# Patient Record
Sex: Male | Born: 1940 | Race: White | Hispanic: No | Marital: Married | Smoking: Never smoker
Health system: Southern US, Community
[De-identification: ages and names within clinical notes are randomized; demographics above are authoritative.]

## PROBLEM LIST (undated history)

## (undated) DIAGNOSIS — N400 Enlarged prostate without lower urinary tract symptoms: Secondary | ICD-10-CM

## (undated) DIAGNOSIS — C73 Malignant neoplasm of thyroid gland: Secondary | ICD-10-CM

## (undated) DIAGNOSIS — Z9289 Personal history of other medical treatment: Secondary | ICD-10-CM

## (undated) DIAGNOSIS — Z77098 Contact with and (suspected) exposure to other hazardous, chiefly nonmedicinal, chemicals: Secondary | ICD-10-CM

## (undated) DIAGNOSIS — C8590 Non-Hodgkin lymphoma, unspecified, unspecified site: Secondary | ICD-10-CM

## (undated) DIAGNOSIS — G4733 Obstructive sleep apnea (adult) (pediatric): Secondary | ICD-10-CM

## (undated) DIAGNOSIS — T884XXA Failed or difficult intubation, initial encounter: Secondary | ICD-10-CM

## (undated) DIAGNOSIS — I499 Cardiac arrhythmia, unspecified: Secondary | ICD-10-CM

## (undated) DIAGNOSIS — I059 Rheumatic mitral valve disease, unspecified: Secondary | ICD-10-CM

## (undated) DIAGNOSIS — Z7739 Contact with and (suspected) exposure to other war theater: Secondary | ICD-10-CM

## (undated) DIAGNOSIS — I4892 Unspecified atrial flutter: Secondary | ICD-10-CM

## (undated) DIAGNOSIS — E7800 Pure hypercholesterolemia, unspecified: Secondary | ICD-10-CM

## (undated) DIAGNOSIS — I712 Thoracic aortic aneurysm, without rupture: Secondary | ICD-10-CM

## (undated) DIAGNOSIS — R17 Unspecified jaundice: Secondary | ICD-10-CM

## (undated) DIAGNOSIS — I728 Aneurysm of other specified arteries: Secondary | ICD-10-CM

## (undated) DIAGNOSIS — G5793 Unspecified mononeuropathy of bilateral lower limbs: Secondary | ICD-10-CM

## (undated) DIAGNOSIS — I722 Aneurysm of renal artery: Secondary | ICD-10-CM

## (undated) DIAGNOSIS — R519 Headache, unspecified: Secondary | ICD-10-CM

## (undated) DIAGNOSIS — J189 Pneumonia, unspecified organism: Secondary | ICD-10-CM

## (undated) DIAGNOSIS — I7121 Aneurysm of the ascending aorta, without rupture: Secondary | ICD-10-CM

## (undated) DIAGNOSIS — R51 Headache: Secondary | ICD-10-CM

## (undated) DIAGNOSIS — I713 Abdominal aortic aneurysm, ruptured: Secondary | ICD-10-CM

## (undated) DIAGNOSIS — C801 Malignant (primary) neoplasm, unspecified: Secondary | ICD-10-CM

## (undated) DIAGNOSIS — G629 Polyneuropathy, unspecified: Secondary | ICD-10-CM

## (undated) HISTORY — DX: Headache, unspecified: R51.9

## (undated) HISTORY — DX: Benign prostatic hyperplasia without lower urinary tract symptoms: N40.0

## (undated) HISTORY — PX: COLONOSCOPY: SHX174

## (undated) HISTORY — DX: Polyneuropathy, unspecified: G62.9

## (undated) HISTORY — DX: Headache: R51

## (undated) HISTORY — PX: OTHER SURGICAL HISTORY: SHX169

## (undated) HISTORY — PX: ESOPHAGOGASTRODUODENOSCOPY: SHX1529

## (undated) HISTORY — DX: Obstructive sleep apnea (adult) (pediatric): G47.33

## (undated) HISTORY — DX: Rheumatic mitral valve disease, unspecified: I05.9

## (undated) HISTORY — PX: CHOLECYSTECTOMY: SHX55

## (undated) HISTORY — DX: Pure hypercholesterolemia, unspecified: E78.00

---
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
## 1998-08-09 ENCOUNTER — Ambulatory Visit (HOSPITAL_COMMUNITY): Admission: RE | Admit: 1998-08-09 | Discharge: 1998-08-09 | Payer: Self-pay | Admitting: Gastroenterology

## 2000-03-25 HISTORY — PX: MITRAL VALVE REPAIR: SHX2039

## 2000-07-22 ENCOUNTER — Ambulatory Visit (HOSPITAL_COMMUNITY): Admission: RE | Admit: 2000-07-22 | Discharge: 2000-07-22 | Payer: Self-pay | Admitting: Interventional Cardiology

## 2000-09-02 ENCOUNTER — Ambulatory Visit (HOSPITAL_COMMUNITY): Admission: RE | Admit: 2000-09-02 | Discharge: 2000-09-02 | Payer: Self-pay | Admitting: *Deleted

## 2001-01-20 ENCOUNTER — Encounter (HOSPITAL_COMMUNITY): Admission: RE | Admit: 2001-01-20 | Discharge: 2001-04-20 | Payer: Self-pay | Admitting: Interventional Cardiology

## 2002-04-06 ENCOUNTER — Encounter (HOSPITAL_COMMUNITY): Admission: RE | Admit: 2002-04-06 | Discharge: 2002-07-05 | Payer: Self-pay | Admitting: Interventional Cardiology

## 2002-07-24 ENCOUNTER — Encounter (HOSPITAL_COMMUNITY): Admission: RE | Admit: 2002-07-24 | Discharge: 2002-10-22 | Payer: Self-pay | Admitting: Interventional Cardiology

## 2002-10-24 ENCOUNTER — Encounter (HOSPITAL_COMMUNITY): Admission: RE | Admit: 2002-10-24 | Discharge: 2003-01-22 | Payer: Self-pay | Admitting: Interventional Cardiology

## 2003-01-24 ENCOUNTER — Encounter (HOSPITAL_COMMUNITY): Admission: RE | Admit: 2003-01-24 | Discharge: 2003-04-24 | Payer: Self-pay | Admitting: Interventional Cardiology

## 2003-04-26 ENCOUNTER — Encounter (HOSPITAL_COMMUNITY): Admission: RE | Admit: 2003-04-26 | Discharge: 2003-07-25 | Payer: Self-pay | Admitting: Interventional Cardiology

## 2003-07-26 ENCOUNTER — Encounter (HOSPITAL_COMMUNITY): Admission: RE | Admit: 2003-07-26 | Discharge: 2003-10-24 | Payer: Self-pay | Admitting: Interventional Cardiology

## 2003-10-25 ENCOUNTER — Encounter (HOSPITAL_COMMUNITY): Admission: RE | Admit: 2003-10-25 | Discharge: 2004-01-23 | Payer: Self-pay | Admitting: Interventional Cardiology

## 2004-01-24 ENCOUNTER — Encounter (HOSPITAL_COMMUNITY): Admission: RE | Admit: 2004-01-24 | Discharge: 2004-04-23 | Payer: Self-pay | Admitting: Interventional Cardiology

## 2004-02-14 ENCOUNTER — Emergency Department (HOSPITAL_COMMUNITY): Admission: EM | Admit: 2004-02-14 | Discharge: 2004-02-14 | Payer: Self-pay | Admitting: Emergency Medicine

## 2004-04-25 ENCOUNTER — Encounter (HOSPITAL_COMMUNITY): Admission: RE | Admit: 2004-04-25 | Discharge: 2004-07-24 | Payer: Self-pay | Admitting: Interventional Cardiology

## 2004-06-15 ENCOUNTER — Encounter: Admission: RE | Admit: 2004-06-15 | Discharge: 2004-06-15 | Payer: Self-pay | Admitting: Cardiology

## 2004-06-19 ENCOUNTER — Ambulatory Visit (HOSPITAL_COMMUNITY): Admission: RE | Admit: 2004-06-19 | Discharge: 2004-06-19 | Payer: Self-pay | Admitting: Cardiology

## 2004-06-19 HISTORY — PX: RIGHT HEART CATH: SHX6075

## 2004-07-25 ENCOUNTER — Encounter (HOSPITAL_COMMUNITY): Admission: RE | Admit: 2004-07-25 | Discharge: 2004-10-23 | Payer: Self-pay | Admitting: Interventional Cardiology

## 2004-10-24 ENCOUNTER — Encounter (HOSPITAL_COMMUNITY): Admission: RE | Admit: 2004-10-24 | Discharge: 2005-01-22 | Payer: Self-pay | Admitting: Interventional Cardiology

## 2005-01-23 ENCOUNTER — Encounter (HOSPITAL_COMMUNITY): Admission: RE | Admit: 2005-01-23 | Discharge: 2005-04-23 | Payer: Self-pay | Admitting: Internal Medicine

## 2005-04-25 ENCOUNTER — Encounter (HOSPITAL_COMMUNITY): Admission: RE | Admit: 2005-04-25 | Discharge: 2005-07-24 | Payer: Self-pay | Admitting: Interventional Cardiology

## 2005-07-25 ENCOUNTER — Encounter (HOSPITAL_COMMUNITY): Admission: RE | Admit: 2005-07-25 | Discharge: 2005-10-23 | Payer: Self-pay | Admitting: Cardiology

## 2005-10-24 ENCOUNTER — Encounter (HOSPITAL_COMMUNITY): Admission: RE | Admit: 2005-10-24 | Discharge: 2006-01-22 | Payer: Self-pay | Admitting: Cardiology

## 2006-01-23 ENCOUNTER — Encounter (HOSPITAL_COMMUNITY): Admission: RE | Admit: 2006-01-23 | Discharge: 2006-04-23 | Payer: Self-pay | Admitting: Cardiology

## 2006-04-25 ENCOUNTER — Encounter (HOSPITAL_COMMUNITY): Admission: RE | Admit: 2006-04-25 | Discharge: 2006-07-24 | Payer: Self-pay | Admitting: Cardiology

## 2006-07-22 HISTORY — PX: NM MYOVIEW LTD: HXRAD82

## 2006-07-25 ENCOUNTER — Encounter (HOSPITAL_COMMUNITY): Admission: RE | Admit: 2006-07-25 | Discharge: 2006-10-23 | Payer: Self-pay | Admitting: Cardiology

## 2006-10-24 ENCOUNTER — Encounter (HOSPITAL_COMMUNITY): Admission: RE | Admit: 2006-10-24 | Discharge: 2007-01-22 | Payer: Self-pay | Admitting: Cardiology

## 2007-01-24 ENCOUNTER — Encounter (HOSPITAL_COMMUNITY): Admission: RE | Admit: 2007-01-24 | Discharge: 2007-03-24 | Payer: Self-pay | Admitting: Cardiology

## 2007-03-26 HISTORY — PX: MENISCUS REPAIR: SHX5179

## 2008-09-01 ENCOUNTER — Encounter: Admission: RE | Admit: 2008-09-01 | Discharge: 2008-09-01 | Payer: Self-pay | Admitting: Internal Medicine

## 2008-11-12 ENCOUNTER — Encounter: Admission: RE | Admit: 2008-11-12 | Discharge: 2008-11-12 | Payer: Self-pay | Admitting: Internal Medicine

## 2009-11-08 ENCOUNTER — Ambulatory Visit (HOSPITAL_BASED_OUTPATIENT_CLINIC_OR_DEPARTMENT_OTHER): Admission: RE | Admit: 2009-11-08 | Discharge: 2009-11-08 | Payer: Self-pay | Admitting: Orthopedic Surgery

## 2010-06-08 LAB — POCT I-STAT, CHEM 8
BUN: 20 mg/dL (ref 6–23)
Calcium, Ion: 1.19 mmol/L (ref 1.12–1.32)
Chloride: 106 mEq/L (ref 96–112)
Creatinine, Ser: 1.1 mg/dL (ref 0.4–1.5)
Glucose, Bld: 88 mg/dL (ref 70–99)
HCT: 43 % (ref 39.0–52.0)
Hemoglobin: 14.6 g/dL (ref 13.0–17.0)
Potassium: 4 mEq/L (ref 3.5–5.1)
Sodium: 140 mEq/L (ref 135–145)
TCO2: 25 mmol/L (ref 0–100)

## 2010-08-10 NOTE — Cardiovascular Report (Signed)
Sean Stanley, Sean Stanley                 ACCOUNT NO.:  [id number]   MEDICAL RECORD NO.:  [id number]          PATIENT TYPE:  OIB   LOCATION:  2899                         FACILITY:  [facility]   PHYSICIAN:  Madaline Savage, M.D.DATE OF BIRTH:  [birth year]   DATE OF PROCEDURE:  06/19/2004  DATE OF DISCHARGE:                              CARDIAC CATHETERIZATION   PROCEDURES PERFORMED:  1.  Right heart catheterization.  2.  Retrograde left heart catheterization.  3.  Left ventricular angiography.  4.  Selective coronary angiography by Judkins technique.  5.  Thermodilution and Fick cardiac output determinations.   INTERVENTIONS:  None.   COMPLICATIONS:  None.   MEDICATIONS GIVEN:  Versed and Fentanyl for sedation   PATIENT PROFILE:  The patient is a very pleasant 70 year old retired  gentleman with a military background who has mitral valve disease and who  also underwent a minimally invasive mitral valve repair by Dr. Judd Gaudier  at [facility] in 2002.  His left ventricular ejection  fraction before surgery was 65-70%.  Following surgery, it measures 51% both  by echocardiography.  The patient is maintained on aspirin and Toprol, and  also doxazosin 4 mg a day.   The patient recently had a Cardiolite stress test.  The date of that  procedure was May 02, 2004. He had an ejection fraction calculation of  51%, and there was said to be mild inferior wall ischemia at the base.  There was also evidence of anterolateral wall ischemia from the mid  ventricle toward the apex. Because of the ejection fraction difference of 65  versus 51 and the abnormal Cardiolite results, I recommended cardiac  catheterization which was performed today electively on an outpatient basis  without complication.   RESULTS:  Pressures:  Left ventricular pressure was 130/5, end-diastolic  pressure 12, central aortic pressure 130/65, mean of 95.  Right atrial mean  pressure was 5.  A-wave  equal V-wave.  Right ventricular pressure 18/4, end-  diastolic pressure 7.  Pulmonary artery pressure 14/5, end-diastolic  pressure mean pressure 8. Pulmonary capillary wedge mean pressure was 6, A-  wave equal V-wave.  No aortic valve gradient by pullback technique.  The  mitral valve area will be calculated at a later time.   ANGIOGRAPHIC RESULTS:  The patient's left ventricular angiogram showed a low  normal contractility of all wall segments with no segmental wall motion  abnormalities seen.  The mitral valve was unremarkable in terms of absence  of calcifications. There was a smooth contour of the mitral valve outlined  in the 30 degrees RAO projection, and there was zero mitral regurgitation.   The left main coronary artery was large, medium in length and showed no  lesions.   The left anterior descending coronary artery coursed the cardiac apex with  some tortuosity in its mid portion. There was a bifurcating large diagonal  branch arising well before the first septal perforator branch and the LAD  and diagonal branch were both normal.   The circumflex coronary artery was nondominant but gave rise to a  posterolateral  branch and a large atrial circumflex branch. No lesions were  seen. There was a large intermediate ramus branch which bifurcated distally  and was normal.   The right coronary artery was dominant giving rise to a posterior descending  branch. It gave rise to one very large acute marginal branch. No lesions  were seen.   FINAL RESULTS:  1.  Angiographically patent coronary arteries. No coronary disease noted.  2.  False positive Cardiolite stress test.  3.  LVEF 50% estimated.  4.  Normal right heart hemodynamics.  5.  Normal cardiac output determinations.   PLAN:  Reassurance. The patient will recover for 4 hours and then be  discharged and will follow up in our office for further discussion of  results.      WHG/MEDQ  D:  06/19/2004  T:  06/19/2004   Job:  [id number]   cc:   Olene Craven, M.D.  [address]  [address]  [place]  [place] [address]  Fax: [phone]   [facility] Judd Gaudier M.D. Dept of CVTS   Madaline Savage, M.D.  [address], [address]  [place]  [place] [address]  Fax: [phone]

## 2010-08-10 NOTE — Cardiovascular Report (Signed)
[place]. [facility]  Patient:    Sean Stanley, Sean Stanley                        MRN: [id number] Proc. Date: 07/22/00 Adm. Date:  [id number] Attending:  Lyn Records. Iii                        Cardiac Catheterization  INDICATION:  Mitral regurgitation and left ventricular dysfunction.  PROCEDURES PERFORMED: 1. Left heart catheterization. 2. Right heart catheterization. 3. Coronary angiography. 4. Left ventriculography.  DESCRIPTION:  After informed consent, a 6-French sheath was placed in the right femoral artery and an 8-French sheath in the right femoral vein.  We experienced some difficulty cannulating the right femoral vein.  It appeared to be more medially than usually identified.  After local Xylocaine anesthesia, the catheterization was performed using a 7-French Swan-Ganz catheter, a 6-French angled pigtail catheter, a 6-French #4 left Judkins catheter, and a 6-French A2 multipurpose catheter.  The patient tolerated the procedure without significant problems.  Initially, right heart hemodynamics were determined followed by thermodilution cardiac outputs.  Simultaneous wedge LV pressure was then recorded.  A pullback pressure across the aortic valve was recorded after left ventriculography had been performed.  Left ventriculography was performed using the angled pigtail catheter in the 30-degree ROA projection at 12 cc of contrast per second for a total of 45 cc of contrast.  After pullback pressure across the aortic valve, the Swan-Ganz catheter was then pulled back to the right heart from wedge to inferior vena cava while recording.  The patient tolerated the procedure without complications after completion of coronary angiography.  RESULTS: I.   Hemodynamic data:      a. Aortic pressure 142/76 mmHg.      b. Left ventricular pressure 143/17 mmHg.      c. Right atrial mean pressure 10.      d. Right ventricular pressure 29/13.      e. Pulmonary  artery pressure mean 22 mmHg, phasic 30/15.      f. Pulmonary capillary wedge mean pressure 13 mmHg. II.  Left ventriculography:  The left ventricle is mildly dilated.  There      was mild global hypokinesis.  EF was estimated to be 55%.  A 2+ mitral      regurgitation is noted. III. Coronary angiography.      a. Left main coronary: Normal.      b. Left anterior descending coronary: The LAD is normal.  It is large         and gives origin to one diagonal that wraps around the left         ventricular apex.      c. Ramus branch: Large ramus branch arises from the left main and is         normal.      d. Circumflex artery: The circumflex artery is normal.      e. Right coronary:  The right coronary is normal.  CONCLUSIONS: 1. Moderate mitral regurgitation. 2. Low-normal left ventricular ejection fraction.  Wall motion is globally    reduced. 3. Normal coronary arteries.  RECOMMENDATIONS:  Continued medical therapy and clinical followup.  With normal right heart hemodynamics, it does not appear that surgery for mitral regurgitation is clearly indicated at this time. DD:  07/22/00 TD:  07/22/00 Job: [id number]

## 2010-08-10 NOTE — Procedures (Signed)
[place]. [facility]  Patient:    Sean Stanley, Sean Stanley                        MRN: [id number] Adm. Date:  [id number] Attending:  Meade Maw A CC:         Darci Needle, M.D.   Procedure Report  REFERRING PHYSICIAN:  Darci Needle, M.D.  INDICATION FOR PROCEDURE:  Evaluation of mitral regurgitation.  DESCRIPTION OF PROCEDURE:  After obtaining written informed consent, the patient was brought to the endoscopy lab in the postabsorptive state.  Topical anesthesia was achieved using viscous lidocaine and Cetacaine spray.  IV sedation was achieved using Versed 8 mg IV and fentanyl 100 mcg.  Following appropriate sedation, the omniplane probe was introduced using digital guidance without difficulty.  Multiple views were obtained of the midesophageal, basal, and deep gastric view.  The ascending and descending aortic arch were visualized.  Color flow Doppler was performed across the mitral, tricuspid, and aortic valve.  Bubble study was performed.  FINDINGS: 1. There was mild prolapse of the anterior and posterior leaflet.  There was    2+ mitral regurgitation, which was eccentrically anteriorly directed.  The    anterior jet did approach the level of the right pulmonary vein.  The    mitral inflow VTI was 19.8.  The aortic flow VTI was 19.1.  There was mild    left atrial enlargement. 2. Mild diffuse thickening of the aortic valve.  There was no significant    stenosis. 3. Grossly normal tricuspid valve. 4. The pulmonic valve was poorly visualized. 4. Grossly normal LV dimension and function, ejection fraction of    approximately 65-70%. 5. There was mild plaque noted in the ascending aorta. 6. The left atrial appendage was within normal limits.  Negative bubble study.  FINAL IMPRESSION: 1. Mild to moderate eccentrically-directed anterior mitral regurgitation    associated with mild prolapse of the anterior-posterior leaflet. The ratio    of the  mitral inflow with the aortic inflow VTI is approximately 1.  This  number is grossly normal. 2. Grossly normal left ventricular dimension and function, ejection fraction    of 65-70%. 3. Mild plaque in the ascending aorta. DD:  09/02/00 TD:  09/02/00 Job: [id number]

## 2011-01-15 ENCOUNTER — Encounter (INDEPENDENT_AMBULATORY_CARE_PROVIDER_SITE_OTHER): Payer: Medicare Other | Admitting: Ophthalmology

## 2011-01-15 DIAGNOSIS — H2510 Age-related nuclear cataract, unspecified eye: Secondary | ICD-10-CM

## 2011-01-15 DIAGNOSIS — H35419 Lattice degeneration of retina, unspecified eye: Secondary | ICD-10-CM

## 2011-01-15 DIAGNOSIS — H33309 Unspecified retinal break, unspecified eye: Secondary | ICD-10-CM

## 2011-01-15 DIAGNOSIS — H43819 Vitreous degeneration, unspecified eye: Secondary | ICD-10-CM

## 2011-03-29 DIAGNOSIS — M7750 Other enthesopathy of unspecified foot: Secondary | ICD-10-CM | POA: Diagnosis not present

## 2011-04-02 DIAGNOSIS — M659 Synovitis and tenosynovitis, unspecified: Secondary | ICD-10-CM | POA: Diagnosis not present

## 2011-04-02 DIAGNOSIS — M25579 Pain in unspecified ankle and joints of unspecified foot: Secondary | ICD-10-CM | POA: Diagnosis not present

## 2011-05-17 DIAGNOSIS — Z79899 Other long term (current) drug therapy: Secondary | ICD-10-CM | POA: Diagnosis not present

## 2011-05-17 DIAGNOSIS — E782 Mixed hyperlipidemia: Secondary | ICD-10-CM | POA: Diagnosis not present

## 2011-05-30 DIAGNOSIS — I10 Essential (primary) hypertension: Secondary | ICD-10-CM | POA: Diagnosis not present

## 2011-05-30 DIAGNOSIS — E782 Mixed hyperlipidemia: Secondary | ICD-10-CM | POA: Diagnosis not present

## 2011-06-12 DIAGNOSIS — M659 Synovitis and tenosynovitis, unspecified: Secondary | ICD-10-CM | POA: Diagnosis not present

## 2011-06-20 DIAGNOSIS — N400 Enlarged prostate without lower urinary tract symptoms: Secondary | ICD-10-CM | POA: Diagnosis not present

## 2011-06-20 DIAGNOSIS — N529 Male erectile dysfunction, unspecified: Secondary | ICD-10-CM | POA: Diagnosis not present

## 2011-09-17 DIAGNOSIS — H2510 Age-related nuclear cataract, unspecified eye: Secondary | ICD-10-CM | POA: Diagnosis not present

## 2011-09-17 DIAGNOSIS — H40029 Open angle with borderline findings, high risk, unspecified eye: Secondary | ICD-10-CM | POA: Diagnosis not present

## 2011-09-19 DIAGNOSIS — L255 Unspecified contact dermatitis due to plants, except food: Secondary | ICD-10-CM | POA: Diagnosis not present

## 2011-11-22 DIAGNOSIS — Z131 Encounter for screening for diabetes mellitus: Secondary | ICD-10-CM | POA: Diagnosis not present

## 2011-11-22 DIAGNOSIS — Z1331 Encounter for screening for depression: Secondary | ICD-10-CM | POA: Diagnosis not present

## 2011-11-22 DIAGNOSIS — Z0000 Encounter for general adult medical examination without abnormal findings: Secondary | ICD-10-CM | POA: Diagnosis not present

## 2012-01-03 DIAGNOSIS — Z23 Encounter for immunization: Secondary | ICD-10-CM | POA: Diagnosis not present

## 2012-01-16 ENCOUNTER — Encounter (INDEPENDENT_AMBULATORY_CARE_PROVIDER_SITE_OTHER): Payer: Medicare Other | Admitting: Ophthalmology

## 2012-01-16 DIAGNOSIS — H35419 Lattice degeneration of retina, unspecified eye: Secondary | ICD-10-CM | POA: Diagnosis not present

## 2012-01-16 DIAGNOSIS — H43819 Vitreous degeneration, unspecified eye: Secondary | ICD-10-CM

## 2012-01-16 DIAGNOSIS — H2510 Age-related nuclear cataract, unspecified eye: Secondary | ICD-10-CM | POA: Diagnosis not present

## 2012-01-16 DIAGNOSIS — H33309 Unspecified retinal break, unspecified eye: Secondary | ICD-10-CM | POA: Diagnosis not present

## 2012-01-30 ENCOUNTER — Ambulatory Visit (INDEPENDENT_AMBULATORY_CARE_PROVIDER_SITE_OTHER): Payer: Medicare Other | Admitting: Ophthalmology

## 2012-01-30 DIAGNOSIS — H33309 Unspecified retinal break, unspecified eye: Secondary | ICD-10-CM

## 2012-02-03 ENCOUNTER — Other Ambulatory Visit: Payer: Self-pay | Admitting: Internal Medicine

## 2012-02-03 DIAGNOSIS — E782 Mixed hyperlipidemia: Secondary | ICD-10-CM | POA: Diagnosis not present

## 2012-02-03 DIAGNOSIS — I059 Rheumatic mitral valve disease, unspecified: Secondary | ICD-10-CM | POA: Diagnosis not present

## 2012-02-03 DIAGNOSIS — I4891 Unspecified atrial fibrillation: Secondary | ICD-10-CM | POA: Diagnosis not present

## 2012-02-06 ENCOUNTER — Ambulatory Visit
Admission: RE | Admit: 2012-02-06 | Discharge: 2012-02-06 | Disposition: A | Discharge status: Home / Self Care | Payer: Medicare Other | Source: Ambulatory Visit | Attending: Cardiology | Admitting: Cardiology

## 2012-02-06 ENCOUNTER — Other Ambulatory Visit: Payer: Self-pay | Admitting: Cardiology

## 2012-02-06 DIAGNOSIS — Z01818 Encounter for other preprocedural examination: Secondary | ICD-10-CM | POA: Diagnosis not present

## 2012-02-06 DIAGNOSIS — R0602 Shortness of breath: Secondary | ICD-10-CM

## 2012-02-06 DIAGNOSIS — R002 Palpitations: Secondary | ICD-10-CM | POA: Diagnosis not present

## 2012-02-06 DIAGNOSIS — R5383 Other fatigue: Secondary | ICD-10-CM | POA: Diagnosis not present

## 2012-02-06 DIAGNOSIS — R5381 Other malaise: Secondary | ICD-10-CM | POA: Diagnosis not present

## 2012-02-06 DIAGNOSIS — J984 Other disorders of lung: Secondary | ICD-10-CM | POA: Diagnosis not present

## 2012-02-06 DIAGNOSIS — Z79899 Other long term (current) drug therapy: Secondary | ICD-10-CM | POA: Diagnosis not present

## 2012-02-07 ENCOUNTER — Other Ambulatory Visit: Payer: Self-pay | Admitting: Internal Medicine

## 2012-02-12 ENCOUNTER — Ambulatory Visit (HOSPITAL_COMMUNITY)
Admission: RE | Admit: 2012-02-12 | Discharge: 2012-02-12 | Disposition: A | Discharge status: Home / Self Care | Payer: Medicare Other | Source: Ambulatory Visit | Attending: Internal Medicine | Admitting: Internal Medicine

## 2012-02-12 ENCOUNTER — Ambulatory Visit (HOSPITAL_COMMUNITY): Payer: Medicare Other | Admitting: *Deleted

## 2012-02-12 ENCOUNTER — Encounter (HOSPITAL_COMMUNITY): Payer: Self-pay | Admitting: *Deleted

## 2012-02-12 ENCOUNTER — Encounter (HOSPITAL_COMMUNITY): Payer: Self-pay

## 2012-02-12 ENCOUNTER — Encounter (HOSPITAL_COMMUNITY)
Admission: RE | Disposition: A | Discharge status: Home / Self Care | Payer: Self-pay | Source: Ambulatory Visit | Attending: Internal Medicine

## 2012-02-12 DIAGNOSIS — I4891 Unspecified atrial fibrillation: Secondary | ICD-10-CM | POA: Diagnosis not present

## 2012-02-12 DIAGNOSIS — I4892 Unspecified atrial flutter: Secondary | ICD-10-CM | POA: Diagnosis not present

## 2012-02-12 HISTORY — DX: Pure hypercholesterolemia, unspecified: E78.00

## 2012-02-12 HISTORY — DX: Cardiac arrhythmia, unspecified: I49.9

## 2012-02-12 HISTORY — PX: TEE WITHOUT CARDIOVERSION: SHX5443

## 2012-02-12 HISTORY — DX: Unspecified atrial flutter: I48.92

## 2012-02-12 HISTORY — PX: CARDIOVERSION: SHX1299

## 2012-02-12 SURGERY — ECHOCARDIOGRAM, TRANSESOPHAGEAL
Anesthesia: Moderate Sedation

## 2012-02-12 MED ORDER — PROPOFOL 10 MG/ML IV BOLUS
INTRAVENOUS | Status: DC | PRN
  Administered 2012-02-12: 110 mg via INTRAVENOUS

## 2012-02-12 MED ORDER — LIDOCAINE HCL 2 % EX GEL
CUTANEOUS | Status: AC
  Filled 2012-02-12: qty 5

## 2012-02-12 MED ORDER — MIDAZOLAM HCL 5 MG/ML IJ SOLN
INTRAMUSCULAR | Status: AC
  Filled 2012-02-12: qty 2

## 2012-02-12 MED ORDER — LIDOCAINE VISCOUS 2 % MT SOLN
OROMUCOSAL | Status: AC
  Filled 2012-02-12: qty 15

## 2012-02-12 MED ORDER — MIDAZOLAM HCL 10 MG/2ML IJ SOLN
INTRAMUSCULAR | Status: DC | PRN
  Administered 2012-02-12: 1 mg via INTRAVENOUS
  Administered 2012-02-12 (×2): 2 mg via INTRAVENOUS

## 2012-02-12 MED ORDER — FENTANYL CITRATE 0.05 MG/ML IJ SOLN
INTRAMUSCULAR | Status: DC | PRN
  Administered 2012-02-12 (×2): 25 ug via INTRAVENOUS

## 2012-02-12 MED ORDER — FENTANYL CITRATE 0.05 MG/ML IJ SOLN
INTRAMUSCULAR | Status: AC
  Filled 2012-02-12: qty 2

## 2012-02-12 MED ORDER — BUTAMBEN-TETRACAINE-BENZOCAINE 2-2-14 % EX AERO
INHALATION_SPRAY | CUTANEOUS | Status: DC | PRN
  Administered 2012-02-12: 2 via TOPICAL

## 2012-02-12 MED ORDER — SODIUM CHLORIDE 0.9 % IV SOLN
INTRAVENOUS | Status: DC
  Administered 2012-02-12: 500 mL via INTRAVENOUS

## 2012-02-12 MED ORDER — LIDOCAINE VISCOUS 2 % MT SOLN
OROMUCOSAL | Status: DC | PRN
  Administered 2012-02-12: 20 mL via OROMUCOSAL

## 2012-02-12 NOTE — CV Procedure (Signed)
[facility]  TEE/CARDIOVERSION NOTE   TRANSESOPHAGEAL ECHOCARDIOGRAM (TEE):  Indictation: Atrial Flutter  Consent:   Informed consent was obtained prior to the procedure. The risks, benefits and alternatives for the procedure were discussed and the patient comprehended these risks.  Risks include, but are not limited to, cough, sore throat, vomiting, nausea, somnolence, esophageal and stomach trauma or perforation, bleeding, low blood pressure, aspiration, pneumonia, infection, trauma to the teeth and death.    Time Out: Verified patient identification, verified procedure, site/side was marked, verified correct patient position, special equipment/implants available, medications/allergies/relevent history reviewed, required imaging and test results available. Performed  Procedure:  After a procedural time-out, the patient was given 5 mg versed and 50 mcg fein structure and functntanyl for moderate sedation.  The oropharynx was anesthetized with 10 cc of topical 1% viscous lidocaine and 1 cetacaine spray. The transesophageal probe was inserted in the esophagus and stomach without difficulty and multiple views were obtained.  The patient was kept under observation until the patient left the procedure room.  The patient left the procedure room in stable condition.   Agitated microbubble saline contrast was not administered.  Complications:    Complications: None Patient did tolerate procedure well.  Findings:  1. LEFT VENTRICLE: There is mild global hypokinesis without thrombus or masses. LVEF is 45-50%.  2. RIGHT VENTRICLE:  The right ventricle is normal in structure and function without any thrombus or masses.     3. LEFT ATRIUM:  The left atrium is mildly dilated without any thrombus or masses.  4. LEFT ATRIAL APPENDAGE:  The left atrial appendage is unilobular and free of any thrombus or masses. Left atrial size is mildly dilated. Pulse doppler  indicates atrial flutter with low velocities.  5. ATRIAL SEPTUM:  The atrial septum is free of any thrombus or masses.  There is no evidence for interatrial shunting by color doppler.  6. RIGHT ATRIUM:  The right atrium is normal in size and function without any thrombus or masses.  7. MITRAL VALVE:  The mitral valve demonstrates an excellent annuloplasty ring repair which was well-visualized in 2D and 3D echo. There is only mild mitral regurgitation which is central.  There were no vegetations or stenosis.  8. AORTIC VALVE:  The aortic valve is normal in structure and function without regurgitation.  There were no vegetations or stenosis.  9. TRICUSPID VALVE:  The tricuspid valve is normal in structure and function with mild regurgitation.  There were no vegetations or stenosis  10.  PULMONIC VALVE:  The tricuspid valve is normal in structure and function without regurgitation.  There were no vegetations or stenosis.   11. AORTIC ARCH, ASCENDING AND DESCENDING AORTA:  There was no atherosclerosis of the descending or ascending aorta  CARDIOVERSION:     Time Out: Verified patient identification, verified procedure, site/side was marked, verified correct patient position, special equipment/implants available, medications/allergies/relevent history reviewed, required imaging and test results available.  Performed  Procedure:  1. Patient placed on cardiac monitor, pulse oximetry, supplemental oxygen as necessary.  2. Sedation administered per anesthesia 3. Pacer pads placed anterior and posterior chest. 4. Cardioverted 1 time(s).  5. Cardioverted at 150J.  Complications:  Complications: None Patient did tolerate procedure well.  Impression:  1. Mild global hypokinesis, LVEF 45-50%.  2. No LAA thrombus noted. Mild LAE. 3. Stable mitral annuloplasty repair with mild regurgitation. 4. 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 5. Successful cardioversion to sinus rhythm with  a single  biphasic shock at 150J.  Recommendations:  1. Continue xarelto. Will discuss with Dr. Salena Saner about anticoagulation. He appears to have had atypical flutter, ?if the flutter circuit is around the mitral valve repair. I'm not sure if xarelto is indicated in this situation if it is valvular or whether warfarin would be advised for long-term anticoagulation. Ok to d/c home today once awake and alert.  Time Spent Directly with the Patient:  60 minutes   [name], MD, [facility] Attending Cardiologist [facility]  02/12/2012, 1:08 PM

## 2012-02-12 NOTE — H&P (Signed)
     [facility]          INTERVAL PROCEDURE H&P   History and Physical Interval Note:  02/12/2012 12:42 PM  Sean Stanley has presented today for their planned procedure. The various methods of treatment have been discussed with the patient and family. After consideration of risks, benefits and other options for treatment, the patient has consented to the procedure.  The patients' outpatient history has been reviewed, patient examined, and no change in status from most recent office note within the past 30 days. I have reviewed the patients' chart and labs and will proceed as planned. Questions were answered to the patient's satisfaction.   Sean Nose, MD, [facility] Attending Cardiologist [facility]  Sean Stanley C 02/12/2012, 12:42 PM

## 2012-02-12 NOTE — Progress Notes (Signed)
  Echocardiogram Echocardiogram Transesophageal has been performed.  Margreta Journey 02/12/2012, 1:51 PM

## 2012-02-12 NOTE — Transfer of Care (Signed)
Immediate Anesthesia Transfer of Care Note  Patient: Sean Stanley  Procedure(s) Performed: Procedure(s) (LRB) with comments: TRANSESOPHAGEAL ECHOCARDIOGRAM (TEE) (N/A) CARDIOVERSION (N/A)  Patient Location: PACU  Anesthesia Type:MAC  Level of Consciousness: awake  Airway & Oxygen Therapy: Patient Spontanous Breathing and Patient connected to nasal cannula oxygen  Post-op Assessment: Report given to PACU RN and Post -op Vital signs reviewed and stable  Post vital signs: Reviewed and stable  Complications: No apparent anesthesia complications

## 2012-02-12 NOTE — Op Note (Signed)
Anesthesia in to sedate for cardioversion. 

## 2012-02-12 NOTE — Anesthesia Preprocedure Evaluation (Signed)
Anesthesia Evaluation  Patient identified by MRN, date of birth, ID band Patient awake    Reviewed: Allergy & Precautions, H&P , NPO status , Patient's Chart, lab work & pertinent test results, reviewed documented beta blocker date and time   Airway Mallampati: II TM Distance: >3 FB Neck ROM: Full    Dental  (+) Teeth Intact and Dental Advisory Given   Pulmonary          Cardiovascular hypertension, + dysrhythmias Atrial Fibrillation     Neuro/Psych    GI/Hepatic   Endo/Other    Renal/GU      Musculoskeletal   Abdominal   Peds  Hematology   Anesthesia Other Findings   Reproductive/Obstetrics                           Anesthesia Physical Anesthesia Plan  ASA: III  Anesthesia Plan: General   Post-op Pain Management:    Induction: Intravenous  Airway Management Planned: Mask  Additional Equipment:   Intra-op Plan:   Post-operative Plan:   Informed Consent: I have reviewed the patients History and Physical, chart, labs and discussed the procedure including the risks, benefits and alternatives for the proposed anesthesia with the patient or authorized representative who has indicated his/her understanding and acceptance.   Dental advisory given  Plan Discussed with: CRNA, Anesthesiologist and Surgeon  Anesthesia Plan Comments:         Anesthesia Quick Evaluation

## 2012-02-12 NOTE — Anesthesia Postprocedure Evaluation (Signed)
Anesthesia Post Note  Patient: Sean Stanley  Procedure(s) Performed: Procedure(s) (LRB): TRANSESOPHAGEAL ECHOCARDIOGRAM (TEE) (N/A) CARDIOVERSION (N/A)  Anesthesia type: general  Patient location: PACU  Post pain: Pain level controlled  Post assessment: Patient's Cardiovascular Status Stable  Last Vitals:  Filed Vitals:   02/12/12 1347  BP: 97/54  Temp:   Resp: 16    Post vital signs: Reviewed and stable  Level of consciousness: sedated  Complications: No apparent anesthesia complications

## 2012-02-13 ENCOUNTER — Encounter (HOSPITAL_COMMUNITY): Payer: Self-pay | Admitting: Internal Medicine

## 2012-02-17 DIAGNOSIS — I4891 Unspecified atrial fibrillation: Secondary | ICD-10-CM | POA: Diagnosis not present

## 2012-03-04 DIAGNOSIS — N529 Male erectile dysfunction, unspecified: Secondary | ICD-10-CM | POA: Diagnosis not present

## 2012-03-04 DIAGNOSIS — N281 Cyst of kidney, acquired: Secondary | ICD-10-CM | POA: Diagnosis not present

## 2012-03-04 DIAGNOSIS — N400 Enlarged prostate without lower urinary tract symptoms: Secondary | ICD-10-CM | POA: Diagnosis not present

## 2012-03-04 DIAGNOSIS — R319 Hematuria, unspecified: Secondary | ICD-10-CM | POA: Diagnosis not present

## 2012-03-04 DIAGNOSIS — R3129 Other microscopic hematuria: Secondary | ICD-10-CM | POA: Diagnosis not present

## 2012-03-12 DIAGNOSIS — R1031 Right lower quadrant pain: Secondary | ICD-10-CM | POA: Diagnosis not present

## 2012-03-12 DIAGNOSIS — N529 Male erectile dysfunction, unspecified: Secondary | ICD-10-CM | POA: Diagnosis not present

## 2012-03-12 DIAGNOSIS — N400 Enlarged prostate without lower urinary tract symptoms: Secondary | ICD-10-CM | POA: Diagnosis not present

## 2012-03-12 DIAGNOSIS — R319 Hematuria, unspecified: Secondary | ICD-10-CM | POA: Diagnosis not present

## 2012-03-24 DIAGNOSIS — I4891 Unspecified atrial fibrillation: Secondary | ICD-10-CM | POA: Diagnosis not present

## 2012-03-25 DIAGNOSIS — I713 Abdominal aortic aneurysm, ruptured, unspecified: Secondary | ICD-10-CM

## 2012-03-25 DIAGNOSIS — I728 Aneurysm of other specified arteries: Secondary | ICD-10-CM

## 2012-03-25 HISTORY — DX: Abdominal aortic aneurysm, ruptured, unspecified: I71.30

## 2012-03-25 HISTORY — DX: Abdominal aortic aneurysm, ruptured: I71.3

## 2012-03-25 HISTORY — DX: Aneurysm of other specified arteries: I72.8

## 2012-04-21 DIAGNOSIS — H4011X0 Primary open-angle glaucoma, stage unspecified: Secondary | ICD-10-CM | POA: Diagnosis not present

## 2012-05-22 DIAGNOSIS — H4011X0 Primary open-angle glaucoma, stage unspecified: Secondary | ICD-10-CM | POA: Diagnosis not present

## 2012-05-29 ENCOUNTER — Ambulatory Visit (INDEPENDENT_AMBULATORY_CARE_PROVIDER_SITE_OTHER): Payer: Medicare Other | Admitting: Ophthalmology

## 2012-06-03 ENCOUNTER — Ambulatory Visit (INDEPENDENT_AMBULATORY_CARE_PROVIDER_SITE_OTHER): Payer: Medicare Other | Admitting: Ophthalmology

## 2012-06-03 DIAGNOSIS — H2510 Age-related nuclear cataract, unspecified eye: Secondary | ICD-10-CM

## 2012-06-03 DIAGNOSIS — H43819 Vitreous degeneration, unspecified eye: Secondary | ICD-10-CM | POA: Diagnosis not present

## 2012-06-03 DIAGNOSIS — H33309 Unspecified retinal break, unspecified eye: Secondary | ICD-10-CM | POA: Diagnosis not present

## 2012-06-19 DIAGNOSIS — R3915 Urgency of urination: Secondary | ICD-10-CM | POA: Diagnosis not present

## 2012-06-19 DIAGNOSIS — N529 Male erectile dysfunction, unspecified: Secondary | ICD-10-CM | POA: Diagnosis not present

## 2012-06-19 DIAGNOSIS — R1031 Right lower quadrant pain: Secondary | ICD-10-CM | POA: Diagnosis not present

## 2012-06-19 DIAGNOSIS — N400 Enlarged prostate without lower urinary tract symptoms: Secondary | ICD-10-CM | POA: Diagnosis not present

## 2012-07-16 DIAGNOSIS — H01029 Squamous blepharitis unspecified eye, unspecified eyelid: Secondary | ICD-10-CM | POA: Diagnosis not present

## 2012-07-20 DIAGNOSIS — H00019 Hordeolum externum unspecified eye, unspecified eyelid: Secondary | ICD-10-CM | POA: Diagnosis not present

## 2012-07-22 DIAGNOSIS — R079 Chest pain, unspecified: Secondary | ICD-10-CM | POA: Diagnosis not present

## 2012-07-22 DIAGNOSIS — E782 Mixed hyperlipidemia: Secondary | ICD-10-CM | POA: Diagnosis not present

## 2012-07-22 DIAGNOSIS — R1011 Right upper quadrant pain: Secondary | ICD-10-CM | POA: Diagnosis not present

## 2012-07-24 ENCOUNTER — Encounter: Payer: Self-pay | Admitting: *Deleted

## 2012-07-27 ENCOUNTER — Encounter: Payer: Self-pay | Admitting: Cardiovascular Disease

## 2012-08-04 ENCOUNTER — Emergency Department (HOSPITAL_COMMUNITY): Payer: Medicare Other

## 2012-08-04 ENCOUNTER — Inpatient Hospital Stay (HOSPITAL_BASED_OUTPATIENT_CLINIC_OR_DEPARTMENT_OTHER)
Admission: EM | Admit: 2012-08-04 | Discharge: 2012-08-11 | DRG: 299 | Disposition: A | Discharge status: Home / Self Care | Payer: Medicare Other | Attending: Pulmonary Disease | Admitting: Pulmonary Disease

## 2012-08-04 ENCOUNTER — Emergency Department (HOSPITAL_BASED_OUTPATIENT_CLINIC_OR_DEPARTMENT_OTHER): Payer: Medicare Other

## 2012-08-04 ENCOUNTER — Encounter (HOSPITAL_BASED_OUTPATIENT_CLINIC_OR_DEPARTMENT_OTHER): Payer: Self-pay

## 2012-08-04 ENCOUNTER — Inpatient Hospital Stay (HOSPITAL_COMMUNITY): Payer: Medicare Other

## 2012-08-04 DIAGNOSIS — K661 Hemoperitoneum: Secondary | ICD-10-CM | POA: Diagnosis present

## 2012-08-04 DIAGNOSIS — D62 Acute posthemorrhagic anemia: Secondary | ICD-10-CM | POA: Diagnosis not present

## 2012-08-04 DIAGNOSIS — I4892 Unspecified atrial flutter: Secondary | ICD-10-CM | POA: Diagnosis not present

## 2012-08-04 DIAGNOSIS — I472 Ventricular tachycardia, unspecified: Secondary | ICD-10-CM | POA: Diagnosis not present

## 2012-08-04 DIAGNOSIS — I6529 Occlusion and stenosis of unspecified carotid artery: Secondary | ICD-10-CM | POA: Diagnosis present

## 2012-08-04 DIAGNOSIS — D689 Coagulation defect, unspecified: Secondary | ICD-10-CM | POA: Diagnosis present

## 2012-08-04 DIAGNOSIS — E872 Acidosis, unspecified: Secondary | ICD-10-CM | POA: Diagnosis present

## 2012-08-04 DIAGNOSIS — I959 Hypotension, unspecified: Secondary | ICD-10-CM | POA: Diagnosis not present

## 2012-08-04 DIAGNOSIS — K5900 Constipation, unspecified: Secondary | ICD-10-CM | POA: Diagnosis not present

## 2012-08-04 DIAGNOSIS — R0609 Other forms of dyspnea: Secondary | ICD-10-CM | POA: Diagnosis not present

## 2012-08-04 DIAGNOSIS — R001 Bradycardia, unspecified: Secondary | ICD-10-CM | POA: Diagnosis present

## 2012-08-04 DIAGNOSIS — E7800 Pure hypercholesterolemia, unspecified: Secondary | ICD-10-CM | POA: Diagnosis present

## 2012-08-04 DIAGNOSIS — R112 Nausea with vomiting, unspecified: Secondary | ICD-10-CM | POA: Diagnosis not present

## 2012-08-04 DIAGNOSIS — H409 Unspecified glaucoma: Secondary | ICD-10-CM | POA: Diagnosis present

## 2012-08-04 DIAGNOSIS — N170 Acute kidney failure with tubular necrosis: Secondary | ICD-10-CM | POA: Diagnosis present

## 2012-08-04 DIAGNOSIS — I4891 Unspecified atrial fibrillation: Secondary | ICD-10-CM | POA: Diagnosis present

## 2012-08-04 DIAGNOSIS — S301XXA Contusion of abdominal wall, initial encounter: Secondary | ICD-10-CM | POA: Diagnosis not present

## 2012-08-04 DIAGNOSIS — R109 Unspecified abdominal pain: Secondary | ICD-10-CM | POA: Diagnosis not present

## 2012-08-04 DIAGNOSIS — Z91041 Radiographic dye allergy status: Secondary | ICD-10-CM | POA: Diagnosis present

## 2012-08-04 DIAGNOSIS — R6889 Other general symptoms and signs: Secondary | ICD-10-CM | POA: Diagnosis not present

## 2012-08-04 DIAGNOSIS — D68318 Other hemorrhagic disorder due to intrinsic circulating anticoagulants, antibodies, or inhibitors: Secondary | ICD-10-CM | POA: Diagnosis not present

## 2012-08-04 DIAGNOSIS — E785 Hyperlipidemia, unspecified: Secondary | ICD-10-CM | POA: Diagnosis present

## 2012-08-04 DIAGNOSIS — T794XXA Traumatic shock, initial encounter: Secondary | ICD-10-CM | POA: Diagnosis present

## 2012-08-04 DIAGNOSIS — R58 Hemorrhage, not elsewhere classified: Secondary | ICD-10-CM

## 2012-08-04 DIAGNOSIS — R578 Other shock: Secondary | ICD-10-CM | POA: Diagnosis present

## 2012-08-04 DIAGNOSIS — I739 Peripheral vascular disease, unspecified: Secondary | ICD-10-CM | POA: Diagnosis present

## 2012-08-04 DIAGNOSIS — Z452 Encounter for adjustment and management of vascular access device: Secondary | ICD-10-CM | POA: Diagnosis not present

## 2012-08-04 DIAGNOSIS — N289 Disorder of kidney and ureter, unspecified: Secondary | ICD-10-CM | POA: Diagnosis not present

## 2012-08-04 DIAGNOSIS — I728 Aneurysm of other specified arteries: Principal | ICD-10-CM | POA: Diagnosis present

## 2012-08-04 DIAGNOSIS — K922 Gastrointestinal hemorrhage, unspecified: Secondary | ICD-10-CM | POA: Diagnosis present

## 2012-08-04 DIAGNOSIS — I10 Essential (primary) hypertension: Secondary | ICD-10-CM | POA: Diagnosis present

## 2012-08-04 DIAGNOSIS — I7389 Other specified peripheral vascular diseases: Secondary | ICD-10-CM | POA: Diagnosis present

## 2012-08-04 DIAGNOSIS — R791 Abnormal coagulation profile: Secondary | ICD-10-CM | POA: Diagnosis present

## 2012-08-04 DIAGNOSIS — I4729 Other ventricular tachycardia: Secondary | ICD-10-CM | POA: Diagnosis not present

## 2012-08-04 DIAGNOSIS — I498 Other specified cardiac arrhythmias: Secondary | ICD-10-CM | POA: Diagnosis not present

## 2012-08-04 DIAGNOSIS — Z7901 Long term (current) use of anticoagulants: Secondary | ICD-10-CM | POA: Diagnosis not present

## 2012-08-04 DIAGNOSIS — IMO0001 Reserved for inherently not codable concepts without codable children: Secondary | ICD-10-CM

## 2012-08-04 DIAGNOSIS — Z4682 Encounter for fitting and adjustment of non-vascular catheter: Secondary | ICD-10-CM | POA: Diagnosis not present

## 2012-08-04 DIAGNOSIS — K2970 Gastritis, unspecified, without bleeding: Secondary | ICD-10-CM | POA: Diagnosis not present

## 2012-08-04 DIAGNOSIS — R918 Other nonspecific abnormal finding of lung field: Secondary | ICD-10-CM | POA: Diagnosis not present

## 2012-08-04 DIAGNOSIS — T45515A Adverse effect of anticoagulants, initial encounter: Secondary | ICD-10-CM | POA: Diagnosis present

## 2012-08-04 DIAGNOSIS — I059 Rheumatic mitral valve disease, unspecified: Secondary | ICD-10-CM | POA: Diagnosis not present

## 2012-08-04 LAB — COMPREHENSIVE METABOLIC PANEL
ALT: 12 U/L (ref 0–53)
ALT: 9 U/L (ref 0–53)
ALT: 9 U/L (ref 0–53)
AST: 17 U/L (ref 0–37)
AST: 12 U/L (ref 0–37)
Albumin: 2.4 g/dL — ABNORMAL LOW (ref 3.5–5.2)
Alkaline Phosphatase: 58 U/L (ref 39–117)
Alkaline Phosphatase: 44 U/L (ref 39–117)
CO2: 21 mEq/L (ref 19–32)
Calcium: 7.2 mg/dL — ABNORMAL LOW (ref 8.4–10.5)
Chloride: 103 mEq/L (ref 96–112)
Chloride: 106 mEq/L (ref 96–112)
Creatinine, Ser: 0.98 mg/dL (ref 0.50–1.35)
GFR calc Af Amer: 76 mL/min — ABNORMAL LOW (ref >90)
GFR calc Af Amer: >90 mL/min (ref >90)
GFR calc non Af Amer: 66 mL/min — ABNORMAL LOW (ref >90)
Glucose, Bld: 151 mg/dL — ABNORMAL HIGH (ref 70–99)
Glucose, Bld: 192 mg/dL — ABNORMAL HIGH (ref 70–99)
Glucose, Bld: 248 mg/dL — ABNORMAL HIGH (ref 70–99)
Potassium: 3.6 mEq/L (ref 3.5–5.1)
Sodium: 137 mEq/L (ref 135–145)
Sodium: 137 mEq/L (ref 135–145)
Sodium: 137 mEq/L (ref 135–145)
Total Bilirubin: 1.8 mg/dL — ABNORMAL HIGH (ref 0.3–1.2)
Total Protein: 4.6 g/dL — ABNORMAL LOW (ref 6.0–8.3)
Total Protein: 4.1 g/dL — ABNORMAL LOW (ref 6.0–8.3)

## 2012-08-04 LAB — POCT I-STAT 3, ART BLOOD GAS (G3+)
Acid-base deficit: 11 mmol/L — ABNORMAL HIGH (ref 0.0–2.0)
Bicarbonate: 15.1 mEq/L — ABNORMAL LOW (ref 20.0–24.0)
Patient temperature: 36
pH, Arterial: 7.295 — ABNORMAL LOW (ref 7.350–7.450)

## 2012-08-04 LAB — LACTIC ACID, PLASMA: Lactic Acid, Venous: 7.1 mmol/L — ABNORMAL HIGH (ref 0.5–2.2)

## 2012-08-04 LAB — CBC WITH DIFFERENTIAL/PLATELET
Basophils Absolute: 0 10*3/uL (ref 0.0–0.1)
Basophils Absolute: 0 10*3/uL (ref 0.0–0.1)
Basophils Absolute: 0 10*3/uL (ref 0.0–0.1)
Basophils Relative: 0 % (ref 0–1)
Basophils Relative: 0 % (ref 0–1)
Eosinophils Absolute: 0 10*3/uL (ref 0.0–0.7)
Eosinophils Absolute: 0.1 10*3/uL (ref 0.0–0.7)
Eosinophils Absolute: 0 10*3/uL (ref 0.0–0.7)
Eosinophils Relative: 0 % (ref 0–5)
Hemoglobin: 10.6 g/dL — ABNORMAL LOW (ref 13.0–17.0)
Lymphocytes Relative: 6 % — ABNORMAL LOW (ref 12–46)
MCH: 30.1 pg (ref 26.0–34.0)
MCH: 30.3 pg (ref 26.0–34.0)
MCHC: 34.2 g/dL (ref 30.0–36.0)
MCHC: 33.1 g/dL (ref 30.0–36.0)
MCV: 86.9 fL (ref 78.0–100.0)
Monocytes Absolute: 1 10*3/uL (ref 0.1–1.0)
Monocytes Relative: 7 % (ref 3–12)
Monocytes Relative: 7 % (ref 3–12)
Neutrophils Relative %: 82 % — ABNORMAL HIGH (ref 43–77)
Neutrophils Relative %: 87 % — ABNORMAL HIGH (ref 43–77)
Platelets: 206 10*3/uL (ref 150–400)
Platelets: 144 10*3/uL — ABNORMAL LOW (ref 150–400)
RDW: 12.9 % (ref 11.5–15.5)
RDW: 13.8 % (ref 11.5–15.5)
WBC: 15.6 10*3/uL — ABNORMAL HIGH (ref 4.0–10.5)

## 2012-08-04 LAB — URINALYSIS, ROUTINE W REFLEX MICROSCOPIC
Leukocytes, UA: NEGATIVE
Nitrite: NEGATIVE
Specific Gravity, Urine: 1.025 (ref 1.005–1.030)
pH: 6 (ref 5.0–8.0)

## 2012-08-04 LAB — POCT I-STAT TROPONIN I: Troponin i, poc: 0.03 ng/mL (ref 0.00–0.08)

## 2012-08-04 LAB — CG4 I-STAT (LACTIC ACID): Lactic Acid, Venous: 4.34 mmol/L — ABNORMAL HIGH (ref 0.5–2.2)

## 2012-08-04 LAB — CBC
HCT: 21.9 % — ABNORMAL LOW (ref 39.0–52.0)
MCHC: 35.6 g/dL (ref 30.0–36.0)
Platelets: 138 10*3/uL — ABNORMAL LOW (ref 150–400)
RDW: 14.2 % (ref 11.5–15.5)
WBC: 11.8 10*3/uL — ABNORMAL HIGH (ref 4.0–10.5)

## 2012-08-04 LAB — ABO/RH: ABO/RH(D): A POS

## 2012-08-04 LAB — URINE MICROSCOPIC-ADD ON

## 2012-08-04 LAB — PREPARE RBC (CROSSMATCH)

## 2012-08-04 LAB — MAGNESIUM: Magnesium: 1.5 mg/dL (ref 1.5–2.5)

## 2012-08-04 LAB — PROTIME-INR
INR: 1.09 (ref 0.00–1.49)
Prothrombin Time: 14 seconds (ref 11.6–15.2)

## 2012-08-04 MED ORDER — SUCCINYLCHOLINE CHLORIDE 20 MG/ML IJ SOLN
INTRAMUSCULAR | Status: AC
  Filled 2012-08-04: qty 1

## 2012-08-04 MED ORDER — FENTANYL CITRATE 0.05 MG/ML IJ SOLN
50.0000 ug | Freq: Once | INTRAMUSCULAR | Status: AC
  Administered 2012-08-04: 50 ug via INTRAVENOUS

## 2012-08-04 MED ORDER — METHYLPREDNISOLONE SODIUM SUCC 125 MG IJ SOLR
125.0000 mg | INTRAMUSCULAR | Status: AC
  Administered 2012-08-04: 125 mg via INTRAVENOUS
  Filled 2012-08-04: qty 2

## 2012-08-04 MED ORDER — FENTANYL CITRATE 0.05 MG/ML IJ SOLN
INTRAMUSCULAR | Status: AC
  Filled 2012-08-04: qty 2

## 2012-08-04 MED ORDER — METHYLPREDNISOLONE SODIUM SUCC 125 MG IJ SOLR
125.0000 mg | INTRAMUSCULAR | Status: AC
  Filled 2012-08-04: qty 2

## 2012-08-04 MED ORDER — FAMOTIDINE IN NACL 20-0.9 MG/50ML-% IV SOLN
20.0000 mg | INTRAVENOUS | Status: AC
  Administered 2012-08-04: 20 mg via INTRAVENOUS
  Filled 2012-08-04: qty 50

## 2012-08-04 MED ORDER — GLUCAGON HCL (RDNA) 1 MG IJ SOLR
21.0000 mg | INTRAVENOUS | Status: DC
  Administered 2012-08-04: 21 mg via INTRAVENOUS
  Filled 2012-08-04 (×3): qty 21

## 2012-08-04 MED ORDER — FENTANYL CITRATE 0.05 MG/ML IJ SOLN
25.0000 ug | INTRAMUSCULAR | Status: DC | PRN
  Administered 2012-08-04 – 2012-08-08 (×15): 100 ug via INTRAVENOUS
  Filled 2012-08-04 (×18): qty 2

## 2012-08-04 MED ORDER — DIPHENHYDRAMINE HCL 50 MG/ML IJ SOLN: 25.0000 mg | INTRAMUSCULAR | Status: AC

## 2012-08-04 MED ORDER — PANTOPRAZOLE SODIUM 40 MG IV SOLR
40.0000 mg | INTRAVENOUS | Status: DC
  Administered 2012-08-04 – 2012-08-06 (×3): 40 mg via INTRAVENOUS
  Filled 2012-08-04 (×5): qty 40

## 2012-08-04 MED ORDER — IOHEXOL 350 MG/ML SOLN
100.0000 mL | Freq: Once | INTRAVENOUS | Status: AC | PRN
  Administered 2012-08-04: 100 mL via INTRAVENOUS

## 2012-08-04 MED ORDER — DIPHENHYDRAMINE HCL 50 MG/ML IJ SOLN
25.0000 mg | INTRAMUSCULAR | Status: AC
  Administered 2012-08-04: 25 mg via INTRAVENOUS
  Filled 2012-08-04: qty 1

## 2012-08-04 MED ORDER — FENTANYL CITRATE 0.05 MG/ML IJ SOLN
25.0000 ug | Freq: Once | INTRAMUSCULAR | Status: AC
  Administered 2012-08-04: 25 ug via INTRAVENOUS

## 2012-08-04 MED ORDER — ETOMIDATE 2 MG/ML IV SOLN
INTRAVENOUS | Status: AC
  Filled 2012-08-04: qty 20

## 2012-08-04 MED ORDER — FAMOTIDINE IN NACL 20-0.9 MG/50ML-% IV SOLN
20.0000 mg | INTRAVENOUS | Status: AC
  Filled 2012-08-04: qty 50

## 2012-08-04 MED ORDER — EMPTY CONTAINERS FLEXIBLE MISC
2531.0000 [IU] | Status: AC
  Administered 2012-08-04: 2531 [IU] via INTRAVENOUS
  Filled 2012-08-04: qty 2531

## 2012-08-04 MED ORDER — ROCURONIUM BROMIDE 50 MG/5ML IV SOLN
INTRAVENOUS | Status: AC
  Filled 2012-08-04: qty 2

## 2012-08-04 MED ORDER — ONDANSETRON HCL 4 MG/2ML IJ SOLN
INTRAMUSCULAR | Status: AC
  Administered 2012-08-04: 4 mg via INTRAVENOUS
  Filled 2012-08-04: qty 2

## 2012-08-04 MED ORDER — LIDOCAINE HCL (CARDIAC) 20 MG/ML IV SOLN
INTRAVENOUS | Status: AC
  Filled 2012-08-04: qty 5

## 2012-08-04 MED ORDER — FENTANYL CITRATE 0.05 MG/ML IJ SOLN
100.0000 ug | INTRAMUSCULAR | Status: DC | PRN
  Administered 2012-08-04 – 2012-08-06 (×8): 100 ug via INTRAVENOUS
  Filled 2012-08-04: qty 4
  Filled 2012-08-04 (×3): qty 2

## 2012-08-04 MED ORDER — FENTANYL CITRATE 0.05 MG/ML IJ SOLN
INTRAMUSCULAR | Status: AC
  Administered 2012-08-04: 100 ug
  Filled 2012-08-04: qty 4

## 2012-08-04 MED ORDER — ONDANSETRON HCL 4 MG/2ML IJ SOLN
4.0000 mg | Freq: Four times a day (QID) | INTRAMUSCULAR | Status: DC | PRN
  Administered 2012-08-05: 4 mg via INTRAVENOUS
  Filled 2012-08-04: qty 2

## 2012-08-04 NOTE — ED Notes (Addendum)
Emergency release blood Blood transfuion # 1 [id number] completed at 1520 Blood transfusion # 2 [id number] completed at 1524 Blood transfuion # 3 [id number] completed at 1548

## 2012-08-04 NOTE — ED Notes (Signed)
Dr. Judd Lien at bedside, pt now noted more pale, more diaphoretic, moaning and not answering questions. bp rechecked, per monitor bp is now 66/43. Manual bp checked by this rn, manual reading 52/35.

## 2012-08-04 NOTE — ED Notes (Signed)
Report called to Martie Lee, Geophysicist/field seismologist Nurse [facility]

## 2012-08-04 NOTE — Progress Notes (Signed)
Chaplain was already in ED. Met pt's wife who was inquiring about something to eat. I got a bag lunch for her and for pt's sister. Then visited with them and with pt in Trauma C, providing emotional and spiritual support. Pt will soon be going to 2300.

## 2012-08-04 NOTE — Consult Note (Signed)
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  Patient was accepted by [facility] for transfer from [facility].  On my exam in the ED he has stabilized hemodynamically.  He is receiving blood product transfusion. This will include coagulation factor replacement, fresh frozen plasma, packed red blood cells. He is significantly tender over the area of hemorrhage. Plan was discussed in detail with Dr.Brabham from [facility] and decision was made to consult critical care medicine. Dr. Molli Knock is at the patient's bedside and making plans for possible endotracheal intubation. He will be admitted to CCM in the ICU.  If the bleeding should continue and/or he develops bowel compromise due to mesenteric hemorrhage, he may need exploratory laparotomy. An additional option may be interventional radiology with selective embolization of the gastroduodenal artery. Dr. Myra Gianotti discussed this with IR.  Blood pressure has improved since resuscitation. I have also discussed this case with my partner, Dr. Derrell Lolling. We will follow very closely. The plan was discussed in detail with the patient. Patient examined and I agree with the assessment and plan  Violeta Gelinas, MD, MPH, FACS Pager: [phone]  08/04/2012 5:10 PM

## 2012-08-04 NOTE — ED Notes (Addendum)
MD at bedside. Yao 

## 2012-08-04 NOTE — Procedures (Signed)
Central Venous Catheter Insertion Procedure Note REVAN GENDRON [id number] 1940/08/23  Procedure: Insertion of Central Venous Catheter Indications: Drug and/or fluid administration  Procedure Details Consent: Risks of procedure as well as the alternatives and risks of each were explained to the (patient/caregiver).  Consent for procedure obtained. Time Out: Verified patient identification, verified procedure, site/side was marked, verified correct patient position, special equipment/implants available, medications/allergies/relevent history reviewed, required imaging and test results available.  Performed  Maximum sterile technique was used including antiseptics, cap, gloves, gown, hand hygiene, mask and sheet. Skin prep: Chlorhexidine; local anesthetic administered A antimicrobial bonded/coated single lumen catheter was placed in the right internal jugular vein using the Seldinger technique.  Evaluation Blood flow good Complications: No apparent complications Patient did tolerate procedure well. Chest X-ray ordered to verify placement.  CXR: pending.  U/S used in placement.  YACOUB,WESAM 08/04/2012, 4:42 PM

## 2012-08-04 NOTE — ED Notes (Signed)
Family remains at bedside.

## 2012-08-04 NOTE — ED Notes (Signed)
Pt has had 2250 NS prior to arrival to ED. 2 units of o negative running in at this time

## 2012-08-04 NOTE — ED Notes (Signed)
Patient transported to CT by RN on monitor. After CT, patient will be transported to 2311 on monitor by RN. Report already given to floor.

## 2012-08-04 NOTE — ED Notes (Signed)
Patient placed on bairhugger.

## 2012-08-04 NOTE — ED Provider Notes (Signed)
Patient transferred from med center. Had abdominal pain and became hypotensive to 60s. 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 He is on xarelto but no asa or plavix. Gen surg and vascular surg called by Dr. Judd Lien. I saw patient here. His BP here was 100/79. He received 2.5L NS and is currently getting 2 U O neg blood. Gen surg at bedside. I ordered more O neg blood and fluid. I also called Vascular surgery to come and assess the patient.   3:58 PM Gen surg and vascular saw patient. Will admit.     Date: 08/04/2012  Rate: 74  Rhythm: normal sinus rhythm  QRS Axis: normal  Intervals: QT prolonged  ST/T Wave abnormalities: normal  Conduction Disutrbances:none  Narrative Interpretation:   Old EKG Reviewed: unchanged    CRITICAL CARE Performed by: Silverio Lay, DAVID   Total critical care time: 30 min   Critical care time was exclusive of separately billable procedures and treating other patients.  Critical care was necessary to treat or prevent imminent or life-threatening deterioration.  Critical care was time spent personally by me on the following activities: development of treatment plan with patient and/or surrogate as well as nursing, discussions with consultants, evaluation of patient's response to treatment, examination of patient, obtaining history from patient or surrogate, ordering and performing treatments and interventions, ordering and review of laboratory studies, ordering and review of radiographic studies, pulse oximetry and re-evaluation of patient's condition.   Richardean Canal, MD 08/04/12 [id number]

## 2012-08-04 NOTE — ED Notes (Signed)
NP  Critical care at bedside.

## 2012-08-04 NOTE — ED Notes (Signed)
Critical care NP placing central line at this time

## 2012-08-04 NOTE — Consult Note (Signed)
[facility] [place]      Consult Note  Patient name: Sean Stanley MRN: [id number] DOB: May 16, 1940 Sex: male  Consulting Physician:  Emergency department  Reason for Consult:  Chief Complaint  Patient presents with  . Nausea  . Emesis    HISTORY OF PRESENT ILLNESS: This is a 72 year old gentleman who presented to the med center [place] earlier this afternoon with complaints of severe abdominal pain. He stated that this pain began this morning. He was diaphoretic on arrival to [facility]. A CT scan was obtained without contrast which showed a large intraperitoneal bleed. He was noted to be diaphoretic and hypotensive. Resuscitation was begun and the patient was transferred to [facility]. In the emergency department he did respond to blood with a increase in his blood pressure at around 60 systolic to 110. He is complaining of severe abdominal pain. Narcotics are not relieving his symptoms. The patient reports a pulling-type feeling from activity yesterday in his back.  The patient takes Lesia Hausen for atrial flutter year he has been on this since the end of last year. His last dose was yesterday. The patient has a history of mitral valve replacement at [facility]. He is on a beta blocker for hypertension. He is a nonsmoker.  Past Medical History  Diagnosis Date  . Atrial flutter   . Hypertension   . Hypercholesteremia   . Mitral valve disease     annuloplasty 2002 [facility]  . Carotid stenosis     03/10/2007 right & left  bulbs & ICAs 0-49% reduction  ,right subclavian 50% reduction    Past Surgical History  Procedure Laterality Date  . Mitral valve repair  2002    [facility]  . Tee without cardioversion  02/12/2012    Procedure: TRANSESOPHAGEAL ECHOCARDIOGRAM (TEE);  Surgeon: [name], MD;  Location: [facility] ENDOSCOPY;  Service: Cardiovascular;  Laterality: N/A;  . Cardioversion  02/12/2012    Procedure: CARDIOVERSION;  Surgeon: [name], MD;  Location: [facility] ENDOSCOPY;  Service: Cardiovascular;  Laterality: N/A;  . Right heart cath  06/19/2004    normal right heart dynamics. EF 50%  . Nm myoview ltd  07/22/2006    no ischemia    History   Social History  . Marital Status: Married    Spouse Name: N/A    Number of Children: N/A  . Years of Education: N/A   Occupational History  . Not on file.   Social History Main Topics  . Smoking status: Never Smoker   . Smokeless tobacco: Not on file  . Alcohol Use: Yes  . Drug Use: No  . Sexually Active: Not on file   Other Topics Concern  . Not on file   Social History Narrative  . No narrative on file    History reviewed. No pertinent family history.  Allergies as of 08/04/2012 - Review Complete 08/04/2012  Allergen Reaction Noted  . Crestor (rosuvastatin) Other (See Comments) 02/12/2012  . Contrast media (iodinated diagnostic agents)  07/24/2012  . Penicillins Rash 02/12/2012    No current facility-administered medications on file prior to encounter.   Current Outpatient Prescriptions on File Prior to Encounter  Medication Sig Dispense Refill  . alfuzosin (UROXATRAL) 10 MG 24 hr tablet Take 10 mg by mouth daily.      Marland Kitchen aspirin 81 MG tablet Take 81 mg by mouth daily.      . carvedilol (COREG) 6.25 MG tablet Take 6.25 mg  by mouth 2 (two) times daily. Takes 1 1/2 tabs      . Rivaroxaban (XARELTO) 20 MG TABS Take 20 mg by mouth daily.         REVIEW OF SYSTEMS: Cardiovascular: No chest pain, chest pressure, palpitations, orthopnea, or dyspnea on exertion. No claudication or rest pain,  No history of DVT or phlebitis. Pulmonary: No productive cough, asthma or wheezing. Neurologic: No weakness, paresthesias, aphasia, or amaurosis. No dizziness. Hematologic: On anticoagulation for atrial flutter Musculoskeletal: No joint pain or joint swelling. Gastrointestinal: Severe abdominal pain Genitourinary: No dysuria or hematuria. Psychiatric:: No history of major  depression. Integumentary: No rashes or ulcers. Constitutional: No fever or chills.  PHYSICAL EXAMINATION: General: The patient is diaphoretic and very uncomfortable and moderate distress   Vital signs are BP 141/85  Pulse 74  Temp(Src) 96.6 F (35.9 C) (Oral)  Resp 17  Wt 217 lb (98.431 kg)  BMI 28.64 kg/m2  SpO2 100% Pulmonary: Respirations are non-labored HEENT:  No gross abnormalities Abdomen: Soft, tender, right upper quadrant Musculoskeletal: There are no major deformities.   Neurologic: No focal weakness or paresthesias are detected, Skin: There are no ulcer or rashes noted. Psychiatric: The patient has normal affect. Cardiovascular: There is a regular rate and rhythm without significant murmur appreciated.  Diagnostic Studies: I have reviewed his CT scan from med center [facility]. This shows a intraperitoneal bleed in the right upper quadrant. The major vascular structures appear intact. There is no aneurysmal change within the aorta. The superior mesenteric artery appears of normal caliber with a normal outline of the artery. There does appear to be poststenotic dilatation of the celiac axis. This study is without contrast. The epicenter of the blood within the right upper quadrant centered around the duodenum and potentially the gastroduodenal artery. It is difficult to determine this exactly given the lack of IV contrast.    Medication Changes: The patient was placed on our reversal of anticoagulation vertical  Assessment:  Intraperitoneal abdominal bleed Plan: The patient is a hemodynamically unstable from his intraperitoneal bleed of unknown etiology at this time. It has been exacerbated by his use of anticoagulants for his atrial flutter. He will be placed on our reversal protocol. He did respond to transfusion of packed red blood cells. For now aggressive resuscitation will be continued as well as reversal of his anticoagulation with ibuprofen use of products and  reversal agents.  I will repeat his CT angiogram to try to determine the source of bleeding. If a source of bleeding is determined he may be a candidate for selective embolization. He will be given Solu-Medrol Benadryl and Zantac for his contrast dye allergy.  Critical care is at the bedside with the patient discussing the possibility of mechanical ventilation should the patient not be able to maintain his respiratory status given the amount of pain and narcotics he is receiving.  The patient's clinical condition was extensively discussed with the patient's wife at the bedside.     Jorge Ny, M.D. [facility] [place] Office: [phone] Pager:  [phone]

## 2012-08-04 NOTE — ED Notes (Signed)
Blood transfusion #4 [id number] completed at 1600

## 2012-08-04 NOTE — ED Notes (Signed)
RN Ninetta Lights went with this patient via [facility] to administer emergency O negative blood x 2 units. Pt will go to [facility] ED for evaluation by surgery

## 2012-08-04 NOTE — ED Notes (Signed)
MD at bedside. surgery

## 2012-08-04 NOTE — ED Notes (Signed)
Blood transfusion #6 [id number] completed at 1626

## 2012-08-04 NOTE — ED Notes (Signed)
Abdominal cramping with some generalized body aches onset last night this am about 0900 had couple episodes of emesis

## 2012-08-04 NOTE — ED Notes (Signed)
Lab working on Pt. Blood that can poss be ordered for transfusion.  No order placed at this time.

## 2012-08-04 NOTE — Consult Note (Signed)
Sean Stanley [birth year]  [id number].   Primary Cardiology: Dr. Royann Shivers Requesting MD: Dr. Emily Filbert Chief Complaint/Reason for Consult: intraperitoneal hemorrhage  HPI: This is a very pleasant 72 yo white male who has a history of A. Flutter s/p cardioversion who was placed on xarelto in November.  He has had no other issues with this, until this morning he had an acute onset of abdominal pain.  He developed profuse nausea and vomiting, over a dozen times.  He denies hematemesis or hematochezia.  His last BM was yesterday.  No diarrhea.  He went to med center [facility] ED where he had a noncontrasted CT scan that revealed a mesenteric hematoma in the RUQ that measures 10x19cm.  Source if unknown at this time.  The patient is having significant abdominal pain and is hypotensive.  He is not tachycardic, but on a beta blocker.  We have been asked to evaluate this patient along with vascular surgery.  Review of Systems: Please see HPI, otherwise all other systems are negative right now, except for a feeling of light-headedness   History reviewed. No pertinent family history.  Past Medical History  Diagnosis Date  . Atrial flutter   . Hypertension   . Hypercholesteremia   . Mitral valve disease     annuloplasty 2002 [facility]  . Carotid stenosis     03/10/2007 right & left  bulbs & ICAs 0-49% reduction  ,right subclavian 50% reduction    Past Surgical History  Procedure Laterality Date  . Mitral valve repair  2002    [facility]  . Tee without cardioversion  02/12/2012    Procedure: TRANSESOPHAGEAL ECHOCARDIOGRAM (TEE);  Surgeon: [name], MD;  Location: [facility] ENDOSCOPY;  Service: Cardiovascular;  Laterality: N/A;  . Cardioversion  02/12/2012    Procedure: CARDIOVERSION;  Surgeon: [name], MD;  Location: [facility] ENDOSCOPY;  Service: Cardiovascular;  Laterality: N/A;  . Right heart cath  06/19/2004    normal right heart dynamics. EF 50%  . Nm myoview ltd  07/22/2006    no ischemia    Social  History:  reports that he has never smoked. He does not have any smokeless tobacco history on file. He reports that  drinks alcohol. He reports that he does not use illicit drugs.  Allergies:  Allergies  Allergen Reactions  . Crestor (Rosuvastatin) Other (See Comments)    Muscle/chest pain  . Contrast Media (Iodinated Diagnostic Agents)   . Penicillins Rash     (Not in a hospital admission)  Blood pressure 99/70, pulse 72, temperature 98 F (36.7 C), temperature source Oral, resp. rate 21, weight 217 lb (98.431 kg), SpO2 100.00%. Physical Exam: General: pleasant, WD, WN white male who is laying in bed in obvious distress secondary to pain. HEENT: head is normocephalic, atraumatic.  Sclera are noninjected.  PERRL.  Ears and nose without any masses or lesions.  Mouth is pink and moist Heart: regular, rate, and rhythm.  Normal s1,s2. No obvious murmurs, gallops, or rubs noted.  Palpable radial and pedal pulses bilaterally Lungs: CTAB, no wheezes, rhonchi, or rales noted.  Respiratory effort nonlabored Abd: soft, diffusely tender, greatest in RUQ with voluntary guarding, hypoactive BS, some distention, no masses, hernias, or organomegaly MS: all 4 extremities are symmetrical with no cyanosis, clubbing, or edema. Skin: warm, but clammy and minimally diaphoretic with no masses, lesions, or rashes Psych: A&Ox3 with an appropriate affect.    Results for orders placed during the hospital encounter of 08/04/12 (from the past 48  hour(s))  PREPARE RBC (CROSSMATCH)     Status: None   Collection Time    08/04/12  2:22 PM      Result Value Range   Order Confirmation ORDER PROCESSED BY BLOOD BANK    TYPE AND SCREEN     Status: None   Collection Time    08/04/12  2:29 PM      Result Value Range   ABO/RH(D) PENDING     Antibody Screen PENDING     Sample Expiration 08/07/2012     Unit Number [id number]     Blood Component Type RED CELLS,LR     Unit division 00     Status of Unit ISSUED      Unit tag comment VERBAL ORDERS PER DR DELOS     Transfusion Status OK TO TRANSFUSE     Crossmatch Result PENDING     Unit Number [id number]     Blood Component Type RED CELLS,LR     Unit division 00     Status of Unit ISSUED     Unit tag comment VERBAL ORDERS PER DR DELOS     Transfusion Status OK TO TRANSFUSE     Crossmatch Result PENDING    TYPE AND SCREEN     Status: None   Collection Time    08/04/12  3:10 PM      Result Value Range   ABO/RH(D) PENDING     Antibody Screen PENDING     Sample Expiration 08/07/2012     Unit Number [id number]     Blood Component Type RBC LR PHER2     Unit division 00     Status of Unit ISSUED     Unit tag comment VERBAL ORDERS PER DR YAO     Transfusion Status OK TO TRANSFUSE     Crossmatch Result PENDING     Unit Number [id number]     Blood Component Type RBC LR PHER2     Unit division 00     Status of Unit ISSUED     Unit tag comment VERBAL ORDERS PER DR YAO     Transfusion Status OK TO TRANSFUSE     Crossmatch Result PENDING    TYPE AND SCREEN     Status: None   Collection Time    08/04/12  3:20 PM      Result Value Range   ABO/RH(D) PENDING     Antibody Screen PENDING     Sample Expiration 08/07/2012     Unit Number [id number]     Blood Component Type RED CELLS,LR     Unit division 00     Status of Unit ALLOCATED     Unit tag comment VERBAL ORDERS PER DR YAO     Transfusion Status OK TO TRANSFUSE     Crossmatch Result PENDING     Unit Number [id number]     Blood Component Type RED CELLS,LR     Unit division 00     Status of Unit ISSUED     Unit tag comment VERBAL ORDERS PER DR YAO     Transfusion Status OK TO TRANSFUSE     Crossmatch Result PENDING     Unit Number [id number]     Blood Component Type RBC LR PHER1     Unit division 00     Status of Unit ALLOCATED     Unit tag comment VERBAL ORDERS PER DR YAO     Transfusion Status OK TO TRANSFUSE  Crossmatch Result PENDING     Unit Number  [id number]     Blood Component Type RBC LR PHER1     Unit division 00     Status of Unit ISSUED     Unit tag comment VERBAL ORDERS PER DR YAO     Transfusion Status OK TO TRANSFUSE     Crossmatch Result PENDING    POCT I-STAT TROPONIN I     Status: None   Collection Time    08/04/12  3:39 PM      Result Value Range   Troponin i, poc 0.03  0.00 - 0.08 ng/mL   Comment 3            Comment: Due to the release kinetics of cTnI,     a negative result within the first hours     of the onset of symptoms does not rule out     myocardial infarction with certainty.     If myocardial infarction is still suspected,     repeat the test at appropriate intervals.  CG4 I-STAT (LACTIC ACID)     Status: Abnormal   Collection Time    08/04/12  3:41 PM      Result Value Range   Lactic Acid, Venous 4.34 (*) 0.5 - 2.2 mmol/L   Ct Abdomen Pelvis Wo Contrast  08/04/2012  *RADIOLOGY REPORT*  Clinical Data:  Abdominal pain, diaphoretic, hypotensive, evaluate for ruptured AAA  CT CHEST, ABDOMEN AND PELVIS WITHOUT CONTRAST  Technique:  Multidetector CT imaging of the chest, abdomen and pelvis was performed following the standard protocol without IV contrast.  Comparison:   None.  CT CHEST  Findings:  Mild subpleural reticulation/fibrosis in the lungs bilaterally, possibly reflecting mild chronic interstitial lung disease.  No suspicious pulmonary nodules.  No pleural effusion or pneumothorax.  Visualized thyroid is unremarkable.  The heart is mildly enlarged.  Hypodense blood pole relative to myocardium, suggesting anemia.  No pericardial effusion. Atherosclerotic calcifications of the aortic arch.  Small mediastinal lymph nodes which do not meet pathologic CT size criteria.  No suspicious axillary lymphadenopathy.  Degenerative changes of the thoracic spine.  IMPRESSION: No evidence of acute cardiopulmonary disease.  CT ABDOMEN AND PELVIS  Findings:  Liver, spleen, and adrenal glands are within normal limits.   Pancreatic head/uncinate process is displaced anteriorly but otherwise unremarkable.  Gallbladder is unremarkable.  No intrahepatic or extrahepatic ductal dilatation.  Kidneys are unremarkable.  No hydronephrosis.  Large hematoma centered in the right upper abdomen measuring approximately 9.8 x 19.3 cm (series 3/image 81).  This is difficult to separate from the second and third portions of the duodenum as well as the distal celiac artery.  Hemorrhage extends inferiorly just anterior to the right common iliac artery (series 3/image 94) and into the right pelvis (series 3/image 103).  No evidence of abdominal aortic aneurysm.  A fat plane separates the collection from the abdominal aorta and the SMA.  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  IVC is narrowed/slit-like (series 3/image 84), possibly reflecting hypovolemia.  No evidence of bowel obstruction.  Prostatomegaly, measuring 5.8 cm in transverse dimension.  Bladder is within normal limits.  Small volume pelvic ascites.  Small fat-containing left inguinal hernia.  Degenerative changes of the lumbar spine.  IMPRESSION: No evidence of abdominal aortic aneurysm.  Large hematoma centered in the right upper abdomen, as described above, etiology unclear.  Emergent surgical consultation is suggested.  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  Additional ancillary findings as above.  These results were called by telephone on 08/04/2012 at 1415 hours to Dr Judd Lien, who verbally acknowledged these results.   Original Report Authenticated By: Charline Bills, M.D.    Ct Chest Wo Contrast  08/04/2012  *RADIOLOGY REPORT*  Clinical Data:  Abdominal pain, diaphoretic, hypotensive, evaluate for ruptured AAA  CT CHEST, ABDOMEN AND PELVIS WITHOUT CONTRAST  Technique:  Multidetector CT imaging of the chest, abdomen and pelvis was performed following the standard protocol without IV  contrast.  Comparison:   None.  CT CHEST  Findings:  Mild subpleural reticulation/fibrosis in the lungs bilaterally, possibly reflecting mild chronic interstitial lung disease.  No suspicious pulmonary nodules.  No pleural effusion or pneumothorax.  Visualized thyroid is unremarkable.  The heart is mildly enlarged.  Hypodense blood pole relative to myocardium, suggesting anemia.  No pericardial effusion. Atherosclerotic calcifications of the aortic arch.  Small mediastinal lymph nodes which do not meet pathologic CT size criteria.  No suspicious axillary lymphadenopathy.  Degenerative changes of the thoracic spine.  IMPRESSION: No evidence of acute cardiopulmonary disease.  CT ABDOMEN AND PELVIS  Findings:  Liver, spleen, and adrenal glands are within normal limits.  Pancreatic head/uncinate process is displaced anteriorly but otherwise unremarkable.  Gallbladder is unremarkable.  No intrahepatic or extrahepatic ductal dilatation.  Kidneys are unremarkable.  No hydronephrosis.  Large hematoma centered in the right upper abdomen measuring approximately 9.8 x 19.3 cm (series 3/image 81).  This is difficult to separate from the second and third portions of the duodenum as well as the distal celiac artery.  Hemorrhage extends inferiorly just anterior to the right common iliac artery (series 3/image 94) and into the right pelvis (series 3/image 103).  No evidence of abdominal aortic aneurysm.  A fat plane separates the collection from the abdominal aorta and the SMA.  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  IVC is narrowed/slit-like (series 3/image 84), possibly reflecting hypovolemia.  No evidence of bowel obstruction.  Prostatomegaly, measuring 5.8 cm in transverse dimension.  Bladder is within normal limits.  Small volume pelvic ascites.  Small fat-containing left inguinal hernia.  Degenerative changes of the lumbar spine.  IMPRESSION: No evidence of abdominal  aortic aneurysm.  Large hematoma centered in the right upper abdomen, as described above, etiology unclear.  Emergent surgical consultation is suggested.  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  Additional ancillary findings as above.  These results were called by telephone on 08/04/2012 at 1415 hours to Dr Judd Lien, who verbally acknowledged these results.   Original Report Authenticated By: Charline Bills, M.D.        Assessment/Plan 1. intraperitoneal hemorrhage secondary to Xarelto 2. HTN 3. Hyperlipidemia 4. H/o mitral valve replacement 5. H/o A Flutter, s/p cardioversion  Plan: 1. Dr. Janee Morn and Dr. Myra Gianotti have discussed this patient.  It appears that he is bleeding from somewhere in the RUQ mesentery, possible the GDA, but not certain.  The [place] protocol has been activated to try and reverse his Xarelto as much as possible.  Dr. Myra Gianotti has talked to IR, Dr. Grace Isaac as well.  Unsure that angioembolization is going to offer much right now, but may be a possibility.  The initial goal is to try and thicken his blood and stabilize him as much as possible.  We would like to avoid an operation if at all possible, as he  has a high possibility of death with this due to uncontrolled hemorrhage.  We agree with CCM admission for resuscitative efforts.  We will follow closely with you.  Patient needs to have blood products readily available for transfusion as needed.    Mariadejesus Cade E 08/04/2012, 3:53 PM Pager: [phone]

## 2012-08-04 NOTE — Significant Event (Signed)
Last two units of FFP from ED given emergently at bedside. Verified products with second RN. Pt educated on s/s to report. VS obtained. No reactions during transfusions. Will continue to monitor. Rinda Rollyson, Charity fundraiser.

## 2012-08-04 NOTE — Progress Notes (Signed)
I have reviewed the CTA.  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  By CTA there is no active bleeding.  The patient appears to have stabilized and is resting more comfortably.  I have discussed the CTA findings with the wife and daughter.  I have recommended angiography and embolization to prevent any further bleeding.  I would recommend doing this tomorrow, so as to give more time for the Xaralto to get out of his system.  If he develops an acute decompensation, this would have to be done tonight.  I will order a amylase and lipase to rule out pancreatitis as a possible cause.  Durene Cal

## 2012-08-04 NOTE — ED Notes (Signed)
MD at bedside; vascular

## 2012-08-04 NOTE — ED Provider Notes (Signed)
History     CSN: [id number]  Arrival date & time 08/04/12  1344   First MD Initiated Contact with Patient 08/04/12 1345      Chief Complaint  Patient presents with  . Nausea  . Emesis    (Consider location/radiation/quality/duration/timing/severity/associated sxs/prior treatment) HPI Comments: The patient presents here with complaints of nausea and vomiting that started this morning.  Shortly thereafter, he developed severe pain in the middle of his abdomen.  No fevers or chills.  No diarrhea.  He has a history of atrial flutter and is on xarelto.    Patient is a 72 y.o. male presenting with vomiting. The history is provided by the patient and the spouse.  Emesis Severity:  Moderate Timing:  Constant Quality:  Stomach contents How soon after eating does vomiting occur:  6 hours Progression:  Worsening Chronicity:  New Recent urination:  Normal Worsened by:  Nothing tried Ineffective treatments:  None tried Associated symptoms: abdominal pain     Past Medical History  Diagnosis Date  . Atrial flutter   . Hypertension   . Hypercholesteremia   . Atrial fibrillation   . Mitral valve disease     annuloplasty 2002 [facility]  . Carotid stenosis     03/10/2007 right & left  bulbs & ICAs 0-49% reduction  ,right subclavian 50% reduction    Past Surgical History  Procedure Laterality Date  . Mitral valve repair  2002    [facility]  . Tee without cardioversion  02/12/2012    Procedure: TRANSESOPHAGEAL ECHOCARDIOGRAM (TEE);  Surgeon: [name], MD;  Location: [facility] ENDOSCOPY;  Service: Cardiovascular;  Laterality: N/A;  . Cardioversion  02/12/2012    Procedure: CARDIOVERSION;  Surgeon: [name], MD;  Location: [facility] ENDOSCOPY;  Service: Cardiovascular;  Laterality: N/A;  . Right heart cath  06/19/2004    normal right heart dynamics. EF 50%  . Nm myoview ltd  07/22/2006    no ischemia    History reviewed. No pertinent family history.  History  Substance Use Topics  .  Smoking status: Never Smoker   . Smokeless tobacco: Not on file  . Alcohol Use: Yes      Review of Systems  Gastrointestinal: Positive for vomiting and abdominal pain.  All other systems reviewed and are negative.    Allergies  Crestor; Contrast media; and Penicillins  Home Medications   Current Outpatient Rx  Name  Route  Sig  Dispense  Refill  . alfuzosin (UROXATRAL) 10 MG 24 hr tablet   Oral   Take 10 mg by mouth daily.         Marland Kitchen aspirin 81 MG tablet   Oral   Take 81 mg by mouth daily.         . carvedilol (COREG) 6.25 MG tablet   Oral   Take 6.25 mg by mouth 2 (two) times daily. Takes 1 1/2 tabs         . Rivaroxaban (XARELTO) 20 MG TABS   Oral   Take 20 mg by mouth daily.           BP 92/78  Pulse 51  Temp(Src) 98 F (36.7 C) (Oral)  Resp 18  SpO2 99%  Physical Exam  Nursing note and vitals reviewed. Constitutional: He is oriented to person, place, and time.  The patient appears pale and is diaphoretic.    HENT:  Head: Normocephalic and atraumatic.  MM's dry.  Neck: Normal range of motion. Neck supple.  Cardiovascular: Normal rate  and regular rhythm.   No murmur heard. Pulmonary/Chest: Effort normal and breath sounds normal. No respiratory distress. He has no wheezes.  Abdominal:  The abdomen is firm with ttp in the periumbilical region and right mid abdomen.  There is no rebound or guarding.  Musculoskeletal: Normal range of motion. He exhibits no edema.  Neurological: He is alert and oriented to person, place, and time.  Skin: There is pallor.  Cool, diaphoretic.    ED Course  Procedures (including critical care time)  Labs Reviewed  COMPREHENSIVE METABOLIC PANEL  CBC WITH DIFFERENTIAL  PREPARE RBC (CROSSMATCH)  TYPE AND SCREEN  TYPE AND SCREEN  TYPE AND SCREEN   Ct Abdomen Pelvis Wo Contrast  08/04/2012  *RADIOLOGY REPORT*  Clinical Data:  Abdominal pain, diaphoretic, hypotensive, evaluate for ruptured AAA  CT CHEST, ABDOMEN  AND PELVIS WITHOUT CONTRAST  Technique:  Multidetector CT imaging of the chest, abdomen and pelvis was performed following the standard protocol without IV contrast.  Comparison:   None.  CT CHEST  Findings:  Mild subpleural reticulation/fibrosis in the lungs bilaterally, possibly reflecting mild chronic interstitial lung disease.  No suspicious pulmonary nodules.  No pleural effusion or pneumothorax.  Visualized thyroid is unremarkable.  The heart is mildly enlarged.  Hypodense blood pole relative to myocardium, suggesting anemia.  No pericardial effusion. Atherosclerotic calcifications of the aortic arch.  Small mediastinal lymph nodes which do not meet pathologic CT size criteria.  No suspicious axillary lymphadenopathy.  Degenerative changes of the thoracic spine.  IMPRESSION: No evidence of acute cardiopulmonary disease.  CT ABDOMEN AND PELVIS  Findings:  Liver, spleen, and adrenal glands are within normal limits.  Pancreatic head/uncinate process is displaced anteriorly but otherwise unremarkable.  Gallbladder is unremarkable.  No intrahepatic or extrahepatic ductal dilatation.  Kidneys are unremarkable.  No hydronephrosis.  Large hematoma centered in the right upper abdomen measuring approximately 9.8 x 19.3 cm (series 3/image 81).  This is difficult to separate from the second and third portions of the duodenum as well as the distal celiac artery.  Hemorrhage extends inferiorly just anterior to the right common iliac artery (series 3/image 94) and into the right pelvis (series 3/image 103).  No evidence of abdominal aortic aneurysm.  A fat plane separates the collection from the abdominal aorta and the SMA.  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  IVC is narrowed/slit-like (series 3/image 84), possibly reflecting hypovolemia.  No evidence of bowel obstruction.  Prostatomegaly, measuring 5.8 cm in transverse dimension.  Bladder is within normal limits.   Small volume pelvic ascites.  Small fat-containing left inguinal hernia.  Degenerative changes of the lumbar spine.  IMPRESSION: No evidence of abdominal aortic aneurysm.  Large hematoma centered in the right upper abdomen, as described above, etiology unclear.  Emergent surgical consultation is suggested.  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  Additional ancillary findings as above.  These results were called by telephone on 08/04/2012 at 1415 hours to Dr Judd Lien, who verbally acknowledged these results.   Original Report Authenticated By: Charline Bills, M.D.    Ct Chest Wo Contrast  08/04/2012  *RADIOLOGY REPORT*  Clinical Data:  Abdominal pain, diaphoretic, hypotensive, evaluate for ruptured AAA  CT CHEST, ABDOMEN AND PELVIS WITHOUT CONTRAST  Technique:  Multidetector CT imaging of the chest, abdomen and pelvis was performed following the standard protocol without IV contrast.  Comparison:   None.  CT CHEST  Findings:  Mild  subpleural reticulation/fibrosis in the lungs bilaterally, possibly reflecting mild chronic interstitial lung disease.  No suspicious pulmonary nodules.  No pleural effusion or pneumothorax.  Visualized thyroid is unremarkable.  The heart is mildly enlarged.  Hypodense blood pole relative to myocardium, suggesting anemia.  No pericardial effusion. Atherosclerotic calcifications of the aortic arch.  Small mediastinal lymph nodes which do not meet pathologic CT size criteria.  No suspicious axillary lymphadenopathy.  Degenerative changes of the thoracic spine.  IMPRESSION: No evidence of acute cardiopulmonary disease.  CT ABDOMEN AND PELVIS  Findings:  Liver, spleen, and adrenal glands are within normal limits.  Pancreatic head/uncinate process is displaced anteriorly but otherwise unremarkable.  Gallbladder is unremarkable.  No intrahepatic or extrahepatic ductal dilatation.  Kidneys are unremarkable.  No hydronephrosis.  Large hematoma centered in the  right upper abdomen measuring approximately 9.8 x 19.3 cm (series 3/image 81).  This is difficult to separate from the second and third portions of the duodenum as well as the distal celiac artery.  Hemorrhage extends inferiorly just anterior to the right common iliac artery (series 3/image 94) and into the right pelvis (series 3/image 103).  No evidence of abdominal aortic aneurysm.  A fat plane separates the collection from the abdominal aorta and the SMA.  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  IVC is narrowed/slit-like (series 3/image 84), possibly reflecting hypovolemia.  No evidence of bowel obstruction.  Prostatomegaly, measuring 5.8 cm in transverse dimension.  Bladder is within normal limits.  Small volume pelvic ascites.  Small fat-containing left inguinal hernia.  Degenerative changes of the lumbar spine.  IMPRESSION: No evidence of abdominal aortic aneurysm.  Large hematoma centered in the right upper abdomen, as described above, etiology unclear.  Emergent surgical consultation is suggested.  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  Additional ancillary findings as above.  These results were called by telephone on 08/04/2012 at 1415 hours to Dr Judd Lien, who verbally acknowledged these results.   Original Report Authenticated By: Charline Bills, M.D.      1. Intra abdominal hemorrhage      Date: 08/04/2012  Rate: 58  Rhythm: normal sinus rhythm  QRS Axis: normal  Intervals: normal  ST/T Wave abnormalities: normal  Conduction Disutrbances:none  Narrative Interpretation:   Old EKG Reviewed: none available    MDM  The patient presents with nausea and vomiting, then followed by abdominal pain which started this morning.  When he initially arrived the blood pressure was 147/74.  While I was evaluating him, he became extremely diaphoretic, light-headed, and hypotensive with blood pressure dropping  to 60's/40's.  A second IV line was established and aggressive hydration was initiated.  As he was complaining of severe abd pain, my suspicion was for AAA.  I notified radiology the patient was on his way for a ct scan and the patient was immediately taken there.  CT scan of the chest, abd, and pelvis was obtained which revealed a large, acute intra-abdominal hemorrhage without evidence for aneurysm.    I spoke with Dr. Myra Gianotti from Vascular Surgery who viewed the scans and wanted the patient sent immediately to [facility].  I also spoke with Dr.'s Silverio Lay in the [facility] ED and Janee Morn from General Surgery to make them aware of the situation.    [facility] was called and the patient was sent 911 to the [facility] ED for emergent surgical consultation.  The patient was started on 2 units of  O- blood prior to transport.  The blood pressure when the left the department was 90/60 and he appeared somewhat less pale but remained diaphoretic.  I am unsure as to where the bleeding originated.  It does not appear to be AAA, however there may well be an aneurysm of a smaller artery, possibly the splenic or renal that was exacerbated by the patient taking xarelto.   CRITICAL CARE Performed by: Geoffery Lyons Total critical care time: 30 minutes Critical care time was exclusive of separately billable procedures and treating other patients. Critical care was necessary to treat or prevent imminent or life-threatening deterioration. Critical care was time spent personally by me on the following activities: development of treatment plan with patient and/or surrogate as well as nursing, discussions with consultants, evaluation of patient's response to treatment, examination of patient, obtaining history from patient or surrogate, ordering and performing treatments and interventions, ordering and review of laboratory studies, ordering and review of radiographic studies, pulse oximetry and re-evaluation of patient's  condition.         Geoffery Lyons, MD 08/04/12 1537

## 2012-08-04 NOTE — ED Notes (Signed)
MD at bedside.- critical care MD

## 2012-08-04 NOTE — ED Notes (Signed)
Pt last ate this morning at 0900 this morning. Pt states weight is 217 lbs

## 2012-08-04 NOTE — ED Notes (Signed)
Blood transfusion #5 [id number] completed at 1615

## 2012-08-04 NOTE — ED Notes (Signed)
911 has been called to transport patient to [facility].  The Doctor with Vascular and general surgery has been called.

## 2012-08-04 NOTE — H&P (Signed)
PULMONARY  / CRITICAL CARE MEDICINE  Name: Sean Stanley MRN: [id number] DOB: Jul 06, 1940    ADMISSION DATE:  08/04/2012 CONSULTATION DATE:  08/04/12  REFERRING MD :  EDP PRIMARY SERVICE: PCCM  CHIEF COMPLAINT:  Abdominal Pain  BRIEF PATIENT DESCRIPTION: 72 year old male with PMH of atrial flutter on xeralto who [facility] after a day of abdominal pain, N/V and fever.  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  Patient was transferred to [facility] for vascular surgery to evaluate.  In the ED the patient was noted to remain bradycardic and BP was very fluctuant.  Patient was in severe pain upon evaluation and history was obtained from family.  SIGNIFICANT EVENTS / STUDIES:  5/13>>>Intrabdominal bleeding.  LINES / TUBES: R IJ Cortis 5/13>>>  CULTURES: None  ANTIBIOTICS: None  PAST MEDICAL HISTORY :  Past Medical History  Diagnosis Date  . Atrial flutter   . Hypertension   . Hypercholesteremia   . Mitral valve disease     annuloplasty 2002 [facility]  . Carotid stenosis     03/10/2007 right & left  bulbs & ICAs 0-49% reduction  ,right subclavian 50% reduction   Past Surgical History  Procedure Laterality Date  . Mitral valve repair  2002    [facility]  . Tee without cardioversion  02/12/2012    Procedure: TRANSESOPHAGEAL ECHOCARDIOGRAM (TEE);  Surgeon: [name], MD;  Location: [facility] ENDOSCOPY;  Service: Cardiovascular;  Laterality: N/A;  . Cardioversion  02/12/2012    Procedure: CARDIOVERSION;  Surgeon: [name], MD;  Location: [facility] ENDOSCOPY;  Service: Cardiovascular;  Laterality: N/A;  . Right heart cath  06/19/2004    normal right heart dynamics. EF 50%  . Nm myoview ltd  07/22/2006    no ischemia   Prior to Admission medications   Medication Sig Start Date End Date Taking? Authorizing Provider  alfuzosin (UROXATRAL) 10 MG 24 hr tablet Take 10 mg by mouth daily.    Historical Provider, MD  aspirin 81 MG tablet Take 81 mg by mouth  daily.    Historical Provider, MD  carvedilol (COREG) 6.25 MG tablet Take 6.25 mg by mouth 2 (two) times daily. Takes 1 1/2 tabs    Historical Provider, MD  Rivaroxaban (XARELTO) 20 MG TABS Take 20 mg by mouth daily.    Historical Provider, MD   Allergies  Allergen Reactions  . Crestor (Rosuvastatin) Other (See Comments)    Muscle/chest pain  . Contrast Media (Iodinated Diagnostic Agents)   . Penicillins Rash   FAMILY HISTORY:  History reviewed. No pertinent family history.  SOCIAL HISTORY:  reports that he has never smoked. He does not have any smokeless tobacco history on file. He reports that  drinks alcohol. He reports that he does not use illicit drugs.  REVIEW OF SYSTEMS:  Severe abdominal pain, N/V and fever overnight, otherwise 12 point review of system negative.  SUBJECTIVE: Abdominal pain, N/V and fever.  VITAL SIGNS: Temp:  [98 F (36.7 C)-98.1 F (36.7 C)] 98 F (36.7 C) (05/13 1359) Pulse Rate:  [51-87] 72 (05/13 1524) Resp:  [18-22] 21 (05/13 1524) BP: (92-147)/(66-78) 99/70 mmHg (05/13 1524) SpO2:  [99 %-100 %] 100 % (05/13 1524) Weight:  [98.431 kg (217 lb)] 98.431 kg (217 lb) (05/13 1502) HEMODYNAMICS:   VENTILATOR SETTINGS:   INTAKE / OUTPUT: Intake/Output   None     PHYSICAL EXAMINATION: General:  Well appearing male in severe painful distress. Neuro:  Alert and interactive, moving all ext to command. HEENT:  [place]/AT, PERRL, EOM-I and -thyromegally/LAN. Cardiovascular:  RRR, Nl S1/S2, -M/R/G. Lungs:  CTA bilaterally. Abdomen:  Soft, NT, ND and +BS. Musculoskeletal:  -edema and -tenderness. Skin:  Cold and clammy.  LABS:  Recent Labs Lab 08/04/12 1440 08/04/12 1519 08/04/12 1541  HGB 12.4* 10.6*  --   WBC 9.1 15.6*  --   PLT 206 173  --   NA 137  --   --   K 3.6  --   --   CL 103  --   --   CO2 21  --   --   GLUCOSE 151*  --   --   BUN 22  --   --   CREATININE 1.10  --   --   CALCIUM 8.8  --   --   AST 17  --   --   ALT 12  --   --    ALKPHOS 58  --   --   BILITOT 1.8*  --   --   PROT 6.2  --   --   ALBUMIN 3.5  --   --   LATICACIDVEN  --   --  4.34*   No results found for this basename: GLUCAP,  in the last 168 hours  CXR: Pending.  ASSESSMENT / PLAN:  PULMONARY A: RR decreasing with pain medications, concern is going to be balancing pain medications and respiratory drive. P:   - Titrate O2 as needed. - IS and flutter valve.  CARDIOVASCULAR A: HTN and a-fib history s/p cardioversion.  On carvidalol and unable to increase rate.  Hemorrhagic shock from intraabdominal bleed. P:  - Reverse carvidalol with glucagon. - Volume resuscitation. - Follow CVP. - See H/O section. - Surgery to address hemorrhage.  RENAL A:  No active issues. P:   - Monitor I/O closely. - Monitor renal function and intraabdominal pressure.  GASTROINTESTINAL A:  Intraabdominal hemorrhage.  N/V. P:   - CTA of the abdomen ordered by vascular surgery. - Pain control.  HEMATOLOGIC A:  Hemorrhagic shock, coagulopathic and massive transfusion. P:  - Place cortis. - Monitor CVP. - Transfuse 4 units FFP and 2 units of platelet. - CT of the abdomen with contrast.  INFECTIOUS A:  No active issues. P:   - Monitor fever curve and WBC.  ENDOCRINE A:  No history of diabetes.   P:   - Check cortisol level. - Check CBGs with glucagon. - No need for stress dose steroids for now.  NEUROLOGIC A:  No active issues. P:   - Monitor neuro status with narcotics.  I have personally obtained a history, examined the patient, evaluated laboratory and imaging results, formulated the assessment and plan and placed orders.  CRITICAL CARE: The patient is critically ill with multiple organ systems failure and requires high complexity decision making for assessment and support, frequent evaluation and titration of therapies, application of advanced monitoring technologies and extensive interpretation of multiple databases. Critical Care Time  devoted to patient care services described in this note is 45 minutes.   Alyson Reedy, M.D. Pulmonary and Critical Care Medicine [facility] Pager: [phone]  08/04/2012, 4:16 PM

## 2012-08-05 ENCOUNTER — Telehealth: Payer: Self-pay | Admitting: Cardiovascular Disease

## 2012-08-05 ENCOUNTER — Inpatient Hospital Stay (HOSPITAL_COMMUNITY): Payer: Medicare Other

## 2012-08-05 DIAGNOSIS — R58 Hemorrhage, not elsewhere classified: Secondary | ICD-10-CM | POA: Diagnosis not present

## 2012-08-05 DIAGNOSIS — R578 Other shock: Secondary | ICD-10-CM | POA: Diagnosis not present

## 2012-08-05 DIAGNOSIS — I728 Aneurysm of other specified arteries: Secondary | ICD-10-CM | POA: Diagnosis not present

## 2012-08-05 DIAGNOSIS — D689 Coagulation defect, unspecified: Secondary | ICD-10-CM | POA: Diagnosis not present

## 2012-08-05 DIAGNOSIS — R109 Unspecified abdominal pain: Secondary | ICD-10-CM | POA: Diagnosis not present

## 2012-08-05 DIAGNOSIS — T794XXA Traumatic shock, initial encounter: Secondary | ICD-10-CM | POA: Diagnosis not present

## 2012-08-05 DIAGNOSIS — D62 Acute posthemorrhagic anemia: Secondary | ICD-10-CM | POA: Diagnosis not present

## 2012-08-05 HISTORY — PX: ABDOMINAL ANGIOGRAM: SHX5705

## 2012-08-05 LAB — CBC
HCT: 21.8 % — ABNORMAL LOW (ref 39.0–52.0)
Hemoglobin: 8.5 g/dL — ABNORMAL LOW (ref 13.0–17.0)
MCH: 29.9 pg (ref 26.0–34.0)
MCH: 29.4 pg (ref 26.0–34.0)
MCHC: 35.7 g/dL (ref 30.0–36.0)
Platelets: 132 10*3/uL — ABNORMAL LOW (ref 150–400)
Platelets: 132 10*3/uL — ABNORMAL LOW (ref 150–400)
Platelets: 143 10*3/uL — ABNORMAL LOW (ref 150–400)
Platelets: 142 10*3/uL — ABNORMAL LOW (ref 150–400)
RBC: 2.54 MIL/uL — ABNORMAL LOW (ref 4.22–5.81)
RBC: 2.59 MIL/uL — ABNORMAL LOW (ref 4.22–5.81)
RBC: 2.62 MIL/uL — ABNORMAL LOW (ref 4.22–5.81)
RBC: 2.92 MIL/uL — ABNORMAL LOW (ref 4.22–5.81)
RDW: 14.4 % (ref 11.5–15.5)
RDW: 14.4 % (ref 11.5–15.5)
WBC: 10.9 10*3/uL — ABNORMAL HIGH (ref 4.0–10.5)
WBC: 13.1 10*3/uL — ABNORMAL HIGH (ref 4.0–10.5)
WBC: 14.9 10*3/uL — ABNORMAL HIGH (ref 4.0–10.5)
WBC: 16.5 10*3/uL — ABNORMAL HIGH (ref 4.0–10.5)

## 2012-08-05 LAB — PHOSPHORUS: Phosphorus: 3.4 mg/dL (ref 2.3–4.6)

## 2012-08-05 LAB — BASIC METABOLIC PANEL
CO2: 23 mEq/L (ref 19–32)
CO2: 25 mEq/L (ref 19–32)
Calcium: 8 mg/dL — ABNORMAL LOW (ref 8.4–10.5)
GFR calc non Af Amer: 54 mL/min — ABNORMAL LOW (ref >90)
Glucose, Bld: 148 mg/dL — ABNORMAL HIGH (ref 70–99)
Potassium: 4.2 mEq/L (ref 3.5–5.1)
Sodium: 138 mEq/L (ref 135–145)
Sodium: 140 mEq/L (ref 135–145)

## 2012-08-05 LAB — TYPE AND SCREEN: ABO/RH(D): A POS

## 2012-08-05 LAB — PREPARE PLATELET PHERESIS

## 2012-08-05 LAB — PREPARE FRESH FROZEN PLASMA
Unit division: 0
Unit division: 0
Unit division: 0

## 2012-08-05 LAB — PROTIME-INR
INR: 1.33 (ref 0.00–1.49)
INR: 1.4 (ref 0.00–1.49)
Prothrombin Time: 16.2 seconds — ABNORMAL HIGH (ref 11.6–15.2)

## 2012-08-05 LAB — MAGNESIUM: Magnesium: 1.8 mg/dL (ref 1.5–2.5)

## 2012-08-05 LAB — AMYLASE: Amylase: 47 U/L (ref 0–105)

## 2012-08-05 LAB — LIPASE, BLOOD: Lipase: 29 U/L (ref 11–59)

## 2012-08-05 MED ORDER — NITROGLYCERIN 1 MG/10 ML FOR IR/CATH LAB
INTRA_ARTERIAL | Status: AC
  Filled 2012-08-05: qty 10

## 2012-08-05 MED ORDER — METHYLPREDNISOLONE SODIUM SUCC 125 MG IJ SOLR
125.0000 mg | INTRAMUSCULAR | Status: AC
  Administered 2012-08-05: 125 mg via INTRAVENOUS
  Filled 2012-08-05: qty 2

## 2012-08-05 MED ORDER — BACITRACIN-POLYMYXIN B 500-10000 UNIT/GM OP OINT
TOPICAL_OINTMENT | Freq: Two times a day (BID) | OPHTHALMIC | Status: DC
  Administered 2012-08-05 – 2012-08-11 (×12): via OPHTHALMIC
  Filled 2012-08-05: qty 3.5

## 2012-08-05 MED ORDER — DIPHENHYDRAMINE HCL 50 MG/ML IJ SOLN
25.0000 mg | INTRAMUSCULAR | Status: AC
  Administered 2012-08-05: 25 mg via INTRAVENOUS
  Filled 2012-08-05: qty 1

## 2012-08-05 MED ORDER — LATANOPROST 0.005 % OP SOLN
1.0000 [drp] | Freq: Every day | OPHTHALMIC | Status: DC
  Administered 2012-08-05 – 2012-08-10 (×6): 1 [drp] via OPHTHALMIC
  Filled 2012-08-05: qty 2.5

## 2012-08-05 MED ORDER — FAMOTIDINE IN NACL 20-0.9 MG/50ML-% IV SOLN
20.0000 mg | INTRAVENOUS | Status: AC
  Administered 2012-08-05: 20 mg via INTRAVENOUS

## 2012-08-05 MED ORDER — MIDAZOLAM HCL 2 MG/2ML IJ SOLN
INTRAMUSCULAR | Status: AC | PRN
  Administered 2012-08-05 (×2): 0.5 mg via INTRAVENOUS
  Administered 2012-08-05: 1 mg via INTRAVENOUS
  Administered 2012-08-05 (×2): 0.5 mg via INTRAVENOUS

## 2012-08-05 MED ORDER — SODIUM CHLORIDE 0.9 % IV SOLN
INTRAVENOUS | Status: DC
  Administered 2012-08-04 – 2012-08-07 (×4): via INTRAVENOUS

## 2012-08-05 MED ORDER — IOHEXOL 300 MG/ML  SOLN
100.0000 mL | Freq: Once | INTRAMUSCULAR | Status: AC | PRN
  Administered 2012-08-05: 80 mL via INTRAVENOUS

## 2012-08-05 MED ORDER — PROMETHAZINE HCL 25 MG/ML IJ SOLN
12.5000 mg | INTRAMUSCULAR | Status: DC | PRN
  Administered 2012-08-05: 12.5 mg via INTRAVENOUS
  Filled 2012-08-05: qty 1

## 2012-08-05 MED ORDER — FENTANYL CITRATE 0.05 MG/ML IJ SOLN
INTRAMUSCULAR | Status: AC | PRN
  Administered 2012-08-05 (×2): 25 ug via INTRAVENOUS
  Administered 2012-08-05: 50 ug via INTRAVENOUS

## 2012-08-05 MED ORDER — NEOMYCIN-BACITRACIN ZN-POLYMYX 5-400-10000 OP OINT
1.0000 | TOPICAL_OINTMENT | Freq: Two times a day (BID) | OPHTHALMIC | Status: DC
Start: 2012-08-05 — End: 2012-08-05
  Filled 2012-08-05: qty 3.5

## 2012-08-05 NOTE — ED Notes (Signed)
Blood infused, #[id number] at 1505, . Unit #[id number] infused at 1525, . VS stable. Patient aware not to bend right leg or lift head off pillow.

## 2012-08-05 NOTE — Progress Notes (Signed)
Both units of PRBC started at the same time, MD request to get blood in before procedure. IR called at the time blood was ordered, Blood started in IR prior to start of procedure and report given to IR nurse whom was taking over care.

## 2012-08-05 NOTE — Progress Notes (Signed)
[facility]  Subjective  -   Patient is feeling better this morning. He still has right-sided abdominal pain. No significant nausea. No chest pain. She had an uneventful evening.   Physical Exam:  Cardiovascular: Regular rhythm Pulmonary: Respirations are nonlabored Abdomen: Soft with right-sided tenderness. Extremities: Warm well perfused.       Assessment/Plan:  Retroperitoneal bleed secondary to anticoagulation  I have discussed the case with interventional radiology this morning. I recommended proceeding with angiography and possible embolization of the pseudoaneurysm which is the likely source for his bleed. Fortunately, the patient hemoglobin has remained stable over the past 12 hours. He may require transfusion as his hematocrit is 7. It has been 7 for the past 3 blood draws. He has had a slight increase in his creatinine suggesting possibly ATN secondary to hypotension and contrast administration. I feel with hydration he should be stable for angiography.   BRABHAM IV, V. WELLS 08/05/2012 10:37 AM --  Filed Vitals:   08/05/12 1030  BP: 95/60  Pulse: 96  Temp:   Resp: 18    Intake/Output Summary (Last 24 hours) at 08/05/12 1037 Last data filed at 08/05/12 1000  Gross per 24 hour  Intake 1922.67 ml  Output   1190 ml  Net 732.67 ml     Laboratory CBC    Component Value Date/Time   WBC 13.1* 08/05/2012 1000   HGB 7.7* 08/05/2012 1000   HCT 22.0* 08/05/2012 1000   PLT 143* 08/05/2012 1000    BMET    Component Value Date/Time   NA 138 08/05/2012 0004   K 4.2 08/05/2012 0004   CL 106 08/05/2012 0004   CO2 23 08/05/2012 0004   GLUCOSE 148* 08/05/2012 0004   BUN 28* 08/05/2012 0004   CREATININE 1.29 08/05/2012 0004   CALCIUM 7.4* 08/05/2012 0004   GFRNONAA 54* 08/05/2012 0004   GFRAA 63* 08/05/2012 0004    COAG Lab Results  Component Value Date   INR 1.33 08/05/2012   INR 1.09 08/04/2012   INR 1.78* 08/04/2012   No results  found for this basename: PTT    Antibiotics Anti-infectives   None       V. Charlena Cross, M.D. [facility] [place] Office: [phone] Pager:  [phone]

## 2012-08-05 NOTE — Progress Notes (Addendum)
PULMONARY  / CRITICAL CARE MEDICINE  Name: Sean Stanley MRN: [id number] DOB: May 20, 1940    ADMISSION DATE:  08/04/2012 CONSULTATION DATE:  08/04/12  REFERRING MD :  EDP PRIMARY SERVICE: PCCM  CHIEF COMPLAINT:  Abdominal Pain  BRIEF PATIENT DESCRIPTION: 72 year old male with PMH of atrial flutter on xeralto who presented to [facility] after a day of abdominal pain, N/V and fever.  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  Patient was transferred to [facility] for vascular surgery to evaluate.  In the ED the patient was noted to remain bradycardic and BP was very fluctuant.  Patient was in severe pain upon evaluation and history was obtained from family.  SIGNIFICANT EVENTS / STUDIES:  5/13>>>Intrabdominal bleeding. 5/13 CT-angio abd >> large hematoma, pseudoaneurysm of pancreaticoduodenal branch without active bleeding  LINES / TUBES: R IJ Cordis 5/13>>>  CULTURES: None  ANTIBIOTICS: None  SUBJECTIVE:  Still w abd pain, episode diaphoresis this am.   VITAL SIGNS: Temp:  [96.3 F (35.7 C)-98.4 F (36.9 C)] 98.4 F (36.9 C) (05/14 0753) Pulse Rate:  [51-111] 89 (05/14 1000) Resp:  [13-28] 19 (05/14 1000) BP: (91-161)/(48-103) 161/96 mmHg (05/14 1000) SpO2:  [93 %-100 %] 96 % (05/14 1000) Weight:  [98.431 kg (217 lb)-110 kg (242 lb 8.1 oz)] 110 kg (242 lb 8.1 oz) (05/14 0600) HEMODYNAMICS: CVP:  [2 mmHg-11 mmHg] 11 mmHg VENTILATOR SETTINGS:   INTAKE / OUTPUT: Intake/Output     05/13 0701 - 05/14 0700 05/14 0701 - 05/15 0700   I.V. (mL/kg) 240 (2.2) 60 (0.5)   Blood 1551.7    IV Piggyback 71    Total Intake(mL/kg) 1862.7 (16.9) 60 (0.5)   Urine (mL/kg/hr) 890 300 (0.8)   Total Output 890 300   Net +972.7 -240          PHYSICAL EXAMINATION: General:  Well appearing male, no distress Neuro:  Alert and interactive, moving all ext to command. HEENT:  [place]/AT, PERRL, EOM-I and -thyromegally/LAN. Cardiovascular:  RRR, Nl S1/S2, -M/R/G. Lungs:   CTA bilaterally. Abdomen:  Soft, NT, ND and +BS. Musculoskeletal:  -edema and -tenderness. Skin:  Cold and clammy.  LABS:  Recent Labs Lab 08/04/12 1440 08/04/12 1519 08/04/12 1541 08/04/12 1701 08/04/12 1702 08/04/12 1720 08/04/12 1934 08/05/12 0004 08/05/12 0007 08/05/12 0400  HGB 12.4* 10.6*  --  11.8*  --   --  7.8* 7.6*  --  7.7*  WBC 9.1 15.6*  --  14.6*  --   --  11.8* 10.5  --  10.9*  PLT 206 173  --  144*  --   --  138* 132*  --  132*  NA 137 137  --  137  --   --   --  138  --   --   K 3.6 3.6  --  4.2  --   --   --  4.2  --   --   CL 103 106  --  104  --   --   --  106  --   --   CO2 21 18*  --  20  --   --   --  23  --   --   GLUCOSE 151* 192*  --  248*  --   --   --  148*  --   --   BUN 22 22  --  22  --   --   --  28*  --   --  CREATININE 1.10 1.04  --  0.98  --   --   --  1.29  --   --   CALCIUM 8.8 7.6*  --  7.2*  --   --   --  7.4*  --   --   MG  --   --   --  1.5  --   --   --   --   --   --   PHOS  --   --   --  4.1  --   --   --   --   --   --   AST 17 13  --  12  --   --   --   --   --   --   ALT 12 9  --  9  --   --   --   --   --   --   ALKPHOS 58 44  --  41  --   --   --   --   --   --   BILITOT 1.8* 1.6*  --  3.0*  --   --   --   --   --   --   PROT 6.2 4.6*  --  4.1*  --   --   --   --   --   --   ALBUMIN 3.5 2.4*  --  2.3*  --   --   --   --   --   --   APTT  --  35  --  30  --   --   --  33  --   --   INR  --  1.78*  --  1.09  --   --   --  1.33  --   --   LATICACIDVEN  --   --  4.34*  --  7.1*  --   --   --  2.5*  --   PROBNP  --   --   --   --  235.1*  --   --   --   --   --   PHART  --   --   --   --   --  7.295*  --   --   --   --   PCO2ART  --   --   --   --   --  30.8*  --   --   --   --   PO2ART  --   --   --   --   --  78.0*  --   --   --   --    No results found for this basename: GLUCAP,  in the last 168 hours  CXR:   ASSESSMENT / PLAN:  PULMONARY A: Resp distress better with pain medications, concern is going to be balancing  pain medications and respiratory drive. P:   - Titrate O2 as needed. - IS and flutter valve.  CARDIOVASCULAR A: HTN and a-fib history s/p cardioversion.  Hemorrhagic shock from intraabdominal bleed. P:  - coreg reversed, glucagon off 5/13 - Volume resuscitation. - Follow CVP. - See H/O section. - Dr Myra Gianotti following, plan is for angiography and embolization (pre-meds for dye allergy); need to check renal fxn before dye load  RENAL A:  Acute renal insufficiency  Lactic acidosis, resolved P:   - Monitor I/O closely. - Monitor renal function and intraabdominal pressure. - BMP pending  this am >> need to insure stability and appropriateness for contrast dye load at this time.   GASTROINTESTINAL A:  Intraabdominal hemorrhage.   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 P:   - Pain control. - embolization per IR plans  HEMATOLOGIC A:  Hemorrhagic shock, coagulopathic and massive transfusion. P:  - follow CBC and INR  INFECTIOUS A:  No active issues. P:   - Monitor fever curve and WBC.  ENDOCRINE A:  No history of diabetes.   P:   - follow CBG   NEUROLOGIC A:  No active issues. P:   - Monitor neuro status with narcotics.  OPTHO: P: - restart xalatan for his glaucoma  I have personally obtained a history, examined the patient, evaluated laboratory and imaging results, formulated the assessment and plan and placed orders.  CRITICAL CARE: The patient is critically ill with multiple organ systems failure and requires high complexity decision making for assessment and support, frequent evaluation and titration of therapies, application of advanced monitoring technologies and extensive interpretation of multiple databases. Critical Care Time devoted to patient care services described in this note is 30 minutes.   Levy Pupa, MD, PhD 08/05/2012, 10:24 AM [facility] [phone] or if no answer [phone]

## 2012-08-05 NOTE — Progress Notes (Signed)
eLink Physician-Brief Progress Note Patient Name: Sean Stanley DOB: [birth year] MRN: [id number]  Date of Service  08/05/2012   HPI/Events of Note  Pt with ongoing nausea   eICU Interventions  Promethazine prn ordered    Intervention Category Minor Interventions: Routine modifications to care plan (e.g. PRN medications for pain, fever)  Shan Levans 08/05/2012, 9:23 PM

## 2012-08-05 NOTE — Procedures (Signed)
SMA ANGIO and attempted microcatheterization of inferior panc branches unsuccesssful No comp Stable Full report in PACS

## 2012-08-05 NOTE — ED Notes (Signed)
Vital

## 2012-08-05 NOTE — ED Notes (Signed)
Right foot pink and warm with 3+ DP and 2+ PT

## 2012-08-05 NOTE — Progress Notes (Addendum)
Subjective: Abdominal pain somewhat improved, feeling much better overall  Objective: Vital signs in last 24 hours: Temp:  [96.3 F (35.7 C)-98.4 F (36.9 C)] 98.4 F (36.9 C) (05/14 0753) Pulse Rate:  [51-111] 96 (05/14 0800) Resp:  [13-28] 22 (05/14 0800) BP: (91-149)/(48-103) 144/77 mmHg (05/14 0800) SpO2:  [93 %-100 %] 96 % (05/14 0800) Weight:  [98.431 kg (217 lb)-110 kg (242 lb 8.1 oz)] 110 kg (242 lb 8.1 oz) (05/14 0600)    Intake/Output from previous day: 05/13 0701 - 05/14 0700 In: 1862.7 [I.V.:240; Blood:1551.7; IV Piggyback:71] Out: 890 [Urine:890] Intake/Output this shift: Total I/O In: 20 [I.V.:20] Out: 150 [Urine:150]  General appearance: alert and cooperative Resp: clear to auscultation bilaterally Cardio: regular rate and rhythm GI: soft, still quite tender RUQ, RLQ but less than yesterday, L abdomen NT, no generalized peritonitis, FEW BS Neurologic: Mental status: Alert, oriented, thought content appropriate  Lab Results:   Recent Labs  08/05/12 0004 08/05/12 0400  WBC 10.5 10.9*  HGB 7.6* 7.7*  HCT 21.3* 21.8*  PLT 132* 132*   BMET  Recent Labs  08/04/12 1701 08/05/12 0004  NA 137 138  K 4.2 4.2  CL 104 106  CO2 20 23  GLUCOSE 248* 148*  BUN 22 28*  CREATININE 0.98 1.29  CALCIUM 7.2* 7.4*   PT/INR  Recent Labs  08/04/12 1701 08/05/12 0004  LABPROT 14.0 16.2*  INR 1.09 1.33   ABG  Recent Labs  08/04/12 1720  PHART 7.295*  HCO3 15.1*    Studies/Results: Ct Abdomen Pelvis Wo Contrast  08/04/2012   *RADIOLOGY REPORT*  Clinical Data:  Abdominal pain, diaphoretic, hypotensive, evaluate for ruptured AAA  CT CHEST, ABDOMEN AND PELVIS WITHOUT CONTRAST  Technique:  Multidetector CT imaging of the chest, abdomen and pelvis was performed following the standard protocol without IV contrast.  Comparison:   None.  CT CHEST  Findings:  Mild subpleural reticulation/fibrosis in the lungs bilaterally, possibly reflecting mild chronic  interstitial lung disease.  No suspicious pulmonary nodules.  No pleural effusion or pneumothorax.  Visualized thyroid is unremarkable.  The heart is mildly enlarged.  Hypodense blood pole relative to myocardium, suggesting anemia.  No pericardial effusion. Atherosclerotic calcifications of the aortic arch.  Small mediastinal lymph nodes which do not meet pathologic CT size criteria.  No suspicious axillary lymphadenopathy.  Degenerative changes of the thoracic spine.  IMPRESSION: No evidence of acute cardiopulmonary disease.  CT ABDOMEN AND PELVIS  Findings:  Liver, spleen, and adrenal glands are within normal limits.  Pancreatic head/uncinate process is displaced anteriorly but otherwise unremarkable.  Gallbladder is unremarkable.  No intrahepatic or extrahepatic ductal dilatation.  Kidneys are unremarkable.  No hydronephrosis.  Large hematoma centered in the right upper abdomen measuring approximately 9.8 x 19.3 cm (series 3/image 81).  This is difficult to separate from the second and third portions of the duodenum as well as the distal celiac artery.  Hemorrhage extends inferiorly just anterior to the right common iliac artery (series 3/image 94) and into the right pelvis (series 3/image 103).  No evidence of abdominal aortic aneurysm.  A fat plane separates the collection from the abdominal aorta and the SMA.  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  IVC is narrowed/slit-like (series 3/image 84), possibly reflecting hypovolemia.  No evidence of bowel obstruction.  Prostatomegaly, measuring 5.8 cm in transverse dimension.  Bladder is within normal limits.  Small volume pelvic ascites.  Small fat-containing left inguinal hernia.  Degenerative changes of the lumbar spine.  IMPRESSION: No evidence of abdominal aortic aneurysm.  Large hematoma centered in the right upper abdomen, as described above, etiology unclear.  Emergent surgical consultation is  suggested.  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  Additional ancillary findings as above.  These results were called by telephone on 08/04/2012 at 1415 hours to Dr Judd Lien, who verbally acknowledged these results.   Original Report Authenticated By: Charline Bills, M.D.   Ct Chest Wo Contrast  08/04/2012   *RADIOLOGY REPORT*  Clinical Data:  Abdominal pain, diaphoretic, hypotensive, evaluate for ruptured AAA  CT CHEST, ABDOMEN AND PELVIS WITHOUT CONTRAST  Technique:  Multidetector CT imaging of the chest, abdomen and pelvis was performed following the standard protocol without IV contrast.  Comparison:   None.  CT CHEST  Findings:  Mild subpleural reticulation/fibrosis in the lungs bilaterally, possibly reflecting mild chronic interstitial lung disease.  No suspicious pulmonary nodules.  No pleural effusion or pneumothorax.  Visualized thyroid is unremarkable.  The heart is mildly enlarged.  Hypodense blood pole relative to myocardium, suggesting anemia.  No pericardial effusion. Atherosclerotic calcifications of the aortic arch.  Small mediastinal lymph nodes which do not meet pathologic CT size criteria.  No suspicious axillary lymphadenopathy.  Degenerative changes of the thoracic spine.  IMPRESSION: No evidence of acute cardiopulmonary disease.  CT ABDOMEN AND PELVIS  Findings:  Liver, spleen, and adrenal glands are within normal limits.  Pancreatic head/uncinate process is displaced anteriorly but otherwise unremarkable.  Gallbladder is unremarkable.  No intrahepatic or extrahepatic ductal dilatation.  Kidneys are unremarkable.  No hydronephrosis.  Large hematoma centered in the right upper abdomen measuring approximately 9.8 x 19.3 cm (series 3/image 81).  This is difficult to separate from the second and third portions of the duodenum as well as the distal celiac artery.  Hemorrhage extends inferiorly just anterior to the right common iliac artery (series 3/image 94)  and into the right pelvis (series 3/image 103).  No evidence of abdominal aortic aneurysm.  A fat plane separates the collection from the abdominal aorta and the SMA.  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  IVC is narrowed/slit-like (series 3/image 84), possibly reflecting hypovolemia.  No evidence of bowel obstruction.  Prostatomegaly, measuring 5.8 cm in transverse dimension.  Bladder is within normal limits.  Small volume pelvic ascites.  Small fat-containing left inguinal hernia.  Degenerative changes of the lumbar spine.  IMPRESSION: No evidence of abdominal aortic aneurysm.  Large hematoma centered in the right upper abdomen, as described above, etiology unclear.  Emergent surgical consultation is suggested.  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  Additional ancillary findings as above.  These results were called by telephone on 08/04/2012 at 1415 hours to Dr Judd Lien, who verbally acknowledged these results.   Original Report Authenticated By: Charline Bills, M.D.   Dg Chest Port 1 View  08/04/2012   *RADIOLOGY REPORT*  Clinical Data: Central line placement  PORTABLE CHEST - 1 VIEW  Comparison: None.  Findings: Cardiomediastinal silhouette is unremarkable.  There is right IJ central line with tip in distal SVC.  No diagnostic pneumothorax.  No acute infiltrate or pulmonary edema.  IMPRESSION: Right IJ central line in place.  No diagnostic pneumothorax.   Original Report Authenticated By: Natasha Mead, M.D.   Ct Angio Abd/pel W/ And/or W/o  08/04/2012   *RADIOLOGY REPORT*  Clinical Data:  Abdominal bleeding  on prior noncontrast CT. Evaluate for source of bleed.  CT ANGIOGRAPHY ABDOMEN AND PELVIS  Technique:  Multidetector CT imaging of the abdomen and pelvis was performed using the standard protocol during bolus administration of intravenous contrast.  Multiplanar reconstructed images including MIPs were obtained  and reviewed to evaluate the vascular anatomy.  Contrast: OMNIPAQUE IOHEXOL 350 MG/ML SOLN  Comparison:  Noncontrast CT earlier today.  Findings:  There is a large area of hemorrhage seen in the right abdomen.  This measures approximately 22 x 12 cm compared with 16.5 x 10 cm previously.  Following contrast administration, there is evidence of a small pseudoaneurysm likely from a pancreaticoduodenal branch in the region of the posterior pancreatic head (image 93 of series 4 and image 34 of series 603). This may be related to prior bouts of pancreatitis.  Liver, gallbladder, spleen, pancreas, adrenals and kidneys demonstrate no focal abnormality.  There is a small amount of free fluid around the liver and spleen.  Stranding noted around the pancreas which could be related to pancreatitis or blood.  There is fluid in the transverse mesocolon also could be from pancreatitis or blood.  Moderate free fluid in the pelvis.  Large and small bowel are grossly unremarkable except for scattered colonic diverticula.  Small bowel is decompressed.  Aorta is normal caliber.  Scattered aortic calcifications.  No dissection.  No acute bony abnormality.  Degenerative changes in the lumbar spine.   Review of the MIP images confirms the above findings.  IMPRESSION: Large hematoma within the abdomen measuring up to 22 cm, increased from 16 cm previously.  The source of the bleeding appears to be a pseudoaneurysm likely arising from a pancreaticoduodenal branch within the posterior pancreatic head.  This may be related to prior bouts of pancreatitis.  Moderate free fluid in the abdomen or pelvis.  Stranding around the pancreas and in the transverse mesocolon, question blood versus changes related to pancreatitis.  Recommend clinical correlation for elevated pancreatic enzymes.   Original Report Authenticated By: Charlett Nose, M.D.    Anti-infectives: Anti-infectives   None      Assessment/Plan: Spontaneous intraperitoneal  hemorrhage from PDA pseudoaneurysm, on xarelto - amylase and lipase WNL, for angioembolization today, I D/W Dr. Myra Gianotti and he is speaking with IR.  We will continue to follow closely.  Dr. Royann Shivers to see regarding need for further anticoagulaiton after D/C ABL anemia and hemorrhagic shock - improved after resuscitation, appreciate CCM management I spoke to the patient at length regarding the plan of care  LOS: 1 day    Cherri Yera E 08/05/2012

## 2012-08-05 NOTE — Progress Notes (Signed)
Patient ID: Sean Stanley, male   DOB: [birth year], 72 y.o.   MRN: [id number] Request received for mesenteric arteriogram with possible embolization of pseudoaneurysm of pancreaticoduodenal artery branch in pt with spontaneous intraperitoneal bleed. Additional PMH as below. Imaging studies were reviewed by Dr. Miles Costain.  Exam: Pt awake /alert; c/o intermittent hot flashes and mild RUQ/RLQ discomfort; chest- CTA bilat; heart- RRR; abd- soft, mildly dist., +BS, mild-mod tender RUQ/RLQ to palpation; ext- no edema, intact distal pulses.    Filed Vitals:   08/05/12 0753 08/05/12 0800 08/05/12 0900 08/05/12 1000  BP:  144/77 123/81 161/96  Pulse:  96 89 89  Temp: 98.4 F (36.9 C)     TempSrc: Oral     Resp:  22 18 19   Weight:      SpO2:  96% 95% 96%   Past Medical History  Diagnosis Date  . Atrial flutter   . Hypertension   . Hypercholesteremia   . Mitral valve disease     annuloplasty 2002 [facility]  . Carotid stenosis     03/10/2007 right & left  bulbs & ICAs 0-49% reduction  ,right subclavian 50% reduction   Past Surgical History  Procedure Laterality Date  . Mitral valve repair  2002    [facility]  . Tee without cardioversion  02/12/2012    Procedure: TRANSESOPHAGEAL ECHOCARDIOGRAM (TEE);  Surgeon: [name], MD;  Location: [facility] ENDOSCOPY;  Service: Cardiovascular;  Laterality: N/A;  . Cardioversion  02/12/2012    Procedure: CARDIOVERSION;  Surgeon: [name], MD;  Location: [facility] ENDOSCOPY;  Service: Cardiovascular;  Laterality: N/A;  . Right heart cath  06/19/2004    normal right heart dynamics. EF 50%  . Nm myoview ltd  07/22/2006    no ischemia   Ct Abdomen Pelvis Wo Contrast  08/04/2012   *RADIOLOGY REPORT*  Clinical Data:  Abdominal pain, diaphoretic, hypotensive, evaluate for ruptured AAA  CT CHEST, ABDOMEN AND PELVIS WITHOUT CONTRAST  Technique:  Multidetector CT imaging of the chest, abdomen and pelvis was performed following the standard protocol without IV contrast.  Comparison:    None.  CT CHEST  Findings:  Mild subpleural reticulation/fibrosis in the lungs bilaterally, possibly reflecting mild chronic interstitial lung disease.  No suspicious pulmonary nodules.  No pleural effusion or pneumothorax.  Visualized thyroid is unremarkable.  The heart is mildly enlarged.  Hypodense blood pole relative to myocardium, suggesting anemia.  No pericardial effusion. Atherosclerotic calcifications of the aortic arch.  Small mediastinal lymph nodes which do not meet pathologic CT size criteria.  No suspicious axillary lymphadenopathy.  Degenerative changes of the thoracic spine.  IMPRESSION: No evidence of acute cardiopulmonary disease.  CT ABDOMEN AND PELVIS  Findings:  Liver, spleen, and adrenal glands are within normal limits.  Pancreatic head/uncinate process is displaced anteriorly but otherwise unremarkable.  Gallbladder is unremarkable.  No intrahepatic or extrahepatic ductal dilatation.  Kidneys are unremarkable.  No hydronephrosis.  Large hematoma centered in the right upper abdomen measuring approximately 9.8 x 19.3 cm (series 3/image 81).  This is difficult to separate from the second and third portions of the duodenum as well as the distal celiac artery.  Hemorrhage extends inferiorly just anterior to the right common iliac artery (series 3/image 94) and into the right pelvis (series 3/image 103).  No evidence of abdominal aortic aneurysm.  A fat plane separates the collection from the abdominal aorta and the SMA.  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  IVC is narrowed/slit-like (series 3/image 84), possibly reflecting hypovolemia.  No evidence of bowel obstruction.  Prostatomegaly, measuring 5.8 cm in transverse dimension.  Bladder is within normal limits.  Small volume pelvic ascites.  Small fat-containing left inguinal hernia.  Degenerative changes of the lumbar spine.  IMPRESSION: No evidence of abdominal aortic aneurysm.  Large  hematoma centered in the right upper abdomen, as described above, etiology unclear.  Emergent surgical consultation is suggested.  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  Additional ancillary findings as above.  These results were called by telephone on 08/04/2012 at 1415 hours to Dr Judd Lien, who verbally acknowledged these results.   Original Report Authenticated By: Charline Bills, M.D.   Ct Chest Wo Contrast  08/04/2012   *RADIOLOGY REPORT*  Clinical Data:  Abdominal pain, diaphoretic, hypotensive, evaluate for ruptured AAA  CT CHEST, ABDOMEN AND PELVIS WITHOUT CONTRAST  Technique:  Multidetector CT imaging of the chest, abdomen and pelvis was performed following the standard protocol without IV contrast.  Comparison:   None.  CT CHEST  Findings:  Mild subpleural reticulation/fibrosis in the lungs bilaterally, possibly reflecting mild chronic interstitial lung disease.  No suspicious pulmonary nodules.  No pleural effusion or pneumothorax.  Visualized thyroid is unremarkable.  The heart is mildly enlarged.  Hypodense blood pole relative to myocardium, suggesting anemia.  No pericardial effusion. Atherosclerotic calcifications of the aortic arch.  Small mediastinal lymph nodes which do not meet pathologic CT size criteria.  No suspicious axillary lymphadenopathy.  Degenerative changes of the thoracic spine.  IMPRESSION: No evidence of acute cardiopulmonary disease.  CT ABDOMEN AND PELVIS  Findings:  Liver, spleen, and adrenal glands are within normal limits.  Pancreatic head/uncinate process is displaced anteriorly but otherwise unremarkable.  Gallbladder is unremarkable.  No intrahepatic or extrahepatic ductal dilatation.  Kidneys are unremarkable.  No hydronephrosis.  Large hematoma centered in the right upper abdomen measuring approximately 9.8 x 19.3 cm (series 3/image 81).  This is difficult to separate from the second and third portions of the duodenum as well as the  distal celiac artery.  Hemorrhage extends inferiorly just anterior to the right common iliac artery (series 3/image 94) and into the right pelvis (series 3/image 103).  No evidence of abdominal aortic aneurysm.  A fat plane separates the collection from the abdominal aorta and the SMA.  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  IVC is narrowed/slit-like (series 3/image 84), possibly reflecting hypovolemia.  No evidence of bowel obstruction.  Prostatomegaly, measuring 5.8 cm in transverse dimension.  Bladder is within normal limits.  Small volume pelvic ascites.  Small fat-containing left inguinal hernia.  Degenerative changes of the lumbar spine.  IMPRESSION: No evidence of abdominal aortic aneurysm.  Large hematoma centered in the right upper abdomen, as described above, etiology unclear.  Emergent surgical consultation is suggested.  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  Additional ancillary findings as above.  These results were called by telephone on 08/04/2012 at 1415 hours to Dr Judd Lien, who verbally acknowledged these results.   Original Report Authenticated By: Charline Bills, M.D.   Dg Chest Port 1 View  08/04/2012   *RADIOLOGY REPORT*  Clinical Data: Central line placement  PORTABLE CHEST - 1 VIEW  Comparison: None.  Findings: Cardiomediastinal silhouette is unremarkable.  There is right IJ central line with tip in distal SVC.  No diagnostic pneumothorax.  No acute infiltrate or pulmonary edema.  IMPRESSION: Right IJ central line in place.  No diagnostic pneumothorax.   Original Report Authenticated By: Natasha Mead, M.D.   Ct Angio Abd/pel W/ And/or W/o  08/04/2012   *RADIOLOGY REPORT*  Clinical Data:  Abdominal bleeding on prior noncontrast CT. Evaluate for source of bleed.  CT ANGIOGRAPHY ABDOMEN AND PELVIS  Technique:  Multidetector CT imaging of the abdomen and pelvis was performed using the standard  protocol during bolus administration of intravenous contrast.  Multiplanar reconstructed images including MIPs were obtained and reviewed to evaluate the vascular anatomy.  Contrast: OMNIPAQUE IOHEXOL 350 MG/ML SOLN  Comparison:  Noncontrast CT earlier today.  Findings:  There is a large area of hemorrhage seen in the right abdomen.  This measures approximately 22 x 12 cm compared with 16.5 x 10 cm previously.  Following contrast administration, there is evidence of a small pseudoaneurysm likely from a pancreaticoduodenal branch in the region of the posterior pancreatic head (image 93 of series 4 and image 34 of series 603). This may be related to prior bouts of pancreatitis.  Liver, gallbladder, spleen, pancreas, adrenals and kidneys demonstrate no focal abnormality.  There is a small amount of free fluid around the liver and spleen.  Stranding noted around the pancreas which could be related to pancreatitis or blood.  There is fluid in the transverse mesocolon also could be from pancreatitis or blood.  Moderate free fluid in the pelvis.  Large and small bowel are grossly unremarkable except for scattered colonic diverticula.  Small bowel is decompressed.  Aorta is normal caliber.  Scattered aortic calcifications.  No dissection.  No acute bony abnormality.  Degenerative changes in the lumbar spine.   Review of the MIP images confirms the above findings.  IMPRESSION: Large hematoma within the abdomen measuring up to 22 cm, increased from 16 cm previously.  The source of the bleeding appears to be a pseudoaneurysm likely arising from a pancreaticoduodenal branch within the posterior pancreatic head.  This may be related to prior bouts of pancreatitis.  Moderate free fluid in the abdomen or pelvis.  Stranding around the pancreas and in the transverse mesocolon, question blood versus changes related to pancreatitis.  Recommend clinical correlation for elevated pancreatic enzymes.   Original Report Authenticated  By: Charlett Nose, M.D.  Results for orders placed during the hospital encounter of 08/04/12  MRSA PCR SCREENING      Result Value Range   MRSA by PCR NEGATIVE  NEGATIVE  COMPREHENSIVE METABOLIC PANEL      Result Value Range   Sodium 137  135 - 145 mEq/L   Potassium 3.6  3.5 - 5.1 mEq/L   Chloride 103  96 - 112 mEq/L   CO2 21  19 - 32 mEq/L   Glucose, Bld 151 (*) 70 - 99 mg/dL   BUN 22  6 - 23 mg/dL   Creatinine, Ser 1.30  0.50 - 1.35 mg/dL   Calcium 8.8  8.4 - 86.5 mg/dL   Total Protein 6.2  6.0 - 8.3 g/dL   Albumin 3.5  3.5 - 5.2 g/dL   AST 17  0 - 37 U/L   ALT 12  0 - 53 U/L   Alkaline Phosphatase 58  39 - 117 U/L   Total Bilirubin 1.8 (*) 0.3 - 1.2 mg/dL   GFR calc non Af Amer 66 (*) >90 mL/min   GFR calc Af Amer 76 (*) >90 mL/min  CBC WITH DIFFERENTIAL      Result Value Range  WBC 9.1  4.0 - 10.5 K/uL   RBC 4.12 (*) 4.22 - 5.81 MIL/uL   Hemoglobin 12.4 (*) 13.0 - 17.0 g/dL   HCT 16.1 (*) 09.6 - 04.5 %   MCV 88.1  78.0 - 100.0 fL   MCH 30.1  26.0 - 34.0 pg   MCHC 34.2  30.0 - 36.0 g/dL   RDW 40.9  81.1 - 91.4 %   Platelets 206  150 - 400 K/uL   Neutrophils Relative % 82 (*) 43 - 77 %   Neutro Abs 7.4  1.7 - 7.7 K/uL   Lymphocytes Relative 10 (*) 12 - 46 %   Lymphs Abs 0.9  0.7 - 4.0 K/uL   Monocytes Relative 7  3 - 12 %   Monocytes Absolute 0.7  0.1 - 1.0 K/uL   Eosinophils Relative 1  0 - 5 %   Eosinophils Absolute 0.1  0.0 - 0.7 K/uL   Basophils Relative 0  0 - 1 %   Basophils Absolute 0.0  0.0 - 0.1 K/uL  CBC WITH DIFFERENTIAL      Result Value Range   WBC 15.6 (*) 4.0 - 10.5 K/uL   RBC 3.65 (*) 4.22 - 5.81 MIL/uL   Hemoglobin 10.6 (*) 13.0 - 17.0 g/dL   HCT 78.2 (*) 95.6 - 21.3 %   MCV 87.7  78.0 - 100.0 fL   MCH 29.0  26.0 - 34.0 pg   MCHC 33.1  30.0 - 36.0 g/dL   RDW 08.6  57.8 - 46.9 %   Platelets 173  150 - 400 K/uL   Neutrophils Relative % 87 (*) 43 - 77 %   Lymphocytes Relative 6 (*) 12 - 46 %   Monocytes Relative 7  3 - 12 %   Eosinophils  Relative 0  0 - 5 %   Basophils Relative 0  0 - 1 %   Neutro Abs 13.6 (*) 1.7 - 7.7 K/uL   Lymphs Abs 0.9  0.7 - 4.0 K/uL   Monocytes Absolute 1.1 (*) 0.1 - 1.0 K/uL   Eosinophils Absolute 0.0  0.0 - 0.7 K/uL   Basophils Absolute 0.0  0.0 - 0.1 K/uL   WBC Morphology FEW NEUTROPHIL BANDS NOTED    COMPREHENSIVE METABOLIC PANEL      Result Value Range   Sodium 137  135 - 145 mEq/L   Potassium 3.6  3.5 - 5.1 mEq/L   Chloride 106  96 - 112 mEq/L   CO2 18 (*) 19 - 32 mEq/L   Glucose, Bld 192 (*) 70 - 99 mg/dL   BUN 22  6 - 23 mg/dL   Creatinine, Ser 6.29  0.50 - 1.35 mg/dL   Calcium 7.6 (*) 8.4 - 10.5 mg/dL   Total Protein 4.6 (*) 6.0 - 8.3 g/dL   Albumin 2.4 (*) 3.5 - 5.2 g/dL   AST 13  0 - 37 U/L   ALT 9  0 - 53 U/L   Alkaline Phosphatase 44  39 - 117 U/L   Total Bilirubin 1.6 (*) 0.3 - 1.2 mg/dL   GFR calc non Af Amer 70 (*) >90 mL/min   GFR calc Af Amer 81 (*) >90 mL/min  APTT      Result Value Range   aPTT 35  24 - 37 seconds  PROTIME-INR      Result Value Range   Prothrombin Time 20.1 (*) 11.6 - 15.2 seconds   INR 1.78 (*) 0.00 - 1.49  URINALYSIS, ROUTINE W  REFLEX MICROSCOPIC      Result Value Range   Color, Urine AMBER (*) YELLOW   APPearance CLEAR  CLEAR   Specific Gravity, Urine 1.025  1.005 - 1.030   pH 6.0  5.0 - 8.0   Glucose, UA NEGATIVE  NEGATIVE mg/dL   Hgb urine dipstick SMALL (*) NEGATIVE   Bilirubin Urine NEGATIVE  NEGATIVE   Ketones, ur 40 (*) NEGATIVE mg/dL   Protein, ur 161 (*) NEGATIVE mg/dL   Urobilinogen, UA 0.2  0.0 - 1.0 mg/dL   Nitrite NEGATIVE  NEGATIVE   Leukocytes, UA NEGATIVE  NEGATIVE  CBC WITH DIFFERENTIAL      Result Value Range   WBC 14.6 (*) 4.0 - 10.5 K/uL   RBC 3.89 (*) 4.22 - 5.81 MIL/uL   Hemoglobin 11.8 (*) 13.0 - 17.0 g/dL   HCT 09.6 (*) 04.5 - 40.9 %   MCV 86.9  78.0 - 100.0 fL   MCH 30.3  26.0 - 34.0 pg   MCHC 34.9  30.0 - 36.0 g/dL   RDW 81.1  91.4 - 78.2 %   Platelets 144 (*) 150 - 400 K/uL   Neutrophils Relative % 86  (*) 43 - 77 %   Neutro Abs 12.6 (*) 1.7 - 7.7 K/uL   Lymphocytes Relative 7 (*) 12 - 46 %   Lymphs Abs 1.0  0.7 - 4.0 K/uL   Monocytes Relative 7  3 - 12 %   Monocytes Absolute 1.0  0.1 - 1.0 K/uL   Eosinophils Relative 0  0 - 5 %   Eosinophils Absolute 0.0  0.0 - 0.7 K/uL   Basophils Relative 0  0 - 1 %   Basophils Absolute 0.0  0.0 - 0.1 K/uL  COMPREHENSIVE METABOLIC PANEL      Result Value Range   Sodium 137  135 - 145 mEq/L   Potassium 4.2  3.5 - 5.1 mEq/L   Chloride 104  96 - 112 mEq/L   CO2 20  19 - 32 mEq/L   Glucose, Bld 248 (*) 70 - 99 mg/dL   BUN 22  6 - 23 mg/dL   Creatinine, Ser 9.56  0.50 - 1.35 mg/dL   Calcium 7.2 (*) 8.4 - 10.5 mg/dL   Total Protein 4.1 (*) 6.0 - 8.3 g/dL   Albumin 2.3 (*) 3.5 - 5.2 g/dL   AST 12  0 - 37 U/L   ALT 9  0 - 53 U/L   Alkaline Phosphatase 41  39 - 117 U/L   Total Bilirubin 3.0 (*) 0.3 - 1.2 mg/dL   GFR calc non Af Amer 81 (*) >90 mL/min   GFR calc Af Amer >90  >90 mL/min  APTT      Result Value Range   aPTT 30  24 - 37 seconds  PROTIME-INR      Result Value Range   Prothrombin Time 14.0  11.6 - 15.2 seconds   INR 1.09  0.00 - 1.49  LACTIC ACID, PLASMA      Result Value Range   Lactic Acid, Venous 7.1 (*) 0.5 - 2.2 mmol/L  CORTISOL      Result Value Range   Cortisol, Plasma 46.9    PRO B NATRIURETIC PEPTIDE      Result Value Range   Pro B Natriuretic peptide (BNP) 235.1 (*) 0 - 125 pg/mL  MAGNESIUM      Result Value Range   Magnesium 1.5  1.5 - 2.5 mg/dL  PHOSPHORUS  Result Value Range   Phosphorus 4.1  2.3 - 4.6 mg/dL  URINE MICROSCOPIC-ADD ON      Result Value Range   Squamous Epithelial / LPF RARE  RARE   WBC, UA 0-2  <3 WBC/hpf   RBC / HPF 3-6  <3 RBC/hpf   Bacteria, UA RARE  RARE   Casts HYALINE CASTS (*) NEGATIVE   Urine-Other AMORPHOUS URATES/PHOSPHATES    CBC      Result Value Range   WBC 11.8 (*) 4.0 - 10.5 K/uL   RBC 2.61 (*) 4.22 - 5.81 MIL/uL   Hemoglobin 7.8 (*) 13.0 - 17.0 g/dL   HCT 78.2 (*)  95.6 - 52.0 %   MCV 83.9  78.0 - 100.0 fL   MCH 29.9  26.0 - 34.0 pg   MCHC 35.6  30.0 - 36.0 g/dL   RDW 21.3  08.6 - 57.8 %   Platelets 138 (*) 150 - 400 K/uL  CBC      Result Value Range   WBC 10.9 (*) 4.0 - 10.5 K/uL   RBC 2.59 (*) 4.22 - 5.81 MIL/uL   Hemoglobin 7.7 (*) 13.0 - 17.0 g/dL   HCT 46.9 (*) 62.9 - 52.8 %   MCV 84.2  78.0 - 100.0 fL   MCH 29.7  26.0 - 34.0 pg   MCHC 35.3  30.0 - 36.0 g/dL   RDW 41.3  24.4 - 01.0 %   Platelets 132 (*) 150 - 400 K/uL  AMYLASE      Result Value Range   Amylase 47  0 - 105 U/L  LIPASE, BLOOD      Result Value Range   Lipase 29  11 - 59 U/L  CBC      Result Value Range   WBC 10.5  4.0 - 10.5 K/uL   RBC 2.54 (*) 4.22 - 5.81 MIL/uL   Hemoglobin 7.6 (*) 13.0 - 17.0 g/dL   HCT 27.2 (*) 53.6 - 64.4 %   MCV 83.9  78.0 - 100.0 fL   MCH 29.9  26.0 - 34.0 pg   MCHC 35.7  30.0 - 36.0 g/dL   RDW 03.4  74.2 - 59.5 %   Platelets 132 (*) 150 - 400 K/uL  BASIC METABOLIC PANEL      Result Value Range   Sodium 138  135 - 145 mEq/L   Potassium 4.2  3.5 - 5.1 mEq/L   Chloride 106  96 - 112 mEq/L   CO2 23  19 - 32 mEq/L   Glucose, Bld 148 (*) 70 - 99 mg/dL   BUN 28 (*) 6 - 23 mg/dL   Creatinine, Ser 6.38  0.50 - 1.35 mg/dL   Calcium 7.4 (*) 8.4 - 10.5 mg/dL   GFR calc non Af Amer 54 (*) >90 mL/min   GFR calc Af Amer 63 (*) >90 mL/min  LACTIC ACID, PLASMA      Result Value Range   Lactic Acid, Venous 2.5 (*) 0.5 - 2.2 mmol/L  PROTIME-INR      Result Value Range   Prothrombin Time 16.2 (*) 11.6 - 15.2 seconds   INR 1.33  0.00 - 1.49  APTT      Result Value Range   aPTT 33  24 - 37 seconds  CG4 I-STAT (LACTIC ACID)      Result Value Range   Lactic Acid, Venous 4.34 (*) 0.5 - 2.2 mmol/L  POCT I-STAT TROPONIN I      Result Value Range  Troponin i, poc 0.03  0.00 - 0.08 ng/mL   Comment 3           POCT I-STAT 3, BLOOD GAS (G3+)      Result Value Range   pH, Arterial 7.295 (*) 7.350 - 7.450   pCO2 arterial 30.8 (*) 35.0 - 45.0 mmHg    pO2, Arterial 78.0 (*) 80.0 - 100.0 mmHg   Bicarbonate 15.1 (*) 20.0 - 24.0 mEq/L   TCO2 16  0 - 100 mmol/L   O2 Saturation 95.0     Acid-base deficit 11.0 (*) 0.0 - 2.0 mmol/L   Patient temperature 36.0 C     Collection site RADIAL, ALLEN'S TEST ACCEPTABLE     Drawn by Operator     Sample type ARTERIAL    PREPARE RBC (CROSSMATCH)      Result Value Range   Order Confirmation ORDER PROCESSED BY BLOOD BANK    TYPE AND SCREEN      Result Value Range   ABO/RH(D) A POS     Antibody Screen NEG     Sample Expiration 08/07/2012     Unit Number [id number]     Blood Component Type RED CELLS,LR     Unit division 00     Status of Unit ISSUED     Unit tag comment VERBAL ORDERS PER DR DELOS     Transfusion Status OK TO TRANSFUSE     Crossmatch Result COMPATIBLE     Unit Number [id number]     Blood Component Type RED CELLS,LR     Unit division 00     Status of Unit ISSUED     Unit tag comment VERBAL ORDERS PER DR DELOS     Transfusion Status OK TO TRANSFUSE     Crossmatch Result COMPATIBLE    TYPE AND SCREEN      Result Value Range   ABO/RH(D) A POS     Antibody Screen NEG     Sample Expiration 08/07/2012     Unit Number [id number]     Blood Component Type RBC LR PHER2     Unit division 00     Status of Unit ISSUED     Unit tag comment VERBAL ORDERS PER DR YAO     Transfusion Status OK TO TRANSFUSE     Crossmatch Result COMPATIBLE     Unit Number [id number]     Blood Component Type RBC LR PHER2     Unit division 00     Status of Unit ISSUED     Unit tag comment VERBAL ORDERS PER DR YAO     Transfusion Status OK TO TRANSFUSE     Crossmatch Result COMPATIBLE     Unit Number [id number]     Blood Component Type RED CELLS,LR     Unit division 00     Status of Unit ISSUED     Unit tag comment VERBAL ORDERS PER DR YAO     Transfusion Status OK TO TRANSFUSE     Crossmatch Result COMPATIBLE     Unit Number [id number]     Blood Component Type RBC LR PHER1     Unit  division 00     Status of Unit ISSUED     Unit tag comment VERBAL ORDERS PER DR YAO     Transfusion Status OK TO TRANSFUSE     Crossmatch Result COMPATIBLE     Unit Number [id number]     Blood Component Type RED CELLS,LR     Unit  division 00     Status of Unit REL FROM [facility]     Unit tag comment VERBAL ORDERS PER DR YAO     Transfusion Status OK TO TRANSFUSE     Crossmatch Result NOT NEEDED     Unit Number [id number]     Blood Component Type RBC LR PHER1     Unit division 00     Status of Unit REL FROM [facility]     Unit tag comment VERBAL ORDERS PER DR YAO     Transfusion Status OK TO TRANSFUSE     Crossmatch Result NOT NEEDED     Unit Number [id number]     Blood Component Type RED CELLS,LR     Unit division 00     Status of Unit ALLOCATED     Transfusion Status OK TO TRANSFUSE     Crossmatch Result COMPATIBLE     Unit Number [id number]     Blood Component Type RED CELLS,LR     Unit division 00     Status of Unit ALLOCATED     Transfusion Status OK TO TRANSFUSE     Crossmatch Result COMPATIBLE     Unit Number [id number]     Blood Component Type RED CELLS,LR     Unit division 00     Status of Unit ALLOCATED     Transfusion Status OK TO TRANSFUSE     Crossmatch Result COMPATIBLE     Unit Number [id number]     Blood Component Type RED CELLS,LR     Unit division 00     Status of Unit ALLOCATED     Transfusion Status OK TO TRANSFUSE     Crossmatch Result COMPATIBLE     Unit Number [id number]     Blood Component Type RED CELLS,LR     Unit division 00     Status of Unit ALLOCATED     Transfusion Status OK TO TRANSFUSE     Crossmatch Result COMPATIBLE     Unit Number [id number]     Blood Component Type RED CELLS,LR     Unit division 00     Status of Unit ALLOCATED     Transfusion Status OK TO TRANSFUSE     Crossmatch Result COMPATIBLE     Unit Number [id number]     Blood Component Type RED CELLS,LR     Unit division 00     Status of Unit  ALLOCATED     Transfusion Status OK TO TRANSFUSE     Crossmatch Result Compatible     Unit Number [id number]     Blood Component Type RED CELLS,LR     Unit division 00     Status of Unit ALLOCATED     Transfusion Status OK TO TRANSFUSE     Crossmatch Result Compatible    PREPARE RBC (CROSSMATCH)      Result Value Range   Order Confirmation ORDER PROCESSED BY BLOOD BANK    PREPARE PLATELET PHERESIS      Result Value Range   Unit Number [id number]     Blood Component Type PLTPHER LR1     Unit division 00     Status of Unit ISSUED     Transfusion Status OK TO TRANSFUSE     Unit Number [id number]     Blood Component Type PLTPHER LR1     Unit division 00     Status of Unit ISSUED     Transfusion Status OK TO TRANSFUSE    PREPARE  FRESH FROZEN PLASMA      Result Value Range   Unit Number [id number]     Blood Component Type THAWED PLASMA     Unit division 00     Status of Unit ISSUED     Transfusion Status OK TO TRANSFUSE     Unit Number [id number]     Blood Component Type THAWED PLASMA     Unit division 00     Status of Unit ISSUED     Transfusion Status OK TO TRANSFUSE     Unit Number [id number]     Blood Component Type THAWED PLASMA     Unit division 00     Status of Unit ISSUED     Transfusion Status OK TO TRANSFUSE     Unit Number [id number]     Blood Component Type THAWED PLASMA     Unit division 00     Status of Unit ISSUED     Transfusion Status OK TO TRANSFUSE    ABO/RH      Result Value Range   ABO/RH(D) A POS    PREPARE RBC (CROSSMATCH)      Result Value Range   Order Confirmation ORDER PROCESSED BY BLOOD BANK     A/P: Pt with spontaneous intraperitoneal bleed and 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 Plan is for mesenteric arteriogram with possible stenting/ embolization of pseudoaneurysm today. Details/risks of procedure d/w pt/family with their understanding and consent. Will premedicate pt with IV benadryl,  solumedrol/pepcid due to contrast allergy and recheck renal function this am prior to start of case.

## 2012-08-06 ENCOUNTER — Inpatient Hospital Stay (HOSPITAL_COMMUNITY): Payer: Medicare Other

## 2012-08-06 DIAGNOSIS — I739 Peripheral vascular disease, unspecified: Secondary | ICD-10-CM | POA: Diagnosis present

## 2012-08-06 DIAGNOSIS — I059 Rheumatic mitral valve disease, unspecified: Secondary | ICD-10-CM | POA: Diagnosis not present

## 2012-08-06 DIAGNOSIS — I4892 Unspecified atrial flutter: Secondary | ICD-10-CM | POA: Diagnosis present

## 2012-08-06 DIAGNOSIS — IMO0001 Reserved for inherently not codable concepts without codable children: Secondary | ICD-10-CM

## 2012-08-06 DIAGNOSIS — I472 Ventricular tachycardia: Secondary | ICD-10-CM

## 2012-08-06 DIAGNOSIS — K922 Gastrointestinal hemorrhage, unspecified: Secondary | ICD-10-CM | POA: Diagnosis present

## 2012-08-06 DIAGNOSIS — R0989 Other specified symptoms and signs involving the circulatory and respiratory systems: Secondary | ICD-10-CM | POA: Diagnosis not present

## 2012-08-06 DIAGNOSIS — R58 Hemorrhage, not elsewhere classified: Secondary | ICD-10-CM | POA: Diagnosis not present

## 2012-08-06 DIAGNOSIS — R578 Other shock: Secondary | ICD-10-CM

## 2012-08-06 DIAGNOSIS — R0609 Other forms of dyspnea: Secondary | ICD-10-CM | POA: Diagnosis not present

## 2012-08-06 DIAGNOSIS — D689 Coagulation defect, unspecified: Secondary | ICD-10-CM | POA: Diagnosis not present

## 2012-08-06 DIAGNOSIS — T794XXA Traumatic shock, initial encounter: Secondary | ICD-10-CM | POA: Diagnosis not present

## 2012-08-06 DIAGNOSIS — R109 Unspecified abdominal pain: Secondary | ICD-10-CM | POA: Diagnosis not present

## 2012-08-06 DIAGNOSIS — D62 Acute posthemorrhagic anemia: Secondary | ICD-10-CM | POA: Diagnosis not present

## 2012-08-06 DIAGNOSIS — I4729 Other ventricular tachycardia: Secondary | ICD-10-CM | POA: Diagnosis not present

## 2012-08-06 DIAGNOSIS — Z91041 Radiographic dye allergy status: Secondary | ICD-10-CM | POA: Diagnosis present

## 2012-08-06 DIAGNOSIS — N289 Disorder of kidney and ureter, unspecified: Secondary | ICD-10-CM | POA: Diagnosis present

## 2012-08-06 LAB — CBC
Hemoglobin: 8.4 g/dL — ABNORMAL LOW (ref 13.0–17.0)
Platelets: 140 10*3/uL — ABNORMAL LOW (ref 150–400)
RBC: 2.81 MIL/uL — ABNORMAL LOW (ref 4.22–5.81)
RBC: 2.38 MIL/uL — ABNORMAL LOW (ref 4.22–5.81)
WBC: 15.2 10*3/uL — ABNORMAL HIGH (ref 4.0–10.5)

## 2012-08-06 LAB — BASIC METABOLIC PANEL
CO2: 23 mEq/L (ref 19–32)
CO2: 26 mEq/L (ref 19–32)
Calcium: 8.2 mg/dL — ABNORMAL LOW (ref 8.4–10.5)
Chloride: 107 mEq/L (ref 96–112)
Chloride: 109 mEq/L (ref 96–112)
Glucose, Bld: 148 mg/dL — ABNORMAL HIGH (ref 70–99)
Potassium: 4.7 mEq/L (ref 3.5–5.1)
Sodium: 141 mEq/L (ref 135–145)
Sodium: 143 mEq/L (ref 135–145)

## 2012-08-06 LAB — PROTIME-INR: Prothrombin Time: 15 seconds (ref 11.6–15.2)

## 2012-08-06 LAB — PHOSPHORUS: Phosphorus: 4.3 mg/dL (ref 2.3–4.6)

## 2012-08-06 MED ORDER — ALUM & MAG HYDROXIDE-SIMETH 200-200-20 MG/5ML PO SUSP
30.0000 mL | ORAL | Status: DC | PRN
  Administered 2012-08-06: 30 mL via ORAL
  Filled 2012-08-06 (×2): qty 30

## 2012-08-06 MED ORDER — METOPROLOL TARTRATE 1 MG/ML IV SOLN
5.0000 mg | Freq: Four times a day (QID) | INTRAVENOUS | Status: DC
  Administered 2012-08-06 – 2012-08-07 (×4): 5 mg via INTRAVENOUS
  Filled 2012-08-06 (×8): qty 5

## 2012-08-06 MED ORDER — ALFUZOSIN HCL ER 10 MG PO TB24
10.0000 mg | ORAL_TABLET | Freq: Every day | ORAL | Status: DC
  Administered 2012-08-06 – 2012-08-10 (×5): 10 mg via ORAL
  Filled 2012-08-06 (×6): qty 1

## 2012-08-06 MED ORDER — ONDANSETRON HCL 4 MG/2ML IJ SOLN: 4.0000 mg | Freq: Four times a day (QID) | INTRAMUSCULAR | Status: DC | PRN

## 2012-08-06 MED ORDER — ACETAMINOPHEN 325 MG PO TABS
650.0000 mg | ORAL_TABLET | ORAL | Status: DC | PRN
  Administered 2012-08-09 – 2012-08-10 (×3): 650 mg via ORAL
  Filled 2012-08-06 (×3): qty 2

## 2012-08-06 MED ORDER — FUROSEMIDE 10 MG/ML IJ SOLN
20.0000 mg | Freq: Once | INTRAMUSCULAR | Status: AC
  Administered 2012-08-06: 20 mg via INTRAVENOUS
  Filled 2012-08-06: qty 2

## 2012-08-06 NOTE — Progress Notes (Signed)
Subjective: Nausea and abdominal pain somewhat improved C/W yesterday  Objective: Vital signs in last 24 hours: Temp:  [97.7 F (36.5 C)-99 F (37.2 C)] 97.9 F (36.6 C) (05/15 0722) Pulse Rate:  [89-107] 90 (05/15 0900) Resp:  [11-27] 23 (05/15 0900) BP: (95-162)/(60-99) 142/74 mmHg (05/15 0900) SpO2:  [89 %-98 %] 94 % (05/15 0900) Weight:  [111.222 kg (245 lb 3.2 oz)] 111.222 kg (245 lb 3.2 oz) (05/15 0500)    Intake/Output from previous day: 05/14 0701 - 05/15 0700 In: 2500 [I.V.:1700; Blood:750; IV Piggyback:50] Out: 1095 [Urine:1095] Intake/Output this shift: Total I/O In: 200 [I.V.:200] Out: 35 [Urine:35]  General appearance: cooperative Resp: clear to auscultation bilaterally Cardio: regular rate and rhythm GI: soft, R abdominal tenderness but not guarding now, active BS Neuro: A&O, MAE, /C  Lab Results:   Recent Labs  08/05/12 2310 08/06/12 0400  WBC 16.5* 17.7*  HGB 8.5* 8.4*  HCT 24.4* 23.6*  PLT 150 156   BMET  Recent Labs  08/05/12 1000 08/06/12 0400  NA 140 141  K 4.6 4.7  CL 107 107  CO2 25 23  GLUCOSE 150* 148*  BUN 31* 46*  CREATININE 1.30 1.78*  CALCIUM 8.0* 8.2*   PT/INR  Recent Labs  08/05/12 1000 08/06/12 0400  LABPROT 16.8* 15.0  INR 1.40 1.20   ABG  Recent Labs  08/04/12 1720  PHART 7.295*  HCO3 15.1*    Studies/Results: Ct Abdomen Pelvis Wo Contrast  08/04/2012   *RADIOLOGY REPORT*  Clinical Data:  Abdominal pain, diaphoretic, hypotensive, evaluate for ruptured AAA  CT CHEST, ABDOMEN AND PELVIS WITHOUT CONTRAST  Technique:  Multidetector CT imaging of the chest, abdomen and pelvis was performed following the standard protocol without IV contrast.  Comparison:   None.  CT CHEST  Findings:  Mild subpleural reticulation/fibrosis in the lungs bilaterally, possibly reflecting mild chronic interstitial lung disease.  No suspicious pulmonary nodules.  No pleural effusion or pneumothorax.  Visualized thyroid is  unremarkable.  The heart is mildly enlarged.  Hypodense blood pole relative to myocardium, suggesting anemia.  No pericardial effusion. Atherosclerotic calcifications of the aortic arch.  Small mediastinal lymph nodes which do not meet pathologic CT size criteria.  No suspicious axillary lymphadenopathy.  Degenerative changes of the thoracic spine.  IMPRESSION: No evidence of acute cardiopulmonary disease.  CT ABDOMEN AND PELVIS  Findings:  Liver, spleen, and adrenal glands are within normal limits.  Pancreatic head/uncinate process is displaced anteriorly but otherwise unremarkable.  Gallbladder is unremarkable.  No intrahepatic or extrahepatic ductal dilatation.  Kidneys are unremarkable.  No hydronephrosis.  Large hematoma centered in the right upper abdomen measuring approximately 9.8 x 19.3 cm (series 3/image 81).  This is difficult to separate from the second and third portions of the duodenum as well as the distal celiac artery.  Hemorrhage extends inferiorly just anterior to the right common iliac artery (series 3/image 94) and into the right pelvis (series 3/image 103).  No evidence of abdominal aortic aneurysm.  A fat plane separates the collection from the abdominal aorta and the SMA.  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  IVC is narrowed/slit-like (series 3/image 84), possibly reflecting hypovolemia.  No evidence of bowel obstruction.  Prostatomegaly, measuring 5.8 cm in transverse dimension.  Bladder is within normal limits.  Small volume pelvic ascites.  Small fat-containing left inguinal hernia.  Degenerative changes of the lumbar spine.  IMPRESSION: No evidence of abdominal aortic aneurysm.  Large  hematoma centered in the right upper abdomen, as described above, etiology unclear.  Emergent surgical consultation is suggested.  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  Additional ancillary findings as above.   These results were called by telephone on 08/04/2012 at 1415 hours to Dr Judd Lien, who verbally acknowledged these results.   Original Report Authenticated By: Charline Bills, M.D.   Ct Chest Wo Contrast  08/04/2012   *RADIOLOGY REPORT*  Clinical Data:  Abdominal pain, diaphoretic, hypotensive, evaluate for ruptured AAA  CT CHEST, ABDOMEN AND PELVIS WITHOUT CONTRAST  Technique:  Multidetector CT imaging of the chest, abdomen and pelvis was performed following the standard protocol without IV contrast.  Comparison:   None.  CT CHEST  Findings:  Mild subpleural reticulation/fibrosis in the lungs bilaterally, possibly reflecting mild chronic interstitial lung disease.  No suspicious pulmonary nodules.  No pleural effusion or pneumothorax.  Visualized thyroid is unremarkable.  The heart is mildly enlarged.  Hypodense blood pole relative to myocardium, suggesting anemia.  No pericardial effusion. Atherosclerotic calcifications of the aortic arch.  Small mediastinal lymph nodes which do not meet pathologic CT size criteria.  No suspicious axillary lymphadenopathy.  Degenerative changes of the thoracic spine.  IMPRESSION: No evidence of acute cardiopulmonary disease.  CT ABDOMEN AND PELVIS  Findings:  Liver, spleen, and adrenal glands are within normal limits.  Pancreatic head/uncinate process is displaced anteriorly but otherwise unremarkable.  Gallbladder is unremarkable.  No intrahepatic or extrahepatic ductal dilatation.  Kidneys are unremarkable.  No hydronephrosis.  Large hematoma centered in the right upper abdomen measuring approximately 9.8 x 19.3 cm (series 3/image 81).  This is difficult to separate from the second and third portions of the duodenum as well as the distal celiac artery.  Hemorrhage extends inferiorly just anterior to the right common iliac artery (series 3/image 94) and into the right pelvis (series 3/image 103).  No evidence of abdominal aortic aneurysm.  A fat plane separates the collection  from the abdominal aorta and the SMA.  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  IVC is narrowed/slit-like (series 3/image 84), possibly reflecting hypovolemia.  No evidence of bowel obstruction.  Prostatomegaly, measuring 5.8 cm in transverse dimension.  Bladder is within normal limits.  Small volume pelvic ascites.  Small fat-containing left inguinal hernia.  Degenerative changes of the lumbar spine.  IMPRESSION: No evidence of abdominal aortic aneurysm.  Large hematoma centered in the right upper abdomen, as described above, etiology unclear.  Emergent surgical consultation is suggested.  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  Additional ancillary findings as above.  These results were called by telephone on 08/04/2012 at 1415 hours to Dr Judd Lien, who verbally acknowledged these results.   Original Report Authenticated By: Charline Bills, M.D.   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 Ruel Favors, M.D.  Medications:  Versed Fentanyl for conscious sedation  Guidance:  Ultrasound fluoroscopic  Fluoroscopy time:  35 minutes  Sedation time:  60 minutes  Contrast volume:  80 ml Omnipaque-300  Complications:  No immediate  PROCEDURE/FINDINGS:  Informed consent was obtained from the patient following explanation of the procedure, risks, benefits and alternatives. The patient understands, agrees  and consents for the procedure. All questions were addressed.  A time out was performed.  Maximal barrier sterile technique utilized including caps, mask, sterile gowns, sterile gloves, large sterile drape,  hand hygiene, and betadine  Under sterile conditions and local anesthesia, right common femoral artery micropuncture access was performed with ultrasound.  Images obtained for documentation.  5-French sheath inserted over a Bentson guide wire.  A Chung 2.5 reverse curve catheter was formed over the bifurcation.  This catheter was utilized to select the SMA.  Selective SMA angiogram performed.  SMA angiogram:  SMA main ileocolic trunk is patent.  There are patent jejunal and colic branches.  There is enlargement and collateralization of the inferior pancreaticoduodenal arcade.  In this region there is a visualized enlarged vascular structure peripherally which correlates with the pancreaticoduodenal pseudoaneurysm.  There is retrograde filling of the gastroduodenal artery supplying the celiac vascular territory.  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  Attempts were made to access the celiac origin over a guide wire however this was unsuccessful.  SMA origin was reselected.  Magnification oblique angiograms were performed of the SMA.  Prominent inferior pancreaticoduodenal collaterals are noted.  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  Utilizing a Renegade micro catheter and a G T glidewire, selective catheterization was performed peripherally of the enlarged pancreaticoduodenal arcade.  Selective injections were performed of this branch.  However this was not the feeding vessel to the pseudoaneurysm.  This is the dominant collateral territory supplying the G D A which retrograde fills the celiac territory. Because of these findings, embolization was not performed of this branch.  Microcatheter and guide wire were utilized to select a second dominant branch to the pancreaticoduodenal arcade.  Selective injection was performed.  This demonstrates a small retroperitoneal branch which is not supplying the pseudoaneurysm.  Additional attempts were made to select other branches to the  area however this was unsuccessful.  After additional catheter and guide wire combinations, no other branches could be selected.  At this point, the procedure was stopped.  The microcatheter and base catheter were removed.  Hemostasis obtained with an Exoseal device. No immediate complication.  The patient tolerated the procedure well.  IMPRESSION: SMA angiogram demonstrates a prominent pancreaticoduodenal arcade supplying the G D A in a retrograde fashion and the entire celiac vasculature.  This is secondary to a celiac origin significant stenosis which was not able to be accessed with catheters and guide wires.  Inferior pancreaticoduodenal pseudoaneurysm demonstrated angiographically however micro catheterization could not be performed peripherally of the area to perform embolization.   Original Report Authenticated By: Judie Petit. Miles Costain, M.D.   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 Ruel Favors, M.D.  Medications:  Versed Fentanyl for conscious sedation  Guidance:  Ultrasound fluoroscopic  Fluoroscopy time:  35 minutes  Sedation time:  60 minutes  Contrast volume:  80 ml Omnipaque-300  Complications:  No immediate  PROCEDURE/FINDINGS:  Informed consent was obtained from the patient following explanation of the procedure, risks, benefits and alternatives. The patient understands, agrees and consents for the procedure. All questions were addressed.  A time out was performed.  Maximal barrier sterile technique utilized including caps, mask, sterile gowns, sterile gloves, large sterile drape, hand hygiene,  and betadine  Under sterile conditions and local anesthesia, right common  femoral artery micropuncture access was performed with ultrasound.  Images obtained for documentation.  5-French sheath inserted over a Bentson guide wire.  A Chung 2.5 reverse curve catheter was formed over the bifurcation.  This catheter was utilized to select the SMA.  Selective SMA angiogram performed.  SMA angiogram:  SMA main ileocolic trunk is patent.  There are patent jejunal and colic branches.  There is enlargement and collateralization of the inferior pancreaticoduodenal arcade.  In this region there is a visualized enlarged vascular structure peripherally which correlates with the pancreaticoduodenal pseudoaneurysm.  There is retrograde filling of the gastroduodenal artery supplying the celiac vascular territory.  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  Attempts were made to access the celiac origin over a guide wire however this was unsuccessful.  SMA origin was reselected.  Magnification oblique angiograms were performed of the SMA.  Prominent inferior pancreaticoduodenal collaterals are noted.  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  Utilizing a Renegade micro catheter and a G T glidewire, selective catheterization was performed peripherally of the enlarged pancreaticoduodenal arcade.  Selective injections were performed of this branch.  However this was not the feeding vessel to the pseudoaneurysm.  This is the dominant collateral territory supplying the G D A which retrograde fills the celiac territory. Because of these findings, embolization was not performed of this branch.  Microcatheter and guide wire were utilized to select a second dominant branch to the pancreaticoduodenal arcade.  Selective injection was performed.  This demonstrates a small retroperitoneal branch which is not supplying the pseudoaneurysm.  Additional attempts were made to select other branches to the area however this was unsuccessful.  After additional catheter and guide wire  combinations, no other branches could be selected.  At this point, the procedure was stopped.  The microcatheter and base catheter were removed.  Hemostasis obtained with an Exoseal device. No immediate complication.  The patient tolerated the procedure well.  IMPRESSION: SMA angiogram demonstrates a prominent pancreaticoduodenal arcade supplying the G D A in a retrograde fashion and the entire celiac vasculature.  This is secondary to a celiac origin significant stenosis which was not able to be accessed with catheters and guide wires.  Inferior pancreaticoduodenal pseudoaneurysm demonstrated angiographically however micro catheterization could not be performed peripherally of the area to perform embolization.   Original Report Authenticated By: Judie Petit. Miles Costain, M.D.   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 Ruel Favors, M.D.  Medications:  Versed Fentanyl for conscious sedation  Guidance:  Ultrasound fluoroscopic  Fluoroscopy time:  35 minutes  Sedation time:  60 minutes  Contrast volume:  80 ml Omnipaque-300  Complications:  No immediate  PROCEDURE/FINDINGS:  Informed consent was obtained from the patient following explanation of the procedure, risks, benefits and alternatives. The patient understands, agrees and consents for the procedure. All questions were addressed.  A time out was performed.  Maximal barrier sterile technique utilized including caps, mask, sterile gowns, sterile gloves, large sterile drape, hand hygiene, and betadine  Under sterile conditions and local anesthesia, right common femoral artery micropuncture access was performed with ultrasound.  Images  obtained for documentation.  5-French sheath inserted over a Bentson guide  wire.  A Chung 2.5 reverse curve catheter was formed over the bifurcation.  This catheter was utilized to select the SMA.  Selective SMA angiogram performed.  SMA angiogram:  SMA main ileocolic trunk is patent.  There are patent jejunal and colic branches.  There is enlargement and collateralization of the inferior pancreaticoduodenal arcade.  In this region there is a visualized enlarged vascular structure peripherally which correlates with the pancreaticoduodenal pseudoaneurysm.  There is retrograde filling of the gastroduodenal artery supplying the celiac vascular territory.  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  Attempts were made to access the celiac origin over a guide wire however this was unsuccessful.  SMA origin was reselected.  Magnification oblique angiograms were performed of the SMA.  Prominent inferior pancreaticoduodenal collaterals are noted.  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  Utilizing a Renegade micro catheter and a G T glidewire, selective catheterization was performed peripherally of the enlarged pancreaticoduodenal arcade.  Selective injections were performed of this branch.  However this was not the feeding vessel to the pseudoaneurysm.  This is the dominant collateral territory supplying the G D A which retrograde fills the celiac territory. Because of these findings, embolization was not performed of this branch.  Microcatheter and guide wire were utilized to select a second dominant branch to the pancreaticoduodenal arcade.  Selective injection was performed.  This demonstrates a small retroperitoneal branch which is not supplying the pseudoaneurysm.  Additional attempts were made to select other branches to the area however this was unsuccessful.  After additional catheter and guide wire combinations, no other branches could be selected.  At this point, the procedure was  stopped.  The microcatheter and base catheter were removed.  Hemostasis obtained with an Exoseal device. No immediate complication.  The patient tolerated the procedure well.  IMPRESSION: SMA angiogram demonstrates a prominent pancreaticoduodenal arcade supplying the G D A in a retrograde fashion and the entire celiac vasculature.  This is secondary to a celiac origin significant stenosis which was not able to be accessed with catheters and guide wires.  Inferior pancreaticoduodenal pseudoaneurysm demonstrated angiographically however micro catheterization could not be performed peripherally of the area to perform embolization.   Original Report Authenticated By: Judie Petit. Miles Costain, M.D.   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 Ruel Favors, M.D.  Medications:  Versed Fentanyl for conscious sedation  Guidance:  Ultrasound fluoroscopic  Fluoroscopy time:  35 minutes  Sedation time:  60 minutes  Contrast volume:  80 ml Omnipaque-300  Complications:  No immediate  PROCEDURE/FINDINGS:  Informed consent was obtained from the patient following explanation of the procedure, risks, benefits and alternatives. The patient understands, agrees and consents for the procedure. All questions were addressed.  A time out was performed.  Maximal barrier sterile technique utilized including caps, mask, sterile gowns, sterile gloves, large sterile drape, hand hygiene, and betadine  Under sterile conditions and local anesthesia, right common femoral artery micropuncture access was performed with ultrasound.  Images obtained for documentation.  5-French sheath inserted over a Bentson guide wire.  A Chung 2.5 reverse  curve catheter was formed over the bifurcation.  This catheter was utilized to select the SMA.  Selective SMA angiogram performed.  SMA angiogram:  SMA main  ileocolic trunk is patent.  There are patent jejunal and colic branches.  There is enlargement and collateralization of the inferior pancreaticoduodenal arcade.  In this region there is a visualized enlarged vascular structure peripherally which correlates with the pancreaticoduodenal pseudoaneurysm.  There is retrograde filling of the gastroduodenal artery supplying the celiac vascular territory.  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  Attempts were made to access the celiac origin over a guide wire however this was unsuccessful.  SMA origin was reselected.  Magnification oblique angiograms were performed of the SMA.  Prominent inferior pancreaticoduodenal collaterals are noted.  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  Utilizing a Renegade micro catheter and a G T glidewire, selective catheterization was performed peripherally of the enlarged pancreaticoduodenal arcade.  Selective injections were performed of this branch.  However this was not the feeding vessel to the pseudoaneurysm.  This is the dominant collateral territory supplying the G D A which retrograde fills the celiac territory. Because of these findings, embolization was not performed of this branch.  Microcatheter and guide wire were utilized to select a second dominant branch to the pancreaticoduodenal arcade.  Selective injection was performed.  This demonstrates a small retroperitoneal branch which is not supplying the pseudoaneurysm.  Additional attempts were made to select other branches to the area however this was unsuccessful.  After additional catheter and guide wire combinations, no other branches could be selected.  At this point, the procedure was stopped.  The microcatheter and base catheter were removed.  Hemostasis obtained with an Exoseal  device. No immediate complication.  The patient tolerated the procedure well.  IMPRESSION: SMA angiogram demonstrates a prominent pancreaticoduodenal arcade supplying the G D A in a retrograde fashion and the entire celiac vasculature.  This is secondary to a celiac origin significant stenosis which was not able to be accessed with catheters and guide wires.  Inferior pancreaticoduodenal pseudoaneurysm demonstrated angiographically however micro catheterization could not be performed peripherally of the area to perform embolization.   Original Report Authenticated By: Judie Petit. Miles Costain, M.D.   Dg Chest Port 1 View  08/06/2012   *RADIOLOGY REPORT*  Clinical Data: Dyspnea.  No chest pain, cough or congestion.  PORTABLE CHEST - 1 VIEW  Comparison: 08/04/2012  Findings: Right IJ central venous catheter is unchanged.  Lungs are hypoinflated with mild bibasilar opacification likely a small amount of bilateral pleural fluid with atelectasis, although cannot exclude infection in the lung bases.  Remainder of the exam is unchanged.  IMPRESSION: Bibasilar opacification likely small effusions with associated atelectasis, although cannot exclude infection in the lung bases.  Right IJ central venous catheter unchanged.   Original Report Authenticated By: Elberta Fortis, M.D.   Dg Chest Port 1 View  08/04/2012   *RADIOLOGY REPORT*  Clinical Data: Central line placement  PORTABLE CHEST - 1 VIEW  Comparison: None.  Findings: Cardiomediastinal silhouette is unremarkable.  There is right IJ central line with tip in distal SVC.  No diagnostic pneumothorax.  No acute infiltrate or pulmonary edema.  IMPRESSION: Right IJ central line in place.  No diagnostic pneumothorax.   Original Report Authenticated By: Natasha Mead, M.D.   Ct Angio Abd/pel W/ And/or W/o  08/04/2012   *RADIOLOGY REPORT*  Clinical Data:  Abdominal bleeding on prior noncontrast CT. Evaluate for source of bleed.  CT ANGIOGRAPHY ABDOMEN AND PELVIS  Technique:  Multidetector  CT imaging of the abdomen and pelvis was performed using the standard protocol during bolus administration of intravenous contrast.  Multiplanar  reconstructed images including MIPs were obtained and reviewed to evaluate the vascular anatomy.  Contrast: OMNIPAQUE IOHEXOL 350 MG/ML SOLN  Comparison:  Noncontrast CT earlier today.  Findings:  There is a large area of hemorrhage seen in the right abdomen.  This measures approximately 22 x 12 cm compared with 16.5 x 10 cm previously.  Following contrast administration, there is evidence of a small pseudoaneurysm likely from a pancreaticoduodenal branch in the region of the posterior pancreatic head (image 93 of series 4 and image 34 of series 603). This may be related to prior bouts of pancreatitis.  Liver, gallbladder, spleen, pancreas, adrenals and kidneys demonstrate no focal abnormality.  There is a small amount of free fluid around the liver and spleen.  Stranding noted around the pancreas which could be related to pancreatitis or blood.  There is fluid in the transverse mesocolon also could be from pancreatitis or blood.  Moderate free fluid in the pelvis.  Large and small bowel are grossly unremarkable except for scattered colonic diverticula.  Small bowel is decompressed.  Aorta is normal caliber.  Scattered aortic calcifications.  No dissection.  No acute bony abnormality.  Degenerative changes in the lumbar spine.   Review of the MIP images confirms the above findings.  IMPRESSION: Large hematoma within the abdomen measuring up to 22 cm, increased from 16 cm previously.  The source of the bleeding appears to be a pseudoaneurysm likely arising from a pancreaticoduodenal branch within the posterior pancreatic head.  This may be related to prior bouts of pancreatitis.  Moderate free fluid in the abdomen or pelvis.  Stranding around the pancreas and in the transverse mesocolon, question blood versus changes related to pancreatitis.  Recommend clinical  correlation for elevated pancreatic enzymes.   Original Report Authenticated By: Charlett Nose, M.D.    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 We will continue to follow closely.  Dr. Erin Hearing team to see regarding need for further anticoagulaiton after D/C and regarding ?SVT run this AM - I spoke to [employer], [facility] ABL anemia and hemorrhagic shock - as above AKI - due to ATN I suspect and will improve Mobilize gently I spoke to the patient at length regarding the plan of care and then I met with his wife and two daughters to review the plan and answer their questions Time 32 min  LOS: 2 days    Evgenia Merriman E 08/06/2012

## 2012-08-06 NOTE — Progress Notes (Signed)
PULMONARY  / CRITICAL CARE MEDICINE  Name: Sean TURRELL MRN: [id number] DOB: [birth year]    ADMISSION DATE:  08/04/2012 CONSULTATION DATE:  08/04/12  REFERRING MD :  EDP PRIMARY SERVICE: PCCM  CHIEF COMPLAINT:  Abdominal Pain  BRIEF PATIENT DESCRIPTION: 72 year old male with PMH of atrial flutter on xeralto who [facility] after a day of abdominal pain, N/V and fever.  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  Patient was transferred to [facility] for vascular surgery to evaluate.  In the ED the patient was noted to remain bradycardic and BP was very fluctuant.  Patient was in severe pain upon evaluation and history was obtained from family.  SIGNIFICANT EVENTS / STUDIES:  5/13>>>Intrabdominal bleeding. 5/13 CT-angio abd >> large hematoma, pseudoaneurysm of pancreaticoduodenal branch without active bleeding 5/14 >> Went for possible embolization, unable to access the culprit vessel   LINES / TUBES: R IJ Cordis 5/13>>>  CULTURES: None  ANTIBIOTICS: None  SUBJECTIVE:  Went for possible embolization, unable to access the culprit vessel  Nausea and confusion overnight  VITAL SIGNS: Temp:  [97.7 F (36.5 C)-99 F (37.2 C)] 97.9 F (36.6 C) (05/15 0722) Pulse Rate:  [89-107] 94 (05/15 0800) Resp:  [11-27] 18 (05/15 0800) BP: (95-162)/(60-99) 137/77 mmHg (05/15 0800) SpO2:  [89 %-98 %] 92 % (05/15 0800) Weight:  [111.222 kg (245 lb 3.2 oz)] 111.222 kg (245 lb 3.2 oz) (05/15 0500) HEMODYNAMICS: CVP:  [6 mmHg-13 mmHg] 6 mmHg VENTILATOR SETTINGS:   INTAKE / OUTPUT: Intake/Output     05/14 0701 - 05/15 0700 05/15 0701 - 05/16 0700   I.V. (mL/kg) 1700 (15.3) 100 (0.9)   Blood 750    IV Piggyback 50    Total Intake(mL/kg) 2500 (22.5) 100 (0.9)   Urine (mL/kg/hr) 1095 (0.4) 10 (0.1)   Total Output 1095 10   Net +1405 +90          PHYSICAL EXAMINATION: General:  Well appearing male, no distress Neuro:  Alert and interactive, moving all  ext to command. HEENT:  [place]/AT, PERRL, EOM-I and -thyromegally/LAN. Cardiovascular:  RRR, Nl S1/S2, -M/R/G. Lungs:  CTA bilaterally. Abdomen:  Soft, NT, ND and +BS. Musculoskeletal:  -edema and -tenderness. Skin:  Cold and clammy.  LABS:  Recent Labs Lab 08/04/12 1440  08/04/12 1519 08/04/12 1541 08/04/12 1701 08/04/12 1702 08/04/12 1720  08/05/12 0004 08/05/12 0007  08/05/12 1000 08/05/12 1600 08/05/12 2310 08/06/12 0400  HGB 12.4*  --  10.6*  --  11.8*  --   --   < > 7.6*  --   < > 7.7* 8.5* 8.5* 8.4*  WBC 9.1  --  15.6*  --  14.6*  --   --   < > 10.5  --   < > 13.1* 14.9* 16.5* 17.7*  PLT 206  --  173  --  144*  --   --   < > 132*  --   < > 143* 142* 150 156  NA 137  --  137  --  137  --   --   --  138  --   --  140  --   --  141  K 3.6  --  3.6  --  4.2  --   --   --  4.2  --   --  4.6  --   --  4.7  CL 103  --  106  --  104  --   --   --  106  --   --  107  --   --  107  CO2 21  --  18*  --  20  --   --   --  23  --   --  25  --   --  23  GLUCOSE 151*  --  192*  --  248*  --   --   --  148*  --   --  150*  --   --  148*  BUN 22  --  22  --  22  --   --   --  28*  --   --  31*  --   --  46*  CREATININE 1.10  --  1.04  --  0.98  --   --   --  1.29  --   --  1.30  --   --  1.78*  CALCIUM 8.8  --  7.6*  --  7.2*  --   --   --  7.4*  --   --  8.0*  --   --  8.2*  MG  --   --   --   --  1.5  --   --   --   --   --   --  1.8  --   --  2.0  PHOS  --   --   --   --  4.1  --   --   --   --   --   --  3.4  --   --  4.3  AST 17  --  13  --  12  --   --   --   --   --   --   --   --   --   --   ALT 12  --  9  --  9  --   --   --   --   --   --   --   --   --   --   ALKPHOS 58  --  44  --  41  --   --   --   --   --   --   --   --   --   --   BILITOT 1.8*  --  1.6*  --  3.0*  --   --   --   --   --   --   --   --   --   --   PROT 6.2  --  4.6*  --  4.1*  --   --   --   --   --   --   --   --   --   --   ALBUMIN 3.5  --  2.4*  --  2.3*  --   --   --   --   --   --   --   --   --   --    APTT  --   < > 35  --  30  --   --   --  33  --   --  31  --   --   --   INR  --   < > 1.78*  --  1.09  --   --   --  1.33  --   --  1.40  --   --  1.20  LATICACIDVEN  --   --   --  4.34*  --  7.1*  --   --   --  2.5*  --   --   --   --   --   PROBNP  --   --   --   --   --  235.1*  --   --   --   --   --   --   --   --   --   PHART  --   --   --   --   --   --  7.295*  --   --   --   --   --   --   --   --   PCO2ART  --   --   --   --   --   --  30.8*  --   --   --   --   --   --   --   --   PO2ART  --   --   --   --   --   --  78.0*  --   --   --   --   --   --   --   --   < > = values in this interval not displayed. No results found for this basename: GLUCAP,  in the last 168 hours  CXR:   ASSESSMENT / PLAN:  PULMONARY A: Resp distress better with pain medications, concern is going to be balancing pain medications and respiratory drive. P:   - Titrate O2 as needed. - IS and flutter valve. - cxr 5/15  CARDIOVASCULAR A: HTN and a-fib history s/p cardioversion.  Hemorrhagic shock from intraabdominal bleed, stabilized P:  - coreg reversed, glucagon off 5/13 - Volume resuscitation. - See H/O section. - Dr Myra Gianotti following, ? Whether any other intervention needs to be made since embolization was not possible  RENAL A:  Acute renal insufficiency, progressive, oliguric Lactic acidosis, resolved P:   - Monitor I/O closely. - Monitor renal function and intraabdominal pressure.  - restart his uroxatral  GASTROINTESTINAL A:  Intraabdominal hemorrhage.   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 P:   - Pain control.  HEMATOLOGIC A:  Hemorrhagic shock, coagulopathic and massive transfusion. P:  - follow CBC and INR  INFECTIOUS A:  No active issues. P:   - Monitor fever curve and WBC.  ENDOCRINE A:  No history of diabetes.   P:   - follow CBG   NEUROLOGIC A:  No active issues. P:   - Monitor neuro status with narcotics.  OPTHO: P: - xalatan for his glaucoma  I  have personally obtained a history, examined the patient, evaluated laboratory and imaging results, formulated the assessment and plan and placed orders.   Levy Pupa, MD, PhD 08/06/2012, 8:42 AM [facility] [phone] or if no answer [phone]

## 2012-08-06 NOTE — Consult Note (Signed)
Reason for Consult: PAF (flutter)  Requesting Physician: Dr Janee Morn  HPI: This is a 72 y.o. male with a past medical history significant for MV disease. He had minimally invasive MV repair at [facility] by Dr Silvestre Mesi in 2002. He had a cath in 2006 after a false positive Nuclear study revealing normal coronaries. He has moderate PVD by doppler. He presented with atrial flutter in Nov 2013. He had a TEE/CV and had been holding NSR. His EF at TEE was 45-50%. He was put on Xarelto (CHADS2VASC = 2). 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 IR attempted embolization but this was not successful. The pt's HGb is stable. On telemetry he is having ectopy and he had a run of 23 beats of NSWCT at close to 180 (presumably atrial flutter). He is currently resting comfortably.   PMHx:  Past Medical History  Diagnosis Date  . Atrial flutter   . Hypertension   . Hypercholesteremia   . Mitral valve disease     annuloplasty 2002 [facility]  . Carotid stenosis     03/10/2007 right & left  bulbs & ICAs 0-49% reduction  ,right subclavian 50% reduction   Past Surgical History  Procedure Laterality Date  . Mitral valve repair  2002    [facility]  . Tee without cardioversion  02/12/2012    Procedure: TRANSESOPHAGEAL ECHOCARDIOGRAM (TEE);  Surgeon: [name], MD;  Location: [facility] ENDOSCOPY;  Service: Cardiovascular;  Laterality: N/A;  . Cardioversion  02/12/2012    Procedure: CARDIOVERSION;  Surgeon: [name], MD;  Location: [facility] ENDOSCOPY;  Service: Cardiovascular;  Laterality: N/A;  . Right heart cath  06/19/2004    normal right heart dynamics. EF 50%  . Nm myoview ltd  07/22/2006    no ischemia    FAMHx: History reviewed. No pertinent family history.  SOCHx:  reports that he has never smoked. He does not have any smokeless tobacco history on file. He reports that  drinks alcohol. He reports that he does not use illicit  drugs.  ALLERGIES: Allergies  Allergen Reactions  . Crestor (Rosuvastatin) Other (See Comments)    Muscle/chest pain  . Contrast Media (Iodinated Diagnostic Agents) Other (See Comments)    UNKNOWN REACTION PER PT  . Penicillins Rash    ROS: Pertinent items are noted in HPI.  HOME MEDICATIONS: Prescriptions prior to admission  Medication Sig Dispense Refill  . alfuzosin (UROXATRAL) 10 MG 24 hr tablet Take 10 mg by mouth daily.      . carvedilol (COREG) 6.25 MG tablet Take 9.375 mg by mouth 2 (two) times daily. Takes 1 1/2 tabs=9.375MG       . Glucosamine-Chondroitin (COSAMIN DS PO) Take 1 tablet by mouth 2 (two) times daily.      Marland Kitchen latanoprost (XALATAN) 0.005 % ophthalmic solution Place 1 drop into both eyes at bedtime.      . minocycline (MINOCIN,DYNACIN) 50 MG capsule Take 50 mg by mouth 2 (two) times daily.      Marland Kitchen neomycin-bacitracin-polymyxin (POLYSPORIN) ophthalmic ointment Place 1 application into the left eye 2 (two) times daily.      . Rivaroxaban (XARELTO) 20 MG TABS Take 20 mg by mouth at bedtime.         HOSPITAL MEDICATIONS: I have reviewed the patient's current medications.  VITALS: Blood pressure 146/61, pulse 86, temperature 97.5 F (36.4 C), temperature source Oral, resp. rate 24, weight 245 lb 3.2 oz (111.222 kg), SpO2 97.00%.  PHYSICAL EXAM: General appearance: alert, cooperative and no distress Neck: soft Lt carotid bruit Lungs: clear to auscultation bilaterally Heart: regular rate and rhythm and 2/6 systolic murmur AOv area Abdomen: positive bowel sounds Extremities: no edema Pulses: 2+ and symmetric Skin: cool and dry Neurologic: Grossly normal  LABS: Results for orders placed during the hospital encounter of 08/04/12 (from the past 48 hour(s))  PREPARE RBC (CROSSMATCH)     Status: None   Collection Time    08/04/12  2:22 PM      Result Value Range   Order Confirmation ORDER PROCESSED BY BLOOD BANK    TYPE AND SCREEN     Status: None    Collection Time    08/04/12  2:29 PM      Result Value Range   ABO/RH(D) A POS     Antibody Screen NEG     Sample Expiration 08/07/2012     Unit Number [id number]     Blood Component Type RED CELLS,LR     Unit division 00     Status of Unit ISSUED,FINAL     Unit tag comment VERBAL ORDERS PER DR DELOS     Transfusion Status OK TO TRANSFUSE     Crossmatch Result COMPATIBLE     Unit Number [id number]     Blood Component Type RED CELLS,LR     Unit division 00     Status of Unit ISSUED,FINAL     Unit tag comment VERBAL ORDERS PER DR DELOS     Transfusion Status OK TO TRANSFUSE     Crossmatch Result COMPATIBLE    COMPREHENSIVE METABOLIC PANEL     Status: Abnormal   Collection Time    08/04/12  2:40 PM      Result Value Range   Sodium 137  135 - 145 mEq/L   Potassium 3.6  3.5 - 5.1 mEq/L   Chloride 103  96 - 112 mEq/L   CO2 21  19 - 32 mEq/L   Glucose, Bld 151 (*) 70 - 99 mg/dL   BUN 22  6 - 23 mg/dL   Creatinine, Ser 3.08  0.50 - 1.35 mg/dL   Calcium 8.8  8.4 - 65.7 mg/dL   Total Protein 6.2  6.0 - 8.3 g/dL   Albumin 3.5  3.5 - 5.2 g/dL   AST 17  0 - 37 U/L   ALT 12  0 - 53 U/L   Alkaline Phosphatase 58  39 - 117 U/L   Total Bilirubin 1.8 (*) 0.3 - 1.2 mg/dL   GFR calc non Af Amer 66 (*) >90 mL/min   GFR calc Af Amer 76 (*) >90 mL/min   Comment:            The eGFR has been calculated     using the CKD EPI equation.     This calculation has not been     validated in all clinical     situations.     eGFR's persistently     <90 mL/min signify     possible Chronic Kidney Disease.  CBC WITH DIFFERENTIAL     Status: Abnormal   Collection Time    08/04/12  2:40 PM      Result Value Range   WBC 9.1  4.0 - 10.5 K/uL   RBC 4.12 (*) 4.22 - 5.81 MIL/uL   Hemoglobin 12.4 (*) 13.0 - 17.0 g/dL   HCT 84.6 (*) 96.2 - 95.2 %   MCV 88.1  78.0 - 100.0 fL  MCH 30.1  26.0 - 34.0 pg   MCHC 34.2  30.0 - 36.0 g/dL   RDW 16.1  09.6 - 04.5 %   Platelets 206  150 - 400 K/uL    Neutrophils Relative % 82 (*) 43 - 77 %   Neutro Abs 7.4  1.7 - 7.7 K/uL   Lymphocytes Relative 10 (*) 12 - 46 %   Lymphs Abs 0.9  0.7 - 4.0 K/uL   Monocytes Relative 7  3 - 12 %   Monocytes Absolute 0.7  0.1 - 1.0 K/uL   Eosinophils Relative 1  0 - 5 %   Eosinophils Absolute 0.1  0.0 - 0.7 K/uL   Basophils Relative 0  0 - 1 %   Basophils Absolute 0.0  0.0 - 0.1 K/uL  PREPARE RBC (CROSSMATCH)     Status: None   Collection Time    08/04/12  3:16 PM      Result Value Range   Order Confirmation ORDER PROCESSED BY BLOOD BANK    CBC WITH DIFFERENTIAL     Status: Abnormal   Collection Time    08/04/12  3:19 PM      Result Value Range   WBC 15.6 (*) 4.0 - 10.5 K/uL   RBC 3.65 (*) 4.22 - 5.81 MIL/uL   Hemoglobin 10.6 (*) 13.0 - 17.0 g/dL   HCT 40.9 (*) 81.1 - 91.4 %   MCV 87.7  78.0 - 100.0 fL   MCH 29.0  26.0 - 34.0 pg   MCHC 33.1  30.0 - 36.0 g/dL   RDW 78.2  95.6 - 21.3 %   Platelets 173  150 - 400 K/uL   Neutrophils Relative % 87 (*) 43 - 77 %   Lymphocytes Relative 6 (*) 12 - 46 %   Monocytes Relative 7  3 - 12 %   Eosinophils Relative 0  0 - 5 %   Basophils Relative 0  0 - 1 %   Neutro Abs 13.6 (*) 1.7 - 7.7 K/uL   Lymphs Abs 0.9  0.7 - 4.0 K/uL   Monocytes Absolute 1.1 (*) 0.1 - 1.0 K/uL   Eosinophils Absolute 0.0  0.0 - 0.7 K/uL   Basophils Absolute 0.0  0.0 - 0.1 K/uL   WBC Morphology FEW NEUTROPHIL BANDS NOTED    COMPREHENSIVE METABOLIC PANEL     Status: Abnormal   Collection Time    08/04/12  3:19 PM      Result Value Range   Sodium 137  135 - 145 mEq/L   Potassium 3.6  3.5 - 5.1 mEq/L   Chloride 106  96 - 112 mEq/L   CO2 18 (*) 19 - 32 mEq/L   Glucose, Bld 192 (*) 70 - 99 mg/dL   BUN 22  6 - 23 mg/dL   Creatinine, Ser 0.86  0.50 - 1.35 mg/dL   Calcium 7.6 (*) 8.4 - 10.5 mg/dL   Total Protein 4.6 (*) 6.0 - 8.3 g/dL   Albumin 2.4 (*) 3.5 - 5.2 g/dL   AST 13  0 - 37 U/L   ALT 9  0 - 53 U/L   Alkaline Phosphatase 44  39 - 117 U/L   Total Bilirubin 1.6 (*) 0.3 -  1.2 mg/dL   GFR calc non Af Amer 70 (*) >90 mL/min   GFR calc Af Amer 81 (*) >90 mL/min   Comment:            The eGFR has been calculated  using the CKD EPI equation.     This calculation has not been     validated in all clinical     situations.     eGFR's persistently     <90 mL/min signify     possible Chronic Kidney Disease.  APTT     Status: None   Collection Time    08/04/12  3:19 PM      Result Value Range   aPTT 35  24 - 37 seconds  PROTIME-INR     Status: Abnormal   Collection Time    08/04/12  3:19 PM      Result Value Range   Prothrombin Time 20.1 (*) 11.6 - 15.2 seconds   INR 1.78 (*) 0.00 - 1.49  TYPE AND SCREEN     Status: None   Collection Time    08/04/12  3:30 PM      Result Value Range   ABO/RH(D) A POS     Antibody Screen NEG     Sample Expiration 08/07/2012     Unit Number [id number]     Blood Component Type RBC LR PHER2     Unit division 00     Status of Unit ISSUED,FINAL     Unit tag comment VERBAL ORDERS PER DR YAO     Transfusion Status OK TO TRANSFUSE     Crossmatch Result COMPATIBLE     Unit Number [id number]     Blood Component Type RBC LR PHER2     Unit division 00     Status of Unit ISSUED,FINAL     Unit tag comment VERBAL ORDERS PER DR YAO     Transfusion Status OK TO TRANSFUSE     Crossmatch Result COMPATIBLE     Unit Number [id number]     Blood Component Type RED CELLS,LR     Unit division 00     Status of Unit ISSUED,FINAL     Unit tag comment VERBAL ORDERS PER DR YAO     Transfusion Status OK TO TRANSFUSE     Crossmatch Result COMPATIBLE     Unit Number [id number]     Blood Component Type RBC LR PHER1     Unit division 00     Status of Unit ISSUED,FINAL     Unit tag comment VERBAL ORDERS PER DR YAO     Transfusion Status OK TO TRANSFUSE     Crossmatch Result COMPATIBLE     Unit Number [id number]     Blood Component Type RED CELLS,LR     Unit division 00     Status of Unit REL FROM [facility]     Unit tag  comment VERBAL ORDERS PER DR YAO     Transfusion Status OK TO TRANSFUSE     Crossmatch Result NOT NEEDED     Unit Number [id number]     Blood Component Type RBC LR PHER1     Unit division 00     Status of Unit REL FROM [facility]     Unit tag comment VERBAL ORDERS PER DR YAO     Transfusion Status OK TO TRANSFUSE     Crossmatch Result NOT NEEDED     Unit Number [id number]     Blood Component Type RED CELLS,LR     Unit division 00     Status of Unit ISSUED,FINAL     Transfusion Status OK TO TRANSFUSE     Crossmatch Result COMPATIBLE     Unit Number [id number]  Blood Component Type RED CELLS,LR     Unit division 00     Status of Unit ISSUED,FINAL     Transfusion Status OK TO TRANSFUSE     Crossmatch Result COMPATIBLE     Unit Number [id number]     Blood Component Type RED CELLS,LR     Unit division 00     Status of Unit ALLOCATED     Transfusion Status OK TO TRANSFUSE     Crossmatch Result COMPATIBLE     Unit Number [id number]     Blood Component Type RED CELLS,LR     Unit division 00     Status of Unit ALLOCATED     Transfusion Status OK TO TRANSFUSE     Crossmatch Result COMPATIBLE     Unit Number [id number]     Blood Component Type RED CELLS,LR     Unit division 00     Status of Unit ALLOCATED     Transfusion Status OK TO TRANSFUSE     Crossmatch Result COMPATIBLE     Unit Number [id number]     Blood Component Type RED CELLS,LR     Unit division 00     Status of Unit ALLOCATED     Transfusion Status OK TO TRANSFUSE     Crossmatch Result COMPATIBLE     Unit Number [id number]     Blood Component Type RED CELLS,LR     Unit division 00     Status of Unit ALLOCATED     Transfusion Status OK TO TRANSFUSE     Crossmatch Result Compatible     Unit Number [id number]     Blood Component Type RED CELLS,LR     Unit division 00     Status of Unit ALLOCATED     Transfusion Status OK TO TRANSFUSE     Crossmatch Result Compatible    ABO/RH      Status: None   Collection Time    08/04/12  3:30 PM      Result Value Range   ABO/RH(D) A POS    POCT I-STAT TROPONIN I     Status: None   Collection Time    08/04/12  3:39 PM      Result Value Range   Troponin i, poc 0.03  0.00 - 0.08 ng/mL   Comment 3            Comment: Due to the release kinetics of cTnI,     a negative result within the first hours     of the onset of symptoms does not rule out     myocardial infarction with certainty.     If myocardial infarction is still suspected,     repeat the test at appropriate intervals.  CG4 I-STAT (LACTIC ACID)     Status: Abnormal   Collection Time    08/04/12  3:41 PM      Result Value Range   Lactic Acid, Venous 4.34 (*) 0.5 - 2.2 mmol/L  URINALYSIS, ROUTINE W REFLEX MICROSCOPIC     Status: Abnormal   Collection Time    08/04/12  3:53 PM      Result Value Range   Color, Urine AMBER (*) YELLOW   Comment: BIOCHEMICALS MAY BE AFFECTED BY COLOR   APPearance CLEAR  CLEAR   Specific Gravity, Urine 1.025  1.005 - 1.030   pH 6.0  5.0 - 8.0   Glucose, UA NEGATIVE  NEGATIVE mg/dL   Hgb urine dipstick SMALL (*) NEGATIVE  Bilirubin Urine NEGATIVE  NEGATIVE   Ketones, ur 40 (*) NEGATIVE mg/dL   Protein, ur 161 (*) NEGATIVE mg/dL   Urobilinogen, UA 0.2  0.0 - 1.0 mg/dL   Nitrite NEGATIVE  NEGATIVE   Leukocytes, UA NEGATIVE  NEGATIVE  URINE MICROSCOPIC-ADD ON     Status: Abnormal   Collection Time    08/04/12  3:53 PM      Result Value Range   Squamous Epithelial / LPF RARE  RARE   WBC, UA 0-2  <3 WBC/hpf   RBC / HPF 3-6  <3 RBC/hpf   Bacteria, UA RARE  RARE   Casts HYALINE CASTS (*) NEGATIVE   Urine-Other AMORPHOUS URATES/PHOSPHATES     Comment: MUCOUS PRESENT  PREPARE PLATELET PHERESIS     Status: None   Collection Time    08/04/12  4:24 PM      Result Value Range   Unit Number [id number]     Blood Component Type PLTPHER LR1     Unit division 00     Status of Unit ISSUED,FINAL     Transfusion Status OK TO TRANSFUSE      Unit Number [id number]     Blood Component Type PLTPHER LR1     Unit division 00     Status of Unit ISSUED,FINAL     Transfusion Status OK TO TRANSFUSE    PREPARE FRESH FROZEN PLASMA     Status: None   Collection Time    08/04/12  4:27 PM      Result Value Range   Unit Number [id number]     Blood Component Type THAWED PLASMA     Unit division 00     Status of Unit ISSUED,FINAL     Transfusion Status OK TO TRANSFUSE     Unit Number [id number]     Blood Component Type THAWED PLASMA     Unit division 00     Status of Unit ISSUED,FINAL     Transfusion Status OK TO TRANSFUSE     Unit Number [id number]     Blood Component Type THAWED PLASMA     Unit division 00     Status of Unit ISSUED,FINAL     Transfusion Status OK TO TRANSFUSE     Unit Number [id number]     Blood Component Type THAWED PLASMA     Unit division 00     Status of Unit ISSUED,FINAL     Transfusion Status OK TO TRANSFUSE    CBC WITH DIFFERENTIAL     Status: Abnormal   Collection Time    08/04/12  5:01 PM      Result Value Range   WBC 14.6 (*) 4.0 - 10.5 K/uL   RBC 3.89 (*) 4.22 - 5.81 MIL/uL   Hemoglobin 11.8 (*) 13.0 - 17.0 g/dL   HCT 18.8 (*) 41.6 - 60.6 %   MCV 86.9  78.0 - 100.0 fL   MCH 30.3  26.0 - 34.0 pg   MCHC 34.9  30.0 - 36.0 g/dL   RDW 30.1  60.1 - 09.3 %   Platelets 144 (*) 150 - 400 K/uL   Neutrophils Relative % 86 (*) 43 - 77 %   Neutro Abs 12.6 (*) 1.7 - 7.7 K/uL   Lymphocytes Relative 7 (*) 12 - 46 %   Lymphs Abs 1.0  0.7 - 4.0 K/uL   Monocytes Relative 7  3 - 12 %   Monocytes Absolute 1.0  0.1 - 1.0 K/uL   Eosinophils Relative  0  0 - 5 %   Eosinophils Absolute 0.0  0.0 - 0.7 K/uL   Basophils Relative 0  0 - 1 %   Basophils Absolute 0.0  0.0 - 0.1 K/uL  COMPREHENSIVE METABOLIC PANEL     Status: Abnormal   Collection Time    08/04/12  5:01 PM      Result Value Range   Sodium 137  135 - 145 mEq/L   Potassium 4.2  3.5 - 5.1 mEq/L   Chloride 104  96 - 112 mEq/L   CO2  20  19 - 32 mEq/L   Glucose, Bld 248 (*) 70 - 99 mg/dL   BUN 22  6 - 23 mg/dL   Creatinine, Ser 1.30  0.50 - 1.35 mg/dL   Calcium 7.2 (*) 8.4 - 10.5 mg/dL   Total Protein 4.1 (*) 6.0 - 8.3 g/dL   Albumin 2.3 (*) 3.5 - 5.2 g/dL   AST 12  0 - 37 U/L   ALT 9  0 - 53 U/L   Alkaline Phosphatase 41  39 - 117 U/L   Total Bilirubin 3.0 (*) 0.3 - 1.2 mg/dL   GFR calc non Af Amer 81 (*) >90 mL/min   GFR calc Af Amer >90  >90 mL/min   Comment:            The eGFR has been calculated     using the CKD EPI equation.     This calculation has not been     validated in all clinical     situations.     eGFR's persistently     <90 mL/min signify     possible Chronic Kidney Disease.  APTT     Status: None   Collection Time    08/04/12  5:01 PM      Result Value Range   aPTT 30  24 - 37 seconds  PROTIME-INR     Status: None   Collection Time    08/04/12  5:01 PM      Result Value Range   Prothrombin Time 14.0  11.6 - 15.2 seconds   INR 1.09  0.00 - 1.49  MAGNESIUM     Status: None   Collection Time    08/04/12  5:01 PM      Result Value Range   Magnesium 1.5  1.5 - 2.5 mg/dL  PHOSPHORUS     Status: None   Collection Time    08/04/12  5:01 PM      Result Value Range   Phosphorus 4.1  2.3 - 4.6 mg/dL  LACTIC ACID, PLASMA     Status: Abnormal   Collection Time    08/04/12  5:02 PM      Result Value Range   Lactic Acid, Venous 7.1 (*) 0.5 - 2.2 mmol/L  CORTISOL     Status: None   Collection Time    08/04/12  5:02 PM      Result Value Range   Cortisol, Plasma 46.9     Comment: (NOTE)     AM:  4.3 - 22.4 ug/dL     PM:  3.1 - 86.5 ug/dL  PRO B NATRIURETIC PEPTIDE     Status: Abnormal   Collection Time    08/04/12  5:02 PM      Result Value Range   Pro B Natriuretic peptide (BNP) 235.1 (*) 0 - 125 pg/mL  POCT I-STAT 3, BLOOD GAS (G3+)     Status: Abnormal   Collection Time  08/04/12  5:20 PM      Result Value Range   pH, Arterial 7.295 (*) 7.350 - 7.450   pCO2 arterial 30.8  (*) 35.0 - 45.0 mmHg   pO2, Arterial 78.0 (*) 80.0 - 100.0 mmHg   Bicarbonate 15.1 (*) 20.0 - 24.0 mEq/L   TCO2 16  0 - 100 mmol/L   O2 Saturation 95.0     Acid-base deficit 11.0 (*) 0.0 - 2.0 mmol/L   Patient temperature 36.0 C     Collection site RADIAL, ALLEN'S TEST ACCEPTABLE     Drawn by Operator     Sample type ARTERIAL    CBC     Status: Abnormal   Collection Time    08/04/12  7:34 PM      Result Value Range   WBC 11.8 (*) 4.0 - 10.5 K/uL   RBC 2.61 (*) 4.22 - 5.81 MIL/uL   Hemoglobin 7.8 (*) 13.0 - 17.0 g/dL   Comment: DELTA CHECK NOTED     REPEATED TO VERIFY   HCT 21.9 (*) 39.0 - 52.0 %   MCV 83.9  78.0 - 100.0 fL   MCH 29.9  26.0 - 34.0 pg   MCHC 35.6  30.0 - 36.0 g/dL   RDW 16.1  09.6 - 04.5 %   Platelets 138 (*) 150 - 400 K/uL  MRSA PCR SCREENING     Status: None   Collection Time    08/04/12  8:36 PM      Result Value Range   MRSA by PCR NEGATIVE  NEGATIVE   Comment:            The GeneXpert MRSA Assay (FDA     approved for NASAL specimens     only), is one component of a     comprehensive MRSA colonization     surveillance program. It is not     intended to diagnose MRSA     infection nor to guide or     monitor treatment for     MRSA infections.  AMYLASE     Status: None   Collection Time    08/05/12 12:04 AM      Result Value Range   Amylase 47  0 - 105 U/L  LIPASE, BLOOD     Status: None   Collection Time    08/05/12 12:04 AM      Result Value Range   Lipase 29  11 - 59 U/L  CBC     Status: Abnormal   Collection Time    08/05/12 12:04 AM      Result Value Range   WBC 10.5  4.0 - 10.5 K/uL   RBC 2.54 (*) 4.22 - 5.81 MIL/uL   Hemoglobin 7.6 (*) 13.0 - 17.0 g/dL   HCT 40.9 (*) 81.1 - 91.4 %   MCV 83.9  78.0 - 100.0 fL   MCH 29.9  26.0 - 34.0 pg   MCHC 35.7  30.0 - 36.0 g/dL   RDW 78.2  95.6 - 21.3 %   Platelets 132 (*) 150 - 400 K/uL  BASIC METABOLIC PANEL     Status: Abnormal   Collection Time    08/05/12 12:04 AM      Result Value  Range   Sodium 138  135 - 145 mEq/L   Potassium 4.2  3.5 - 5.1 mEq/L   Chloride 106  96 - 112 mEq/L   CO2 23  19 - 32 mEq/L   Glucose, Bld 148 (*) 70 -  99 mg/dL   BUN 28 (*) 6 - 23 mg/dL   Creatinine, Ser 1.61  0.50 - 1.35 mg/dL   Calcium 7.4 (*) 8.4 - 10.5 mg/dL   GFR calc non Af Amer 54 (*) >90 mL/min   GFR calc Af Amer 63 (*) >90 mL/min   Comment:            The eGFR has been calculated     using the CKD EPI equation.     This calculation has not been     validated in all clinical     situations.     eGFR's persistently     <90 mL/min signify     possible Chronic Kidney Disease.  PROTIME-INR     Status: Abnormal   Collection Time    08/05/12 12:04 AM      Result Value Range   Prothrombin Time 16.2 (*) 11.6 - 15.2 seconds   INR 1.33  0.00 - 1.49  APTT     Status: None   Collection Time    08/05/12 12:04 AM      Result Value Range   aPTT 33  24 - 37 seconds  LACTIC ACID, PLASMA     Status: Abnormal   Collection Time    08/05/12 12:07 AM      Result Value Range   Lactic Acid, Venous 2.5 (*) 0.5 - 2.2 mmol/L  PREPARE RBC (CROSSMATCH)     Status: None   Collection Time    08/05/12 12:30 AM      Result Value Range   Order Confirmation ORDER PROCESSED BY BLOOD BANK    CBC     Status: Abnormal   Collection Time    08/05/12  4:00 AM      Result Value Range   WBC 10.9 (*) 4.0 - 10.5 K/uL   RBC 2.59 (*) 4.22 - 5.81 MIL/uL   Hemoglobin 7.7 (*) 13.0 - 17.0 g/dL   HCT 09.6 (*) 04.5 - 40.9 %   MCV 84.2  78.0 - 100.0 fL   MCH 29.7  26.0 - 34.0 pg   MCHC 35.3  30.0 - 36.0 g/dL   RDW 81.1  91.4 - 78.2 %   Platelets 132 (*) 150 - 400 K/uL  CBC     Status: Abnormal   Collection Time    08/05/12 10:00 AM      Result Value Range   WBC 13.1 (*) 4.0 - 10.5 K/uL   RBC 2.62 (*) 4.22 - 5.81 MIL/uL   Hemoglobin 7.7 (*) 13.0 - 17.0 g/dL   HCT 95.6 (*) 21.3 - 08.6 %   MCV 84.0  78.0 - 100.0 fL   MCH 29.4  26.0 - 34.0 pg   MCHC 35.0  30.0 - 36.0 g/dL   RDW 57.8  46.9 - 62.9 %    Platelets 143 (*) 150 - 400 K/uL  BASIC METABOLIC PANEL     Status: Abnormal   Collection Time    08/05/12 10:00 AM      Result Value Range   Sodium 140  135 - 145 mEq/L   Potassium 4.6  3.5 - 5.1 mEq/L   Chloride 107  96 - 112 mEq/L   CO2 25  19 - 32 mEq/L   Glucose, Bld 150 (*) 70 - 99 mg/dL   BUN 31 (*) 6 - 23 mg/dL   Creatinine, Ser 5.28  0.50 - 1.35 mg/dL   Calcium 8.0 (*) 8.4 - 10.5 mg/dL   GFR  calc non Af Amer 54 (*) >90 mL/min   GFR calc Af Amer 62 (*) >90 mL/min   Comment:            The eGFR has been calculated     using the CKD EPI equation.     This calculation has not been     validated in all clinical     situations.     eGFR's persistently     <90 mL/min signify     possible Chronic Kidney Disease.  MAGNESIUM     Status: None   Collection Time    08/05/12 10:00 AM      Result Value Range   Magnesium 1.8  1.5 - 2.5 mg/dL  PHOSPHORUS     Status: None   Collection Time    08/05/12 10:00 AM      Result Value Range   Phosphorus 3.4  2.3 - 4.6 mg/dL  APTT     Status: None   Collection Time    08/05/12 10:00 AM      Result Value Range   aPTT 31  24 - 37 seconds  PROTIME-INR     Status: Abnormal   Collection Time    08/05/12 10:00 AM      Result Value Range   Prothrombin Time 16.8 (*) 11.6 - 15.2 seconds   INR 1.40  0.00 - 1.49  CBC     Status: Abnormal   Collection Time    08/05/12  4:00 PM      Result Value Range   WBC 14.9 (*) 4.0 - 10.5 K/uL   RBC 2.92 (*) 4.22 - 5.81 MIL/uL   Hemoglobin 8.5 (*) 13.0 - 17.0 g/dL   HCT 16.1 (*) 09.6 - 04.5 %   MCV 83.2  78.0 - 100.0 fL   MCH 29.1  26.0 - 34.0 pg   MCHC 35.0  30.0 - 36.0 g/dL   RDW 40.9  81.1 - 91.4 %   Platelets 142 (*) 150 - 400 K/uL  CBC     Status: Abnormal   Collection Time    08/05/12 11:10 PM      Result Value Range   WBC 16.5 (*) 4.0 - 10.5 K/uL   RBC 2.92 (*) 4.22 - 5.81 MIL/uL   Hemoglobin 8.5 (*) 13.0 - 17.0 g/dL   HCT 78.2 (*) 95.6 - 21.3 %   MCV 83.6  78.0 - 100.0 fL   MCH 29.1   26.0 - 34.0 pg   MCHC 34.8  30.0 - 36.0 g/dL   RDW 08.6  57.8 - 46.9 %   Platelets 150  150 - 400 K/uL  CBC     Status: Abnormal   Collection Time    08/06/12  4:00 AM      Result Value Range   WBC 17.7 (*) 4.0 - 10.5 K/uL   RBC 2.81 (*) 4.22 - 5.81 MIL/uL   Hemoglobin 8.4 (*) 13.0 - 17.0 g/dL   HCT 62.9 (*) 52.8 - 41.3 %   MCV 84.0  78.0 - 100.0 fL   MCH 29.9  26.0 - 34.0 pg   MCHC 35.6  30.0 - 36.0 g/dL   RDW 24.4  01.0 - 27.2 %   Platelets 156  150 - 400 K/uL  BASIC METABOLIC PANEL     Status: Abnormal   Collection Time    08/06/12  4:00 AM      Result Value Range   Sodium 141  135 - 145 mEq/L  Potassium 4.7  3.5 - 5.1 mEq/L   Chloride 107  96 - 112 mEq/L   CO2 23  19 - 32 mEq/L   Glucose, Bld 148 (*) 70 - 99 mg/dL   BUN 46 (*) 6 - 23 mg/dL   Creatinine, Ser 1.91 (*) 0.50 - 1.35 mg/dL   Calcium 8.2 (*) 8.4 - 10.5 mg/dL   GFR calc non Af Amer 37 (*) >90 mL/min   GFR calc Af Amer 43 (*) >90 mL/min   Comment:            The eGFR has been calculated     using the CKD EPI equation.     This calculation has not been     validated in all clinical     situations.     eGFR's persistently     <90 mL/min signify     possible Chronic Kidney Disease.  MAGNESIUM     Status: None   Collection Time    08/06/12  4:00 AM      Result Value Range   Magnesium 2.0  1.5 - 2.5 mg/dL  PHOSPHORUS     Status: None   Collection Time    08/06/12  4:00 AM      Result Value Range   Phosphorus 4.3  2.3 - 4.6 mg/dL  PROTIME-INR     Status: None   Collection Time    08/06/12  4:00 AM      Result Value Range   Prothrombin Time 15.0  11.6 - 15.2 seconds   INR 1.20  0.00 - 1.49    EKG:   IMAGING: Ct Abdomen Pelvis Wo Contrast  08/04/2012   *RADIOLOGY REPORT*  Clinical Data:  Abdominal pain, diaphoretic, hypotensive, evaluate for ruptured AAA  CT CHEST, ABDOMEN AND PELVIS WITHOUT CONTRAST  Technique:  Multidetector CT imaging of the chest, abdomen and pelvis was performed following the  standard protocol without IV contrast.  Comparison:   None.  CT CHEST  Findings:  Mild subpleural reticulation/fibrosis in the lungs bilaterally, possibly reflecting mild chronic interstitial lung disease.  No suspicious pulmonary nodules.  No pleural effusion or pneumothorax.  Visualized thyroid is unremarkable.  The heart is mildly enlarged.  Hypodense blood pole relative to myocardium, suggesting anemia.  No pericardial effusion. Atherosclerotic calcifications of the aortic arch.  Small mediastinal lymph nodes which do not meet pathologic CT size criteria.  No suspicious axillary lymphadenopathy.  Degenerative changes of the thoracic spine.  IMPRESSION: No evidence of acute cardiopulmonary disease.  CT ABDOMEN AND PELVIS  Findings:  Liver, spleen, and adrenal glands are within normal limits.  Pancreatic head/uncinate process is displaced anteriorly but otherwise unremarkable.  Gallbladder is unremarkable.  No intrahepatic or extrahepatic ductal dilatation.  Kidneys are unremarkable.  No hydronephrosis.  Large hematoma centered in the right upper abdomen measuring approximately 9.8 x 19.3 cm (series 3/image 81).  This is difficult to separate from the second and third portions of the duodenum as well as the distal celiac artery.  Hemorrhage extends inferiorly just anterior to the right common iliac artery (series 3/image 94) and into the right pelvis (series 3/image 103).  No evidence of abdominal aortic aneurysm.  A fat plane separates the collection from the abdominal aorta and the SMA.  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  IVC is narrowed/slit-like (series 3/image 84), possibly reflecting hypovolemia.  No evidence of bowel obstruction.  Prostatomegaly, measuring 5.8 cm in transverse dimension.  Bladder is within normal limits.  Small volume pelvic ascites.  Small fat-containing left inguinal hernia.  Degenerative changes of the lumbar spine.   IMPRESSION: No evidence of abdominal aortic aneurysm.  Large hematoma centered in the right upper abdomen, as described above, etiology unclear.  Emergent surgical consultation is suggested.  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  Additional ancillary findings as above.  These results were called by telephone on 08/04/2012 at 1415 hours to Dr Judd Lien, who verbally acknowledged these results.   Original Report Authenticated By: Charline Bills, M.D.   Ct Chest Wo Contrast  08/04/2012   *RADIOLOGY REPORT*  Clinical Data:  Abdominal pain, diaphoretic, hypotensive, evaluate for ruptured AAA  CT CHEST, ABDOMEN AND PELVIS WITHOUT CONTRAST  Technique:  Multidetector CT imaging of the chest, abdomen and pelvis was performed following the standard protocol without IV contrast.  Comparison:   None.  CT CHEST  Findings:  Mild subpleural reticulation/fibrosis in the lungs bilaterally, possibly reflecting mild chronic interstitial lung disease.  No suspicious pulmonary nodules.  No pleural effusion or pneumothorax.  Visualized thyroid is unremarkable.  The heart is mildly enlarged.  Hypodense blood pole relative to myocardium, suggesting anemia.  No pericardial effusion. Atherosclerotic calcifications of the aortic arch.  Small mediastinal lymph nodes which do not meet pathologic CT size criteria.  No suspicious axillary lymphadenopathy.  Degenerative changes of the thoracic spine.  IMPRESSION: No evidence of acute cardiopulmonary disease.  CT ABDOMEN AND PELVIS  Findings:  Liver, spleen, and adrenal glands are within normal limits.  Pancreatic head/uncinate process is displaced anteriorly but otherwise unremarkable.  Gallbladder is unremarkable.  No intrahepatic or extrahepatic ductal dilatation.  Kidneys are unremarkable.  No hydronephrosis.  Large hematoma centered in the right upper abdomen measuring approximately 9.8 x 19.3 cm (series 3/image 81).  This is difficult to separate from  the second and third portions of the duodenum as well as the distal celiac artery.  Hemorrhage extends inferiorly just anterior to the right common iliac artery (series 3/image 94) and into the right pelvis (series 3/image 103).  No evidence of abdominal aortic aneurysm.  A fat plane separates the collection from the abdominal aorta and the SMA.  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  IVC is narrowed/slit-like (series 3/image 84), possibly reflecting hypovolemia.  No evidence of bowel obstruction.  Prostatomegaly, measuring 5.8 cm in transverse dimension.  Bladder is within normal limits.  Small volume pelvic ascites.  Small fat-containing left inguinal hernia.  Degenerative changes of the lumbar spine.  IMPRESSION: No evidence of abdominal aortic aneurysm.  Large hematoma centered in the right upper abdomen, as described above, etiology unclear.  Emergent surgical consultation is suggested.  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  Additional ancillary findings as above.  These results were called by telephone on 08/04/2012 at 1415 hours to Dr Judd Lien, who verbally acknowledged these results.   Original Report Authenticated By: Charline Bills, M.D.   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 Ruel Favors, M.D.  Medications:  Versed Fentanyl for conscious sedation  Guidance:  Ultrasound fluoroscopic  Fluoroscopy time:  35 minutes  Sedation time:  60 minutes  Contrast  volume:  80 ml Omnipaque-300  Complications:  No immediate  PROCEDURE/FINDINGS:  Informed consent was obtained from the patient  following explanation of the procedure, risks, benefits and alternatives. The patient understands, agrees and consents for the procedure. All questions were addressed.  A time out was performed.  Maximal barrier sterile technique utilized including caps, mask, sterile gowns, sterile gloves, large sterile drape, hand hygiene, and betadine  Under sterile conditions and local anesthesia, right common femoral artery micropuncture access was performed with ultrasound.  Images obtained for documentation.  5-French sheath inserted over a Bentson guide wire.  A Chung 2.5 reverse curve catheter was formed over the bifurcation.  This catheter was utilized to select the SMA.  Selective SMA angiogram performed.  SMA angiogram:  SMA main ileocolic trunk is patent.  There are patent jejunal and colic branches.  There is enlargement and collateralization of the inferior pancreaticoduodenal arcade.  In this region there is a visualized enlarged vascular structure peripherally which correlates with the pancreaticoduodenal pseudoaneurysm.  There is retrograde filling of the gastroduodenal artery supplying the celiac vascular territory.  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  Attempts were made to access the celiac origin over a guide wire however this was unsuccessful.  SMA origin was reselected.  Magnification oblique angiograms were performed of the SMA.  Prominent inferior pancreaticoduodenal collaterals are noted.  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  Utilizing a Renegade micro catheter and a G T glidewire, selective catheterization was performed peripherally of the enlarged pancreaticoduodenal arcade.  Selective injections were performed of this branch.  However this was not the feeding vessel to the pseudoaneurysm.  This is the dominant collateral territory supplying the G D A which retrograde fills the celiac territory. Because of these findings, embolization was not performed of this  branch.  Microcatheter and guide wire were utilized to select a second dominant branch to the pancreaticoduodenal arcade.  Selective injection was performed.  This demonstrates a small retroperitoneal branch which is not supplying the pseudoaneurysm.  Additional attempts were made to select other branches to the area however this was unsuccessful.  After additional catheter and guide wire combinations, no other branches could be selected.  At this point, the procedure was stopped.  The microcatheter and base catheter were removed.  Hemostasis obtained with an Exoseal device. No immediate complication.  The patient tolerated the procedure well.  IMPRESSION: SMA angiogram demonstrates a prominent pancreaticoduodenal arcade supplying the G D A in a retrograde fashion and the entire celiac vasculature.  This is secondary to a celiac origin significant stenosis which was not able to be accessed with catheters and guide wires.  Inferior pancreaticoduodenal pseudoaneurysm demonstrated angiographically however micro catheterization could not be performed peripherally of the area to perform embolization.   Original Report Authenticated By: Judie Petit. Miles Costain, M.D.   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 Ruel Favors, M.D.  Medications:  Versed Fentanyl for conscious sedation  Guidance:  Ultrasound fluoroscopic  Fluoroscopy time:  35 minutes  Sedation time:  60 minutes  Contrast volume:  80 ml Omnipaque-300  Complications:  No immediate  PROCEDURE/FINDINGS:  Informed consent was obtained from the patient following explanation of the procedure, risks, benefits and alternatives. The patient  understands,  agrees and consents for the procedure. All questions were addressed.  A time out was performed.  Maximal barrier sterile technique utilized including caps, mask, sterile gowns, sterile gloves, large sterile drape, hand hygiene, and betadine  Under sterile conditions and local anesthesia, right common femoral artery micropuncture access was performed with ultrasound.  Images obtained for documentation.  5-French sheath inserted over a Bentson guide wire.  A Chung 2.5 reverse curve catheter was formed over the bifurcation.  This catheter was utilized to select the SMA.  Selective SMA angiogram performed.  SMA angiogram:  SMA main ileocolic trunk is patent.  There are patent jejunal and colic branches.  There is enlargement and collateralization of the inferior pancreaticoduodenal arcade.  In this region there is a visualized enlarged vascular structure peripherally which correlates with the pancreaticoduodenal pseudoaneurysm.  There is retrograde filling of the gastroduodenal artery supplying the celiac vascular territory.  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  Attempts were made to access the celiac origin over a guide wire however this was unsuccessful.  SMA origin was reselected.  Magnification oblique angiograms were performed of the SMA.  Prominent inferior pancreaticoduodenal collaterals are noted.  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  Utilizing a Renegade micro catheter and a G T glidewire, selective catheterization was performed peripherally of the enlarged pancreaticoduodenal arcade.  Selective injections were performed of this branch.  However this was not the feeding vessel to the pseudoaneurysm.  This is the dominant collateral territory supplying the G D A which retrograde fills the celiac territory. Because of these findings, embolization was not performed of this branch.  Microcatheter and guide wire were utilized to select a second dominant branch  to the pancreaticoduodenal arcade.  Selective injection was performed.  This demonstrates a small retroperitoneal branch which is not supplying the pseudoaneurysm.  Additional attempts were made to select other branches to the area however this was unsuccessful.  After additional catheter and guide wire combinations, no other branches could be selected.  At this point, the procedure was stopped.  The microcatheter and base catheter were removed.  Hemostasis obtained with an Exoseal device. No immediate complication.  The patient tolerated the procedure well.  IMPRESSION: SMA angiogram demonstrates a prominent pancreaticoduodenal arcade supplying the G D A in a retrograde fashion and the entire celiac vasculature.  This is secondary to a celiac origin significant stenosis which was not able to be accessed with catheters and guide wires.  Inferior pancreaticoduodenal pseudoaneurysm demonstrated angiographically however micro catheterization could not be performed peripherally of the area to perform embolization.   Original Report Authenticated By: Judie Petit. Miles Costain, M.D.   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 Ruel Favors, M.D.  Medications:  Versed Fentanyl for conscious sedation  Guidance:  Ultrasound fluoroscopic  Fluoroscopy time:  35 minutes  Sedation time:  60 minutes  Contrast volume:  80 ml Omnipaque-300  Complications:  No immediate  PROCEDURE/FINDINGS:  Informed consent was obtained from the patient following explanation of the procedure, risks, benefits and alternatives. The patient understands, agrees and consents for the procedure. All questions were addressed.  A time  out was performed.  Maximal barrier sterile technique utilized including caps, mask, sterile gowns, sterile gloves, large sterile drape, hand  hygiene, and betadine  Under sterile conditions and local anesthesia, right common femoral artery micropuncture access was performed with ultrasound.  Images obtained for documentation.  5-French sheath inserted over a Bentson guide wire.  A Chung 2.5 reverse curve catheter was formed over the bifurcation.  This catheter was utilized to select the SMA.  Selective SMA angiogram performed.  SMA angiogram:  SMA main ileocolic trunk is patent.  There are patent jejunal and colic branches.  There is enlargement and collateralization of the inferior pancreaticoduodenal arcade.  In this region there is a visualized enlarged vascular structure peripherally which correlates with the pancreaticoduodenal pseudoaneurysm.  There is retrograde filling of the gastroduodenal artery supplying the celiac vascular territory.  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  Attempts were made to access the celiac origin over a guide wire however this was unsuccessful.  SMA origin was reselected.  Magnification oblique angiograms were performed of the SMA.  Prominent inferior pancreaticoduodenal collaterals are noted.  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  Utilizing a Renegade micro catheter and a G T glidewire, selective catheterization was performed peripherally of the enlarged pancreaticoduodenal arcade.  Selective injections were performed of this branch.  However this was not the feeding vessel to the pseudoaneurysm.  This is the dominant collateral territory supplying the G D A which retrograde fills the celiac territory. Because of these findings, embolization was not performed of this branch.  Microcatheter and guide wire were utilized to select a second dominant branch to the pancreaticoduodenal arcade.  Selective injection was performed.  This demonstrates  a small retroperitoneal branch which is not supplying the pseudoaneurysm.  Additional attempts were made to select other branches to the area however this was unsuccessful.  After additional catheter and guide wire combinations, no other branches could be selected.  At this point, the procedure was stopped.  The microcatheter and base catheter were removed.  Hemostasis obtained with an Exoseal device. No immediate complication.  The patient tolerated the procedure well.  IMPRESSION: SMA angiogram demonstrates a prominent pancreaticoduodenal arcade supplying the G D A in a retrograde fashion and the entire celiac vasculature.  This is secondary to a celiac origin significant stenosis which was not able to be accessed with catheters and guide wires.  Inferior pancreaticoduodenal pseudoaneurysm demonstrated angiographically however micro catheterization could not be performed peripherally of the area to perform embolization.   Original Report Authenticated By: Judie Petit. Miles Costain, M.D.   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 Ruel Favors, M.D.  Medications:  Versed Fentanyl for conscious sedation  Guidance:  Ultrasound fluoroscopic  Fluoroscopy time:  35 minutes  Sedation time:  60 minutes  Contrast volume:  80 ml Omnipaque-300  Complications:  No immediate  PROCEDURE/FINDINGS:  Informed consent was obtained from the patient following explanation of the procedure, risks, benefits and alternatives. The patient understands, agrees and consents for the procedure. All questions were addressed.  A time out was performed.  Maximal barrier sterile technique utilized including caps, mask, sterile gowns,  sterile gloves, large sterile drape, hand hygiene, and betadine  Under sterile conditions and local anesthesia, right common femoral artery micropuncture access was performed with ultrasound.  Images obtained for documentation.  5-French sheath inserted over a Bentson guide  wire.  A Chung 2.5 reverse curve catheter was formed over the bifurcation.  This catheter was utilized to select the SMA.  Selective SMA angiogram performed.  SMA angiogram:  SMA main ileocolic trunk is patent.  There are patent jejunal and colic branches.  There is enlargement and collateralization of the inferior pancreaticoduodenal arcade.  In this region there is a visualized enlarged vascular structure peripherally which correlates with the pancreaticoduodenal pseudoaneurysm.  There is retrograde filling of the gastroduodenal artery supplying the celiac vascular territory.  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  Attempts were made to access the celiac origin over a guide wire however this was unsuccessful.  SMA origin was reselected.  Magnification oblique angiograms were performed of the SMA.  Prominent inferior pancreaticoduodenal collaterals are noted.  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  Utilizing a Renegade micro catheter and a G T glidewire, selective catheterization was performed peripherally of the enlarged pancreaticoduodenal arcade.  Selective injections were performed of this branch.  However this was not the feeding vessel to the pseudoaneurysm.  This is the dominant collateral territory supplying the G D A which retrograde fills the celiac territory. Because of these findings, embolization was not performed of this branch.  Microcatheter and guide wire were utilized to select a second dominant branch to the pancreaticoduodenal arcade.  Selective injection was performed.  This demonstrates a small retroperitoneal branch which is not supplying the pseudoaneurysm.  Additional attempts were  made to select other branches to the area however this was unsuccessful.  After additional catheter and guide wire combinations, no other branches could be selected.  At this point, the procedure was stopped.  The microcatheter and base catheter were removed.  Hemostasis obtained with an Exoseal device. No immediate complication.  The patient tolerated the procedure well.  IMPRESSION: SMA angiogram demonstrates a prominent pancreaticoduodenal arcade supplying the G D A in a retrograde fashion and the entire celiac vasculature.  This is secondary to a celiac origin significant stenosis which was not able to be accessed with catheters and guide wires.  Inferior pancreaticoduodenal pseudoaneurysm demonstrated angiographically however micro catheterization could not be performed peripherally of the area to perform embolization.   Original Report Authenticated By: Judie Petit. Miles Costain, M.D.   Dg Chest Port 1 View  08/06/2012   *RADIOLOGY REPORT*  Clinical Data: Dyspnea.  No chest pain, cough or congestion.  PORTABLE CHEST - 1 VIEW  Comparison: 08/04/2012  Findings: Right IJ central venous catheter is unchanged.  Lungs are hypoinflated with mild bibasilar opacification likely a small amount of bilateral pleural fluid with atelectasis, although cannot exclude infection in the lung bases.  Remainder of the exam is unchanged.  IMPRESSION: Bibasilar opacification likely small effusions with associated atelectasis, although cannot exclude infection in the lung bases.  Right IJ central venous catheter unchanged.   Original Report Authenticated By: Elberta Fortis, M.D.   Dg Chest Port 1 View  08/04/2012   *RADIOLOGY REPORT*  Clinical Data: Central line placement  PORTABLE CHEST - 1 VIEW  Comparison: None.  Findings: Cardiomediastinal silhouette is unremarkable.  There is right IJ central line with tip in distal SVC.  No diagnostic pneumothorax.  No acute infiltrate or pulmonary edema.  IMPRESSION: Right IJ central line in place.  No  diagnostic pneumothorax.   Original Report Authenticated By: Natasha Mead, M.D.   Ct Angio Abd/pel W/ And/or W/o  08/04/2012   *RADIOLOGY REPORT*  Clinical Data:  Abdominal bleeding on prior noncontrast CT. Evaluate for source  of bleed.  CT ANGIOGRAPHY ABDOMEN AND PELVIS  Technique:  Multidetector CT imaging of the abdomen and pelvis was performed using the standard protocol during bolus administration of intravenous contrast.  Multiplanar reconstructed images including MIPs were obtained and reviewed to evaluate the vascular anatomy.  Contrast: OMNIPAQUE IOHEXOL 350 MG/ML SOLN  Comparison:  Noncontrast CT earlier today.  Findings:  There is a large area of hemorrhage seen in the right abdomen.  This measures approximately 22 x 12 cm compared with 16.5 x 10 cm previously.  Following contrast administration, there is evidence of a small pseudoaneurysm likely from a pancreaticoduodenal branch in the region of the posterior pancreatic head (image 93 of series 4 and image 34 of series 603). This may be related to prior bouts of pancreatitis.  Liver, gallbladder, spleen, pancreas, adrenals and kidneys demonstrate no focal abnormality.  There is a small amount of free fluid around the liver and spleen.  Stranding noted around the pancreas which could be related to pancreatitis or blood.  There is fluid in the transverse mesocolon also could be from pancreatitis or blood.  Moderate free fluid in the pelvis.  Large and small bowel are grossly unremarkable except for scattered colonic diverticula.  Small bowel is decompressed.  Aorta is normal caliber.  Scattered aortic calcifications.  No dissection.  No acute bony abnormality.  Degenerative changes in the lumbar spine.   Review of the MIP images confirms the above findings.  IMPRESSION: Large hematoma within the abdomen measuring up to 22 cm, increased from 16 cm previously.  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  This may be related to prior bouts of pancreatitis.  Moderate free fluid in the abdomen or pelvis.  Stranding around the pancreas and in the transverse mesocolon, question blood versus changes related to pancreatitis.  Recommend clinical correlation for elevated pancreatic enzymes.   Original Report Authenticated By: Charlett Nose, M.D.    IMPRESSION: Principal Problem:   Acute GI bleeding, spontaneous  Active Problems:   Atrial flutter, paroxysmal- noted 11/13. S/P TEE CV Nov 2013. 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 Resume beta blocker. Will give IV today, he just started po's and RN thinks he may not be able to keep anything down. Change to po in am if tolerated.   Time Spent Directly with Patient: 45 minutes  Abelino Derrick [phone] beeper 08/06/2012, 12:01 PM

## 2012-08-06 NOTE — Consult Note (Signed)
Pt. Seen and examined. Agree with the NP/PA-C note as written.  Well known patient of Dr. C with a history of mitral valve repair and PAF. He underwent TEE cardioversion by myself in 2011. At that time he was on Xarelto and remained on that medication. He saw Dr. Salena Saner in 03/2012 and was maintaining sinus rhythm without any bleeding problems. Apparently, he had acute onset mesenteric (peri-pancreatic) bleeding with significant intra-abdominal hematoma development and hypovolemic shock. His b-blocker was held and reversed. Unfortunately, as there is no good antidote for Xarelto, he could not be reversed. He required numerous units of blood transfusion. This morning he had an episode of 20 beat run of what appears to be NSVT (slightly irregular, possibly polymorphic VT). This run was proceeded by a PVC (R on T phenomenon). Fortunately, he spontaneously broke.  He denies any chest pain.  Impression: 1. NSVT secondary to R on T phenomenon 2. Recovering hemorrhagic shock secondary to mesenteric bleeding 3. History of paroxysmal, isthmus-dependent atrial flutter 4. S/p mitral valve repair  Plan: 1. Check 12-lead EKG for accurate QTC assessment and avoid QT prolonging drugs. 2. Agree with restarting short-acting b-blocker. 3. Obviously continue to withhold anticoagulation - will need to have a discussion about risk/benefit of anticoagulation in the future. Given that we may not be able to find/fix the culprit for his bleeding, the potential for life-threatening bleeding may persist and outweigh the benefit of anticoagulation for stroke reduction.  Thanks for the consult. We will follow with you. Dr. Salena Saner (his primary cardiologist) will be rounding tomorrow.  [name], MD, [facility] Attending Cardiologist [facility]

## 2012-08-06 NOTE — Progress Notes (Signed)
MD Byrum made aware of low UOP  Sean Stanley

## 2012-08-06 NOTE — Progress Notes (Signed)
Nutrition Brief Note  Malnutrition Screening Tool result is inaccurate.  Please consult if nutrition needs are identified.  Katie Ebelin Dillehay, RD, LDN Pager #: [phone] After-Hours Pager #: [phone]  

## 2012-08-06 NOTE — Progress Notes (Signed)
Pt attempting to get up out of bed on his own.  Advised that we were not getting out of bed at this time d/t medications he had received.  Pt assisted back to bed.  Pain assessed.  Pt seems slightly disoriented at times but is easily reoriented.  Bed alarm on.  Will continue to monitor pt.

## 2012-08-06 NOTE — Progress Notes (Signed)
Pt experiencing N/V.  Zofran already given.  Called Dr. Delford Field. Received on time order for Phenergan.  At this time pt states he feels better.  Will continue to monitor.

## 2012-08-06 NOTE — Progress Notes (Signed)
[facility]  Subjective  -   Feeling a little better tonight.  He was able to tolerate Jello without nausea.  He has not ambulated yet.  Short run of tachycardia today.  Abdominal pain is less   Physical Exam: Gen NAD CV:  RRR Pulm:  Non-labored breathing Abd:  Tender but soft Ext:  Warm and perfused      Assessment/Plan:  Spontaneous intraperitoneal bleed  Hypovolemic hemorrhagic shock:  He has remained stable since his initial presentation  His Hb has remained stable, however tonight it dropped 1 point, so I am giving him 2 more units of blood.  He shows no signs of active bleeding.  Hopefully, this is just a dilutional effect.  Ir was not able to embolize his source of bleeding.  I do not think that surgical exploration is warrented as he is not actively bleeding and the vessel of concern may be difficult to gain acces to given its close association with the pancreas Acute renal failure:  Tonight, his creatinine is decreasing.  The increase in Cr is from hypotension and IV contrast.  Continue hydration Cardiac:  Appreciate cardiology assistance.  Will hold anticoagulation.  Continue to monitor for AFIB/Flutter.  He did have a run of NSVT.  Beta blocker was restarted Pulm:  Acute respiratory distress  Is improved with pain control, Continue O2 and IS GI:  Slowly advance PO Gen:  Slowly increaase activity.  To be OOB tonight.  I had a lengthy conversation with his family regarding his condition and our plan of care Face to face time 45 min  BRABHAM IV, V. WELLS 08/06/2012 9:03 PM --  Filed Vitals:   08/06/12 2100  BP: 137/69  Pulse: 74  Temp: 97.7 F (36.5 C)  Resp: 18    Intake/Output Summary (Last 24 hours) at 08/06/12 2103 Last data filed at 08/06/12 2100  Gross per 24 hour  Intake   2325 ml  Output   1405 ml  Net    920 ml     Laboratory CBC    Component Value Date/Time   WBC 15.2* 08/06/2012 1600   HGB 7.2* 08/06/2012 1600   HCT 20.2* 08/06/2012 1600   PLT 140* 08/06/2012 1600    BMET    Component Value Date/Time   NA 143 08/06/2012 1600   K 4.4 08/06/2012 1600   CL 109 08/06/2012 1600   CO2 26 08/06/2012 1600   GLUCOSE 122* 08/06/2012 1600   BUN 42* 08/06/2012 1600   CREATININE 1.40* 08/06/2012 1600   CALCIUM 8.2* 08/06/2012 1600   GFRNONAA 49* 08/06/2012 1600   GFRAA 57* 08/06/2012 1600    COAG Lab Results  Component Value Date   INR 1.20 08/06/2012   INR 1.40 08/05/2012   INR 1.33 08/05/2012   No results found for this basename: PTT    Antibiotics Anti-infectives   None       V. Charlena Cross, M.D. [facility] Office: [phone] Pager:  [phone]

## 2012-08-07 DIAGNOSIS — D62 Acute posthemorrhagic anemia: Secondary | ICD-10-CM | POA: Diagnosis not present

## 2012-08-07 DIAGNOSIS — R109 Unspecified abdominal pain: Secondary | ICD-10-CM | POA: Diagnosis not present

## 2012-08-07 DIAGNOSIS — I4892 Unspecified atrial flutter: Secondary | ICD-10-CM | POA: Diagnosis not present

## 2012-08-07 DIAGNOSIS — R58 Hemorrhage, not elsewhere classified: Secondary | ICD-10-CM

## 2012-08-07 DIAGNOSIS — R578 Other shock: Secondary | ICD-10-CM | POA: Diagnosis not present

## 2012-08-07 DIAGNOSIS — T794XXA Traumatic shock, initial encounter: Secondary | ICD-10-CM | POA: Diagnosis not present

## 2012-08-07 DIAGNOSIS — I472 Ventricular tachycardia, unspecified: Secondary | ICD-10-CM

## 2012-08-07 DIAGNOSIS — I4729 Other ventricular tachycardia: Secondary | ICD-10-CM

## 2012-08-07 DIAGNOSIS — N289 Disorder of kidney and ureter, unspecified: Secondary | ICD-10-CM | POA: Diagnosis not present

## 2012-08-07 LAB — CBC
HCT: 23.7 % — ABNORMAL LOW (ref 39.0–52.0)
Hemoglobin: 8 g/dL — ABNORMAL LOW (ref 13.0–17.0)
MCH: 28.7 pg (ref 26.0–34.0)
MCHC: 34.3 g/dL (ref 30.0–36.0)
MCHC: 34.2 g/dL (ref 30.0–36.0)
Platelets: 123 10*3/uL — ABNORMAL LOW (ref 150–400)
Platelets: 122 10*3/uL — ABNORMAL LOW (ref 150–400)
RDW: 16.5 % — ABNORMAL HIGH (ref 11.5–15.5)
WBC: 10.9 10*3/uL — ABNORMAL HIGH (ref 4.0–10.5)

## 2012-08-07 LAB — PHOSPHORUS: Phosphorus: 3.1 mg/dL (ref 2.3–4.6)

## 2012-08-07 LAB — BASIC METABOLIC PANEL
Calcium: 8 mg/dL — ABNORMAL LOW (ref 8.4–10.5)
GFR calc Af Amer: 68 mL/min — ABNORMAL LOW (ref >90)
GFR calc non Af Amer: 58 mL/min — ABNORMAL LOW (ref >90)
Glucose, Bld: 105 mg/dL — ABNORMAL HIGH (ref 70–99)
Potassium: 4.1 mEq/L (ref 3.5–5.1)
Sodium: 141 mEq/L (ref 135–145)

## 2012-08-07 LAB — MAGNESIUM: Magnesium: 2.2 mg/dL (ref 1.5–2.5)

## 2012-08-07 MED ORDER — SODIUM CHLORIDE 0.9 % IV SOLN: INTRAVENOUS | Status: DC

## 2012-08-07 MED ORDER — METOPROLOL TARTRATE 12.5 MG HALF TABLET
12.5000 mg | ORAL_TABLET | Freq: Two times a day (BID) | ORAL | Status: DC
  Administered 2012-08-07 – 2012-08-08 (×4): 12.5 mg via ORAL
  Filled 2012-08-07 (×6): qty 1

## 2012-08-07 MED ORDER — ALPRAZOLAM 0.25 MG PO TABS
0.2500 mg | ORAL_TABLET | Freq: Once | ORAL | Status: AC
  Administered 2012-08-07: 0.25 mg via ORAL
  Filled 2012-08-07: qty 1

## 2012-08-07 MED ORDER — ALBUTEROL SULFATE (5 MG/ML) 0.5% IN NEBU
2.5000 mg | INHALATION_SOLUTION | RESPIRATORY_TRACT | Status: DC | PRN
  Administered 2012-08-07: 2.5 mg via RESPIRATORY_TRACT
  Filled 2012-08-07: qty 0.5

## 2012-08-07 NOTE — Progress Notes (Signed)
Utilization review completed.  

## 2012-08-07 NOTE — Progress Notes (Signed)
[facility]  Subjective: Abdominal pain significantly improved. No further nausea.   Objective: Vital signs in last 24 hours: Temp:  [97.4 F (36.3 C)-98.5 F (36.9 C)] 97.7 F (36.5 C) (05/16 1108) Pulse Rate:  [69-93] 83 (05/16 0900) Resp:  [10-28] 22 (05/16 0900) BP: (96-152)/(56-96) 114/60 mmHg (05/16 0900) SpO2:  [91 %-97 %] 94 % (05/16 0900) Weight:  [246 lb 0.5 oz (111.6 kg)] 246 lb 0.5 oz (111.6 kg) (05/16 0500)    Intake/Output from previous day: 05/15 0701 - 05/16 0700 In: 3377.5 [P.O.:200; I.V.:2480; Blood:697.5] Out: 3505 [Urine:3505] Intake/Output this shift: Total I/O In: 430 [P.O.:330; I.V.:100] Out: 75 [Urine:75]  Medications Current Facility-Administered Medications  Medication Dose Route Frequency Provider Last Rate Last Dose  . 0.9 %  sodium chloride infusion   Intravenous Continuous Leslye Peer, MD 100 mL/hr at 08/07/12 0004    . acetaminophen (TYLENOL) tablet 650 mg  650 mg Oral Q4H PRN [name], MD      . alfuzosin (UROXATRAL) 24 hr tablet 10 mg  10 mg Oral Daily Leslye Peer, MD   10 mg at 08/06/12 2201  . alum & mag hydroxide-simeth (MAALOX/MYLANTA) 200-200-20 MG/5ML suspension 30 mL  30 mL Oral Q4H PRN [name], MD   30 mL at 08/06/12 1324  . bacitracin-polymyxin b (POLYSPORIN) ophthalmic ointment   Left Eye BID Leslye Peer, MD      . fentaNYL (SUBLIMAZE) injection 100 mcg  100 mcg Intravenous Q10 min PRN Alyson Reedy, MD   100 mcg at 08/06/12 1446  . fentaNYL (SUBLIMAZE) injection 25-100 mcg  25-100 mcg Intravenous Q1H PRN Alyson Reedy, MD   100 mcg at 08/06/12 2311  . latanoprost (XALATAN) 0.005 % ophthalmic solution 1 drop  1 drop Both Eyes QHS Leslye Peer, MD   1 drop at 08/06/12 2159  . metoprolol (LOPRESSOR) injection 5 mg  5 mg Intravenous Q6H Luke K Kilroy, PA-C   5 mg at 08/07/12 0700  . ondansetron (ZOFRAN) injection 4 mg  4 mg Intravenous Q6H PRN Storm Frisk, MD   4 mg at  08/05/12 2030  . pantoprazole (PROTONIX) injection 40 mg  40 mg Intravenous Q24H Alyson Reedy, MD   40 mg at 08/06/12 1611  . promethazine (PHENERGAN) injection 12.5 mg  12.5 mg Intravenous Q4H PRN Storm Frisk, MD   12.5 mg at 08/05/12 2240    PE: General appearance: alert, cooperative and no distress Lungs: clear to auscultation bilaterally Heart: regular rate and rhythm Extremities: no LEE Pulses: 2+ and symmetric Skin: warm and dry Neurologic: Grossly normal  Lab Results:   Recent Labs  08/06/12 0400 08/06/12 1600 08/07/12 0400  WBC 17.7* 15.2* 12.6*  HGB 8.4* 7.2* 8.0*  HCT 23.6* 20.2* 23.3*  PLT 156 140* 123*   BMET  Recent Labs  08/06/12 0400 08/06/12 1600 08/07/12 0400  NA 141 143 141  K 4.7 4.4 4.1  CL 107 109 107  CO2 23 26 24   GLUCOSE 148* 122* 105*  BUN 46* 42* 36*  CREATININE 1.78* 1.40* 1.21  CALCIUM 8.2* 8.2* 8.0*   PT/INR  Recent Labs  08/05/12 0004 08/05/12 1000 08/06/12 0400  LABPROT 16.2* 16.8* 15.0  INR 1.33 1.40 1.20    Assessment/Plan  Principal Problem:   Acute GI bleeding, spontaneous  Active Problems:   Hemorrhagic shock- now stable   Coagulopathy- chronic anticoagulation with Xarelto   Bradycardia- HR 58 NSR on  admission 08/04/12   Atrial flutter, paroxysmal- noted 11/13. S/P TEE CV Nov 2013. 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 Xarelto on hold. Pt continues to have episodic NSVT on telemetry. He is now tolerating PO meds. Will switch from IV Lopressor to PO at a low dose of 12.5 mg BID. H/H remains stable at 8.0/23.3. Dr. Royann Shivers to follow and will provide further recommendation.     LOS: 3 days     Brittainy M. Sharol Harness, PA-C 08/07/2012 11:46 AM  I have seen and examined the patient along with Brittainy M. Sharol Harness, PA-C.  I have reviewed the chart, notes and new data.  I agree with PA's note.  Key new complaints: weak, pain improving Key examination changes: distended abdomen, NSR Key new findings / data: Hgb 8  PLAN: Risk of anticoagulants outweighs benefits. Best long term option is referral for RF ablation.  Thurmon Fair, MD, [facility] [phone] 08/07/2012, 5:54 PM

## 2012-08-07 NOTE — Progress Notes (Signed)
Subjective: Less abdominal pain, walked on unit, passing gas, tolerating clears  Objective: Vital signs in last 24 hours: Temp:  [97.4 F (36.3 C)-98.5 F (36.9 C)] 98.2 F (36.8 C) (05/16 0718) Pulse Rate:  [69-93] 79 (05/16 0700) Resp:  [10-28] 18 (05/16 0700) BP: (96-160)/(57-96) 116/58 mmHg (05/16 0700) SpO2:  [91 %-97 %] 94 % (05/16 0700) Weight:  [111.6 kg (246 lb 0.5 oz)] 111.6 kg (246 lb 0.5 oz) (05/16 0500)    Intake/Output from previous day: 05/15 0701 - 05/16 0700 In: 3377.5 [P.O.:200; I.V.:2480; Blood:697.5] Out: 3505 [Urine:3505] Intake/Output this shift:    General appearance: alert and cooperative Resp: clear to auscultation bilaterally Cardio: regular rate and rhythm GI: soft, less RUQ tenderness, no guarding Extremities: PAS, up in chair  Lab Results:   Recent Labs  08/06/12 1600 08/07/12 0400  WBC 15.2* 12.6*  HGB 7.2* 8.0*  HCT 20.2* 23.3*  PLT 140* 123*   BMET  Recent Labs  08/06/12 1600 08/07/12 0400  NA 143 141  K 4.4 4.1  CL 109 107  CO2 26 24  GLUCOSE 122* 105*  BUN 42* 36*  CREATININE 1.40* 1.21  CALCIUM 8.2* 8.0*   PT/INR  Recent Labs  08/05/12 1000 08/06/12 0400  LABPROT 16.8* 15.0  INR 1.40 1.20   ABG  Recent Labs  08/04/12 1720  PHART 7.295*  HCO3 15.1*    Studies/Results: Ir Angiogram Visceral Selective  08/05/2012   *RADIOLOGY REPORT*  Clinical Data: 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 Ruel Favors, M.D.  Medications:  Versed Fentanyl for conscious sedation  Guidance:  Ultrasound fluoroscopic  Fluoroscopy time:  35 minutes  Sedation time:  60 minutes  Contrast volume:  80 ml Omnipaque-300  Complications:  No immediate  PROCEDURE/FINDINGS:  Informed consent  was obtained from the patient following explanation of the procedure, risks, benefits and alternatives. The patient understands, agrees and consents for the procedure. All questions were addressed.  A time out was performed.  Maximal barrier sterile technique utilized including caps, mask, sterile gowns, sterile gloves, large sterile drape, hand hygiene, and betadine  Under sterile conditions and local anesthesia, right common femoral artery micropuncture access was performed with ultrasound.  Images obtained for documentation.  5-French sheath inserted over a Bentson guide wire.  A Chung 2.5 reverse curve catheter was formed over the bifurcation.  This catheter was utilized to select the SMA.  Selective SMA angiogram performed.  SMA angiogram:  SMA main ileocolic trunk is patent.  There are patent jejunal and colic branches.  There is enlargement and collateralization of the inferior pancreaticoduodenal arcade.  In this region there is a visualized enlarged vascular structure peripherally which correlates with the pancreaticoduodenal pseudoaneurysm.  There is retrograde filling of the gastroduodenal artery supplying the celiac vascular territory.  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  Attempts were made to access the celiac origin over a guide wire however this was unsuccessful.  SMA origin was reselected.  Magnification oblique angiograms were performed of the SMA.  Prominent inferior pancreaticoduodenal collaterals are noted.  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  Utilizing a Renegade micro catheter and a G T glidewire, selective catheterization was performed peripherally of the enlarged pancreaticoduodenal arcade.  Selective injections were performed of this branch.  However this was not the feeding vessel to the  pseudoaneurysm.  This is the dominant collateral territory supplying the G D A which retrograde fills the celiac territory. Because of these findings,  embolization was not performed of this branch.  Microcatheter and guide wire were utilized to select a second dominant branch to the pancreaticoduodenal arcade.  Selective injection was performed.  This demonstrates a small retroperitoneal branch which is not supplying the pseudoaneurysm.  Additional attempts were made to select other branches to the area however this was unsuccessful.  After additional catheter and guide wire combinations, no other branches could be selected.  At this point, the procedure was stopped.  The microcatheter and base catheter were removed.  Hemostasis obtained with an Exoseal device. No immediate complication.  The patient tolerated the procedure well.  IMPRESSION: SMA angiogram demonstrates a prominent pancreaticoduodenal arcade supplying the G D A in a retrograde fashion and the entire celiac vasculature.  This is secondary to a celiac origin significant stenosis which was not able to be accessed with catheters and guide wires.  Inferior pancreaticoduodenal pseudoaneurysm demonstrated angiographically however micro catheterization could not be performed peripherally of the area to perform embolization.   Original Report Authenticated By: Judie Petit. Miles Costain, M.D.   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 Ruel Favors, M.D.  Medications:  Versed Fentanyl for conscious sedation  Guidance:  Ultrasound fluoroscopic  Fluoroscopy time:  35 minutes  Sedation time:  60 minutes  Contrast volume:  80 ml Omnipaque-300  Complications:  No immediate  PROCEDURE/FINDINGS:  Informed consent was obtained from the patient following explanation of the procedure, risks,  benefits and alternatives. The patient understands, agrees and consents for the procedure. All questions were addressed.  A time out was performed.  Maximal barrier sterile technique utilized including caps, mask, sterile gowns, sterile gloves, large sterile drape, hand hygiene, and betadine  Under sterile conditions and local anesthesia, right common femoral artery micropuncture access was performed with ultrasound.  Images obtained for documentation.  5-French sheath inserted over a Bentson guide wire.  A Chung 2.5 reverse curve catheter was formed over the bifurcation.  This catheter was utilized to select the SMA.  Selective SMA angiogram performed.  SMA angiogram:  SMA main ileocolic trunk is patent.  There are patent jejunal and colic branches.  There is enlargement and collateralization of the inferior pancreaticoduodenal arcade.  In this region there is a visualized enlarged vascular structure peripherally which correlates with the pancreaticoduodenal pseudoaneurysm.  There is retrograde filling of the gastroduodenal artery supplying the celiac vascular territory.  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  Attempts were made to access the celiac origin over a guide wire however this was unsuccessful.  SMA origin was reselected.  Magnification oblique angiograms were performed of the SMA.  Prominent inferior pancreaticoduodenal collaterals are noted.  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  Utilizing a Renegade micro catheter and a G T glidewire, selective catheterization was performed peripherally of the enlarged pancreaticoduodenal arcade.  Selective injections were performed of this branch.  However this was not the feeding vessel to the pseudoaneurysm.  This is the dominant collateral territory supplying the G D A which retrograde fills the celiac territory. Because of these findings, embolization was not performed of this branch.  Microcatheter and  guide wire were  utilized to select a second dominant branch to the pancreaticoduodenal arcade.  Selective injection was performed.  This demonstrates a small retroperitoneal branch which is not supplying the pseudoaneurysm.  Additional attempts were made to select other branches to the area however this was unsuccessful.  After additional catheter and guide wire combinations, no other branches could be selected.  At this point, the procedure was stopped.  The microcatheter and base catheter were removed.  Hemostasis obtained with an Exoseal device. No immediate complication.  The patient tolerated the procedure well.  IMPRESSION: SMA angiogram demonstrates a prominent pancreaticoduodenal arcade supplying the G D A in a retrograde fashion and the entire celiac vasculature.  This is secondary to a celiac origin significant stenosis which was not able to be accessed with catheters and guide wires.  Inferior pancreaticoduodenal pseudoaneurysm demonstrated angiographically however micro catheterization could not be performed peripherally of the area to perform embolization.   Original Report Authenticated By: Judie Petit. Miles Costain, M.D.   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 Ruel Favors, M.D.  Medications:  Versed Fentanyl for conscious sedation  Guidance:  Ultrasound fluoroscopic  Fluoroscopy time:  35 minutes  Sedation time:  60 minutes  Contrast volume:  80 ml Omnipaque-300  Complications:  No immediate  PROCEDURE/FINDINGS:  Informed consent was obtained from the patient following explanation of the procedure, risks, benefits and alternatives. The patient understands, agrees and consents for the  procedure. All questions were addressed.  A time out was performed.  Maximal barrier sterile technique utilized including caps, mask, sterile gowns, sterile gloves, large sterile drape, hand hygiene, and betadine  Under sterile conditions and local anesthesia, right common femoral artery micropuncture access was performed with ultrasound.  Images obtained for documentation.  5-French sheath inserted over a Bentson guide wire.  A Chung 2.5 reverse curve catheter was formed over the bifurcation.  This catheter was utilized to select the SMA.  Selective SMA angiogram performed.  SMA angiogram:  SMA main ileocolic trunk is patent.  There are patent jejunal and colic branches.  There is enlargement and collateralization of the inferior pancreaticoduodenal arcade.  In this region there is a visualized enlarged vascular structure peripherally which correlates with the pancreaticoduodenal pseudoaneurysm.  There is retrograde filling of the gastroduodenal artery supplying the celiac vascular territory.  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  Attempts were made to access the celiac origin over a guide wire however this was unsuccessful.  SMA origin was reselected.  Magnification oblique angiograms were performed of the SMA.  Prominent inferior pancreaticoduodenal collaterals are noted.  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  Utilizing a Renegade micro catheter and a G T glidewire, selective catheterization was performed peripherally of the enlarged pancreaticoduodenal arcade.  Selective injections were performed of this branch.  However this was not the feeding vessel to the pseudoaneurysm.  This is the dominant collateral territory supplying the G D A which retrograde fills the celiac territory. Because of these findings, embolization was not performed of this branch.  Microcatheter and guide wire were utilized to select a second dominant branch to the pancreaticoduodenal arcade.   Selective injection was performed.  This demonstrates a small retroperitoneal branch which is not supplying the pseudoaneurysm.  Additional attempts were made to select other branches to the area however this was unsuccessful.  After additional catheter and guide wire combinations, no other branches could be selected.  At this point, the procedure was stopped.  The microcatheter and base catheter were removed.  Hemostasis obtained with an Exoseal device. No immediate complication.  The patient tolerated the procedure well.  IMPRESSION: SMA angiogram demonstrates a prominent pancreaticoduodenal arcade supplying the G D A in a retrograde fashion and the entire celiac vasculature.  This is secondary to a celiac origin significant stenosis which was not able to be accessed with catheters and guide wires.  Inferior pancreaticoduodenal pseudoaneurysm demonstrated angiographically however micro catheterization could not be performed peripherally of the area to perform embolization.   Original Report Authenticated By: Judie Petit. Miles Costain, M.D.   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 Ruel Favors, M.D.  Medications:  Versed Fentanyl for conscious sedation  Guidance:  Ultrasound fluoroscopic  Fluoroscopy time:  35 minutes  Sedation time:  60 minutes  Contrast volume:  80 ml Omnipaque-300  Complications:  No immediate  PROCEDURE/FINDINGS:  Informed consent was obtained from the patient following explanation of the procedure, risks, benefits and alternatives. The patient understands, agrees and consents for the procedure. All questions were addressed.  A time out was performed.  Maximal barrier sterile  technique utilized including caps, mask, sterile gowns, sterile gloves, large sterile drape, hand hygiene, and betadine  Under sterile conditions and local anesthesia, right common femoral artery micropuncture access was performed with ultrasound.  Images obtained for documentation.  5-French sheath inserted over a Bentson guide wire.  A Chung 2.5 reverse curve catheter was formed over the bifurcation.  This catheter was utilized to select the SMA.  Selective SMA angiogram performed.  SMA angiogram:  SMA main ileocolic trunk is patent.  There are patent jejunal and colic branches.  There is enlargement and collateralization of the inferior pancreaticoduodenal arcade.  In this region there is a visualized enlarged vascular structure peripherally which correlates with the pancreaticoduodenal pseudoaneurysm.  There is retrograde filling of the gastroduodenal artery supplying the celiac vascular territory.  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  Attempts were made to access the celiac origin over a guide wire however this was unsuccessful.  SMA origin was reselected.  Magnification oblique angiograms were performed of the SMA.  Prominent inferior pancreaticoduodenal collaterals are noted.  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  Utilizing a Renegade micro catheter and a G T glidewire, selective catheterization was performed peripherally of the enlarged pancreaticoduodenal arcade.  Selective injections were performed of this branch.  However this was not the feeding vessel to the pseudoaneurysm.  This is the dominant collateral territory supplying the G D A which retrograde fills the celiac territory. Because of these findings, embolization was not performed of this branch.  Microcatheter and guide wire were utilized to select a second dominant branch to the pancreaticoduodenal arcade.  Selective injection was performed.  This demonstrates a small retroperitoneal branch which is not  supplying the pseudoaneurysm.  Additional attempts were made to select other branches to the area however this was unsuccessful.  After additional catheter and guide wire combinations, no other branches could be selected.  At this point,  the procedure was stopped.  The microcatheter and base catheter were removed.  Hemostasis obtained with an Exoseal device. No immediate complication.  The patient tolerated the procedure well.  IMPRESSION: SMA angiogram demonstrates a prominent pancreaticoduodenal arcade supplying the G D A in a retrograde fashion and the entire celiac vasculature.  This is secondary to a celiac origin significant stenosis which was not able to be accessed with catheters and guide wires.  Inferior pancreaticoduodenal pseudoaneurysm demonstrated angiographically however micro catheterization could not be performed peripherally of the area to perform embolization.   Original Report Authenticated By: Judie Petit. Miles Costain, M.D.   Dg Chest Port 1 View  08/06/2012   *RADIOLOGY REPORT*  Clinical Data: Dyspnea.  No chest pain, cough or congestion.  PORTABLE CHEST - 1 VIEW  Comparison: 08/04/2012  Findings: Right IJ central venous catheter is unchanged.  Lungs are hypoinflated with mild bibasilar opacification likely a small amount of bilateral pleural fluid with atelectasis, although cannot exclude infection in the lung bases.  Remainder of the exam is unchanged.  IMPRESSION: Bibasilar opacification likely small effusions with associated atelectasis, although cannot exclude infection in the lung bases.  Right IJ central venous catheter unchanged.   Original Report Authenticated By: Elberta Fortis, M.D.    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 Received 2u PRBC yesterday and only partially responded.  Agree may benefit from further transfusion.  Try  full liquids. ABL anemia and hemorrhagic shock - as above AKI - due to ATN, improving after resuscitation and transfusion Mobilize I spoke to the patient at length regarding the plan of care   LOS: 3 days    Yzabelle Calles E 08/07/2012

## 2012-08-07 NOTE — Progress Notes (Signed)
Transferred to 2200 via wheelchair with monitor and O2. Tolerated well. Report given to Julian, Charity fundraiser. On medical pathway.

## 2012-08-07 NOTE — Progress Notes (Addendum)
PULMONARY  / CRITICAL CARE MEDICINE  Name: Sean Stanley MRN: [id number] DOB: October 25, 1940    ADMISSION DATE:  08/04/2012 CONSULTATION DATE:  08/04/12  REFERRING MD :  EDP PRIMARY SERVICE: PCCM  CHIEF COMPLAINT:  Abdominal Pain  BRIEF PATIENT DESCRIPTION: 72 year old male with PMH of atrial flutter on xeralto who [facility] after a day of abdominal pain, N/V and fever.  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  Patient was transferred to [facility] for vascular surgery to evaluate.  In the ED the patient was noted to remain bradycardic and BP was very fluctuant.  Patient was in severe pain upon evaluation and history was obtained from family.  SIGNIFICANT EVENTS / STUDIES:  5/13>>>Intrabdominal bleeding. 5/13 CT-angio abd >> large hematoma, pseudoaneurysm of pancreaticoduodenal branch without active bleeding 5/14 >> Went for possible embolization, unable to access the culprit vessel   LINES / TUBES: R IJ Cordis 5/13>>> 5/16  CULTURES: None  ANTIBIOTICS: None  SUBJECTIVE:  Feels better, less abd pain, up to chair, tolerating light diet Received additional 2u PRBC 5/15  VITAL SIGNS: Temp:  [97.4 F (36.3 C)-98.5 F (36.9 C)] 98.2 F (36.8 C) (05/16 0718) Pulse Rate:  [69-93] 83 (05/16 0900) Resp:  [10-28] 22 (05/16 0900) BP: (96-160)/(56-96) 114/60 mmHg (05/16 0900) SpO2:  [91 %-97 %] 94 % (05/16 0900) Weight:  [111.6 kg (246 lb 0.5 oz)] 111.6 kg (246 lb 0.5 oz) (05/16 0500) HEMODYNAMICS: CVP:  [7 mmHg] 7 mmHg VENTILATOR SETTINGS:   INTAKE / OUTPUT: Intake/Output     05/15 0701 - 05/16 0700 05/16 0701 - 05/17 0700   P.O. 200 330   I.V. (mL/kg) 2480 (22.2) 100 (0.9)   Blood 697.5    IV Piggyback     Total Intake(mL/kg) 3377.5 (30.3) 430 (3.9)   Urine (mL/kg/hr) 3505 (1.3) 75 (0.2)   Total Output 3505 75   Net -127.5 +355          PHYSICAL EXAMINATION: General:  Well appearing male, no distress, up to chair Neuro:  Alert and  interactive, moving all ext to command. HEENT:  [place]/AT, PERRL, EOM-I and -thyromegally/LAN. Cardiovascular:  RRR, Nl S1/S2, -M/R/G. Lungs:  CTA bilaterally. Abdomen:  Soft, NT, ND and +BS. Musculoskeletal:  -edema and -tenderness. Skin:  normal  LABS:  Recent Labs Lab 08/04/12 1440  08/04/12 1519 08/04/12 1541  08/04/12 1701 08/04/12 1702 08/04/12 1720  08/05/12 0004 08/05/12 0007  08/05/12 1000  08/06/12 0400 08/06/12 1600 08/07/12 0400  HGB 12.4*  --  10.6*  --   --  11.8*  --   --   < > 7.6*  --   < > 7.7*  < > 8.4* 7.2* 8.0*  WBC 9.1  --  15.6*  --   --  14.6*  --   --   < > 10.5  --   < > 13.1*  < > 17.7* 15.2* 12.6*  PLT 206  --  173  --   --  144*  --   --   < > 132*  --   < > 143*  < > 156 140* 123*  NA 137  --  137  --   --  137  --   --   --  138  --   --  140  --  141 143 141  K 3.6  --  3.6  --   --  4.2  --   --   --  4.2  --   --  4.6  --  4.7 4.4 4.1  CL 103  --  106  --   --  104  --   --   --  106  --   --  107  --  107 109 107  CO2 21  --  18*  --   --  20  --   --   --  23  --   --  25  --  23 26 24   GLUCOSE 151*  --  192*  --   --  248*  --   --   --  148*  --   --  150*  --  148* 122* 105*  BUN 22  --  22  --   --  22  --   --   --  28*  --   --  31*  --  46* 42* 36*  CREATININE 1.10  --  1.04  --   --  0.98  --   --   --  1.29  --   --  1.30  --  1.78* 1.40* 1.21  CALCIUM 8.8  --  7.6*  --   --  7.2*  --   --   --  7.4*  --   --  8.0*  --  8.2* 8.2* 8.0*  MG  --   --   --   --   < > 1.5  --   --   --   --   --   --  1.8  --  2.0  --  2.2  PHOS  --   --   --   --   < > 4.1  --   --   --   --   --   --  3.4  --  4.3  --  3.1  AST 17  --  13  --   --  12  --   --   --   --   --   --   --   --   --   --   --   ALT 12  --  9  --   --  9  --   --   --   --   --   --   --   --   --   --   --   ALKPHOS 58  --  44  --   --  41  --   --   --   --   --   --   --   --   --   --   --   BILITOT 1.8*  --  1.6*  --   --  3.0*  --   --   --   --   --   --   --   --   --    --   --   PROT 6.2  --  4.6*  --   --  4.1*  --   --   --   --   --   --   --   --   --   --   --   ALBUMIN 3.5  --  2.4*  --   --  2.3*  --   --   --   --   --   --   --   --   --   --   --  APTT  --   < > 35  --   --  30  --   --   --  33  --   --  31  --   --   --   --   INR  --   < > 1.78*  --   --  1.09  --   --   --  1.33  --   --  1.40  --  1.20  --   --   LATICACIDVEN  --   --   --  4.34*  --   --  7.1*  --   --   --  2.5*  --   --   --   --   --   --   PROBNP  --   --   --   --   --   --  235.1*  --   --   --   --   --   --   --   --   --   --   PHART  --   --   --   --   --   --   --  7.295*  --   --   --   --   --   --   --   --   --   PCO2ART  --   --   --   --   --   --   --  30.8*  --   --   --   --   --   --   --   --   --   PO2ART  --   --   --   --   --   --   --  78.0*  --   --   --   --   --   --   --   --   --   < > = values in this interval not displayed. No results found for this basename: GLUCAP,  in the last 168 hours  CXR:   ASSESSMENT / PLAN:  PULMONARY A: Resp distress, resolved P:   - Titrate O2 as needed. - IS and flutter valve.  CARDIOVASCULAR A: HTN and a-fib/flutter, hx SVT s/p cardioversion.  Hemorrhagic shock from intraabdominal bleed, stabilized P:  - coreg reversed, glucagon off 5/13; metoprolol IV started 5/15, transition back to coreg once stable - Volume resuscitation. - See H/O section. - Dr Myra Gianotti following, ? Whether any other intervention needs to be made since embolization was not possible. Following at this time  RENAL A:  Acute renal insufficiency, progressive, oliguric >> improving Lactic acidosis, resolved P:   - Monitor I/O closely. - Monitor renal function and intraabdominal pressure.  - restart his uroxatral 5/15  GASTROINTESTINAL A:  Intraabdominal hemorrhage.   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 P:   - Pain control, but careful w narcs   HEMATOLOGIC A:  Hemorrhagic shock, coagulopathic and massive transfusion,  resolved P:  - follow CBC q12h, and INR intermittently  INFECTIOUS A:  No active issues. P:   - Monitor fever curve and WBC.  ENDOCRINE A:  No history of diabetes.   P:   - follow CBG   NEUROLOGIC A:  No active issues. P:   - Monitor neuro status with narcotics.  OPTHO: P: - xalatan for his glaucoma  GLOBAL: P: - Will transfer to SDU bed 5/16, hopefully home  soon if no other interventions planned - d/c cordis   I have personally obtained a history, examined the patient, evaluated laboratory and imaging results, formulated the assessment and plan and placed orders.    Levy Pupa, MD, PhD 08/07/2012, 9:42 AM [facility] [phone] or if no answer [phone]

## 2012-08-07 NOTE — Progress Notes (Signed)
[facility] [place]  Progress note CC: large intraperitoneal bleed;CTA. There appears to be a pseudoaneurysm off of a pancreatic branch with no active bleeding   History of Present Illness  Sean Stanley is a 72 y.o. male with resolving pain from large intraperitoneal bleed. he still has RUQ discomfort but states overall abd pain much improveddenies nausea/vomiting; denies diarrhea. has had flatus; tol liquids  Significant Diagnostic Studies: CBC Lab Results  Component Value Date   WBC 12.6* 08/07/2012   HGB 8.0* 08/07/2012   HCT 23.3* 08/07/2012   MCV 83.5 08/07/2012   PLT 123* 08/07/2012    BMET    Component Value Date/Time   NA 141 08/07/2012 0400   K 4.1 08/07/2012 0400   CL 107 08/07/2012 0400   CO2 24 08/07/2012 0400   GLUCOSE 105* 08/07/2012 0400   BUN 36* 08/07/2012 0400   CREATININE 1.21 08/07/2012 0400   CALCIUM 8.0* 08/07/2012 0400   GFRNONAA 58* 08/07/2012 0400   GFRAA 68* 08/07/2012 0400    COAG Lab Results  Component Value Date   INR 1.20 08/06/2012   INR 1.40 08/05/2012   INR 1.33 08/05/2012     I/O last 3 completed shifts: In: 4577.5 [P.O.:200; I.V.:3680; Blood:697.5] Out: 3980 [Urine:3980]    Physical Examination BP Readings from Last 3 Encounters:  08/07/12 116/58  02/12/12 122/72  02/12/12 122/72   Temp Readings from Last 3 Encounters:  08/07/12 98.2 F (36.8 C) Oral  02/12/12 98.5 F (36.9 C) Oral  02/12/12 98.5 F (36.9 C) Oral   SpO2 Readings from Last 3 Encounters:  08/07/12 94%  02/12/12 96%  02/12/12 96%   Pulse Readings from Last 3 Encounters:  08/07/12 79    General: A&O x 3, WDWN male in NAD Pulmonary: normal non-labored breathing , without Rales, rhonchi,  wheezing Cardiac: Heart rate : regular ,  Abdomen:abdomen soft Neurologic: A&O X 3; Appropriate Affect ;  Speech is fluent/normal  Assessment/Plan: Sean Stanley is a 72 y.o. male with improving abdominal pain Advancing diet Acute blood loss anemia  H/H slightly improved after 2UPC - ? Additional transfusion - defer to CCM WBC down today Acute renal insuff- Scr normalizing with hydration Ok to DC to stepdown  [facility] J  08/07/2012 8:01 AM

## 2012-08-08 DIAGNOSIS — D689 Coagulation defect, unspecified: Secondary | ICD-10-CM

## 2012-08-08 DIAGNOSIS — R109 Unspecified abdominal pain: Secondary | ICD-10-CM | POA: Diagnosis not present

## 2012-08-08 DIAGNOSIS — I4892 Unspecified atrial flutter: Secondary | ICD-10-CM | POA: Diagnosis not present

## 2012-08-08 DIAGNOSIS — I739 Peripheral vascular disease, unspecified: Secondary | ICD-10-CM

## 2012-08-08 DIAGNOSIS — D62 Acute posthemorrhagic anemia: Secondary | ICD-10-CM | POA: Diagnosis not present

## 2012-08-08 DIAGNOSIS — T794XXA Traumatic shock, initial encounter: Secondary | ICD-10-CM | POA: Diagnosis not present

## 2012-08-08 DIAGNOSIS — R58 Hemorrhage, not elsewhere classified: Secondary | ICD-10-CM | POA: Diagnosis not present

## 2012-08-08 DIAGNOSIS — I472 Ventricular tachycardia: Secondary | ICD-10-CM | POA: Diagnosis not present

## 2012-08-08 DIAGNOSIS — N289 Disorder of kidney and ureter, unspecified: Secondary | ICD-10-CM | POA: Diagnosis not present

## 2012-08-08 LAB — TYPE AND SCREEN
ABO/RH(D): A POS
Unit division: 0
Unit division: 0
Unit division: 0
Unit division: 0
Unit division: 0
Unit division: 0
Unit division: 0
Unit division: 0

## 2012-08-08 LAB — CBC
HCT: 22.4 % — ABNORMAL LOW (ref 39.0–52.0)
Hemoglobin: 7.9 g/dL — ABNORMAL LOW (ref 13.0–17.0)
Hemoglobin: 7.6 g/dL — ABNORMAL LOW (ref 13.0–17.0)
MCH: 29.2 pg (ref 26.0–34.0)
MCV: 86.8 fL (ref 78.0–100.0)
Platelets: 119 10*3/uL — ABNORMAL LOW (ref 150–400)
RBC: 2.71 MIL/uL — ABNORMAL LOW (ref 4.22–5.81)
RBC: 2.58 MIL/uL — ABNORMAL LOW (ref 4.22–5.81)
RDW: 15.8 % — ABNORMAL HIGH (ref 11.5–15.5)
WBC: 10.1 10*3/uL (ref 4.0–10.5)
WBC: 9.4 10*3/uL (ref 4.0–10.5)

## 2012-08-08 MED ORDER — SENNA 8.6 MG PO TABS
1.0000 | ORAL_TABLET | Freq: Every evening | ORAL | Status: DC | PRN
  Filled 2012-08-08 (×2): qty 1

## 2012-08-08 MED ORDER — OXYCODONE HCL 5 MG PO TABS
5.0000 mg | ORAL_TABLET | ORAL | Status: DC | PRN
  Administered 2012-08-08 – 2012-08-10 (×5): 5 mg via ORAL
  Filled 2012-08-08 (×5): qty 1

## 2012-08-08 MED ORDER — LORAZEPAM 0.5 MG PO TABS
0.5000 mg | ORAL_TABLET | Freq: Four times a day (QID) | ORAL | Status: DC | PRN
  Administered 2012-08-08: 0.5 mg via ORAL
  Filled 2012-08-08: qty 1

## 2012-08-08 MED ORDER — ALPRAZOLAM 0.25 MG PO TABS
0.2500 mg | ORAL_TABLET | Freq: Three times a day (TID) | ORAL | Status: DC | PRN
  Administered 2012-08-08 – 2012-08-11 (×3): 0.25 mg via ORAL
  Filled 2012-08-08 (×3): qty 1

## 2012-08-08 MED ORDER — DOCUSATE SODIUM 100 MG PO CAPS
100.0000 mg | ORAL_CAPSULE | Freq: Two times a day (BID) | ORAL | Status: DC
  Administered 2012-08-08 – 2012-08-09 (×3): 100 mg via ORAL
  Filled 2012-08-08 (×7): qty 1

## 2012-08-08 NOTE — Progress Notes (Signed)
[facility]  DAILY PROGRESS NOTE   Subjective:  Very tired, restless, can't sleep. Not in a lot of pain . Walks without difficulty. Holding fluids down.  Objective:  Temp:  [97.9 F (36.6 C)-99.1 F (37.3 C)] 98 F (36.7 C) (05/17 0700) Pulse Rate:  [75-92] 92 (05/17 0415) Resp:  [15-28] 18 (05/17 0415) BP: (99-136)/(47-63) 99/57 mmHg (05/17 0700) SpO2:  [92 %-100 %] 95 % (05/17 0700) Weight:  [111.6 kg (246 lb 0.5 oz)] 111.6 kg (246 lb 0.5 oz) (05/16 1329) Weight change: 0 kg (0 lb)  Intake/Output from previous day: 05/16 0701 - 05/17 0700 In: 2070 [P.O.:1770; I.V.:300] Out: 1315 [Urine:1315]  Intake/Output from this shift:    Medications: Current Facility-Administered Medications  Medication Dose Route Frequency Provider Last Rate Last Dose  . acetaminophen (TYLENOL) tablet 650 mg  650 mg Oral Q4H PRN [name], MD      . albuterol (PROVENTIL) (5 MG/ML) 0.5% nebulizer solution 2.5 mg  2.5 mg Nebulization Q4H PRN Lonia Farber, MD   2.5 mg at 08/07/12 2140  . alfuzosin (UROXATRAL) 24 hr tablet 10 mg  10 mg Oral Daily Leslye Peer, MD   10 mg at 08/07/12 2122  . alum & mag hydroxide-simeth (MAALOX/MYLANTA) 200-200-20 MG/5ML suspension 30 mL  30 mL Oral Q4H PRN [name], MD   30 mL at 08/06/12 1324  . bacitracin-polymyxin b (POLYSPORIN) ophthalmic ointment   Left Eye BID Leslye Peer, MD      . docusate sodium (COLACE) capsule 100 mg  100 mg Oral BID Sherrie George, PA-C      . fentaNYL (SUBLIMAZE) injection 25-100 mcg  25-100 mcg Intravenous Q1H PRN Alyson Reedy, MD   100 mcg at 08/08/12 0940  . latanoprost (XALATAN) 0.005 % ophthalmic solution 1 drop  1 drop Both Eyes QHS Leslye Peer, MD   1 drop at 08/07/12 2122  . LORazepam (ATIVAN) tablet 0.5 mg  0.5 mg Oral Q6H PRN Michele Mcalpine, MD      . metoprolol tartrate (LOPRESSOR) tablet 12.5 mg  12.5 mg Oral BID Brittainy Simmons, PA-C   12.5 mg at 08/08/12 0929  .  ondansetron (ZOFRAN) injection 4 mg  4 mg Intravenous Q6H PRN Storm Frisk, MD   4 mg at 08/05/12 2030  . oxyCODONE (Oxy IR/ROXICODONE) immediate release tablet 5 mg  5 mg Oral Q4H PRN Michele Mcalpine, MD      . promethazine (PHENERGAN) injection 12.5 mg  12.5 mg Intravenous Q4H PRN Storm Frisk, MD   12.5 mg at 08/05/12 2240  . senna (SENOKOT) tablet 8.6 mg  1 tablet Oral QHS PRN Sherrie George, PA-C        Physical Exam: General appearance: alert, cooperative, no distress and pale Neck: no adenopathy, no carotid bruit, no JVD, supple, symmetrical, trachea midline and thyroid not enlarged, symmetric, no tenderness/mass/nodules Lungs: clear to auscultation bilaterally Heart: regular rate and rhythm, S1, S2 normal, no murmur, click, rub or gallop and occasional ectopy Abdomen: Distended, minimally tender, bowel sounds are present Extremities: extremities normal, atraumatic, no cyanosis or edema Pulses: 2+ and symmetric Skin: Skin color, texture, turgor normal. No rashes or lesions Neurologic: Grossly normal  Lab Results: Results for orders placed during the hospital encounter of 08/04/12 (from the past 48 hour(s))  CBC     Status: Abnormal   Collection Time    08/06/12  4:00 PM      Result Value  Range   WBC 15.2 (*) 4.0 - 10.5 K/uL   RBC 2.38 (*) 4.22 - 5.81 MIL/uL   Hemoglobin 7.2 (*) 13.0 - 17.0 g/dL   HCT 86.5 (*) 78.4 - 69.6 %   MCV 84.9  78.0 - 100.0 fL   MCH 30.3  26.0 - 34.0 pg   MCHC 35.6  30.0 - 36.0 g/dL   RDW 29.5  28.4 - 13.2 %   Platelets 140 (*) 150 - 400 K/uL  BASIC METABOLIC PANEL     Status: Abnormal   Collection Time    08/06/12  4:00 PM      Result Value Range   Sodium 143  135 - 145 mEq/L   Potassium 4.4  3.5 - 5.1 mEq/L   Chloride 109  96 - 112 mEq/L   CO2 26  19 - 32 mEq/L   Glucose, Bld 122 (*) 70 - 99 mg/dL   BUN 42 (*) 6 - 23 mg/dL   Creatinine, Ser 4.40 (*) 0.50 - 1.35 mg/dL   Calcium 8.2 (*) 8.4 - 10.5 mg/dL   GFR calc non Af Amer 49 (*)  >90 mL/min   GFR calc Af Amer 57 (*) >90 mL/min   Comment:            The eGFR has been calculated     using the CKD EPI equation.     This calculation has not been     validated in all clinical     situations.     eGFR's persistently     <90 mL/min signify     possible Chronic Kidney Disease.  CBC     Status: Abnormal   Collection Time    08/07/12  4:00 AM      Result Value Range   WBC 12.6 (*) 4.0 - 10.5 K/uL   RBC 2.79 (*) 4.22 - 5.81 MIL/uL   Hemoglobin 8.0 (*) 13.0 - 17.0 g/dL   HCT 10.2 (*) 72.5 - 36.6 %   MCV 83.5  78.0 - 100.0 fL   MCH 28.7  26.0 - 34.0 pg   MCHC 34.3  30.0 - 36.0 g/dL   RDW 44.0 (*) 34.7 - 42.5 %   Platelets 123 (*) 150 - 400 K/uL  BASIC METABOLIC PANEL     Status: Abnormal   Collection Time    08/07/12  4:00 AM      Result Value Range   Sodium 141  135 - 145 mEq/L   Potassium 4.1  3.5 - 5.1 mEq/L   Chloride 107  96 - 112 mEq/L   CO2 24  19 - 32 mEq/L   Glucose, Bld 105 (*) 70 - 99 mg/dL   BUN 36 (*) 6 - 23 mg/dL   Creatinine, Ser 9.56  0.50 - 1.35 mg/dL   Calcium 8.0 (*) 8.4 - 10.5 mg/dL   GFR calc non Af Amer 58 (*) >90 mL/min   GFR calc Af Amer 68 (*) >90 mL/min   Comment:            The eGFR has been calculated     using the CKD EPI equation.     This calculation has not been     validated in all clinical     situations.     eGFR's persistently     <90 mL/min signify     possible Chronic Kidney Disease.  MAGNESIUM     Status: None   Collection Time    08/07/12  4:00 AM  Result Value Range   Magnesium 2.2  1.5 - 2.5 mg/dL  PHOSPHORUS     Status: None   Collection Time    08/07/12  4:00 AM      Result Value Range   Phosphorus 3.1  2.3 - 4.6 mg/dL  CBC     Status: Abnormal   Collection Time    08/07/12  4:50 PM      Result Value Range   WBC 10.9 (*) 4.0 - 10.5 K/uL   RBC 2.82 (*) 4.22 - 5.81 MIL/uL   Hemoglobin 8.1 (*) 13.0 - 17.0 g/dL   HCT 40.9 (*) 81.1 - 91.4 %   MCV 84.0  78.0 - 100.0 fL   MCH 28.7  26.0 - 34.0 pg    MCHC 34.2  30.0 - 36.0 g/dL   RDW 78.2 (*) 95.6 - 21.3 %   Platelets 122 (*) 150 - 400 K/uL  CBC     Status: Abnormal   Collection Time    08/08/12  4:02 AM      Result Value Range   WBC 10.1  4.0 - 10.5 K/uL   RBC 2.71 (*) 4.22 - 5.81 MIL/uL   Hemoglobin 7.9 (*) 13.0 - 17.0 g/dL   HCT 08.6 (*) 57.8 - 46.9 %   MCV 85.2  78.0 - 100.0 fL   MCH 29.2  26.0 - 34.0 pg   MCHC 34.2  30.0 - 36.0 g/dL   RDW 62.9 (*) 52.8 - 41.3 %   Platelets 119 (*) 150 - 400 K/uL   Comment: REPEATED TO VERIFY     SPECIMEN CHECKED FOR CLOTS     PLATELET COUNT CONFIRMED BY SMEAR    Imaging: No results found.  Assessment:  1. Principal Problem: 2.   Acute GI bleeding, spontaneous  3. Active Problems: 4.   Hemorrhagic shock- now stable 5.   Coagulopathy- chronic anticoagulation with Xarelto 6.   Bradycardia- HR 58 NSR on admission 08/04/12 7.   Atrial flutter, paroxysmal- noted 11/13. S/P TEE CV Nov 2013. Short runs of breakthough PAF since adm 8.   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   Normal coronary arteries at cath 2006 (false positive Nuc) 10.   PVD, < 49% carotid, 50% RSCA 11.   Contrast media allergy 12.   Acute renal insufficiency 13.   NSVT (nonsustained ventricular tachycardia) 14.   Plan:  1. I have reviewed his telemetry strips and I think he mostly has PACs and atrial couplets. Occasionally, these occur with abberant conduction (sometimes both PACs, other times only the second one). The T waves of the wide complex beats are frequently oversensed, hence the label of nonsustained VT provided by the monitor. Even if some of these events are ventricular in origin, they are more reflective of his hyperadrenergic state and not likely to be associated with adverse outcome since his heart is otherwise structurally normal. 2. Stable (low )hemoglobin 3. Visceral artery pseudoaneurysm. At this junction I would avoid all anticoagulant or antiplatelet  agents. If atrial flutter recurs, refer to EPS for ablation.  Time Spent Directly with Patient:  35 minutes  Length of Stay:  LOS: 4 days    Cydne Grahn 08/08/2012, 11:32 AM

## 2012-08-08 NOTE — Progress Notes (Signed)
Physical Therapy Evaluation Patient Details Name: Sean Stanley MRN: [id number] DOB: October 17, 1940 Today's Date: 08/08/2012 Time: 4098-1191 PT Time Calculation (min): 28 min  PT Assessment / Plan / Recommendation Clinical Impression  Pt s/p GIB and afib with decr mobility secondary to decr endurance and decr safety awareness with Rw.  Will benefit from PT to address endurance and safety.  Should be able to d/c home with assist prn.  May need RW and 3N1 and HHPT initially.    PT Assessment  Patient needs continued PT services    Follow Up Recommendations  Home health PT;Supervision - Intermittent                Equipment Recommendations  Rolling walker with 5" wheels;Other (comment) (3N1)         Frequency Min 3X/week    Precautions / Restrictions Precautions Precautions: None Restrictions Weight Bearing Restrictions: No   Pertinent Vitals/Pain Desat to 82 % on RA.  Placed on 4LO2 with ambulation to keep sats >90%.  Pain in abdomen 8/10 and notified nursing.       Mobility  Bed Mobility Bed Mobility: Not assessed Transfers Transfers: Sit to Stand;Stand to Sit Sit to Stand: 4: Min assist;With upper extremity assist;From chair/3-in-1 Stand to Sit: 4: Min assist;With upper extremity assist;To chair/3-in-1 Details for Transfer Assistance: Pt needed cues for hand placement.  Takes incr time to stand. Ambulation/Gait Ambulation/Gait Assistance: 4: Min assist Ambulation Distance (Feet): 320 Feet Assistive device: Rolling walker Ambulation/Gait Assistance Details: Pt needed cues to stay close to RW.  took several standing rest breaks as well.  Needed cues for pursed lip breathing.  Had to incr O2 to 4L to keep sats >90% with ambulation.   Gait Pattern: Step-through pattern;Decreased step length - left;Decreased step length - right;Decreased stride length;Trunk flexed;Wide base of support Gait velocity: decreased Stairs: No Wheelchair Mobility Wheelchair Mobility: No          PT Diagnosis: Generalized weakness;Acute pain  PT Problem List: Decreased activity tolerance;Decreased balance;Decreased mobility;Decreased knowledge of use of DME;Decreased safety awareness;Decreased knowledge of precautions;Pain PT Treatment Interventions: DME instruction;Gait training;Stair training;Functional mobility training;Therapeutic activities;Therapeutic exercise;Balance training;Patient/family education   PT Goals Acute Rehab PT Goals PT Goal Formulation: With patient Time For Goal Achievement: 08/15/12 Potential to Achieve Goals: Good Pt will go Supine/Side to Sit: Independently PT Goal: Supine/Side to Sit - Progress: Goal set today Pt will go Sit to Stand: Independently PT Goal: Sit to Stand - Progress: Goal set today Pt will Ambulate: >150 feet;with modified independence;with least restrictive assistive device PT Goal: Ambulate - Progress: Goal set today Pt will Go Up / Down Stairs: Flight;with supervision;with least restrictive assistive device PT Goal: Up/Down Stairs - Progress: Goal set today  Visit Information  Last PT Received On: 08/08/12 Assistance Needed: +1    Subjective Data  Subjective: "I want to go home." Patient Stated Goal: To go home   Prior Functioning  Home Living Lives With: Spouse Available Help at Discharge: Family;Available 24 hours/day Type of Home: House Home Access: Stairs to enter [employer] of Steps: 12 Entrance Stairs-Rails: Can reach both Home Layout: One level Bathroom Shower/Tub: Tub/shower unit;Door [employer] Toilet: Standard Bathroom Accessibility: Yes How Accessible: Accessible via walker Home Adaptive Equipment: Grab bars in shower;Hand-held shower hose Prior Function Level of Independence: Independent Able to Take Stairs?: Yes Driving: Yes Vocation: Retired Musician: No difficulties Dominant Hand: Right    Cognition  Cognition Arousal/Alertness: Awake/alert Behavior During Therapy:  WFL for tasks assessed/performed Overall  Cognitive Status: Within Functional Limits for tasks assessed    Extremity/Trunk Assessment Right Lower Extremity Assessment RLE ROM/Strength/Tone: [facility] for tasks assessed Left Lower Extremity Assessment LLE ROM/Strength/Tone: [facility] for tasks assessed Trunk Assessment Trunk Assessment: Normal   Balance Static Standing Balance Static Standing - Balance Support: Bilateral upper extremity supported;During functional activity Static Standing - Level of Assistance: 5: Stand by assistance Static Standing - Comment/# of Minutes: 2 minutes with RW  End of Session PT - End of Session Equipment Utilized During Treatment: Gait belt;Oxygen Activity Tolerance: Patient limited by fatigue;Patient limited by pain Patient left: in chair;with call bell/phone within reach Nurse Communication: Mobility status       INGOLD,[name] 08/08/2012, 10:15 AM [name] [phone] (pager)

## 2012-08-08 NOTE — Progress Notes (Signed)
  Subjective: Still sore and tight, can't sleep well because of discomfort.  The discomfort is much improved since admission.  Tolerating PO.    Objective: Vital signs in last 24 hours: Temp:  [97.7 F (36.5 C)-99.1 F (37.3 C)] 98 F (36.7 C) (05/17 0700) Pulse Rate:  [75-92] 92 (05/17 0415) Resp:  [15-28] 18 (05/17 0415) BP: (99-136)/(47-63) 99/57 mmHg (05/17 0700) SpO2:  [92 %-100 %] 95 % (05/17 0700) Weight:  [111.6 kg (246 lb 0.5 oz)] 111.6 kg (246 lb 0.5 oz) (05/16 1329)  1770 PO recorded Diet: cardiac BP down some H/H stable  Intake/Output from previous day: 05/16 0701 - 05/17 0700 In: 2070 [P.O.:1770; I.V.:300] Out: 1315 [Urine:1315] Intake/Output this shift:    General appearance: alert, cooperative, no distress and tired, easily fatigued when he gets up. Resp: clear to auscultation bilaterally GI: soft, distended sl tender in RLQ.    Lab Results:   Recent Labs  08/07/12 1650 08/08/12 0402  WBC 10.9* 10.1  HGB 8.1* 7.9*  HCT 23.7* 23.1*  PLT 122* 119*    BMET  Recent Labs  08/06/12 1600 08/07/12 0400  NA 143 141  K 4.4 4.1  CL 109 107  CO2 26 24  GLUCOSE 122* 105*  BUN 42* 36*  CREATININE 1.40* 1.21  CALCIUM 8.2* 8.0*   PT/INR  Recent Labs  08/06/12 0400  LABPROT 15.0  INR 1.20     Recent Labs Lab 08/04/12 1440 08/04/12 1519 08/04/12 1701  AST 17 13 12   ALT 12 9 9   ALKPHOS 58 44 41  BILITOT 1.8* 1.6* 3.0*  PROT 6.2 4.6* 4.1*  ALBUMIN 3.5 2.4* 2.3*     Lipase     Component Value Date/Time   LIPASE 29 08/05/2012 0004     Studies/Results: No results found.  Medications: . alfuzosin  10 mg Oral Daily  . bacitracin-polymyxin b   Left Eye BID  . latanoprost  1 drop Both Eyes QHS  . metoprolol tartrate  12.5 mg Oral BID    Assessment/Plan large intraperitoneal bleed; pseudoaneurysm off of a pancreatic branch with no active bleeding.   Coagulopathy- chronic anticoagulation with Xarelto Atrial flutter, paroxysmal-  noted 11/13. S/P TEE CV Nov 2013. Short runs of breakthough PAF since adm Hemorrhagic shock- now stable Acute renal failure due to shock. Hypertension  Hypercholesteremia  Mitral valve disease  annuloplasty 2002 [facility]  Carotid stenosis  03/10/2007 right & left bulbs & ICAs 0-49% reduction ,right subclavian 50% reduction    Plan:  Continue to monitor, add some colace and Senakot for constipation. Discussed with 3 family members. Recommend staying off anticoagulation. Plan regarding reimaging per [facility].     LOS: 4 days    Sean Stanley,Sean Stanley 08/08/2012

## 2012-08-08 NOTE — Progress Notes (Addendum)
[facility]  Subjective  - He continues to have abdominal pain and difficulty sleeping due to this discomfort.  He states his pain is better than before he arrived at the hospital. He is taking PO'S well and walking daily.  Objective 105/47 92 99.1 F (37.3 C) (Oral) 18 97%  Intake/Output Summary (Last 24 hours) at 08/08/12 1610 Last data filed at 08/08/12 0600  Gross per 24 hour  Intake   1970 ml  Output   1240 ml  Net    730 ml   A&O x 3 Abdomin firm, but not distended.  BS auscultated. Distally palpable DP/PT pulses.  Bilateral feet and ankle edema. Lungs CTA  Recent Labs  08/07/12 1650 08/08/12 0402  WBC 10.9* 10.1  HGB 8.1* 7.9*  HCT 23.7* 23.1*  PLT 122* 119*   Assessment/Planning: large intraperitoneal bleed;CTA. There appears to be a pseudoaneurysm off of a pancreatic branch with no active bleeding  4 Units last 08/07/2012 PRBC 4 units FFP and 2  Units platletes.   Pain medication issues to be handled by primary physician.  Clinton Gallant [facility] 08/08/2012 8:22 AM  Agree with above.  Pain is improving. He has bowel sounds. Will only plan exploration for evidence of active bleeding.  Waverly Ferrari, MD, FACS Beeper [phone] 08/08/2012

## 2012-08-08 NOTE — Progress Notes (Signed)
PULMONARY  / CRITICAL CARE MEDICINE  Name: Sean Stanley MRN: [id number] DOB: [birth year]    ADMISSION DATE:  08/04/2012 CONSULTATION DATE:  08/04/12  REFERRING MD :  EDP PRIMARY SERVICE: PCCM  CHIEF COMPLAINT:  Abdominal Pain  BRIEF PATIENT DESCRIPTION: 72 year old male with PMH of atrial flutter on xeralto who [place] [facility] after a day of abdominal pain, N/V and fever.  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  Patient was transferred to [facility] for vascular surgery to evaluate.  In the ED the patient was noted to remain bradycardic and BP was very fluctuant.  Patient was in severe pain upon evaluation and history was obtained from family.  SIGNIFICANT EVENTS / STUDIES:  5/13>>>Intrabdominal bleeding. 5/13 CT-angio abd >> large hematoma, pseudoaneurysm of pancreaticoduodenal branch without active bleeding 5/14 >> Went for possible embolization, unable to access the culprit vessel   LINES / TUBES: R IJ Cordis 5/13>>> 5/16  CULTURES: None  ANTIBIOTICS: None  SUBJECTIVE:  Feels better, less abd pain, up to chair, tolerating light diet Received additional 2u PRBC 5/15  VITAL SIGNS: Temp:  [97.9 F (36.6 C)-99.1 F (37.3 C)] 98 F (36.7 C) (05/17 0700) Pulse Rate:  [75-92] 92 (05/17 0415) Resp:  [15-28] 18 (05/17 0415) BP: (99-136)/(47-63) 99/57 mmHg (05/17 0700) SpO2:  [92 %-100 %] 95 % (05/17 0700) Weight:  [111.6 kg (246 lb 0.5 oz)] 111.6 kg (246 lb 0.5 oz) (05/16 1329) HEMODYNAMICS:   VENTILATOR SETTINGS:   INTAKE / OUTPUT: Intake/Output     05/16 0701 - 05/17 0700 05/17 0701 - 05/18 0700   P.O. 1770    I.V. (mL/kg) 300 (2.7)    Blood     Total Intake(mL/kg) 2070 (18.5)    Urine (mL/kg/hr) 1315 (0.5)    Total Output 1315     Net +755          Urine Occurrence 1 x      PHYSICAL EXAMINATION: General:  Well appearing male, no distress, up to chair Neuro:  Alert and interactive, moving all ext to command. HEENT:  [place]/AT,  PERRL, EOM-I and -thyromegally/LAN. Cardiovascular:  RRR, Nl S1/S2, -M/R/G. Lungs:  CTA bilaterally. Abdomen:  Soft, NT, ND and +BS. Musculoskeletal:  -edema and -tenderness. Skin:  normal  LABS:  Recent Labs Lab 08/04/12 1440  08/04/12 1519 08/04/12 1541  08/04/12 1701 08/04/12 1702 08/04/12 1720  08/05/12 0004 08/05/12 0007  08/05/12 1000  08/06/12 0400 08/06/12 1600 08/07/12 0400 08/07/12 1650 08/08/12 0402  HGB 12.4*  --  10.6*  --   --  11.8*  --   --   < > 7.6*  --   < > 7.7*  < > 8.4* 7.2* 8.0* 8.1* 7.9*  WBC 9.1  --  15.6*  --   --  14.6*  --   --   < > 10.5  --   < > 13.1*  < > 17.7* 15.2* 12.6* 10.9* 10.1  PLT 206  --  173  --   --  144*  --   --   < > 132*  --   < > 143*  < > 156 140* 123* 122* 119*  NA 137  --  137  --   --  137  --   --   --  138  --   --  140  --  141 143 141  --   --   K 3.6  --  3.6  --   --  4.2  --   --   --  4.2  --   --  4.6  --  4.7 4.4 4.1  --   --   CL 103  --  106  --   --  104  --   --   --  106  --   --  107  --  107 109 107  --   --   CO2 21  --  18*  --   --  20  --   --   --  23  --   --  25  --  23 26 24   --   --   GLUCOSE 151*  --  192*  --   --  248*  --   --   --  148*  --   --  150*  --  148* 122* 105*  --   --   BUN 22  --  22  --   --  22  --   --   --  28*  --   --  31*  --  46* 42* 36*  --   --   CREATININE 1.10  --  1.04  --   --  0.98  --   --   --  1.29  --   --  1.30  --  1.78* 1.40* 1.21  --   --   CALCIUM 8.8  --  7.6*  --   --  7.2*  --   --   --  7.4*  --   --  8.0*  --  8.2* 8.2* 8.0*  --   --   MG  --   --   --   --   < > 1.5  --   --   --   --   --   --  1.8  --  2.0  --  2.2  --   --   PHOS  --   --   --   --   < > 4.1  --   --   --   --   --   --  3.4  --  4.3  --  3.1  --   --   AST 17  --  13  --   --  12  --   --   --   --   --   --   --   --   --   --   --   --   --   ALT 12  --  9  --   --  9  --   --   --   --   --   --   --   --   --   --   --   --   --   ALKPHOS 58  --  44  --   --  41  --   --   --    --   --   --   --   --   --   --   --   --   --   BILITOT 1.8*  --  1.6*  --   --  3.0*  --   --   --   --   --   --   --   --   --   --   --   --   --  PROT 6.2  --  4.6*  --   --  4.1*  --   --   --   --   --   --   --   --   --   --   --   --   --   ALBUMIN 3.5  --  2.4*  --   --  2.3*  --   --   --   --   --   --   --   --   --   --   --   --   --   APTT  --   < > 35  --   --  30  --   --   --  33  --   --  31  --   --   --   --   --   --   INR  --   < > 1.78*  --   --  1.09  --   --   --  1.33  --   --  1.40  --  1.20  --   --   --   --   LATICACIDVEN  --   --   --  4.34*  --   --  7.1*  --   --   --  2.5*  --   --   --   --   --   --   --   --   PROBNP  --   --   --   --   --   --  235.1*  --   --   --   --   --   --   --   --   --   --   --   --   PHART  --   --   --   --   --   --   --  7.295*  --   --   --   --   --   --   --   --   --   --   --   PCO2ART  --   --   --   --   --   --   --  30.8*  --   --   --   --   --   --   --   --   --   --   --   PO2ART  --   --   --   --   --   --   --  78.0*  --   --   --   --   --   --   --   --   --   --   --   < > = values in this interval not displayed. No results found for this basename: GLUCAP,  in the last 168 hours  CXR>>  IMPRESSION:  Bibasilar opacification likely small effusions with associated atelectasis, although cannot exclude infection in the lung bases.  Right IJ central venous catheter unchanged.   ASSESSMENT / PLAN:  PULMONARY A: Resp distress, resolved P:   - Titrate O2 as needed. - IS and flutter valve.  CARDIOVASCULAR A: HTN and a-fib/flutter, hx SVT s/p cardioversion.  Hemorrhagic shock from intraabdominal bleed, stabilized P:  - coreg reversed, glucagon off 5/13; metoprolol IV started 5/15, transition back to coreg once stable - Volume resuscitation. -  See H/O section. - Dr Mora Bellman al following, ? Whether any other intervention needs to be made since embolization was not possible. Following at this  time  RENAL A:  Acute renal insufficiency, progressive, oliguric >> improving Lactic acidosis, resolved P:   - Monitor I/O closely. - Monitor renal function and intraabdominal pressure.  - restart his uroxatral 5/15  GASTROINTESTINAL A:  Intraabdominal hemorrhage.   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 P:   - Pain control, but careful w narcs   HEMATOLOGIC A:  Hemorrhagic shock, coagulopathic and massive transfusion, resolved P:  - follow CBC q12h, and INR intermittently  INFECTIOUS A:  No active issues. P:   - Monitor fever curve and WBC.  ENDOCRINE A:  No history of diabetes.   P:   - follow CBG   NEUROLOGIC A:  No active issues. P:   - Monitor neuro status with narcotics.  OPTHO: P: - xalatan for his glaucoma  GLOBAL: P: - transferred to SDU bed 5/16 - d/c cordis     Gordan Grell M. Kriste Basque, MD  08/08/2012, 11:09 AM [facility]

## 2012-08-09 DIAGNOSIS — R58 Hemorrhage, not elsewhere classified: Secondary | ICD-10-CM

## 2012-08-09 DIAGNOSIS — I4892 Unspecified atrial flutter: Secondary | ICD-10-CM | POA: Diagnosis not present

## 2012-08-09 DIAGNOSIS — R109 Unspecified abdominal pain: Secondary | ICD-10-CM | POA: Diagnosis not present

## 2012-08-09 DIAGNOSIS — N289 Disorder of kidney and ureter, unspecified: Secondary | ICD-10-CM | POA: Diagnosis not present

## 2012-08-09 DIAGNOSIS — D689 Coagulation defect, unspecified: Secondary | ICD-10-CM | POA: Diagnosis not present

## 2012-08-09 DIAGNOSIS — I472 Ventricular tachycardia: Secondary | ICD-10-CM | POA: Diagnosis not present

## 2012-08-09 LAB — CBC
HCT: 22.5 % — ABNORMAL LOW (ref 39.0–52.0)
Hemoglobin: 7.6 g/dL — ABNORMAL LOW (ref 13.0–17.0)
MCH: 29.2 pg (ref 26.0–34.0)
MCHC: 33.8 g/dL (ref 30.0–36.0)
RBC: 2.6 MIL/uL — ABNORMAL LOW (ref 4.22–5.81)

## 2012-08-09 MED ORDER — MAGNESIUM HYDROXIDE 400 MG/5ML PO SUSP
30.0000 mL | Freq: Two times a day (BID) | ORAL | Status: DC | PRN
  Administered 2012-08-09: 30 mL via ORAL
  Filled 2012-08-09: qty 30

## 2012-08-09 MED ORDER — BISACODYL 10 MG RE SUPP
10.0000 mg | Freq: Every day | RECTAL | Status: DC | PRN
  Administered 2012-08-09: 10 mg via RECTAL
  Filled 2012-08-09: qty 1

## 2012-08-09 MED ORDER — METOPROLOL TARTRATE 25 MG PO TABS
25.0000 mg | ORAL_TABLET | Freq: Two times a day (BID) | ORAL | Status: DC
  Administered 2012-08-09 – 2012-08-11 (×5): 25 mg via ORAL
  Filled 2012-08-09 (×6): qty 1

## 2012-08-09 NOTE — Progress Notes (Signed)
PULMONARY  / CRITICAL CARE MEDICINE  Name: Sean Stanley MRN: [id number] DOB: 11-23-1940    ADMISSION DATE:  08/04/2012 CONSULTATION DATE:  08/04/12  REFERRING MD :  EDP PRIMARY SERVICE: PCCM  CHIEF COMPLAINT:  Abdominal Pain  BRIEF PATIENT DESCRIPTION: 72 year old male with PMH of atrial flutter on xeralto who [place] [facility] after a day of abdominal pain, N/V and fever.  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  Patient was transferred to [facility] for vascular surgery to evaluate.  In the ED the patient was noted to remain bradycardic and BP was very fluctuant.  Patient was in severe pain upon evaluation and history was obtained from family.  SIGNIFICANT EVENTS / STUDIES:  5/13>>>Intrabdominal bleeding. 5/13 CT-angio abd >> large hematoma, pseudoaneurysm of pancreaticoduodenal branch without active bleeding 5/14 >> Went for possible embolization, unable to access the culprit vessel  5/17-18> followed by [facility], felt to be stable w/ severe dis as noted  LINES / TUBES: R IJ Cordis 5/13>>> 5/16  CULTURES: None  ANTIBIOTICS: None  SUBJECTIVE:  Feels better, less abd pain, up to chair, tolerating light diet Received additional 2u PRBC 5/15  VITAL SIGNS: Temp:  [97.9 F (36.6 C)-98.7 F (37.1 C)] 98.1 F (36.7 C) (05/18 0400) Pulse Rate:  [79-84] 84 (05/18 0750) Resp:  [12-24] 18 (05/18 0750) BP: (102-136)/(59-76) 134/71 mmHg (05/18 0750) SpO2:  [95 %-98 %] 96 % (05/18 0750) Weight:  [111.8 kg (246 lb 7.6 oz)] 111.8 kg (246 lb 7.6 oz) (05/18 0400) HEMODYNAMICS:   VENTILATOR SETTINGS:   INTAKE / OUTPUT: Intake/Output     05/17 0701 - 05/18 0700 05/18 0701 - 05/19 0700   P.O. 220    I.V. (mL/kg)     Total Intake(mL/kg) 220 (2)    Urine (mL/kg/hr) 900 (0.3)    Total Output 900     Net -680          Urine Occurrence 1 x      PHYSICAL EXAMINATION: General:  Well appearing male, no distress, up to chair Neuro:  Alert and  interactive, moving all ext to command. HEENT:  [place]/AT, PERRL, EOM-I and -thyromegally/LAN. Cardiovascular:  RRR, Nl S1/S2, -M/R/G. Lungs:  CTA bilaterally. Abdomen:  Soft, NT, ND and +BS. Musculoskeletal:  -edema and -tenderness. Skin:  normal  LABS:  Recent Labs Lab 08/04/12 1440  08/04/12 1519 08/04/12 1541  08/04/12 1701 08/04/12 1702 08/04/12 1720  08/05/12 0004 08/05/12 0007  08/05/12 1000  08/06/12 0400 08/06/12 1600 08/07/12 0400  08/08/12 0402 08/08/12 1635 08/09/12 0400  HGB 12.4*  --  10.6*  --   --  11.8*  --   --   < > 7.6*  --   < > 7.7*  < > 8.4* 7.2* 8.0*  < > 7.9* 7.6* 7.6*  WBC 9.1  --  15.6*  --   --  14.6*  --   --   < > 10.5  --   < > 13.1*  < > 17.7* 15.2* 12.6*  < > 10.1 9.4 9.4  PLT 206  --  173  --   --  144*  --   --   < > 132*  --   < > 143*  < > 156 140* 123*  < > 119* 115* 115*  NA 137  --  137  --   --  137  --   --   --  138  --   --  140  --  141 143 141  --   --   --   --   K 3.6  --  3.6  --   --  4.2  --   --   --  4.2  --   --  4.6  --  4.7 4.4 4.1  --   --   --   --   CL 103  --  106  --   --  104  --   --   --  106  --   --  107  --  107 109 107  --   --   --   --   CO2 21  --  18*  --   --  20  --   --   --  23  --   --  25  --  23 26 24   --   --   --   --   GLUCOSE 151*  --  192*  --   --  248*  --   --   --  148*  --   --  150*  --  148* 122* 105*  --   --   --   --   BUN 22  --  22  --   --  22  --   --   --  28*  --   --  31*  --  46* 42* 36*  --   --   --   --   CREATININE 1.10  --  1.04  --   --  0.98  --   --   --  1.29  --   --  1.30  --  1.78* 1.40* 1.21  --   --   --   --   CALCIUM 8.8  --  7.6*  --   --  7.2*  --   --   --  7.4*  --   --  8.0*  --  8.2* 8.2* 8.0*  --   --   --   --   MG  --   --   --   --   < > 1.5  --   --   --   --   --   --  1.8  --  2.0  --  2.2  --   --   --   --   PHOS  --   --   --   --   < > 4.1  --   --   --   --   --   --  3.4  --  4.3  --  3.1  --   --   --   --   AST 17  --  13  --   --  12  --   --    --   --   --   --   --   --   --   --   --   --   --   --   --   ALT 12  --  9  --   --  9  --   --   --   --   --   --   --   --   --   --   --   --   --   --   --   ALKPHOS 58  --  44  --   --  41  --   --   --   --   --   --   --   --   --   --   --   --   --   --   --   BILITOT 1.8*  --  1.6*  --   --  3.0*  --   --   --   --   --   --   --   --   --   --   --   --   --   --   --   PROT 6.2  --  4.6*  --   --  4.1*  --   --   --   --   --   --   --   --   --   --   --   --   --   --   --   ALBUMIN 3.5  --  2.4*  --   --  2.3*  --   --   --   --   --   --   --   --   --   --   --   --   --   --   --   APTT  --   < > 35  --   --  30  --   --   --  33  --   --  31  --   --   --   --   --   --   --   --   INR  --   < > 1.78*  --   --  1.09  --   --   --  1.33  --   --  1.40  --  1.20  --   --   --   --   --   --   LATICACIDVEN  --   --   --  4.34*  --   --  7.1*  --   --   --  2.5*  --   --   --   --   --   --   --   --   --   --   PROBNP  --   --   --   --   --   --  235.1*  --   --   --   --   --   --   --   --   --   --   --   --   --   --   PHART  --   --   --   --   --   --   --  7.295*  --   --   --   --   --   --   --   --   --   --   --   --   --   PCO2ART  --   --   --   --   --   --   --  30.8*  --   --   --   --   --   --   --   --   --   --   --   --   --   PO2ART  --   --   --   --   --   --   --  78.0*  --   --   --   --   --   --   --   --   --   --   --   --   --   < > = values in this interval not displayed. No results found for this basename: GLUCAP,  in the last 168 hours  CXR 5/15 >>  IMPRESSION:  Bibasilar opacification likely small effusions with associated atelectasis, although cannot exclude infection in the lung bases.  Right IJ central venous catheter unchanged.    ASSESSMENT / PLAN:  PULMONARY A: Resp distress, resolved P:   - Titrate O2 as needed. - IS and flutter valve.   CARDIOVASCULAR A: HTN and a-fib/flutter, hx SVT s/p cardioversion.  Hemorrhagic  shock from intraabdominal bleed, stabilized P:  - coreg reversed, glucagon off 5/13; metoprolol IV started 5/15, transition back to coreg once stable - Volume resuscitation. - See H/O section. - Dr Mora Bellman al following, ? Whether any other intervention needs to be made since embolization was not possible. Following at this time   RENAL A:  Acute renal insufficiency, progressive, oliguric >> improving Lactic acidosis, resolved P:   - Monitor I/O closely. - Monitor renal function and intraabdominal pressure.  - restart his uroxatral 5/15   GASTROINTESTINAL A:  Intraabdominal hemorrhage.   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 P:   - Pain control, but careful w narcs    HEMATOLOGIC A:  Hemorrhagic shock, coagulopathic and massive transfusion, resolved P:  - follow CBC q12h, and INR intermittently   INFECTIOUS A:  No active issues. P:   - Monitor fever curve and WBC.   ENDOCRINE A:  No history of diabetes.   P:   - follow CBG    NEUROLOGIC A:  No active issues. P:   - Monitor neuro status with narcotics.   OPTHO: P: - xalatan for his glaucoma   GLOBAL: P: - transferred to SDU bed 5/16 - d/c cordis     Vickie Ponds M. Kriste Basque, MD  08/09/2012, 10:27 AM [facility]

## 2012-08-09 NOTE — Progress Notes (Signed)
VASCULAR PROGRESS NOTE  SUBJECTIVE: No change in abdominal pain. No better, no worse. No BM. + Flatus.   PHYSICAL EXAM: Filed Vitals:   08/08/12 1539 08/08/12 1955 08/08/12 2335 08/09/12 0400  BP:  118/64 106/69 136/76  Pulse:  84 79 82  Temp: 98.1 F (36.7 C) 98.7 F (37.1 C) 98.4 F (36.9 C) 98.1 F (36.7 C)  TempSrc: Oral Oral Oral Oral  Resp:  24 12 18   Height:      Weight:    246 lb 7.6 oz (111.8 kg)  SpO2:  97% 95% 98%   Abdomen: minimal tenderness. Has bowel sounds.   LABS: Lab Results  Component Value Date   WBC 9.4 08/09/2012   HGB 7.6* 08/09/2012   HCT 22.5* 08/09/2012   MCV 86.5 08/09/2012   PLT 115* 08/09/2012   Lab Results  Component Value Date   CREATININE 1.21 08/07/2012   Lab Results  Component Value Date   INR 1.20 08/06/2012   Principal Problem:   Acute GI bleeding, spontaneous  Active Problems:   Hemorrhagic shock- now stable   Coagulopathy- chronic anticoagulation with Xarelto   Bradycardia- HR 58 NSR on admission 08/04/12   Atrial flutter, paroxysmal- noted 11/13. S/P TEE CV Nov 2013. 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 Hgb is stable (7.6 today, 7.6 yest at 4 PM) Holding off on anticoagulation and antiplatelet agents.   * No plans from our standpoint unless he showed evidence of active bleeding.   Cari Caraway Beeper: [phone] 08/09/2012

## 2012-08-09 NOTE — Progress Notes (Addendum)
[facility]  DAILY PROGRESS NOTE   Subjective:  No events overnight. Nausea is improved. HGB is stable. Telemetry shows sinus rhythm with PAC's and PVC's.   Objective:  Temp:  [97.9 F (36.6 C)-98.7 F (37.1 C)] 98.1 F (36.7 C) (05/18 0400) Pulse Rate:  [79-84] 84 (05/18 0750) Resp:  [12-24] 18 (05/18 0750) BP: (102-136)/(59-76) 134/71 mmHg (05/18 0750) SpO2:  [95 %-98 %] 96 % (05/18 0750) Weight:  [246 lb 7.6 oz (111.8 kg)] 246 lb 7.6 oz (111.8 kg) (05/18 0400) Weight change: 7.1 oz (0.2 kg)  Intake/Output from previous day: 05/17 0701 - 05/18 0700 In: 220 [P.O.:220] Out: 900 [Urine:900]  Intake/Output from this shift:    Medications: Current Facility-Administered Medications  Medication Dose Route Frequency Provider Last Rate Last Dose  . acetaminophen (TYLENOL) tablet 650 mg  650 mg Oral Q4H PRN [name], MD      . albuterol (PROVENTIL) (5 MG/ML) 0.5% nebulizer solution 2.5 mg  2.5 mg Nebulization Q4H PRN Lonia Farber, MD   2.5 mg at 08/07/12 2140  . alfuzosin (UROXATRAL) 24 hr tablet 10 mg  10 mg Oral Daily Leslye Peer, MD   10 mg at 08/08/12 2145  . ALPRAZolam (XANAX) tablet 0.25 mg  0.25 mg Oral TID PRN Thurmon Fair, MD   0.25 mg at 08/08/12 1313  . alum & mag hydroxide-simeth (MAALOX/MYLANTA) 200-200-20 MG/5ML suspension 30 mL  30 mL Oral Q4H PRN [name], MD   30 mL at 08/06/12 1324  . bacitracin-polymyxin b (POLYSPORIN) ophthalmic ointment   Left Eye BID Leslye Peer, MD      . docusate sodium (COLACE) capsule 100 mg  100 mg Oral BID Sherrie George, PA-C   100 mg at 08/08/12 2145  . fentaNYL (SUBLIMAZE) injection 25-100 mcg  25-100 mcg Intravenous Q1H PRN Alyson Reedy, MD   100 mcg at 08/08/12 1359  . latanoprost (XALATAN) 0.005 % ophthalmic solution 1 drop  1 drop Both Eyes QHS Leslye Peer, MD   1 drop at 08/08/12 2145  . LORazepam (ATIVAN) tablet 0.5 mg  0.5 mg Oral Q6H PRN Michele Mcalpine, MD    0.5 mg at 08/08/12 1939  . metoprolol tartrate (LOPRESSOR) tablet 12.5 mg  12.5 mg Oral BID Brittainy Simmons, PA-C   12.5 mg at 08/08/12 2145  . ondansetron (ZOFRAN) injection 4 mg  4 mg Intravenous Q6H PRN Storm Frisk, MD   4 mg at 08/05/12 2030  . oxyCODONE (Oxy IR/ROXICODONE) immediate release tablet 5 mg  5 mg Oral Q4H PRN Michele Mcalpine, MD   5 mg at 08/08/12 1739  . promethazine (PHENERGAN) injection 12.5 mg  12.5 mg Intravenous Q4H PRN Storm Frisk, MD   12.5 mg at 08/05/12 2240  . senna (SENOKOT) tablet 8.6 mg  1 tablet Oral QHS PRN Sherrie George, PA-C        Physical Exam: General appearance: alert and no distress Neck: no adenopathy, no carotid bruit, no JVD, supple, symmetrical, trachea midline and thyroid not enlarged, symmetric, no tenderness/mass/nodules Lungs: clear to auscultation bilaterally Heart: regular rate and rhythm, S1, S2 normal, no murmur, click, rub or gallop Abdomen: firm in the right lower quadrant Extremities: extremities normal, atraumatic, no cyanosis or edema Pulses: 2+ and symmetric Skin: pale, warm, dry Neurologic: Grossly normal  Lab Results: Results for orders placed during the hospital encounter of 08/04/12 (from the past 48 hour(s))  CBC     Status:  Abnormal   Collection Time    08/07/12  4:50 PM      Result Value Range   WBC 10.9 (*) 4.0 - 10.5 K/uL   RBC 2.82 (*) 4.22 - 5.81 MIL/uL   Hemoglobin 8.1 (*) 13.0 - 17.0 g/dL   HCT 62.9 (*) 52.8 - 41.3 %   MCV 84.0  78.0 - 100.0 fL   MCH 28.7  26.0 - 34.0 pg   MCHC 34.2  30.0 - 36.0 g/dL   RDW 24.4 (*) 01.0 - 27.2 %   Platelets 122 (*) 150 - 400 K/uL  CBC     Status: Abnormal   Collection Time    08/08/12  4:02 AM      Result Value Range   WBC 10.1  4.0 - 10.5 K/uL   RBC 2.71 (*) 4.22 - 5.81 MIL/uL   Hemoglobin 7.9 (*) 13.0 - 17.0 g/dL   HCT 53.6 (*) 64.4 - 03.4 %   MCV 85.2  78.0 - 100.0 fL   MCH 29.2  26.0 - 34.0 pg   MCHC 34.2  30.0 - 36.0 g/dL   RDW 74.2 (*) 59.5 - 63.8 %    Platelets 119 (*) 150 - 400 K/uL   Comment: REPEATED TO VERIFY     SPECIMEN CHECKED FOR CLOTS     PLATELET COUNT CONFIRMED BY SMEAR  CBC     Status: Abnormal   Collection Time    08/08/12  4:35 PM      Result Value Range   WBC 9.4  4.0 - 10.5 K/uL   RBC 2.58 (*) 4.22 - 5.81 MIL/uL   Hemoglobin 7.6 (*) 13.0 - 17.0 g/dL   HCT 75.6 (*) 43.3 - 29.5 %   MCV 86.8  78.0 - 100.0 fL   MCH 29.5  26.0 - 34.0 pg   MCHC 33.9  30.0 - 36.0 g/dL   RDW 18.8 (*) 41.6 - 60.6 %   Platelets 115 (*) 150 - 400 K/uL   Comment: CONSISTENT WITH PREVIOUS RESULT  CBC     Status: Abnormal   Collection Time    08/09/12  4:00 AM      Result Value Range   WBC 9.4  4.0 - 10.5 K/uL   RBC 2.60 (*) 4.22 - 5.81 MIL/uL   Hemoglobin 7.6 (*) 13.0 - 17.0 g/dL   HCT 30.1 (*) 60.1 - 09.3 %   MCV 86.5  78.0 - 100.0 fL   MCH 29.2  26.0 - 34.0 pg   MCHC 33.8  30.0 - 36.0 g/dL   RDW 23.5  57.3 - 22.0 %   Platelets 115 (*) 150 - 400 K/uL   Comment: CONSISTENT WITH PREVIOUS RESULT    Imaging: No results found.  Assessment:  1. Principal Problem: 2.   Acute GI bleeding, spontaneous  3. Active Problems: 4.   Hemorrhagic shock- now stable 5.   Coagulopathy- chronic anticoagulation with Xarelto 6.   Bradycardia- HR 58 NSR on admission 08/04/12 7.   Atrial flutter, paroxysmal- noted 11/13. S/P TEE CV Nov 2013. Short runs of breakthough PAF since adm 8.   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/ring by Dr Silvestre Mesi at [facility] '02 9.   Normal coronary arteries at cath 2006 (false positive Nuc) 10.   PVD, < 49% carotid, 50% RSCA 11.   Contrast media allergy 12.   Acute renal insufficiency 13.   NSVT (nonsustained ventricular tachycardia) 14.   Plan:  1. He is slowly improving and appears  to have stabilized from a bleeding standpoint. He is maintaining sinus rhythm with PAC's and PVC's.  Blood pressure has recovered and HR is in the 80's. I think we can increase his b-blocker today to 25 mg po BID.  May wish to  add iron due to marked blood loss to help replete iron stores.  ?transfer to floor today.  Time Spent Directly with Patient:  15 minutes  Length of Stay:  LOS: 5 days   [name], MD, [facility] Attending Cardiologist [facility]  HILTY,Kenneth C 08/09/2012, 8:46 AM

## 2012-08-09 NOTE — Progress Notes (Signed)
  Subjective: Complains of abdominal pain but definitely no worse than it has been. No nausea and tolerating full liquids. Passing flatus  Objective: Vital signs in last 24 hours: Temp:  [97.9 F (36.6 C)-98.7 F (37.1 C)] 98.1 F (36.7 C) (05/18 0400) Pulse Rate:  [79-84] 84 (05/18 0750) Resp:  [12-24] 18 (05/18 0750) BP: (102-136)/(59-76) 134/71 mmHg (05/18 0750) SpO2:  [95 %-98 %] 96 % (05/18 0750) Weight:  [246 lb 7.6 oz (111.8 kg)] 246 lb 7.6 oz (111.8 kg) (05/18 0400) Last BM Date: 08/03/12  Intake/Output from previous day: 05/17 0701 - 05/18 0700 In: 220 [P.O.:220] Out: 900 [Urine:900] Intake/Output this shift:    GI: soft but tendern mostly on right. good bowel sounds  Lab Results:   Recent Labs  08/08/12 1635 08/09/12 0400  WBC 9.4 9.4  HGB 7.6* 7.6*  HCT 22.4* 22.5*  PLT 115* 115*   BMET  Recent Labs  08/06/12 1600 08/07/12 0400  NA 143 141  K 4.4 4.1  CL 109 107  CO2 26 24  GLUCOSE 122* 105*  BUN 42* 36*  CREATININE 1.40* 1.21  CALCIUM 8.2* 8.0*   PT/INR No results found for this basename: LABPROT, INR,  in the last 72 hours ABG No results found for this basename: PHART, PCO2, PO2, HCO3,  in the last 72 hours  Studies/Results: No results found.  Anti-infectives: Anti-infectives   None      Assessment/Plan: s/p * No surgery found * continue fulls Continue to monitor in stepdown with serial hg  LOS: 5 days    TOTH III,Karma Hiney S 08/09/2012

## 2012-08-10 ENCOUNTER — Inpatient Hospital Stay (HOSPITAL_COMMUNITY): Payer: Medicare Other

## 2012-08-10 DIAGNOSIS — K922 Gastrointestinal hemorrhage, unspecified: Secondary | ICD-10-CM | POA: Diagnosis not present

## 2012-08-10 DIAGNOSIS — N289 Disorder of kidney and ureter, unspecified: Secondary | ICD-10-CM | POA: Diagnosis not present

## 2012-08-10 DIAGNOSIS — R109 Unspecified abdominal pain: Secondary | ICD-10-CM | POA: Diagnosis not present

## 2012-08-10 DIAGNOSIS — I4892 Unspecified atrial flutter: Secondary | ICD-10-CM

## 2012-08-10 DIAGNOSIS — D689 Coagulation defect, unspecified: Secondary | ICD-10-CM | POA: Diagnosis not present

## 2012-08-10 DIAGNOSIS — Z4682 Encounter for fitting and adjustment of non-vascular catheter: Secondary | ICD-10-CM | POA: Diagnosis not present

## 2012-08-10 DIAGNOSIS — R58 Hemorrhage, not elsewhere classified: Secondary | ICD-10-CM

## 2012-08-10 DIAGNOSIS — D62 Acute posthemorrhagic anemia: Secondary | ICD-10-CM | POA: Diagnosis not present

## 2012-08-10 DIAGNOSIS — T794XXA Traumatic shock, initial encounter: Secondary | ICD-10-CM | POA: Diagnosis not present

## 2012-08-10 DIAGNOSIS — R918 Other nonspecific abnormal finding of lung field: Secondary | ICD-10-CM | POA: Diagnosis not present

## 2012-08-10 LAB — COMPREHENSIVE METABOLIC PANEL
Alkaline Phosphatase: 63 U/L (ref 39–117)
BUN: 20 mg/dL (ref 6–23)
Calcium: 8.8 mg/dL (ref 8.4–10.5)
Creatinine, Ser: 0.87 mg/dL (ref 0.50–1.35)
GFR calc Af Amer: >90 mL/min (ref >90)
Glucose, Bld: 107 mg/dL — ABNORMAL HIGH (ref 70–99)
Potassium: 4 mEq/L (ref 3.5–5.1)
Total Protein: 6.4 g/dL (ref 6.0–8.3)

## 2012-08-10 LAB — CBC
HCT: 24.7 % — ABNORMAL LOW (ref 39.0–52.0)
RDW: 15.2 % (ref 11.5–15.5)
WBC: 9.4 10*3/uL (ref 4.0–10.5)

## 2012-08-10 NOTE — Progress Notes (Signed)
[facility]  Subjective: Up out of bed and in chair. Still feels fatigued but feels that he is improving. Continues to have some abdominal discomfort, but also much improved.   Objective: Vital signs in last 24 hours: Temp:  [97.7 F (36.5 C)-99.5 F (37.5 C)] 98 F (36.7 C) (05/19 1137) Pulse Rate:  [77-94] 82 (05/19 1138) Resp:  [15-30] 23 (05/19 1138) BP: (113-138)/(65-94) 113/71 mmHg (05/19 1138) SpO2:  [94 %-99 %] 99 % (05/19 0405) Weight:  [240 lb 1.3 oz (108.9 kg)] 240 lb 1.3 oz (108.9 kg) (05/19 0758) Last BM Date: 08/09/12  Intake/Output from previous day: 05/18 0701 - 05/19 0700 In: 480 [P.O.:480] Out: 850 [Urine:850] Intake/Output this shift:    Medications Current Facility-Administered Medications  Medication Dose Route Frequency Provider Last Rate Last Dose  . acetaminophen (TYLENOL) tablet 650 mg  650 mg Oral Q4H PRN [name], MD   650 mg at 08/10/12 1019  . albuterol (PROVENTIL) (5 MG/ML) 0.5% nebulizer solution 2.5 mg  2.5 mg Nebulization Q4H PRN Lonia Farber, MD   2.5 mg at 08/07/12 2140  . alfuzosin (UROXATRAL) 24 hr tablet 10 mg  10 mg Oral Daily Leslye Peer, MD   10 mg at 08/09/12 2123  . ALPRAZolam (XANAX) tablet 0.25 mg  0.25 mg Oral TID PRN Thurmon Fair, MD   0.25 mg at 08/09/12 1604  . alum & mag hydroxide-simeth (MAALOX/MYLANTA) 200-200-20 MG/5ML suspension 30 mL  30 mL Oral Q4H PRN [name], MD   30 mL at 08/06/12 1324  . bacitracin-polymyxin b (POLYSPORIN) ophthalmic ointment   Left Eye BID Leslye Peer, MD      . bisacodyl (DULCOLAX) suppository 10 mg  10 mg Rectal Daily PRN Michele Mcalpine, MD   10 mg at 08/09/12 1604  . docusate sodium (COLACE) capsule 100 mg  100 mg Oral BID Sherrie George, PA-C   100 mg at 08/09/12 1022  . fentaNYL (SUBLIMAZE) injection 25-100 mcg  25-100 mcg Intravenous Q1H PRN Alyson Reedy, MD   100 mcg at 08/08/12 1359  . latanoprost (XALATAN) 0.005 %  ophthalmic solution 1 drop  1 drop Both Eyes QHS Leslye Peer, MD   1 drop at 08/09/12 2123  . LORazepam (ATIVAN) tablet 0.5 mg  0.5 mg Oral Q6H PRN Michele Mcalpine, MD   0.5 mg at 08/08/12 1939  . magnesium hydroxide (MILK OF MAGNESIA) suspension 30 mL  30 mL Oral BID PRN Michele Mcalpine, MD   30 mL at 08/09/12 1722  . metoprolol tartrate (LOPRESSOR) tablet 25 mg  25 mg Oral BID [name], MD   25 mg at 08/10/12 1015  . ondansetron (ZOFRAN) injection 4 mg  4 mg Intravenous Q6H PRN Storm Frisk, MD   4 mg at 08/05/12 2030  . oxyCODONE (Oxy IR/ROXICODONE) immediate release tablet 5 mg  5 mg Oral Q4H PRN Michele Mcalpine, MD   5 mg at 08/09/12 2250  . promethazine (PHENERGAN) injection 12.5 mg  12.5 mg Intravenous Q4H PRN Storm Frisk, MD   12.5 mg at 08/05/12 2240  . senna (SENOKOT) tablet 8.6 mg  1 tablet Oral QHS PRN Sherrie George, PA-C        PE: General appearance: alert, cooperative and no distress Lungs: clear to auscultation bilaterally Heart: regular rate and rhythm Extremities: no LEE Pulses: 2+ and symmetric Skin: warm and dry Neurologic: Grossly normal  Lab Results:   Recent  Labs  08/08/12 1635 08/09/12 0400 08/10/12 0544  WBC 9.4 9.4 9.4  HGB 7.6* 7.6* 8.4*  HCT 22.4* 22.5* 24.7*  PLT 115* 115* 147*   BMET  Recent Labs  08/10/12 0544  NA 135  K 4.0  CL 98  CO2 25  GLUCOSE 107*  BUN 20  CREATININE 0.87  CALCIUM 8.8   Assessment/Plan  Principal Problem:   Acute GI bleeding, spontaneous  Active Problems:   Hemorrhagic shock- now stable   Coagulopathy- chronic anticoagulation with Xarelto   Bradycardia- HR 58 NSR on admission 08/04/12   Atrial flutter, paroxysmal- noted 11/13. S/P TEE CV Nov 2013. 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 He continues to have PVCs. Lopressor was increased yesterday to 25 mg BID. HR in the 70s-80s. BP is stable with SBP in the 110s. May need to increase BB to 37.5, but would be hesitant due to BP and potential increased fatigue. MD to follow with further recommendation.    LOS: 6 days    Brittainy M. Delmer Islam 08/10/2012 12:11 PM  I have seen and examined the patient along with Brittainy M. Sharol Harness, PA-C.  I have reviewed the chart, notes and new data.  I agree with PA's note.  Key new complaints: feels much better, good appetite, no pain Key examination changes: mild lower extremity swelling; subtle jaundice Key new findings / data: one 7-beat run of atrial tachycardia. I think most (if not all) of his wide complex beats are actually aberrantly conducted PACs.  PLAN: I would not increase beta blocker dose for now - he needs a little faster heart rate to compensate for moderate anemia. 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 Together with reabsorbption of hematoma and recent transfusion, this explains elevated bili with normal LFTs. Will arrange early follow up after discharge. Again recommend to avoid both anticoagulants and antiplatelets for the time being.   Thurmon Fair, MD, [facility] [phone] 08/10/2012, 4:32 PM

## 2012-08-10 NOTE — Progress Notes (Signed)
Patient ID: Sean Stanley, male   DOB: 11-16-1940, 72 y.o.   MRN: [id number]    Subjective: Pt reports pain still present but overall improving, denies n/v, tol diet well, +flatus and BMs  Objective: Vital signs in last 24 hours: Temp:  [97.7 F (36.5 C)-99.5 F (37.5 C)] 97.7 F (36.5 C) (05/19 0758) Pulse Rate:  [77-94] 90 (05/19 0705) Resp:  [15-30] 30 (05/19 0705) BP: (116-138)/(65-94) 116/65 mmHg (05/19 0705) SpO2:  [94 %-99 %] 99 % (05/19 0405) Weight:  [240 lb 1.3 oz (108.9 kg)] 240 lb 1.3 oz (108.9 kg) (05/19 0758) Last BM Date: 08/09/12  Intake/Output from previous day: 05/18 0701 - 05/19 0700 In: 480 [P.O.:480] Out: 850 [Urine:850] Intake/Output this shift:    GI: overall soft, tender along right flank and RUQ, +BS, no guarding or peritonitis.  Lab Results:   Recent Labs  08/09/12 0400 08/10/12 0544  WBC 9.4 9.4  HGB 7.6* 8.4*  HCT 22.5* 24.7*  PLT 115* 147*   BMET  Recent Labs  08/10/12 0544  NA 135  K 4.0  CL 98  CO2 25  GLUCOSE 107*  BUN 20  CREATININE 0.87  CALCIUM 8.8   PT/INR No results found for this basename: LABPROT, INR,  in the last 72 hours ABG No results found for this basename: PHART, PCO2, PO2, HCO3,  in the last 72 hours  Studies/Results: Dg Chest Port 1 View  08/10/2012   *RADIOLOGY REPORT*  Clinical Data: Pneumonia.  PORTABLE CHEST - 1 VIEW  Comparison: 08/06/2012.  Findings: The right IJ catheter has been removed.  Persistent low lung volumes with vascular crowding, bibasilar atelectasis and/or infiltrates and small effusions.  IMPRESSION:  1.  Removal of right IJ catheter. 2.  Persistent low lung volumes with small bilateral pleural effusions, bibasilar atelectasis and/or infiltrates.   Original Report Authenticated By: Rudie Meyer, M.D.    Anti-infectives: Anti-infectives   None      Assessment/Plan: Large intraperitoneal bleed; pseudoaneurysm off of a pancreatic branch with no active bleeding.  Coagulopathy- chronic  anticoagulation with Xarelto  Atrial flutter, paroxysmal- noted 11/13. S/P TEE CV Nov 2013. Short runs of breakthough PAF since adm  Hemorrhagic shock- now stable  Acute renal failure due to shock.  Hypertension  Hypercholesteremia  Mitral valve disease  annuloplasty 2002 [facility]  Carotid stenosis  03/10/2007 right & left bulbs & ICAs 0-49% reduction ,right subclavian 50% reduction   Plan:  Hgb has been stable, tolerating diet, no signs of bowel obstruction, no indications at this time for general surgery intervention, will sign off at this time, call with questions/concerns.   LOS: 6 days    Ramone Gander 08/10/2012

## 2012-08-10 NOTE — Progress Notes (Signed)
Utilization review completed.  

## 2012-08-10 NOTE — Progress Notes (Signed)
Agree with above, tolerating diet, will follow up with vascular for repeat ct

## 2012-08-10 NOTE — Progress Notes (Signed)
PULMONARY  / CRITICAL CARE MEDICINE  Name: Sean Stanley MRN: [id number] DOB: Jul 02, 1940    ADMISSION DATE:  08/04/2012 CONSULTATION DATE:  08/04/12  REFERRING MD :  EDP PRIMARY SERVICE: PCCM  CHIEF COMPLAINT:  Abdominal Pain  BRIEF PATIENT DESCRIPTION: 72 year old male with PMH of atrial flutter on xeralto who [facility] [place] [facility] after a day of abdominal pain, N/V and fever.  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  Patient was transferred to [facility] for vascular surgery to evaluate.  In the ED the patient was noted to remain bradycardic and BP was very fluctuant.  Patient was in severe pain upon evaluation and history was obtained from family.  SIGNIFICANT EVENTS / STUDIES:  5/13>>>Intrabdominal bleeding. 5/13 CT-angio abd >> large hematoma, pseudoaneurysm of pancreaticoduodenal branch without active bleeding 5/14 >> Went for possible embolization, unable to access the culprit vessel  5/17-18> followed by [facility], felt to be stable w/ severe dis as noted  LINES / TUBES: R IJ Cordis 5/13>>> 5/16  CULTURES: None  ANTIBIOTICS: None  SUBJECTIVE:  Feels better, less abd pain, up to chair, tolerating light diet Received additional 2u PRBC 5/15  VITAL SIGNS: Temp:  [97.7 F (36.5 C)-99.5 F (37.5 C)] 97.7 F (36.5 C) (05/19 0758) Pulse Rate:  [77-94] 90 (05/19 0705) Resp:  [15-30] 30 (05/19 0705) BP: (116-138)/(65-94) 116/65 mmHg (05/19 0705) SpO2:  [94 %-99 %] 99 % (05/19 0405) Weight:  [108.9 kg (240 lb 1.3 oz)] 108.9 kg (240 lb 1.3 oz) (05/19 0758) HEMODYNAMICS:   VENTILATOR SETTINGS:   INTAKE / OUTPUT: Intake/Output     05/18 0701 - 05/19 0700 05/19 0701 - 05/20 0700   P.O. 480    Total Intake(mL/kg) 480 (4.3)    Urine (mL/kg/hr) 850 (0.3)    Total Output 850     Net -370          Urine Occurrence 5 x    Stool Occurrence 3 x     PHYSICAL EXAMINATION: General:  Well appearing male, no distress, up to chair Neuro:  Alert and  interactive, moving all ext to command. HEENT:  [place]/AT, PERRL, EOM-I and -thyromegally/LAN. Cardiovascular:  RRR, Nl S1/S2, -M/R/G. Lungs:  CTA bilaterally. Abdomen:  Soft, NT, ND and +BS. Musculoskeletal:  -edema and -tenderness. Skin:  normal  LABS:  Recent Labs Lab 08/04/12 1519 08/04/12 1541  08/04/12 1701 08/04/12 1702 08/04/12 1720  08/05/12 0004 08/05/12 0007  08/05/12 1000  08/06/12 0400 08/06/12 1600 08/07/12 0400  08/08/12 1635 08/09/12 0400 08/10/12 0544  HGB 10.6*  --   --  11.8*  --   --   < > 7.6*  --   < > 7.7*  < > 8.4* 7.2* 8.0*  < > 7.6* 7.6* 8.4*  WBC 15.6*  --   --  14.6*  --   --   < > 10.5  --   < > 13.1*  < > 17.7* 15.2* 12.6*  < > 9.4 9.4 9.4  PLT 173  --   --  144*  --   --   < > 132*  --   < > 143*  < > 156 140* 123*  < > 115* 115* 147*  NA 137  --   --  137  --   --   --  138  --   --  140  --  141 143 141  --   --   --  135  K 3.6  --   --  4.2  --   --   --  4.2  --   --  4.6  --  4.7 4.4 4.1  --   --   --  4.0  CL 106  --   --  104  --   --   --  106  --   --  107  --  107 109 107  --   --   --  98  CO2 18*  --   --  20  --   --   --  23  --   --  25  --  23 26 24   --   --   --  25  GLUCOSE 192*  --   --  248*  --   --   --  148*  --   --  150*  --  148* 122* 105*  --   --   --  107*  BUN 22  --   --  22  --   --   --  28*  --   --  31*  --  46* 42* 36*  --   --   --  20  CREATININE 1.04  --   --  0.98  --   --   --  1.29  --   --  1.30  --  1.78* 1.40* 1.21  --   --   --  0.87  CALCIUM 7.6*  --   --  7.2*  --   --   --  7.4*  --   --  8.0*  --  8.2* 8.2* 8.0*  --   --   --  8.8  MG  --   --   < > 1.5  --   --   --   --   --   --  1.8  --  2.0  --  2.2  --   --   --   --   PHOS  --   --   < > 4.1  --   --   --   --   --   --  3.4  --  4.3  --  3.1  --   --   --   --   AST 13  --   --  12  --   --   --   --   --   --   --   --   --   --   --   --   --   --  29  ALT 9  --   --  9  --   --   --   --   --   --   --   --   --   --   --   --   --   --   74*  ALKPHOS 44  --   --  41  --   --   --   --   --   --   --   --   --   --   --   --   --   --  63  BILITOT 1.6*  --   --  3.0*  --   --   --   --   --   --   --   --   --   --   --   --   --   --  3.3*  PROT 4.6*  --   --  4.1*  --   --   --   --   --   --   --   --   --   --   --   --   --   --  6.4  ALBUMIN 2.4*  --   --  2.3*  --   --   --   --   --   --   --   --   --   --   --   --   --   --  3.2*  APTT 35  --   --  30  --   --   --  33  --   --  31  --   --   --   --   --   --   --   --   INR 1.78*  --   --  1.09  --   --   --  1.33  --   --  1.40  --  1.20  --   --   --   --   --   --   LATICACIDVEN  --  4.34*  --   --  7.1*  --   --   --  2.5*  --   --   --   --   --   --   --   --   --   --   PROBNP  --   --   --   --  235.1*  --   --   --   --   --   --   --   --   --   --   --   --   --  1156.0*  PHART  --   --   --   --   --  7.295*  --   --   --   --   --   --   --   --   --   --   --   --   --   PCO2ART  --   --   --   --   --  30.8*  --   --   --   --   --   --   --   --   --   --   --   --   --   PO2ART  --   --   --   --   --  78.0*  --   --   --   --   --   --   --   --   --   --   --   --   --   < > = values in this interval not displayed. No results found for this basename: GLUCAP,  in the last 168 hours  CXR 5/15 >>  IMPRESSION:  Bibasilar opacification likely small effusions with associated atelectasis, although cannot exclude infection in the lung bases.  Right IJ central venous catheter unchanged.    ASSESSMENT / PLAN:  PULMONARY A: Resp distress, resolved P:   - Titrate O2 as needed. - IS and flutter valve. - Pulmonary hygienes.  CARDIOVASCULAR A: HTN and a-fib/flutter, hx SVT s/p cardioversion.  Hemorrhagic shock from intraabdominal bleed, stabilized P:  - Coreg reversed, glucagon off 5/13; metoprolol IV started 5/15 and now on PO  metoprolol and well tolerated, will continue. - Volume resuscitation complete, will KVO IVF. - See H/O section. - Dr  Mora Bellman al following, ? Whether any other intervention needs to be made since embolization was not possible. Following at this time  RENAL A:  Acute renal insufficiency, progressive, oliguric >> improving Lactic acidosis, resolved P:   - Monitor I/O closely. - Monitor renal function and intraabdominal pressure.  - Continue uroxatral 5/15 for BPH.  GASTROINTESTINAL A:  Intraabdominal hemorrhage.   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 P:   - Pain control, but careful w narcs. - Diet as tolerated.  HEMATOLOGIC A:  Hemorrhagic shock, coagulopathic and massive transfusion, resolved P:  - Follow CBC q12h, and INR intermittently, will defer anticoagulation recommendations for vascular.  INFECTIOUS A:  No active issues. P:   - Monitor fever curve and WBC.  ENDOCRINE A:  No history of diabetes.   P:   - Follow CBG   NEUROLOGIC A:  No active issues. P:   - Monitor neuro status with narcotics.   OPTHO: P: - Xalatan for his glaucoma  GLOBAL: P: - Transferred to SDU bed 5/16, will hold there likely til AM then if remains stable will D/C home in AM. - D/C cordis   Alyson Reedy, M.D. [facility] Pulmonary/Critical Care Medicine. Pager: [phone]. After hours pager: [phone].

## 2012-08-10 NOTE — Progress Notes (Signed)
[facility] [place]  Subjective  -   Continues to improve. Tolerating full diet. Is not interested in taking regular food. Has been able to angulate. Concern about pain in the right mid quadrant.   Physical Exam:  Abdomen soft with right-sided tenderness Cardiovascular: Regular rate and rhythm Pulmonary: Nonlabored respirations       Assessment/Plan:    The patient has had a relatively uneventful weekend. First time, his hematocrit has started to trend upward. His creatinine is also back to normal. I told the patient that my perspective he could potentially go home today versus tomorrow. I told him to decrease his strenuous activity for the next 4-6 weeks. I discussed coming back to see me in the office in 4-6 weeks with a CT angiogram of the abdomen and pelvis. He understands that he may require intervention on the pseudoaneurysm in the future.  Rabecka Brendel IV, V. WELLS 08/10/2012 9:42 AM --  Filed Vitals:   08/10/12 0758  BP:   Pulse:   Temp: 97.7 F (36.5 C)  Resp:     Intake/Output Summary (Last 24 hours) at 08/10/12 0942 Last data filed at 08/10/12 0400  Gross per 24 hour  Intake    240 ml  Output    700 ml  Net   -460 ml     Laboratory CBC    Component Value Date/Time   WBC 9.4 08/10/2012 0544   HGB 8.4* 08/10/2012 0544   HCT 24.7* 08/10/2012 0544   PLT 147* 08/10/2012 0544    BMET    Component Value Date/Time   NA 135 08/10/2012 0544   K 4.0 08/10/2012 0544   CL 98 08/10/2012 0544   CO2 25 08/10/2012 0544   GLUCOSE 107* 08/10/2012 0544   BUN 20 08/10/2012 0544   CREATININE 0.87 08/10/2012 0544   CALCIUM 8.8 08/10/2012 0544   GFRNONAA 85* 08/10/2012 0544   GFRAA >90 08/10/2012 0544    COAG Lab Results  Component Value Date   INR 1.20 08/06/2012   INR 1.40 08/05/2012   INR 1.33 08/05/2012   No results found for this basename: PTT    Antibiotics Anti-infectives   None       V. Charlena Cross, M.D. [facility] [place] Office: [phone] Pager:  [phone]

## 2012-08-10 NOTE — Care Management Note (Signed)
    Page 1 of 1   08/10/2012     4:32:41 PM   CARE MANAGEMENT NOTE 08/10/2012  Patient:  Sean Stanley, Sean Stanley   Account Number:  [id number]  Date Initiated:  08/05/2012  Documentation initiated by:  [facility]  Subjective/Objective Assessment:   abd bleed.  Lives with wife who is retired Engineer, civil (consulting).     Action/Plan:   PT/OT evals   Anticipated DC Date:  08/11/2012   Anticipated DC Plan:  HOME W HOME HEALTH SERVICES      DC Planning Services  CM consult      Choice offered to / List presented to:             Status of service:  In process, will continue to follow Medicare Important Message given?   (If response is "NO", the following Medicare IM given date fields will be blank) Date Medicare IM given:   Date Additional Medicare IM given:    Discharge Disposition:    Per UR Regulation:  Reviewed for med. necessity/level of care/duration of stay  If discussed at Long Length of Stay Meetings, dates discussed:    Comments:  08/10/12- 1630- Renard [place]per RN, BSN [phone] Pt for repeat CT today, possible home in AM if stable- tolerating diet- PT eval recommending HH-PT- no DME recommendations- NCM to f/u with [facility] arrangements prior to discharge. Will need order for HH-PT per MD with F2F.

## 2012-08-10 NOTE — Progress Notes (Signed)
Physical Therapy Treatment Patient Details Name: Sean Stanley MRN: [id number] DOB: Mar 06, 1941 Today's Date: 08/10/2012 Time: 7829-5621 PT Time Calculation (min): 24 min  PT Assessment / Plan / Recommendation Comments on Treatment Session  Patient demonstrates improved activity tolerance ambulating without O2 and without assistive device.  Still likely fall risk due to difficulty staying on straight path with head turns, but encouraged to walk again with nursing assist without device.    Follow Up Recommendations  Home health PT;Supervision - Intermittent           Equipment Recommendations  None recommended by PT    Recommendations for Other Services    Frequency Min 3X/week   Plan Discharge plan remains appropriate    Precautions / Restrictions Precautions Precautions: Fall   Pertinent Vitals/Pain Min c/o abdominal pain with movement    Mobility  Bed Mobility Bed Mobility: Not assessed Details for Bed Mobility Assistance: sitting in recliner Transfers Sit to Stand: 5: Supervision;From chair/3-in-1 Stand to Sit: To chair/3-in-1;6: Modified independent (Device/Increase time) Details for Transfer Assistance: supervision for safety due to not using walker today Ambulation/Gait Ambulation/Gait Assistance: 4: Min guard;5: Supervision Ambulation Distance (Feet): 400 Feet Assistive device: None Ambulation/Gait Assistance Details: cues to stay within 2 squares of tile while walking due to veers from straight path with head turns.  Cues to rest and for pursed lip breathing due to desat to 87% on room air Gait Pattern: Step-through pattern;Decreased stride length;Wide base of support      PT Goals Acute Rehab PT Goals Pt will go Sit to Stand: Independently PT Goal: Sit to Stand - Progress: Progressing toward goal Pt will Ambulate: >150 feet;with modified independence;with least restrictive assistive device PT Goal: Ambulate - Progress: Progressing toward goal Pt will Go Up /  Down Stairs: Other (comment) PT Goal: Up/Down Stairs - Progress: Discontinued (comment) (due to patient with only one step to enter house )  Visit Information  Last PT Received On: 08/10/12    Subjective Data  Subjective: This is just terrible.  Do you really think I need it? (HHPT)   Cognition  Cognition Arousal/Alertness: Awake/alert Behavior During Therapy: WFL for tasks assessed/performed Overall Cognitive Status: Within Functional Limits for tasks assessed    Balance  Static Standing Balance Static Standing - Balance Support: No upper extremity supported Static Standing - Level of Assistance: 6: Modified independent (Device/Increase time) Static Standing - Comment/# of Minutes: standing about 1 minute prior to walking to fix lines and gowns  End of Session PT - End of Session Equipment Utilized During Treatment: Gait belt Activity Tolerance: Patient tolerated treatment well Patient left: in chair;with family/visitor present;with call bell/phone within reach   GP     [facility] 08/10/2012, 3:50 PM [place], [place] [phone] 08/10/2012

## 2012-08-11 ENCOUNTER — Other Ambulatory Visit: Payer: Self-pay | Admitting: *Deleted

## 2012-08-11 DIAGNOSIS — K922 Gastrointestinal hemorrhage, unspecified: Secondary | ICD-10-CM | POA: Diagnosis not present

## 2012-08-11 DIAGNOSIS — R58 Hemorrhage, not elsewhere classified: Secondary | ICD-10-CM | POA: Diagnosis not present

## 2012-08-11 DIAGNOSIS — R578 Other shock: Secondary | ICD-10-CM | POA: Diagnosis not present

## 2012-08-11 DIAGNOSIS — I728 Aneurysm of other specified arteries: Secondary | ICD-10-CM

## 2012-08-11 DIAGNOSIS — N289 Disorder of kidney and ureter, unspecified: Secondary | ICD-10-CM | POA: Diagnosis not present

## 2012-08-11 LAB — BASIC METABOLIC PANEL
BUN: 17 mg/dL (ref 6–23)
CO2: 28 mEq/L (ref 19–32)
Calcium: 9 mg/dL (ref 8.4–10.5)
Chloride: 103 mEq/L (ref 96–112)
Creatinine, Ser: 1 mg/dL (ref 0.50–1.35)
GFR calc Af Amer: 85 mL/min — ABNORMAL LOW (ref >90)
GFR calc non Af Amer: 74 mL/min — ABNORMAL LOW (ref >90)
Glucose, Bld: 104 mg/dL — ABNORMAL HIGH (ref 70–99)
Potassium: 4.1 mEq/L (ref 3.5–5.1)
Sodium: 139 mEq/L (ref 135–145)

## 2012-08-11 LAB — PHOSPHORUS: Phosphorus: 3.4 mg/dL (ref 2.3–4.6)

## 2012-08-11 LAB — CBC
MCV: 85.9 fL (ref 78.0–100.0)
Platelets: 192 10*3/uL (ref 150–400)
RBC: 2.84 MIL/uL — ABNORMAL LOW (ref 4.22–5.81)
WBC: 8.3 10*3/uL (ref 4.0–10.5)

## 2012-08-11 LAB — MAGNESIUM: Magnesium: 2.2 mg/dL (ref 1.5–2.5)

## 2012-08-11 MED ORDER — ACETAMINOPHEN 325 MG PO TABS: 650.0000 mg | ORAL_TABLET | ORAL | Status: DC | PRN

## 2012-08-11 NOTE — Progress Notes (Signed)
      [facility]          [facility] [place]  S: Pt doing well except some DOE. Also C/O bilat edema in legs. Appetite fair.  Filed Vitals:   08/10/12 2005 08/10/12 2015 08/10/12 2358 08/11/12 0353  BP:  112/66 116/64 121/85  Pulse:  95 83 82  Temp: 98.2 F (36.8 C)  99 F (37.2 C) 99.3 F (37.4 C)  TempSrc: Oral  Oral Oral  Resp:  19 27 22   Height:      Weight:    236 lb 5.3 oz (107.2 kg)  SpO2:  94% 96% 98%    PE:  Lungs clear with crackles at bases Abd soft BLE bilateral pitting edema  I/O last 3 completed shifts: In: 360 [P.O.:360] Out: 2050 [Urine:2050]   Assessment:  AKI : Scr now WNL - diuresing DOE - ? Lasix today Acute blood loss anemia - H/H stable F/U 4-6 weeks with CTA  Abd/Pelvis

## 2012-08-11 NOTE — Discharge Summary (Signed)
Physician Discharge Summary  Patient ID: Sean Stanley MRN: [id number] DOB/AGE: 1940/12/23 72 y.o.  Admit date: 08/04/2012 Discharge date: 08/11/2012  Problem List Principal Problem:   Acute GI bleeding, spontaneous  Active Problems:   Hemorrhagic shock- now stable   Coagulopathy- chronic anticoagulation with Xarelto   Bradycardia- HR 58 NSR on admission 08/04/12   Atrial flutter, paroxysmal- noted 11/13. S/P TEE CV Nov 2013. 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 Patient was transferred to [facility] for vascular surgery to evaluate. In the ED the patient was noted to remain bradycardic and BP was very fluctuant. Patient was in severe pain upon evaluation and history was obtained from family.  Hospital Course:  SIGNIFICANT EVENTS / STUDIES:  5/13>>>Intrabdominal bleeding.  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  - IS and flutter valve.  - Pulmonary hygienes.  CARDIOVASCULAR  A: HTN and a-fib/flutter, hx SVT s/p cardioversion.  Hemorrhagic shock from intraabdominal bleed, stabilized  P:  - Coreg reversed, glucagon off 5/13;  metoprolol IV started 5/15 and now on PO metoprolol and well tolerated, will continue.  - Volume resuscitation complete, will KVO IVF.  - See H/O section.  - Dr Mora Bellman al following, ? Whether any other intervention needs to be made since embolization was not possible. Following at this time  RENAL  A: Acute renal insufficiency, progressive, oliguric >> improving  Lactic acidosis, resolved  P:  - Monitor I/O closely.  - Monitor renal function and intraabdominal pressure.  - Continue uroxatral 5/15 for BPH.  GASTROINTESTINAL  A: Intraabdominal hemorrhage.  PDA pseudoaneurysm on CT angio  N/V.  P:  - Pain control, but careful w narcs.  - Diet as tolerated.  HEMATOLOGIC  A: Hemorrhagic shock, coagulopathic and massive transfusion, resolved  P:  - Follow CBC q12h, and INR intermittently, will defer anticoagulation recommendations for vascular.  INFECTIOUS  A: No active issues.  P:  - Monitor fever curve and WBC.  ENDOCRINE  A: No history of diabetes.  P:  - Follow [facility]  NEUROLOGIC  A: No active issues.  P:  - Monitor neuro status with narcotics.  OPTHO:  P:  - Xalatan for his glaucoma  GLOBAL:  P:  - Transferred to SDU bed 5/16, -D/C cordis . 5-20 remains stable and is discharged home.    Labs at discharge Lab Results  Component Value Date   CREATININE 1.00 08/11/2012   BUN 17 08/11/2012   NA 139 08/11/2012   K 4.1 08/11/2012   CL 103 08/11/2012   CO2 28 08/11/2012   Lab  Results  Component Value Date   WBC 8.3 08/11/2012   HGB 8.3* 08/11/2012   HCT 24.4* 08/11/2012   MCV 85.9 08/11/2012   PLT 192 08/11/2012   Lab Results  Component Value Date   ALT 74* 08/10/2012   AST 29 08/10/2012   ALKPHOS 63 08/10/2012   BILITOT 3.3* 08/10/2012   Lab Results  Component Value Date   INR 1.20 08/06/2012   INR 1.40 08/05/2012   INR 1.33 08/05/2012    Current radiology studies Dg Chest Port 1 View  08/10/2012   *RADIOLOGY REPORT*  Clinical Data: Pneumonia.  PORTABLE CHEST - 1  VIEW  Comparison: 08/06/2012.  Findings: The right IJ catheter has been removed.  Persistent low lung volumes with vascular crowding, bibasilar atelectasis and/or infiltrates and small effusions.  IMPRESSION:  1.  Removal of right IJ catheter. 2.  Persistent low lung volumes with small bilateral pleural effusions, bibasilar atelectasis and/or infiltrates.   Original Report Authenticated By: Rudie Meyer, M.D.    Disposition:  01-Home or Self Care  Discharge Orders   Future Appointments Provider Department Dept Phone   09/03/2012 9:45 AM Thurmon Fair, MD [facility] [place] [phone]   06/03/2013 9:00 AM Sherrie George, MD [facility] [phone]   Future Orders Complete By Expires     Discharge patient  As directed         Medication List    STOP taking these medications       minocycline 50 MG capsule  Commonly known as:  MINOCIN,DYNACIN     XARELTO 20 MG Tabs  Generic drug:  Rivaroxaban      TAKE these medications       acetaminophen 325 MG tablet  Commonly known as:  TYLENOL  Take 2 tablets (650 mg total) by mouth every 4 (four) hours as needed.     alfuzosin 10 MG 24 hr tablet  Commonly known as:  UROXATRAL  Take 10 mg by mouth daily.     carvedilol 6.25 MG tablet  Commonly known as:  COREG  Take 9.375 mg by mouth 2 (two) times daily. Takes 1 1/2 tabs=9.375MG      COSAMIN DS PO  Take 1 tablet by mouth 2 (two) times daily.     latanoprost 0.005 % ophthalmic solution  Commonly known as:  XALATAN  Place 1 drop into both eyes at bedtime.     neomycin-bacitracin-polymyxin ophthalmic ointment  Commonly known as:  NEOSPORIN  Place 1 application into the left eye 2 (two) times daily.           Follow-up Information   Follow up with Myra Gianotti IV, Lala Lund, MD In 4 weeks. (office will arrange-sent)    Contact information:   [address] [place] [address] [phone]       Follow up with Darnelle Bos, MD. (call and make an appoimtment for next 2 weeks)    Contact information:   [address], [facility] AND [address] [place] [address] [phone]       Follow up with Thurmon Fair, MD In 2 weeks. (office will call you)    Contact information:   [address] [place] [address] [phone]        Discharged Condition: good  Time spent on discharge greater than 40 minutes.  Vital signs at Discharge. Temp:  [97.7 F (36.5 C)-99.3 F (37.4 C)] 97.7 F (36.5 C) (05/20 0811) Pulse Rate:  [  82-95] 90 (05/20 0812) Resp:  [19-27] 21 (05/20 0812) BP: (108-121)/(64-85) 108/73 mmHg (05/20 0811) SpO2:  [94 %-98 %] 97 % (05/20 0811) Weight:  [107.2 kg (236 lb 5.3 oz)] 107.2 kg (236 lb 5.3 oz) (05/20 0353) Office follow up Special Information or instructions. Follow up with Dr. Earl Gala within 2 weeks. No further anticoagulation or NSAIDS. Follow up with Vascular surgeon 4 weeks with CT of abdomen. Signed: Brett Canales Porshea Janowski ACNP Ad[facility] Pollack PCCM Pager [phone] till 3 pm If no answer page [phone] 08/11/2012, 11:07 AM

## 2012-08-11 NOTE — Progress Notes (Signed)
Discharge instructions given to patient and wife with teachback. No complaints.  Dressing removed from groin and bandaide placed.  Site unremarkable.  NSL discontinued without event.  Site unremarkable.

## 2012-08-12 ENCOUNTER — Telehealth: Payer: Self-pay | Admitting: Surgery

## 2012-08-12 DIAGNOSIS — R300 Dysuria: Secondary | ICD-10-CM | POA: Diagnosis not present

## 2012-08-12 DIAGNOSIS — R350 Frequency of micturition: Secondary | ICD-10-CM | POA: Diagnosis not present

## 2012-08-12 NOTE — Telephone Encounter (Signed)
Patient aware of CTA and and appt info - kf

## 2012-08-12 NOTE — Telephone Encounter (Signed)
Message copied by Margaretmary Eddy on Wed Aug 12, 2012  9:58 AM ------      Message from: Sean Stanley      Created: Tue Aug 11, 2012  7:29 AM       4-6 weeks with a CT angiogram of the abdomen and pelvis - [place]            Pt has pseudoaneurysm of pancreaticoduodenal artery branch  ------

## 2012-08-14 NOTE — Discharge Summary (Signed)
Patient seen and examined, agree with above note.  I dictated the care and orders written for this patient under my direction.  Wesam G Yacoub, MD [phone] 

## 2012-08-24 DIAGNOSIS — S3690XA Unspecified injury of unspecified intra-abdominal organ, initial encounter: Secondary | ICD-10-CM | POA: Diagnosis not present

## 2012-08-25 ENCOUNTER — Encounter: Payer: Self-pay | Admitting: Cardiovascular Disease

## 2012-08-27 ENCOUNTER — Encounter: Payer: Self-pay | Admitting: Cardiovascular Disease

## 2012-08-27 ENCOUNTER — Ambulatory Visit (INDEPENDENT_AMBULATORY_CARE_PROVIDER_SITE_OTHER): Payer: Medicare Other | Admitting: Cardiovascular Disease

## 2012-08-27 ENCOUNTER — Telehealth: Payer: Self-pay | Admitting: Cardiovascular Disease

## 2012-08-27 VITALS — BP 100/64 | HR 69 | Resp 20 | Ht 73.5 in | Wt 215.6 lb

## 2012-08-27 DIAGNOSIS — D62 Acute posthemorrhagic anemia: Secondary | ICD-10-CM | POA: Diagnosis not present

## 2012-08-27 DIAGNOSIS — IMO0001 Reserved for inherently not codable concepts without codable children: Secondary | ICD-10-CM

## 2012-08-27 DIAGNOSIS — Z0389 Encounter for observation for other suspected diseases and conditions ruled out: Secondary | ICD-10-CM

## 2012-08-27 DIAGNOSIS — I4892 Unspecified atrial flutter: Secondary | ICD-10-CM

## 2012-08-27 DIAGNOSIS — I059 Rheumatic mitral valve disease, unspecified: Secondary | ICD-10-CM

## 2012-08-27 DIAGNOSIS — R578 Other shock: Secondary | ICD-10-CM | POA: Diagnosis not present

## 2012-08-27 DIAGNOSIS — K661 Hemoperitoneum: Secondary | ICD-10-CM

## 2012-08-27 DIAGNOSIS — K683 Retroperitoneal hematoma: Secondary | ICD-10-CM

## 2012-08-27 DIAGNOSIS — I739 Peripheral vascular disease, unspecified: Secondary | ICD-10-CM

## 2012-08-27 MED ORDER — TRAMADOL HCL 50 MG PO TABS: 50.0000 mg | ORAL_TABLET | Freq: Four times a day (QID) | ORAL | Status: DC | PRN

## 2012-08-27 NOTE — Progress Notes (Signed)
Patient ID: Sean Stanley, male   DOB: 07-02-1940, 72 y.o.   MRN: [id number]      Reason for office visit Atrial flutter, status post retroperitoneal hematoma  Mr. Rhew had a spontaneous retroperitoneal hematoma while on Xarelto anticoagulation for previous episodes of atrial flutter. He has a history of mitral valve repair without residual mitral insufficiency (2002, [facility], Dr. Silvestre Mesi). He was extremely ill. He was in hemorrhagic shock and required transfusion of large amounts of blood products. Attempts at embolization of the bleeding pancreaticoduodenal artery pseudoaneurysm were not successful.  He continues to have considerable flank pain that radiates into his pelvis scrotum and thighs. This is consistent with the retroperitoneal distribution of the hematoma in the area of the cell last muscle. He has not had any palpitations, tachycardia, chest pain or shortness of breath. He continues to feel weak but believes he is getting stronger daily.  He looks more energetic and less pale today. His hemoglobin has been increasing slowly.   Allergies  Allergen Reactions  . Crestor (Rosuvastatin) Other (See Comments)    Muscle/chest pain  . Contrast Media (Iodinated Diagnostic Agents) Other (See Comments)    UNKNOWN REACTION PER PT  . Penicillins Rash    Current Outpatient Prescriptions  Medication Sig Dispense Refill  . acetaminophen (TYLENOL) 325 MG tablet Take 2 tablets (650 mg total) by mouth every 4 (four) hours as needed.      Marland Kitchen alfuzosin (UROXATRAL) 10 MG 24 hr tablet Take 10 mg by mouth daily.      . carvedilol (COREG) 6.25 MG tablet Take 9.375 mg by mouth 2 (two) times daily. Takes 1 1/2 tabs=9.375MG       . Glucosamine-Chondroitin (COSAMIN DS PO) Take 1 tablet by mouth 2 (two) times daily.      Marland Kitchen latanoprost (XALATAN) 0.005 % ophthalmic solution Place 1 drop into both eyes at bedtime.      . traMADol (ULTRAM) 50 MG tablet Take 1 tablet (50 mg total) by mouth every 6 (six)  hours as needed for pain.  30 tablet  1   No current facility-administered medications for this visit.    Past Medical History  Diagnosis Date  . Atrial flutter   . Hypertension   . Hypercholesteremia   . Mitral valve disease     annuloplasty 2002 [facility]  . Carotid stenosis     03/10/2007 right & left  bulbs & ICAs 0-49% reduction  ,right subclavian 50% reduction    Past Surgical History  Procedure Laterality Date  . Mitral valve repair  2002    [facility]  . Tee without cardioversion  02/12/2012    Procedure: TRANSESOPHAGEAL ECHOCARDIOGRAM (TEE);  Surgeon: [name], MD;  Location: [facility] ENDOSCOPY;  Service: Cardiovascular;  Laterality: N/A;  . Cardioversion  02/12/2012    Procedure: CARDIOVERSION;  Surgeon: [name], MD;  Location: [facility] ENDOSCOPY;  Service: Cardiovascular;  Laterality: N/A;  . Right heart cath  06/19/2004    normal right heart dynamics. EF 50%  . Nm myoview ltd  07/22/2006    no ischemia    Family History  Problem Relation Age of Onset  . Parkinson's disease Mother 28  . Heart failure Father 49  . Cancer Maternal Grandmother   . Heart attack Maternal Grandfather   . Heart attack Paternal Grandmother   . Heart attack Paternal Grandfather     History   Social History  . Marital Status: Married    Spouse Name: N/A    Number  of Children: N/A  . Years of Education: N/A   Occupational History  . Not on file.   Social History Main Topics  . Smoking status: Never Smoker   . Smokeless tobacco: Never Used  . Alcohol Use: Yes  . Drug Use: No  . Sexually Active: Not on file   Other Topics Concern  . Not on file   Social History Narrative  . No narrative on file    Review of systems: The patient specifically denies any chest pain at rest or with exertion, dyspnea at rest or with exertion, orthopnea, paroxysmal nocturnal dyspnea, syncope, palpitations, focal neurological deficits, intermittent claudication, lower extremity edema, unexplained  weight gain, cough, hemoptysis or wheezing.   PHYSICAL EXAM BP 100/64  Pulse 69  Resp 20  Ht 6' 1.5" (1.867 m)  Wt 97.796 kg (215 lb 9.6 oz)  BMI 28.06 kg/m2  General: Alert, oriented x3, no distress; he is slightly pale Head: no evidence of trauma, PERRL, EOMI, no exophtalmos or lid lag, no myxedema, no xanthelasma; normal ears, nose and oropharynx Neck: normal jugular venous pulsations and no hepatojugular reflux; brisk carotid pulses without delay and no carotid bruits Chest: clear to auscultation, no signs of consolidation by percussion or palpation, normal fremitus, symmetrical and full respiratory excursions Cardiovascular: normal position and quality of the apical impulse, regular rhythm, normal first and second heart sounds, no murmurs, rubs or gallops Abdomen: no tenderness or distention, no masses by palpation, no abnormal pulsatility or arterial bruits, normal bowel sounds, no hepatosplenomegaly Extremities: no clubbing, cyanosis or edema; 2+ radial, ulnar and brachial pulses bilaterally; 2+ right femoral, posterior tibial and dorsalis pedis pulses; 2+ left femoral, posterior tibial and dorsalis pedis pulses; no subclavian or femoral bruits Neurological: grossly nonfocal   EKG: Normal sinus rhythm, incomplete criteria for left ventricular hypertrophy  Lipid Panel  No results found for this basename: chol, trig, hdl, cholhdl, vldl, ldlcalc    BMET    Component Value Date/Time   NA 139 08/11/2012 0405   K 4.1 08/11/2012 0405   CL 103 08/11/2012 0405   CO2 28 08/11/2012 0405   GLUCOSE 104* 08/11/2012 0405   BUN 17 08/11/2012 0405   CREATININE 1.00 08/11/2012 0405   CALCIUM 9.0 08/11/2012 0405   GFRNONAA 74* 08/11/2012 0405   GFRAA 85* 08/11/2012 0405   CBC:    Component Value Date/Time   WBC 8.3 08/11/2012 0405   HGB 8.3* 08/11/2012 0405   HCT 24.4* 08/11/2012 0405   PLT 192 08/11/2012 0405   MCV 85.9 08/11/2012 0405   NEUTROABS 12.6* 08/04/2012 1701   LYMPHSABS 1.0  08/04/2012 1701   MONOABS 1.0 08/04/2012 1701   EOSABS 0.0 08/04/2012 1701   BASOSABS 0.0 08/04/2012 1701    Reportedly a more recent hemoglobin was around 11.  ASSESSMENT AND PLAN Atrial flutter, persistent; status post TE guided cardioversion November 2013 Although the arrhythmia was not particularly symptomatic and has not recurred since his cardioversion he is not a good candidate for repeat anticoagulation. As such he is very interested in undergoing atrial flutter ablation. For the time being I think even aspirin his paralytics and no stroke prevention therapy is indicated. I believe his risk of stroke is relatively low since the rhythm was typical atrial flutter  he has never had a cardioembolic event or stroke. He does not have hypertension, diabetes mellitus or congestive heart failure. I have referred him for electrophysiology consultation to discuss radiofrequency caval tricuspid isthmus ablation.  Acute post-hemorrhagic anemia  This is slowly improving. I recommended that he start taking folic acid 1 mg daily since this may hasten bone marrow response.  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 CBC  . Ambulatory referral to Cardiac Electrophysiology  . EKG 12-Lead   Meds ordered this encounter  Medications  . traMADol (ULTRAM) 50 MG tablet    Sig: Take 1 tablet (50 mg total) by mouth every 6 (six) hours as needed for pain.    Dispense:  30 tablet    Refill:  1    Kijuan Gallicchio  Thurmon Fair, MD, [facility] [phone] office [phone] pager

## 2012-08-27 NOTE — Patient Instructions (Addendum)
Your physician has recommended you make the following change in your medication: tramadol 50 mg every 8 hours as needed for pain. Check blood counts in 1 month Your physician recommends that you schedule a follow-up appointment in: 3 months You have been referred to Electrophysiology, [facility], [phone]

## 2012-08-27 NOTE — Telephone Encounter (Signed)
[facility] is calling-pt just saw Dr C this morning-was suppose have called in  Prescription for Tramadol-pt just left-please call this in today!

## 2012-08-28 NOTE — Telephone Encounter (Signed)
Called in tramadol 50 mg 0 R to [facility] spoke with scott. Spoke with patient and informed her that prescription was send to the pharmacy with no refils.

## 2012-08-28 NOTE — Telephone Encounter (Signed)
Rx sent to [facility] instead of [facility].  [facility] service line unavailable until 9am.  Will call later.  Call to pt and informed.  Pt verbalized understanding and agreed w/ plan.  Will call pt once complete.

## 2012-08-28 NOTE — Telephone Encounter (Signed)
Just called the pharmacy-Still does not have his prescription for his Tramadol-Please Please call this in to [phone]-Please call today!

## 2012-08-29 ENCOUNTER — Encounter: Payer: Self-pay | Admitting: Cardiovascular Disease

## 2012-08-29 DIAGNOSIS — D62 Acute posthemorrhagic anemia: Secondary | ICD-10-CM | POA: Insufficient documentation

## 2012-08-29 DIAGNOSIS — K661 Hemoperitoneum: Secondary | ICD-10-CM | POA: Insufficient documentation

## 2012-08-29 DIAGNOSIS — K683 Retroperitoneal hematoma: Secondary | ICD-10-CM | POA: Insufficient documentation

## 2012-08-29 NOTE — Assessment & Plan Note (Signed)
This is slowly improving. I recommended that he start taking folic acid 1 mg daily since this may hasten bone marrow response.

## 2012-08-29 NOTE — Assessment & Plan Note (Addendum)
No significant residual mitral insufficiency and no mitral stenosis by echo. Normal coronaries by angiography performed most recently in 2006 (note history of false positive nuclear stress test).

## 2012-08-29 NOTE — Assessment & Plan Note (Signed)
Although the arrhythmia was not particularly symptomatic and has not recurred since his cardioversion he is not a good candidate for repeat anticoagulation. As such he is very interested in undergoing atrial flutter ablation. For the time being I think even aspirin his paralytics and no stroke prevention therapy is indicated. I believe his risk of stroke is relatively low since the rhythm was typical atrial flutter  he has never had a cardioembolic event or stroke. He does not have hypertension, diabetes mellitus or congestive heart failure. I have referred him for electrophysiology consultation to discuss radiofrequency caval tricuspid isthmus ablation.

## 2012-09-03 ENCOUNTER — Ambulatory Visit: Payer: Medicare Other | Admitting: Cardiovascular Disease

## 2012-09-17 DIAGNOSIS — R58 Hemorrhage, not elsewhere classified: Secondary | ICD-10-CM | POA: Insufficient documentation

## 2012-09-18 ENCOUNTER — Other Ambulatory Visit: Payer: Self-pay | Admitting: Cardiovascular Disease

## 2012-09-18 ENCOUNTER — Encounter: Payer: Self-pay | Admitting: Surgery

## 2012-09-18 DIAGNOSIS — H00019 Hordeolum externum unspecified eye, unspecified eyelid: Secondary | ICD-10-CM | POA: Diagnosis not present

## 2012-09-18 DIAGNOSIS — H4011X0 Primary open-angle glaucoma, stage unspecified: Secondary | ICD-10-CM | POA: Diagnosis not present

## 2012-09-21 ENCOUNTER — Ambulatory Visit (INDEPENDENT_AMBULATORY_CARE_PROVIDER_SITE_OTHER): Payer: Medicare Other | Admitting: Surgery

## 2012-09-21 ENCOUNTER — Encounter: Payer: Self-pay | Admitting: Surgery

## 2012-09-21 ENCOUNTER — Ambulatory Visit
Admission: RE | Admit: 2012-09-21 | Discharge: 2012-09-21 | Disposition: A | Discharge status: Home / Self Care | Payer: Medicare Other | Source: Ambulatory Visit | Attending: Surgery | Admitting: Surgery

## 2012-09-21 VITALS — BP 123/62 | HR 63 | Resp 16 | Ht 72.5 in | Wt 209.0 lb

## 2012-09-21 DIAGNOSIS — K661 Hemoperitoneum: Secondary | ICD-10-CM | POA: Diagnosis not present

## 2012-09-21 DIAGNOSIS — I728 Aneurysm of other specified arteries: Secondary | ICD-10-CM

## 2012-09-21 DIAGNOSIS — I729 Aneurysm of unspecified site: Secondary | ICD-10-CM

## 2012-09-21 DIAGNOSIS — M79609 Pain in unspecified limb: Secondary | ICD-10-CM | POA: Diagnosis not present

## 2012-09-21 MED ORDER — IOHEXOL 350 MG/ML SOLN
80.0000 mL | Freq: Once | INTRAVENOUS | Status: AC | PRN
  Administered 2012-09-21: 80 mL via INTRAVENOUS

## 2012-09-21 NOTE — Addendum Note (Signed)
Addended by: Adria Dill L on: 09/21/2012 04:56 PM   Modules accepted: Orders

## 2012-09-21 NOTE — Progress Notes (Addendum)
Vascular and Vein Specialist of [facility]   Patient name: Sean Stanley MRN: [id number] DOB: [birth year] Sex: male     Chief Complaint  Patient presents with  . New Evaluation    C/O Abdominal pain, duration 1 1/2 months - Angiogram 08/05/12  Pt. had CTA today Gsb. Img    HISTORY OF PRESENT ILLNESS: The patient is here today for followup. On 08/04/2012, he presented to the emergency department with complaints of severe abdominal pain. He was hypotensive and diaphoretic. A noncontrast CT scan revealed a large intraperitoneal hematoma. He was resuscitated and transferred to [facility]. A CT angiogram was performed. 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 The patient takes  Lesia Hausen for atrial flutter. His anticoagulation was reversed. Radiology attempted to embolize the pseudoaneurysm, however they could not get to this artery. The patient ultimately stabilized. A followup CT angiogram showed no active extravasation. The patient was ultimately able to be discharged. He is here today stating that his abdominal pain is still present but has significantly decreased. He takes 1 ultrasound in the morning and one at night. He takes 1000 mg of Tylenol twice during the day. He states that his appetite is improving as is his energy level. Up until last week he was requiring a nap in the afternoon. He no longer has to do this. He is still having issues with sleep at night, although this has been a chronic problem for him.  The patient reports that on file the stay weekend he had an episode of perioral numbness and tingling in the fingers on his left hand.  He is now currently taking folate for his chronic anemia which is improving. His next blood draws next week. He is also scheduled to see electrophysiology for possible ablation. He is not on any anticoagulation at this time.  Past Medical History  Diagnosis Date  . Atrial flutter    . Hypertension   . Hypercholesteremia   . Mitral valve disease     annuloplasty 2002 [facility]  . Carotid stenosis     03/10/2007 right & left  bulbs & ICAs 0-49% reduction  ,right subclavian 50% reduction    Past Surgical History  Procedure Laterality Date  . Mitral valve repair  2002    [facility]  . Tee without cardioversion  02/12/2012    Procedure: TRANSESOPHAGEAL ECHOCARDIOGRAM (TEE);  Surgeon: [name], MD;  Location: [facility] ENDOSCOPY;  Service: Cardiovascular;  Laterality: N/A;  . Cardioversion  02/12/2012    Procedure: CARDIOVERSION;  Surgeon: [name], MD;  Location: [facility] ENDOSCOPY;  Service: Cardiovascular;  Laterality: N/A;  . Right heart cath  06/19/2004    normal right heart dynamics. EF 50%  . Nm myoview ltd  07/22/2006    no ischemia  . Abdominal angiogram  08/05/12    History   Social History  . Marital Status: Married    Spouse Name: N/A    Number of Children: N/A  . Years of Education: N/A   Occupational History  . Not on file.   Social History Main Topics  . Smoking status: Never Smoker   . Smokeless tobacco: Never Used  . Alcohol Use: Yes  . Drug Use: No  . Sexually Active: Not on file   Other Topics Concern  . Not on file   Social History Narrative  . No narrative on file    Family History  Problem Relation Age of Onset  . Parkinson's  disease Mother 80  . Heart failure Father 54  . Cancer Maternal Grandmother   . Heart attack Maternal Grandfather   . Heart attack Paternal Grandmother   . Heart attack Paternal Grandfather     Allergies as of 09/21/2012 - Review Complete 09/21/2012  Allergen Reaction Noted  . Crestor (rosuvastatin) Other (See Comments) 02/12/2012  . Penicillins Rash 02/12/2012    Current Outpatient Prescriptions on File Prior to Visit  Medication Sig Dispense Refill  . acetaminophen (TYLENOL) 325 MG tablet Take 2 tablets (650 mg total) by mouth every 4 (four) hours as needed.      Marland Kitchen alfuzosin (UROXATRAL) 10 MG 24 hr  tablet Take 10 mg by mouth daily.      . carvedilol (COREG) 6.25 MG tablet Take 9.375 mg by mouth 2 (two) times daily. Takes 1 1/2 tabs=9.375MG       . Glucosamine-Chondroitin (COSAMIN DS PO) Take 1 tablet by mouth 2 (two) times daily.      Marland Kitchen latanoprost (XALATAN) 0.005 % ophthalmic solution Place 1 drop into both eyes at bedtime.      . traMADol (ULTRAM) 50 MG tablet Take 1 tablet (50 mg total) by mouth every 6 (six) hours as needed for pain.  30 tablet  1   No current facility-administered medications on file prior to visit.     REVIEW OF SYSTEMS: Please see history of present illness, otherwise all systems negative   PHYSICAL EXAMINATION:   Vital signs are There were no vitals taken for this visit. General: The patient appears their stated age. HEENT:  No gross abnormalities Pulmonary:  Non labored breathing Abdomen: Right upper quadrant mass is felt. This is tender. Musculoskeletal: There are no major deformities. Neurologic: No focal weakness or paresthesias are detected, Skin: There are no ulcer or rashes noted. Psychiatric: The patient has normal affect. Cardiovascular: There is a regular rate and rhythm without significant murmur appreciated.   Diagnostic Studies CT angiogram was ordered today. This shows that the hematoma has significantly decreased in size. Originally it was 22 cm now is down to 14. 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 His pain is decreasing. He has noted improvement in his quality of life and energy level. His CT scan today is reassuring, and that the hematoma is decreasing in size. In addition no pseudoaneurysm is identified. I will plan on repeating his CT scan in 3 months to further evaluate the size of the hematoma and to make sure that there is no perfusion of the  pseudoaneurysm.  The patient does describe what sounds like a TIA. I am starting him back on a baby aspirin today. I do not think that this will increase his risk of bleeding.   After interventional radiology reviewed the CT scan it was identified the area in the splenic artery appears to be slightly larger. This will need to be evaluated in 3 months at his next CT scan.  I called and spoke to the patients wife and told her about this finding.  She is interested in looking into what might be causing this.  I wtold her we could undergo a arteritis workup once he has fully recovered, as I fear that we may get false positives on testing currently   V. Charlena Cross, M.D. [facility] and Vein Specialists of [place] Office: [phone] Pager:  [phone]

## 2012-09-22 ENCOUNTER — Telehealth: Payer: Self-pay | Admitting: Cardiovascular Disease

## 2012-09-22 NOTE — Telephone Encounter (Signed)
Returning Your Call

## 2012-09-22 NOTE — Telephone Encounter (Signed)
Dr Myra Gianotti wants know him to take a baby aspirin once a day! having some numbness around mouth and in his two right fingers-that is why he prescribed the aspirin! Is this all right for him to take the aspirin?

## 2012-09-22 NOTE — Telephone Encounter (Signed)
Returning your call .. Please Call  ° ° °Thanks  °

## 2012-09-22 NOTE — Telephone Encounter (Signed)
Returned call and informed Sean Stanley per instructions by MD/PA.  Verbalized understanding and agreed w/ plan.  Sean Stanley also asked if RN knew anything about the Rx they are trying to get.  Stated [facility] will not deliver tramadol until a week after they have left for vacation.  Sean Stanley asked if a supply can be sent to [facility] on [address].  Informed Dr. Royann Shivers is out of the country and RN will notify PA for further instructions and it is not guaranteed that Rx will be written as this med is not usually rx'd in cardiology.  Sean Stanley verbalized understanding and agreed w/ plan.  RN called to [facility] to check status of delivery as Rx was sent and confirmed on 6.5.14.  Informed tramadol was processed and sent out today for #30 which is an 8 day supply.  Stated they quote patients up to 12 days, but it usually takes more like 3-4 days.    Call to [facility] and informed.  Stated she is aware, but they just want to be sure pt doesn't run out of meds.  Stated they are leaving on Monday to go out of town for a week.  Wants to know if pt can get a week supply.  Asked how often pt is taking med and Sean Stanley stated pt takes tramadol three times a day.  Informed PA will be notified and advised they f/u with Dr. Royann Shivers upon his return if pt will need this longer.  Verbalized understanding and understands RN will not call back r/t this before tomorrow.

## 2012-09-22 NOTE — Telephone Encounter (Signed)
If the medication has very been mailed out,  but to wait until Thursday this week to see this received before adding another script.   Sean Stanley 4:49 PM

## 2012-09-22 NOTE — Telephone Encounter (Signed)
Returned call.  Left message to call back before 4pm.  Appears pt wants to know if it is okay for him to take a baby  ASA one daily for numbness around mouth and in two fingers.  Message forwarded to B. Leron Croak, PA-C for further instructions.  Chart# [id number] on cart.

## 2012-09-22 NOTE — Telephone Encounter (Signed)
Yes. Ok to take ASA 81 mg daily. Brandy Zuba 1:48 PM

## 2012-09-23 ENCOUNTER — Encounter: Payer: Self-pay | Admitting: *Deleted

## 2012-09-24 ENCOUNTER — Encounter: Payer: Self-pay | Admitting: Internal Medicine

## 2012-09-24 ENCOUNTER — Telehealth: Payer: Self-pay | Admitting: *Deleted

## 2012-09-24 ENCOUNTER — Ambulatory Visit (INDEPENDENT_AMBULATORY_CARE_PROVIDER_SITE_OTHER): Payer: Medicare Other | Admitting: Internal Medicine

## 2012-09-24 VITALS — BP 118/72 | HR 64 | Ht 73.0 in | Wt 211.0 lb

## 2012-09-24 DIAGNOSIS — I4892 Unspecified atrial flutter: Secondary | ICD-10-CM

## 2012-09-24 DIAGNOSIS — I059 Rheumatic mitral valve disease, unspecified: Secondary | ICD-10-CM | POA: Diagnosis not present

## 2012-09-24 DIAGNOSIS — I739 Peripheral vascular disease, unspecified: Secondary | ICD-10-CM

## 2012-09-24 DIAGNOSIS — D62 Acute posthemorrhagic anemia: Secondary | ICD-10-CM | POA: Diagnosis not present

## 2012-09-24 LAB — CBC
Hemoglobin: 11.3 g/dL — ABNORMAL LOW (ref 13.0–17.0)
RBC: 4.07 MIL/uL — ABNORMAL LOW (ref 4.22–5.81)
WBC: 5.8 10*3/uL (ref 4.0–10.5)

## 2012-09-24 MED ORDER — TRAMADOL HCL 50 MG PO TABS: 50.0000 mg | ORAL_TABLET | Freq: Four times a day (QID) | ORAL | Status: DC | PRN

## 2012-09-24 NOTE — Assessment & Plan Note (Addendum)
Will need to review ECG to see if typical vs possibly related to atriotomy incision.  In the event that his typical flutter, recommend consideration for catheter ablation given the relative residual risk of CHADS-VASc score of one versus the procedure. In the event that is thought to be related to the atriotomy, I would probably not undertake ablation at the need for concomitant anticoagulation and procedure risks might be significantly greater.  I have now seen the electrocardiogram and cannot be sure it represents isthmus dependent flutter. I think given that, I would probably elect in the absence of a greater assurance that the bleeding risks are normalized, avoid anticoagulation and thus the procedure  It is further complicated by a transient episode of numbness around his lips and on his left hand raising the spectre of TE event  i spoke with Dr Pearlean Brownie who saysthis symptom complex is related to small vessel disease and not thromboembolic disease and recommended aspirin. Dr. Myra Gianotti who saw the patient recently had released him to take low-dose aspirin and we'll begin him on 81 mg  The issue of anticoagulation was complicated by a lifethreatening retroperiotneal bleed

## 2012-09-24 NOTE — Telephone Encounter (Signed)
Spoke to [place] @ [facility] and had her fax a EKG over for Dr. Graciela Husbands.

## 2012-09-24 NOTE — Progress Notes (Signed)
[facility]   ELECTROPHYSIOLOGY CONSULT NOTE  Patient ID: Sean Stanley, MRN: [id number], DOB/AGE: [birth year] 71 y.o. Admit date: (Not on file) Date of Consult: 09/24/2012  Primary Physician: Sean Bos, MD Primary Cardiologist: Sean Stanley  Chief Complaint: atrial flutter   HPI Sean Stanley is a 71 y.o. male referred for consideration of ablation of atrial flutter.  The patient presented to fall 2013 with atrial flutter (ECG-below) he underwent cardioversion with support of Rivaroxaban which was then maintained going forward. He had a severe intraperitoneal bleed 5/13 requiring prolonged hospitalization. A CT angiogram was performed. 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  Radiology attempted to embolize the pseudoaneurysm, however they could not get to this artery.  there is also a comment about an enlarging splenic artery and Dr. Therese Stanley last note.  He also describes as spell of perioral numbness that was accompanied by numbness in his hand.(see below)  He had his nonobstructive vascular disease and his age of 64 for a CHADS-VASc score of one; I don't know how nonobstructive vascular disease informs his risk.  Is otherwise fit without chest pain shortness of breath. He may have some mild chronic peripheral edema but he is not sure.  He is status post minimally invasive mitral valve surgery for mitral valve prolapse. This was done at [facility] in 2002   atrial  Past Medical History  Diagnosis Date  . Atrial flutter   . Hypertension   . Hypercholesteremia   . Mitral valve disease     annuloplasty 2002 [facility]  . Carotid stenosis     03/10/2007 right & left  bulbs & ICAs 0-49% reduction  ,right subclavian 50% reduction      Surgical History:  Past Surgical History  Procedure Laterality Date  . Mitral valve repair  2002    [facility]  . Tee without cardioversion  02/12/2012    Procedure: TRANSESOPHAGEAL  ECHOCARDIOGRAM (TEE);  Surgeon: Sean Nose, MD;  Location: [facility] ENDOSCOPY;  Service: Cardiovascular;  Laterality: N/A;  . Cardioversion  02/12/2012    Procedure: CARDIOVERSION;  Surgeon: Sean Nose, MD;  Location: [facility] ENDOSCOPY;  Service: Cardiovascular;  Laterality: N/A;  . Right heart cath  06/19/2004    normal right heart dynamics. EF 50%  . Nm myoview ltd  07/22/2006    no ischemia  . Abdominal angiogram  08/05/12     Home Meds: Prior to Admission medications   Medication Sig Start Date End Date Taking? Authorizing Provider  acetaminophen (TYLENOL) 325 MG tablet Take 2 tablets (650 mg total) by mouth every 4 (four) hours as needed. 08/11/12  Yes Sean Reedy, MD  alfuzosin (UROXATRAL) 10 MG 24 hr tablet Take 10 mg by mouth daily.   Yes Historical Provider, MD  carvedilol (COREG) 6.25 MG tablet Take 9.375 mg by mouth 2 (two) times daily. Takes 1 1/2 tabs=9.375MG    Yes Historical Provider, MD  folic acid (FOLVITE) 1 MG tablet Take 1 mg by mouth daily.   Yes Historical Provider, MD  latanoprost (XALATAN) 0.005 % ophthalmic solution Place 1 drop into both eyes at bedtime.   Yes Historical Provider, MD  Multiple Vitamin (MULTIVITAMIN) tablet Take 1 tablet by mouth daily. Centrum Silver   Yes Historical Provider, MD  neomycin-bacitracin-polymyxin (NEOSPORIN) ophthalmic ointment 4 (four) times daily.   Yes Historical Provider, MD  traMADol (ULTRAM) 50 MG tablet Take 1 tablet (50 mg total) by mouth every 6 (six) hours as needed for pain.  09/24/12  Yes Sean Finlay, PA-C      Allergies:  Allergies  Allergen Reactions  . Crestor (Rosuvastatin) Other (See Comments)    Muscle/chest pain  . Penicillins Rash    History   Social History  . Marital Status: Married    Spouse Name: N/A    Number of Children: N/A  . Years of Education: N/A   Occupational History  . Not on file.   Social History Main Topics  . Smoking status: Never Smoker   . Smokeless tobacco: Never Used  .  Alcohol Use: Yes  . Drug Use: No  . Sexually Active: Not on file   Other Topics Concern  . Not on file   Social History Narrative  . No narrative on file     Family History  Problem Relation Age of Onset  . Parkinson's disease Mother 21  . Heart failure Father 33  . Cancer Maternal Grandmother   . Heart attack Maternal Grandfather   . Heart attack Paternal Grandmother   . Heart attack Paternal Grandfather      ROS:  Please see the history of present illness.     All other systems reviewed and negative.    Physical Exam: Blood pressure 118/72, pulse 64, height 6\' 1"  (1.854 m), weight 211 lb (95.709 kg). General: Well developed, well nourished male in no acute distress. Head: Normocephalic, atraumatic, sclera non-icteric, no xanthomas, nares are without discharge. EENT: normal Lymph Nodes:  none Back: without scoliosis/kyphosis, no CVA tendersness Neck: Negative for carotid bruits. JVD not elevated. Lungs: Clear bilaterally to auscultation without wheezes, rales, or rhonchi. Breathing is unlabored. Heart: RRR with S1 S2.  murmur , rubs, or gallops appreciated. Abdomen: Soft, non-tender, non-distended with normoactive bowel sounds. No hepatomegaly. No rebound/guarding. No obvious abdominal masses. Msk:  Strength and tone appear normal for age. Extremities: No clubbing or cyanosis. tr edema.  Distal pedal pulses are 2+ and equal bilaterally. Skin: Warm and Dry Neuro: Alert and oriented X 3. CN III-XII intact Grossly normal sensory and motor function . Psych:  Responds to questions appropriately with a normal affect.      Labs: Cardiac Enzymes No results found for this basename: CKTOTAL, CKMB, TROPONINI,  in the last 72 hours CBC Lab Results  Component Value Date   WBC 8.3 08/11/2012   HGB 8.3* 08/11/2012   HCT 24.4* 08/11/2012   MCV 85.9 08/11/2012   PLT 192 08/11/2012   PROTIME: No results found for this basename: LABPROT, INR,  in the last 72 hours Chemistry No  results found for this basename: NA, K, CL, CO2, BUN, CREATININE, CALCIUM, LABALBU, PROT, BILITOT, ALKPHOS, ALT, AST, GLUCOSE,  in the last 168 hours Lipids No results found for this basename: CHOL, HDL, LDLCALC, TRIG   BNP Pro B Natriuretic peptide (BNP)  Date/Time Value Range Status  08/10/2012  5:44 AM 1156.0* 0 - 125 pg/mL Final  08/04/2012  5:02 PM 235.1* 0 - 125 pg/mL Final   Miscellaneous No results found for this basename: DDIMER    Radiology/Studies:  Ct Angio Abdomen W/cm &/or Wo Contrast  09/21/2012   **ADDENDUM** CREATED: 09/21/2012 14:40:40  Upon further review there is a 2.3 x 1.4 x 1.5 cm pseudoaneurysm of the splenic artery (image 42 of series 4) which is unchanged in retrospect compared to the prior examination.  **END ADDENDUM** SIGNED BY: Florencia Reasons, M.D.  09/21/2012   *RADIOLOGY REPORT*  Clinical Data:  History of pancreaticoduodenal pseudoaneurysm.  CT ANGIOGRAPHY ABDOMEN  Technique:  Multidetector CT imaging of the abdomen was performed using the standard protocol during bolus administration of intravenous contrast.  Multiplanar reconstructed images including MIPs were obtained and reviewed to evaluate the vascular anatomy.  Contrast: 80mL OMNIPAQUE IOHEXOL 350 MG/ML SOLN  Comparison:  CT of the abdomen and pelvis 08/04/2012.  Findings:  Lung Bases: Linear opacities in the lung bases bilaterally, favored to represent areas of chronic scarring.  Atherosclerotic calcifications in the left anterior descending and right coronary arteries.  Calcifications of the mitral annulus.  Vascular:  Abdominal aorta - Diseased with moderate atherosclerosis, without evidence of aneurysm or dissection.  Celiac axis - Acute angulation and moderate stenosis at the origin of the celiac axis may be related to compression from the crus of the left hemidiaphragm.  There is post stenotic dilatation in the celiac axis which measures up to 1.4 cm in diameter.  The main branches of the celiac axis  and larger subbranches are unremarkable in appearance.  Specifically, the superior pancreaticoduodenal artery is normal in appearance without definite pseudoaneurysm.  SMA - The superior mesenteric artery is widely patent at the origin.  Main branches of the superior mesenteric artery are widely patent.  The inferior pancreaticoduodenal artery appears normal in appearance, without definite residual pseudoaneurysm.  IMA - Widely patent at the ostium and throughout the visualized portions.  Abdomen:  As with the prior examination, there is a large retroperitoneal fluid collection on the right side, which measures up to the 14 x 10.1 x 14.4 cm.  This is of intermediate attenuation on today's examination (approximate 34 HU), and is most compatible with a resolving hematoma.  This does have a thick rim of soft tissue surrounding it, suggesting that this is partially encapsulated.  Small subcentimeter hypervascular lesion in segment two of the liver is only noted on the arterial phase of the examination, favored to represent a benign perfusion anomaly.  The remainder of the liver is otherwise unremarkable in appearance.  The appearance of the gallbladder, spleen, bilateral adrenal glands and the right kidney is unremarkable.  Subcentimeter low-attenuation lesion in the left kidney is too small to definitively characterize, but is similar to the prior study, therefore favored to represent a small cyst.  The body and tail of the pancreas are unremarkable in appearance.  The head and uncinate process are displaced anteriorly and medially related to the large right sided retroperitoneal hematoma, but are otherwise unremarkable in appearance.  Musculoskeletal: There are no aggressive appearing lytic or blastic lesions noted in the visualized portions of the skeleton.  Review of the MIP images confirms the above findings.  IMPRESSION: 1.  Large right sided retroperitoneal hematoma redemonstrated, as above. 2.  Previously noted  pseudoaneurysm of the inferior pancreaticoduodenal artery is no longer confidently identified on today's examination. 3.  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 4.  Additional incidental findings, as above.   Original Report Authenticated By: Trudie Reed, M.D.    EKG: NSR 17/1142 Electrocardiogram of atrial flutter 02/03/2012 demonstrates atypical flutter with upright flutter waves in lead V1 a relatively typical flutter waves in leads 3 or flutter wave is relatively steep and symmetric in leads 2 and F.  Assessment and Plan: *   Sherryl Manges

## 2012-09-24 NOTE — Assessment & Plan Note (Signed)
I don't think that this significantly informs CHADS-VASc risk

## 2012-09-24 NOTE — Assessment & Plan Note (Signed)
Will try and get the operative report to identify the incision site but I assume it was the sulcus in the intra-atrial septum

## 2012-09-24 NOTE — Telephone Encounter (Signed)
Returned call and spoke w/ Harriett Sine.  Stated shipment has not been received yet.  Informed temp supply will be sent to pharmacy.  Verbalized understanding.  Refill(s) sent to pharmacy per B. Hager, PA-C.

## 2012-10-02 ENCOUNTER — Telehealth: Payer: Self-pay | Admitting: Cardiovascular Disease

## 2012-10-02 NOTE — Telephone Encounter (Signed)
Returned call.  Pt requesting lab results.  Pt informed Dr. Royann Shivers is out of the office and his results have not been reviewed.  Pt wanted to know Hgb and informed result was 11.3.  Pt also asked if there is a portal he can go to where he can see his results.  Pt informed his MyChart account is still pending.  Pt had code and stated it expired on June 19th.  Pt informed RN will need to get more information regarding how he can get a new code for MyChart and will call back.  Pt verbalized understanding and agreed w/ plan.  Spoke w/ Aram Beecham, front desk, and informed pt can call [phone] to get help with MyChart.  Call to pt and informed.  Pt verbalized understanding and agreed w/ plan.

## 2012-10-02 NOTE — Telephone Encounter (Signed)
Had a lab test done 09/25/12 and wanting to know the results .Sean Stanley Please call patient insists on getting the results of lab work today .Sean Stanley    Thanks

## 2012-10-07 ENCOUNTER — Telehealth: Payer: Self-pay | Admitting: Internal Medicine

## 2012-10-07 NOTE — Telephone Encounter (Signed)
New prob   Pt wife said at the last visit on July 2 Dr Graciela Husbands discussed getting some further EKG tracing done.  She wants to just follow up with you regarding it.

## 2012-10-07 NOTE — Telephone Encounter (Signed)
Left message for pt wife, dr Graciela Husbands will back in the office tomorrow and will discuss with him then and call them back.

## 2012-10-08 NOTE — Telephone Encounter (Signed)
Discussed with dr Graciela Husbands, due to the scar the pt has in his atrium we are not sure if the flutter he is having is from the scar or if not. Dr Graciela Husbands has discussed this with dr allred. Spoke with pt wife, appt made for pt to see dr allred to discuss.

## 2012-10-22 DIAGNOSIS — E7800 Pure hypercholesterolemia, unspecified: Secondary | ICD-10-CM | POA: Diagnosis not present

## 2012-10-22 DIAGNOSIS — R58 Hemorrhage, not elsewhere classified: Secondary | ICD-10-CM | POA: Diagnosis not present

## 2012-10-22 DIAGNOSIS — D649 Anemia, unspecified: Secondary | ICD-10-CM | POA: Diagnosis not present

## 2012-10-27 DIAGNOSIS — G479 Sleep disorder, unspecified: Secondary | ICD-10-CM | POA: Diagnosis not present

## 2012-10-28 ENCOUNTER — Other Ambulatory Visit: Payer: Self-pay

## 2012-11-05 DIAGNOSIS — R58 Hemorrhage, not elsewhere classified: Secondary | ICD-10-CM | POA: Diagnosis not present

## 2012-11-16 ENCOUNTER — Ambulatory Visit (INDEPENDENT_AMBULATORY_CARE_PROVIDER_SITE_OTHER): Payer: Medicare Other | Admitting: Internal Medicine

## 2012-11-16 ENCOUNTER — Encounter: Payer: Self-pay | Admitting: Internal Medicine

## 2012-11-16 VITALS — BP 121/77 | HR 60 | Ht 73.0 in | Wt 210.6 lb

## 2012-11-16 DIAGNOSIS — I498 Other specified cardiac arrhythmias: Secondary | ICD-10-CM

## 2012-11-16 DIAGNOSIS — I4892 Unspecified atrial flutter: Secondary | ICD-10-CM | POA: Diagnosis not present

## 2012-11-16 DIAGNOSIS — R001 Bradycardia, unspecified: Secondary | ICD-10-CM

## 2012-11-16 NOTE — Progress Notes (Signed)
PCP:  Darnelle Bos, MD  The patient presents today for EP follow-up.  He was previously seen by Dr Graciela Husbands (his note is reviewed today).  He has a prior atriotomy scar and flutter in 2013.  Though the ekg is suggestive of typical flutter, reentry around the atriotomy scar was also considered.  He was therefore referred to me for further evaulation.  Since last being seen in our clinic, the patient reports doing very well.  He remains active.  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  He has followup planned at [facility] in several weeks.  Today, he denies symptoms of palpitations, chest pain, shortness of breath, orthopnea, PND, lower extremity edema, dizziness, presyncope, syncope, or neurologic sequela.  The patient feels that he is tolerating medications without difficulties and is otherwise without complaint today.   Past Medical History  Diagnosis Date  . Atrial flutter   . Hypertension   . Hypercholesteremia   . Mitral valve disease     annuloplasty 2002 [facility]  . Carotid stenosis     03/10/2007 right & left  bulbs & ICAs 0-49% reduction  ,right subclavian 50% reduction   Past Surgical History  Procedure Laterality Date  . Mitral valve repair  2002    [facility]  . Tee without cardioversion  02/12/2012    Procedure: TRANSESOPHAGEAL ECHOCARDIOGRAM (TEE);  Surgeon: [name], MD;  Location: [facility] ENDOSCOPY;  Service: Cardiovascular;  Laterality: N/A;  . Cardioversion  02/12/2012    Procedure: CARDIOVERSION;  Surgeon: [name], MD;  Location: [facility] ENDOSCOPY;  Service: Cardiovascular;  Laterality: N/A;  . Right heart cath  06/19/2004    normal right heart dynamics. EF 50%  . Nm myoview ltd  07/22/2006    no ischemia  . Abdominal angiogram  08/05/12    Current Outpatient Prescriptions  Medication Sig Dispense Refill  . alfuzosin (UROXATRAL) 10 MG 24 hr tablet Take 10 mg by mouth daily.      . carvedilol (COREG) 6.25 MG tablet Take 6.25 mg by mouth  2 (two) times daily with a meal.      . folic acid (FOLVITE) 1 MG tablet Take 1 mg by mouth daily.      Marland Kitchen latanoprost (XALATAN) 0.005 % ophthalmic solution Place 1 drop into both eyes at bedtime.      . Multiple Vitamin (MULTIVITAMIN) tablet Take 1 tablet by mouth daily. Centrum Silver       No current facility-administered medications for this visit.    Allergies  Allergen Reactions  . Crestor [Rosuvastatin] Other (See Comments)    Other reaction(s): Muscle Pain Muscle/chest pain Muscle/chest pain  . Ampicillin Rash  . Penicillins Rash    History   Social History  . Marital Status: Married    Spouse Name: N/A    Number of Children: N/A  . Years of Education: N/A   Occupational History  . Not on file.   Social History Main Topics  . Smoking status: Never Smoker   . Smokeless tobacco: Never Used  . Alcohol Use: Yes     Comment: social  . Drug Use: No  . Sexual Activity: Not on file   Other Topics Concern  . Not on file   Social History Narrative   Lives in [place], Retired    Family History  Problem Relation Age of Onset  . Parkinson's disease Mother 71  . Heart failure Father 52  . Cancer Maternal Grandmother   . Heart attack  Maternal Grandfather   . Heart attack Paternal Grandmother   . Heart attack Paternal Grandfather     ROS-  All systems are reviewed and are negative except as outlined in the HPI above  Physical Exam: There were no vitals filed for this visit.  GEN- The patient is well appearing, alert and oriented x 3 today.   Head- normocephalic, atraumatic Eyes-  Sclera clear, conjunctiva pink Ears- hearing intact Oropharynx- clear Neck- supple, no JVP Lymph- no cervical lymphadenopathy Lungs- Clear to ausculation bilaterally, normal work of breathing Heart- Regular rate and rhythm, no murmurs, rubs or gallops, PMI not laterally displaced GI- soft, NT, ND, + BS Extremities- no clubbing, cyanosis, or edema MS- no significant deformity or  atrophy Skin- no rash or lesion Psych- euthymic mood, full affect Neuro- strength and sensation are intact  ekg today reveals sinus rhythm 60 bpm, otherwise normal ekg Epic notes and prior tee reports are reviewed  Assessment and Plan:  1. Atrial flutter The patient had a single episode of atrial flutter in 2013.  EKG suggests that this could be isthmus dependant, though I cannot exclude a left atrial flutter.  The patients CHADS2VASC score is only 1. Therapeutic strategies for atrial flutter including medicine and ablation were discussed in detail with the patient today. Risk, benefits, and alternatives to EP study and radiofrequency ablation were also discussed in detail today.  At this point, he is clear that he would like to defer ablation.  I think that this is a very reasonable approach. His CHADS2VASC score is only 1.  Per guidelines, he does not warrant long term anticoagulation at this time. He will continue to follow with Dr C.  Should he decide to pursue ablation in the future, then I am happy to discuss further with him at that time. I will see as needed going forward.

## 2012-11-16 NOTE — Patient Instructions (Signed)
Your physician recommends that you schedule a follow-up appointment as needed with Dr Allred   

## 2012-11-24 DIAGNOSIS — S3690XA Unspecified injury of unspecified intra-abdominal organ, initial encounter: Secondary | ICD-10-CM | POA: Diagnosis not present

## 2012-11-24 DIAGNOSIS — I4892 Unspecified atrial flutter: Secondary | ICD-10-CM | POA: Diagnosis not present

## 2012-11-24 DIAGNOSIS — G4700 Insomnia, unspecified: Secondary | ICD-10-CM | POA: Diagnosis not present

## 2012-11-25 ENCOUNTER — Ambulatory Visit (INDEPENDENT_AMBULATORY_CARE_PROVIDER_SITE_OTHER): Payer: Medicare Other | Admitting: Cardiovascular Disease

## 2012-11-25 ENCOUNTER — Encounter: Payer: Self-pay | Admitting: Cardiovascular Disease

## 2012-11-25 VITALS — BP 120/78 | HR 68 | Resp 16 | Ht 73.0 in | Wt 211.9 lb

## 2012-11-25 DIAGNOSIS — I4729 Other ventricular tachycardia: Secondary | ICD-10-CM

## 2012-11-25 DIAGNOSIS — I4891 Unspecified atrial fibrillation: Secondary | ICD-10-CM | POA: Diagnosis not present

## 2012-11-25 DIAGNOSIS — I4892 Unspecified atrial flutter: Secondary | ICD-10-CM | POA: Diagnosis not present

## 2012-11-25 DIAGNOSIS — I059 Rheumatic mitral valve disease, unspecified: Secondary | ICD-10-CM | POA: Diagnosis not present

## 2012-11-25 DIAGNOSIS — I472 Ventricular tachycardia: Secondary | ICD-10-CM | POA: Diagnosis not present

## 2012-11-25 NOTE — Patient Instructions (Addendum)
Your physician recommends that you schedule a follow-up appointment in: 3 months.  

## 2012-11-27 NOTE — Assessment & Plan Note (Signed)
This occurred during a period of hyperadrenergic state. Symptomatic events. He is on beta blocker therapy.

## 2012-11-27 NOTE — Progress Notes (Signed)
Patient ID: Sean Stanley, male   DOB: [birth year], 72 y.o.   MRN: [id number]     Reason for office visit Followup atrial flutter  Mr. Galluzzo seems to have recovered completely from his recent illness. 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 He had had atrial flutter that required TE guided cardioversion roughly 6 months earlier. He has not had any recurrent arrhythmia. Following the bleeding event he has been off all anti-coagulation and off all antiplatelet therapy. We have discussed ablation therapy and he has now been evaluated by Dr. Graciela Husbands and Dr. Johney Frame. After careful consideration they have recommended conservative management and believe that he is at low risk of cardioembolic events. He is planning to return to [facility] for second attempt at embolization of the pseudoaneurysm.  Allergies  Allergen Reactions  . Crestor [Rosuvastatin] Other (See Comments)    Other reaction(s): Muscle Pain Muscle/chest pain Muscle/chest pain  . Ampicillin Rash  . Penicillins Rash    Current Outpatient Prescriptions  Medication Sig Dispense Refill  . alfuzosin (UROXATRAL) 10 MG 24 hr tablet Take 10 mg by mouth daily.      . carvedilol (COREG) 6.25 MG tablet Take 6.25 mg by mouth 2 (two) times daily with a meal.      . folic acid (FOLVITE) 1 MG tablet Take 1 mg by mouth daily.      Marland Kitchen latanoprost (XALATAN) 0.005 % ophthalmic solution Place 1 drop into both eyes at bedtime.      . Multiple Vitamin (MULTIVITAMIN) tablet Take 1 tablet by mouth daily. Centrum Silver       No current facility-administered medications for this visit.    Past Medical History  Diagnosis Date  . Atrial flutter   . Hypertension   . Hypercholesteremia   . Mitral valve disease     annuloplasty 2002 [facility]  . Carotid stenosis     03/10/2007 right & left  bulbs & ICAs 0-49% reduction  ,right subclavian 50% reduction     Past Surgical History  Procedure Laterality Date  . Mitral valve repair  2002    [facility]  . Tee without cardioversion  02/12/2012    Procedure: TRANSESOPHAGEAL ECHOCARDIOGRAM (TEE);  Surgeon: [name], MD;  Location: [facility] ENDOSCOPY;  Service: Cardiovascular;  Laterality: N/A;  . Cardioversion  02/12/2012    Procedure: CARDIOVERSION;  Surgeon: [name], MD;  Location: [facility] ENDOSCOPY;  Service: Cardiovascular;  Laterality: N/A;  . Right heart cath  06/19/2004    normal right heart dynamics. EF 50%  . Nm myoview ltd  07/22/2006    no ischemia  . Abdominal angiogram  08/05/12    Family History  Problem Relation Age of Onset  . Parkinson's disease Mother 25  . Heart failure Father 3  . Cancer Maternal Grandmother   . Heart attack Maternal Grandfather   . Heart attack Paternal Grandmother   . Heart attack Paternal Grandfather     History   Social History  . Marital Status: Married    Spouse Name: N/A    Number of Children: N/A  . Years of Education: N/A   Occupational History  . Not on file.   Social History Main Topics  . Smoking status: Never Smoker   . Smokeless tobacco: Never Used  . Alcohol Use: Yes     Comment: social  . Drug Use: No  . Sexual Activity: Not on file   Other Topics Concern  .  Not on file   Social History Narrative   Lives in [place], Retired    Review of systems: The patient specifically denies any chest pain at rest exertion, dyspnea at rest or with exertion, orthopnea, paroxysmal nocturnal dyspnea, syncope, palpitations, focal neurological deficits, intermittent claudication, lower extremity edema, unexplained weight gain, cough, hemoptysis or wheezing.   PHYSICAL EXAM BP 120/78  Pulse 68  Resp 16  Ht 6\' 1"  (1.854 m)  Wt 211 lb 14.4 oz (96.117 kg)  BMI 27.96 kg/m2  General: Alert, oriented x3, no distress Head: no evidence of trauma, PERRL, EOMI, no exophtalmos or lid lag, no myxedema, no xanthelasma; normal ears, nose  and oropharynx Neck: normal jugular venous pulsations and no hepatojugular reflux; brisk carotid pulses without delay and no carotid bruits Chest: clear to auscultation, no signs of consolidation by percussion or palpation, normal fremitus, symmetrical and full respiratory excursions Cardiovascular: normal position and quality of the apical impulse, regular rhythm, normal first and second heart sounds, no murmurs, rubs or gallops Abdomen: no tenderness or distention, no masses by palpation, no abnormal pulsatility or arterial bruits, normal bowel sounds, no hepatosplenomegaly Extremities: no clubbing, cyanosis or edema; 2+ radial, ulnar and brachial pulses bilaterally; 2+ right femoral, posterior tibial and dorsalis pedis pulses; 2+ left femoral, posterior tibial and dorsalis pedis pulses; no subclavian or femoral bruits Neurological: grossly nonfocal   EKG: Sinus rhythm incomplete right bundle branch block no change  Lipid Panel  No results found for this basename: chol, trig, hdl, cholhdl, vldl, ldlcalc    BMET    Component Value Date/Time   NA 139 08/11/2012 0405   K 4.1 08/11/2012 0405   CL 103 08/11/2012 0405   CO2 28 08/11/2012 0405   GLUCOSE 104* 08/11/2012 0405   BUN 17 08/11/2012 0405   CREATININE 1.00 08/11/2012 0405   CALCIUM 9.0 08/11/2012 0405   GFRNONAA 74* 08/11/2012 0405   GFRAA 85* 08/11/2012 0405     ASSESSMENT AND PLAN Atrial flutter, persistent; status post TE guided cardioversion November 2013 He has now been evaluated by 2 electrophysiologists who both recommended a conservative approach. This is secondary to the fact that there is suspicion that his flutter may be scar related rather than typical counterclockwise right atrial flutter, which would lead to a more difficult ablation procedure and potentially lower yield of ablation success. In addition they both feel that his risk of cardioembolic events is low. Although he does have valvular heart disease this has been  "repaired" and theoretically his risk of embolic events is therefore lower. Currently he is not taking any stroke prevention medication. He is due to undergo repeat angiography and coil embolization of his pseudoaneurysm. Until that time I think it is reasonable to stay off antiplatelet and anticoagulation therapy, considering how severe his hemorrhagic event was.  NSVT (nonsustained ventricular tachycardia) This occurred during a period of hyperadrenergic state. Symptomatic events. He is on beta blocker therapy.  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

## 2012-11-27 NOTE — Assessment & Plan Note (Signed)
He has now been evaluated by 2 electrophysiologists who both recommended a conservative approach. This is secondary to the fact that there is suspicion that his flutter may be scar related rather than typical counterclockwise right atrial flutter, which would lead to a more difficult ablation procedure and potentially lower yield of ablation success. In addition they both feel that his risk of cardioembolic events is low. Although he does have valvular heart disease this has been "repaired" and theoretically his risk of embolic events is therefore lower. Currently he is not taking any stroke prevention medication. He is due to undergo repeat angiography and coil embolization of his pseudoaneurysm. Until that time I think it is reasonable to stay off antiplatelet and anticoagulation therapy, considering how severe his hemorrhagic event was.

## 2012-12-15 DIAGNOSIS — Z5181 Encounter for therapeutic drug level monitoring: Secondary | ICD-10-CM | POA: Diagnosis not present

## 2012-12-15 DIAGNOSIS — I728 Aneurysm of other specified arteries: Secondary | ICD-10-CM | POA: Diagnosis not present

## 2012-12-15 DIAGNOSIS — Z79899 Other long term (current) drug therapy: Secondary | ICD-10-CM | POA: Diagnosis not present

## 2012-12-16 DIAGNOSIS — Z5181 Encounter for therapeutic drug level monitoring: Secondary | ICD-10-CM | POA: Diagnosis not present

## 2012-12-16 DIAGNOSIS — I728 Aneurysm of other specified arteries: Secondary | ICD-10-CM | POA: Diagnosis not present

## 2012-12-16 DIAGNOSIS — Z79899 Other long term (current) drug therapy: Secondary | ICD-10-CM | POA: Diagnosis not present

## 2012-12-17 DIAGNOSIS — I728 Aneurysm of other specified arteries: Secondary | ICD-10-CM | POA: Diagnosis not present

## 2012-12-17 DIAGNOSIS — Z5181 Encounter for therapeutic drug level monitoring: Secondary | ICD-10-CM | POA: Diagnosis not present

## 2012-12-17 DIAGNOSIS — Z79899 Other long term (current) drug therapy: Secondary | ICD-10-CM | POA: Diagnosis not present

## 2012-12-21 DIAGNOSIS — R768 Other specified abnormal immunological findings in serum: Secondary | ICD-10-CM | POA: Insufficient documentation

## 2012-12-28 ENCOUNTER — Ambulatory Visit: Payer: Medicare Other | Admitting: Surgery

## 2012-12-28 ENCOUNTER — Other Ambulatory Visit: Payer: Medicare Other

## 2013-01-05 DIAGNOSIS — H35419 Lattice degeneration of retina, unspecified eye: Secondary | ICD-10-CM | POA: Diagnosis not present

## 2013-01-05 DIAGNOSIS — H18419 Arcus senilis, unspecified eye: Secondary | ICD-10-CM | POA: Diagnosis not present

## 2013-01-05 DIAGNOSIS — H4010X0 Unspecified open-angle glaucoma, stage unspecified: Secondary | ICD-10-CM | POA: Diagnosis not present

## 2013-01-05 DIAGNOSIS — H409 Unspecified glaucoma: Secondary | ICD-10-CM | POA: Diagnosis not present

## 2013-01-05 DIAGNOSIS — H2510 Age-related nuclear cataract, unspecified eye: Secondary | ICD-10-CM | POA: Diagnosis not present

## 2013-01-28 ENCOUNTER — Other Ambulatory Visit: Payer: Self-pay

## 2013-02-13 DIAGNOSIS — R002 Palpitations: Secondary | ICD-10-CM | POA: Diagnosis not present

## 2013-02-16 ENCOUNTER — Encounter: Payer: Self-pay | Admitting: Cardiology

## 2013-02-16 ENCOUNTER — Ambulatory Visit (INDEPENDENT_AMBULATORY_CARE_PROVIDER_SITE_OTHER): Payer: Medicare Other | Admitting: Cardiology

## 2013-02-16 VITALS — BP 118/88 | HR 73 | Ht 77.0 in | Wt 212.0 lb

## 2013-02-16 DIAGNOSIS — R002 Palpitations: Secondary | ICD-10-CM | POA: Insufficient documentation

## 2013-02-16 DIAGNOSIS — I059 Rheumatic mitral valve disease, unspecified: Secondary | ICD-10-CM

## 2013-02-16 DIAGNOSIS — K922 Gastrointestinal hemorrhage, unspecified: Secondary | ICD-10-CM | POA: Diagnosis not present

## 2013-02-16 DIAGNOSIS — I729 Aneurysm of unspecified site: Secondary | ICD-10-CM | POA: Diagnosis not present

## 2013-02-16 DIAGNOSIS — Z0389 Encounter for observation for other suspected diseases and conditions ruled out: Secondary | ICD-10-CM

## 2013-02-16 DIAGNOSIS — I4891 Unspecified atrial fibrillation: Secondary | ICD-10-CM | POA: Diagnosis not present

## 2013-02-16 DIAGNOSIS — I4892 Unspecified atrial flutter: Secondary | ICD-10-CM | POA: Diagnosis not present

## 2013-02-16 DIAGNOSIS — IMO0001 Reserved for inherently not codable concepts without codable children: Secondary | ICD-10-CM

## 2013-02-16 DIAGNOSIS — I728 Aneurysm of other specified arteries: Secondary | ICD-10-CM

## 2013-02-16 NOTE — Assessment & Plan Note (Signed)
He had coiling done at [facility] Sept 2014

## 2013-02-16 NOTE — Assessment & Plan Note (Addendum)
Acute, life threatening bleed June 2014.

## 2013-02-16 NOTE — Patient Instructions (Signed)
Your physician has recommended that you wear a holter monitor. Holter monitors are medical devices that record the heart's electrical activity. Doctors most often use these monitors to diagnose arrhythmias. Arrhythmias are problems with the speed or rhythm of the heartbeat. The monitor is a small, portable device. You can wear one while you do your normal daily activities. This is usually used to diagnose what is causing palpitations/syncope (passing out).   

## 2013-02-16 NOTE — Assessment & Plan Note (Signed)
Last echo Nov 2013. Dr Royann Shivers feels he has non-valvular atrial flutter.

## 2013-02-16 NOTE — Progress Notes (Signed)
02/16/2013 Sean Stanley   [birth year]  [id number]  Primary Physicia Darnelle Bos, MD Primary Cardiologist: Dr Royann Shivers  HPI:  Mr. Sean Stanley is a 72 y/o male who had minimally invasive mitral valve repair at [facility] in 2002. He has had problems with atrial flutter. He had TEE CV in November.  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 He has not had any recurrent arrhythmia. Following the bleeding event he has been off all anti-coagulation and off all antiplatelet therapy. We have discussed ablation therapy and he has now been evaluated by Dr. Graciela Husbands and Dr. Johney Frame. After careful consideration they have recommended conservative management and believe that he is at low risk of cardioembolic events. He has been to [facility] for embolization of his pseudoaneurysm.         He is in the office today with complaints of irregular beats. He denies any sustained tachycardia. He says he just "doesn't feel right". He apparently has had bradycardia and intolerance to Toprol in the past. He and his wife are quit concerned and want these extra beats evaluated further.    Current Outpatient Prescriptions  Medication Sig Dispense Refill  . alfuzosin (UROXATRAL) 10 MG 24 hr tablet Take 10 mg by mouth daily.      . carvedilol (COREG) 6.25 MG tablet Take 6.25 mg by mouth 2 (two) times daily with a meal.      . latanoprost (XALATAN) 0.005 % ophthalmic solution Place 1 drop into both eyes at bedtime.      . Multiple Vitamin (MULTIVITAMIN) tablet Take 1 tablet by mouth daily. Centrum Silver      . folic acid (FOLVITE) 1 MG tablet Take 1 mg by mouth daily.       No current facility-administered medications for this visit.    Allergies  Allergen Reactions  . Crestor [Rosuvastatin] Other (See Comments)    Other reaction(s): Muscle Pain Muscle/chest pain Muscle/chest pain  . Ampicillin Rash  . Penicillins Rash     History   Social History  . Marital Status: Married    Spouse Name: N/A    Number of Children: N/A  . Years of Education: N/A   Occupational History  . Not on file.   Social History Main Topics  . Smoking status: Never Smoker   . Smokeless tobacco: Never Used  . Alcohol Use: Yes     Comment: social  . Drug Use: No  . Sexual Activity: Not on file   Other Topics Concern  . Not on file   Social History Narrative   Lives in [place], Retired     Review of Systems: General: negative for chills, fever, night sweats or weight changes.  Cardiovascular: negative for chest pain, dyspnea on exertion, edema, orthopnea, palpitations, paroxysmal nocturnal dyspnea or shortness of breath Dermatological: negative for rash Respiratory: negative for cough or wheezing Urologic: negative for hematuria Abdominal: negative for nausea, vomiting, diarrhea, bright red blood per rectum, melena, or hematemesis Neurologic: negative for visual changes, syncope, or dizziness All other systems reviewed and are otherwise negative except as noted above.    Blood pressure 118/88, pulse 73, height 6\' 5"  (1.956 m), weight 212 lb (96.163 kg).  General appearance: alert, cooperative and no distress Neck: no carotid bruit and no JVD Lungs: clear to auscultation bilaterally Heart: regular rate and rhythm  EKG NSR  ASSESSMENT AND PLAN:   Palpitations This is his primary concern today. He describes  irregular beats as opposed to irregular sustained rhythm.  Atrial flutter, status post TE guided cardioversion November 2013 .  Acute GI bleeding, spontaneous  Acute, life threatening bleed June 2014.  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 Dr Royann Shivers feels he has non-valvular atrial flutter.  Pseudoaneurysm of pancreatic artery He had coiling done at [facility] Sept 2014  Normal coronary arteries at cath 2006 (false positive  Nuc) .    PLAN  I tried to re assure Mr and Ms Pablo Lawrence that extra systoles are usually not problematic. I am hesitant to increase his beta blocker further. I suggested an event monitor for further evaluation.   Forbes Loll KPA-C 02/16/2013 5:09 PM

## 2013-02-16 NOTE — Assessment & Plan Note (Signed)
This is his primary concern today. He describes irregular beats as opposed to irregular sustained rhythm.

## 2013-02-17 ENCOUNTER — Encounter: Payer: Self-pay | Admitting: Cardiology

## 2013-02-24 ENCOUNTER — Encounter: Payer: Self-pay | Admitting: Cardiovascular Disease

## 2013-02-24 ENCOUNTER — Ambulatory Visit (INDEPENDENT_AMBULATORY_CARE_PROVIDER_SITE_OTHER): Payer: Medicare Other | Admitting: Cardiovascular Disease

## 2013-02-24 VITALS — BP 112/64 | HR 80 | Resp 16 | Ht 73.5 in | Wt 215.1 lb

## 2013-02-24 DIAGNOSIS — K683 Retroperitoneal hematoma: Secondary | ICD-10-CM

## 2013-02-24 DIAGNOSIS — K661 Hemoperitoneum: Secondary | ICD-10-CM

## 2013-02-24 DIAGNOSIS — I498 Other specified cardiac arrhythmias: Secondary | ICD-10-CM

## 2013-02-24 DIAGNOSIS — R001 Bradycardia, unspecified: Secondary | ICD-10-CM

## 2013-02-24 DIAGNOSIS — I4892 Unspecified atrial flutter: Secondary | ICD-10-CM | POA: Diagnosis not present

## 2013-02-24 DIAGNOSIS — I059 Rheumatic mitral valve disease, unspecified: Secondary | ICD-10-CM

## 2013-02-24 NOTE — Assessment & Plan Note (Signed)
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

## 2013-02-24 NOTE — Assessment & Plan Note (Addendum)
He has only had one confirmed episode of sustained atrial flutter. At this point in time there is no evidence of recurrence. His palpitations are due to isolated PACs and PVCs. He has been evaluated by two different electrophysiologists. They both felt that his risk of embolic events is low. They recommended against anticoagulation and did not recommend ablation therapy. Under these circumstances, identifying recurrent episodes of atrial flutter does not appear to be something we should aggressively pursue. If we do find asymptomatic atrial flutter it would probably not change the course of therapy. Therefore I see no reason to go head with a loop recorder implantation. If the situation changes and he becomes a candidate for ablation therapy or anticoagulation, firm diagnostic evidence of atrial flutter would be critical in his care.

## 2013-02-24 NOTE — Assessment & Plan Note (Signed)
He already has some complaints of fatigue that I think her beta blocker related. Increasing the dose of beta blocker is not recommended. He has mild orthostatic hypotension symptoms which could be equally attributed to carvedilol and Uroxatral.

## 2013-02-24 NOTE — Progress Notes (Signed)
Patient ID: Sean Stanley, male   DOB: 12/04/1940, 72 y.o.   MRN: [id number]     Reason for office visit Atrial flutter, palpitations  Sean Stanley underwent namely invasive mitral valve annuloplasty repair roughly 12 years ago at [facility]. He has normal left ventricular systolic function and no evidence of coronary disease by angiography. He presented with symptomatic atrial flutter requiring a TEE guided cardioversion in November of 2013. 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 He was on anticoagulation with xarelto at that time. He has not had confirmed recurrent atrial flutter since then. He was referred for electrophysiology evaluation. He was concerned that his atrial flutter might not be typical counterclockwise caval tricuspid isthmus flutter but in atriotomy related arrhythmia. Therefore after being seen by Dr. Graciela Husbands, Dr. Johney Frame offered a second opinion. The decision was made that he does not need ablation therapy at this time. They also recommended that he does not need chronic anticoagulation since his embolic risk is low. Last month he returned with palpitations. His event monitor has only shown PACs and PVCs. He only perceives these in the evening when he goes to bed, but both he and his wife have often detected irregular beats by checking his pulse. His aneurysm was successfully occluded. He is scheduled for a brain MRI to exclude aneurysms later this week.   Allergies  Allergen Reactions  . Crestor [Rosuvastatin] Other (See Comments)    Other reaction(s): Muscle Pain Muscle/chest pain Muscle/chest pain  . Ampicillin Rash  . Penicillins Rash    Current Outpatient Prescriptions  Medication Sig Dispense Refill  . alfuzosin (UROXATRAL) 10 MG 24 hr tablet Take 10 mg by mouth daily.      . carvedilol (COREG) 6.25 MG tablet Take 9.375 mg by mouth 2 (two) times daily with a meal.        . latanoprost (XALATAN) 0.005 % ophthalmic solution Place 1 drop into both eyes at bedtime.       No current facility-administered medications for this visit.    Past Medical History  Diagnosis Date  . Atrial flutter   . Hypertension   . Hypercholesteremia   . Mitral valve disease     annuloplasty 2002 [facility]  . Carotid stenosis     03/10/2007 right & left  bulbs & ICAs 0-49% reduction  ,right subclavian 50% reduction    Past Surgical History  Procedure Laterality Date  . Mitral valve repair  2002    [facility]  . Tee without cardioversion  02/12/2012    Procedure: TRANSESOPHAGEAL ECHOCARDIOGRAM (TEE);  Surgeon: [name], MD;  Location: [facility] ENDOSCOPY;  Service: Cardiovascular;  Laterality: N/A;  . Cardioversion  02/12/2012    Procedure: CARDIOVERSION;  Surgeon: [name], MD;  Location: [facility] ENDOSCOPY;  Service: Cardiovascular;  Laterality: N/A;  . Right heart cath  06/19/2004    normal right heart dynamics. EF 50%  . Nm myoview ltd  07/22/2006    no ischemia  . Abdominal angiogram  08/05/12    Family History  Problem Relation Age of Onset  . Parkinson's disease Mother 22  . Heart failure Father 65  . Cancer Maternal Grandmother   . Heart attack Maternal Grandfather   . Heart attack Paternal Grandmother   . Heart attack Paternal Grandfather     History   Social History  . Marital Status: Married    Spouse Name: N/A  Number of Children: N/A  . Years of Education: N/A   Occupational History  . Not on file.   Social History Main Topics  . Smoking status: Never Smoker   . Smokeless tobacco: Never Used  . Alcohol Use: Yes     Comment: social  . Drug Use: No  . Sexual Activity: Not on file   Other Topics Concern  . Not on file   Social History Narrative   Lives in [place], Retired    Review of systems: He has fatigue and mild orthostatic dizziness as well as palpitations (these do not correlate with his other symptoms) The patient specifically  denies any chest pain at rest or with exertion, dyspnea at rest or with exertion, orthopnea, paroxysmal nocturnal dyspnea, syncope, focal neurological deficits, intermittent claudication, lower extremity edema, unexplained weight gain, cough, hemoptysis or wheezing.  The patient also denies abdominal pain, nausea, vomiting, dysphagia, diarrhea, constipation, polyuria, polydipsia, dysuria, hematuria, frequency, urgency, abnormal bleeding or bruising, fever, chills, unexpected weight changes, mood swings, change in skin or hair texture, change in voice quality, auditory or visual problems, allergic reactions or rashes, new musculoskeletal complaints other than usual "aches and pains".   PHYSICAL EXAM BP 112/64  Pulse 80  Resp 16  Ht 6' 1.5" (1.867 m)  Wt 215 lb 1.6 oz (97.569 kg)  BMI 27.99 kg/m2  General: Alert, oriented x3, no distress Head: no evidence of trauma, PERRL, EOMI, no exophtalmos or lid lag, no myxedema, no xanthelasma; normal ears, nose and oropharynx Neck: normal jugular venous pulsations and no hepatojugular reflux; brisk carotid pulses without delay and no carotid bruits Chest: clear to auscultation, no signs of consolidation by percussion or palpation, normal fremitus, symmetrical and full respiratory excursions Cardiovascular: normal position and quality of the apical impulse, regular rhythm with occasional ectopic beat, normal first and second heart sounds, no murmurs, rubs or gallops Abdomen: no tenderness or distention, no masses by palpation, no abnormal pulsatility or arterial bruits, normal bowel sounds, no hepatosplenomegaly Extremities: no clubbing, cyanosis or edema; 2+ radial, ulnar and brachial pulses bilaterally; 2+ right femoral, posterior tibial and dorsalis pedis pulses; 2+ left femoral, posterior tibial and dorsalis pedis pulses; no subclavian or femoral bruits Neurological: grossly nonfocal   EKG: NSR  Lipid Panel  No results found for this basename: chol,  trig, hdl, cholhdl, vldl, ldlcalc    BMET    Component Value Date/Time   NA 139 08/11/2012 0405   K 4.1 08/11/2012 0405   CL 103 08/11/2012 0405   CO2 28 08/11/2012 0405   GLUCOSE 104* 08/11/2012 0405   BUN 17 08/11/2012 0405   CREATININE 1.00 08/11/2012 0405   CALCIUM 9.0 08/11/2012 0405   GFRNONAA 74* 08/11/2012 0405   GFRAA 85* 08/11/2012 0405     ASSESSMENT AND PLAN Nontraumatic retroperitoneal hematoma, spontaneous while on anticoagulant therapy 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/ring by Dr Silvestre Mesi at [facility] '02 He has good long-term results from previous mitral valve annuloplasty. There is no significant residual mitral valve abnormality. It is much more likely that his arrhythmia is related to the scar of previous atriotomy then 2 valvular heart disease.  Atrial flutter, status post TE guided cardioversion November 2013 He has only had one  confirmed episode of sustained atrial flutter. At this point in time there is no evidence of recurrence. His palpitations are due to isolated PACs and PVCs. He has been evaluated by two different electrophysiologists. They both felt that his risk of embolic events is low. They recommended against anticoagulation and did not recommend ablation therapy. Under these circumstances, identifying recurrent episodes of atrial flutter does not appear to be something we should aggressively pursue. If we do find asymptomatic atrial flutter it would probably not change the course of therapy. Therefore I see no reason to go head with a loop recorder implantation. If the situation changes and he becomes a candidate for ablation therapy or anticoagulation, firm diagnostic evidence of atrial flutter  would be critical in his care.  Bradycardia- HR 58 NSR on admission 08/04/12 He already has some complaints of fatigue that I think her beta blocker related. Increasing the dose of beta blocker is not recommended. He has mild orthostatic hypotension symptoms which could be equally attributed to carvedilol and Uroxatral.   Junious Silk, MD, [facility] [phone] office [phone] pager

## 2013-02-24 NOTE — Patient Instructions (Signed)
Your physician recommends that you schedule a follow-up appointment in: 3 months.  

## 2013-02-24 NOTE — Assessment & Plan Note (Signed)
He has good long-term results from previous mitral valve annuloplasty. There is no significant residual mitral valve abnormality. It is much more likely that his arrhythmia is related to the scar of previous atriotomy then 2 valvular heart disease.

## 2013-02-26 DIAGNOSIS — I722 Aneurysm of renal artery: Secondary | ICD-10-CM | POA: Diagnosis not present

## 2013-02-26 DIAGNOSIS — S3681XA Injury of peritoneum, initial encounter: Secondary | ICD-10-CM | POA: Diagnosis not present

## 2013-02-26 DIAGNOSIS — I728 Aneurysm of other specified arteries: Secondary | ICD-10-CM | POA: Diagnosis not present

## 2013-02-26 DIAGNOSIS — R58 Hemorrhage, not elsewhere classified: Secondary | ICD-10-CM | POA: Diagnosis not present

## 2013-02-26 DIAGNOSIS — I714 Abdominal aortic aneurysm, without rupture, unspecified: Secondary | ICD-10-CM | POA: Diagnosis not present

## 2013-02-26 DIAGNOSIS — I619 Nontraumatic intracerebral hemorrhage, unspecified: Secondary | ICD-10-CM | POA: Diagnosis not present

## 2013-02-26 DIAGNOSIS — I671 Cerebral aneurysm, nonruptured: Secondary | ICD-10-CM | POA: Diagnosis not present

## 2013-02-26 DIAGNOSIS — K661 Hemoperitoneum: Secondary | ICD-10-CM | POA: Diagnosis not present

## 2013-03-06 ENCOUNTER — Encounter (HOSPITAL_COMMUNITY): Payer: Self-pay | Admitting: Emergency Medicine

## 2013-03-06 ENCOUNTER — Emergency Department (HOSPITAL_COMMUNITY): Payer: Medicare Other

## 2013-03-06 ENCOUNTER — Emergency Department (HOSPITAL_COMMUNITY)
Admission: EM | Admit: 2013-03-06 | Discharge: 2013-03-06 | Disposition: A | Discharge status: Home / Self Care | Payer: Medicare Other | Attending: Emergency Medicine | Admitting: Emergency Medicine

## 2013-03-06 DIAGNOSIS — S301XXA Contusion of abdominal wall, initial encounter: Secondary | ICD-10-CM | POA: Diagnosis not present

## 2013-03-06 DIAGNOSIS — Z9889 Other specified postprocedural states: Secondary | ICD-10-CM | POA: Insufficient documentation

## 2013-03-06 DIAGNOSIS — Z79899 Other long term (current) drug therapy: Secondary | ICD-10-CM | POA: Insufficient documentation

## 2013-03-06 DIAGNOSIS — Z8639 Personal history of other endocrine, nutritional and metabolic disease: Secondary | ICD-10-CM | POA: Insufficient documentation

## 2013-03-06 DIAGNOSIS — I714 Abdominal aortic aneurysm, without rupture, unspecified: Secondary | ICD-10-CM | POA: Diagnosis not present

## 2013-03-06 DIAGNOSIS — Z862 Personal history of diseases of the blood and blood-forming organs and certain disorders involving the immune mechanism: Secondary | ICD-10-CM | POA: Diagnosis not present

## 2013-03-06 DIAGNOSIS — I10 Essential (primary) hypertension: Secondary | ICD-10-CM | POA: Insufficient documentation

## 2013-03-06 DIAGNOSIS — Z880 Allergy status to penicillin: Secondary | ICD-10-CM | POA: Diagnosis not present

## 2013-03-06 DIAGNOSIS — R61 Generalized hyperhidrosis: Secondary | ICD-10-CM | POA: Diagnosis not present

## 2013-03-06 DIAGNOSIS — R112 Nausea with vomiting, unspecified: Secondary | ICD-10-CM | POA: Diagnosis not present

## 2013-03-06 DIAGNOSIS — R079 Chest pain, unspecified: Secondary | ICD-10-CM | POA: Diagnosis not present

## 2013-03-06 DIAGNOSIS — R1013 Epigastric pain: Secondary | ICD-10-CM | POA: Insufficient documentation

## 2013-03-06 DIAGNOSIS — IMO0001 Reserved for inherently not codable concepts without codable children: Secondary | ICD-10-CM

## 2013-03-06 DIAGNOSIS — R109 Unspecified abdominal pain: Secondary | ICD-10-CM

## 2013-03-06 LAB — CBC WITH DIFFERENTIAL/PLATELET
Basophils Absolute: 0 10*3/uL (ref 0.0–0.1)
Basophils Relative: 0 % (ref 0–1)
HCT: 42.1 % (ref 39.0–52.0)
Lymphocytes Relative: 7 % — ABNORMAL LOW (ref 12–46)
Lymphs Abs: 1 10*3/uL (ref 0.7–4.0)
MCHC: 34.4 g/dL (ref 30.0–36.0)
MCV: 88.4 fL (ref 78.0–100.0)
Monocytes Absolute: 0.7 10*3/uL (ref 0.1–1.0)
Neutro Abs: 12.4 10*3/uL — ABNORMAL HIGH (ref 1.7–7.7)
Neutrophils Relative %: 87 % — ABNORMAL HIGH (ref 43–77)
RDW: 13.5 % (ref 11.5–15.5)
WBC: 14.2 10*3/uL — ABNORMAL HIGH (ref 4.0–10.5)

## 2013-03-06 LAB — POCT I-STAT TROPONIN I: Troponin i, poc: 0 ng/mL (ref 0.00–0.08)

## 2013-03-06 LAB — COMPREHENSIVE METABOLIC PANEL
ALT: 21 U/L (ref 0–53)
AST: 28 U/L (ref 0–37)
Albumin: 3.8 g/dL (ref 3.5–5.2)
Alkaline Phosphatase: 57 U/L (ref 39–117)
CO2: 26 mEq/L (ref 19–32)
Chloride: 102 mEq/L (ref 96–112)
GFR calc non Af Amer: 64 mL/min — ABNORMAL LOW (ref >90)
Potassium: 3.8 mEq/L (ref 3.5–5.1)
Sodium: 139 mEq/L (ref 135–145)
Total Bilirubin: 1.1 mg/dL (ref 0.3–1.2)

## 2013-03-06 LAB — POCT I-STAT, CHEM 8
BUN: 40 mg/dL — ABNORMAL HIGH (ref 6–23)
Calcium, Ion: 1.17 mmol/L (ref 1.13–1.30)
Chloride: 104 mEq/L (ref 96–112)
Creatinine, Ser: 1.2 mg/dL (ref 0.50–1.35)
Glucose, Bld: 106 mg/dL — ABNORMAL HIGH (ref 70–99)
Hemoglobin: 15.3 g/dL (ref 13.0–17.0)
Sodium: 142 mEq/L (ref 135–145)
TCO2: 27 mmol/L (ref 0–100)

## 2013-03-06 MED ORDER — ONDANSETRON 8 MG PO TBDP: 8.0000 mg | ORAL_TABLET | Freq: Three times a day (TID) | ORAL | Status: DC | PRN

## 2013-03-06 MED ORDER — HYDROMORPHONE HCL PF 1 MG/ML IJ SOLN
0.5000 mg | INTRAMUSCULAR | Status: AC
  Administered 2013-03-06: 0.5 mg via INTRAVENOUS
  Filled 2013-03-06: qty 1

## 2013-03-06 MED ORDER — IOHEXOL 350 MG/ML SOLN
100.0000 mL | Freq: Once | INTRAVENOUS | Status: AC | PRN
  Administered 2013-03-06: 100 mL via INTRAVENOUS

## 2013-03-06 NOTE — ED Notes (Signed)
EKG given to EDP,Campos,MD. For review. 

## 2013-03-06 NOTE — ED Notes (Signed)
Pt states he awoke @ 0300 with substernal dull but severe pain, emesis x 8. Pt recently has aneurysm repair at [facility], Spleen coiled, pancreatic aneurysm ruptured. Several others exist.

## 2013-03-06 NOTE — ED Notes (Signed)
Patient transported to CT 

## 2013-03-06 NOTE — ED Provider Notes (Signed)
CSN: [id number]     Arrival date & time 03/06/13  0610 History   First MD Initiated Contact with Patient 03/06/13 [phone]     Chief Complaint  Patient presents with  . Chest Pain   (Consider location/radiation/quality/duration/timing/severity/associated sxs/prior Treatment) HPI Sean Stanley is a 72 y.o. male who presents to ED with complaint of sudden onset of epigastric/retrosternal chest pain. States was awoken from sleep around 3 am with what he describes as "tight squeezing" pain. States he was diaphoretic, nauseated, vomited about 4-5 times. States laid in bed with heating pad which improved his pain. States currently pain is minimal. Denies Hx of CAD. Hx of aflutter, mitral valve repair in 2002. 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 States has been following up at [facility] and had an embolization of his spelin aneurism several months ago. Was told there are several small aneurysm present but at this time, not requiring treatment. Pt is concerned about potential bleed.    Past Medical History  Diagnosis Date  . Atrial flutter   . Hypertension   . Hypercholesteremia   . Mitral valve disease     annuloplasty 2002 [facility]  . Carotid stenosis     03/10/2007 right & left  bulbs & ICAs 0-49% reduction  ,right subclavian 50% reduction   Past Surgical History  Procedure Laterality Date  . Mitral valve repair  2002    [facility]  . Tee without cardioversion  02/12/2012    Procedure: TRANSESOPHAGEAL ECHOCARDIOGRAM (TEE);  Surgeon: [name], MD;  Location: [facility] ENDOSCOPY;  Service: Cardiovascular;  Laterality: N/A;  . Cardioversion  02/12/2012    Procedure: CARDIOVERSION;  Surgeon: [name], MD;  Location: [facility] ENDOSCOPY;  Service: Cardiovascular;  Laterality: N/A;  . Right heart cath  06/19/2004    normal right heart dynamics. EF 50%  . Nm myoview ltd  07/22/2006    no ischemia  .  Abdominal angiogram  08/05/12  . Aneurysm repair     Family History  Problem Relation Age of Onset  . Parkinson's disease Mother 20  . Heart failure Father 92  . Cancer Maternal Grandmother   . Heart attack Maternal Grandfather   . Heart attack Paternal Grandmother   . Heart attack Paternal Grandfather    History  Substance Use Topics  . Smoking status: Never Smoker   . Smokeless tobacco: Never Used  . Alcohol Use: Yes     Comment: social    Review of Systems  Constitutional: Positive for diaphoresis. Negative for fever and chills.  Respiratory: Negative for cough, chest tightness and shortness of breath.   Cardiovascular: Positive for chest pain. Negative for palpitations and leg swelling.  Gastrointestinal: Positive for nausea, vomiting and abdominal pain. Negative for diarrhea and abdominal distention.  Genitourinary: Negative for dysuria, urgency, frequency and hematuria.  Musculoskeletal: Negative for arthralgias, myalgias, neck pain and neck stiffness.  Skin: Negative for rash.  Allergic/Immunologic: Negative for immunocompromised state.  Neurological: Negative for dizziness, weakness, light-headedness, numbness and headaches.  All other systems reviewed and are negative.    Allergies  Crestor; Ampicillin; and Penicillins  Home Medications   Current Outpatient Rx  Name  Route  Sig  Dispense  Refill  . alfuzosin (UROXATRAL) 10 MG 24 hr tablet   Oral   Take 10 mg by mouth daily.         . carvedilol (COREG) 6.25 MG tablet  Oral   Take 9.375 mg by mouth 2 (two) times daily with a meal.          . latanoprost (XALATAN) 0.005 % ophthalmic solution   Both Eyes   Place 1 drop into both eyes at bedtime.          BP 127/61  Pulse 57  Resp 16  Ht 6\' 1"  (1.854 m)  Wt 210 lb (95.255 kg)  BMI 27.71 kg/m2  SpO2 99% Physical Exam  Nursing note and vitals reviewed. Constitutional: He appears well-developed and well-nourished. No distress.  HENT:  Head:  Normocephalic and atraumatic.  Eyes: Conjunctivae are normal.  Neck: Neck supple.  Cardiovascular: Normal rate, regular rhythm and normal heart sounds.   DP pulses intact bilaterally  Pulmonary/Chest: Effort normal. No respiratory distress. He has no wheezes. He has no rales.  Abdominal: Soft. Bowel sounds are normal. He exhibits no distension. There is no rebound.  Epigastric tenderness, no guarding  Musculoskeletal: He exhibits no edema.  Neurological: He is alert.  Skin: Skin is warm and dry.  Psychiatric: He has a normal mood and affect. His behavior is normal.    ED Course  Procedures (including critical care time) Labs Review Labs Reviewed  CBC WITH DIFFERENTIAL - Abnormal; Notable for the following:    WBC 14.2 (*)    Neutrophils Relative % 87 (*)    Neutro Abs 12.4 (*)    Lymphocytes Relative 7 (*)    All other components within normal limits  COMPREHENSIVE METABOLIC PANEL - Abnormal; Notable for the following:    Glucose, Bld 112 (*)    BUN 28 (*)    GFR calc non Af Amer 64 (*)    GFR calc Af Amer 74 (*)    All other components within normal limits  LIPASE, BLOOD - Abnormal; Notable for the following:    Lipase 63 (*)    All other components within normal limits  POCT I-STAT, CHEM 8 - Abnormal; Notable for the following:    BUN 40 (*)    Glucose, Bld 106 (*)    All other components within normal limits  CG4 I-STAT (LACTIC ACID)  POCT I-STAT TROPONIN I  POCT I-STAT TROPONIN I   Imaging Review Dg Chest Portable 1 View  03/06/2013   CLINICAL DATA:  Chest pain  EXAM: PORTABLE CHEST - 1 VIEW  COMPARISON:  08/10/2012  FINDINGS: Cardiac shadow remains enlarged. The lungs are clear bilaterally. Postsurgical changes are noted in the anterior aspect of the right 4th rib.  IMPRESSION: No acute abnormality noted.   Electronically Signed   By: Alcide Clever M.D.   On: 03/06/2013 07:24   Ct Cta Abd/pel W/cm &/or W/o Cm  03/06/2013   CLINICAL DATA:  Known visceral artery  aneurysms  EXAM: CT ANGIOGRAPHY ABDOMEN AND PELVIS WITH CONTRAST AND WITHOUT CONTRAST  TECHNIQUE: Multidetector CT imaging of the abdomen and pelvis was performed using the standard protocol during bolus administration of intravenous contrast. Multiplanar reconstructed images including MIPs were obtained and reviewed to evaluate the vascular anatomy.  CONTRAST:  OMNIPAQUE IOHEXOL 350 MG/ML SOLN Multiple IV infiltrates occurred despite multiple different IV sites.  COMPARISON:  09/21/2012, 08/05/2012  FINDINGS: Lung bases are free of acute infiltrate or sizable effusion. The cardiac structures as visualized are within normal limits.  The liver, spleen, adrenal glands and pancreas are all normal in their CT appearance. The gallbladder is dilated and the common bile duct is prominent although likely within normal limits  for the patient's given age.  The kidneys demonstrate a normal enhancement pattern. Delayed images demonstrate adequate excretion of contrast. Both parapelvic and cortical cysts are noted.  There are a remains a large retroperitoneal fluid collection anterior to the IVC and right kidney which measures 8.9 x 5.8 cm in greatest dimension. It previously measured 14 x 14 cm. . This again likely represents a resolving hematoma.  Diverticular change of the colon is noted. No diverticulitis is seen. The appendix is well visualized and without focal abnormality. The bladder is partially distended with the contrast opacified urine. No pelvic mass lesion or sidewall abnormality is noted.  Vascular structures: The abdominal aorta is well visualized and shows no evidence of aneurysmal dilatation. The celiac axis, superior mesenteric artery and inferior mesenteric artery are all patent although compression on the celiac axis is again identified secondary to median arcuate ligament compression. Single renal arteries is identified on the left. Dual renal arteries are noted on the right with the inferior pole  renal artery arising just above the aortic bifurcation. The iliac arteries are within normal limits bilaterally.  The previously seen splenic artery pseudoaneurysm has been coiled in the interval. The splenic artery is patent. 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  Degenerative changes of the lumbar spine are seen.  Review of the MIP images confirms the above findings.  IMPRESSION: Status post coiling of a splenic artery pseudoaneurysm with the residual patency of the splenic artery.  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  Large retroperitoneal fluid collection which appears to cause some mass effect upon the common bile duct causing dilatation. This has decreased in size as described when compared with the prior exam.  Diverticulosis without diverticulitis.  No new focal abnormality is seen.   Electronically Signed   By: Alcide Clever M.D.   On: 03/06/2013 14:00    EKG Interpretation    Date/Time:  Saturday March 06 2013 06:18:35 EST Ventricular Rate:  58 PR Interval:  185 QRS Duration: 119 QT Interval:  451 QTC Calculation: 443 R Axis:   29 Text Interpretation:  Sinus rhythm Nonspecific intraventricular conduction delay No significant change since last tracing Confirmed by HARRISON  MD, FORREST ([id number]) on 03/06/2013 9:08:21 AM            MDM   1. Abdominal pain   2. Intra-abdominal hematoma, sequela     6:45 AM Patient with sudden onset of abdominal pain, diaphoresis, nausea, vomiting. Patient awoken him up from sleep. He does have history of intra-abdominal bleed while on several to just 8 months ago. He is worried that this could be a similar event. Will get lab work, CT angiogram abdomen and pelvis. Patient recently had one done at [facility] just 5 days ago, showed continuous right retroperitoneal hematoma which is improving in size.    9:10 AM Labs all unremarkable, hemoglobin is  normal. There is a delay in obtaining a CT, patient's IVs too small. Nurses placed an IV line. Will try again.    9:50 AM IV infiltrated during a CT scan in administration of contrast material. Swelling is minimal. Ice pack applied. Will need close monitoring for the area. Dr. Romeo Apple to place another IV. Troponin recheck. Patient's pain at this time completely resolved, he is comfortable, no distress.  11:40 AM Patient was taken back to CT scan with a new IV placed by Dr. Romeo Apple. IV was not working well and CT scanner, able  to pull bad blood but unable to push contrast through. We'll bring him back to the room and try another IV site. Patient continues to have stable vital signs, no pain. Second troponin back negative.  14:36 PM Patient finally got another IV and another try for CT scan. The results back showing persistent retroperitoneal hematoma that is decreasing in size. No other findings to explain his pain at this time. His pain has been resolved almost entire time in emergency department. His vital signs have been normal.  he felt a troponin is negative. He has no complaints. At this time I think it is safe to discharge patient home with close followup.  Filed Vitals:   03/06/13 0614 03/06/13 0618 03/06/13 1123 03/06/13 1438  BP:  127/61 119/72 135/73  Pulse:  57  65  Temp:   98.1 F (36.7 C)   Resp:  16  18  Height: 6\' 1"  (1.854 m)     Weight: 210 lb (95.255 kg)     SpO2:  99% 98% 99%     Welles Walthall A Paetyn Pietrzak, PA-C 03/06/13 1520

## 2013-03-06 NOTE — ED Notes (Signed)
MD at bedside. 

## 2013-03-06 NOTE — ED Provider Notes (Signed)
Medical screening examination/treatment/procedure(s) were conducted as a shared visit with non-physician practitioner(s) and myself.  I personally evaluated the patient during the encounter.  EKG Interpretation    Date/Time:  Saturday March 06 2013 06:18:35 EST Ventricular Rate:  58 PR Interval:  185 QRS Duration: 119 QT Interval:  451 QTC Calculation: 443 R Axis:   29 Text Interpretation:  Sinus rhythm Nonspecific intraventricular conduction delay No significant change since last tracing Confirmed by Mardene Lessig  MD, Linzee Depaul ([id number]) on 03/06/2013 9:08:21 AM            I interviewed and examined the patient. Lungs are CTAB. Cardiac exam wnl. Abdomen soft. Pt required multiple IV's to get CT scan of abdomen. Had infiltration of initial contrast bolus in LUE. He remains neurovascularly intact in the LUE w/out any evidence of compartment syndrome and a soft LUE.  CT is neg. Mildly elev lipase likely related to vomiting. No significant change in LFT's/bili. Pain could still be related to GB given compression by hematoma. Will rec outpt RUQ [place] as pt remains asx here and continues to appear well.   Procedure note: Ultrasound Guided Peripheral IV Ultrasound guided 20 g peripheral 1.88 inch angiocath IV placement performed by me. Indications: Nursing unable to place IV, required for CT. Details: The right antecubital fossa and upper arm was evaluated with a multifrequency linear probe. Several patent brachial veins are noted. 1 attempts were made to cannulate a right antecubital vein under realtime US guidance with successful cannulation of the vein and catheter placement. There is return of non-pulsatile dark red blood. The patient tolerated the procedure well without complications.   Procedure note: Ultrasound Guided Peripheral IV Ultrasound guided 18 g peripheral 1.88 inch angiocath IV placement performed by me. Indications: Nursing unable to place IV, required for CT. Details: The  right  antecubital fossa and upper arm was evaluated with a multifrequency linear probe. Several patent brachial veins are noted. 1 attempts were made to cannulate a right basilic vein under realtime US guidance with successful cannulation of the vein and catheter placement. There is return of non-pulsatile dark red blood. The patient tolerated the procedure well without complications.     Junius Argyle, MD 03/06/13 1536

## 2013-03-08 ENCOUNTER — Other Ambulatory Visit: Payer: Self-pay | Admitting: Internal Medicine

## 2013-03-08 DIAGNOSIS — R1013 Epigastric pain: Secondary | ICD-10-CM | POA: Diagnosis not present

## 2013-03-08 DIAGNOSIS — R109 Unspecified abdominal pain: Secondary | ICD-10-CM

## 2013-03-10 ENCOUNTER — Ambulatory Visit
Admission: RE | Admit: 2013-03-10 | Discharge: 2013-03-10 | Disposition: A | Discharge status: Home / Self Care | Payer: Medicare Other | Source: Ambulatory Visit | Attending: Internal Medicine | Admitting: Internal Medicine

## 2013-03-10 DIAGNOSIS — R109 Unspecified abdominal pain: Secondary | ICD-10-CM

## 2013-03-10 DIAGNOSIS — K8020 Calculus of gallbladder without cholecystitis without obstruction: Secondary | ICD-10-CM | POA: Diagnosis not present

## 2013-03-30 ENCOUNTER — Ambulatory Visit (INDEPENDENT_AMBULATORY_CARE_PROVIDER_SITE_OTHER): Payer: Medicare Other | Admitting: General Surgery

## 2013-03-30 VITALS — BP 114/64 | HR 70 | Temp 98.0°F | Resp 18 | Ht 73.5 in | Wt 212.0 lb

## 2013-03-30 DIAGNOSIS — K8020 Calculus of gallbladder without cholecystitis without obstruction: Secondary | ICD-10-CM | POA: Diagnosis not present

## 2013-03-30 NOTE — Patient Instructions (Signed)
You have gallstones, and therefore you have a diseased gallbladder.  The episode of severe upper abdominal pain and vomiting in December was probably a gallbladder attack. 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  There is no emergency, but you should consider elective laparoscopic cholecystectomy with cholangiogram.  You will call Dr. Darrel Hoover office back when you have decided that you want to schedule the surgery.     Laparoscopic Cholecystectomy Laparoscopic cholecystectomy is surgery to remove the gallbladder. The gallbladder is located slightly to the right of center in the abdomen, behind the liver. It is a concentrating and storage sac for the bile produced in the liver. Bile aids in the digestion and absorption of fats. Gallbladder disease (cholecystitis) is an inflammation of your gallbladder. This condition is usually caused by a buildup of gallstones (cholelithiasis) in your gallbladder. Gallstones can block the flow of bile, resulting in inflammation and pain. In severe cases, emergency surgery may be required. When emergency surgery is not required, you will have time to prepare for the procedure. Laparoscopic surgery is an alternative to open surgery. Laparoscopic surgery usually has a shorter recovery time. Your common bile duct may also need to be examined and explored. Your caregiver will discuss this with you if he or she feels this should be done. If stones are found in the common bile duct, they may be removed. LET YOUR CAREGIVER KNOW ABOUT:  Allergies to food or medicine.  Medicines taken, including vitamins, herbs, eyedrops, over-the-counter medicines, and creams.  Use of steroids (by mouth or creams).  Previous problems with anesthetics or numbing medicines.  History of bleeding problems or blood clots.  Previous surgery.  Other health problems, including diabetes and kidney  problems.  Possibility of pregnancy, if this applies. RISKS AND COMPLICATIONS All surgery is associated with risks. Some problems that may occur following this procedure include:  Infection.  Damage to the common bile duct, nerves, arteries, veins, or other internal organs such as the stomach or intestines.  Bleeding.  A stone may remain in the common bile duct. BEFORE THE PROCEDURE  Do not take aspirin for 3 days prior to surgery or blood thinners for 1 week prior to surgery.  Do not eat or drink anything after midnight the night before surgery.  Let your caregiver know if you develop a cold or other infectious problem prior to surgery.  You should be present 60 minutes before the procedure or as directed. PROCEDURE  You will be given medicine that makes you sleep (general anesthetic). When you are asleep, your surgeon will make several small cuts (incisions) in your abdomen. One of these incisions is used to insert a small, lighted scope (laparoscope) into the abdomen. The laparoscope helps the surgeon see into your abdomen. Carbon dioxide gas will be pumped into your abdomen. The gas allows more room for the surgeon to perform your surgery. Other operating instruments are inserted through the other incisions. Laparoscopic procedures may not be appropriate when:  There is major scarring from previous surgery.  The gallbladder is extremely inflamed.  There are bleeding disorders or unexpected cirrhosis of the liver.  A pregnancy is near term.  Other conditions make the laparoscopic procedure impossible. If your surgeon feels it is not safe to continue with a laparoscopic procedure, he or she will perform an open abdominal procedure. In this case, the surgeon will make an incision to open the abdomen. This gives the  surgeon a larger view and field to work within. This may allow the surgeon to perform procedures that sometimes cannot be performed with a laparoscope alone. Open  surgery has a longer recovery time. AFTER THE PROCEDURE  You will be taken to the recovery area where a nurse will watch and check your progress.  You may be allowed to go home the same day.  Do not resume physical activities until directed by your caregiver.  You may resume a normal diet and activities as directed. Document Released: 03/11/2005 Document Revised: 06/03/2011 Document Reviewed: 10/21/2012 [facility] Patient Information 2014 [employer].

## 2013-03-30 NOTE — Progress Notes (Addendum)
Patient ID: Sean Stanley, male   DOB: 05/20/1940, 73 y.o.   MRN: [id number]  Chief Complaint  Patient presents with  . New Evaluation    G.B    HPI Sean Stanley is a 73 y.o. male.  He is referred by Dr. Veleta Miners for evaluation and management of gallstones. Dr. Sallyanne Kuster is his cardiologist. Dr. Caryl Comes is his EP physician.  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 He has been evaluated by Dr. Trula Slade here in town. 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 He was found to have a retroduodenal retroperitoneal hematoma but no active extravasation. Presumably his pancreaticoduodenal aneurysm ruptured and then thrombosed. He was on xaralto at that time but that has now been permanently discontinued and he is on no anticoagulants. He became essentially asymptomatic.   On December 13 he developed severe upper abdominal pain, similar to his pain from his bleed and repeated vomiting. He went to the emergency department. Cardiac workup was negative. CT scan showed that his periduodenal hematoma was stable. Imaging findings consistent with coiling of the splenic artery aneurysm were noted. There was no active bleeding.Ultrasound showed multiple gallstones and a stable hematoma in the right upper quadrant. I inspected  these films and the hematoma is between the inferior vena cava and duodenum and just below the infundibulum of the gallbladder. He has been asymptomatic since that time. He saw Dr. Deforest Hoyles who referred him to me with a diagnosis of  gallbladder attack. That makes reasonable sense since  the hematoma has been stable for 6 months, here is no recurrent bleeding, and he does have gallstones.  Significant comorbidities other than the visceral  artery aneurysms is a history of atrial flutter, cardioversion attempted by Dr. Caryl Comes in the past. Previously on antiplatelet medication but now off. History of mitral valve annuloplasty at [facility] in 2002. 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 No history of liver disease. No history of myocardial infarction. HPI  Past Medical History  Diagnosis Date  . Atrial flutter   . Hypertension   . Hypercholesteremia   . Mitral valve disease     annuloplasty 2002 [facility]  . Carotid stenosis     03/10/2007 right & left  bulbs & ICAs 0-49% reduction  ,right subclavian 50% reduction    Past Surgical History  Procedure Laterality Date  . Mitral valve repair  2002    [facility]  . Tee without cardioversion  02/12/2012    Procedure: TRANSESOPHAGEAL ECHOCARDIOGRAM (TEE);  Surgeon: Pixie Casino, MD;  Location: [facility] ENDOSCOPY;  Service: Cardiovascular;  Laterality: N/A;  . Cardioversion  02/12/2012    Procedure: CARDIOVERSION;  Surgeon: Pixie Casino, MD;  Location: [facility] ENDOSCOPY;  Service: Cardiovascular;  Laterality: N/A;  . Right heart cath  06/19/2004    normal right heart dynamics. EF 50%  . Nm myoview ltd  07/22/2006    no ischemia  . Abdominal angiogram  08/05/12  . Aneurysm repair      Family History  Problem Relation Age of Onset  . Parkinson's disease Mother 28  . Heart failure Father 37  . Cancer Maternal Grandmother   . Heart attack Maternal Grandfather   . Heart attack Paternal Grandmother   . Heart attack Paternal Grandfather     Social  History History  Substance Use Topics  . Smoking status: Never Smoker   . Smokeless tobacco: Never Used  . Alcohol Use: Yes     Comment: social    Allergies  Allergen Reactions  . Crestor [Rosuvastatin] Other (See Comments)    Other reaction(s): Muscle Pain Muscle/chest pain Muscle/chest pain  . Statins Other (See Comments)    Muscle aches  . Ampicillin Rash  . Penicillins Rash    Current Outpatient  Prescriptions  Medication Sig Dispense Refill  . alfuzosin (UROXATRAL) 10 MG 24 hr tablet Take 10 mg by mouth daily.      . carvedilol (COREG) 6.25 MG tablet Take 9.375 mg by mouth 2 (two) times daily with a meal.       . latanoprost (XALATAN) 0.005 % ophthalmic solution Place 1 drop into both eyes at bedtime.      . ondansetron (ZOFRAN ODT) 8 MG disintegrating tablet Take 1 tablet (8 mg total) by mouth every 8 (eight) hours as needed for nausea or vomiting.  20 tablet  0   No current facility-administered medications for this visit.    Review of Systems Review of Systems  Constitutional: Negative for fever, chills and unexpected weight change.  HENT: Negative for congestion, hearing loss, sore throat, trouble swallowing and voice change.   Eyes: Negative for visual disturbance.  Respiratory: Negative for cough, chest tightness, shortness of breath, wheezing and stridor.   Cardiovascular: Negative for chest pain, palpitations and leg swelling.  Gastrointestinal: Positive for nausea, vomiting and abdominal pain. Negative for diarrhea, constipation, blood in stool, abdominal distention, anal bleeding and rectal pain.  Genitourinary: Negative for hematuria and difficulty urinating.  Musculoskeletal: Negative for arthralgias.  Skin: Negative for rash and wound.  Neurological: Negative for seizures, syncope, weakness and headaches.  Hematological: Negative for adenopathy. Does not bruise/bleed easily.  Psychiatric/Behavioral: Negative for confusion.    Blood pressure 114/64, pulse 70, temperature 98 F (36.7 C), resp. rate 18, height 6' 1.5" (1.867 m), weight 212 lb (96.163 kg).  Physical Exam Physical Exam  Constitutional: He is oriented to person, place, and time. He appears well-developed and well-nourished. No distress.  Healthy appearing gentleman. Well dressed. Intelligent. Good insight. No distress. Wife is with him.  HENT:  Head: Normocephalic.  Nose: Nose normal.   Mouth/Throat: No oropharyngeal exudate.  Eyes: Conjunctivae and EOM are normal. Pupils are equal, round, and reactive to light. Right eye exhibits no discharge. Left eye exhibits no discharge. No scleral icterus.  Neck: Normal range of motion. Neck supple. No JVD present. No tracheal deviation present. No thyromegaly present.  Cardiovascular: Normal rate, regular rhythm, normal heart sounds and intact distal pulses.   No murmur heard. Pulmonary/Chest: Effort normal and breath sounds normal. No stridor. No respiratory distress. He has no wheezes. He has no rales. He exhibits no tenderness.  Abdominal: Soft. Bowel sounds are normal. He exhibits no distension and no mass. There is no tenderness. There is no rebound and no guarding.  Epigastric bruit, not mild but present.  Musculoskeletal: Normal range of motion. He exhibits no edema and no tenderness.  Lymphadenopathy:    He has no cervical adenopathy.  Neurological: He is alert and oriented to person, place, and time. He has normal reflexes. Coordination normal.  Skin: Skin is warm and dry. No rash noted. He is not diaphoretic. No erythema. No pallor.  Psychiatric: He has a normal mood and affect. His behavior is normal. Judgment and thought content normal.    Data Reviewed  Extensive hospital and outpatient and emergency department records. All imaging studies.  Assessment    Gallstones. I suspect that he had an attack of severe biliary colic on December 13. He is now asymptomatic, but he is at risk for future gallbladder attacks and complications.   Multiple visceral artery aneurysms.  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 Suspect aneurysm is now thrombosed. Hematoma has been stable and slowly resolving over the past 6 months. I doubt that this is causing his pain but this will, however, possibly complicate cholecystectomy due to  anatomical proximity  Celiac artery aneurysm, by patient report, followed by Dr. Trula Slade  History of mitral valve annuloplasty at [facility], 2002  Gilbert's syndrome  History atrial flutter  Chronic neuropathy  Noncritical carotid stenosis    Plan    We spent a very long time discussing the differential diagnosis of his pain, and the likelihood that this was gallbladder related. We spent a long time discussing the surgical approaches for cholecystectomy, describing laparoscopic cholecystectomy with cholangiogram in great detail. He knows that he is at increased risk for conversion to open operation if we cannot visualize the critical structures in the triangle of [place].  He is inclined to undergo laparoscopic cholecystectomy with IOC, , possible open in the future, but wants to go home and think about this and call me back when he is ready.  He will need to have a cardiac risk assessment by his cardiologist prior to this surgery ADDENDUM:  Dr. Sallyanne Kuster reviewed risk assessment and states:  " I do not think he is at major risk for serious cardiovascular complications with gallbladder surgery. Certainly, he could well develop atrial arrhythmia with any major surgery, but the risk of more serious complications, such as cardiac death, MI and stroke is low."  Certainly he would need to be observed overnight following the surgery.        Edsel Petrin. Dalbert Batman, M.D., [facility] General and Minimally invasive Surgery Breast and Colorectal Surgery Office:   [phone] Pager:   [phone]  03/30/2013, 5:30 PM

## 2013-04-13 DIAGNOSIS — K8020 Calculus of gallbladder without cholecystitis without obstruction: Secondary | ICD-10-CM | POA: Diagnosis not present

## 2013-04-20 DIAGNOSIS — H409 Unspecified glaucoma: Secondary | ICD-10-CM | POA: Diagnosis not present

## 2013-04-20 DIAGNOSIS — Z8679 Personal history of other diseases of the circulatory system: Secondary | ICD-10-CM | POA: Insufficient documentation

## 2013-04-20 DIAGNOSIS — I517 Cardiomegaly: Secondary | ICD-10-CM | POA: Diagnosis not present

## 2013-04-20 DIAGNOSIS — Z9889 Other specified postprocedural states: Secondary | ICD-10-CM | POA: Diagnosis not present

## 2013-04-20 DIAGNOSIS — Z954 Presence of other heart-valve replacement: Secondary | ICD-10-CM | POA: Diagnosis not present

## 2013-04-20 DIAGNOSIS — I079 Rheumatic tricuspid valve disease, unspecified: Secondary | ICD-10-CM | POA: Diagnosis not present

## 2013-04-20 DIAGNOSIS — I059 Rheumatic mitral valve disease, unspecified: Secondary | ICD-10-CM | POA: Diagnosis not present

## 2013-04-20 DIAGNOSIS — R0602 Shortness of breath: Secondary | ICD-10-CM | POA: Diagnosis not present

## 2013-04-20 DIAGNOSIS — I509 Heart failure, unspecified: Secondary | ICD-10-CM | POA: Diagnosis not present

## 2013-04-20 DIAGNOSIS — Z01810 Encounter for preprocedural cardiovascular examination: Secondary | ICD-10-CM | POA: Diagnosis not present

## 2013-04-20 DIAGNOSIS — R9431 Abnormal electrocardiogram [ECG] [EKG]: Secondary | ICD-10-CM | POA: Diagnosis not present

## 2013-04-20 DIAGNOSIS — K8020 Calculus of gallbladder without cholecystitis without obstruction: Secondary | ICD-10-CM | POA: Diagnosis not present

## 2013-04-23 DIAGNOSIS — I728 Aneurysm of other specified arteries: Secondary | ICD-10-CM | POA: Diagnosis not present

## 2013-04-23 DIAGNOSIS — I4892 Unspecified atrial flutter: Secondary | ICD-10-CM | POA: Diagnosis not present

## 2013-04-23 DIAGNOSIS — I059 Rheumatic mitral valve disease, unspecified: Secondary | ICD-10-CM | POA: Diagnosis not present

## 2013-04-23 DIAGNOSIS — N400 Enlarged prostate without lower urinary tract symptoms: Secondary | ICD-10-CM | POA: Diagnosis not present

## 2013-04-23 DIAGNOSIS — K8020 Calculus of gallbladder without cholecystitis without obstruction: Secondary | ICD-10-CM | POA: Diagnosis not present

## 2013-04-23 DIAGNOSIS — K8010 Calculus of gallbladder with chronic cholecystitis without obstruction: Secondary | ICD-10-CM | POA: Diagnosis not present

## 2013-04-27 DIAGNOSIS — K8020 Calculus of gallbladder without cholecystitis without obstruction: Secondary | ICD-10-CM | POA: Diagnosis not present

## 2013-05-17 DIAGNOSIS — K8020 Calculus of gallbladder without cholecystitis without obstruction: Secondary | ICD-10-CM | POA: Diagnosis not present

## 2013-05-25 ENCOUNTER — Encounter: Payer: Self-pay | Admitting: Cardiovascular Disease

## 2013-05-25 ENCOUNTER — Ambulatory Visit (INDEPENDENT_AMBULATORY_CARE_PROVIDER_SITE_OTHER): Payer: Medicare Other | Admitting: Cardiovascular Disease

## 2013-05-25 VITALS — BP 128/62 | HR 80 | Resp 16 | Ht 73.0 in | Wt 214.8 lb

## 2013-05-25 DIAGNOSIS — E7800 Pure hypercholesterolemia, unspecified: Secondary | ICD-10-CM | POA: Diagnosis not present

## 2013-05-25 DIAGNOSIS — I729 Aneurysm of unspecified site: Secondary | ICD-10-CM | POA: Diagnosis not present

## 2013-05-25 DIAGNOSIS — I4892 Unspecified atrial flutter: Secondary | ICD-10-CM | POA: Diagnosis not present

## 2013-05-25 DIAGNOSIS — I059 Rheumatic mitral valve disease, unspecified: Secondary | ICD-10-CM

## 2013-05-25 DIAGNOSIS — Z23 Encounter for immunization: Secondary | ICD-10-CM | POA: Diagnosis not present

## 2013-05-25 DIAGNOSIS — K8020 Calculus of gallbladder without cholecystitis without obstruction: Secondary | ICD-10-CM | POA: Diagnosis not present

## 2013-05-25 DIAGNOSIS — I77810 Thoracic aortic ectasia: Secondary | ICD-10-CM | POA: Diagnosis not present

## 2013-05-25 DIAGNOSIS — Z1331 Encounter for screening for depression: Secondary | ICD-10-CM | POA: Diagnosis not present

## 2013-05-25 DIAGNOSIS — Z0000 Encounter for general adult medical examination without abnormal findings: Secondary | ICD-10-CM | POA: Diagnosis not present

## 2013-05-25 NOTE — Assessment & Plan Note (Signed)
Good long term surgical result

## 2013-05-25 NOTE — Progress Notes (Signed)
Patient ID: Sean Stanley, male   DOB: May 12, 1940, 73 y.o.   MRN: [id number]     Reason for office visit Atrial flutter  Since his last appointment, he has not had problems with sustained palpitations, dizziness, syncope, dyspnea or angina. He has been fairly inactive and has gained some weight. He had to undergo cholecystectomy, from which he recovered without complications.  Mr. Snodgrass and his wife remain concerned about his history of atrial arrhythmia and the potential risk for embolic events, as well as the risk of recurrent bleeding secondary to some residual visceral artery aneurysms.  Mr. Bona underwent minimally invasive mitral valve annuloplasty repair roughly 12 years ago at [facility]. He has normal left ventricular systolic function and no evidence of coronary disease by angiography. He presented with symptomatic atrial flutter requiring a TEE guided cardioversion in November of 2013. 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 He was on anticoagulation with xarelto at that time. He has not had confirmed recurrent atrial flutter since then. He was referred for electrophysiology evaluation. There was concern that his atrial flutter might not be typical counterclockwise cavotricuspid isthmus flutter but an atriotomy related arrhythmia. Therefore after being seen by Dr. Caryl Comes, Dr. Rayann Heman offered a second opinion. The decision was made that he does not need ablation therapy at this time. They also recommended that he does not need chronic anticoagulation since his embolic risk is low.    Allergies  Allergen Reactions  . Crestor [Rosuvastatin] Other (See Comments)    Other reaction(s): Muscle Pain Muscle/chest pain Muscle/chest pain  . Statins Other (See Comments)    Muscle aches  . Ampicillin Rash  . Penicillins Rash    Current Outpatient Prescriptions  Medication Sig Dispense  Refill  . alfuzosin (UROXATRAL) 10 MG 24 hr tablet Take 10 mg by mouth daily.      . carvedilol (COREG) 6.25 MG tablet Take 9.375 mg by mouth 2 (two) times daily with a meal.       . latanoprost (XALATAN) 0.005 % ophthalmic solution Place 1 drop into both eyes at bedtime.       No current facility-administered medications for this visit.    Past Medical History  Diagnosis Date  . Atrial flutter   . Hypertension   . Hypercholesteremia   . Mitral valve disease     annuloplasty 2002 [facility]  . Carotid stenosis     03/10/2007 right & left  bulbs & ICAs 0-49% reduction  ,right subclavian 50% reduction    Past Surgical History  Procedure Laterality Date  . Mitral valve repair  2002    [facility]  . Tee without cardioversion  02/12/2012    Procedure: TRANSESOPHAGEAL ECHOCARDIOGRAM (TEE);  Surgeon: Pixie Casino, MD;  Location: [facility] ENDOSCOPY;  Service: Cardiovascular;  Laterality: N/A;  . Cardioversion  02/12/2012    Procedure: CARDIOVERSION;  Surgeon: Pixie Casino, MD;  Location: [facility] ENDOSCOPY;  Service: Cardiovascular;  Laterality: N/A;  . Right heart cath  06/19/2004    normal right heart dynamics. EF 50%  . Nm myoview ltd  07/22/2006    no ischemia  . Abdominal angiogram  08/05/12  . Aneurysm repair      Family History  Problem Relation Age of Onset  . Parkinson's disease Mother 29  . Heart failure Father 32  . Cancer Maternal Grandmother   . Heart attack Maternal Grandfather   . Heart attack  Paternal Grandmother   . Heart attack Paternal Grandfather     History   Social History  . Marital Status: Married    Spouse Name: N/A    Number of Children: N/A  . Years of Education: N/A   Occupational History  . Not on file.   Social History Main Topics  . Smoking status: Never Smoker   . Smokeless tobacco: Never Used  . Alcohol Use: Yes     Comment: social  . Drug Use: No  . Sexual Activity: Not on file   Other Topics Concern  . Not on file   Social History Narrative     Lives in [place], Retired    Review of systems: The patient specifically denies any chest pain at rest or with exertion, dyspnea at rest or with exertion, orthopnea, paroxysmal nocturnal dyspnea, syncope, palpitations, focal neurological deficits, intermittent claudication, lower extremity edema, unexplained weight gain, cough, hemoptysis or wheezing.  The patient also denies abdominal pain, nausea, vomiting, dysphagia, diarrhea, constipation, polyuria, polydipsia, dysuria, hematuria, frequency, urgency, abnormal bleeding or bruising, fever, chills, unexpected weight changes, mood swings, change in skin or hair texture, change in voice quality, auditory or visual problems, allergic reactions or rashes, new musculoskeletal complaints other than usual "aches and pains".   PHYSICAL EXAM BP 128/62  Pulse 80  Resp 16  Ht 6\' 1"  (1.854 m)  Wt 97.433 kg (214 lb 12.8 oz)  BMI 28.35 kg/m2  General: Alert, oriented x3, no distress Head: no evidence of trauma, PERRL, EOMI, no exophtalmos or lid lag, no myxedema, no xanthelasma; normal ears, nose and oropharynx Neck: normal jugular venous pulsations and no hepatojugular reflux; brisk carotid pulses without delay and no carotid bruits Chest: clear to auscultation, no signs of consolidation by percussion or palpation, normal fremitus, symmetrical and full respiratory excursions Cardiovascular: normal position and quality of the apical impulse, regular rhythm, normal first and second heart sounds, no murmurs, rubs or gallops Abdomen: no tenderness or distention, no masses by palpation, no abnormal pulsatility or arterial bruits, normal bowel sounds, no hepatosplenomegaly Extremities: no clubbing, cyanosis or edema; 2+ radial, ulnar and brachial pulses bilaterally; 2+ right femoral, posterior tibial and dorsalis pedis pulses; 2+ left femoral, posterior tibial and dorsalis pedis pulses; no subclavian or femoral bruits Neurological: grossly  nonfocal   EKG: NSR  Lipid Panel  No results found for this basename: chol, trig, hdl, cholhdl, vldl, ldlcalc    BMET    Component Value Date/Time   NA 142 03/06/2013 0709   K 4.2 03/06/2013 0709   CL 104 03/06/2013 0709   CO2 26 03/06/2013 0706   GLUCOSE 106* 03/06/2013 0709   BUN 40* 03/06/2013 0709   CREATININE 1.20 03/06/2013 0709   CALCIUM 9.2 03/06/2013 0706   GFRNONAA 64* 03/06/2013 0706   GFRAA 74* 03/06/2013 0706     ASSESSMENT AND PLAN 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/ring by Dr Evelina Dun at [facility] '02 Good long term surgical result  Atrial flutter, status post TE guided cardioversion November 2013 Discussed the risk-benefit ratio of anticoagulants, clearly not in favor of aggressive therapy at the current time. Establishing the exact burden of arrhythmia would not make a difference in that decision process. A neurological or other embolic event would lead to a need to reassess the balance of risk and benefit.  In the absence of symptomatic events, ablation is not justified.  Weight loss/exercise is strongly recommended and discussed in detail. Holli Humbles, MD, [place] [phone]  office [phone] pager

## 2013-05-25 NOTE — Assessment & Plan Note (Signed)
Discussed the risk-benefit ratio of anticoagulants, clearly not in favor of aggressive therapy at the current time. Establishing the exact burden of arrhythmia would not make a difference in that decision process. A neurological or other embolic event would lead to a need to reassess the balance of risk and benefit.  In the absence of symptomatic events, ablation is not justified.

## 2013-05-25 NOTE — Patient Instructions (Signed)
Your physician recommends that you schedule a follow-up appointment in: 6 Months  

## 2013-06-03 ENCOUNTER — Ambulatory Visit (INDEPENDENT_AMBULATORY_CARE_PROVIDER_SITE_OTHER): Payer: Medicare Other | Admitting: Ophthalmology

## 2013-06-03 DIAGNOSIS — H43819 Vitreous degeneration, unspecified eye: Secondary | ICD-10-CM | POA: Diagnosis not present

## 2013-06-03 DIAGNOSIS — H33309 Unspecified retinal break, unspecified eye: Secondary | ICD-10-CM

## 2013-06-03 DIAGNOSIS — H4010X0 Unspecified open-angle glaucoma, stage unspecified: Secondary | ICD-10-CM

## 2013-06-03 DIAGNOSIS — H2510 Age-related nuclear cataract, unspecified eye: Secondary | ICD-10-CM | POA: Diagnosis not present

## 2013-06-10 ENCOUNTER — Telehealth: Payer: Self-pay | Admitting: *Deleted

## 2013-06-10 ENCOUNTER — Telehealth: Payer: Self-pay | Admitting: Cardiovascular Disease

## 2013-06-10 MED ORDER — CARVEDILOL 6.25 MG PO TABS: 9.3750 mg | ORAL_TABLET | Freq: Two times a day (BID) | ORAL | Status: DC

## 2013-06-10 NOTE — Telephone Encounter (Signed)
Refills sent to pharmacy. 

## 2013-06-10 NOTE — Telephone Encounter (Signed)
Pt called and said his Carvedilol was called to the wrong pharmacy. He wants it called to [facility] with #90 and with refills please.

## 2013-06-10 NOTE — Telephone Encounter (Signed)
Returned call.  No answer/voicemail. Refill(s) sent to pharmacy.

## 2013-06-10 NOTE — Telephone Encounter (Signed)
Pt was calling because at his last visit he forgot to to let Dr. Sallyanne Kuster that he needs his Carvedilol.   [place]

## 2013-06-18 DIAGNOSIS — N400 Enlarged prostate without lower urinary tract symptoms: Secondary | ICD-10-CM | POA: Diagnosis not present

## 2013-06-18 DIAGNOSIS — N529 Male erectile dysfunction, unspecified: Secondary | ICD-10-CM | POA: Diagnosis not present

## 2013-06-19 ENCOUNTER — Inpatient Hospital Stay (HOSPITAL_COMMUNITY)
Admission: EM | Admit: 2013-06-19 | Discharge: 2013-06-23 | DRG: 310 | Disposition: A | Discharge status: Home / Self Care | Payer: Medicare Other | Attending: Internal Medicine | Admitting: Internal Medicine

## 2013-06-19 ENCOUNTER — Telehealth: Payer: Self-pay | Admitting: Cardiology

## 2013-06-19 ENCOUNTER — Encounter (HOSPITAL_COMMUNITY): Payer: Self-pay | Admitting: Emergency Medicine

## 2013-06-19 ENCOUNTER — Emergency Department (HOSPITAL_COMMUNITY): Payer: Medicare Other

## 2013-06-19 DIAGNOSIS — Z0389 Encounter for observation for other suspected diseases and conditions ruled out: Secondary | ICD-10-CM

## 2013-06-19 DIAGNOSIS — R002 Palpitations: Secondary | ICD-10-CM

## 2013-06-19 DIAGNOSIS — I059 Rheumatic mitral valve disease, unspecified: Secondary | ICD-10-CM | POA: Diagnosis present

## 2013-06-19 DIAGNOSIS — K922 Gastrointestinal hemorrhage, unspecified: Secondary | ICD-10-CM

## 2013-06-19 DIAGNOSIS — K661 Hemoperitoneum: Secondary | ICD-10-CM

## 2013-06-19 DIAGNOSIS — I498 Other specified cardiac arrhythmias: Secondary | ICD-10-CM

## 2013-06-19 DIAGNOSIS — I10 Essential (primary) hypertension: Secondary | ICD-10-CM | POA: Diagnosis not present

## 2013-06-19 DIAGNOSIS — N289 Disorder of kidney and ureter, unspecified: Secondary | ICD-10-CM

## 2013-06-19 DIAGNOSIS — E7800 Pure hypercholesterolemia, unspecified: Secondary | ICD-10-CM | POA: Diagnosis present

## 2013-06-19 DIAGNOSIS — I728 Aneurysm of other specified arteries: Secondary | ICD-10-CM

## 2013-06-19 DIAGNOSIS — IMO0001 Reserved for inherently not codable concepts without codable children: Secondary | ICD-10-CM

## 2013-06-19 DIAGNOSIS — R000 Tachycardia, unspecified: Secondary | ICD-10-CM | POA: Diagnosis not present

## 2013-06-19 DIAGNOSIS — R001 Bradycardia, unspecified: Secondary | ICD-10-CM

## 2013-06-19 DIAGNOSIS — K8020 Calculus of gallbladder without cholecystitis without obstruction: Secondary | ICD-10-CM

## 2013-06-19 DIAGNOSIS — I4891 Unspecified atrial fibrillation: Secondary | ICD-10-CM | POA: Diagnosis not present

## 2013-06-19 DIAGNOSIS — I739 Peripheral vascular disease, unspecified: Secondary | ICD-10-CM

## 2013-06-19 DIAGNOSIS — I472 Ventricular tachycardia: Secondary | ICD-10-CM

## 2013-06-19 DIAGNOSIS — M79609 Pain in unspecified limb: Secondary | ICD-10-CM

## 2013-06-19 DIAGNOSIS — D62 Acute posthemorrhagic anemia: Secondary | ICD-10-CM

## 2013-06-19 DIAGNOSIS — I4729 Other ventricular tachycardia: Secondary | ICD-10-CM

## 2013-06-19 DIAGNOSIS — R578 Other shock: Secondary | ICD-10-CM

## 2013-06-19 DIAGNOSIS — I4892 Unspecified atrial flutter: Secondary | ICD-10-CM | POA: Diagnosis not present

## 2013-06-19 DIAGNOSIS — Z91041 Radiographic dye allergy status: Secondary | ICD-10-CM

## 2013-06-19 DIAGNOSIS — D689 Coagulation defect, unspecified: Secondary | ICD-10-CM

## 2013-06-19 LAB — BASIC METABOLIC PANEL
BUN: 19 mg/dL (ref 6–23)
CALCIUM: 9.5 mg/dL (ref 8.4–10.5)
CO2: 28 mEq/L (ref 19–32)
Chloride: 101 mEq/L (ref 96–112)
Creatinine, Ser: 1.08 mg/dL (ref 0.50–1.35)
GFR calc Af Amer: 77 mL/min — ABNORMAL LOW (ref >90)
GFR, EST NON AFRICAN AMERICAN: 67 mL/min — AB (ref >90)
Glucose, Bld: 96 mg/dL (ref 70–99)
Potassium: 4.2 mEq/L (ref 3.7–5.3)
SODIUM: 140 meq/L (ref 137–147)

## 2013-06-19 LAB — CBC
HCT: 41.4 % (ref 39.0–52.0)
HEMOGLOBIN: 14.4 g/dL (ref 13.0–17.0)
MCH: 31.2 pg (ref 26.0–34.0)
MCHC: 34.8 g/dL (ref 30.0–36.0)
MCV: 89.8 fL (ref 78.0–100.0)
Platelets: 186 10*3/uL (ref 150–400)
RBC: 4.61 MIL/uL (ref 4.22–5.81)
RDW: 13.6 % (ref 11.5–15.5)
WBC: 6.6 10*3/uL (ref 4.0–10.5)

## 2013-06-19 LAB — I-STAT TROPONIN, ED: Troponin i, poc: 0 ng/mL (ref 0.00–0.08)

## 2013-06-19 LAB — PROTIME-INR
INR: 1.07 (ref 0.00–1.49)
Prothrombin Time: 13.7 seconds (ref 11.6–15.2)

## 2013-06-19 LAB — HEPARIN LEVEL (UNFRACTIONATED): HEPARIN UNFRACTIONATED: 0.42 [IU]/mL (ref 0.30–0.70)

## 2013-06-19 LAB — APTT: aPTT: 167 seconds — ABNORMAL HIGH (ref 24–37)

## 2013-06-19 LAB — PRO B NATRIURETIC PEPTIDE: Pro B Natriuretic peptide (BNP): 739.1 pg/mL — ABNORMAL HIGH (ref 0–125)

## 2013-06-19 MED ORDER — LATANOPROST 0.005 % OP SOLN
1.0000 [drp] | Freq: Every day | OPHTHALMIC | Status: DC
  Administered 2013-06-19 – 2013-06-22 (×4): 1 [drp] via OPHTHALMIC
  Filled 2013-06-19: qty 2.5

## 2013-06-19 MED ORDER — SODIUM CHLORIDE 0.9 % IV BOLUS (SEPSIS)
1000.0000 mL | Freq: Once | INTRAVENOUS | Status: AC
  Administered 2013-06-19: 1000 mL via INTRAVENOUS

## 2013-06-19 MED ORDER — DILTIAZEM HCL 25 MG/5ML IV SOLN
20.0000 mg | Freq: Once | INTRAVENOUS | Status: AC
  Administered 2013-06-19: 20 mg via INTRAVENOUS
  Filled 2013-06-19: qty 5

## 2013-06-19 MED ORDER — WARFARIN - PHARMACIST DOSING INPATIENT
Freq: Every day | Status: DC
  Administered 2013-06-19 – 2013-06-22 (×3)

## 2013-06-19 MED ORDER — HEPARIN (PORCINE) IN NACL 100-0.45 UNIT/ML-% IJ SOLN
1350.0000 [IU]/h | INTRAMUSCULAR | Status: DC
  Administered 2013-06-19 – 2013-06-21 (×3): 1350 [IU]/h via INTRAVENOUS
  Filled 2013-06-19 (×8): qty 250

## 2013-06-19 MED ORDER — DILTIAZEM HCL 25 MG/5ML IV SOLN
10.0000 mg | INTRAVENOUS | Status: AC | PRN
  Administered 2013-06-20 – 2013-06-21 (×3): 10 mg via INTRAVENOUS
  Filled 2013-06-19 (×4): qty 5

## 2013-06-19 MED ORDER — ALFUZOSIN HCL ER 10 MG PO TB24
10.0000 mg | ORAL_TABLET | Freq: Every day | ORAL | Status: DC
  Administered 2013-06-19 – 2013-06-22 (×4): 10 mg via ORAL
  Filled 2013-06-19 (×5): qty 1

## 2013-06-19 MED ORDER — CARVEDILOL 6.25 MG PO TABS
6.2500 mg | ORAL_TABLET | Freq: Two times a day (BID) | ORAL | Status: DC
  Administered 2013-06-19 – 2013-06-23 (×8): 6.25 mg via ORAL
  Filled 2013-06-19 (×10): qty 1

## 2013-06-19 MED ORDER — HEPARIN BOLUS VIA INFUSION
4000.0000 [IU] | Freq: Once | INTRAVENOUS | Status: AC
  Administered 2013-06-19: 4000 [IU] via INTRAVENOUS
  Filled 2013-06-19: qty 4000

## 2013-06-19 MED ORDER — WARFARIN SODIUM 7.5 MG PO TABS
7.5000 mg | ORAL_TABLET | Freq: Once | ORAL | Status: AC
  Administered 2013-06-19: 7.5 mg via ORAL
  Filled 2013-06-19: qty 1

## 2013-06-19 NOTE — ED Provider Notes (Signed)
CSN: [id number]     Arrival date & time 06/19/13  0730 History   First MD Initiated Contact with Patient 06/19/13 [phone]     Chief Complaint  Patient presents with  . Atrial Flutter     (Consider location/radiation/quality/duration/timing/severity/associated sxs/prior Treatment) The history is provided by the patient.  Sean Stanley is a 73 y.o. male hx of atrial flutter, HTN here with palpitations. Woke up 2:15am this morning and noticed some palpitations. He had been in rapid aflutter previously and had cardioversion. Denies chest pain or shortness of breath. Denies fever or cough or leg swelling.    Past Medical History  Diagnosis Date  . Atrial flutter   . Hypertension   . Hypercholesteremia   . Mitral valve disease     annuloplasty 2002 [facility]  . Carotid stenosis     03/10/2007 right & left  bulbs & ICAs 0-49% reduction  ,right subclavian 50% reduction   Past Surgical History  Procedure Laterality Date  . Mitral valve repair  2002    [facility]  . Tee without cardioversion  02/12/2012    Procedure: TRANSESOPHAGEAL ECHOCARDIOGRAM (TEE);  Surgeon: Pixie Casino, MD;  Location: [facility] ENDOSCOPY;  Service: Cardiovascular;  Laterality: N/A;  . Cardioversion  02/12/2012    Procedure: CARDIOVERSION;  Surgeon: Pixie Casino, MD;  Location: [facility] ENDOSCOPY;  Service: Cardiovascular;  Laterality: N/A;  . Right heart cath  06/19/2004    normal right heart dynamics. EF 50%  . Nm myoview ltd  07/22/2006    no ischemia  . Abdominal angiogram  08/05/12  . Aneurysm repair     Family History  Problem Relation Age of Onset  . Parkinson's disease Mother 95  . Heart failure Father 11  . Cancer Maternal Grandmother   . Heart attack Maternal Grandfather   . Heart attack Paternal Grandmother   . Heart attack Paternal Grandfather    History  Substance Use Topics  . Smoking status: Never Smoker   . Smokeless tobacco: Never Used  . Alcohol Use: Yes     Comment: social    Review of Systems   Cardiovascular: Positive for palpitations.  All other systems reviewed and are negative.      Allergies  Crestor; Statins; Ampicillin; and Penicillins  Home Medications   Current Outpatient Rx  Name  Route  Sig  Dispense  Refill  . alfuzosin (UROXATRAL) 10 MG 24 hr tablet   Oral   Take 10 mg by mouth daily.         . carvedilol (COREG) 6.25 MG tablet   Oral   Take 1.5 tablets (9.375 mg total) by mouth 2 (two) times daily with a meal.   270 tablet   1   . latanoprost (XALATAN) 0.005 % ophthalmic solution   Both Eyes   Place 1 drop into both eyes at bedtime.          BP 95/62  Pulse 64  Resp 15  SpO2 98% Physical Exam  Nursing note and vitals reviewed. Constitutional: He is oriented to person, place, and time.  Slightly uncomfortable   HENT:  Head: Normocephalic.  Eyes: Conjunctivae are normal. Pupils are equal, round, and reactive to light.  Neck: Normal range of motion. Neck supple.  Cardiovascular:  Tachycardic, irregular   Pulmonary/Chest: Effort normal. No respiratory distress. He has no wheezes. He has no rales.  Abdominal: Soft. Bowel sounds are normal. He exhibits no distension. There is no tenderness. There is no rebound and no guarding.  Musculoskeletal: Normal range of motion. He exhibits no edema.  Neurological: He is alert and oriented to person, place, and time.  Skin: Skin is warm and dry.  Psychiatric: He has a normal mood and affect. His behavior is normal. Judgment and thought content normal.    ED Course  Procedures (including critical care time) Labs Review Labs Reviewed  BASIC METABOLIC PANEL - Abnormal; Notable for the following:    GFR calc non Af Amer 67 (*)    GFR calc Af Amer 77 (*)    All other components within normal limits  PRO B NATRIURETIC PEPTIDE - Abnormal; Notable for the following:    Pro B Natriuretic peptide (BNP) 739.1 (*)    All other components within normal limits  CBC  I-STAT TROPOININ, ED   Imaging  Review Dg Chest Port 1 View  06/19/2013   CLINICAL DATA:  Tachycardia.  History of mitral valve disease.  EXAM: PORTABLE CHEST - 1 VIEW  COMPARISON:  DG CHEST 1V PORT dated 03/06/2013  FINDINGS: Heart size is within normal limits. The trachea is midline. There is no focal airspace disease or pulmonary edema. No acute bone abnormality.  IMPRESSION: No acute chest abnormality.   Electronically Signed   By: Markus Daft M.D.   On: 06/19/2013 08:18     EKG Interpretation   Date/Time:  Saturday June 19 2013 07:33:26 EDT Ventricular Rate:  128 PR Interval:    QRS Duration: 162 QT Interval:  290 QTC Calculation: 423 R Axis:   35 Text Interpretation:  Atrial flutter with 2:1 A-V conduction Non-specific  intra-ventricular conduction block Abnormal ECG aflutter new since  previous but had aflutter previously  Confirmed by Michael Walrath  MD, Chin Wachter ([id number])  on 06/19/2013 7:52:45 AM      MDM   Final diagnoses:  None   Sean Stanley is a 73 y.o. male here with rapid aflutter. Will give cardizem for rate control. He is off anticoagulation due to previous GI bleed. Dr. Debara Pickett consulted and will see patient.   8:57 AM HR down to 60s with one dose of cardizem. BP drops to 80s, mentating well. Will give IVF. Dr. Debara Pickett will admit.    Wandra Arthurs, MD 06/19/13 [phone]

## 2013-06-19 NOTE — H&P (Signed)
ADMISSION HISTORY & PHYSICAL   Chief Complaint:  A-fib  Cardiologist: Dr. Sallyanne Kuster  Primary Care Physician: Horton Finer, MD  HPI:  This is a 73 y.o. male with a past medical history significant for minimally invasive mitral valve annuloplasty repair roughly 12 years ago at [facility]. He has normal left ventricular systolic function and no evidence of coronary disease by angiography. He presented with symptomatic atrial flutter requiring a TEE guided cardioversion in November of 2013. 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 He was on anticoagulation with xarelto at that time. He has not had confirmed recurrent atrial flutter since then. He was referred for electrophysiology evaluation. There was concern that his atrial flutter might not be typical counterclockwise cavotricuspid isthmus flutter but an atriotomy related arrhythmia. Therefore after being seen by Dr. Caryl Comes, Dr. Rayann Heman offered a second opinion. The decision was made that he does not need ablation therapy at this time. They also recommended that he does not need chronic anticoagulation since his embolic risk is low.  Early this morning his wife called and noted that he was having palpitations when he awakened in the morning and his heart rate was up to 115 and irregular. His symptoms did not improve and he was advised to come to the emergency room.   PMHx:  Past Medical History  Diagnosis Date  . Atrial flutter   . Hypertension   . Hypercholesteremia   . Mitral valve disease     annuloplasty 2002 [facility]  . Carotid stenosis     03/10/2007 right & left  bulbs & ICAs 0-49% reduction  ,right subclavian 50% reduction    Past Surgical History  Procedure Laterality Date  . Mitral valve repair  2002    [facility]  . Tee without cardioversion  02/12/2012    Procedure: TRANSESOPHAGEAL ECHOCARDIOGRAM (TEE);  Surgeon:  Pixie Casino, MD;  Location: [facility] ENDOSCOPY;  Service: Cardiovascular;  Laterality: N/A;  . Cardioversion  02/12/2012    Procedure: CARDIOVERSION;  Surgeon: Pixie Casino, MD;  Location: [facility] ENDOSCOPY;  Service: Cardiovascular;  Laterality: N/A;  . Right heart cath  06/19/2004    normal right heart dynamics. EF 50%  . Nm myoview ltd  07/22/2006    no ischemia  . Abdominal angiogram  08/05/12  . Aneurysm repair      FAMHx:  Family History  Problem Relation Age of Onset  . Parkinson's disease Mother 82  . Heart failure Father 69  . Cancer Maternal Grandmother   . Heart attack Maternal Grandfather   . Heart attack Paternal Grandmother   . Heart attack Paternal Grandfather     SOCHx:   reports that he has never smoked. He has never used smokeless tobacco. He reports that he drinks alcohol. He reports that he does not use illicit drugs.  ALLERGIES:  Allergies  Allergen Reactions  . Crestor [Rosuvastatin] Other (See Comments)    Other reaction(s): Muscle Pain Muscle/chest pain Muscle/chest pain  . Statins Other (See Comments)    Muscle aches  . Ampicillin Rash  . Penicillins Rash    ROS: A comprehensive review of systems was negative except for: Cardiovascular: positive for irregular heart beat and palpitations  HOME MEDS:  (Not in a hospital admission)  LABS/IMAGING: Results for orders placed during the hospital encounter of 06/19/13 (from the past 48 hour(s))  CBC     Status: None   Collection  Time    06/19/13  7:45 AM      Result Value Ref Range   WBC 6.6  4.0 - 10.5 K/uL   RBC 4.61  4.22 - 5.81 MIL/uL   Hemoglobin 14.4  13.0 - 17.0 g/dL   HCT 41.4  39.0 - 52.0 %   MCV 89.8  78.0 - 100.0 fL   MCH 31.2  26.0 - 34.0 pg   MCHC 34.8  30.0 - 36.0 g/dL   RDW 13.6  11.5 - 15.5 %   Platelets 186  150 - 400 K/uL  BASIC METABOLIC PANEL     Status: Abnormal   Collection Time    06/19/13  7:45 AM      Result Value Ref Range   Sodium 140  137 - 147 mEq/L   Potassium  4.2  3.7 - 5.3 mEq/L   Chloride 101  96 - 112 mEq/L   CO2 28  19 - 32 mEq/L   Glucose, Bld 96  70 - 99 mg/dL   BUN 19  6 - 23 mg/dL   Creatinine, Ser 1.08  0.50 - 1.35 mg/dL   Calcium 9.5  8.4 - 10.5 mg/dL   GFR calc non Af Amer 67 (*) >90 mL/min   GFR calc Af Amer 77 (*) >90 mL/min   Comment: (NOTE)     The eGFR has been calculated using the CKD EPI equation.     This calculation has not been validated in all clinical situations.     eGFR's persistently <90 mL/min signify possible Chronic Kidney     Disease.  PRO B NATRIURETIC PEPTIDE     Status: Abnormal   Collection Time    06/19/13  7:45 AM      Result Value Ref Range   Pro B Natriuretic peptide (BNP) 739.1 (*) 0 - 125 pg/mL  I-STAT TROPOININ, ED     Status: None   Collection Time    06/19/13  7:55 AM      Result Value Ref Range   Troponin i, poc 0.00  0.00 - 0.08 ng/mL   Comment 3            Comment: Due to the release kinetics of cTnI,     a negative result within the first hours     of the onset of symptoms does not rule out     myocardial infarction with certainty.     If myocardial infarction is still suspected,     repeat the test at appropriate intervals.   Dg Chest Port 1 View  06/19/2013   CLINICAL DATA:  Tachycardia.  History of mitral valve disease.  EXAM: PORTABLE CHEST - 1 VIEW  COMPARISON:  DG CHEST 1V PORT dated 03/06/2013  FINDINGS: Heart size is within normal limits. The trachea is midline. There is no focal airspace disease or pulmonary edema. No acute bone abnormality.  IMPRESSION: No acute chest abnormality.   Electronically Signed   By: Markus Daft M.D.   On: 06/19/2013 08:18    VITALS: Filed Vitals:   06/19/13 0909  BP: 102/63  Pulse:   Resp: 14    EXAM: General appearance: alert and no distress Neck: no carotid bruit and no JVD Lungs: clear to auscultation bilaterally Heart: regular rate and rhythm, S1, S2 normal, no murmur, click, rub or gallop Abdomen: soft, non-tender; bowel sounds normal;  no masses,  no organomegaly Extremities: extremities normal, atraumatic, no cyanosis or edema Pulses: 2+ and symmetric Skin: Skin color, texture,  turgor normal. No rashes or lesions Neurologic: Grossly normal Psych: Mood, affect normal  IMPRESSION: Active Problems:   Atrial flutter, status post TE guided cardioversion November 2013   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/ring by Dr Evelina Dun at 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 Recurrent atrial flutter with tachycardia and periods of bradycardia. He is currently asymptomatic after rate control. It is unclear whether he's been in flutter more than 48 hours and may be unaware of flutter when he is at lower rates.  He has not been anticoagulated due to history of severe retroperitoneal bleeding in the past. This occurred on Xarelto. At this point we discussed multiple options and I would recommend TEE guided cardioversion on Monday. I would start IV heparin and oral warfarin, which is the easiest to reverse should he have bleeding problems.  He may be a candidate for antiarrhythmic therapy versus selective ablation. He has previously seen Dr. Caryl Comes and Allred about ablation. I have asked Dr. Caryl Comes to provide formal recommendations.  Pixie Casino, MD, [facility] Attending Cardiologist [facility]  HILTY,Kenneth C 06/19/2013, 9:38 AM

## 2013-06-19 NOTE — Telephone Encounter (Signed)
Wife calling on behalf of Sean Stanley. 73 yo male with PMH sig for mitral annuluplasty and parox afib/flutter. Last DCCV 02/2012. Around 3 am, awoke to urinate and discovered that he was having palpitations. Checked pulse ~115. No symptoms of chest pain, SOB, PND, syncope or presyncope.  Recommended trial of taking AM carvedilol and if still in abnormal rhythm later this AM, should present to [facility] as he may require cardioversion. If successful self converts, to follow up next week with clinic.  FYI to Dr. Recardo Evangelist

## 2013-06-19 NOTE — Progress Notes (Signed)
ANTICOAGULATION CONSULT NOTE - Follow Up Consult  Pharmacy Consult for heparin Indication: atrial fibrillation  Labs:  Recent Labs  06/19/13 0745 06/19/13 1828 06/19/13 2145  HGB 14.4  --   --   HCT 41.4  --   --   PLT 186  --   --   APTT  --  167*  --   LABPROT  --  13.7  --   INR  --  1.07  --   HEPARINUNFRC  --   --  0.42  CREATININE 1.08  --   --     Assessment/Plan:  73yo male therapeutic on heparin with initial dosing for Afib. Will continue gtt at current rate and confirm stable with am labs.   Wynona Neat, PharmD, BCPS  06/19/2013,11:31 PM

## 2013-06-19 NOTE — ED Notes (Signed)
Dr. Darl Householder made aware of BP.

## 2013-06-19 NOTE — ED Notes (Signed)
Pt reports waking up this am 0215 with palpitations. Hx of atrial flutter with cardioversion in past. Atrial flutter at triage, HR 128.

## 2013-06-19 NOTE — ED Notes (Signed)
Dr Hilty at the bedside 

## 2013-06-19 NOTE — ED Notes (Signed)
Dr. Debara Pickett back at the bedside.

## 2013-06-19 NOTE — Progress Notes (Signed)
ANTICOAGULATION CONSULT NOTE - Initial Consult  Pharmacy Consult for Heparin/coumadin Indication: atrial fibrillation  Allergies  Allergen Reactions  . Crestor [Rosuvastatin] Other (See Comments)    Other reaction(s): Muscle Pain Muscle/chest pain Muscle/chest pain  . Statins Other (See Comments)    Muscle aches  . Ampicillin Rash  . Penicillins Rash    Patient Measurements: Height: 6' 1.5" (186.7 cm) Weight: 209 lb 10.5 oz (95.1 kg) IBW/kg (Calculated) : 81.05 Heparin Dosing Weight: 95 kg  Vital Signs: Temp: 97.7 F (36.5 C) (03/28 1202) Temp src: Oral (03/28 1202) BP: 101/53 mmHg (03/28 1202) Pulse Rate: 74 (03/28 1202)  Labs:  Recent Labs  06/19/13 0745  HGB 14.4  HCT 41.4  PLT 186  CREATININE 1.08    Estimated Creatinine Clearance: 70.9 ml/min (by C-G formula based on Cr of 1.08).   Medical History: Past Medical History  Diagnosis Date  . Atrial flutter   . Hypertension   . Hypercholesteremia   . Mitral valve disease     annuloplasty 2002 [facility]  . Carotid stenosis     03/10/2007 right & left  bulbs & ICAs 0-49% reduction  ,right subclavian 50% reduction   Assessment: 73 YOM with aflutter, to start heparin/coumadin, plan for TEE guided DCCV on Monday. Noted, pt. Had hx of retroperitoneal bleeding while on xarelto, currently not on anticoagulation PTA. Hgb 14.4, plt 186, PT/INR, aPTT pending.   Goal of Therapy:  INR 2-3 Heparin level 0.3-0.7 units/ml Monitor platelets by anticoagulation protocol: Yes   Plan:  - Start IV heparin 4000 units bolus and 1350 units/hr - Coumadin 7.5mg  po x 1 - f/u heparin level at 2200, and baseline PT/INR - Daily heparin level, cbc and PT/INR   Maryanna Shape, PharmD, BCPS  Clinical Pharmacist  Pager: [phone]   06/19/2013,12:08 PM

## 2013-06-19 NOTE — ED Notes (Signed)
Repeat EKG completed and given to Dr. Darl Householder. Pt's rate slowed but still in [place].

## 2013-06-19 NOTE — ED Notes (Signed)
Dr. Yao at the bedside. 

## 2013-06-20 DIAGNOSIS — I498 Other specified cardiac arrhythmias: Secondary | ICD-10-CM | POA: Diagnosis not present

## 2013-06-20 DIAGNOSIS — I4892 Unspecified atrial flutter: Secondary | ICD-10-CM | POA: Diagnosis not present

## 2013-06-20 DIAGNOSIS — I059 Rheumatic mitral valve disease, unspecified: Secondary | ICD-10-CM | POA: Diagnosis not present

## 2013-06-20 LAB — BASIC METABOLIC PANEL
BUN: 23 mg/dL (ref 6–23)
CALCIUM: 9.2 mg/dL (ref 8.4–10.5)
CO2: 25 mEq/L (ref 19–32)
Chloride: 103 mEq/L (ref 96–112)
Creatinine, Ser: 1.07 mg/dL (ref 0.50–1.35)
GFR, EST AFRICAN AMERICAN: 78 mL/min — AB (ref >90)
GFR, EST NON AFRICAN AMERICAN: 67 mL/min — AB (ref >90)
GLUCOSE: 88 mg/dL (ref 70–99)
Potassium: 4.6 mEq/L (ref 3.7–5.3)
SODIUM: 142 meq/L (ref 137–147)

## 2013-06-20 LAB — CBC
HCT: 40.1 % (ref 39.0–52.0)
HEMOGLOBIN: 13.6 g/dL (ref 13.0–17.0)
MCH: 30.8 pg (ref 26.0–34.0)
MCHC: 33.9 g/dL (ref 30.0–36.0)
MCV: 90.7 fL (ref 78.0–100.0)
PLATELETS: 162 10*3/uL (ref 150–400)
RBC: 4.42 MIL/uL (ref 4.22–5.81)
RDW: 13.7 % (ref 11.5–15.5)
WBC: 6.9 10*3/uL (ref 4.0–10.5)

## 2013-06-20 LAB — HEPARIN LEVEL (UNFRACTIONATED): Heparin Unfractionated: 0.54 IU/mL (ref 0.30–0.70)

## 2013-06-20 LAB — PROTIME-INR
INR: 1.08 (ref 0.00–1.49)
Prothrombin Time: 13.8 seconds (ref 11.6–15.2)

## 2013-06-20 MED ORDER — SODIUM CHLORIDE 0.9 % IV SOLN
INTRAVENOUS | Status: DC
  Administered 2013-06-20 – 2013-06-22 (×2): via INTRAVENOUS

## 2013-06-20 MED ORDER — SODIUM CHLORIDE 0.9 % IV SOLN: 250.0000 mL | INTRAVENOUS | Status: DC

## 2013-06-20 MED ORDER — ZOLPIDEM TARTRATE 5 MG PO TABS: 5.0000 mg | ORAL_TABLET | Freq: Once | ORAL | Status: DC

## 2013-06-20 MED ORDER — SODIUM CHLORIDE 0.9 % IJ SOLN
3.0000 mL | Freq: Two times a day (BID) | INTRAMUSCULAR | Status: DC
  Administered 2013-06-20 – 2013-06-22 (×2): 3 mL via INTRAVENOUS

## 2013-06-20 MED ORDER — WARFARIN SODIUM 7.5 MG PO TABS
7.5000 mg | ORAL_TABLET | Freq: Once | ORAL | Status: AC
  Administered 2013-06-20: 7.5 mg via ORAL
  Filled 2013-06-20: qty 1

## 2013-06-20 MED ORDER — SODIUM CHLORIDE 0.9 % IJ SOLN: 3.0000 mL | INTRAMUSCULAR | Status: DC | PRN

## 2013-06-20 NOTE — Progress Notes (Signed)
ANTICOAGULATION CONSULT NOTE - Initial Consult  Pharmacy Consult for Heparin/coumadin Indication: atrial fibrillation  Allergies  Allergen Reactions  . Crestor [Rosuvastatin] Other (See Comments)    Other reaction(s): Muscle Pain Muscle/chest pain Muscle/chest pain  . Statins Other (See Comments)    Muscle aches  . Ampicillin Rash  . Penicillins Rash    Patient Measurements: Height: 6' 1.5" (186.7 cm) Weight: 209 lb 10.5 oz (95.1 kg) IBW/kg (Calculated) : 81.05 Heparin Dosing Weight: 95 kg  Vital Signs: Temp: 97.5 F (36.4 C) (03/29 0514) Temp src: Oral (03/29 0514) BP: 117/63 mmHg (03/29 0514) Pulse Rate: 58 (03/29 0514)  Labs:  Recent Labs  06/19/13 0745 06/19/13 1828 06/19/13 2145 06/20/13 0320  HGB 14.4  --   --  13.6  HCT 41.4  --   --  40.1  PLT 186  --   --  162  APTT  --  167*  --   --   LABPROT  --  13.7  --  13.8  INR  --  1.07  --  1.08  HEPARINUNFRC  --   --  0.42 0.54  CREATININE 1.08  --   --  1.07    Estimated Creatinine Clearance: 71.6 ml/min (by C-G formula based on Cr of 1.07).   Medical History: Past Medical History  Diagnosis Date  . Atrial flutter   . Hypercholesteremia   . Mitral valve disease     annuloplasty 2002 [facility]  . Carotid stenosis     03/10/2007 right & left  bulbs & ICAs 0-49% reduction  ,right subclavian 50% reduction   Assessment: 73 YOM with aflutter, to start heparin/coumadin, plan for TEE guided DCCV on Monday. Noted, pt. Had hx of retroperitoneal bleeding while on xarelto, currently not on anticoagulation PTA. Heparin level remains therapeutic on 1350 units/hr, INR 1.08 low as expected after one dose of coumadin 7.5 mg, CBC stable, no bleeding noted per chart.    Goal of Therapy:  INR 2-3 Heparin level 0.3-0.7 units/ml Monitor platelets by anticoagulation protocol: Yes   Plan:  - Continue heparin 1350 units/hr - Coumadin 7.5mg  po x 1 - Daily heparin level, cbc and PT/INR   Maryanna Shape, PharmD, BCPS   Clinical Pharmacist  Pager: [phone]   06/20/2013,10:36 AM

## 2013-06-20 NOTE — Progress Notes (Signed)
Patient Name: Sean Stanley Date of Encounter: 06/20/2013  Active Problems:   Atrial flutter, status post TE guided cardioversion November 2013   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/ring by Dr Evelina Dun at [facility] '02   Length of Stay: 1  SUBJECTIVE  The patient feels better, improved SOB.   CURRENT MEDS . alfuzosin  10 mg Oral Daily  . carvedilol  6.25 mg Oral BID WC  . latanoprost  1 drop Both Eyes QHS  . Warfarin - Pharmacist Dosing Inpatient   Does not apply q1800    OBJECTIVE  Filed Vitals:   06/19/13 1015 06/19/13 1202 06/19/13 2024 06/20/13 0514  BP: 96/47 101/53 104/61 117/63  Pulse: 56 74 93 58  Temp:  97.7 F (36.5 C) 98 F (36.7 C) 97.5 F (36.4 C)  TempSrc:  Oral Oral Oral  Resp: 13  18 18   Height:  6' 1.5" (1.867 m)    Weight:  209 lb 10.5 oz (95.1 kg)    SpO2: 100% 96% 93% 99%    Intake/Output Summary (Last 24 hours) at 06/20/13 1016 Last data filed at 06/19/13 1800  Gross per 24 hour  Intake 2013.3 ml  Output    400 ml  Net 1613.3 ml   Filed Weights   06/19/13 1202  Weight: 209 lb 10.5 oz (95.1 kg)   PHYSICAL EXAM  General: Pleasant, NAD. Neuro: Alert and oriented X 3. Moves all extremities spontaneously. Psych: Normal affect. HEENT:  Normal  Neck: Supple without bruits or JVD. Lungs:  Resp regular and unlabored, CTA. Heart: RRR no s3, s4, or murmurs. Abdomen: Soft, non-tender, non-distended, BS + x 4.  Extremities: No clubbing, cyanosis or edema. DP/PT/Radials 2+ and equal bilaterally.  Accessory Clinical Findings  CBC  Recent Labs  06/19/13 0745 06/20/13 0320  WBC 6.6 6.9  HGB 14.4 13.6  HCT 41.4 40.1  MCV 89.8 90.7  PLT 186 469   Basic Metabolic Panel  Recent Labs  06/19/13 0745 06/20/13 0320  NA 140 142  K 4.2 4.6  CL 101 103  CO2 28 25  GLUCOSE 96 88  BUN 19 23  CREATININE 1.08 1.07  CALCIUM 9.5 9.2   Radiology/Studies  Dg Chest Port 1 View  06/19/2013   CLINICAL DATA:  Tachycardia.  History of  mitral valve disease.  EXAM: PORTABLE CHEST - 1 VIEW  COMPARISON:  DG CHEST 1V PORT dated 03/06/2013  FINDINGS: Heart size is within normal limits. The trachea is midline. There is no focal airspace disease or pulmonary edema. No acute bone abnormality.  IMPRESSION: No acute chest abnormality.   Electronically Signed   By: Markus Daft M.D.   On: 06/19/2013 08:18    TELE: Atrial flutter with variable block, and RVR    ASSESSMENT AND PLAN  Abbreviated history: This is a 73 y.o. male with a past medical history significant for minimally invasive mitral valve annuloplasty repair roughly 12 years ago at [facility]. He has normal left ventricular systolic function and no evidence of coronary disease by angiography. He presented with symptomatic atrial flutter requiring a TEE guided cardioversion in November of 2013. There was concern that his atrial flutter might not be typical counterclockwise cavotricuspid isthmus flutter but an atriotomy related arrhythmia. Therefore after being seen by Dr. Caryl Comes, Dr. Rayann Heman offered a second opinion. The decision was made that he didn't need ablation therapy at that time. A few months ago he presented with hemorrhagic shock with a retroperitoneal hematoma secondary to a  ruptured visceral artery aneurysm. He was on anticoagulation with xarelto at that time. It was recommended that he does not need chronic anticoagulation since his embolic risk is low.  He was admitted yesterday with recurrent atrial flutter with RVR.   IMPRESSION:  1. Atrial flutter, status post TE guided cardioversion November 2013 2. 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/ring by Dr Evelina Dun at [facility] '02 3. 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 Recurrent atrial flutter with tachycardia and periods of bradycardia. He is currently asymptomatic, still episodes of RVR (with 2:1 block). At this point we discussed multiple options and I would recommend TEE  guided cardioversion on Monday. Continue IV heparin and oral warfarin, which is the easiest to reverse should he have bleeding problems. He may be a candidate for antiarrhythmic therapy versus selective ablation. He has previously seen Dr. Caryl Comes and Allred about ablation. Dr Debara Pickett asked Dr. Caryl Comes to provide formal recommendations.  Signed, [name] MD, [facility] 06/20/2013

## 2013-06-20 NOTE — Discharge Instructions (Signed)
Information on my medicine - Coumadin®   (Warfarin) ° °This medication education was reviewed with me or my healthcare representative as part of my discharge preparation.  The pharmacist that spoke with me during my hospital stay was:   ° °Why was Coumadin prescribed for you? °Coumadin was prescribed for you because you have a blood clot or a medical condition that can cause an increased risk of forming blood clots. Blood clots can cause serious health problems by blocking the flow of blood to the heart, lung, or brain. Coumadin can prevent harmful blood clots from forming. °As a reminder your indication for Coumadin is:   Stroke Prevention Because Of Atrial Fibrillation ° °What test will check on my response to Coumadin? °While on Coumadin (warfarin) you will need to have an INR test regularly to ensure that your dose is keeping you in the desired range. The INR (international normalized ratio) number is calculated from the result of the laboratory test called prothrombin time (PT). ° °If an INR APPOINTMENT HAS NOT ALREADY BEEN MADE FOR YOU please schedule an appointment to have this lab work done by your health care provider within 7 days. °Your INR goal is usually a number between:  2 to 3 or your provider may give you a more narrow range like 2-2.5.  Ask your health care provider during an office visit what your goal INR is. ° °What  do you need to  know  About  COUMADIN? °Take Coumadin (warfarin) exactly as prescribed by your healthcare provider about the same time each day.  DO NOT stop taking without talking to the doctor who prescribed the medication.  Stopping without other blood clot prevention medication to take the place of Coumadin may increase your risk of developing a new clot or stroke.  Get refills before you run out. ° °What do you do if you miss a dose? °If you miss a dose, take it as soon as you remember on the same day then continue your regularly scheduled regimen the next day.  Do not take two  doses of Coumadin at the same time. ° °Important Safety Information °A possible side effect of Coumadin (Warfarin) is an increased risk of bleeding. You should call your healthcare provider right away if you experience any of the following: °? Bleeding from an injury or your nose that does not stop. °? Unusual colored urine (red or dark brown) or unusual colored stools (red or black). °? Unusual bruising for unknown reasons. °? A serious fall or if you hit your head (even if there is no bleeding). ° °Some foods or medicines interact with Coumadin® (warfarin) and might alter your response to warfarin. To help avoid this: °? Eat a balanced diet, maintaining a consistent amount of Vitamin K. °? Notify your provider about major diet changes you plan to make. °? Avoid alcohol or limit your intake to 1 drink for women and 2 drinks for men per day. °(1 drink is 5 oz. wine, 12 oz. beer, or 1.5 oz. liquor.) ° °Make sure that ANY health care provider who prescribes medication for you knows that you are taking Coumadin (warfarin).  Also make sure the healthcare provider who is monitoring your Coumadin knows when you have started a new medication including herbals and non-prescription products. ° °Coumadin® (Warfarin)  Major Drug Interactions  °Increased Warfarin Effect Decreased Warfarin Effect  °Alcohol (large quantities) °Antibiotics (esp. Septra/Bactrim, Flagyl, Cipro) °Amiodarone (Cordarone) °Aspirin (ASA) °Cimetidine (Tagamet) °Megestrol (Megace) °NSAIDs (ibuprofen, naproxen, etc.) °Piroxicam (  Megestrol (Megace) NSAIDs (ibuprofen, naproxen, etc.) Piroxicam (Feldene) Propafenone (Rythmol SR) Propranolol (Inderal) Isoniazid (INH) Posaconazole (Noxafil) Barbiturates (Phenobarbital) Carbamazepine (Tegretol) Chlordiazepoxide (Librium) Cholestyramine (Questran) Griseofulvin Oral Contraceptives Rifampin Sucralfate (Carafate) Vitamin K   Coumadin (Warfarin) Major Herbal Interactions  Increased Warfarin Effect Decreased Warfarin Effect  Garlic Ginseng Ginkgo biloba Coenzyme  Q10 Green tea St. Johns wort    Coumadin (Warfarin) FOOD Interactions  Eat a consistent number of servings per week of foods HIGH in Vitamin K (1 serving =  cup)  Collards (cooked, or boiled & drained) Kale (cooked, or boiled & drained) Mustard greens (cooked, or boiled & drained) Parsley *serving size only =  cup Spinach (cooked, or boiled & drained) Swiss chard (cooked, or boiled & drained) Turnip greens (cooked, or boiled & drained)  Eat a consistent number of servings per week of foods MEDIUM-HIGH in Vitamin K (1 serving = 1 cup)  Asparagus (cooked, or boiled & drained) Broccoli (cooked, boiled & drained, or raw & chopped) Brussel sprouts (cooked, or boiled & drained) *serving size only =  cup Lettuce, raw (green leaf, endive, romaine) Spinach, raw Turnip greens, raw & chopped   These websites have more information on Coumadin (warfarin):  FailFactory.se; VeganReport.com.au;

## 2013-06-21 DIAGNOSIS — I059 Rheumatic mitral valve disease, unspecified: Secondary | ICD-10-CM | POA: Diagnosis not present

## 2013-06-21 DIAGNOSIS — I4892 Unspecified atrial flutter: Secondary | ICD-10-CM | POA: Diagnosis not present

## 2013-06-21 LAB — CBC
HCT: 41.1 % (ref 39.0–52.0)
Hemoglobin: 14 g/dL (ref 13.0–17.0)
MCH: 30.4 pg (ref 26.0–34.0)
MCHC: 34.1 g/dL (ref 30.0–36.0)
MCV: 89.3 fL (ref 78.0–100.0)
Platelets: 159 10*3/uL (ref 150–400)
RBC: 4.6 MIL/uL (ref 4.22–5.81)
RDW: 13.4 % (ref 11.5–15.5)
WBC: 6 10*3/uL (ref 4.0–10.5)

## 2013-06-21 LAB — PROTIME-INR
INR: 1.2 (ref 0.00–1.49)
Prothrombin Time: 14.9 seconds (ref 11.6–15.2)

## 2013-06-21 LAB — HEPARIN LEVEL (UNFRACTIONATED): Heparin Unfractionated: 0.48 IU/mL (ref 0.30–0.70)

## 2013-06-21 MED ORDER — METOPROLOL TARTRATE 1 MG/ML IV SOLN: 2.5000 mg | INTRAVENOUS | Status: DC | PRN

## 2013-06-21 MED ORDER — PATIENT'S GUIDE TO USING COUMADIN BOOK
Freq: Once | Status: AC
  Administered 2013-06-21: 18:00:00
  Filled 2013-06-21: qty 1

## 2013-06-21 MED ORDER — WARFARIN SODIUM 7.5 MG PO TABS
7.5000 mg | ORAL_TABLET | Freq: Once | ORAL | Status: AC
  Administered 2013-06-21: 7.5 mg via ORAL
  Filled 2013-06-21: qty 1

## 2013-06-21 MED ORDER — WARFARIN VIDEO
Freq: Once | Status: AC
  Administered 2013-06-21: 18:00:00

## 2013-06-21 NOTE — Consult Note (Addendum)
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 male with a h/o prior mitral valve surgery and atrial flutter who is readmitted with typical appearing atrial flutter.  Sean Stanley was seen by me 8/14 for evaluation of atrial flutter.  His only other episode of symptomatic atrial flutter was 12/13.  Sean Stanley has a h/o retroperitoneal bleed but with chads2vasc score of 1, Sean Stanley has not required subsequent anticoagulation. We discussed ablation previous and Sean Stanley declined.  Sean Stanley now returns with palpitations and exertional fatigue.  Sean Stanley is found to be in typical appearing atrial flutter.  Sean Stanley has been placed on IV heparin with plans for cardioversion and 4 week anticoagulation with coumadin.  Today, Sean Stanley denies symptoms of chest pain, orthopnea, PND, lower extremity edema, dizziness, presyncope, syncope, or neurologic sequela. The patient is tolerating medications without difficulties and is otherwise without complaint today.   Past Medical History  Diagnosis Date  . Atrial flutter   . Hypercholesteremia   . Mitral valve disease     annuloplasty 2002 [facility]  . Carotid stenosis     03/10/2007 right & left  bulbs & ICAs 0-49% reduction  ,right subclavian 50% reduction   Past Surgical History  Procedure Laterality Date  . Mitral valve repair  2002    [facility]  . Tee without cardioversion  02/12/2012    Procedure: TRANSESOPHAGEAL ECHOCARDIOGRAM (TEE);  Surgeon: Pixie Casino, MD;  Location: [facility] ENDOSCOPY;  Service: Cardiovascular;  Laterality: N/A;  . Cardioversion  02/12/2012    Procedure: CARDIOVERSION;  Surgeon: Pixie Casino, MD;  Location: [facility] ENDOSCOPY;  Service: Cardiovascular;  Laterality: N/A;  . Right heart cath  06/19/2004    normal right heart dynamics. EF 50%  . Nm myoview ltd  07/22/2006    no ischemia  . Abdominal angiogram  08/05/12  . Aneurysm repair      Current Facility-Administered Medications  Medication Dose Route Frequency Provider  Last Rate Last Dose  . 0.9 %  sodium chloride infusion   Intravenous Continuous [name], MD 20 mL/hr at 06/20/13 1732    . 0.9 %  sodium chloride infusion  250 mL Intravenous Continuous [name], MD      . alfuzosin (UROXATRAL) 24 hr tablet 10 mg  10 mg Oral Daily Pixie Casino, MD   10 mg at 06/20/13 1731  . carvedilol (COREG) tablet 6.25 mg  6.25 mg Oral BID WC Pixie Casino, MD   6.25 mg at 06/20/13 1731  . heparin ADULT infusion 100 units/mL (25000 units/250 mL)  1,350 Units/hr Intravenous Continuous Manley Mason, RPH 13.5 mL/hr at 06/20/13 2121 1,350 Units/hr at 06/20/13 2121  . latanoprost (XALATAN) 0.005 % ophthalmic solution 1 drop  1 drop Both Eyes QHS Pixie Casino, MD   1 drop at 06/20/13 2200  . sodium chloride 0.9 % injection 3 mL  3 mL Intravenous Q12H [name], MD   3 mL at 06/20/13 2200  . sodium chloride 0.9 % injection 3 mL  3 mL Intravenous PRN [name], MD      . Warfarin - Pharmacist Dosing Inpatient   Does not apply q1800 Manley Mason, RPH      . zolpidem (AMBIEN) tablet 5 mg  5 mg Oral Once Grafton Folk, MD        Allergies  Allergen Reactions  . Crestor [Rosuvastatin] Other (See Comments)    Other reaction(s): Muscle  Pain Muscle/chest pain Muscle/chest pain  . Statins Other (See Comments)    Muscle aches  . Ampicillin Rash  . Penicillins Rash    History   Social History  . Marital Status: Married    Spouse Name: N/A    Number of Children: N/A  . Years of Education: N/A   Occupational History  . Not on file.   Social History Main Topics  . Smoking status: Never Smoker   . Smokeless tobacco: Never Used  . Alcohol Use: Yes     Comment: social  . Drug Use: No  . Sexual Activity: Not on file   Other Topics Concern  . Not on file   Social History Narrative   Lives in [place], Retired    Family History  Problem Relation Age of Onset  . Parkinson's disease Mother 80  . Heart failure Father 86  .  Cancer Maternal Grandmother   . Heart attack Maternal Grandfather   . Heart attack Paternal Grandmother   . Heart attack Paternal Grandfather     ROS- All systems are reviewed and negative except as per the HPI above  Physical Exam: Filed Vitals:   06/20/13 1436 06/20/13 2142 06/21/13 0448 06/21/13 0700  BP: 116/77 104/67 103/72   Pulse: 77 71 80   Temp: 97.6 F (36.4 C) 98 F (36.7 C) 97.6 F (36.4 C)   TempSrc: Oral Oral Oral   Resp: 18 18 18    Height:      Weight:    209 lb 11.2 oz (95.119 kg)  SpO2: 99% 97% 98%     GEN- The patient is well appearing, alert and oriented x 3 today.   Head- normocephalic, atraumatic Eyes-  Sclera clear, conjunctiva pink Ears- hearing intact Oropharynx- clear Neck- supple, no JVP Lymph- no cervical lymphadenopathy Lungs- Clear to ausculation bilaterally, normal work of breathing Heart- Rirregular rate and rhythm, no murmurs, rubs or gallops, PMI not laterally displaced GI- soft, NT, ND, + BS Extremities- no clubbing, cyanosis, or edema MS- no significant deformity or atrophy Skin- no rash or lesion Psych- euthymic mood, full affect Neuro- strength and sensation are intact  EKG typical appearing atrial flutter with 2:1 AV conduction  Assessment and Plan: 1. Atrial flutter  The patient had a symptomatic recurrence of atrial flutter. EKG suggests that this could be isthmus dependant, though I cannot exclude a left atrial flutter. The patients CHADS2VASC score is only 1.  At this point, we should proceed with TEE guided cardioversion as scheduled.  Sean Stanley will require 4 week anticoagulation according to our standard practice. I would like to see him back in the office in 4-6 weeks to further discuss ablation. Sean Stanley may be more amenable to ablation at this time.  I would not recommend antiarrhythmic drug therapy in the interim.  Electrophysiology team to see as needed while here. Please call with questions.

## 2013-06-21 NOTE — Progress Notes (Signed)
Called Dr. Debara Pickett about need for PRN medication for sustained HR.  Pt bp run low and has 6.25 coreg bid.  Dr. Debara Pickett will assess and place order if needed. Pt resting with call bell within reach.  Will continue to monitor.

## 2013-06-21 NOTE — Significant Event (Deleted)
Notified Lorretta Harp PA of critical potassium level of 2.8; new orders given

## 2013-06-21 NOTE — Progress Notes (Signed)
Pharmacy Note-Anticoagulation  Pharmacy Consult :  73 y.o. male is currently on Coumadin with Heparin bridging for Atrial Flutter .   Latest Labs : Hematology :  Recent Labs  06/19/13 0745 06/19/13 1828 06/19/13 2145 06/20/13 0320 06/21/13 0415  HGB 14.4  --   --  13.6 14.0  HCT 41.4  --   --  40.1 41.1  PLT 186  --   --  162 159  APTT  --  167*  --   --   --   LABPROT  --  13.7  --  13.8 14.9  INR  --  1.07  --  1.08 1.20  HEPARINUNFRC  --   --  0.42 0.54 0.48  CREATININE 1.08  --   --  1.07  --     Current Medication[s] Include: Medication PTA: Prescriptions prior to admission  Medication Sig Dispense Refill  . alfuzosin (UROXATRAL) 10 MG 24 hr tablet Take 10 mg by mouth daily.      . carvedilol (COREG) 6.25 MG tablet Take 1.5 tablets (9.375 mg total) by mouth 2 (two) times daily with a meal.  270 tablet  1  . latanoprost (XALATAN) 0.005 % ophthalmic solution Place 1 drop into both eyes at bedtime.       Scheduled:  Scheduled:  . alfuzosin  10 mg Oral Daily  . carvedilol  6.25 mg Oral BID WC  . latanoprost  1 drop Both Eyes QHS  . sodium chloride  3 mL Intravenous Q12H  . Warfarin - Pharmacist Dosing Inpatient   Does not apply q1800  . zolpidem  5 mg Oral Once   Infusion[s]: Infusions:  . sodium chloride 20 mL/hr at 06/20/13 1732  . sodium chloride    . heparin 1,350 Units/hr (06/20/13 2121)   Antibiotic[s]: Anti-infectives   None      Assessment :  Today's INR is 1.28.   INR is trending upward.    Heparin level is within therapeutic range at 0.48 units/ml.  No bleeding complications observed.  Goal :  INR goal is 2-3    Heparin goal is Heparin level 0.3-0.7 units/ml.  Plan : 1. Heparin will be continued at 1350 units/hr.   The next Heparin Level will be due with AM labs. 2. Repeat Coumadin 7.5 mg x 1. 3. Daily Heparin level, INR, CBC, and Monitor for bleeding complications.  Karmah Potocki, Craig Guess, Pharm.D. 06/21/2013  12:28 PM

## 2013-06-22 ENCOUNTER — Inpatient Hospital Stay (HOSPITAL_COMMUNITY): Payer: Medicare Other | Admitting: Anesthesiology

## 2013-06-22 ENCOUNTER — Encounter (HOSPITAL_COMMUNITY)
Admission: EM | Disposition: A | Discharge status: Home / Self Care | Payer: Self-pay | Source: Home / Self Care | Attending: Internal Medicine

## 2013-06-22 ENCOUNTER — Encounter (HOSPITAL_COMMUNITY): Payer: Self-pay | Admitting: *Deleted

## 2013-06-22 ENCOUNTER — Encounter (HOSPITAL_COMMUNITY): Payer: Medicare Other | Admitting: Anesthesiology

## 2013-06-22 DIAGNOSIS — I4891 Unspecified atrial fibrillation: Secondary | ICD-10-CM

## 2013-06-22 HISTORY — PX: TEE WITHOUT CARDIOVERSION: SHX5443

## 2013-06-22 HISTORY — PX: CARDIOVERSION: SHX1299

## 2013-06-22 LAB — CBC
HCT: 42.4 % (ref 39.0–52.0)
Hemoglobin: 14.5 g/dL (ref 13.0–17.0)
MCH: 30.7 pg (ref 26.0–34.0)
MCHC: 34.2 g/dL (ref 30.0–36.0)
MCV: 89.6 fL (ref 78.0–100.0)
Platelets: 165 10*3/uL (ref 150–400)
RBC: 4.73 MIL/uL (ref 4.22–5.81)
RDW: 13.4 % (ref 11.5–15.5)
WBC: 6 10*3/uL (ref 4.0–10.5)

## 2013-06-22 LAB — PROTIME-INR
INR: 1.78 — ABNORMAL HIGH (ref 0.00–1.49)
Prothrombin Time: 20.2 seconds — ABNORMAL HIGH (ref 11.6–15.2)

## 2013-06-22 LAB — HEPARIN LEVEL (UNFRACTIONATED): Heparin Unfractionated: 0.48 IU/mL (ref 0.30–0.70)

## 2013-06-22 SURGERY — ECHOCARDIOGRAM, TRANSESOPHAGEAL
Anesthesia: Monitor Anesthesia Care

## 2013-06-22 MED ORDER — SODIUM CHLORIDE 0.9 % IJ SOLN: 3.0000 mL | Freq: Two times a day (BID) | INTRAMUSCULAR | Status: DC

## 2013-06-22 MED ORDER — PROPOFOL INFUSION 10 MG/ML OPTIME
INTRAVENOUS | Status: DC | PRN
  Administered 2013-06-22: 75 ug/kg/min via INTRAVENOUS

## 2013-06-22 MED ORDER — PROPOFOL 10 MG/ML IV BOLUS
INTRAVENOUS | Status: DC | PRN
  Administered 2013-06-22: 40 mg via INTRAVENOUS
  Administered 2013-06-22: 30 mg via INTRAVENOUS
  Administered 2013-06-22: 10 mg via INTRAVENOUS

## 2013-06-22 MED ORDER — WARFARIN SODIUM 5 MG PO TABS
5.0000 mg | ORAL_TABLET | Freq: Once | ORAL | Status: AC
  Administered 2013-06-22: 5 mg via ORAL
  Filled 2013-06-22: qty 1

## 2013-06-22 MED ORDER — SODIUM CHLORIDE 0.9 % IJ SOLN: 3.0000 mL | INTRAMUSCULAR | Status: DC | PRN

## 2013-06-22 MED ORDER — SODIUM CHLORIDE 0.9 % IV SOLN: 250.0000 mL | INTRAVENOUS | Status: DC

## 2013-06-22 MED ORDER — MIDAZOLAM HCL 5 MG/5ML IJ SOLN
INTRAMUSCULAR | Status: DC | PRN
  Administered 2013-06-22: 2 mg via INTRAVENOUS

## 2013-06-22 NOTE — H&P (View-Only) (Signed)
SUBJECTIVE: The patient is doing well today.  At this time, he denies chest pain, shortness of breath, or any new concerns.  Aware of palpitations with exertion.  Pending TEE/DCCV today.    INR 1.78- on Heparin bridge.   CURRENT MEDICATIONS: . alfuzosin  10 mg Oral Daily  . carvedilol  6.25 mg Oral BID WC  . latanoprost  1 drop Both Eyes QHS  . sodium chloride  3 mL Intravenous Q12H  . Warfarin - Pharmacist Dosing Inpatient   Does not apply q1800  . zolpidem  5 mg Oral Once   . sodium chloride 20 mL/hr at 06/20/13 1732  . sodium chloride    . heparin 1,350 Units/hr (06/21/13 1653)    OBJECTIVE: Physical Exam: Filed Vitals:   06/21/13 0448 06/21/13 0700 06/21/13 1419 06/22/13 0448  BP: 103/72  99/56 119/87  Pulse: 80  108 112  Temp: 97.6 F (36.4 C)  97.4 F (36.3 C) 97.7 F (36.5 C)  TempSrc: Oral  Oral Oral  Resp: 18  18 18   Height:      Weight:  209 lb 11.2 oz (95.119 kg)  210 lb 5.1 oz (95.4 kg)  SpO2: 98%  99% 99%    Intake/Output Summary (Last 24 hours) at 06/22/13 0981 Last data filed at 06/22/13 1914  Gross per 24 hour  Intake      0 ml  Output   1000 ml  Net  -1000 ml    Telemetry reveals atrial flutter - ventricular rates 100-120  GEN- The patient is well appearing, alert and oriented x 3 today.   Head- normocephalic, atraumatic Eyes-  Sclera clear, conjunctiva pink Ears- hearing intact Oropharynx- clear Neck- supple, no JVP Lymph- no cervical lymphadenopathy Lungs- Clear to ausculation bilaterally, normal work of breathing Heart- Regular rate and rhythm, no murmurs, rubs or gallops, PMI not laterally displaced GI- soft, NT, ND, + BS Extremities- no clubbing, cyanosis, or edema Neuro- strength and sensation are intact  LABS: Basic Metabolic Panel:  Recent Labs  06/19/13 0745 06/20/13 0320  NA 140 142  K 4.2 4.6  CL 101 103  CO2 28 25  GLUCOSE 96 88  BUN 19 23  CREATININE 1.08 1.07  CALCIUM 9.5 9.2   CBC:  Recent Labs   06/21/13 0415 06/22/13 0453  WBC 6.0 6.0  HGB 14.0 14.5  HCT 41.1 42.4  MCV 89.3 89.6  PLT 159 165    RADIOLOGY: Dg Chest Port 1 View 06/19/2013   CLINICAL DATA:  Tachycardia.  History of mitral valve disease.  EXAM: PORTABLE CHEST - 1 VIEW  COMPARISON:  DG CHEST 1V PORT dated 03/06/2013  FINDINGS: Heart size is within normal limits. The trachea is midline. There is no focal airspace disease or pulmonary edema. No acute bone abnormality.  IMPRESSION: No acute chest abnormality.   Electronically Signed   By: Markus Daft M.D.   On: 06/19/2013 08:18   ASSESSMENT AND PLAN:   1. Atrial flutter  The patient had a symptomatic recurrence of atrial flutter. EKG suggests that this could be isthmus dependant, though I cannot exclude a left atrial flutter. The patients CHADS2VASC score is only 1. At this point, we should proceed with TEE guided cardioversion as scheduled.  I did offer atrial flutter ablation during this admissions however he is very clear in his decision to decline presently.  He will require 4 week anticoagulation according to our standard practice.  I would like to see him back in the office  in 4-6 weeks to further evaluation. He may be more amenable to ablation at this time. I would not recommend antiarrhythmic drug therapy in the interim. He will need to continue parentaral therapy until his INR >2.  He will therefore not be discharged today.

## 2013-06-22 NOTE — Transfer of Care (Signed)
Immediate Anesthesia Transfer of Care Note  Patient: Sean Stanley  Procedure(s) Performed: Procedure(s): TRANSESOPHAGEAL ECHOCARDIOGRAM (TEE) (N/A) CARDIOVERSION (N/A)  Patient Location: PACU and Endoscopy Unit  Anesthesia Type:MAC  Level of Consciousness: lethargic and responds to stimulation  Airway & Oxygen Therapy: Patient Spontanous Breathing and Patient connected to nasal cannula oxygen  Post-op Assessment: Report given to PACU RN and Post -op Vital signs reviewed and stable  Post vital signs: Reviewed and stable  Complications: No apparent anesthesia complications

## 2013-06-22 NOTE — CV Procedure (Signed)
     Transesophageal Echocardiogram Note  Sean Stanley [id number] 12-11-1940  Procedure: Transesophageal Echocardiogram Indications: a-fib  Procedure Details Consent: Obtained Time Out: Verified patient identification, verified procedure, site/side was marked, verified correct patient position, special equipment/implants available, Radiology Safety Procedures followed,  medications/allergies/relevent history reviewed, required imaging and test results available.  Performed  Medications: IV Propofol by anesthesia staff   Left Ventrical:  There was mild concentric hypertrophy. Systolic function was mildly reduced. The estimated ejection fraction was in the range of 40-45%. Mild to moderate global hypokinesis. No evidence of thrombus in the apex.  Aortic Valve: Trace AI, No vegetation.  Mitral Valve: s/p annuloplasty ring placement, sitting well, mild MR, no paravalvular leak, no vegetation.   Tricuspid Valve: No TR, no vegetation.  Pulmonic Valve: Normal, no PR, no vegetation.  Left Atrium/ Left atrial appendage: LE dilated, no thrombus in LA/LAA. Normal filling and emptying velocities.   Atrial septum: No PFO/ASD by color Doppler.   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   Complications: No apparent complications Patient did tolerate procedure well.  Cardioversion Note  Procedure: DC Cardioversion Indications: a-fib  Procedure Details Consent: Obtained Time Out: Verified patient identification, verified procedure, site/side was marked, verified correct patient position, special equipment/implants available, Radiology Safety Procedures followed,  medications/allergies/relevent history reviewed, required imaging and test results available.  Performed  The patient has been on adequate anticoagulation.  The patient received IV Propofol by anesthesia staff for sedation.  Synchronous cardioversion was performed at 150 joules.  The cardioversion was  successful.  Complications: No apparent complications Patient did tolerate procedure well.  - No cardiac source of emboli was indentified. Successful cardioversion.  [name], MD, [facility] 06/22/2013, 10:35 AM

## 2013-06-22 NOTE — Progress Notes (Signed)
SUBJECTIVE: The patient is doing well today.  At this time, he denies chest pain, shortness of breath, or any new concerns.  Aware of palpitations with exertion.  Pending TEE/DCCV today.    INR 1.78- on Heparin bridge.   CURRENT MEDICATIONS: . alfuzosin  10 mg Oral Daily  . carvedilol  6.25 mg Oral BID WC  . latanoprost  1 drop Both Eyes QHS  . sodium chloride  3 mL Intravenous Q12H  . Warfarin - Pharmacist Dosing Inpatient   Does not apply q1800  . zolpidem  5 mg Oral Once   . sodium chloride 20 mL/hr at 06/20/13 1732  . sodium chloride    . heparin 1,350 Units/hr (06/21/13 1653)    OBJECTIVE: Physical Exam: Filed Vitals:   06/21/13 0448 06/21/13 0700 06/21/13 1419 06/22/13 0448  BP: 103/72  99/56 119/87  Pulse: 80  108 112  Temp: 97.6 F (36.4 C)  97.4 F (36.3 C) 97.7 F (36.5 C)  TempSrc: Oral  Oral Oral  Resp: 18  18 18   Height:      Weight:  209 lb 11.2 oz (95.119 kg)  210 lb 5.1 oz (95.4 kg)  SpO2: 98%  99% 99%    Intake/Output Summary (Last 24 hours) at 06/22/13 0981 Last data filed at 06/22/13 1914  Gross per 24 hour  Intake      0 ml  Output   1000 ml  Net  -1000 ml    Telemetry reveals atrial flutter - ventricular rates 100-120  GEN- The patient is well appearing, alert and oriented x 3 today.   Head- normocephalic, atraumatic Eyes-  Sclera clear, conjunctiva pink Ears- hearing intact Oropharynx- clear Neck- supple, no JVP Lymph- no cervical lymphadenopathy Lungs- Clear to ausculation bilaterally, normal work of breathing Heart- Regular rate and rhythm, no murmurs, rubs or gallops, PMI not laterally displaced GI- soft, NT, ND, + BS Extremities- no clubbing, cyanosis, or edema Neuro- strength and sensation are intact  LABS: Basic Metabolic Panel:  Recent Labs  06/19/13 0745 06/20/13 0320  NA 140 142  K 4.2 4.6  CL 101 103  CO2 28 25  GLUCOSE 96 88  BUN 19 23  CREATININE 1.08 1.07  CALCIUM 9.5 9.2   CBC:  Recent Labs   06/21/13 0415 06/22/13 0453  WBC 6.0 6.0  HGB 14.0 14.5  HCT 41.1 42.4  MCV 89.3 89.6  PLT 159 165    RADIOLOGY: Dg Chest Port 1 View 06/19/2013   CLINICAL DATA:  Tachycardia.  History of mitral valve disease.  EXAM: PORTABLE CHEST - 1 VIEW  COMPARISON:  DG CHEST 1V PORT dated 03/06/2013  FINDINGS: Heart size is within normal limits. The trachea is midline. There is no focal airspace disease or pulmonary edema. No acute bone abnormality.  IMPRESSION: No acute chest abnormality.   Electronically Signed   By: Markus Daft M.D.   On: 06/19/2013 08:18   ASSESSMENT AND PLAN:   1. Atrial flutter  The patient had a symptomatic recurrence of atrial flutter. EKG suggests that this could be isthmus dependant, though I cannot exclude a left atrial flutter. The patients CHADS2VASC score is only 1. At this point, we should proceed with TEE guided cardioversion as scheduled.  I did offer atrial flutter ablation during this admissions however he is very clear in his decision to decline presently.  He will require 4 week anticoagulation according to our standard practice.  I would like to see him back in the office  in 4-6 weeks to further evaluation. He may be more amenable to ablation at this time. I would not recommend antiarrhythmic drug therapy in the interim. He will need to continue parentaral therapy until his INR >2.  He will therefore not be discharged today.

## 2013-06-22 NOTE — Anesthesia Postprocedure Evaluation (Signed)
  Anesthesia Post-op Note  Patient: Sean Stanley  Procedure(s) Performed: Procedure(s): TRANSESOPHAGEAL ECHOCARDIOGRAM (TEE) (N/A) CARDIOVERSION (N/A)  Patient Location: PACU  Anesthesia Type: MAC  Level of Consciousness: awake and alert   Airway and Oxygen Therapy: Patient Spontanous Breathing  Post-op Pain: none  Post-op Assessment: Post-op Vital signs reviewed, Patient's Cardiovascular Status Stable and Respiratory Function Stable  Post-op Vital Signs: Reviewed  Filed Vitals:   06/22/13 1130  BP: 89/62  Pulse: 70  Temp:   Resp: 10    Complications: No apparent anesthesia complications

## 2013-06-22 NOTE — Anesthesia Preprocedure Evaluation (Addendum)
Anesthesia Evaluation  Patient identified by MRN, date of birth, ID band Patient awake    Reviewed: Allergy & Precautions, H&P , NPO status , Patient's Chart, lab work & pertinent test results, reviewed documented beta blocker date and time   Airway Mallampati: II TM Distance: >3 FB Neck ROM: Full    Dental no notable dental hx. (+) Teeth Intact, Dental Advisory Given   Pulmonary neg pulmonary ROS,  breath sounds clear to auscultation  Pulmonary exam normal       Cardiovascular hypertension, On Home Beta Blockers and On Medications + Peripheral Vascular Disease + Valvular Problems/Murmurs Rhythm:Irregular Rate:Normal     Neuro/Psych negative neurological ROS  negative psych ROS   GI/Hepatic negative GI ROS, Neg liver ROS,   Endo/Other  negative endocrine ROS  Renal/GU negative Renal ROS  negative genitourinary   Musculoskeletal   Abdominal   Peds  Hematology negative hematology ROS (+)   Anesthesia Other Findings   Reproductive/Obstetrics negative OB ROS                          Anesthesia Physical Anesthesia Plan  ASA: III  Anesthesia Plan: MAC   Post-op Pain Management:    Induction: Intravenous  Airway Management Planned: Nasal Cannula  Additional Equipment:   Intra-op Plan:   Post-operative Plan:   Informed Consent: I have reviewed the patients History and Physical, chart, labs and discussed the procedure including the risks, benefits and alternatives for the proposed anesthesia with the patient or authorized representative who has indicated his/her understanding and acceptance.   Dental advisory given  Plan Discussed with: CRNA  Anesthesia Plan Comments:         Anesthesia Quick Evaluation

## 2013-06-22 NOTE — Progress Notes (Signed)
Pharmacy Note-Anticoagulation  Pharmacy Consult :  73 y.o. male is currently on Coumadin with Heparin bridging for Atrial Flutter    Latest Labs : Hematology :  Recent Labs  06/19/13 1828 06/19/13 2145 06/20/13 0320 06/21/13 0415 06/22/13 0453  HGB  --   --  13.6 14.0 14.5  HCT  --   --  40.1 41.1 42.4  PLT  --   --  162 159 165  APTT 167*  --   --   --   --   LABPROT 13.7  --  13.8 14.9 20.2*  INR 1.07  --  1.08 1.20 1.78*  HEPARINUNFRC  --  0.42 0.54 0.48 0.48  CREATININE  --   --  1.07  --   --    Current Medication[s] Include: Medication PTA: Prescriptions prior to admission  Medication Sig Dispense Refill  . alfuzosin (UROXATRAL) 10 MG 24 hr tablet Take 10 mg by mouth daily.      . carvedilol (COREG) 6.25 MG tablet Take 1.5 tablets (9.375 mg total) by mouth 2 (two) times daily with a meal.  270 tablet  1  . latanoprost (XALATAN) 0.005 % ophthalmic solution Place 1 drop into both eyes at bedtime.       Scheduled:  Scheduled:  . alfuzosin  10 mg Oral Daily  . carvedilol  6.25 mg Oral BID WC  . latanoprost  1 drop Both Eyes QHS  . sodium chloride  3 mL Intravenous Q12H  . Warfarin - Pharmacist Dosing Inpatient   Does not apply q1800  . zolpidem  5 mg Oral Once   Infusion[s]: Infusions:  . sodium chloride 20 mL/hr at 06/20/13 1732  . sodium chloride    . heparin 1,350 Units/hr (06/21/13 1653)   Assessment :  Today's INR trending upward.   INR is 1.78.    Heparin level is within therapeutic range, 0.48.Marland Kitchen  No bleeding complications observed.  Goal :  INR goal is 2-3    Heparin goal is Heparin level 0.3-0.7 units/ml.  Plan : 1. Heparin will be continued at same rate.   The next Heparin Level will be with AM labs. 2. Coumadin 5 mg today. 3. Daily Heparin level, INR, CBC, and Monitor for bleeding complications.  Francina Beery, Craig Guess, Pharm.D. 06/22/2013  9:19 AM

## 2013-06-22 NOTE — Interval H&P Note (Signed)
History and Physical Interval Note:  06/22/2013 10:35 AM  Sean Stanley  has presented today for surgery, with the diagnosis of afib  The various methods of treatment have been discussed with the patient and family. After consideration of risks, benefits and other options for treatment, the patient has consented to  Procedure(s): TRANSESOPHAGEAL ECHOCARDIOGRAM (TEE) (N/A) CARDIOVERSION (N/A) as a surgical intervention .  The patient's history has been reviewed, patient examined, no change in status, stable for surgery.  I have reviewed the patient's chart and labs.  Questions were answered to the patient's satisfaction.     [name]

## 2013-06-22 NOTE — Preoperative (Signed)
Beta Blockers   Reason not to administer Beta Blockers:Not Applicable 

## 2013-06-23 ENCOUNTER — Encounter (HOSPITAL_COMMUNITY): Payer: Self-pay | Admitting: Cardiology

## 2013-06-23 DIAGNOSIS — I4892 Unspecified atrial flutter: Secondary | ICD-10-CM | POA: Diagnosis not present

## 2013-06-23 LAB — CBC
HCT: 40.1 % (ref 39.0–52.0)
Hemoglobin: 13.5 g/dL (ref 13.0–17.0)
MCH: 30.3 pg (ref 26.0–34.0)
MCHC: 33.7 g/dL (ref 30.0–36.0)
MCV: 90.1 fL (ref 78.0–100.0)
Platelets: 161 10*3/uL (ref 150–400)
RBC: 4.45 MIL/uL (ref 4.22–5.81)
RDW: 13.9 % (ref 11.5–15.5)
WBC: 5.5 10*3/uL (ref 4.0–10.5)

## 2013-06-23 LAB — HEPARIN LEVEL (UNFRACTIONATED): Heparin Unfractionated: 0.47 IU/mL (ref 0.30–0.70)

## 2013-06-23 LAB — PROTIME-INR
INR: 2.47 — ABNORMAL HIGH (ref 0.00–1.49)
Prothrombin Time: 25.9 seconds — ABNORMAL HIGH (ref 11.6–15.2)

## 2013-06-23 MED ORDER — CARVEDILOL 6.25 MG PO TABS: 6.2500 mg | ORAL_TABLET | Freq: Two times a day (BID) | ORAL | Status: DC

## 2013-06-23 MED ORDER — WARFARIN SODIUM 2.5 MG PO TABS: 2.5000 mg | ORAL_TABLET | Freq: Every day | ORAL | Status: DC

## 2013-06-23 MED ORDER — WARFARIN SODIUM 2.5 MG PO TABS
2.5000 mg | ORAL_TABLET | Freq: Once | ORAL | Status: AC
  Administered 2013-06-23: 2.5 mg via ORAL
  Filled 2013-06-23: qty 1

## 2013-06-23 NOTE — Progress Notes (Signed)
Patient ID: SHO SALGUERO, male   DOB: 01/09/1941, 73 y.o.   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 alfuzosin  10 mg Oral Daily  . carvedilol  6.25 mg Oral BID WC  . latanoprost  1 drop Both Eyes QHS  . sodium chloride  3 mL Intravenous Q12H  . sodium chloride  3 mL Intravenous Q12H  . Warfarin - Pharmacist Dosing Inpatient   Does not apply q1800  . zolpidem  5 mg Oral Once    OBJECTIVE  Filed Vitals:   06/22/13 1444 06/22/13 1636 06/22/13 2047 06/23/13 0518  BP: 115/74 106/64 113/72 121/68  Pulse: 74 75 69 72  Temp: 97.6 F (36.4 C)  97.6 F (36.4 C) 98.2 F (36.8 C)  TempSrc: Oral  Oral Oral  Resp: 16  18 18   Height:      Weight:    212 lb 15.4 oz (96.6 kg)  SpO2: 97%  96% 99%    Intake/Output Summary (Last 24 hours) at 06/23/13 0831 Last data filed at 06/22/13 1059  Gross per 24 hour  Intake    350 ml  Output      0 ml  Net    350 ml   Filed Weights   06/21/13 0700 06/22/13 0448 06/23/13 0518  Weight: 209 lb 11.2 oz (95.119 kg) 210 lb 5.1 oz (95.4 kg) 212 lb 15.4 oz (96.6 kg)    PHYSICAL EXAM  General: Pleasant, NAD. Neuro: Alert and oriented X 3. Moves all extremities spontaneously. Psych: Normal affect. HEENT:  Normal  Neck: Supple without bruits or JVD. Lungs:  Resp regular and unlabored, CTA. Heart: RRR no s3, s4, or murmurs. Abdomen: Soft, non-tender, non-distended, BS + x 4.  Extremities: No clubbing, cyanosis or edema. DP/PT/Radials 2+ and equal bilaterally.  Accessory Clinical Findings  CBC  Recent Labs  06/22/13 0453 06/23/13 0445  WBC 6.0 5.5  HGB 14.5 13.5  HCT 42.4 40.1  MCV 89.6 90.1  PLT 165 213   Basic Metabolic Panel No results found for this  basename: NA, K, CL, CO2, GLUCOSE, BUN, CREATININE, CALCIUM, MG, PHOS,  in the last 72 hours Liver Function Tests No results found for this basename: AST, ALT, ALKPHOS, BILITOT, PROT, ALBUMIN,  in the last 72 hours No results found for this basename: LIPASE, AMYLASE,  in the last 72 hours Cardiac Enzymes No results found for this basename: CKTOTAL, CKMB, CKMBINDEX, TROPONINI,  in the last 72 hours BNP No components found with this basename: POCBNP,  D-Dimer No results found for this basename: DDIMER,  in the last 72 hours Hemoglobin A1C No results found for this basename: HGBA1C,  in the last 72 hours Fasting Lipid Panel No results found for this basename: CHOL, HDL, LDLCALC, TRIG, CHOLHDL, LDLDIRECT,  in the last 72 hours Thyroid Function Tests No results found for this basename: TSH, T4TOTAL, FREET3, T3FREE, THYROIDAB,  in the last 72 hours  TELE  NSR  ECG NSR  Radiology/Studies  Dg Chest Port 1 View  06/19/2013   CLINICAL DATA:  Tachycardia.  History of mitral valve disease.  EXAM: PORTABLE CHEST - 1 VIEW  COMPARISON:  DG CHEST 1V PORT dated 03/06/2013  FINDINGS:  Heart size is within normal limits. The trachea is midline. There is no focal airspace disease or pulmonary edema. No acute bone abnormality.  IMPRESSION: No acute chest abnormality.   Electronically Signed   By: Markus Daft M.D.   On: 06/19/2013 08:18    ASSESSMENT AND PLAN 1. Atrial flutter with an RVR 2. S/p DCCV Rec: ok for discharge as INR is therapeutic. He will need INR check on Friday and again late next week. Followup with Dr. Rayann Heman in 4 weeks.   Gregg Taylor,M.D.  06/23/2013 8:31 AM

## 2013-06-23 NOTE — Progress Notes (Signed)
D/c instructions reviewed with pt and his wife. Copy of instructions given to pt. Coumadin education was reviewed by pharmacy this admit pt states, and hand out included in d/c instructions. Pt wanted to speak with PA Jerene Pitch that had completed d/c instructions, after Jerene Pitch spoke with pt via telephone (PA not available to come up in person was in coumadin clinic) pt was ready for discharge. Pt d/c'd via wheelchair with belongings with wife and escorted by hospital volunteer.

## 2013-06-23 NOTE — Discharge Summary (Signed)
ELECTROPHYSIOLOGY DISCHARGE SUMMARY   Patient ID: Sean Stanley,  MRN: [id number], DOB/AGE: 1940/07/01 73 y.o.  Admit date: 06/19/2013 Discharge date: 06/23/2013  Primary Care Physician: Maxwell Caul, MD Primary Cardiologist: Sallyanne Kuster, MD Primary EP: Rayann Heman, MD  Primary Discharge Diagnosis:  1. Atrial flutter  Secondary Discharge Diagnoses:  1. Mitral valve disease s/p MV repair at [facility] 2002  2. 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 TEE-guided DCCV 06/22/2013 TEE note Left Ventricle: There was mild concentric hypertrophy. Systolic function was mildly reduced. The estimated ejection fraction was in the range of 40-45%. Mild to moderate global hypokinesis. No evidence of thrombus in the apex.  Aortic Valve: Trace AI, No vegetation.  Mitral Valve: s/p annuloplasty ring placement, sitting well, mild MR, no paravalvular leak, no vegetation.  Tricuspid Valve: No TR, no vegetation.  Pulmonic Valve: Normal, no PR, no vegetation.  Left Atrium/ Left atrial appendage: LE dilated, no thrombus in LA/LAA. Normal filling and emptying velocities.  Atrial septum: No PFO/ASD by color Doppler.  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  Complications: No apparent complications  Patient did tolerate procedure well.  Cardioversion Note  The patient has been on adequate anticoagulation. The patient received IV Propofol by anesthesia staff for sedation. Synchronous cardioversion was performed at 150 joules.  The cardioversion was successful.  Complications: No apparent complications  Patient did tolerate procedure well.   History and Hospital Course:  Mr. [name] is a 73 year old man with mitral valve disease s/p MV repair 2002 and atrial flutter.   He was evaluated by Dr. Rayann Heman August 2014. He had previously had only one episode of symptomatic atrial flutter prior to that. He has had a  retroperitoneal bleed but with chads2vasc score of 1, he has not required subsequent anticoagulation. At that time, Dr. Rayann Heman and Mr. Kunkle discussed ablation; however, Mr. Brannock declined.   On 06/19/2013 he was admitted with palpitations and exertional fatigue, found to have typical appearing atrial flutter. He was placed on IV heparin with plans for cardioversion and 4 weeks of anticoagulation with Coumadin. He was evaluated again by Dr. Rayann Heman who recommended proceeding with TEE-guided DCCV with follow-up with him in the office to discuss ablation. On 06/22/2013 Mr. Kiker underwent TEE-guided DCCV. TEE was negative for LA/LAA thrombus and cardioversion was successful for restoring SR. He has been transitioned to Coumadin and his INR is therapeutic today. He remains in SR and hemodynamically stable. He has been seen, examined and deemed stable for discharge today by Dr. Cristopher Peru. He will have INR check tomorrow at our office then weekly. He will follow-up with Dr. Rayann Heman in 3-4 weeks.  Discharge Vitals: Blood pressure 121/68, pulse 72, temperature 98.2 F (36.8 C), temperature source Oral, resp. rate 18, height 6' 1.5" (1.867 m), weight 212 lb 15.4 oz (96.6 kg), SpO2 99.00%.   Labs: Lab Results  Component Value Date   WBC 5.5 06/23/2013   HGB 13.5 06/23/2013   HCT 40.1 06/23/2013   MCV 90.1 06/23/2013   PLT 161 06/23/2013     Recent Labs Lab 06/20/13 0320  NA 142  K 4.6  CL 103  CO2 25  BUN 23  CREATININE 1.07  CALCIUM 9.2  GLUCOSE 88    Recent Labs  06/24/13 1012  INR 3    Disposition:  The patient is being discharged in stable condition.  Follow-up:     Follow-up Information  Follow up with [facility] On 06/24/2013. (At 9:30 AM for Coumadin follow-up)    Contact information:   [address] [place] [address]       Follow up with Thompson Grayer, MD On 07/30/2013. (At 3:00 PM)    Specialty:  Cardiology   Contact information:   [address] [place] [address] [phone]      Discharge Medications:    Medication List         alfuzosin 10 MG 24 hr tablet  Commonly known as:  UROXATRAL  Take 10 mg by mouth daily.     carvedilol 6.25 MG tablet  Commonly known as:  COREG  Take 1 tablet (6.25 mg total) by mouth 2 (two) times daily with a meal.     latanoprost 0.005 % ophthalmic solution  Commonly known as:  XALATAN  Place 1 drop into both eyes at bedtime.     warfarin 2.5 MG tablet  Commonly known as:  COUMADIN  Take 1 tablet (2.5 mg total) by mouth daily with supper.       Duration of Discharge Encounter: Greater than 30 minutes including physician time.  Signed, Ileene Hutchinson, PA-C 06/24/2013, 2:34 PM

## 2013-06-23 NOTE — Progress Notes (Signed)
Pharmacy Note-Anticoagulation  Pharmacy Consult :  73 y.o. male is currently on Coumadin with Heparin bridging for Atrial Flutter .   Latest Labs : Hematology :  Recent Labs  06/21/13 0415 06/22/13 0453 06/23/13 0445  HGB 14.0 14.5 13.5  HCT 41.1 42.4 40.1  PLT 159 165 161  LABPROT 14.9 20.2* 25.9*  INR 1.20 1.78* 2.47*  HEPARINUNFRC 0.48 0.48 0.47   Current Medication[s] Include: Medication PTA: Prescriptions prior to admission  Medication Sig Dispense Refill  . alfuzosin (UROXATRAL) 10 MG 24 hr tablet Take 10 mg by mouth daily.      . carvedilol (COREG) 6.25 MG tablet Take 1.5 tablets (9.375 mg total) by mouth 2 (two) times daily with a meal.  270 tablet  1  . latanoprost (XALATAN) 0.005 % ophthalmic solution Place 1 drop into both eyes at bedtime.       Scheduled:  Scheduled:  . alfuzosin  10 mg Oral Daily  . carvedilol  6.25 mg Oral BID WC  . latanoprost  1 drop Both Eyes QHS  . sodium chloride  3 mL Intravenous Q12H  . sodium chloride  3 mL Intravenous Q12H  . Warfarin - Pharmacist Dosing Inpatient   Does not apply q1800  . zolpidem  5 mg Oral Once   Infusion[s]: Infusions:  . sodium chloride    . sodium chloride    . heparin 1,350 Units/hr (06/21/13 1653)    Assessment :  Today's INR, Day # 1 of therapeutic INR.  Large jump overnight with INR 1.78 > 2.47.    Heparin level 0.47 units/ml.  No bleeding complications observed.  Goal :  INR goal is 2-3  Plan : 1. Heparin will be discontinued with therapeutic INR for atrial fibrillation.    2. Reduce Coumadin to 2.5 mg po x 1 today. 3. Daily INR's, CBC. Monitor for bleeding complications.  If patient Discharged home, will need follow up INR Friday.    Deerica Waszak, Craig Guess, Pharm.D. 06/23/2013  10:00 AM

## 2013-06-24 ENCOUNTER — Ambulatory Visit (INDEPENDENT_AMBULATORY_CARE_PROVIDER_SITE_OTHER): Payer: Medicare Other | Admitting: Pharmacist Clinician (PhC)/ Clinical Pharmacy Specialist

## 2013-06-24 DIAGNOSIS — Z7901 Long term (current) use of anticoagulants: Secondary | ICD-10-CM | POA: Insufficient documentation

## 2013-06-24 DIAGNOSIS — I4892 Unspecified atrial flutter: Secondary | ICD-10-CM | POA: Diagnosis not present

## 2013-06-24 LAB — POCT INR: INR: 3

## 2013-06-24 NOTE — Progress Notes (Signed)
A full discussion of the nature of anticoagulants has been carried out.  A benefit risk analysis has been presented to the patient, so that they understand the justification for choosing anticoagulation at this time. The need for frequent and regular monitoring, precise dosage adjustment and compliance is stressed.  Side effects of potential bleeding are discussed.  The patient should avoid any OTC items containing aspirin or ibuprofen, and should avoid great swings in general diet.  .  Call if any signs of abnormal bleeding.  Next PT/INR test Wednesday April 8 at [facility] office.

## 2013-06-29 DIAGNOSIS — Z8679 Personal history of other diseases of the circulatory system: Secondary | ICD-10-CM | POA: Diagnosis not present

## 2013-06-29 DIAGNOSIS — R0989 Other specified symptoms and signs involving the circulatory and respiratory systems: Secondary | ICD-10-CM | POA: Diagnosis not present

## 2013-06-29 DIAGNOSIS — Z9889 Other specified postprocedural states: Secondary | ICD-10-CM | POA: Diagnosis not present

## 2013-06-29 DIAGNOSIS — R0609 Other forms of dyspnea: Secondary | ICD-10-CM | POA: Diagnosis not present

## 2013-06-29 DIAGNOSIS — I428 Other cardiomyopathies: Secondary | ICD-10-CM | POA: Diagnosis not present

## 2013-06-30 ENCOUNTER — Ambulatory Visit (INDEPENDENT_AMBULATORY_CARE_PROVIDER_SITE_OTHER): Payer: Medicare Other | Admitting: Pharmacist Clinician (PhC)/ Clinical Pharmacy Specialist

## 2013-06-30 DIAGNOSIS — Z7901 Long term (current) use of anticoagulants: Secondary | ICD-10-CM | POA: Diagnosis not present

## 2013-06-30 DIAGNOSIS — I4892 Unspecified atrial flutter: Secondary | ICD-10-CM

## 2013-06-30 LAB — POCT INR: INR: 1.9

## 2013-07-09 ENCOUNTER — Ambulatory Visit (INDEPENDENT_AMBULATORY_CARE_PROVIDER_SITE_OTHER): Payer: Medicare Other | Admitting: Pharmacist Clinician (PhC)/ Clinical Pharmacy Specialist

## 2013-07-09 DIAGNOSIS — Z7901 Long term (current) use of anticoagulants: Secondary | ICD-10-CM | POA: Diagnosis not present

## 2013-07-09 DIAGNOSIS — I4892 Unspecified atrial flutter: Secondary | ICD-10-CM

## 2013-07-09 LAB — POCT INR: INR: 1.6

## 2013-07-15 NOTE — Telephone Encounter (Signed)
Closed encounters °

## 2013-07-19 ENCOUNTER — Ambulatory Visit: Payer: Medicare Other | Admitting: Pharmacist Clinician (PhC)/ Clinical Pharmacy Specialist

## 2013-07-20 DIAGNOSIS — H409 Unspecified glaucoma: Secondary | ICD-10-CM | POA: Diagnosis not present

## 2013-07-20 DIAGNOSIS — H18419 Arcus senilis, unspecified eye: Secondary | ICD-10-CM | POA: Diagnosis not present

## 2013-07-20 DIAGNOSIS — H4010X0 Unspecified open-angle glaucoma, stage unspecified: Secondary | ICD-10-CM | POA: Diagnosis not present

## 2013-07-20 DIAGNOSIS — H2510 Age-related nuclear cataract, unspecified eye: Secondary | ICD-10-CM | POA: Diagnosis not present

## 2013-07-21 ENCOUNTER — Encounter: Payer: Medicare Other | Admitting: Internal Medicine

## 2013-07-21 ENCOUNTER — Ambulatory Visit (INDEPENDENT_AMBULATORY_CARE_PROVIDER_SITE_OTHER): Payer: Medicare Other | Admitting: Pharmacist Clinician (PhC)/ Clinical Pharmacy Specialist

## 2013-07-21 DIAGNOSIS — Z7901 Long term (current) use of anticoagulants: Secondary | ICD-10-CM

## 2013-07-21 DIAGNOSIS — I4892 Unspecified atrial flutter: Secondary | ICD-10-CM | POA: Diagnosis not present

## 2013-07-21 DIAGNOSIS — Z8679 Personal history of other diseases of the circulatory system: Secondary | ICD-10-CM | POA: Diagnosis not present

## 2013-07-21 DIAGNOSIS — I517 Cardiomegaly: Secondary | ICD-10-CM | POA: Diagnosis not present

## 2013-07-21 DIAGNOSIS — R9431 Abnormal electrocardiogram [ECG] [EKG]: Secondary | ICD-10-CM | POA: Diagnosis not present

## 2013-07-21 LAB — POCT INR: INR: 2.1

## 2013-07-23 ENCOUNTER — Telehealth: Payer: Self-pay | Admitting: *Deleted

## 2013-07-23 NOTE — Telephone Encounter (Signed)
Pt is going to be going to [facility] on Monday and they want his consolidated INR results. He would like to have you call him back in regards to this.

## 2013-07-23 NOTE — Telephone Encounter (Signed)
Pt also left email message today.  All INR results after hospital d/c were faxed to [place] clinic today

## 2013-07-26 DIAGNOSIS — Z7901 Long term (current) use of anticoagulants: Secondary | ICD-10-CM | POA: Diagnosis not present

## 2013-07-26 DIAGNOSIS — Z01818 Encounter for other preprocedural examination: Secondary | ICD-10-CM | POA: Diagnosis not present

## 2013-07-26 DIAGNOSIS — Z9889 Other specified postprocedural states: Secondary | ICD-10-CM | POA: Diagnosis not present

## 2013-07-26 DIAGNOSIS — I4892 Unspecified atrial flutter: Secondary | ICD-10-CM | POA: Diagnosis not present

## 2013-07-26 DIAGNOSIS — R0602 Shortness of breath: Secondary | ICD-10-CM | POA: Diagnosis not present

## 2013-07-27 DIAGNOSIS — I4892 Unspecified atrial flutter: Secondary | ICD-10-CM | POA: Diagnosis not present

## 2013-07-30 ENCOUNTER — Encounter: Payer: Medicare Other | Admitting: Internal Medicine

## 2013-08-03 NOTE — Telephone Encounter (Signed)
Close encounter 

## 2013-08-04 ENCOUNTER — Ambulatory Visit (INDEPENDENT_AMBULATORY_CARE_PROVIDER_SITE_OTHER): Payer: Medicare Other | Admitting: Pharmacist Clinician (PhC)/ Clinical Pharmacy Specialist

## 2013-08-04 DIAGNOSIS — I4892 Unspecified atrial flutter: Secondary | ICD-10-CM

## 2013-08-04 DIAGNOSIS — Z7901 Long term (current) use of anticoagulants: Secondary | ICD-10-CM

## 2013-08-04 LAB — POCT INR: INR: 1.7

## 2013-08-18 ENCOUNTER — Ambulatory Visit (INDEPENDENT_AMBULATORY_CARE_PROVIDER_SITE_OTHER): Payer: Medicare Other | Admitting: Pharmacist Clinician (PhC)/ Clinical Pharmacy Specialist

## 2013-08-18 ENCOUNTER — Encounter: Payer: Medicare Other | Admitting: Internal Medicine

## 2013-08-18 DIAGNOSIS — Z7901 Long term (current) use of anticoagulants: Secondary | ICD-10-CM

## 2013-08-18 DIAGNOSIS — I4892 Unspecified atrial flutter: Secondary | ICD-10-CM

## 2013-08-18 LAB — POCT INR: INR: 1.5

## 2013-08-30 ENCOUNTER — Ambulatory Visit (INDEPENDENT_AMBULATORY_CARE_PROVIDER_SITE_OTHER): Payer: Medicare Other | Admitting: Pharmacist Clinician (PhC)/ Clinical Pharmacy Specialist

## 2013-08-30 DIAGNOSIS — I4892 Unspecified atrial flutter: Secondary | ICD-10-CM | POA: Diagnosis not present

## 2013-08-30 DIAGNOSIS — Z7901 Long term (current) use of anticoagulants: Secondary | ICD-10-CM | POA: Diagnosis not present

## 2013-08-30 LAB — POCT INR: INR: 2.3

## 2013-09-01 DIAGNOSIS — I4892 Unspecified atrial flutter: Secondary | ICD-10-CM | POA: Diagnosis not present

## 2013-09-02 DIAGNOSIS — R918 Other nonspecific abnormal finding of lung field: Secondary | ICD-10-CM | POA: Diagnosis not present

## 2013-09-02 DIAGNOSIS — R935 Abnormal findings on diagnostic imaging of other abdominal regions, including retroperitoneum: Secondary | ICD-10-CM | POA: Diagnosis not present

## 2013-09-02 DIAGNOSIS — I722 Aneurysm of renal artery: Secondary | ICD-10-CM | POA: Diagnosis not present

## 2013-09-02 DIAGNOSIS — R58 Hemorrhage, not elsewhere classified: Secondary | ICD-10-CM | POA: Diagnosis not present

## 2013-09-02 DIAGNOSIS — K661 Hemoperitoneum: Secondary | ICD-10-CM | POA: Diagnosis not present

## 2013-09-02 DIAGNOSIS — I728 Aneurysm of other specified arteries: Secondary | ICD-10-CM | POA: Diagnosis not present

## 2013-09-07 ENCOUNTER — Other Ambulatory Visit: Payer: Self-pay | Admitting: Pharmacist Clinician (PhC)/ Clinical Pharmacy Specialist

## 2013-09-07 MED ORDER — WARFARIN SODIUM 2.5 MG PO TABS: ORAL_TABLET | ORAL | Status: DC

## 2013-09-17 ENCOUNTER — Ambulatory Visit (INDEPENDENT_AMBULATORY_CARE_PROVIDER_SITE_OTHER): Payer: Medicare Other | Admitting: Pharmacist Clinician (PhC)/ Clinical Pharmacy Specialist

## 2013-09-17 DIAGNOSIS — I4892 Unspecified atrial flutter: Secondary | ICD-10-CM | POA: Diagnosis not present

## 2013-09-17 DIAGNOSIS — Z7901 Long term (current) use of anticoagulants: Secondary | ICD-10-CM | POA: Diagnosis not present

## 2013-09-17 LAB — POCT INR: INR: 2.4

## 2013-10-08 DIAGNOSIS — I4892 Unspecified atrial flutter: Secondary | ICD-10-CM | POA: Diagnosis not present

## 2013-10-08 DIAGNOSIS — R002 Palpitations: Secondary | ICD-10-CM | POA: Diagnosis not present

## 2013-10-08 DIAGNOSIS — Z9889 Other specified postprocedural states: Secondary | ICD-10-CM | POA: Diagnosis not present

## 2013-10-08 DIAGNOSIS — I509 Heart failure, unspecified: Secondary | ICD-10-CM | POA: Diagnosis not present

## 2013-10-13 ENCOUNTER — Ambulatory Visit: Payer: Medicare Other | Admitting: Pharmacist Clinician (PhC)/ Clinical Pharmacy Specialist

## 2013-10-13 DIAGNOSIS — R002 Palpitations: Secondary | ICD-10-CM | POA: Diagnosis not present

## 2013-10-27 DIAGNOSIS — I5032 Chronic diastolic (congestive) heart failure: Secondary | ICD-10-CM | POA: Insufficient documentation

## 2013-10-28 DIAGNOSIS — I428 Other cardiomyopathies: Secondary | ICD-10-CM | POA: Diagnosis not present

## 2013-10-28 DIAGNOSIS — I4949 Other premature depolarization: Secondary | ICD-10-CM | POA: Diagnosis not present

## 2013-11-24 DIAGNOSIS — I4949 Other premature depolarization: Secondary | ICD-10-CM | POA: Diagnosis not present

## 2013-11-24 DIAGNOSIS — I428 Other cardiomyopathies: Secondary | ICD-10-CM | POA: Diagnosis not present

## 2013-12-15 DIAGNOSIS — Z0279 Encounter for issue of other medical certificate: Secondary | ICD-10-CM

## 2013-12-20 DIAGNOSIS — Z8679 Personal history of other diseases of the circulatory system: Secondary | ICD-10-CM | POA: Diagnosis not present

## 2013-12-20 DIAGNOSIS — E785 Hyperlipidemia, unspecified: Secondary | ICD-10-CM | POA: Diagnosis not present

## 2013-12-20 DIAGNOSIS — R0609 Other forms of dyspnea: Secondary | ICD-10-CM | POA: Diagnosis not present

## 2013-12-20 DIAGNOSIS — Z9889 Other specified postprocedural states: Secondary | ICD-10-CM | POA: Diagnosis not present

## 2013-12-20 DIAGNOSIS — R002 Palpitations: Secondary | ICD-10-CM | POA: Diagnosis not present

## 2013-12-20 DIAGNOSIS — Z7189 Other specified counseling: Secondary | ICD-10-CM | POA: Diagnosis not present

## 2013-12-29 DIAGNOSIS — Z23 Encounter for immunization: Secondary | ICD-10-CM | POA: Diagnosis not present

## 2014-01-26 DIAGNOSIS — I483 Typical atrial flutter: Secondary | ICD-10-CM | POA: Diagnosis not present

## 2014-01-26 DIAGNOSIS — I4892 Unspecified atrial flutter: Secondary | ICD-10-CM | POA: Diagnosis not present

## 2014-01-27 DIAGNOSIS — H269 Unspecified cataract: Secondary | ICD-10-CM | POA: Diagnosis not present

## 2014-01-27 DIAGNOSIS — H4011X0 Primary open-angle glaucoma, stage unspecified: Secondary | ICD-10-CM | POA: Diagnosis not present

## 2014-03-23 DIAGNOSIS — R400 Somnolence: Secondary | ICD-10-CM | POA: Diagnosis not present

## 2014-04-06 DIAGNOSIS — G4730 Sleep apnea, unspecified: Secondary | ICD-10-CM | POA: Diagnosis not present

## 2014-04-07 DIAGNOSIS — G4733 Obstructive sleep apnea (adult) (pediatric): Secondary | ICD-10-CM | POA: Diagnosis not present

## 2014-04-13 ENCOUNTER — Telehealth: Payer: Self-pay | Admitting: *Deleted

## 2014-04-13 NOTE — Telephone Encounter (Signed)
Dr. Debara Pickett completed "attending physician statement" r/t hospitalization that affected travel plans/plane tickets. Called patient to inform him that form was ready for pick up

## 2014-04-19 DIAGNOSIS — R002 Palpitations: Secondary | ICD-10-CM | POA: Diagnosis not present

## 2014-04-26 DIAGNOSIS — R002 Palpitations: Secondary | ICD-10-CM | POA: Diagnosis not present

## 2014-04-27 DIAGNOSIS — R002 Palpitations: Secondary | ICD-10-CM | POA: Diagnosis not present

## 2014-05-16 DIAGNOSIS — Z8679 Personal history of other diseases of the circulatory system: Secondary | ICD-10-CM | POA: Diagnosis not present

## 2014-05-16 DIAGNOSIS — R002 Palpitations: Secondary | ICD-10-CM | POA: Diagnosis not present

## 2014-05-24 DIAGNOSIS — R002 Palpitations: Secondary | ICD-10-CM | POA: Diagnosis not present

## 2014-05-24 DIAGNOSIS — Z8679 Personal history of other diseases of the circulatory system: Secondary | ICD-10-CM | POA: Diagnosis not present

## 2014-05-25 DIAGNOSIS — M5432 Sciatica, left side: Secondary | ICD-10-CM | POA: Diagnosis not present

## 2014-05-26 DIAGNOSIS — R002 Palpitations: Secondary | ICD-10-CM | POA: Diagnosis not present

## 2014-05-30 DIAGNOSIS — M79662 Pain in left lower leg: Secondary | ICD-10-CM | POA: Diagnosis not present

## 2014-05-30 DIAGNOSIS — M545 Low back pain: Secondary | ICD-10-CM | POA: Diagnosis not present

## 2014-06-01 ENCOUNTER — Other Ambulatory Visit: Payer: Self-pay | Admitting: Orthopedic Surgery

## 2014-06-01 DIAGNOSIS — Z1389 Encounter for screening for other disorder: Secondary | ICD-10-CM | POA: Diagnosis not present

## 2014-06-01 DIAGNOSIS — M545 Low back pain: Secondary | ICD-10-CM

## 2014-06-01 DIAGNOSIS — I4892 Unspecified atrial flutter: Secondary | ICD-10-CM | POA: Diagnosis not present

## 2014-06-01 DIAGNOSIS — Z0001 Encounter for general adult medical examination with abnormal findings: Secondary | ICD-10-CM | POA: Diagnosis not present

## 2014-06-01 DIAGNOSIS — M5432 Sciatica, left side: Secondary | ICD-10-CM | POA: Diagnosis not present

## 2014-06-01 DIAGNOSIS — Z79899 Other long term (current) drug therapy: Secondary | ICD-10-CM | POA: Diagnosis not present

## 2014-06-01 DIAGNOSIS — N5201 Erectile dysfunction due to arterial insufficiency: Secondary | ICD-10-CM | POA: Diagnosis not present

## 2014-06-01 DIAGNOSIS — E780 Pure hypercholesterolemia: Secondary | ICD-10-CM | POA: Diagnosis not present

## 2014-06-01 DIAGNOSIS — G4733 Obstructive sleep apnea (adult) (pediatric): Secondary | ICD-10-CM | POA: Diagnosis not present

## 2014-06-01 DIAGNOSIS — N400 Enlarged prostate without lower urinary tract symptoms: Secondary | ICD-10-CM | POA: Diagnosis not present

## 2014-06-04 ENCOUNTER — Other Ambulatory Visit: Payer: TRICARE For Life (TFL)

## 2014-06-04 ENCOUNTER — Ambulatory Visit
Admission: RE | Admit: 2014-06-04 | Discharge: 2014-06-04 | Disposition: A | Discharge status: Home / Self Care | Payer: Medicare Other | Source: Ambulatory Visit | Attending: Orthopedic Surgery | Admitting: Orthopedic Surgery

## 2014-06-04 DIAGNOSIS — M47816 Spondylosis without myelopathy or radiculopathy, lumbar region: Secondary | ICD-10-CM | POA: Diagnosis not present

## 2014-06-04 DIAGNOSIS — M5126 Other intervertebral disc displacement, lumbar region: Secondary | ICD-10-CM | POA: Diagnosis not present

## 2014-06-04 DIAGNOSIS — M545 Low back pain: Secondary | ICD-10-CM

## 2014-06-07 ENCOUNTER — Other Ambulatory Visit (HOSPITAL_COMMUNITY): Payer: Self-pay | Admitting: Orthopedic Surgery

## 2014-06-07 ENCOUNTER — Ambulatory Visit (HOSPITAL_COMMUNITY)
Admission: RE | Admit: 2014-06-07 | Discharge: 2014-06-07 | Disposition: A | Discharge status: Home / Self Care | Payer: Medicare Other | Source: Ambulatory Visit | Attending: Orthopedic Surgery | Admitting: Orthopedic Surgery

## 2014-06-07 DIAGNOSIS — M7989 Other specified soft tissue disorders: Secondary | ICD-10-CM | POA: Diagnosis not present

## 2014-06-07 DIAGNOSIS — M25562 Pain in left knee: Secondary | ICD-10-CM

## 2014-06-07 DIAGNOSIS — M79662 Pain in left lower leg: Secondary | ICD-10-CM | POA: Diagnosis not present

## 2014-06-07 DIAGNOSIS — M545 Low back pain: Secondary | ICD-10-CM | POA: Diagnosis not present

## 2014-06-07 NOTE — Progress Notes (Signed)
VASCULAR LAB PRELIMINARY  PRELIMINARY  PRELIMINARY  PRELIMINARY  Left lower extremity venous duplex completed.    Preliminary report:  Left:  No evidence of DVT, superficial thrombosis, or Baker's cyst.  Sahaana Weitman, RVS 06/07/2014, 7:01 PM

## 2014-06-08 ENCOUNTER — Ambulatory Visit (INDEPENDENT_AMBULATORY_CARE_PROVIDER_SITE_OTHER): Payer: Medicare Other | Admitting: Ophthalmology

## 2014-06-08 DIAGNOSIS — H2513 Age-related nuclear cataract, bilateral: Secondary | ICD-10-CM | POA: Diagnosis not present

## 2014-06-08 DIAGNOSIS — H33303 Unspecified retinal break, bilateral: Secondary | ICD-10-CM

## 2014-06-08 DIAGNOSIS — H43813 Vitreous degeneration, bilateral: Secondary | ICD-10-CM | POA: Diagnosis not present

## 2014-06-10 DIAGNOSIS — Z9889 Other specified postprocedural states: Secondary | ICD-10-CM | POA: Diagnosis not present

## 2014-06-10 DIAGNOSIS — Z8679 Personal history of other diseases of the circulatory system: Secondary | ICD-10-CM | POA: Diagnosis not present

## 2014-06-13 DIAGNOSIS — H0019 Chalazion unspecified eye, unspecified eyelid: Secondary | ICD-10-CM | POA: Diagnosis not present

## 2014-06-13 DIAGNOSIS — H00019 Hordeolum externum unspecified eye, unspecified eyelid: Secondary | ICD-10-CM | POA: Diagnosis not present

## 2014-06-17 DIAGNOSIS — Z8679 Personal history of other diseases of the circulatory system: Secondary | ICD-10-CM | POA: Diagnosis not present

## 2014-06-17 DIAGNOSIS — R58 Hemorrhage, not elsewhere classified: Secondary | ICD-10-CM | POA: Diagnosis not present

## 2014-06-17 DIAGNOSIS — I722 Aneurysm of renal artery: Secondary | ICD-10-CM | POA: Diagnosis not present

## 2014-06-17 DIAGNOSIS — I509 Heart failure, unspecified: Secondary | ICD-10-CM | POA: Diagnosis not present

## 2014-06-17 DIAGNOSIS — E782 Mixed hyperlipidemia: Secondary | ICD-10-CM | POA: Diagnosis not present

## 2014-06-17 DIAGNOSIS — I728 Aneurysm of other specified arteries: Secondary | ICD-10-CM | POA: Diagnosis not present

## 2014-06-17 DIAGNOSIS — J9811 Atelectasis: Secondary | ICD-10-CM | POA: Diagnosis not present

## 2014-06-27 DIAGNOSIS — M79662 Pain in left lower leg: Secondary | ICD-10-CM | POA: Diagnosis not present

## 2014-06-27 DIAGNOSIS — M545 Low back pain: Secondary | ICD-10-CM | POA: Diagnosis not present

## 2014-06-29 DIAGNOSIS — N400 Enlarged prostate without lower urinary tract symptoms: Secondary | ICD-10-CM | POA: Diagnosis not present

## 2014-06-29 DIAGNOSIS — R350 Frequency of micturition: Secondary | ICD-10-CM | POA: Diagnosis not present

## 2014-06-29 DIAGNOSIS — N5201 Erectile dysfunction due to arterial insufficiency: Secondary | ICD-10-CM | POA: Diagnosis not present

## 2014-06-30 DIAGNOSIS — M79662 Pain in left lower leg: Secondary | ICD-10-CM | POA: Diagnosis not present

## 2014-06-30 DIAGNOSIS — M545 Low back pain: Secondary | ICD-10-CM | POA: Diagnosis not present

## 2014-07-12 DIAGNOSIS — G4733 Obstructive sleep apnea (adult) (pediatric): Secondary | ICD-10-CM | POA: Diagnosis not present

## 2014-07-13 DIAGNOSIS — M79662 Pain in left lower leg: Secondary | ICD-10-CM | POA: Diagnosis not present

## 2014-07-13 DIAGNOSIS — M545 Low back pain: Secondary | ICD-10-CM | POA: Diagnosis not present

## 2014-07-15 DIAGNOSIS — M545 Low back pain: Secondary | ICD-10-CM | POA: Diagnosis not present

## 2014-07-15 DIAGNOSIS — M79662 Pain in left lower leg: Secondary | ICD-10-CM | POA: Diagnosis not present

## 2014-07-19 DIAGNOSIS — M545 Low back pain: Secondary | ICD-10-CM | POA: Diagnosis not present

## 2014-07-19 DIAGNOSIS — M79662 Pain in left lower leg: Secondary | ICD-10-CM | POA: Diagnosis not present

## 2014-07-22 DIAGNOSIS — M79662 Pain in left lower leg: Secondary | ICD-10-CM | POA: Diagnosis not present

## 2014-07-22 DIAGNOSIS — M545 Low back pain: Secondary | ICD-10-CM | POA: Diagnosis not present

## 2014-07-26 DIAGNOSIS — M545 Low back pain: Secondary | ICD-10-CM | POA: Diagnosis not present

## 2014-07-26 DIAGNOSIS — M79662 Pain in left lower leg: Secondary | ICD-10-CM | POA: Diagnosis not present

## 2014-08-01 DIAGNOSIS — H35419 Lattice degeneration of retina, unspecified eye: Secondary | ICD-10-CM | POA: Diagnosis not present

## 2014-08-01 DIAGNOSIS — H25011 Cortical age-related cataract, right eye: Secondary | ICD-10-CM | POA: Diagnosis not present

## 2014-08-01 DIAGNOSIS — H43811 Vitreous degeneration, right eye: Secondary | ICD-10-CM | POA: Diagnosis not present

## 2014-08-01 DIAGNOSIS — H4011X0 Primary open-angle glaucoma, stage unspecified: Secondary | ICD-10-CM | POA: Diagnosis not present

## 2014-08-02 DIAGNOSIS — M545 Low back pain: Secondary | ICD-10-CM | POA: Diagnosis not present

## 2014-08-02 DIAGNOSIS — M79662 Pain in left lower leg: Secondary | ICD-10-CM | POA: Diagnosis not present

## 2014-08-08 DIAGNOSIS — M79662 Pain in left lower leg: Secondary | ICD-10-CM | POA: Diagnosis not present

## 2014-08-08 DIAGNOSIS — M545 Low back pain: Secondary | ICD-10-CM | POA: Diagnosis not present

## 2014-08-25 DIAGNOSIS — G603 Idiopathic progressive neuropathy: Secondary | ICD-10-CM | POA: Diagnosis not present

## 2014-08-25 DIAGNOSIS — M5137 Other intervertebral disc degeneration, lumbosacral region: Secondary | ICD-10-CM | POA: Diagnosis not present

## 2014-08-25 DIAGNOSIS — M9905 Segmental and somatic dysfunction of pelvic region: Secondary | ICD-10-CM | POA: Diagnosis not present

## 2014-08-25 DIAGNOSIS — M9904 Segmental and somatic dysfunction of sacral region: Secondary | ICD-10-CM | POA: Diagnosis not present

## 2014-08-25 DIAGNOSIS — M9903 Segmental and somatic dysfunction of lumbar region: Secondary | ICD-10-CM | POA: Diagnosis not present

## 2014-08-25 DIAGNOSIS — M1711 Unilateral primary osteoarthritis, right knee: Secondary | ICD-10-CM | POA: Diagnosis not present

## 2014-08-30 DIAGNOSIS — M9905 Segmental and somatic dysfunction of pelvic region: Secondary | ICD-10-CM | POA: Diagnosis not present

## 2014-08-30 DIAGNOSIS — M9904 Segmental and somatic dysfunction of sacral region: Secondary | ICD-10-CM | POA: Diagnosis not present

## 2014-08-30 DIAGNOSIS — M1711 Unilateral primary osteoarthritis, right knee: Secondary | ICD-10-CM | POA: Diagnosis not present

## 2014-08-30 DIAGNOSIS — M9903 Segmental and somatic dysfunction of lumbar region: Secondary | ICD-10-CM | POA: Diagnosis not present

## 2014-08-30 DIAGNOSIS — M5137 Other intervertebral disc degeneration, lumbosacral region: Secondary | ICD-10-CM | POA: Diagnosis not present

## 2014-08-30 DIAGNOSIS — G603 Idiopathic progressive neuropathy: Secondary | ICD-10-CM | POA: Diagnosis not present

## 2014-09-01 DIAGNOSIS — M9905 Segmental and somatic dysfunction of pelvic region: Secondary | ICD-10-CM | POA: Diagnosis not present

## 2014-09-01 DIAGNOSIS — M1711 Unilateral primary osteoarthritis, right knee: Secondary | ICD-10-CM | POA: Diagnosis not present

## 2014-09-01 DIAGNOSIS — M9903 Segmental and somatic dysfunction of lumbar region: Secondary | ICD-10-CM | POA: Diagnosis not present

## 2014-09-01 DIAGNOSIS — G603 Idiopathic progressive neuropathy: Secondary | ICD-10-CM | POA: Diagnosis not present

## 2014-09-01 DIAGNOSIS — M5137 Other intervertebral disc degeneration, lumbosacral region: Secondary | ICD-10-CM | POA: Diagnosis not present

## 2014-09-01 DIAGNOSIS — M9904 Segmental and somatic dysfunction of sacral region: Secondary | ICD-10-CM | POA: Diagnosis not present

## 2014-09-05 DIAGNOSIS — G603 Idiopathic progressive neuropathy: Secondary | ICD-10-CM | POA: Diagnosis not present

## 2014-09-05 DIAGNOSIS — M9905 Segmental and somatic dysfunction of pelvic region: Secondary | ICD-10-CM | POA: Diagnosis not present

## 2014-09-05 DIAGNOSIS — M5137 Other intervertebral disc degeneration, lumbosacral region: Secondary | ICD-10-CM | POA: Diagnosis not present

## 2014-09-05 DIAGNOSIS — M9904 Segmental and somatic dysfunction of sacral region: Secondary | ICD-10-CM | POA: Diagnosis not present

## 2014-09-05 DIAGNOSIS — M9903 Segmental and somatic dysfunction of lumbar region: Secondary | ICD-10-CM | POA: Diagnosis not present

## 2014-09-05 DIAGNOSIS — M1711 Unilateral primary osteoarthritis, right knee: Secondary | ICD-10-CM | POA: Diagnosis not present

## 2014-09-06 DIAGNOSIS — M9903 Segmental and somatic dysfunction of lumbar region: Secondary | ICD-10-CM | POA: Diagnosis not present

## 2014-09-06 DIAGNOSIS — M9904 Segmental and somatic dysfunction of sacral region: Secondary | ICD-10-CM | POA: Diagnosis not present

## 2014-09-06 DIAGNOSIS — M9905 Segmental and somatic dysfunction of pelvic region: Secondary | ICD-10-CM | POA: Diagnosis not present

## 2014-09-06 DIAGNOSIS — G603 Idiopathic progressive neuropathy: Secondary | ICD-10-CM | POA: Diagnosis not present

## 2014-09-06 DIAGNOSIS — M1711 Unilateral primary osteoarthritis, right knee: Secondary | ICD-10-CM | POA: Diagnosis not present

## 2014-09-06 DIAGNOSIS — M5137 Other intervertebral disc degeneration, lumbosacral region: Secondary | ICD-10-CM | POA: Diagnosis not present

## 2014-09-08 DIAGNOSIS — M9904 Segmental and somatic dysfunction of sacral region: Secondary | ICD-10-CM | POA: Diagnosis not present

## 2014-09-08 DIAGNOSIS — M9905 Segmental and somatic dysfunction of pelvic region: Secondary | ICD-10-CM | POA: Diagnosis not present

## 2014-09-08 DIAGNOSIS — G603 Idiopathic progressive neuropathy: Secondary | ICD-10-CM | POA: Diagnosis not present

## 2014-09-08 DIAGNOSIS — M1711 Unilateral primary osteoarthritis, right knee: Secondary | ICD-10-CM | POA: Diagnosis not present

## 2014-09-08 DIAGNOSIS — M9903 Segmental and somatic dysfunction of lumbar region: Secondary | ICD-10-CM | POA: Diagnosis not present

## 2014-09-08 DIAGNOSIS — M5137 Other intervertebral disc degeneration, lumbosacral region: Secondary | ICD-10-CM | POA: Diagnosis not present

## 2014-09-12 DIAGNOSIS — M5137 Other intervertebral disc degeneration, lumbosacral region: Secondary | ICD-10-CM | POA: Diagnosis not present

## 2014-09-12 DIAGNOSIS — G603 Idiopathic progressive neuropathy: Secondary | ICD-10-CM | POA: Diagnosis not present

## 2014-09-12 DIAGNOSIS — M9904 Segmental and somatic dysfunction of sacral region: Secondary | ICD-10-CM | POA: Diagnosis not present

## 2014-09-12 DIAGNOSIS — M1711 Unilateral primary osteoarthritis, right knee: Secondary | ICD-10-CM | POA: Diagnosis not present

## 2014-09-12 DIAGNOSIS — M9903 Segmental and somatic dysfunction of lumbar region: Secondary | ICD-10-CM | POA: Diagnosis not present

## 2014-09-12 DIAGNOSIS — M9905 Segmental and somatic dysfunction of pelvic region: Secondary | ICD-10-CM | POA: Diagnosis not present

## 2014-09-13 DIAGNOSIS — M5137 Other intervertebral disc degeneration, lumbosacral region: Secondary | ICD-10-CM | POA: Diagnosis not present

## 2014-09-13 DIAGNOSIS — M9905 Segmental and somatic dysfunction of pelvic region: Secondary | ICD-10-CM | POA: Diagnosis not present

## 2014-09-13 DIAGNOSIS — G603 Idiopathic progressive neuropathy: Secondary | ICD-10-CM | POA: Diagnosis not present

## 2014-09-13 DIAGNOSIS — M1711 Unilateral primary osteoarthritis, right knee: Secondary | ICD-10-CM | POA: Diagnosis not present

## 2014-09-13 DIAGNOSIS — M9904 Segmental and somatic dysfunction of sacral region: Secondary | ICD-10-CM | POA: Diagnosis not present

## 2014-09-13 DIAGNOSIS — M9903 Segmental and somatic dysfunction of lumbar region: Secondary | ICD-10-CM | POA: Diagnosis not present

## 2014-09-15 DIAGNOSIS — M9904 Segmental and somatic dysfunction of sacral region: Secondary | ICD-10-CM | POA: Diagnosis not present

## 2014-09-15 DIAGNOSIS — M1711 Unilateral primary osteoarthritis, right knee: Secondary | ICD-10-CM | POA: Diagnosis not present

## 2014-09-15 DIAGNOSIS — M9905 Segmental and somatic dysfunction of pelvic region: Secondary | ICD-10-CM | POA: Diagnosis not present

## 2014-09-15 DIAGNOSIS — M5137 Other intervertebral disc degeneration, lumbosacral region: Secondary | ICD-10-CM | POA: Diagnosis not present

## 2014-09-15 DIAGNOSIS — G603 Idiopathic progressive neuropathy: Secondary | ICD-10-CM | POA: Diagnosis not present

## 2014-09-15 DIAGNOSIS — M9903 Segmental and somatic dysfunction of lumbar region: Secondary | ICD-10-CM | POA: Diagnosis not present

## 2014-09-19 ENCOUNTER — Other Ambulatory Visit: Payer: Self-pay

## 2014-09-27 DIAGNOSIS — M1711 Unilateral primary osteoarthritis, right knee: Secondary | ICD-10-CM | POA: Diagnosis not present

## 2014-09-27 DIAGNOSIS — M9903 Segmental and somatic dysfunction of lumbar region: Secondary | ICD-10-CM | POA: Diagnosis not present

## 2014-09-27 DIAGNOSIS — M9905 Segmental and somatic dysfunction of pelvic region: Secondary | ICD-10-CM | POA: Diagnosis not present

## 2014-09-27 DIAGNOSIS — M5137 Other intervertebral disc degeneration, lumbosacral region: Secondary | ICD-10-CM | POA: Diagnosis not present

## 2014-09-27 DIAGNOSIS — G603 Idiopathic progressive neuropathy: Secondary | ICD-10-CM | POA: Diagnosis not present

## 2014-09-27 DIAGNOSIS — M9904 Segmental and somatic dysfunction of sacral region: Secondary | ICD-10-CM | POA: Diagnosis not present

## 2014-09-28 DIAGNOSIS — M9905 Segmental and somatic dysfunction of pelvic region: Secondary | ICD-10-CM | POA: Diagnosis not present

## 2014-09-28 DIAGNOSIS — G603 Idiopathic progressive neuropathy: Secondary | ICD-10-CM | POA: Diagnosis not present

## 2014-09-28 DIAGNOSIS — M1711 Unilateral primary osteoarthritis, right knee: Secondary | ICD-10-CM | POA: Diagnosis not present

## 2014-09-28 DIAGNOSIS — M5137 Other intervertebral disc degeneration, lumbosacral region: Secondary | ICD-10-CM | POA: Diagnosis not present

## 2014-09-28 DIAGNOSIS — M9904 Segmental and somatic dysfunction of sacral region: Secondary | ICD-10-CM | POA: Diagnosis not present

## 2014-09-28 DIAGNOSIS — M9903 Segmental and somatic dysfunction of lumbar region: Secondary | ICD-10-CM | POA: Diagnosis not present

## 2014-09-29 DIAGNOSIS — M9905 Segmental and somatic dysfunction of pelvic region: Secondary | ICD-10-CM | POA: Diagnosis not present

## 2014-09-29 DIAGNOSIS — M9903 Segmental and somatic dysfunction of lumbar region: Secondary | ICD-10-CM | POA: Diagnosis not present

## 2014-09-29 DIAGNOSIS — M9904 Segmental and somatic dysfunction of sacral region: Secondary | ICD-10-CM | POA: Diagnosis not present

## 2014-09-29 DIAGNOSIS — G603 Idiopathic progressive neuropathy: Secondary | ICD-10-CM | POA: Diagnosis not present

## 2014-09-29 DIAGNOSIS — M5137 Other intervertebral disc degeneration, lumbosacral region: Secondary | ICD-10-CM | POA: Diagnosis not present

## 2014-09-29 DIAGNOSIS — M1711 Unilateral primary osteoarthritis, right knee: Secondary | ICD-10-CM | POA: Diagnosis not present

## 2014-10-03 DIAGNOSIS — G603 Idiopathic progressive neuropathy: Secondary | ICD-10-CM | POA: Diagnosis not present

## 2014-10-03 DIAGNOSIS — M9905 Segmental and somatic dysfunction of pelvic region: Secondary | ICD-10-CM | POA: Diagnosis not present

## 2014-10-03 DIAGNOSIS — M9904 Segmental and somatic dysfunction of sacral region: Secondary | ICD-10-CM | POA: Diagnosis not present

## 2014-10-03 DIAGNOSIS — M5137 Other intervertebral disc degeneration, lumbosacral region: Secondary | ICD-10-CM | POA: Diagnosis not present

## 2014-10-03 DIAGNOSIS — M1711 Unilateral primary osteoarthritis, right knee: Secondary | ICD-10-CM | POA: Diagnosis not present

## 2014-10-03 DIAGNOSIS — M9903 Segmental and somatic dysfunction of lumbar region: Secondary | ICD-10-CM | POA: Diagnosis not present

## 2014-10-04 DIAGNOSIS — M5137 Other intervertebral disc degeneration, lumbosacral region: Secondary | ICD-10-CM | POA: Diagnosis not present

## 2014-10-04 DIAGNOSIS — G603 Idiopathic progressive neuropathy: Secondary | ICD-10-CM | POA: Diagnosis not present

## 2014-10-04 DIAGNOSIS — M9904 Segmental and somatic dysfunction of sacral region: Secondary | ICD-10-CM | POA: Diagnosis not present

## 2014-10-04 DIAGNOSIS — M9905 Segmental and somatic dysfunction of pelvic region: Secondary | ICD-10-CM | POA: Diagnosis not present

## 2014-10-04 DIAGNOSIS — M1711 Unilateral primary osteoarthritis, right knee: Secondary | ICD-10-CM | POA: Diagnosis not present

## 2014-10-04 DIAGNOSIS — M9903 Segmental and somatic dysfunction of lumbar region: Secondary | ICD-10-CM | POA: Diagnosis not present

## 2014-10-07 DIAGNOSIS — M9904 Segmental and somatic dysfunction of sacral region: Secondary | ICD-10-CM | POA: Diagnosis not present

## 2014-10-07 DIAGNOSIS — G603 Idiopathic progressive neuropathy: Secondary | ICD-10-CM | POA: Diagnosis not present

## 2014-10-07 DIAGNOSIS — M9905 Segmental and somatic dysfunction of pelvic region: Secondary | ICD-10-CM | POA: Diagnosis not present

## 2014-10-07 DIAGNOSIS — M5137 Other intervertebral disc degeneration, lumbosacral region: Secondary | ICD-10-CM | POA: Diagnosis not present

## 2014-10-07 DIAGNOSIS — M1711 Unilateral primary osteoarthritis, right knee: Secondary | ICD-10-CM | POA: Diagnosis not present

## 2014-10-07 DIAGNOSIS — M9903 Segmental and somatic dysfunction of lumbar region: Secondary | ICD-10-CM | POA: Diagnosis not present

## 2014-10-10 DIAGNOSIS — M5137 Other intervertebral disc degeneration, lumbosacral region: Secondary | ICD-10-CM | POA: Diagnosis not present

## 2014-10-10 DIAGNOSIS — M9903 Segmental and somatic dysfunction of lumbar region: Secondary | ICD-10-CM | POA: Diagnosis not present

## 2014-10-10 DIAGNOSIS — M9905 Segmental and somatic dysfunction of pelvic region: Secondary | ICD-10-CM | POA: Diagnosis not present

## 2014-10-10 DIAGNOSIS — M1711 Unilateral primary osteoarthritis, right knee: Secondary | ICD-10-CM | POA: Diagnosis not present

## 2014-10-10 DIAGNOSIS — M9904 Segmental and somatic dysfunction of sacral region: Secondary | ICD-10-CM | POA: Diagnosis not present

## 2014-10-10 DIAGNOSIS — G603 Idiopathic progressive neuropathy: Secondary | ICD-10-CM | POA: Diagnosis not present

## 2014-10-11 DIAGNOSIS — M1711 Unilateral primary osteoarthritis, right knee: Secondary | ICD-10-CM | POA: Diagnosis not present

## 2014-10-11 DIAGNOSIS — M9904 Segmental and somatic dysfunction of sacral region: Secondary | ICD-10-CM | POA: Diagnosis not present

## 2014-10-11 DIAGNOSIS — M5137 Other intervertebral disc degeneration, lumbosacral region: Secondary | ICD-10-CM | POA: Diagnosis not present

## 2014-10-11 DIAGNOSIS — M9905 Segmental and somatic dysfunction of pelvic region: Secondary | ICD-10-CM | POA: Diagnosis not present

## 2014-10-11 DIAGNOSIS — M9903 Segmental and somatic dysfunction of lumbar region: Secondary | ICD-10-CM | POA: Diagnosis not present

## 2014-10-11 DIAGNOSIS — G603 Idiopathic progressive neuropathy: Secondary | ICD-10-CM | POA: Diagnosis not present

## 2014-10-13 DIAGNOSIS — M9903 Segmental and somatic dysfunction of lumbar region: Secondary | ICD-10-CM | POA: Diagnosis not present

## 2014-10-13 DIAGNOSIS — M9904 Segmental and somatic dysfunction of sacral region: Secondary | ICD-10-CM | POA: Diagnosis not present

## 2014-10-13 DIAGNOSIS — M9905 Segmental and somatic dysfunction of pelvic region: Secondary | ICD-10-CM | POA: Diagnosis not present

## 2014-10-13 DIAGNOSIS — G603 Idiopathic progressive neuropathy: Secondary | ICD-10-CM | POA: Diagnosis not present

## 2014-10-13 DIAGNOSIS — M5137 Other intervertebral disc degeneration, lumbosacral region: Secondary | ICD-10-CM | POA: Diagnosis not present

## 2014-10-13 DIAGNOSIS — M1711 Unilateral primary osteoarthritis, right knee: Secondary | ICD-10-CM | POA: Diagnosis not present

## 2014-10-17 DIAGNOSIS — M5137 Other intervertebral disc degeneration, lumbosacral region: Secondary | ICD-10-CM | POA: Diagnosis not present

## 2014-10-17 DIAGNOSIS — M9905 Segmental and somatic dysfunction of pelvic region: Secondary | ICD-10-CM | POA: Diagnosis not present

## 2014-10-17 DIAGNOSIS — M9904 Segmental and somatic dysfunction of sacral region: Secondary | ICD-10-CM | POA: Diagnosis not present

## 2014-10-17 DIAGNOSIS — G603 Idiopathic progressive neuropathy: Secondary | ICD-10-CM | POA: Diagnosis not present

## 2014-10-17 DIAGNOSIS — M9903 Segmental and somatic dysfunction of lumbar region: Secondary | ICD-10-CM | POA: Diagnosis not present

## 2014-10-17 DIAGNOSIS — M1711 Unilateral primary osteoarthritis, right knee: Secondary | ICD-10-CM | POA: Diagnosis not present

## 2014-10-20 DIAGNOSIS — M1711 Unilateral primary osteoarthritis, right knee: Secondary | ICD-10-CM | POA: Diagnosis not present

## 2014-10-20 DIAGNOSIS — M9905 Segmental and somatic dysfunction of pelvic region: Secondary | ICD-10-CM | POA: Diagnosis not present

## 2014-10-20 DIAGNOSIS — M5137 Other intervertebral disc degeneration, lumbosacral region: Secondary | ICD-10-CM | POA: Diagnosis not present

## 2014-10-20 DIAGNOSIS — M9904 Segmental and somatic dysfunction of sacral region: Secondary | ICD-10-CM | POA: Diagnosis not present

## 2014-10-20 DIAGNOSIS — G603 Idiopathic progressive neuropathy: Secondary | ICD-10-CM | POA: Diagnosis not present

## 2014-10-20 DIAGNOSIS — M9903 Segmental and somatic dysfunction of lumbar region: Secondary | ICD-10-CM | POA: Diagnosis not present

## 2014-10-24 DIAGNOSIS — Z9889 Other specified postprocedural states: Secondary | ICD-10-CM | POA: Diagnosis not present

## 2014-10-24 DIAGNOSIS — I5032 Chronic diastolic (congestive) heart failure: Secondary | ICD-10-CM | POA: Diagnosis not present

## 2014-10-24 DIAGNOSIS — Z8679 Personal history of other diseases of the circulatory system: Secondary | ICD-10-CM | POA: Diagnosis not present

## 2014-10-24 DIAGNOSIS — E782 Mixed hyperlipidemia: Secondary | ICD-10-CM | POA: Diagnosis not present

## 2014-10-25 DIAGNOSIS — G603 Idiopathic progressive neuropathy: Secondary | ICD-10-CM | POA: Diagnosis not present

## 2014-10-25 DIAGNOSIS — M9904 Segmental and somatic dysfunction of sacral region: Secondary | ICD-10-CM | POA: Diagnosis not present

## 2014-10-25 DIAGNOSIS — M9903 Segmental and somatic dysfunction of lumbar region: Secondary | ICD-10-CM | POA: Diagnosis not present

## 2014-10-25 DIAGNOSIS — M9905 Segmental and somatic dysfunction of pelvic region: Secondary | ICD-10-CM | POA: Diagnosis not present

## 2014-10-25 DIAGNOSIS — M5137 Other intervertebral disc degeneration, lumbosacral region: Secondary | ICD-10-CM | POA: Diagnosis not present

## 2014-10-25 DIAGNOSIS — M1711 Unilateral primary osteoarthritis, right knee: Secondary | ICD-10-CM | POA: Diagnosis not present

## 2014-10-26 DIAGNOSIS — N401 Enlarged prostate with lower urinary tract symptoms: Secondary | ICD-10-CM | POA: Diagnosis not present

## 2014-10-26 DIAGNOSIS — N5201 Erectile dysfunction due to arterial insufficiency: Secondary | ICD-10-CM | POA: Diagnosis not present

## 2014-10-28 DIAGNOSIS — M9903 Segmental and somatic dysfunction of lumbar region: Secondary | ICD-10-CM | POA: Diagnosis not present

## 2014-10-28 DIAGNOSIS — M1711 Unilateral primary osteoarthritis, right knee: Secondary | ICD-10-CM | POA: Diagnosis not present

## 2014-10-28 DIAGNOSIS — M9904 Segmental and somatic dysfunction of sacral region: Secondary | ICD-10-CM | POA: Diagnosis not present

## 2014-10-28 DIAGNOSIS — M9905 Segmental and somatic dysfunction of pelvic region: Secondary | ICD-10-CM | POA: Diagnosis not present

## 2014-10-28 DIAGNOSIS — G603 Idiopathic progressive neuropathy: Secondary | ICD-10-CM | POA: Diagnosis not present

## 2014-10-28 DIAGNOSIS — M5137 Other intervertebral disc degeneration, lumbosacral region: Secondary | ICD-10-CM | POA: Diagnosis not present

## 2014-11-01 DIAGNOSIS — M5137 Other intervertebral disc degeneration, lumbosacral region: Secondary | ICD-10-CM | POA: Diagnosis not present

## 2014-11-01 DIAGNOSIS — M9903 Segmental and somatic dysfunction of lumbar region: Secondary | ICD-10-CM | POA: Diagnosis not present

## 2014-11-01 DIAGNOSIS — M9904 Segmental and somatic dysfunction of sacral region: Secondary | ICD-10-CM | POA: Diagnosis not present

## 2014-11-01 DIAGNOSIS — G603 Idiopathic progressive neuropathy: Secondary | ICD-10-CM | POA: Diagnosis not present

## 2014-11-01 DIAGNOSIS — M1711 Unilateral primary osteoarthritis, right knee: Secondary | ICD-10-CM | POA: Diagnosis not present

## 2014-11-01 DIAGNOSIS — M9905 Segmental and somatic dysfunction of pelvic region: Secondary | ICD-10-CM | POA: Diagnosis not present

## 2014-11-08 DIAGNOSIS — M9905 Segmental and somatic dysfunction of pelvic region: Secondary | ICD-10-CM | POA: Diagnosis not present

## 2014-11-08 DIAGNOSIS — G603 Idiopathic progressive neuropathy: Secondary | ICD-10-CM | POA: Diagnosis not present

## 2014-11-08 DIAGNOSIS — M9903 Segmental and somatic dysfunction of lumbar region: Secondary | ICD-10-CM | POA: Diagnosis not present

## 2014-11-08 DIAGNOSIS — M9904 Segmental and somatic dysfunction of sacral region: Secondary | ICD-10-CM | POA: Diagnosis not present

## 2014-11-08 DIAGNOSIS — M1711 Unilateral primary osteoarthritis, right knee: Secondary | ICD-10-CM | POA: Diagnosis not present

## 2014-11-08 DIAGNOSIS — M5137 Other intervertebral disc degeneration, lumbosacral region: Secondary | ICD-10-CM | POA: Diagnosis not present

## 2014-11-18 DIAGNOSIS — N401 Enlarged prostate with lower urinary tract symptoms: Secondary | ICD-10-CM | POA: Diagnosis not present

## 2014-11-18 DIAGNOSIS — R399 Unspecified symptoms and signs involving the genitourinary system: Secondary | ICD-10-CM | POA: Diagnosis not present

## 2014-11-30 DIAGNOSIS — M9903 Segmental and somatic dysfunction of lumbar region: Secondary | ICD-10-CM | POA: Diagnosis not present

## 2014-11-30 DIAGNOSIS — M5137 Other intervertebral disc degeneration, lumbosacral region: Secondary | ICD-10-CM | POA: Diagnosis not present

## 2014-11-30 DIAGNOSIS — M9904 Segmental and somatic dysfunction of sacral region: Secondary | ICD-10-CM | POA: Diagnosis not present

## 2014-11-30 DIAGNOSIS — M9905 Segmental and somatic dysfunction of pelvic region: Secondary | ICD-10-CM | POA: Diagnosis not present

## 2014-11-30 DIAGNOSIS — G603 Idiopathic progressive neuropathy: Secondary | ICD-10-CM | POA: Diagnosis not present

## 2014-11-30 DIAGNOSIS — M1711 Unilateral primary osteoarthritis, right knee: Secondary | ICD-10-CM | POA: Diagnosis not present

## 2015-01-12 DIAGNOSIS — Z23 Encounter for immunization: Secondary | ICD-10-CM | POA: Diagnosis not present

## 2015-02-10 DIAGNOSIS — H401113 Primary open-angle glaucoma, right eye, severe stage: Secondary | ICD-10-CM | POA: Diagnosis not present

## 2015-02-10 DIAGNOSIS — H2512 Age-related nuclear cataract, left eye: Secondary | ICD-10-CM | POA: Diagnosis not present

## 2015-02-10 DIAGNOSIS — H02839 Dermatochalasis of unspecified eye, unspecified eyelid: Secondary | ICD-10-CM | POA: Diagnosis not present

## 2015-02-10 DIAGNOSIS — H25011 Cortical age-related cataract, right eye: Secondary | ICD-10-CM | POA: Diagnosis not present

## 2015-03-10 DIAGNOSIS — M25561 Pain in right knee: Secondary | ICD-10-CM | POA: Diagnosis not present

## 2015-03-10 DIAGNOSIS — G622 Polyneuropathy due to other toxic agents: Secondary | ICD-10-CM | POA: Diagnosis not present

## 2015-03-24 DIAGNOSIS — M1711 Unilateral primary osteoarthritis, right knee: Secondary | ICD-10-CM | POA: Diagnosis not present

## 2015-04-11 DIAGNOSIS — M1711 Unilateral primary osteoarthritis, right knee: Secondary | ICD-10-CM | POA: Diagnosis not present

## 2015-04-18 DIAGNOSIS — M1711 Unilateral primary osteoarthritis, right knee: Secondary | ICD-10-CM | POA: Diagnosis not present

## 2015-05-08 DIAGNOSIS — Z9889 Other specified postprocedural states: Secondary | ICD-10-CM | POA: Diagnosis not present

## 2015-05-08 DIAGNOSIS — I10 Essential (primary) hypertension: Secondary | ICD-10-CM | POA: Diagnosis not present

## 2015-05-08 DIAGNOSIS — R002 Palpitations: Secondary | ICD-10-CM | POA: Diagnosis not present

## 2015-05-08 DIAGNOSIS — I5032 Chronic diastolic (congestive) heart failure: Secondary | ICD-10-CM | POA: Diagnosis not present

## 2015-05-08 DIAGNOSIS — Z8679 Personal history of other diseases of the circulatory system: Secondary | ICD-10-CM | POA: Diagnosis not present

## 2015-05-18 DIAGNOSIS — N10 Acute tubulo-interstitial nephritis: Secondary | ICD-10-CM | POA: Diagnosis not present

## 2015-05-22 ENCOUNTER — Ambulatory Visit
Admission: RE | Admit: 2015-05-22 | Discharge: 2015-05-22 | Disposition: A | Discharge status: Home / Self Care | Payer: Medicare Other | Source: Ambulatory Visit | Attending: Internal Medicine | Admitting: Internal Medicine

## 2015-05-22 ENCOUNTER — Other Ambulatory Visit: Payer: Self-pay | Admitting: Internal Medicine

## 2015-05-22 DIAGNOSIS — R05 Cough: Secondary | ICD-10-CM | POA: Diagnosis not present

## 2015-05-22 DIAGNOSIS — J209 Acute bronchitis, unspecified: Secondary | ICD-10-CM | POA: Diagnosis not present

## 2015-05-22 DIAGNOSIS — R059 Cough, unspecified: Secondary | ICD-10-CM

## 2015-05-22 DIAGNOSIS — N400 Enlarged prostate without lower urinary tract symptoms: Secondary | ICD-10-CM | POA: Diagnosis not present

## 2015-05-22 DIAGNOSIS — N390 Urinary tract infection, site not specified: Secondary | ICD-10-CM | POA: Diagnosis not present

## 2015-05-23 DIAGNOSIS — R3915 Urgency of urination: Secondary | ICD-10-CM | POA: Diagnosis not present

## 2015-05-23 DIAGNOSIS — Z87440 Personal history of urinary (tract) infections: Secondary | ICD-10-CM | POA: Diagnosis not present

## 2015-05-23 DIAGNOSIS — Z0000 Encounter for general adult medical examination without abnormal findings: Secondary | ICD-10-CM | POA: Diagnosis not present

## 2015-05-23 DIAGNOSIS — R350 Frequency of micturition: Secondary | ICD-10-CM | POA: Diagnosis not present

## 2015-05-25 DIAGNOSIS — Z8701 Personal history of pneumonia (recurrent): Secondary | ICD-10-CM | POA: Diagnosis not present

## 2015-05-25 DIAGNOSIS — T887XXD Unspecified adverse effect of drug or medicament, subsequent encounter: Secondary | ICD-10-CM | POA: Diagnosis not present

## 2015-05-29 DIAGNOSIS — J189 Pneumonia, unspecified organism: Secondary | ICD-10-CM | POA: Diagnosis not present

## 2015-06-09 DIAGNOSIS — I4892 Unspecified atrial flutter: Secondary | ICD-10-CM | POA: Diagnosis not present

## 2015-06-09 DIAGNOSIS — Z1389 Encounter for screening for other disorder: Secondary | ICD-10-CM | POA: Diagnosis not present

## 2015-06-09 DIAGNOSIS — N5201 Erectile dysfunction due to arterial insufficiency: Secondary | ICD-10-CM | POA: Diagnosis not present

## 2015-06-09 DIAGNOSIS — Z79899 Other long term (current) drug therapy: Secondary | ICD-10-CM | POA: Diagnosis not present

## 2015-06-09 DIAGNOSIS — Z0001 Encounter for general adult medical examination with abnormal findings: Secondary | ICD-10-CM | POA: Diagnosis not present

## 2015-06-09 DIAGNOSIS — Z125 Encounter for screening for malignant neoplasm of prostate: Secondary | ICD-10-CM | POA: Diagnosis not present

## 2015-06-09 DIAGNOSIS — E7800 Pure hypercholesterolemia, unspecified: Secondary | ICD-10-CM | POA: Diagnosis not present

## 2015-06-09 DIAGNOSIS — G622 Polyneuropathy due to other toxic agents: Secondary | ICD-10-CM | POA: Diagnosis not present

## 2015-06-09 DIAGNOSIS — J189 Pneumonia, unspecified organism: Secondary | ICD-10-CM | POA: Diagnosis not present

## 2015-06-09 DIAGNOSIS — N400 Enlarged prostate without lower urinary tract symptoms: Secondary | ICD-10-CM | POA: Diagnosis not present

## 2015-06-09 DIAGNOSIS — G4733 Obstructive sleep apnea (adult) (pediatric): Secondary | ICD-10-CM | POA: Diagnosis not present

## 2015-06-13 ENCOUNTER — Other Ambulatory Visit: Payer: Self-pay | Admitting: Internal Medicine

## 2015-06-13 ENCOUNTER — Ambulatory Visit (INDEPENDENT_AMBULATORY_CARE_PROVIDER_SITE_OTHER): Payer: Medicare Other | Admitting: Ophthalmology

## 2015-06-13 ENCOUNTER — Ambulatory Visit
Admission: RE | Admit: 2015-06-13 | Discharge: 2015-06-13 | Disposition: A | Discharge status: Home / Self Care | Payer: Medicare Other | Source: Ambulatory Visit | Attending: Internal Medicine | Admitting: Internal Medicine

## 2015-06-13 DIAGNOSIS — H33303 Unspecified retinal break, bilateral: Secondary | ICD-10-CM

## 2015-06-13 DIAGNOSIS — J189 Pneumonia, unspecified organism: Secondary | ICD-10-CM

## 2015-06-13 DIAGNOSIS — H2513 Age-related nuclear cataract, bilateral: Secondary | ICD-10-CM | POA: Diagnosis not present

## 2015-06-13 DIAGNOSIS — H43813 Vitreous degeneration, bilateral: Secondary | ICD-10-CM | POA: Diagnosis not present

## 2015-06-16 DIAGNOSIS — I774 Celiac artery compression syndrome: Secondary | ICD-10-CM | POA: Diagnosis not present

## 2015-06-16 DIAGNOSIS — I728 Aneurysm of other specified arteries: Secondary | ICD-10-CM | POA: Diagnosis not present

## 2015-06-19 DIAGNOSIS — R350 Frequency of micturition: Secondary | ICD-10-CM | POA: Diagnosis not present

## 2015-06-19 DIAGNOSIS — Z87440 Personal history of urinary (tract) infections: Secondary | ICD-10-CM | POA: Diagnosis not present

## 2015-06-19 DIAGNOSIS — R3915 Urgency of urination: Secondary | ICD-10-CM | POA: Diagnosis not present

## 2015-06-19 DIAGNOSIS — Z0000 Encounter for general adult medical examination without abnormal findings: Secondary | ICD-10-CM | POA: Diagnosis not present

## 2015-06-19 DIAGNOSIS — N401 Enlarged prostate with lower urinary tract symptoms: Secondary | ICD-10-CM | POA: Diagnosis not present

## 2015-07-13 DIAGNOSIS — G4733 Obstructive sleep apnea (adult) (pediatric): Secondary | ICD-10-CM | POA: Diagnosis not present

## 2015-07-18 DIAGNOSIS — M791 Myalgia: Secondary | ICD-10-CM | POA: Diagnosis not present

## 2015-07-18 DIAGNOSIS — Z8679 Personal history of other diseases of the circulatory system: Secondary | ICD-10-CM | POA: Diagnosis not present

## 2015-07-18 DIAGNOSIS — I493 Ventricular premature depolarization: Secondary | ICD-10-CM | POA: Diagnosis not present

## 2015-07-18 DIAGNOSIS — R0789 Other chest pain: Secondary | ICD-10-CM | POA: Diagnosis not present

## 2015-07-18 DIAGNOSIS — I728 Aneurysm of other specified arteries: Secondary | ICD-10-CM | POA: Diagnosis not present

## 2015-07-18 DIAGNOSIS — R0609 Other forms of dyspnea: Secondary | ICD-10-CM | POA: Diagnosis not present

## 2015-07-18 DIAGNOSIS — R002 Palpitations: Secondary | ICD-10-CM | POA: Diagnosis not present

## 2015-07-18 DIAGNOSIS — E782 Mixed hyperlipidemia: Secondary | ICD-10-CM | POA: Diagnosis not present

## 2015-07-18 DIAGNOSIS — I5022 Chronic systolic (congestive) heart failure: Secondary | ICD-10-CM | POA: Diagnosis not present

## 2015-07-28 DIAGNOSIS — Z87440 Personal history of urinary (tract) infections: Secondary | ICD-10-CM | POA: Diagnosis not present

## 2015-07-28 DIAGNOSIS — R350 Frequency of micturition: Secondary | ICD-10-CM | POA: Diagnosis not present

## 2015-07-28 DIAGNOSIS — I728 Aneurysm of other specified arteries: Secondary | ICD-10-CM | POA: Diagnosis not present

## 2015-07-28 DIAGNOSIS — N401 Enlarged prostate with lower urinary tract symptoms: Secondary | ICD-10-CM | POA: Diagnosis not present

## 2015-07-28 DIAGNOSIS — R0609 Other forms of dyspnea: Secondary | ICD-10-CM | POA: Diagnosis not present

## 2015-07-28 DIAGNOSIS — R972 Elevated prostate specific antigen [PSA]: Secondary | ICD-10-CM | POA: Diagnosis not present

## 2015-07-28 DIAGNOSIS — I493 Ventricular premature depolarization: Secondary | ICD-10-CM | POA: Diagnosis not present

## 2015-07-28 DIAGNOSIS — R002 Palpitations: Secondary | ICD-10-CM | POA: Diagnosis not present

## 2015-07-28 DIAGNOSIS — Z0000 Encounter for general adult medical examination without abnormal findings: Secondary | ICD-10-CM | POA: Diagnosis not present

## 2015-08-15 DIAGNOSIS — T466X1A Poisoning by antihyperlipidemic and antiarteriosclerotic drugs, accidental (unintentional), initial encounter: Secondary | ICD-10-CM | POA: Diagnosis not present

## 2015-08-15 DIAGNOSIS — Z8679 Personal history of other diseases of the circulatory system: Secondary | ICD-10-CM | POA: Diagnosis not present

## 2015-08-15 DIAGNOSIS — I493 Ventricular premature depolarization: Secondary | ICD-10-CM | POA: Diagnosis not present

## 2015-08-15 DIAGNOSIS — E782 Mixed hyperlipidemia: Secondary | ICD-10-CM | POA: Diagnosis not present

## 2015-08-15 DIAGNOSIS — R002 Palpitations: Secondary | ICD-10-CM | POA: Diagnosis not present

## 2015-08-15 DIAGNOSIS — I5022 Chronic systolic (congestive) heart failure: Secondary | ICD-10-CM | POA: Diagnosis not present

## 2015-08-15 DIAGNOSIS — R0789 Other chest pain: Secondary | ICD-10-CM | POA: Diagnosis not present

## 2015-08-15 DIAGNOSIS — G720 Drug-induced myopathy: Secondary | ICD-10-CM | POA: Diagnosis not present

## 2015-08-28 DIAGNOSIS — H35411 Lattice degeneration of retina, right eye: Secondary | ICD-10-CM | POA: Diagnosis not present

## 2015-08-28 DIAGNOSIS — H43811 Vitreous degeneration, right eye: Secondary | ICD-10-CM | POA: Diagnosis not present

## 2015-08-28 DIAGNOSIS — H25011 Cortical age-related cataract, right eye: Secondary | ICD-10-CM | POA: Diagnosis not present

## 2015-08-28 DIAGNOSIS — H401130 Primary open-angle glaucoma, bilateral, stage unspecified: Secondary | ICD-10-CM | POA: Diagnosis not present

## 2015-10-06 DIAGNOSIS — R972 Elevated prostate specific antigen [PSA]: Secondary | ICD-10-CM | POA: Diagnosis not present

## 2015-11-20 DIAGNOSIS — M545 Low back pain: Secondary | ICD-10-CM | POA: Diagnosis not present

## 2015-12-07 DIAGNOSIS — I5032 Chronic diastolic (congestive) heart failure: Secondary | ICD-10-CM | POA: Diagnosis not present

## 2015-12-07 DIAGNOSIS — I493 Ventricular premature depolarization: Secondary | ICD-10-CM | POA: Diagnosis not present

## 2015-12-07 DIAGNOSIS — R5382 Chronic fatigue, unspecified: Secondary | ICD-10-CM | POA: Diagnosis not present

## 2015-12-12 DIAGNOSIS — N50819 Testicular pain, unspecified: Secondary | ICD-10-CM | POA: Diagnosis not present

## 2015-12-12 DIAGNOSIS — Z23 Encounter for immunization: Secondary | ICD-10-CM | POA: Diagnosis not present

## 2015-12-13 DIAGNOSIS — R1032 Left lower quadrant pain: Secondary | ICD-10-CM | POA: Diagnosis not present

## 2015-12-18 DIAGNOSIS — I493 Ventricular premature depolarization: Secondary | ICD-10-CM | POA: Diagnosis not present

## 2015-12-20 DIAGNOSIS — I493 Ventricular premature depolarization: Secondary | ICD-10-CM | POA: Diagnosis not present

## 2016-01-24 DIAGNOSIS — N401 Enlarged prostate with lower urinary tract symptoms: Secondary | ICD-10-CM | POA: Diagnosis not present

## 2016-01-24 DIAGNOSIS — R3915 Urgency of urination: Secondary | ICD-10-CM | POA: Diagnosis not present

## 2016-01-24 DIAGNOSIS — R350 Frequency of micturition: Secondary | ICD-10-CM | POA: Diagnosis not present

## 2016-01-24 DIAGNOSIS — R1030 Lower abdominal pain, unspecified: Secondary | ICD-10-CM | POA: Diagnosis not present

## 2016-02-12 DIAGNOSIS — N50819 Testicular pain, unspecified: Secondary | ICD-10-CM | POA: Diagnosis not present

## 2016-02-12 DIAGNOSIS — R1030 Lower abdominal pain, unspecified: Secondary | ICD-10-CM | POA: Diagnosis not present

## 2016-02-14 DIAGNOSIS — N5201 Erectile dysfunction due to arterial insufficiency: Secondary | ICD-10-CM | POA: Diagnosis not present

## 2016-02-14 DIAGNOSIS — R972 Elevated prostate specific antigen [PSA]: Secondary | ICD-10-CM | POA: Diagnosis not present

## 2016-02-14 DIAGNOSIS — R1030 Lower abdominal pain, unspecified: Secondary | ICD-10-CM | POA: Diagnosis not present

## 2016-02-14 DIAGNOSIS — R350 Frequency of micturition: Secondary | ICD-10-CM | POA: Diagnosis not present

## 2016-02-14 DIAGNOSIS — N401 Enlarged prostate with lower urinary tract symptoms: Secondary | ICD-10-CM | POA: Diagnosis not present

## 2016-02-29 DIAGNOSIS — H401131 Primary open-angle glaucoma, bilateral, mild stage: Secondary | ICD-10-CM | POA: Diagnosis not present

## 2016-02-29 DIAGNOSIS — H2513 Age-related nuclear cataract, bilateral: Secondary | ICD-10-CM | POA: Diagnosis not present

## 2016-03-22 DIAGNOSIS — S76212A Strain of adductor muscle, fascia and tendon of left thigh, initial encounter: Secondary | ICD-10-CM | POA: Diagnosis not present

## 2016-03-25 HISTORY — PX: CATARACT EXTRACTION: SUR2

## 2016-06-11 DIAGNOSIS — Z8679 Personal history of other diseases of the circulatory system: Secondary | ICD-10-CM | POA: Diagnosis not present

## 2016-06-11 DIAGNOSIS — E782 Mixed hyperlipidemia: Secondary | ICD-10-CM | POA: Diagnosis not present

## 2016-06-11 DIAGNOSIS — I5032 Chronic diastolic (congestive) heart failure: Secondary | ICD-10-CM | POA: Diagnosis not present

## 2016-06-11 DIAGNOSIS — Z9889 Other specified postprocedural states: Secondary | ICD-10-CM | POA: Diagnosis not present

## 2016-06-14 DIAGNOSIS — I774 Celiac artery compression syndrome: Secondary | ICD-10-CM | POA: Diagnosis not present

## 2016-06-14 DIAGNOSIS — I728 Aneurysm of other specified arteries: Secondary | ICD-10-CM | POA: Diagnosis not present

## 2016-06-18 ENCOUNTER — Ambulatory Visit (INDEPENDENT_AMBULATORY_CARE_PROVIDER_SITE_OTHER): Payer: Medicare Other | Admitting: Ophthalmology

## 2016-07-05 DIAGNOSIS — Z1389 Encounter for screening for other disorder: Secondary | ICD-10-CM | POA: Diagnosis not present

## 2016-07-05 DIAGNOSIS — I4892 Unspecified atrial flutter: Secondary | ICD-10-CM | POA: Diagnosis not present

## 2016-07-05 DIAGNOSIS — Z79899 Other long term (current) drug therapy: Secondary | ICD-10-CM | POA: Diagnosis not present

## 2016-07-05 DIAGNOSIS — H9193 Unspecified hearing loss, bilateral: Secondary | ICD-10-CM | POA: Diagnosis not present

## 2016-07-05 DIAGNOSIS — Z0000 Encounter for general adult medical examination without abnormal findings: Secondary | ICD-10-CM | POA: Diagnosis not present

## 2016-07-05 DIAGNOSIS — G622 Polyneuropathy due to other toxic agents: Secondary | ICD-10-CM | POA: Diagnosis not present

## 2016-07-05 DIAGNOSIS — N5201 Erectile dysfunction due to arterial insufficiency: Secondary | ICD-10-CM | POA: Diagnosis not present

## 2016-07-05 DIAGNOSIS — N400 Enlarged prostate without lower urinary tract symptoms: Secondary | ICD-10-CM | POA: Diagnosis not present

## 2016-07-05 DIAGNOSIS — E7800 Pure hypercholesterolemia, unspecified: Secondary | ICD-10-CM | POA: Diagnosis not present

## 2016-07-05 DIAGNOSIS — G4733 Obstructive sleep apnea (adult) (pediatric): Secondary | ICD-10-CM | POA: Diagnosis not present

## 2016-07-09 ENCOUNTER — Ambulatory Visit (INDEPENDENT_AMBULATORY_CARE_PROVIDER_SITE_OTHER): Payer: Medicare Other | Admitting: Ophthalmology

## 2016-07-09 DIAGNOSIS — H33303 Unspecified retinal break, bilateral: Secondary | ICD-10-CM

## 2016-07-09 DIAGNOSIS — H43813 Vitreous degeneration, bilateral: Secondary | ICD-10-CM

## 2016-07-09 DIAGNOSIS — H35412 Lattice degeneration of retina, left eye: Secondary | ICD-10-CM | POA: Diagnosis not present

## 2016-07-09 DIAGNOSIS — H2513 Age-related nuclear cataract, bilateral: Secondary | ICD-10-CM | POA: Diagnosis not present

## 2016-07-10 ENCOUNTER — Encounter (INDEPENDENT_AMBULATORY_CARE_PROVIDER_SITE_OTHER): Payer: Medicare Other | Admitting: Ophthalmology

## 2016-07-10 DIAGNOSIS — H33302 Unspecified retinal break, left eye: Secondary | ICD-10-CM | POA: Diagnosis not present

## 2016-07-18 ENCOUNTER — Ambulatory Visit (INDEPENDENT_AMBULATORY_CARE_PROVIDER_SITE_OTHER): Payer: Medicare Other | Admitting: Ophthalmology

## 2016-07-18 DIAGNOSIS — H33302 Unspecified retinal break, left eye: Secondary | ICD-10-CM

## 2016-07-30 DIAGNOSIS — M5416 Radiculopathy, lumbar region: Secondary | ICD-10-CM | POA: Diagnosis not present

## 2016-07-31 DIAGNOSIS — G4733 Obstructive sleep apnea (adult) (pediatric): Secondary | ICD-10-CM | POA: Diagnosis not present

## 2016-08-07 DIAGNOSIS — M545 Low back pain: Secondary | ICD-10-CM | POA: Diagnosis not present

## 2016-08-07 DIAGNOSIS — H00015 Hordeolum externum left lower eyelid: Secondary | ICD-10-CM | POA: Diagnosis not present

## 2016-08-09 DIAGNOSIS — M545 Low back pain: Secondary | ICD-10-CM | POA: Diagnosis not present

## 2016-08-22 DIAGNOSIS — H0015 Chalazion left lower eyelid: Secondary | ICD-10-CM | POA: Diagnosis not present

## 2016-08-22 DIAGNOSIS — H401131 Primary open-angle glaucoma, bilateral, mild stage: Secondary | ICD-10-CM | POA: Diagnosis not present

## 2016-09-12 DIAGNOSIS — H401131 Primary open-angle glaucoma, bilateral, mild stage: Secondary | ICD-10-CM | POA: Diagnosis not present

## 2016-10-31 DIAGNOSIS — H401131 Primary open-angle glaucoma, bilateral, mild stage: Secondary | ICD-10-CM | POA: Diagnosis not present

## 2016-11-18 ENCOUNTER — Ambulatory Visit (INDEPENDENT_AMBULATORY_CARE_PROVIDER_SITE_OTHER): Payer: Medicare Other | Admitting: Ophthalmology

## 2016-11-18 DIAGNOSIS — H33303 Unspecified retinal break, bilateral: Secondary | ICD-10-CM | POA: Diagnosis not present

## 2016-11-18 DIAGNOSIS — H43813 Vitreous degeneration, bilateral: Secondary | ICD-10-CM | POA: Diagnosis not present

## 2016-11-18 DIAGNOSIS — H2513 Age-related nuclear cataract, bilateral: Secondary | ICD-10-CM

## 2016-11-28 DIAGNOSIS — H2512 Age-related nuclear cataract, left eye: Secondary | ICD-10-CM | POA: Diagnosis not present

## 2016-11-28 DIAGNOSIS — H2513 Age-related nuclear cataract, bilateral: Secondary | ICD-10-CM | POA: Diagnosis not present

## 2016-11-28 DIAGNOSIS — H43811 Vitreous degeneration, right eye: Secondary | ICD-10-CM | POA: Diagnosis not present

## 2016-11-28 DIAGNOSIS — H401131 Primary open-angle glaucoma, bilateral, mild stage: Secondary | ICD-10-CM | POA: Diagnosis not present

## 2016-12-18 DIAGNOSIS — M1711 Unilateral primary osteoarthritis, right knee: Secondary | ICD-10-CM | POA: Diagnosis not present

## 2016-12-19 DIAGNOSIS — H2511 Age-related nuclear cataract, right eye: Secondary | ICD-10-CM | POA: Diagnosis not present

## 2016-12-19 DIAGNOSIS — H2512 Age-related nuclear cataract, left eye: Secondary | ICD-10-CM | POA: Diagnosis not present

## 2017-01-02 DIAGNOSIS — H2511 Age-related nuclear cataract, right eye: Secondary | ICD-10-CM | POA: Diagnosis not present

## 2017-01-16 DIAGNOSIS — Z23 Encounter for immunization: Secondary | ICD-10-CM | POA: Diagnosis not present

## 2017-01-22 DIAGNOSIS — M1712 Unilateral primary osteoarthritis, left knee: Secondary | ICD-10-CM | POA: Diagnosis not present

## 2017-01-30 DIAGNOSIS — D225 Melanocytic nevi of trunk: Secondary | ICD-10-CM | POA: Diagnosis not present

## 2017-01-30 DIAGNOSIS — D0359 Melanoma in situ of other part of trunk: Secondary | ICD-10-CM | POA: Diagnosis not present

## 2017-01-30 DIAGNOSIS — D485 Neoplasm of uncertain behavior of skin: Secondary | ICD-10-CM | POA: Diagnosis not present

## 2017-01-30 DIAGNOSIS — D1801 Hemangioma of skin and subcutaneous tissue: Secondary | ICD-10-CM | POA: Diagnosis not present

## 2017-01-30 DIAGNOSIS — L821 Other seborrheic keratosis: Secondary | ICD-10-CM | POA: Diagnosis not present

## 2017-01-30 DIAGNOSIS — D2361 Other benign neoplasm of skin of right upper limb, including shoulder: Secondary | ICD-10-CM | POA: Diagnosis not present

## 2017-01-30 DIAGNOSIS — C44619 Basal cell carcinoma of skin of left upper limb, including shoulder: Secondary | ICD-10-CM | POA: Diagnosis not present

## 2017-01-30 DIAGNOSIS — L929 Granulomatous disorder of the skin and subcutaneous tissue, unspecified: Secondary | ICD-10-CM | POA: Diagnosis not present

## 2017-02-18 DIAGNOSIS — D0359 Melanoma in situ of other part of trunk: Secondary | ICD-10-CM | POA: Diagnosis not present

## 2017-02-19 DIAGNOSIS — N401 Enlarged prostate with lower urinary tract symptoms: Secondary | ICD-10-CM | POA: Diagnosis not present

## 2017-02-19 DIAGNOSIS — R3914 Feeling of incomplete bladder emptying: Secondary | ICD-10-CM | POA: Diagnosis not present

## 2017-02-20 DIAGNOSIS — M1711 Unilateral primary osteoarthritis, right knee: Secondary | ICD-10-CM | POA: Diagnosis not present

## 2017-06-12 DIAGNOSIS — R001 Bradycardia, unspecified: Secondary | ICD-10-CM | POA: Diagnosis not present

## 2017-06-12 DIAGNOSIS — R002 Palpitations: Secondary | ICD-10-CM | POA: Diagnosis not present

## 2017-06-12 DIAGNOSIS — I059 Rheumatic mitral valve disease, unspecified: Secondary | ICD-10-CM | POA: Diagnosis not present

## 2017-06-12 DIAGNOSIS — Z8679 Personal history of other diseases of the circulatory system: Secondary | ICD-10-CM | POA: Diagnosis not present

## 2017-06-12 DIAGNOSIS — I483 Typical atrial flutter: Secondary | ICD-10-CM | POA: Diagnosis not present

## 2017-06-13 DIAGNOSIS — I728 Aneurysm of other specified arteries: Secondary | ICD-10-CM | POA: Diagnosis not present

## 2017-06-13 DIAGNOSIS — K900 Celiac disease: Secondary | ICD-10-CM | POA: Diagnosis not present

## 2017-07-15 DIAGNOSIS — H9193 Unspecified hearing loss, bilateral: Secondary | ICD-10-CM | POA: Diagnosis not present

## 2017-07-15 DIAGNOSIS — G4733 Obstructive sleep apnea (adult) (pediatric): Secondary | ICD-10-CM | POA: Diagnosis not present

## 2017-07-15 DIAGNOSIS — N5201 Erectile dysfunction due to arterial insufficiency: Secondary | ICD-10-CM | POA: Diagnosis not present

## 2017-07-15 DIAGNOSIS — G622 Polyneuropathy due to other toxic agents: Secondary | ICD-10-CM | POA: Diagnosis not present

## 2017-07-15 DIAGNOSIS — N400 Enlarged prostate without lower urinary tract symptoms: Secondary | ICD-10-CM | POA: Diagnosis not present

## 2017-07-15 DIAGNOSIS — I4892 Unspecified atrial flutter: Secondary | ICD-10-CM | POA: Diagnosis not present

## 2017-07-15 DIAGNOSIS — Z1389 Encounter for screening for other disorder: Secondary | ICD-10-CM | POA: Diagnosis not present

## 2017-07-15 DIAGNOSIS — E7800 Pure hypercholesterolemia, unspecified: Secondary | ICD-10-CM | POA: Diagnosis not present

## 2017-07-15 DIAGNOSIS — Z0000 Encounter for general adult medical examination without abnormal findings: Secondary | ICD-10-CM | POA: Diagnosis not present

## 2017-07-22 ENCOUNTER — Encounter: Payer: Self-pay | Admitting: Neurology

## 2017-07-23 ENCOUNTER — Telehealth: Payer: Self-pay | Admitting: Neurology

## 2017-07-23 ENCOUNTER — Encounter: Payer: Self-pay | Admitting: Neurology

## 2017-07-23 ENCOUNTER — Ambulatory Visit (INDEPENDENT_AMBULATORY_CARE_PROVIDER_SITE_OTHER): Payer: Medicare Other | Admitting: Neurology

## 2017-07-23 VITALS — Ht 73.0 in | Wt 220.0 lb

## 2017-07-23 DIAGNOSIS — G4733 Obstructive sleep apnea (adult) (pediatric): Secondary | ICD-10-CM

## 2017-07-23 DIAGNOSIS — G2581 Restless legs syndrome: Secondary | ICD-10-CM | POA: Diagnosis not present

## 2017-07-23 DIAGNOSIS — Z9989 Dependence on other enabling machines and devices: Secondary | ICD-10-CM | POA: Diagnosis not present

## 2017-07-23 DIAGNOSIS — G629 Polyneuropathy, unspecified: Secondary | ICD-10-CM | POA: Insufficient documentation

## 2017-07-23 NOTE — Patient Instructions (Signed)
Neuropathic Pain Neuropathic pain is pain caused by damage to the nerves that are responsible for certain sensations in your body (sensory nerves). The pain can be caused by damage to:  The sensory nerves that send signals to your spinal cord and brain (peripheral nervous system).  The sensory nerves in your brain or spinal cord (central nervous system).  Neuropathic pain can make you more sensitive to pain. What would be a minor sensation for most people may feel very painful if you have neuropathic pain. This is usually a long-term condition that can be difficult to treat. The type of pain can differ from person to person. It may start suddenly (acute), or it may develop slowly and last for a long time (chronic). Neuropathic pain may come and go as damaged nerves heal or may stay at the same level for years. It often causes emotional distress, loss of sleep, and a lower quality of life. What are the causes? The most common cause of damage to a sensory nerve is diabetes. Many other diseases and conditions can also cause neuropathic pain. Causes of neuropathic pain can be classified as:  Toxic. Many drugs and chemicals can cause toxic damage. The most common cause of toxic neuropathic pain is damage from drug treatment for cancer (chemotherapy).  Metabolic. This type of pain can happen when a disease causes imbalances that damage nerves. Diabetes is the most common of these diseases. Vitamin B deficiency caused by long-term alcohol abuse is another common cause.  Traumatic. Any injury that cuts, crushes, or stretches a nerve can cause damage and pain. A common example is feeling pain after losing an arm or leg (phantom limb pain).  Compression-related. If a sensory nerve gets trapped or compressed for a long period of time, the blood supply to the nerve can be cut off.  Vascular. Many blood vessel diseases can cause neuropathic pain by decreasing blood supply and oxygen to nerves.  Autoimmune.  This type of pain results from diseases in which the body's defense system mistakenly attacks sensory nerves. Examples of autoimmune diseases that can cause neuropathic pain include lupus and multiple sclerosis.  Infectious. Many types of viral infections can damage sensory nerves and cause pain. Shingles infection is a common cause of this type of pain.  Inherited. Neuropathic pain can be a symptom of many diseases that are passed down through families (genetic).  What are the signs or symptoms? The main symptom is pain. Neuropathic pain is often described as:  Burning.  Shock-like.  Stinging.  Hot or cold.  Itching.  How is this diagnosed? No single test can diagnose neuropathic pain. Your health care provider will do a physical exam and ask you about your pain. You may use a pain scale to describe how bad your pain is. You may also have tests to see if you have a high sensitivity to pain and to help find the cause and location of any sensory nerve damage. These tests may include:  Imaging studies, such as: ? X-rays. ? CT scan. ? MRI.  Nerve conduction studies to test how well nerve signals travel through your sensory nerves (electrodiagnostic testing).  Stimulating your sensory nerves through electrodes on your skin and measuring the response in your spinal cord and brain (somatosensory evoked potentials).  How is this treated? Treatment for neuropathic pain may change over time. You may need to try different treatment options or a combination of treatments. Some options include:  Over-the-counter pain relievers.  Prescription medicines. Some medicines   used to treat other conditions may also help neuropathic pain. These include medicines to: ? Control seizures (anticonvulsants). ? Relieve depression (antidepressants).  Prescription-strength pain relievers (narcotics). These are usually used when other pain relievers do not help.  Transcutaneous nerve stimulation (TENS).  This uses electrical currents to block painful nerve signals. The treatment is painless.  Topical and local anesthetics. These are medicines that numb the nerves. They can be injected as a nerve block or applied to the skin.  Alternative treatments, such as: ? Acupuncture. ? Meditation. ? Massage. ? Physical therapy. ? Pain management programs. ? Counseling.  Follow these instructions at home:  Learn as much as you can about your condition.  Take medicines only as directed by your health care provider.  Work closely with all your health care providers to find what works best for you.  Have a good support system at home.  Consider joining a chronic pain support group. Contact a health care provider if:  Your pain treatments are not helping.  You are having side effects from your medicines.  You are struggling with fatigue, mood changes, depression, or anxiety. This information is not intended to replace advice given to you by your health care provider. Make sure you discuss any questions you have with your health care provider. Document Released: 12/07/2003 Document Revised: 09/29/2015 Document Reviewed: 08/19/2013 Elsevier Interactive Patient Education  2018 [employer]  

## 2017-07-23 NOTE — Telephone Encounter (Signed)
Pt had lab testing completed in office today that we will send off via [place]. I have called and scheduled a pick up. Tracking # [id number]. Spoke with Debbie for standard overnight. Stated they will pick up today. Confirmation # [id number]

## 2017-07-23 NOTE — Progress Notes (Signed)
SLEEP MEDICINE CLINIC   Provider:  Larey Seat, M D  Primary Care Physician:  [name], MD   Referring Provider: Marius Ditch, MD   Chief Complaint  Patient presents with  . New Patient (Initial Visit)    pt has suffered with peripheral neuropathy for several years. has not followed with a doctor in over 20 years for this. He states that it has worse within the last couple years.     HPI:  Sean Stanley is a 77 y.o. male , seen here as in a referral  from Dr. Maxwell Caul and upon recommendation of [name] for evaluation of neuropathy.  Neuropathy has impaired his sleep.   I have the pleasure of seeing Sean Stanley today as a new patient to mostly practice on 23 Jul 2017.  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  About 25 years ago he began noticing numbness in his toes which from their ascended to the lower extremities.  It has not affected the upper extremities.  He does not have an associated skin rash.  He does not have raynaudes syndrome.  He has noted that when he is physically more active and more on his feet he will have more pain at night and sometimes also has muscle spasms in the lower extremities especially the gastrocnemius, described as a charley horse. He has been woken by these cramps.  It comes and goes, not lasting long but very, very painful.He is of [place] ancestry, [place].    Chief complaint according to patient : sleep disorder, neuropathy, agent orange exposure.  Sleep habits are as follows: The patient's bedtime is between 10 and 10:30 PM, the bedroom is cool, quiet dark. He sleeps within 15 minutes, but wakes up for 2-3 times due to nocturia. He has increased exercise recently, feels this improved his sleep time. Fragmented sleep, total  Sleep time of 5 hours in a good night. Naps in  daytime whenever he sits still. Wakes not refreshed, excessive daytime sleepiness. Wife stated he snores. -Was placed once on CPAP but quit using it. No family history of insomnia or neuropathy.   Sleep medical history and family sleep history:   Atrial fibrillation,  Dr. Esau Grew. cardioablation after cardioversions.   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  No smoking history, ETOH : drinks hard liquor, now only wine . Caffeine ; 1 cup in PM, decaffeinated- soda and no iced teas.     Review of Systems: Out of a complete 14 system review, the patient complains of only the following symptoms, and all other reviewed systems are negative.   Social History   Socioeconomic History  . Marital status: Married    Spouse name: Not on file  . Number of children: Not on file  . Years of education: Not on file  . Highest education level: Not on file  Occupational History  . Not on file  Social Needs  . Financial resource strain: Not on file  . Food insecurity:    Worry: Not on file    Inability: Not on file  . Transportation needs:    Medical: Not on file    Non-medical: Not on file  Tobacco Use  . Smoking status: Never Smoker  . Smokeless tobacco:  Never Used  Substance and Sexual Activity  . Alcohol use: Yes    Comment: social  . Drug use: No  . Sexual activity: Not on file  Lifestyle  . Physical activity:    Days per week: Not on file    Minutes per session: Not on file  . Stress: Not on file  Relationships  . Social connections:    Talks on phone: Not on file    Gets together: Not on file    Attends religious service: Not on file    Active member of club or organization: Not on file    Attends meetings of clubs or organizations: Not on file    Relationship status: Not on file  . Intimate partner violence:    Fear of current or ex partner: Not on file    Emotionally abused: Not on file    Physically abused: Not  on file    Forced sexual activity: Not on file  Other Topics Concern  . Not on file  Social History Narrative   Lives in [place], Retired    Family History  Problem Relation Age of Onset  . Parkinson's disease Mother 75  . Heart failure Father 55  . Cancer Maternal Grandmother   . Heart attack Maternal Grandfather   . Heart attack Paternal Grandmother   . Heart attack Paternal Grandfather     Past Medical History:  Diagnosis Date  . Atrial flutter ([place])   . BPH (benign prostatic hyperplasia)   . Headache   . Hypercholesteremia   . Hypercholesterolemia   . Mitral valve disease    annuloplasty 2002 [facility]  . Neuropathy   . OSA (obstructive sleep apnea)     Past Surgical History:  Procedure Laterality Date  . ABDOMINAL ANGIOGRAM  08/05/12  . aneurysm repair    . CARDIOVERSION  02/12/2012   Procedure: CARDIOVERSION;  Surgeon: Pixie Casino, MD;  Location: [facility] ENDOSCOPY;  Service: Cardiovascular;  Laterality: N/A;  . CARDIOVERSION N/A 06/22/2013   Procedure: CARDIOVERSION;  Surgeon: [name], MD;  Location: [place];  Service: Cardiovascular;  Laterality: N/A;  . CATARACT EXTRACTION  2018  . CHOLECYSTECTOMY    . MENISCUS REPAIR Right 2009  . MITRAL VALVE REPAIR  2002   [facility]  . NM MYOVIEW LTD  07/22/2006   no ischemia  . RIGHT HEART CATH  06/19/2004   normal right heart dynamics. EF 50%  . TEE WITHOUT CARDIOVERSION  02/12/2012   Procedure: TRANSESOPHAGEAL ECHOCARDIOGRAM (TEE);  Surgeon: Pixie Casino, MD;  Location: [facility] ENDOSCOPY;  Service: Cardiovascular;  Laterality: N/A;  . TEE WITHOUT CARDIOVERSION N/A 06/22/2013   Procedure: TRANSESOPHAGEAL ECHOCARDIOGRAM (TEE);  Surgeon: [name], MD;  Location: [facility] ENDOSCOPY;  Service: Cardiovascular;  Laterality: N/A;    Current Outpatient Medications  Medication Sig Dispense Refill  . alfuzosin (UROXATRAL) 10 MG 24 hr tablet Take 10 mg by mouth daily.    . carvedilol (COREG) 6.25 MG tablet Take 1 tablet  (6.25 mg total) by mouth 2 (two) times daily with a meal. (Patient taking differently: Take 6.25 mg by mouth daily. ) 270 tablet 1  . ezetimibe (ZETIA) 10 MG tablet Take 10 mg by mouth daily.    . finasteride (PROSCAR) 5 MG tablet TAKE 1 TABLET DAILY    . latanoprost (XALATAN) 0.005 % ophthalmic solution Place 1 drop into both eyes at bedtime.    . metoprolol succinate (TOPROL-XL) 25 MG 24 hr tablet Take one half tablet a day.    Marland Kitchen  candesartan (ATACAND) 4 MG tablet      No current facility-administered medications for this visit.     Allergies as of 07/23/2017 - Review Complete 07/23/2017  Allergen Reaction Noted  . Crestor [rosuvastatin] Other (See Comments) 02/12/2012  . Levaquin [levofloxacin in d5w]  07/22/2017  . Nickel  07/22/2017  . Statins Other (See Comments) 03/30/2013  . Ampicillin Rash 11/16/2012  . Penicillins Rash 02/12/2012    Vitals: Ht 6\' 1"  (1.854 m)   Wt 220 lb (99.8 kg)   BMI 29.03 kg/m  Last Weight:  Wt Readings from Last 1 Encounters:  07/23/17 220 lb (99.8 kg)   YBO:FBPZ mass index is 29.03 kg/m.     Last Height:   Ht Readings from Last 1 Encounters:  07/23/17 6\' 1"  (1.854 m)    Physical exam:  General: The patient is awake, alert and appears not in acute distress. The patient is well groomed. Head: Normocephalic, atraumatic. Neck is supple. Mallampati 4  neck circumference:17. 5 . Nasal airflow patent ,   Cardiovascular:  Regular rate and rhythm , without  murmurs or carotid bruit, and without distended neck veins. Status post mitral valve repair.  Respiratory: Lungs are clear to auscultation. Skin:  Without evidence of edema, or rash Trunk: BMI is 29. The patient's posture is erect   Neurologic exam : The patient is awake and alert, oriented to place and time.      Attention span & concentration ability appears normal.  Speech is fluent,  without   dysarthria, dysphonia or aphasia.  Mood and affect are appropriate.  Cranial nerves: Pupils  are equal and briskly reactive to light.Extraocular movements  in vertical and horizontal planes intact and without nystagmus. Visual fields by finger perimetry are intact. Hearing to finger rub intact.   Facial sensation intact to fine touch.  Facial motor strength is symmetric and tongue and uvula move midline. Shoulder shrug was symmetrical.   Motor exam:  Normal tone, muscle bulk and symmetric strength in all extremities. Sensory:  Fine touch, pinprick and vibration were tested in all extremities. Proprioception tested in the upper extremities was normal. Coordination: Rapid alternating movements in the fingers/hands was normal. Finger-to-nose maneuver  normal without evidence of ataxia, dysmetria or tremor. Gait and station: Patient walks without assistive device and is able unassisted to climb up to the exam table. Strength within normal limits. Stance is stable and normal. Tandem gait is unfragmented. Turns with 3  Steps.  Deep tendon reflexes: in the  upper and lower extremities are symmetric and intact. Babinski maneuver response is  downgoing.   Assessment:  After physical and neurologic examination, review of laboratory studies,  Personal review of imaging studies, reports of other /same  Imaging studies, results of polysomnography and / or neurophysiology testing and pre-existing records as far as provided in visit., my assessment is   1) neuropathy with pain, Pin and needles, cramping and burning. No associated skin dystrophy, cyanosis, all pulses palpable.   2) No family history, no genetic testing.  Agent orange exposure.   3) Sean Stanley was diagnosed with obstructive sleep apnea in the past, did not feel the benefit from CPAP therapy at the time, could not find a fitting mask.  He is still snoring and he has nocturia so it would be beneficial to see if he could not find a fitting nasal pillow or nasal cradle mask.   The patient was advised of the nature of the diagnosed disorder  , the treatment options  and the  risks for general health and wellness arising from not treating the condition.   I spent more than 45 minutes of face to face time with the patient.  Greater than 50% of time was spent in counseling and coordination of care. We have discussed the diagnosis and differential and I answered the patient's questions.    Plan:  Treatment plan and additional workup :  1) Neuropathy panel - genetic and inflammatory.  2) OSA ? Repeat a sleep study and SPLIT at [place], MD 09/28/8240, 3:53 PM  Certified in Neurology by ABPN Certified in [place] by [facility] [address], [place], [place] [address]

## 2017-07-25 LAB — NEUROPATHY PANEL
A/G Ratio: 1.5 (ref 0.7–1.7)
ALBUMIN ELP: 4 g/dL (ref 2.9–4.4)
Alpha 1: 0.2 g/dL (ref 0.0–0.4)
Alpha 2: 0.6 g/dL (ref 0.4–1.0)
Angio Convert Enzyme: 31 U/L (ref 14–82)
Anti Nuclear Antibody(ANA): NEGATIVE
BETA: 0.9 g/dL (ref 0.7–1.3)
Gamma Globulin: 0.9 g/dL (ref 0.4–1.8)
Globulin, Total: 2.6 g/dL (ref 2.2–3.9)
Sed Rate: 4 mm/hr (ref 0–30)
TSH: 1.39 u[IU]/mL (ref 0.450–4.500)
Total Protein: 6.6 g/dL (ref 6.0–8.5)
Vit D, 25-Hydroxy: 24.3 ng/mL — ABNORMAL LOW (ref 30.0–100.0)
Vitamin B-12: 362 pg/mL (ref 232–1245)

## 2017-07-29 ENCOUNTER — Telehealth: Payer: Self-pay | Admitting: Neurology

## 2017-07-29 NOTE — Telephone Encounter (Signed)
-----   Message from Larey Seat, MD sent at 07/25/2017 11:10 AM EDT ----- Part of the lab panel has returned. The only abnormality is the low Vit D level. He should start OTC 2000 IU Vit D twice a day by mouth. ANA was negative ( autoimmune factor) rheumatoid facto negative, sed rate low = normal,

## 2017-07-29 NOTE — Telephone Encounter (Signed)
Called to review some of the lab work that has come back. No answer. LVM for the pt to call back.

## 2017-07-31 NOTE — Telephone Encounter (Signed)
Called the pt back and went over the lab results that have come back. I informed him that Dr Brett Fairy is recommending the pt take a vitamin d supplement over the counter. Informed him of where to get this,dosage and how often to take. Informed him once we have the rest of the results we will call as well as the results from the other test he is scheduled for. Pt verbalized understanding. Pt had no questions at this time but was encouraged to call back if questions arise.

## 2017-07-31 NOTE — Telephone Encounter (Signed)
Pt returning RN's call.

## 2017-08-01 DIAGNOSIS — H40013 Open angle with borderline findings, low risk, bilateral: Secondary | ICD-10-CM | POA: Diagnosis not present

## 2017-08-20 ENCOUNTER — Telehealth: Payer: Self-pay

## 2017-08-20 ENCOUNTER — Encounter: Payer: Medicare Other | Admitting: Neurology

## 2017-08-20 NOTE — Telephone Encounter (Signed)
We have attempted to call the patient two times to schedule sleep study.  Patient has been unavailable at the phone numbers we have on file and has not returned our calls.  At this point we will send a letter asking patient to please contact the sleep lab to schedule their sleep study.  If patient calls back we will schedule them for their sleep study. 

## 2017-09-02 ENCOUNTER — Ambulatory Visit (INDEPENDENT_AMBULATORY_CARE_PROVIDER_SITE_OTHER): Payer: Medicare Other | Admitting: Neurology

## 2017-09-02 ENCOUNTER — Encounter: Payer: Self-pay | Admitting: Neurology

## 2017-09-02 DIAGNOSIS — L218 Other seborrheic dermatitis: Secondary | ICD-10-CM | POA: Diagnosis not present

## 2017-09-02 DIAGNOSIS — D2361 Other benign neoplasm of skin of right upper limb, including shoulder: Secondary | ICD-10-CM | POA: Diagnosis not present

## 2017-09-02 DIAGNOSIS — G629 Polyneuropathy, unspecified: Secondary | ICD-10-CM

## 2017-09-02 DIAGNOSIS — Z9989 Dependence on other enabling machines and devices: Principal | ICD-10-CM

## 2017-09-02 DIAGNOSIS — Z85820 Personal history of malignant melanoma of skin: Secondary | ICD-10-CM | POA: Diagnosis not present

## 2017-09-02 DIAGNOSIS — G4733 Obstructive sleep apnea (adult) (pediatric): Secondary | ICD-10-CM

## 2017-09-02 DIAGNOSIS — D2371 Other benign neoplasm of skin of right lower limb, including hip: Secondary | ICD-10-CM | POA: Diagnosis not present

## 2017-09-02 DIAGNOSIS — D485 Neoplasm of uncertain behavior of skin: Secondary | ICD-10-CM | POA: Diagnosis not present

## 2017-09-02 DIAGNOSIS — G2581 Restless legs syndrome: Secondary | ICD-10-CM

## 2017-09-02 DIAGNOSIS — D1801 Hemangioma of skin and subcutaneous tissue: Secondary | ICD-10-CM | POA: Diagnosis not present

## 2017-09-02 DIAGNOSIS — L814 Other melanin hyperpigmentation: Secondary | ICD-10-CM | POA: Diagnosis not present

## 2017-09-02 DIAGNOSIS — Z85828 Personal history of other malignant neoplasm of skin: Secondary | ICD-10-CM | POA: Diagnosis not present

## 2017-09-02 DIAGNOSIS — D225 Melanocytic nevi of trunk: Secondary | ICD-10-CM | POA: Diagnosis not present

## 2017-09-02 NOTE — Progress Notes (Signed)
Please refer to EMG and nerve conduction study procedure note. 

## 2017-09-02 NOTE — Procedures (Signed)
     HISTORY:  Sean Stanley is a 77 year old gentleman with a history of numbness in the feet dating back about 20 years, the patient has had some mild gait instability issues, he comes in for an evaluation of the possible peripheral neuropathy.  NERVE CONDUCTION STUDIES:  Nerve conduction studies were performed on the right upper extremity. The distal motor latencies and motor amplitudes for the median and ulnar nerves were within normal limits. The nerve conduction velocities for these nerves were also normal. The sensory latencies for the median and ulnar nerves were normal.  The F-wave latency for the ulnar nerve was normal.  Nerve conduction studies were performed on both lower extremities.  The distal motor latencies for the peroneal nerves were prolonged bilaterally, absent for the posterior tibial nerves bilaterally.  The motor amplitudes for the peroneal nerves were low and there was slowing seen above and below the fibular head for the peroneal nerves.  The sural sensory latencies surprisingly were normal but the peroneal sensory latencies were unobtainable.  The F-wave latencies for the posterior tibial nerves were unobtainable bilaterally.  EMG STUDIES:  EMG study was performed on the right lower extremity:  The tibialis anterior muscle reveals 2 to 5K motor units with decreased recruitment. No fibrillations or positive waves were seen. The peroneus tertius muscle reveals 2 to 4K motor units with moderately decreased recruitment. No fibrillations or positive waves were seen. The medial gastrocnemius muscle reveals up to 8K motor units with decreased recruitment. No fibrillations or positive waves were seen. The vastus lateralis muscle reveals 2 to 4K motor units with full recruitment. No fibrillations or positive waves were seen. The iliopsoas muscle reveals 2 to 4K motor units with full recruitment. No fibrillations or positive waves were seen. The biceps femoris muscle (long head)  reveals 2 to 4K motor units with full recruitment. No fibrillations or positive waves were seen. The lumbosacral paraspinal muscles were tested at 3 levels, and revealed no abnormalities of insertional activity at all 3 levels tested. There was good relaxation.   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  EMG evaluation of the right lower extremity shows chronic stable distal signs of neuropathic denervation consistent with the diagnosis of peripheral neuropathy.  There is no evidence of an overlying lumbosacral radiculopathy.  [name] 09/02/2017 4:06 PM  [facility] [address] [place], [place] [address]  Phone [phone] Fax [phone]

## 2017-09-03 NOTE — Progress Notes (Signed)
[place]    Nerve / Sites Muscle Latency Ref. Amplitude Ref. Rel Amp Segments Distance Velocity Ref. Area    ms ms mV mV %  cm m/s m/s mVms  R Median - APB     Wrist APB 3.9 ?4.4 8.1 ?4.0 100 Wrist - APB 7   29.3     Upper arm APB 8.9  7.3  90.8 Upper arm - Wrist 25 49 ?49 27.3  R Ulnar - ADM     Wrist ADM 3.1 ?3.3 8.3 ?6.0 100 Wrist - ADM 7   25.8     B.Elbow ADM 7.8  7.6  91.6 B.Elbow - Wrist 23 49 ?49 25.0     A.Elbow ADM 10.2  7.2  95.3 A.Elbow - B.Elbow 12 51 ?49 25.9         A.Elbow - Wrist      R Peroneal - EDB     Ankle EDB 6.5 ?6.5 0.4 ?2.0 100 Ankle - EDB 9   0.9     Fib head EDB 16.5  0.6  137 Fib head - Ankle 36 36 ?44 1.7     Pop fossa EDB 19.3  0.6  98.9 Pop fossa - Fib head 10 35 ?44 1.6         Pop fossa - Ankle      L Peroneal - EDB     Ankle EDB 6.6 ?6.5 0.8 ?2.0 100 Ankle - EDB 9   1.6     Fib head EDB 17.2  0.7  93.7 Fib head - Ankle 36 34 ?44 1.9     Pop fossa EDB 20.1  0.7  96.6 Pop fossa - Fib head 10 35 ?44 1.8         Pop fossa - Ankle      L Tibial - AH     Ankle AH NR ?5.8 NR ?4.0 NR Ankle - AH 9   NR     Pop fossa AH NR  NR  NR Pop fossa - Ankle   ?41 NR  R Tibial - AH     Ankle AH NR ?5.8 NR ?4.0 NR Ankle - AH 9   NR     Pop fossa AH NR  NR  NR Pop fossa - Ankle 42 NR ?41 NR                 SNC    Nerve / Sites Rec. Site Peak Lat Ref.  Amp Ref. Segments Distance    ms ms V V  cm  R Sural - Ankle (Calf)     Calf Ankle 4.2 ?4.4 3 ?6 Calf - Ankle 14  L Sural - Ankle (Calf)     Calf Ankle 4.3 ?4.4 2 ?6 Calf - Ankle 14  R Superficial peroneal - Ankle     Lat leg Ankle NR ?4.4 NR ?6 Lat leg - Ankle 14  L Superficial peroneal - Ankle     Lat leg Ankle NR ?4.4 NR ?6 Lat leg - Ankle 14  R Median - Orthodromic (Dig II, Mid palm)     Dig II Wrist 3.6 ?3.4 7 ?10 Dig II - Wrist 13  R Ulnar - Orthodromic, (Dig V, Mid palm)     Dig V Wrist 3.0 ?3.1 6 ?5 Dig V - Wrist 58                 F  Wave    Nerve F Lat Ref.   ms  ms  R Ulnar - ADM 31.9 ?32.0

## 2017-09-05 DIAGNOSIS — H40013 Open angle with borderline findings, low risk, bilateral: Secondary | ICD-10-CM | POA: Diagnosis not present

## 2017-09-05 DIAGNOSIS — H04123 Dry eye syndrome of bilateral lacrimal glands: Secondary | ICD-10-CM | POA: Diagnosis not present

## 2017-09-12 ENCOUNTER — Ambulatory Visit (INDEPENDENT_AMBULATORY_CARE_PROVIDER_SITE_OTHER): Payer: Medicare Other | Admitting: Neurology

## 2017-09-12 DIAGNOSIS — G4733 Obstructive sleep apnea (adult) (pediatric): Secondary | ICD-10-CM

## 2017-09-12 DIAGNOSIS — G629 Polyneuropathy, unspecified: Secondary | ICD-10-CM

## 2017-09-12 DIAGNOSIS — G2581 Restless legs syndrome: Secondary | ICD-10-CM

## 2017-09-12 DIAGNOSIS — Z9989 Dependence on other enabling machines and devices: Principal | ICD-10-CM

## 2017-09-13 NOTE — Procedures (Signed)
PATIENT'S NAME:  Sean Stanley, Sean Stanley DOB:      1940-10-18      MR#:    [id number]     DATE OF RECORDING: 09/12/2017 REFERRING M.D.:  Nehemiah Settle, M.D. Study Performed:   Baseline Polysomnogram HISTORY: Sean Stanley is a 77 y.o. male [place] with a possible Agent Orange exposure related history of neuropathy. Neuropathy has impaired his sleep. 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  About 25 years ago he began noticing numbness in his toes which from their ascended through the lower extremities.  It has not affected the upper extremities. He does not have an associated skin rash, does not have Raynaud's syndrome.  He has noted that when he is physically more active "on his feet" he will have more pain at night - sometimes also has muscle spasms in the lower extremities, especially the gastrocnemius, described as a charley horse. He has been woken by these cramps. Cramps come and go, not lasting long but very, very painful.  Fragmented sleep, total sleep time of 5 hours in a good night. 2-3 times nocturia. Takes naps in daytime whenever he sits still. Wakes in AM not refreshed, excessive daytime sleepiness. Wife stated he snores. -Was placed once on CPAP but quit using it.   The patient endorsed the Epworth Sleepiness Scale at 14 points.   The patient's weight 220 pounds with a height of 73 (inches), resulting in a BMI of 29.2 kg/m2. The patient's neck circumference measured 17.5 inches.  CURRENT MEDICATIONS: Uroxatral, Coreg, Zetia, Proscar, Xalatan, Toprol-XL, Atacand   PROCEDURE:  This is a multichannel digital polysomnogram utilizing the Somnostar 11.2 system.  Electrodes and sensors were applied and monitored per AASM Specifications.   EEG, EOG, Chin and Limb EMG, were sampled at 200 Hz.  ECG, Snore and Nasal Pressure, Thermal Airflow, Respiratory  Effort, CPAP Flow and Pressure, Oximetry was sampled at 50 Hz. Digital video and audio were recorded.      BASELINE STUDY: Lights Out was at 22:03 and Lights On at 05:01.  Total recording time (TRT) was 418 minutes, with a total sleep time (TST) of 224 minutes.  The patient's sleep latency was 44 minutes.  REM latency was 90 minutes. The sleep efficiency was poor at 53.6 %.     SLEEP ARCHITECTURE: WASO (Wake after sleep onset) was 168 minutes.  There were 18.5 minutes in Stage N1, 142 minutes Stage N2, 10.5 minutes Stage N3 and 53 minutes in Stage REM.  The percentage of Stage N1 was 8.3%, Stage N2 was 63.4%, Stage N3 was 4.7% and Stage R (REM sleep) was 23.7%.  RESPIRATORY ANALYSIS:  There were a total of 68 respiratory events:  38 obstructive apneas, 1 central apneas and 0 mixed apneas with a total of 39 apneas and an apnea index (AI) of 10.4 /hour. There were 29 hypopneas with a hypopnea index of 7.8 /hour. The patient also had 0 respiratory event related arousals (RERAs).      The total APNEA/HYPOPNEA INDEX (AHI) was 18.2/hour and the total RESPIRATORY DISTURBANCE INDEX was 18.2 /hour.  3 events occurred in REM sleep and 55 events in NREM. The REM AHI was 3.4 /hour, versus a non-REM AHI of 22.8. The patient spent 47.5 minutes of total sleep time in the supine position and 177 minutes in non-supine. The supine AHI  was 75.8/h versus a non-supine AHI of 2.7.  OXYGEN SATURATION & C02:  The Wake baseline 02 saturation was 96%, with the lowest being 88%. Time spent below 89% saturation equaled 0 minutes.   PERIODIC LIMB MOVEMENTS:  The patient had a total of 5 Periodic Limb Movements.  The Periodic Limb Movement (PLM) index was 1.3 and the PLM Arousal index was 0.3/hour. The arousals were noted as: 50 were spontaneous, 1 associated with PLMs, and 62 were associated with respiratory events.  Audio and video analysis did not show any abnormal or unusual movements, behaviors, phonations or vocalizations.    The patient took one bathroom breaks. Snoring was noted in non -supine position. EKG was in keeping with normal sinus rhythm (NSR) and PACs and PVCs. Post-study, the patient indicated that sleep was the same as usual.   IMPRESSION:  1. Mild Obstructive Sleep Apnea(OSA) became severe in supine sleep position ( AHI of 18.2 became 75.8/h)  2. Clinically not relevant Periodic Limb Movements (PLMD) 3. No hypoxemia noted. 4. Prolonged sleep latency and wake up periods after sleep initiation, overall reduced sleep efficiency.   RECOMMENDATIONS:  1. The poor sleep efficiency and prolonged periods of wakefulness which fragment sleep may improve under apnea and snoring therapy.  2. CPAP and dental device are possible treatment options for this uncomplicated Apnea component.  3. Avoiding supine sleep is effective as well.  4. Weight loss is recommended.  5. There should be an improvement in Insomnia and sleep architecture under medication therapy, options include Baclofen, Flexaril and Klonopin.  6. *I will offer a full-night, attended, CPAP titration study to optimize therapy. I have not placed an order, pending the patient's response.     I certify that I have reviewed the entire raw data recording prior to the issuance of this report in accordance with the Standards of Accreditation of the [place] [employer] (AASM)  Larey Seat, MD    09-13-2017  Diplomat, American Board of Psychiatry and Neurology  Diplomat, American Board of [place] Director, [employer] [facility] [employer]

## 2017-09-16 ENCOUNTER — Telehealth: Payer: Self-pay

## 2017-09-16 NOTE — Telephone Encounter (Signed)
Pt returned my call. I discussed his sleep study results with him, as well as Dr. Edwena Felty recommendations. I explained that he showed mild osa that became severe in supine sleep. Pt would like to wait to discuss subsequent treatment options until his appt in August. Pt asked me to move his appt up sooner, and it was moved to 10/28/17 at 10:30am. Pt asked me to send a copy of his sleep study to [name]. Pt verbalized understanding of results. Pt had no questions at this time but was encouraged to call back if questions arise.

## 2017-09-16 NOTE — Telephone Encounter (Signed)
I called pt, spoke to pt's wife, per DPR. She will ask pt to call me back.

## 2017-09-16 NOTE — Telephone Encounter (Signed)
-----   Message from Larey Seat, MD sent at 09/13/2017  6:24 PM EDT ----- IMPRESSION:  1. Mild Obstructive Sleep Apnea(OSA) became severe in supine sleep position ( AHI of 18.2 became 75.8/h)  2. Clinically not relevant Periodic Limb Movements (PLMD) 3. No hypoxemia noted. 4. Prolonged sleep latency and wake up periods after sleep initiation, overall reduced sleep efficiency.   RECOMMENDATIONS:  1. The poor sleep efficiency and prolonged periods of wakefulness which fragment sleep may improve under apnea and snoring therapy.   2. CPAP and dental device are possible treatment options for this uncomplicated Apnea component.  3. Avoiding supine sleep is effective as well.  4. Weight loss is recommended.  5. There should be an improvement in Insomnia and sleep architecture under medication therapy, options include Baclofen, Flexaril and Klonopin.  6. #I will offer a full-night, attended, CPAP titration study to optimize therapy. I have not placed an order, pending the patient's response.

## 2017-09-23 DIAGNOSIS — D485 Neoplasm of uncertain behavior of skin: Secondary | ICD-10-CM | POA: Diagnosis not present

## 2017-09-23 DIAGNOSIS — L988 Other specified disorders of the skin and subcutaneous tissue: Secondary | ICD-10-CM | POA: Diagnosis not present

## 2017-10-27 DIAGNOSIS — Z23 Encounter for immunization: Secondary | ICD-10-CM | POA: Diagnosis not present

## 2017-10-28 ENCOUNTER — Encounter: Payer: Self-pay | Admitting: Neurology

## 2017-10-28 ENCOUNTER — Ambulatory Visit (INDEPENDENT_AMBULATORY_CARE_PROVIDER_SITE_OTHER): Payer: Medicare Other | Admitting: Neurology

## 2017-10-28 VITALS — BP 113/70 | HR 78 | Ht 73.0 in | Wt 219.0 lb

## 2017-10-28 DIAGNOSIS — G4733 Obstructive sleep apnea (adult) (pediatric): Secondary | ICD-10-CM | POA: Diagnosis not present

## 2017-10-28 DIAGNOSIS — G629 Polyneuropathy, unspecified: Secondary | ICD-10-CM | POA: Diagnosis not present

## 2017-10-28 DIAGNOSIS — G8929 Other chronic pain: Secondary | ICD-10-CM | POA: Insufficient documentation

## 2017-10-28 DIAGNOSIS — G4701 Insomnia due to medical condition: Secondary | ICD-10-CM

## 2017-10-28 DIAGNOSIS — G608 Other hereditary and idiopathic neuropathies: Secondary | ICD-10-CM

## 2017-10-28 MED ORDER — TRAZODONE HCL 50 MG PO TABS: 25.0000 mg | ORAL_TABLET | Freq: Every evening | ORAL | 2 refills | Status: DC | PRN

## 2017-10-28 NOTE — Patient Instructions (Signed)
Trazodone tablets What is this medicine? TRAZODONE (TRAZ oh done) is used to treat depression. This medicine may be used for other purposes; ask your health care provider or pharmacist if you have questions. COMMON BRAND NAME(S): Desyrel What should I tell my health care provider before I take this medicine? They need to know if you have any of these conditions: -attempted suicide or thinking about it -bipolar disorder -bleeding problems -glaucoma -heart disease, or previous heart attack -irregular heart beat -kidney or liver disease -low levels of sodium in the blood -an unusual or allergic reaction to trazodone, other medicines, foods, dyes or preservatives -pregnant or trying to get pregnant -breast-feeding How should I use this medicine? Take this medicine by mouth with a glass of water. Follow the directions on the prescription label. Take this medicine shortly after a meal or a light snack. Take your medicine at regular intervals. Do not take your medicine more often than directed. Do not stop taking this medicine suddenly except upon the advice of your doctor. Stopping this medicine too quickly may cause serious side effects or your condition may worsen. A special MedGuide will be given to you by the pharmacist with each prescription and refill. Be sure to read this information carefully each time. Talk to your pediatrician regarding the use of this medicine in children. Special care may be needed. Overdosage: If you think you have taken too much of this medicine contact a poison control center or emergency room at once. NOTE: This medicine is only for you. Do not share this medicine with others. What if I miss a dose? If you miss a dose, take it as soon as you can. If it is almost time for your next dose, take only that dose. Do not take double or extra doses. What may interact with this medicine? Do not take this medicine with any of the following medications: -certain medicines  for fungal infections like fluconazole, itraconazole, ketoconazole, posaconazole, voriconazole -cisapride -dofetilide -dronedarone -linezolid -MAOIs like Carbex, Eldepryl, Marplan, Nardil, and Parnate -mesoridazine -methylene blue (injected into a vein) -pimozide -saquinavir -thioridazine -ziprasidone This medicine may also interact with the following medications: -alcohol -antiviral medicines for HIV or AIDS -aspirin and aspirin-like medicines -barbiturates like phenobarbital -certain medicines for blood pressure, heart disease, irregular heart beat -certain medicines for depression, anxiety, or psychotic disturbances -certain medicines for migraine headache like almotriptan, eletriptan, frovatriptan, naratriptan, rizatriptan, sumatriptan, zolmitriptan -certain medicines for seizures like carbamazepine and phenytoin -certain medicines for sleep -certain medicines that treat or prevent blood clots like dalteparin, enoxaparin, warfarin -digoxin -fentanyl -lithium -NSAIDS, medicines for pain and inflammation, like ibuprofen or naproxen -other medicines that prolong the QT interval (cause an abnormal heart rhythm) -rasagiline -supplements like St. John's wort, kava kava, valerian -tramadol -tryptophan This list may not describe all possible interactions. Give your health care provider a list of all the medicines, herbs, non-prescription drugs, or dietary supplements you use. Also tell them if you smoke, drink alcohol, or use illegal drugs. Some items may interact with your medicine. What should I watch for while using this medicine? Tell your doctor if your symptoms do not get better or if they get worse. Visit your doctor or health care professional for regular checks on your progress. Because it may take several weeks to see the full effects of this medicine, it is important to continue your treatment as prescribed by your doctor. Patients and their families should watch out for new  or worsening thoughts of suicide or depression. Also   watch out for sudden changes in feelings such as feeling anxious, agitated, panicky, irritable, hostile, aggressive, impulsive, severely restless, overly excited and hyperactive, or not being able to sleep. If this happens, especially at the beginning of treatment or after a change in dose, call your health care professional. You may get drowsy or dizzy. Do not drive, use machinery, or do anything that needs mental alertness until you know how this medicine affects you. Do not stand or sit up quickly, especially if you are an older patient. This reduces the risk of dizzy or fainting spells. Alcohol may interfere with the effect of this medicine. Avoid alcoholic drinks. This medicine may cause dry eyes and blurred vision. If you wear contact lenses you may feel some discomfort. Lubricating drops may help. See your eye doctor if the problem does not go away or is severe. Your mouth may get dry. Chewing sugarless gum, sucking hard candy and drinking plenty of water may help. Contact your doctor if the problem does not go away or is severe. What side effects may I notice from receiving this medicine? Side effects that you should report to your doctor or health care professional as soon as possible: -allergic reactions like skin rash, itching or hives, swelling of the face, lips, or tongue -elevated mood, decreased need for sleep, racing thoughts, impulsive behavior -confusion -fast, irregular heartbeat -feeling faint or lightheaded, falls -feeling agitated, angry, or irritable -loss of balance or coordination -painful or prolonged erections -restlessness, pacing, inability to keep still -suicidal thoughts or other mood changes -tremors -trouble sleeping -seizures -unusual bleeding or bruising Side effects that usually do not require medical attention (report to your doctor or health care professional if they continue or are bothersome): -change in  sex drive or performance -change in appetite or weight -constipation -headache -muscle aches or pains -nausea This list may not describe all possible side effects. Call your doctor for medical advice about side effects. You may report side effects to FDA at 1-800-FDA-1088. Where should I keep my medicine? Keep out of the reach of children. Store at room temperature between 15 and 30 degrees C (59 to 86 degrees F). Protect from light. Keep container tightly closed. Throw away any unused medicine after the expiration date. NOTE: This sheet is a summary. It may not cover all possible information. If you have questions about this medicine, talk to your doctor, pharmacist, or health care provider.  2018 Elsevier/Gold Standard (2015-08-10 16:57:05)  

## 2017-10-28 NOTE — Progress Notes (Signed)
[facility]   Provider:  Larey Seat, M D  Primary Care Physician:  [name], MD   Referring Provider: [name], MD   Chief Complaint  Patient presents with  . Follow-up    pt with wife, rm 70. pt states that things are the same maybe slighty worse. he has started exercising more and has found that to be troublesome.     HPI:  JAKARI SADA is a 77 y.o. male , seen here as in a referral  from [name] and upon recommendation of [name] for evaluation of neuropathy.  Neuropathy has impaired his sleep.  He is relieved to hear that his apnea was strictly positional, no hypoxemia and no PLMs. He had nocturia and is followed by Dr Marveen Reeks.   Today's 28 October 2017, and that he was summarized the patient's baseline polysomnography on 12 September 2017.  The apnea hypopnea index was 18.2, in supine position 75.8 apneas per hour, in nonsupine position 2.7/h.  There was no REM exenteration.  There was no evidence of hypoxemia time spent below 89% oxygen saturation was 0.1-minute.  The patient had only 5 periodic limb movements but many spontaneous arousals that may have been pain or discomfort related, the EKG was not keeping with a sinus rhythm he had some intermittent PACs and PVCs.  Facet the patient would not require CPAP intervention if he could train him to sleep on his side.  We discussed the tennis ball method or other behavior changing methods to help him avoid supine sleep.  I would also mentioned to say that he will sleep better once he is entrained into the sleep position.   His wife was also interested in the neuropathy work up- he has only had a low Vit D level, no autoimmune evidence. His EMG was interrepted by Dr Jannifer Franklin. No radiculopathy- he has sensory issues, pain and feels numb in both feet.  He likes the foot spa, jet massage . Wearing supportive shoes, treating pain.  Using trazodone for sleep- avoiding anticholinergic agents.  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 Wife asked about hemp topical oils.      I have the pleasure of seeing Mr. Ghuman as a new patient to mostly practice on 23 Jul 2017.  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  About 25 years ago he began noticing numbness in his toes which from their ascended to the lower extremities.  It has not affected the upper extremities.  He does not have an associated skin rash.  He does not have raynaudes syndrome.  He has noted that when he is physically more active and more on his feet he will have more pain at night and sometimes also has muscle spasms in the lower extremities especially the gastrocnemius, described as a charley horse. He has been woken by these cramps.  It comes and goes, not lasting long but very, very painful.He is of [place] ancestry, [place].    Chief complaint according to patient : sleep disorder, neuropathy, agent orange exposure.  Sleep habits are as follows: The patient's bedtime is between 10 and 10:30 PM, the bedroom is cool, quiet dark. He sleeps within 15 minutes, but wakes up for 2-3 times due to  nocturia. He has increased exercise recently, feels this improved his sleep time. Fragmented sleep, total  Sleep time of 5 hours in a good night. Naps in daytime whenever he sits still. Wakes not refreshed, excessive daytime sleepiness. Wife stated he snores. -Was placed once on CPAP but quit using it. No family history of insomnia or neuropathy.   Sleep medical history and family sleep history:   Atrial fibrillation,  Dr. Esau Grew. cardioablation after cardioversions.   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  No smoking history, ETOH : drinks hard liquor, now only wine . Caffeine  ; 1 cup in PM, decaffeinated- soda and no iced teas.     Review of Systems: Out of a complete 14 system review, the patient complains of only the following symptoms, and all other reviewed systems are negative.   Social History   Socioeconomic History  . Marital status: Married    Spouse name: Not on file  . Number of children: Not on file  . Years of education: Not on file  . Highest education level: Not on file  Occupational History  . Not on file  Social Needs  . Financial resource strain: Not on file  . Food insecurity:    Worry: Not on file    Inability: Not on file  . Transportation needs:    Medical: Not on file    Non-medical: Not on file  Tobacco Use  . Smoking status: Never Smoker  . Smokeless tobacco: Never Used  Substance and Sexual Activity  . Alcohol use: Yes    Comment: social  . Drug use: No  . Sexual activity: Not on file  Lifestyle  . Physical activity:    Days per week: Not on file    Minutes per session: Not on file  . Stress: Not on file  Relationships  . Social connections:    Talks on phone: Not on file    Gets together: Not on file    Attends religious service: Not on file    Active member of club or organization: Not on file    Attends meetings of clubs or organizations: Not on file    Relationship status: Not on file  . Intimate partner violence:    Fear of current or ex partner: Not on file    Emotionally abused: Not on file    Physically abused: Not on file    Forced sexual activity: Not on file  Other Topics Concern  . Not on file  Social History Narrative   Lives in [place], Retired    Family History  Problem Relation Age of Onset  . Parkinson's disease Mother 40  . Heart failure Father 16  . Cancer Maternal Grandmother   . Heart attack Maternal Grandfather   . Heart attack Paternal Grandmother   . Heart attack Paternal Grandfather     Past Medical History:  Diagnosis Date  . Atrial flutter ([place])   . BPH (benign  prostatic hyperplasia)   . Headache   . Hypercholesteremia   . Hypercholesterolemia   . Mitral valve disease    annuloplasty 2002 [facility]  . Neuropathy   . OSA (obstructive sleep apnea)     Past Surgical History:  Procedure Laterality Date  . ABDOMINAL ANGIOGRAM  08/05/12  . aneurysm repair    . CARDIOVERSION  02/12/2012   Procedure: CARDIOVERSION;  Surgeon: Pixie Casino, MD;  Location: [facility];  Service: Cardiovascular;  Laterality:  N/A;  . CARDIOVERSION N/A 06/22/2013   Procedure: CARDIOVERSION;  Surgeon: [name], MD;  Location: [facility];  Service: Cardiovascular;  Laterality: N/A;  . CATARACT EXTRACTION  2018  . CHOLECYSTECTOMY    . MENISCUS REPAIR Right 2009  . MITRAL VALVE REPAIR  2002   [facility]  . NM MYOVIEW LTD  07/22/2006   no ischemia  . RIGHT HEART CATH  06/19/2004   normal right heart dynamics. EF 50%  . TEE WITHOUT CARDIOVERSION  02/12/2012   Procedure: TRANSESOPHAGEAL ECHOCARDIOGRAM (TEE);  Surgeon: Pixie Casino, MD;  Location: [facility] ENDOSCOPY;  Service: Cardiovascular;  Laterality: N/A;  . TEE WITHOUT CARDIOVERSION N/A 06/22/2013   Procedure: TRANSESOPHAGEAL ECHOCARDIOGRAM (TEE);  Surgeon: [name], MD;  Location: [facility] ENDOSCOPY;  Service: Cardiovascular;  Laterality: N/A;    Current Outpatient Medications  Medication Sig Dispense Refill  . alfuzosin (UROXATRAL) 10 MG 24 hr tablet Take 10 mg by mouth daily.    . candesartan (ATACAND) 4 MG tablet     . Cholecalciferol (VITAMIN D) 2000 units tablet Take 2,000 Units by mouth daily.    Marland Kitchen ezetimibe (ZETIA) 10 MG tablet Take 10 mg by mouth daily.    . finasteride (PROSCAR) 5 MG tablet TAKE 1 TABLET DAILY    . metoprolol succinate (TOPROL-XL) 25 MG 24 hr tablet Take one half tablet a day.     No current facility-administered medications for this visit.     Allergies as of 10/28/2017 - Review Complete 10/28/2017  Allergen Reaction Noted  . Crestor [rosuvastatin] Other (See Comments) 02/12/2012    . Levaquin [levofloxacin in d5w]  07/22/2017  . Nickel  07/22/2017  . Statins Other (See Comments) 03/30/2013  . Ampicillin Rash 11/16/2012  . Penicillins Rash 02/12/2012    Vitals: BP 113/70   Pulse 78   Ht 6\' 1"  (1.854 m)   Wt 219 lb (99.3 kg)   BMI 28.89 kg/m  Last Weight:  Wt Readings from Last 1 Encounters:  10/28/17 219 lb (99.3 kg)   RKY:HCWC mass index is 28.89 kg/m.     Last Height:   Ht Readings from Last 1 Encounters:  10/28/17 6\' 1"  (1.854 m)    Physical exam:  General: The patient is awake, alert and appears not in acute distress. The patient is well groomed. Head: Normocephalic, atraumatic. Neck is supple. Mallampati 4  neck circumference:17. 5 . Nasal airflow patent ,   Cardiovascular:  Regular rate and rhythm , without  murmurs or carotid bruit, and without distended neck veins. Status post mitral valve repair.  Respiratory: Lungs are clear to auscultation. Skin:  Without evidence of edema, or rash Trunk: BMI is 29. The patient's posture is erect   Neurologic exam : The patient is awake and alert, oriented to place and time.   Attention span & concentration ability appears normal.  Speech is fluent,  without   dysarthria, dysphonia or aphasia.  Mood and affect are appropriate.  Cranial nerves: No change in taste or smell. Pupils are equal and reactive to light.Extraocular movements  in vertical and horizontal planes intact and without nystagmus. Visual fields by finger perimetry are intact.Hearing to finger rub intact.   Facial sensation intact to fine touch.  Facial motor strength is symmetric and tongue and uvula move midline. Shoulder shrug was symmetrical.   Motor exam:  Normal tone, muscle bulk and symmetric strength in all extremities. Sensory:  Fine touch, pinprick and vibration were tested in all extremities. Proprioception tested in the  upper extremities was normal. Coordination: Rapid alternating movements in the fingers/hands was normal.  Finger-to-nose maneuver  normal without evidence of ataxia, dysmetria or tremor. Gait and station: Patient walks without assistive device and is able unassisted to climb up to the exam table. Strength within normal limits. Stance is stable and normal. Tandem gait is unfragmented. Turns with 3  Steps.  Deep tendon reflexes: in the  upper and lower extremities are symmetric and intact. Babinski maneuver response is  downgoing.   Assessment:  After physical and neurologic examination, review of laboratory studies,  Personal review of imaging studies, reports of other /same  Imaging studies, results of polysomnography and / or neurophysiology testing and pre-existing records as far as provided in visit., my assessment is   1)confirmed by NCV and EMG is his neuropathy with pain, Pin and needles, cramping and burning. No associated skin dystrophy, cyanosis, all pulses palpable.this affects his sleep.  mother had parkinson's disease. No family history of NP , no genetic testing.  Had Agent orange exposure.   2) Mr. Gladish was diagnosed with mild uncomplicated obstructive sleep apnea in the past, did not feel the benefit from CPAP therapy at the time, could not find a fitting mask.  He is still snoring and he has nocturia so it would be beneficial to see if he could not find a fitting nasal pillow or nasal cradle mask. He will do better avoiding supine sleep - tennisball method explain.    3) memory concern, delayed word finding.    The patient was advised of the nature of the diagnosed disorder , the treatment options and the  risks for general health and wellness arising from not treating the condition.   I spent more than 45 minutes of face to face time with the patient.  Greater than 50% of time was spent in counseling and coordination of care. We have discussed the diagnosis and differential and I answered the patient's questions.    Plan:  Treatment plan and additional workup :  Trazodone ,  Tennisball methode, MOCA next time.  RV prn, 6 month  Epworth and Spofford, MD 08/25/9474, 54:65 AM  Certified in Neurology by ABPN Certified in [place] by [facility] [address] [place], [place] [address]

## 2017-11-04 ENCOUNTER — Ambulatory Visit: Payer: Medicare Other | Admitting: Neurology

## 2017-11-18 ENCOUNTER — Encounter (INDEPENDENT_AMBULATORY_CARE_PROVIDER_SITE_OTHER): Payer: Medicare Other | Admitting: Ophthalmology

## 2017-11-18 DIAGNOSIS — H33303 Unspecified retinal break, bilateral: Secondary | ICD-10-CM

## 2017-11-18 DIAGNOSIS — H43813 Vitreous degeneration, bilateral: Secondary | ICD-10-CM

## 2017-12-31 DIAGNOSIS — Z23 Encounter for immunization: Secondary | ICD-10-CM | POA: Diagnosis not present

## 2018-03-02 DIAGNOSIS — R609 Edema, unspecified: Secondary | ICD-10-CM | POA: Diagnosis not present

## 2018-03-04 DIAGNOSIS — Z85820 Personal history of malignant melanoma of skin: Secondary | ICD-10-CM | POA: Diagnosis not present

## 2018-03-04 DIAGNOSIS — D2361 Other benign neoplasm of skin of right upper limb, including shoulder: Secondary | ICD-10-CM | POA: Diagnosis not present

## 2018-03-04 DIAGNOSIS — D225 Melanocytic nevi of trunk: Secondary | ICD-10-CM | POA: Diagnosis not present

## 2018-03-04 DIAGNOSIS — D1801 Hemangioma of skin and subcutaneous tissue: Secondary | ICD-10-CM | POA: Diagnosis not present

## 2018-03-04 DIAGNOSIS — D2371 Other benign neoplasm of skin of right lower limb, including hip: Secondary | ICD-10-CM | POA: Diagnosis not present

## 2018-03-04 DIAGNOSIS — L814 Other melanin hyperpigmentation: Secondary | ICD-10-CM | POA: Diagnosis not present

## 2018-03-20 DIAGNOSIS — R3912 Poor urinary stream: Secondary | ICD-10-CM | POA: Diagnosis not present

## 2018-03-20 DIAGNOSIS — N401 Enlarged prostate with lower urinary tract symptoms: Secondary | ICD-10-CM | POA: Diagnosis not present

## 2018-03-24 ENCOUNTER — Other Ambulatory Visit: Payer: Self-pay | Admitting: Internal Medicine

## 2018-03-24 DIAGNOSIS — K118 Other diseases of salivary glands: Secondary | ICD-10-CM

## 2018-03-30 ENCOUNTER — Ambulatory Visit
Admission: RE | Admit: 2018-03-30 | Discharge: 2018-03-30 | Disposition: A | Discharge status: Home / Self Care | Payer: Medicare Other | Source: Ambulatory Visit | Attending: Internal Medicine | Admitting: Internal Medicine

## 2018-03-30 DIAGNOSIS — K118 Other diseases of salivary glands: Secondary | ICD-10-CM

## 2018-03-30 DIAGNOSIS — R221 Localized swelling, mass and lump, neck: Secondary | ICD-10-CM | POA: Diagnosis not present

## 2018-04-08 DIAGNOSIS — G629 Polyneuropathy, unspecified: Secondary | ICD-10-CM | POA: Diagnosis not present

## 2018-04-08 DIAGNOSIS — R599 Enlarged lymph nodes, unspecified: Secondary | ICD-10-CM | POA: Diagnosis not present

## 2018-04-08 DIAGNOSIS — F418 Other specified anxiety disorders: Secondary | ICD-10-CM | POA: Diagnosis not present

## 2018-04-09 ENCOUNTER — Telehealth: Payer: Self-pay | Admitting: *Deleted

## 2018-04-09 NOTE — Telephone Encounter (Signed)
Received voice mail from [place], MDs, Edmonia Lynch [phone]: has a referral ben received for this patient? Nothing received at Dr. Grier Mitts desk. Attempted to contact Jeannie: left voice mail giving Wilhelmina Mcardle contact info/fax # for new patient referrals.

## 2018-04-10 DIAGNOSIS — R590 Localized enlarged lymph nodes: Secondary | ICD-10-CM | POA: Diagnosis not present

## 2018-04-10 DIAGNOSIS — R591 Generalized enlarged lymph nodes: Secondary | ICD-10-CM | POA: Diagnosis not present

## 2018-04-10 DIAGNOSIS — D479 Neoplasm of uncertain behavior of lymphoid, hematopoietic and related tissue, unspecified: Secondary | ICD-10-CM | POA: Diagnosis not present

## 2018-04-22 ENCOUNTER — Other Ambulatory Visit: Payer: Self-pay | Admitting: Otolaryngology

## 2018-04-24 ENCOUNTER — Other Ambulatory Visit: Payer: Self-pay

## 2018-04-24 ENCOUNTER — Encounter (HOSPITAL_COMMUNITY): Payer: Self-pay | Admitting: *Deleted

## 2018-04-24 MED ORDER — GENTAMICIN SULFATE 40 MG/ML IJ SOLN
490.0000 mg | INTRAVENOUS | Status: DC
  Filled 2018-04-24 (×2): qty 12.25

## 2018-04-24 MED ORDER — CLINDAMYCIN PHOSPHATE 900 MG/50ML IV SOLN: 900.0000 mg | INTRAVENOUS | Status: DC

## 2018-04-24 MED ORDER — GENTAMICIN SULFATE 40 MG/ML IJ SOLN
5.0000 mg/kg | INTRAVENOUS | Status: DC
  Filled 2018-04-24: qty 12.5

## 2018-04-24 MED ORDER — CLINDAMYCIN PHOSPHATE 900 MG/50ML IV SOLN
900.0000 mg | INTRAVENOUS | Status: AC
  Administered 2018-04-27: 900 mg via INTRAVENOUS
  Filled 2018-04-24: qty 50

## 2018-04-24 NOTE — Anesthesia Preprocedure Evaluation (Addendum)
Anesthesia Evaluation  Patient identified by MRN, date of birth, ID band Patient awake    Reviewed: Allergy & Precautions, NPO status , Patient's Chart, lab work & pertinent test results  History of Anesthesia Complications Negative for: history of anesthetic complications  Airway Mallampati: II  TM Distance: >3 FB Neck ROM: Full    Dental no notable dental hx. (+) Dental Advisory Given   Pulmonary    Pulmonary exam normal        Cardiovascular hypertension, Pt. on home beta blockers and Pt. on medications + Peripheral Vascular Disease  Normal cardiovascular exam  Cardiac MRI stress morphology and function 11/24/13 ([place]): 1. The left ventricle is upper limit of normal in end-diastolic volume, and mild enlarged in end-systolic volume, with normal wall thickness. Global systolic function is mildly reduced. The LV ejection fraction is 51%. There are no regional wall motion abnormalities.             LV volumes absolute and indexed to BSA with age- and      gender matched normal values in parenthesis (mean, 95%    CI)  are as follows:             EDV 199 ml (146, 105-187)             ESV 98 ml (47, 25-70)             EDVI 92 ml/m2 (75, 58-93)             ESVI 45 ml/m2 (24, 13-35) 2. The right ventricle is normal in volumes, and wall thickness. Global systolic function is normal, the quantitative RVEF is 57%. There are no regional wall motion abnormalities.             RV volumes absolute and indexed to BSA with age- and     gender matched normal values in parenthesis (mean, 95%    CI) are as follows:             EDV 180 ml (150, 100-200)             ESV 78 ml (46, 16-76)             EDVI 83 ml/m2 (75, 52-98)             ESVI 36 ml/m2 (23, 8-37) 3. Both atria are mild-moderately dilated. 4. The aortic valve is trileaflet, there is no significant aortic valve stenosis or regurgitation. There is mild mitral regurgitation  (s/p mitral ring), and mild tricuspid regurgitation, no other significant valvular disease seen.  5. Delayed enhancement imaging demonstrates no evidence of myocardial infarction, scar or infiltrative disease.  6. Regadenoson stress perfusion imaging demonstrates no evidence of inducible myocardial ischemia.      Neuro/Psych negative neurological ROS  negative psych ROS   GI/Hepatic negative GI ROS, Neg liver ROS,   Endo/Other  negative endocrine ROS  Renal/GU negative Renal ROS  negative genitourinary   Musculoskeletal negative musculoskeletal ROS (+)   Abdominal   Peds negative pediatric ROS (+)  Hematology negative hematology ROS (+)   Anesthesia Other Findings   Reproductive/Obstetrics negative OB ROS                            Anesthesia Physical Anesthesia Plan  ASA: III  Anesthesia Plan: General   Post-op Pain Management:    Induction: Intravenous  PONV Risk Score and Plan: 3 and Ondansetron, Dexamethasone and  Scopolamine patch - Pre-op  Airway Management Planned: LMA  Additional Equipment:   Intra-op Plan:   Post-operative Plan: Extubation in OR  Informed Consent: I have reviewed the patients History and Physical, chart, labs and discussed the procedure including the risks, benefits and alternatives for the proposed anesthesia with the patient or authorized representative who has indicated his/her understanding and acceptance.     Dental advisory given  Plan Discussed with: CRNA and Anesthesiologist  Anesthesia Plan Comments: (PAT note written 04/24/2018 by Myra Gianotti, PA-C. )      Anesthesia Quick Evaluation

## 2018-04-24 NOTE — Progress Notes (Signed)
Anesthesia Chart Review: Sean Stanley   Case:  [id number] Date/Time:  04/27/18 0910   Procedure:  LYMPH NODE EXCISIONAL BIOPSY OF CERVICAL LYMPH NODE (Left )   Anesthesia type:  General   Pre-op diagnosis:  Cervical lymphadenopathy   Location:  [facility] OR ROOM 10 / [place] OR   Surgeon:  Melida Quitter, MD      DISCUSSION: Patient is a 78 year old male scheduled for the above procedure. He presented to ENT with left neck mass. FNA was non-diagnostic for above procedure recommended. Surgery planned in the hospital due to cardiac and aneurysm history.  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  Based on cardiology notes in [facility], Dr. Edwin Dada classified patient as "low risk" for planned surgery.  He was last seen by vascular surgery on 06/13/17 with stable CTA. Continued surveillance in one year recommended.  He is a same day work-up, so further evaluation and review of same day labs by his anesthesia team at that time, but based on currently available information I would anticipate that he can proceed as planned. (Of note I have contacted vascular surgeon Dr. Molly Maduro office regarding 03/30/18 CT results showing 4.5 ascending TAA with one year follow-up recommended, as I wanted to confirm who would be following  this in the future.)   PROVIDERS: [name], MD is PCP. Cloretta Ned, MD is cardiologist ([place]). Last visit 06/12/17. Maura Crandall, MD is vascular surgeon ([place]). Last visit 06/13/17 with Kathlene Cote, PA with one year follow-up recommended with repeat CT scan at that time.   LABS: He will need updated labs prior to procedure. As of 06/12/17 Cr 1.2 and glucose 103 in Care Everywhere.    IMAGES: CT soft tissue neck wo contrast 03/30/18: IMPRESSION: 1. Extensive adenopathy on the left. The largest node is a level 1/submandibular node measuring 38 x 24 x 22 mm. Largest level 2 node measures 3.2 x 2 x 2 cm. Numerous other smaller but round lymph nodes throughout the left neck in the level 2 through level 4 region. The largest level 5 node measures 3.4 x 3.4 x 2.3 cm with a transverse diameter of 2.3 cm. This contains some internal calcifications. There are numerous other pathologic nodes in the left supraclavicular to axillary region. This pattern of disease could be due to extensive left neck metastatic disease from unknown primary, lymphoma, or other systemic malignancy. 2. No evidence of mucosal or submucosal lesion. 3. Diameter of the ascending aorta is 4.5 cm. Recommend annual imaging followup by CTA or MRA. This recommendation follows 2010 ACCF/AHA/AATS/ACR/ASA/SCA/SCAI/SIR/STS/SVM Guidelines for the Diagnosis and Management of Patients with Thoracic Aortic Disease. 2010; 121: E952-W413. 4. In the future, contrast administration is recommended for evaluating neck masses by CT.  CTA 06/13/2017 ([place]): Impression: 1. 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 2. 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 3. Slight reduction in size of right retroperitoneal  evolving hematoma (4.3 x 2.5 cm). 4. No aneurysm, dissection, intramural hematoma of abdominal aorta.   EKG: 06/12/17 Terald Sleeper): Result Narrative: TRACING REQUESTED. Sinus rhythm with occasional premature ventricular complexes Left ventricular hypertrophy Abnormal ECG When compared with ECG of 18-Jul-2015 11:31, No significant change was found I reviewed and concur with this report. Electronically signed TO:[email or web address], MD, CAMILLE ([id number]) on 06/12/2017 4:12:55 PM   CV: Cardiac MRI stress morphology and function 11/24/13 ([place]): 1. The left ventricle is upper limit of normal in end-diastolic volume, and mild enlarged in end-systolic volume, with normal wall thickness. Global systolic function is mildly reduced. The LV ejection fraction is 51%. There are no regional wall motion abnormalities.  LV volumes absolute and indexed to BSA with age- and  gender matched normal values in parenthesis (mean, 95%  CI)  are as follows:  EDV 199 ml (146, 105-187)  ESV 98 ml (47, 25-70)  EDVI 92 ml/m2 (75, 58-93)  ESVI 45 ml/m2 (24, 13-35) 2. The right ventricle is normal in volumes, and wall thickness. Global systolic function is normal, the quantitative RVEF is 57%. There are no regional wall motion abnormalities.  RV volumes absolute and indexed to BSA with age- and  gender matched normal values in parenthesis (mean, 95%  CI) are as follows:  EDV 180 ml (150, 100-200)  ESV 78 ml (46, 16-76)  EDVI 83 ml/m2 (75, 52-98)  ESVI 36 ml/m2 (23, 8-37) 3. Both atria are mild-moderately dilated. 4. The aortic valve is trileaflet, there is no significant aortic valve stenosis or regurgitation. There is mild mitral regurgitation (s/p mitral ring), and mild tricuspid regurgitation, no other significant valvular disease seen.  5. Delayed enhancement imaging demonstrates no evidence of myocardial infarction, scar or infiltrative disease.  6. Regadenoson stress perfusion imaging demonstrates no evidence of  inducible myocardial ischemia.    TEE 06/22/13: Study Conclusions - Left ventricle: Systolic function was mildly to moderately reduced. The estimated ejection fraction was in the range of 40% to 45%. Wall motion was normal; there were no regional wall motion abnormalities. - Aortic valve: Trivial regurgitation. - Mitral valve: Mild regurgitation. - Left atrium: The atrium was dilated. No evidence of thrombus in the atrial cavity or appendage. No evidence of thrombus in the atrial cavity or appendage. No evidence of thrombus in the appendage. - Right atrium: No evidence of thrombus in the atrial cavity or appendage. - Atrial septum: No defect or patent foramen ovale was identified. Impressions: - No cardiac source of emboli was indentified. (Comparison EF 02/12/12 45-50%.)  Cardiac cath 06/19/04: FINAL RESULTS: 1.  Angiographically patent coronary arteries. No coronary disease noted.  2.  False positive Cardiolite stress test.  3.  LVEF 50% estimated.  4.  Normal right heart hemodynamics.  5.  Normal cardiac output determinations.   Past Medical History:  Diagnosis Date  . 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 Atrial flutter ([place])   . BPH (benign prostatic  hyperplasia)   . Cancer ([place])    melanoma on lower right back and left chest - surgically removed and cleared  . 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 Headache   . 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 Hypercholesteremia   . Hypercholesterolemia   . Mitral valve disease    annuloplasty 2002 [facility]  .  Neuropathy   . Neuropathy of both feet    pt states due to exposure to [employer]  . OSA (obstructive sleep apnea)    does not use cpap, Dr. Maxwell Caul told him he had improved  . Pneumonia   . Thoracic ascending aortic aneurysm (HCC)    4.5 cm 03/2018 CT    Past Surgical History:  Procedure Laterality Date  . ABDOMINAL ANGIOGRAM  08/05/12  . aneurysm repair    . CARDIOVERSION  02/12/2012   Procedure: CARDIOVERSION;  Surgeon: Pixie Casino, MD;  Location: [facility] ENDOSCOPY;  Service: Cardiovascular;  Laterality: N/A;  . CARDIOVERSION N/A 06/22/2013   Procedure: CARDIOVERSION;  Surgeon: [name], MD;  Location: [place];  Service: Cardiovascular;  Laterality: N/A;  . CATARACT EXTRACTION Bilateral 2018   with lens implant  . CHOLECYSTECTOMY    . COLONOSCOPY    . MENISCUS REPAIR Right 2009  . MITRAL VALVE REPAIR  2002   [facility]  . NM MYOVIEW LTD  07/22/2006   no ischemia  . RIGHT HEART CATH  06/19/2004   normal right heart dynamics. EF 50%  . TEE WITHOUT CARDIOVERSION  02/12/2012   Procedure: TRANSESOPHAGEAL ECHOCARDIOGRAM (TEE);  Surgeon: Pixie Casino, MD;  Location: [facility] ENDOSCOPY;  Service: Cardiovascular;  Laterality: N/A;  . TEE WITHOUT CARDIOVERSION N/A 06/22/2013   Procedure: TRANSESOPHAGEAL ECHOCARDIOGRAM (TEE);  Surgeon: [name], MD;  Location: [facility] ENDOSCOPY;  Service: Cardiovascular;  Laterality: N/A;    MEDICATIONS: No current facility-administered medications for this encounter.    Marland Kitchen acetaminophen (TYLENOL) 500 MG tablet  . alfuzosin (UROXATRAL) 10 MG 24 hr tablet  . candesartan (ATACAND) 4 MG tablet  . Cholecalciferol (VITAMIN D) 2000 units tablet  . ezetimibe (ZETIA) 10 MG tablet  . finasteride (PROSCAR) 5 MG tablet  . metoprolol succinate (TOPROL-XL) 25 MG 24 hr tablet  . OVER THE COUNTER MEDICATION  . Polyethyl Glycol-Propyl Glycol (SYSTANE OP)    Myra Gianotti, PA-C Surgical Short Stay/Anesthesiology [facility] Phone [phone] [facility] Phone  [phone] 04/24/2018 3:57 PM

## 2018-04-24 NOTE — Progress Notes (Signed)
Spoke with pt for pre-op call. Pt has hx of A-flutter, Mitral valve disease with mitral valve repair, 5 aneurysms (1 bled out and closed itself off, 2 were coiled at [facility] in 2014 and he still has 1 small one in his kidney and 1 in the celiac artery and is followed at [facility]). His cardiologist is Dr. Edwin Dada at [facility]. Pt denies any recent chest pain or sob. Pt is not diabetic.

## 2018-04-25 DIAGNOSIS — C8590 Non-Hodgkin lymphoma, unspecified, unspecified site: Secondary | ICD-10-CM

## 2018-04-25 HISTORY — DX: Non-Hodgkin lymphoma, unspecified, unspecified site: C85.90

## 2018-04-27 ENCOUNTER — Ambulatory Visit (HOSPITAL_COMMUNITY): Payer: Medicare Other | Admitting: Vascular Surgery

## 2018-04-27 ENCOUNTER — Encounter (HOSPITAL_COMMUNITY): Payer: Self-pay | Admitting: Certified Registered Nurse Anesthetist

## 2018-04-27 ENCOUNTER — Other Ambulatory Visit: Payer: Self-pay

## 2018-04-27 ENCOUNTER — Ambulatory Visit (HOSPITAL_COMMUNITY)
Admission: RE | Admit: 2018-04-27 | Discharge: 2018-04-27 | Disposition: A | Discharge status: Home / Self Care | Payer: Medicare Other | Attending: Otolaryngology | Admitting: Otolaryngology

## 2018-04-27 ENCOUNTER — Encounter (HOSPITAL_COMMUNITY)
Admission: RE | Disposition: A | Discharge status: Home / Self Care | Payer: Self-pay | Source: Home / Self Care | Attending: Otolaryngology

## 2018-04-27 DIAGNOSIS — I739 Peripheral vascular disease, unspecified: Secondary | ICD-10-CM | POA: Diagnosis not present

## 2018-04-27 DIAGNOSIS — I059 Rheumatic mitral valve disease, unspecified: Secondary | ICD-10-CM | POA: Insufficient documentation

## 2018-04-27 DIAGNOSIS — I10 Essential (primary) hypertension: Secondary | ICD-10-CM | POA: Diagnosis not present

## 2018-04-27 DIAGNOSIS — Z85820 Personal history of malignant melanoma of skin: Secondary | ICD-10-CM | POA: Diagnosis not present

## 2018-04-27 DIAGNOSIS — G4733 Obstructive sleep apnea (adult) (pediatric): Secondary | ICD-10-CM | POA: Insufficient documentation

## 2018-04-27 DIAGNOSIS — N400 Enlarged prostate without lower urinary tract symptoms: Secondary | ICD-10-CM | POA: Insufficient documentation

## 2018-04-27 DIAGNOSIS — Z881 Allergy status to other antibiotic agents status: Secondary | ICD-10-CM | POA: Insufficient documentation

## 2018-04-27 DIAGNOSIS — Z79899 Other long term (current) drug therapy: Secondary | ICD-10-CM | POA: Insufficient documentation

## 2018-04-27 DIAGNOSIS — C8311 Mantle cell lymphoma, lymph nodes of head, face, and neck: Secondary | ICD-10-CM | POA: Diagnosis not present

## 2018-04-27 DIAGNOSIS — R590 Localized enlarged lymph nodes: Secondary | ICD-10-CM | POA: Diagnosis not present

## 2018-04-27 DIAGNOSIS — I4892 Unspecified atrial flutter: Secondary | ICD-10-CM | POA: Diagnosis not present

## 2018-04-27 DIAGNOSIS — Z886 Allergy status to analgesic agent status: Secondary | ICD-10-CM | POA: Insufficient documentation

## 2018-04-27 DIAGNOSIS — E7800 Pure hypercholesterolemia, unspecified: Secondary | ICD-10-CM | POA: Diagnosis not present

## 2018-04-27 DIAGNOSIS — G629 Polyneuropathy, unspecified: Secondary | ICD-10-CM | POA: Diagnosis not present

## 2018-04-27 DIAGNOSIS — R591 Generalized enlarged lymph nodes: Secondary | ICD-10-CM | POA: Diagnosis not present

## 2018-04-27 HISTORY — DX: Abdominal aortic aneurysm, ruptured: I71.3

## 2018-04-27 HISTORY — DX: Pneumonia, unspecified organism: J18.9

## 2018-04-27 HISTORY — DX: Aneurysm of the ascending aorta, without rupture: I71.21

## 2018-04-27 HISTORY — DX: Thoracic aortic aneurysm, without rupture: I71.2

## 2018-04-27 HISTORY — DX: Unspecified mononeuropathy of bilateral lower limbs: G57.93

## 2018-04-27 HISTORY — DX: Contact with and (suspected) exposure to other war theater: Z77.39

## 2018-04-27 HISTORY — DX: Contact with and (suspected) exposure to other hazardous, chiefly nonmedicinal, chemicals: Z77.098

## 2018-04-27 HISTORY — DX: Aneurysm of renal artery: I72.2

## 2018-04-27 HISTORY — PX: LYMPH NODE BIOPSY: SHX201

## 2018-04-27 HISTORY — DX: Unspecified jaundice: R17

## 2018-04-27 HISTORY — DX: Aneurysm of other specified arteries: I72.8

## 2018-04-27 HISTORY — DX: Malignant (primary) neoplasm, unspecified: C80.1

## 2018-04-27 LAB — CBC
HCT: 41.6 % (ref 39.0–52.0)
Hemoglobin: 13.4 g/dL (ref 13.0–17.0)
MCH: 29.3 pg (ref 26.0–34.0)
MCHC: 32.2 g/dL (ref 30.0–36.0)
MCV: 91 fL (ref 80.0–100.0)
NRBC: 0 % (ref 0.0–0.2)
Platelets: 158 10*3/uL (ref 150–400)
RBC: 4.57 MIL/uL (ref 4.22–5.81)
RDW: 12.9 % (ref 11.5–15.5)
WBC: 4.9 10*3/uL (ref 4.0–10.5)

## 2018-04-27 LAB — BASIC METABOLIC PANEL
ANION GAP: 10 (ref 5–15)
BUN: 26 mg/dL — ABNORMAL HIGH (ref 8–23)
CO2: 22 mmol/L (ref 22–32)
Calcium: 8.8 mg/dL — ABNORMAL LOW (ref 8.9–10.3)
Chloride: 109 mmol/L (ref 98–111)
Creatinine, Ser: 1.15 mg/dL (ref 0.61–1.24)
GFR calc Af Amer: >60 mL/min (ref >60)
Glucose, Bld: 98 mg/dL (ref 70–99)
Potassium: 4 mmol/L (ref 3.5–5.1)
Sodium: 141 mmol/L (ref 135–145)

## 2018-04-27 SURGERY — LYMPH NODE BIOPSY
Anesthesia: General | Laterality: Left

## 2018-04-27 MED ORDER — PHENYLEPHRINE 40 MCG/ML (10ML) SYRINGE FOR IV PUSH (FOR BLOOD PRESSURE SUPPORT)
PREFILLED_SYRINGE | INTRAVENOUS | Status: AC
  Filled 2018-04-27: qty 10

## 2018-04-27 MED ORDER — PROPOFOL 10 MG/ML IV BOLUS
INTRAVENOUS | Status: DC | PRN
  Administered 2018-04-27: 170 mg via INTRAVENOUS

## 2018-04-27 MED ORDER — DEXAMETHASONE SODIUM PHOSPHATE 10 MG/ML IJ SOLN
INTRAMUSCULAR | Status: AC
  Filled 2018-04-27: qty 1

## 2018-04-27 MED ORDER — FENTANYL CITRATE (PF) 100 MCG/2ML IJ SOLN: 25.0000 ug | INTRAMUSCULAR | Status: DC | PRN

## 2018-04-27 MED ORDER — LIDOCAINE-EPINEPHRINE 1 %-1:100000 IJ SOLN
INTRAMUSCULAR | Status: DC | PRN
  Administered 2018-04-27: 1 mL

## 2018-04-27 MED ORDER — SUCCINYLCHOLINE CHLORIDE 200 MG/10ML IV SOSY
PREFILLED_SYRINGE | INTRAVENOUS | Status: AC
  Filled 2018-04-27: qty 10

## 2018-04-27 MED ORDER — ACETAMINOPHEN 500 MG PO TABS
1000.0000 mg | ORAL_TABLET | Freq: Once | ORAL | Status: AC
  Administered 2018-04-27: 1000 mg via ORAL
  Filled 2018-04-27: qty 2

## 2018-04-27 MED ORDER — DEXAMETHASONE SODIUM PHOSPHATE 10 MG/ML IJ SOLN
INTRAMUSCULAR | Status: DC | PRN
  Administered 2018-04-27: 10 mg via INTRAVENOUS

## 2018-04-27 MED ORDER — SCOPOLAMINE 1 MG/3DAYS TD PT72
1.0000 | MEDICATED_PATCH | TRANSDERMAL | Status: DC
  Administered 2018-04-27: 1.5 mg via TRANSDERMAL
  Filled 2018-04-27: qty 1

## 2018-04-27 MED ORDER — ONDANSETRON HCL 4 MG/2ML IJ SOLN
INTRAMUSCULAR | Status: DC | PRN
  Administered 2018-04-27: 4 mg via INTRAVENOUS

## 2018-04-27 MED ORDER — PROMETHAZINE HCL 25 MG/ML IJ SOLN: 6.2500 mg | INTRAMUSCULAR | Status: DC | PRN

## 2018-04-27 MED ORDER — LIDOCAINE 2% (20 MG/ML) 5 ML SYRINGE
INTRAMUSCULAR | Status: AC
  Filled 2018-04-27: qty 5

## 2018-04-27 MED ORDER — PROPOFOL 10 MG/ML IV BOLUS
INTRAVENOUS | Status: AC
  Filled 2018-04-27: qty 40

## 2018-04-27 MED ORDER — FENTANYL CITRATE (PF) 250 MCG/5ML IJ SOLN
INTRAMUSCULAR | Status: AC
  Filled 2018-04-27: qty 5

## 2018-04-27 MED ORDER — EPHEDRINE 5 MG/ML INJ
INTRAVENOUS | Status: AC
  Filled 2018-04-27: qty 10

## 2018-04-27 MED ORDER — BACITRACIN ZINC 500 UNIT/GM EX OINT
TOPICAL_OINTMENT | CUTANEOUS | Status: AC
  Filled 2018-04-27: qty 28.35

## 2018-04-27 MED ORDER — LIDOCAINE 2% (20 MG/ML) 5 ML SYRINGE
INTRAMUSCULAR | Status: DC | PRN
  Administered 2018-04-27: 80 mg via INTRAVENOUS

## 2018-04-27 MED ORDER — PHENYLEPHRINE 40 MCG/ML (10ML) SYRINGE FOR IV PUSH (FOR BLOOD PRESSURE SUPPORT)
PREFILLED_SYRINGE | INTRAVENOUS | Status: DC | PRN
  Administered 2018-04-27 (×5): 80 ug via INTRAVENOUS

## 2018-04-27 MED ORDER — LACTATED RINGERS IV SOLN
INTRAVENOUS | Status: DC
  Administered 2018-04-27: 08:00:00 via INTRAVENOUS

## 2018-04-27 MED ORDER — SUCCINYLCHOLINE CHLORIDE 200 MG/10ML IV SOSY
PREFILLED_SYRINGE | INTRAVENOUS | Status: DC | PRN
  Administered 2018-04-27: 120 mg via INTRAVENOUS

## 2018-04-27 MED ORDER — EPHEDRINE SULFATE-NACL 50-0.9 MG/10ML-% IV SOSY
PREFILLED_SYRINGE | INTRAVENOUS | Status: DC | PRN
  Administered 2018-04-27 (×4): 10 mg via INTRAVENOUS

## 2018-04-27 MED ORDER — FENTANYL CITRATE (PF) 100 MCG/2ML IJ SOLN
INTRAMUSCULAR | Status: DC | PRN
  Administered 2018-04-27 (×2): 50 ug via INTRAVENOUS

## 2018-04-27 MED ORDER — MIDAZOLAM HCL 2 MG/2ML IJ SOLN
INTRAMUSCULAR | Status: AC
  Filled 2018-04-27: qty 2

## 2018-04-27 MED ORDER — ONDANSETRON HCL 4 MG/2ML IJ SOLN
INTRAMUSCULAR | Status: AC
  Filled 2018-04-27: qty 2

## 2018-04-27 SURGICAL SUPPLY — 46 items
ADH SKN CLS APL DERMABOND .7 (GAUZE/BANDAGES/DRESSINGS) ×1
BLADE SURG 15 STRL LF DISP TIS (BLADE) IMPLANT
BLADE SURG 15 STRL SS (BLADE)
CANISTER SUCT 3000ML PPV (MISCELLANEOUS) IMPLANT
CLEANER TIP ELECTROSURG 2X2 (MISCELLANEOUS) ×2 IMPLANT
CONT SPEC 4OZ CLIKSEAL STRL BL (MISCELLANEOUS) ×2 IMPLANT
CORD BIPOLAR FORCEPS 12FT (ELECTRODE) ×1 IMPLANT
COVER SURGICAL LIGHT HANDLE (MISCELLANEOUS) ×2 IMPLANT
COVER WAND RF STERILE (DRAPES) ×2 IMPLANT
CRADLE DONUT ADULT HEAD (MISCELLANEOUS) IMPLANT
DERMABOND ADVANCED (GAUZE/BANDAGES/DRESSINGS) ×1
DERMABOND ADVANCED .7 DNX12 (GAUZE/BANDAGES/DRESSINGS) IMPLANT
DRAIN PENROSE 1/4X12 LTX STRL (WOUND CARE) IMPLANT
DRAPE HALF SHEET 40X57 (DRAPES) IMPLANT
DRSG EMULSION OIL 3X3 NADH (GAUZE/BANDAGES/DRESSINGS) IMPLANT
ELECT COATED BLADE 2.86 ST (ELECTRODE) ×2 IMPLANT
ELECT NDL TIP 2.8 STRL (NEEDLE) IMPLANT
ELECT NEEDLE TIP 2.8 STRL (NEEDLE) IMPLANT
ELECT REM PT RETURN 9FT ADLT (ELECTROSURGICAL) ×2
ELECTRODE REM PT RTRN 9FT ADLT (ELECTROSURGICAL) ×1 IMPLANT
FORCEPS BIPOLAR SPETZLER 8 1.0 (NEUROSURGERY SUPPLIES) ×1 IMPLANT
GAUZE SPONGE 4X4 12PLY STRL (GAUZE/BANDAGES/DRESSINGS) IMPLANT
GLOVE BIO SURGEON STRL SZ7.5 (GLOVE) ×2 IMPLANT
GOWN STRL REUS W/ TWL LRG LVL3 (GOWN DISPOSABLE) ×2 IMPLANT
GOWN STRL REUS W/TWL LRG LVL3 (GOWN DISPOSABLE) ×4
KIT BASIN OR (CUSTOM PROCEDURE TRAY) ×2 IMPLANT
KIT TURNOVER KIT B (KITS) IMPLANT
NDL HYPO 25GX1X1/2 BEV (NEEDLE) IMPLANT
NEEDLE HYPO 25GX1X1/2 BEV (NEEDLE) IMPLANT
NS IRRIG 1000ML POUR BTL (IV SOLUTION) ×2 IMPLANT
PAD ARMBOARD 7.5X6 YLW CONV (MISCELLANEOUS) ×4 IMPLANT
PENCIL BUTTON HOLSTER BLD 10FT (ELECTRODE) ×2 IMPLANT
SUT CHROMIC 4 0 P 3 18 (SUTURE) IMPLANT
SUT ETHILON 4 0 PS 2 18 (SUTURE) IMPLANT
SUT ETHILON 5 0 P 3 18 (SUTURE)
SUT NYLON ETHILON 5-0 P-3 1X18 (SUTURE) IMPLANT
SUT SILK 4 0 (SUTURE)
SUT SILK 4-0 18XBRD TIE 12 (SUTURE) IMPLANT
SUT VIC AB 3-0 SH 27 (SUTURE) ×2
SUT VIC AB 3-0 SH 27X BRD (SUTURE) IMPLANT
SUT VIC AB 4-0 PS2 18 (SUTURE) ×1 IMPLANT
SWAB COLLECTION DEVICE MRSA (MISCELLANEOUS) IMPLANT
SWAB CULTURE ESWAB REG 1ML (MISCELLANEOUS) IMPLANT
SYR BULB IRRIGATION 50ML (SYRINGE) IMPLANT
SYR TB 1ML LUER SLIP (SYRINGE) IMPLANT
TRAY ENT MC OR (CUSTOM PROCEDURE TRAY) ×2 IMPLANT

## 2018-04-27 NOTE — Anesthesia Postprocedure Evaluation (Signed)
Anesthesia Post Note  Patient: Sean Stanley  Procedure(s) Performed: EXCISIONAL BIOPSY OF LEFT CERVICAL LYMPH NODE (Left )     Patient location during evaluation: PACU Anesthesia Type: General Level of consciousness: sedated Pain management: pain level controlled Vital Signs Assessment: post-procedure vital signs reviewed and stable Respiratory status: spontaneous breathing and respiratory function stable Cardiovascular status: stable Postop Assessment: no apparent nausea or vomiting Anesthetic complications: no    Last Vitals:  Vitals:   04/27/18 1040 04/27/18 1055  BP: 122/75 128/79  Pulse: (!) 59 60  Resp: 17 12  Temp:  (!) 36.3 C  SpO2: 97% 98%    Last Pain:  Vitals:   04/27/18 1055  PainSc: 0-No pain                 Fleetwood Pierron DANIEL

## 2018-04-27 NOTE — H&P (Signed)
Sean Stanley is an 78 y.o. male.   Chief Complaint: Left cervical lymphadenopathy HPI: 78 year old male with a couple of months of enlarged nodes in the left neck.  FNA demonstrated abnormal lymphocytes.  He presents for excisional biopsy.  Past Medical History:  Diagnosis Date  . 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 Atrial flutter ([place])   . BPH (benign prostatic hyperplasia)   . Cancer ([place])    melanoma on lower right back and left chest - surgically removed and cleared  . 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 Headache   . 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 Hypercholesteremia   . Hypercholesterolemia   . Mitral valve disease    annuloplasty 2002 [facility]  . Neuropathy   . Neuropathy of both feet    pt states due to exposure to [employer]  . OSA (obstructive sleep apnea)    does not use cpap, Dr. Maxwell Caul told him he had improved  . Pneumonia   . Thoracic ascending aortic aneurysm (HCC)    4.5 cm 03/2018 CT    Past Surgical History:  Procedure Laterality Date  . ABDOMINAL ANGIOGRAM  08/05/12  . aneurysm repair    . CARDIOVERSION  02/12/2012   Procedure: CARDIOVERSION;  Surgeon: Pixie Casino, MD;  Location: [facility] ENDOSCOPY;  Service: Cardiovascular;  Laterality: N/A;  . CARDIOVERSION N/A 06/22/2013   Procedure: CARDIOVERSION;  Surgeon: [name], MD;  Location: [place];  Service: Cardiovascular;  Laterality: N/A;  . CATARACT EXTRACTION Bilateral 2018   with lens implant  . CHOLECYSTECTOMY    . COLONOSCOPY    . MENISCUS REPAIR Right 2009  . MITRAL VALVE REPAIR   2002   [facility]  . NM MYOVIEW LTD  07/22/2006   no ischemia  . RIGHT HEART CATH  06/19/2004   normal right heart dynamics. EF 50%  . TEE WITHOUT CARDIOVERSION  02/12/2012   Procedure: TRANSESOPHAGEAL ECHOCARDIOGRAM (TEE);  Surgeon: Pixie Casino, MD;  Location: [facility] ENDOSCOPY;  Service: Cardiovascular;  Laterality: N/A;  . TEE WITHOUT CARDIOVERSION N/A 06/22/2013   Procedure: TRANSESOPHAGEAL ECHOCARDIOGRAM (TEE);  Surgeon: [name], MD;  Location: [facility] ENDOSCOPY;  Service: Cardiovascular;  Laterality: N/A;    Family History  Problem Relation Age of Onset  . Parkinson's disease Mother 21  . Heart failure Father 48  . Cancer Maternal Grandmother   . Heart attack Maternal Grandfather   . Heart attack Paternal Grandmother   . Heart attack Paternal Grandfather    Social History:  reports that he has never smoked. He has never used smokeless tobacco. He reports current alcohol use. He reports that he does not use drugs.  Allergies:  Allergies  Allergen Reactions  . Aspirin     Has been instructed not to take any blood thinners even aspirin due to history of several aneurysms   . Levaquin [Levofloxacin In D5w]     tendonitis  . Nickel Itching  . Statins Other (See Comments)    Muscle aches  . Ampicillin Rash  Did it involve swelling of the face/tongue/throat, SOB, or low BP? No Did it involve sudden or severe rash/hives, skin peeling, or any reaction on the inside of your mouth or nose? Yes Did you need to seek medical attention at a hospital or doctor's office? Yes When did it last happen?50+ years If all above answers are "NO", may proceed with cephalosporin use.     Medications Prior to Admission  Medication Sig Dispense Refill  . acetaminophen (TYLENOL) 500 MG tablet Take 1,000 mg by mouth daily as needed for moderate pain.    Marland Kitchen alfuzosin (UROXATRAL) 10 MG 24 hr tablet Take 10 mg by mouth at bedtime.     . candesartan (ATACAND) 4 MG tablet Take 4 mg by mouth at  bedtime.     . Cholecalciferol (VITAMIN D) 2000 units tablet Take 2,000 Units by mouth at bedtime.     Marland Kitchen ezetimibe (ZETIA) 10 MG tablet Take 10 mg by mouth at bedtime.     . finasteride (PROSCAR) 5 MG tablet Take 5 mg by mouth daily.     . metoprolol succinate (TOPROL-XL) 25 MG 24 hr tablet Take 12.5 mg by mouth daily.     Marland Kitchen OVER THE COUNTER MEDICATION as needed. Uses lotion with CBD in it for joints    . Polyethyl Glycol-Propyl Glycol (SYSTANE OP) Place 1 drop into both eyes 2 (two) times daily.      Results for orders placed or performed during the hospital encounter of 04/27/18 (from the past 48 hour(s))  Basic metabolic panel     Status: Abnormal   Collection Time: 04/27/18  7:35 AM  Result Value Ref Range   Sodium 141 135 - 145 mmol/L   Potassium 4.0 3.5 - 5.1 mmol/L   Chloride 109 98 - 111 mmol/L   CO2 22 22 - 32 mmol/L   Glucose, Bld 98 70 - 99 mg/dL   BUN 26 (H) 8 - 23 mg/dL   Creatinine, Ser 1.15 0.61 - 1.24 mg/dL   Calcium 8.8 (L) 8.9 - 10.3 mg/dL   GFR calc non Af Amer >60 >60 mL/min   GFR calc Af Amer >60 >60 mL/min   Anion gap 10 5 - 15    Comment: Performed at [facility], [place] [address] [place] [address]  CBC     Status: None   Collection Time: 04/27/18  7:35 AM  Result Value Ref Range   WBC 4.9 4.0 - 10.5 K/uL   RBC 4.57 4.22 - 5.81 MIL/uL   Hemoglobin 13.4 13.0 - 17.0 g/dL   HCT 41.6 39.0 - 52.0 %   MCV 91.0 80.0 - 100.0 fL   MCH 29.3 26.0 - 34.0 pg   MCHC 32.2 30.0 - 36.0 g/dL   RDW 12.9 11.5 - 15.5 %   Platelets 158 150 - 400 K/uL   nRBC 0.0 0.0 - 0.2 %    Comment: Performed at [facility], [place] [address], [place], [place] [address]   No results found.  Review of Systems  All other systems reviewed and are negative.   Blood pressure (!) 158/92, pulse 82, temperature 97.6 F (36.4 C), resp. rate 20, weight 97.8 kg, SpO2 99 %. Physical Exam  Constitutional: He is oriented to person, place, and time. He appears well-developed  and well-nourished. No distress.  HENT:  Head: Normocephalic and atraumatic.  Right Ear: External ear normal.  Left Ear: External ear normal.  Nose: Nose normal.  Mouth/Throat: Oropharynx is  clear and moist.  Eyes: Pupils are equal, round, and reactive to light. Conjunctivae and EOM are normal.  Neck: Normal range of motion. Neck supple.  Left neck with multiple enlarged nodes, zones 1 and 2  Cardiovascular: Normal rate.  Respiratory: Effort normal.  Neurological: He is alert and oriented to person, place, and time. No cranial nerve deficit.  Skin: Skin is warm and dry.  Psychiatric: He has a normal mood and affect. His behavior is normal. Judgment and thought content normal.     Assessment/Plan Left cervical lymphadenopathy  To OR for excisional biopsy of left cervical node.  Melida Quitter, MD 04/27/2018, 9:13 AM

## 2018-04-27 NOTE — Op Note (Signed)
NAME: Sean Stanley, Sean Stanley MEDICAL RECORD YB:[id number] ACCOUNT [id number] DATE OF BIRTH:1941-03-18 FACILITY: [facility] LOCATION: [facility]-PER[facility] PHYSICIAN:Lujain Kraszewski D. Rudean Icenhour, MD  OPERATIVE REPORT  DATE OF PROCEDURE:  04/27/2018  PREOPERATIVE DIAGNOSIS:  Left cervical lymphadenopathy.  POSTOPERATIVE DIAGNOSIS:  Left cervical lymphadenopathy.  PROCEDURE:  Excision of deep left cervical lymph node, zone 2.  SURGEON:  Melida Quitter, MD  ANESTHESIA:  General endotracheal anesthesia.  COMPLICATIONS:  None.  INDICATIONS:  The patient is a 78 year old male with a few months of enlargement of lymph nodes in the left neck without other symptoms.  A fine-needle aspiration demonstrated abnormal lymphocytes, and an excisional biopsy was recommended.  He presents  to the operating room for surgical management.  FINDINGS:  There were enlarged nodes in the left zone 1 and 2 regions that were palpable.  The left zone 2 node was removed and sent for pathology for lymphoma protocol.  DESCRIPTION OF PROCEDURE:  The patient was identified in the holding room, informed consent having been obtained including discussion of risks, benefits and alternatives.  The patient was brought to the operative suite and placed on the operative table  in supine position.  Anesthesia was induced, and the patient was intubated by the anesthesia team without difficulty.  The patient was given intravenous antibiotics during the case.  The left neck incision was marked with a marking pen and injected with  1% lidocaine with 1:100,000 of epinephrine.  The neck was prepped and draped in sterile fashion.  An incision was made with a 15-blade scalpel and extended through the subcutaneous and platysmal layer using Bovie electrocautery.  Dissection was then  performed immediately down onto the lymph node, which was then carefully dissected from surrounding tissues until it was able to be removed.  This was passed to nursing for pathology.  Bleeding was  controlled with bipolar electrocautery.  There was no  additional bleeding seen with a Valsalva.  The wound was copiously irrigated with saline.  The platysmal layer was closed with 3-0 Vicryl suture in a simple interrupted fashion.  The subcutaneous layer was closed with 4-0 Vicryl suture in a simple  interrupted fashion.  The skin was closed with Dermabond.  The patient was cleaned off and returned to anesthesia for wakeup and was extubated in the recovery room in stable condition.  LN/NUANCE  D:04/27/2018 T:04/27/2018 JOB:[id number]

## 2018-04-27 NOTE — Brief Op Note (Signed)
04/27/2018  10:13 AM  PATIENT:  Sean Stanley  78 y.o. male  PRE-OPERATIVE DIAGNOSIS:  Cervical lymphadenopathy  POST-OPERATIVE DIAGNOSIS:  Cervical lymphadenopathy  PROCEDURE:  Procedure(s): LYMPH NODE EXCISIONAL BIOPSY OF CERVICAL LYMPH NODE (Left)  SURGEON:  Surgeon(s) and Role:    Melida Quitter, MD - Primary  PHYSICIAN ASSISTANT:   ASSISTANTS: none   ANESTHESIA:   general  EBL: Minimal  BLOOD ADMINISTERED:none  DRAINS: none   LOCAL MEDICATIONS USED:  LIDOCAINE   SPECIMEN:  Source of Specimen:  Left zone 2 lymph node  DISPOSITION OF SPECIMEN:  PATHOLOGY  COUNTS:  YES  TOURNIQUET:  * No tourniquets in log *  DICTATION: .Other Dictation: Dictation Number [id number]  PLAN OF CARE: Discharge to home after PACU  PATIENT DISPOSITION:  PACU - hemodynamically stable.   Delay start of Pharmacological VTE agent (>24hrs) due to surgical blood loss or risk of bleeding: no

## 2018-04-27 NOTE — Anesthesia Procedure Notes (Signed)
Procedure Name: Intubation Date/Time: 04/27/2018 9:31 AM Performed by: Genelle Bal, CRNA Pre-anesthesia Checklist: Patient identified, Emergency Drugs available, Suction available and Patient being monitored Patient Re-evaluated:Patient Re-evaluated prior to induction Oxygen Delivery Method: Circle system utilized Preoxygenation: Pre-oxygenation with 100% oxygen Induction Type: IV induction Ventilation: Mask ventilation without difficulty Laryngoscope Size: Miller and 2 Grade View: Grade I Tube type: Oral Tube size: 7.5 mm Number of attempts: 1 Airway Equipment and Method: Stylet Placement Confirmation: ETT inserted through vocal cords under direct vision,  positive ETCO2 and breath sounds checked- equal and bilateral Secured at: 23 cm Tube secured with: Tape Dental Injury: Teeth and Oropharynx as per pre-operative assessment

## 2018-04-27 NOTE — Transfer of Care (Signed)
Immediate Anesthesia Transfer of Care Note  Patient: Sean Stanley  Procedure(s) Performed: EXCISIONAL BIOPSY OF LEFT CERVICAL LYMPH NODE (Left )  Patient Location: PACU  Anesthesia Type:General  Level of Consciousness: awake, alert  and oriented  Airway & Oxygen Therapy: Patient Spontanous Breathing and Patient connected to face mask oxygen  Post-op Assessment: Report given to RN and Post -op Vital signs reviewed and stable  Post vital signs: Reviewed and stable  Last Vitals:  Vitals Value Taken Time  BP 125/66 04/27/2018 10:25 AM  Temp    Pulse 64 04/27/2018 10:26 AM  Resp 19 04/27/2018 10:26 AM  SpO2 96 % 04/27/2018 10:26 AM  Vitals shown include unvalidated device data.  Last Pain:  Vitals:   04/27/18 0744  PainSc: 0-No pain      Patients Stated Pain Goal: 3 (47/99/80 0123)  Complications: No apparent anesthesia complications

## 2018-04-28 ENCOUNTER — Encounter (HOSPITAL_COMMUNITY): Payer: Self-pay | Admitting: Otolaryngology

## 2018-04-30 ENCOUNTER — Encounter: Payer: Self-pay | Admitting: Neurology

## 2018-04-30 ENCOUNTER — Ambulatory Visit (INDEPENDENT_AMBULATORY_CARE_PROVIDER_SITE_OTHER): Payer: Medicare Other | Admitting: Neurology

## 2018-04-30 VITALS — BP 121/66 | HR 77 | Ht 73.0 in | Wt 220.0 lb

## 2018-04-30 DIAGNOSIS — C8291 Follicular lymphoma, unspecified, lymph nodes of head, face, and neck: Secondary | ICD-10-CM | POA: Diagnosis not present

## 2018-04-30 DIAGNOSIS — G629 Polyneuropathy, unspecified: Secondary | ICD-10-CM | POA: Diagnosis not present

## 2018-04-30 MED ORDER — CLONAZEPAM 0.5 MG PO TABS: 0.2500 mg | ORAL_TABLET | Freq: Every evening | ORAL | 1 refills | Status: DC | PRN

## 2018-04-30 MED ORDER — BUPROPION HCL ER (XL) 150 MG PO TB24: 150.0000 mg | ORAL_TABLET | Freq: Every day | ORAL | 5 refills | Status: DC

## 2018-04-30 NOTE — Progress Notes (Signed)
SLEEP MEDICINE CLINIC   Provider:  Larey Seat, M D  Primary Care Physician:  [name], MD   Referring Provider: [name], MD   Chief Complaint  Patient presents with  . Follow-up    pt with wife, rm 30. pt has a few issues to address about a medication he is taking.   RV 04-30-2018, Sean Stanley is a 78 y.o. male , seen here as in a referral  from [name] and upon recommendation of Sean Stanley for evaluation of neuropathy. Neuropathy has impaired his sleep. He is relieved to hear that his apnea was strictly positional, no hypoxemia and no PLMs. He had nocturia and is followed by Dr Marveen Reeks. He has just heard from his ENT physician that he has Non- Hodkins Lymphoma, a neck lymph node, which was felt and found enlarged by CT. [name]. This is a shock. He had a MOCA today, 26/ 30 points.       HPI:  Sean Stanley is a 77 y.o. male , seen here as in a referral  from [name] and upon recommendation of Sean Stanley for evaluation of neuropathy.  Neuropathy has impaired his sleep. He is relieved to hear that his apnea was strictly positional, no hypoxemia and no PLMs. He had nocturia and is followed by Dr Marveen Reeks.   Today's 28 October 2017, and that he was summarized the patient's baseline polysomnography on 12 September 2017.  The apnea hypopnea index was 18.2, in supine position 75.8 apneas per hour, in nonsupine position 2.7/h.  There was no REM exenteration.  There was no evidence of hypoxemia time spent below 89% oxygen saturation was 0.1-minute.  The patient had only 5 periodic limb movements but many spontaneous arousals that may have been pain or discomfort related, the EKG was not keeping with a sinus rhythm he had some intermittent PACs and PVCs.  Facet the patient would not require CPAP intervention if he could train him to sleep on his side.  We discussed the tennis ball method or other behavior changing methods to help him avoid supine sleep.  I would also  mentioned to say that he will sleep better once he is entrained into the sleep position.   His wife was also interested in the neuropathy work up- he has only had a low Vit D level, no autoimmune evidence. His EMG was interrepted by Dr Jannifer Franklin. No radiculopathy- he has sensory issues, pain and feels numb in both feet.  He likes the foot spa, jet massage . Wearing supportive shoes, treating pain.  Using trazodone for sleep- avoiding anticholinergic agents.  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 Wife asked about hemp topical oils.   I have the pleasure of seeing Sean Stanley as a new patient to mostly practice on 23 Jul 2017.  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  About 25 years ago he began noticing numbness in his toes which from their ascended to the lower extremities.  It has not affected the upper extremities.  He does not have an associated skin rash.  He does not have raynaudes syndrome.  He has noted that when he is physically more active and more on his feet he will have more pain at  night and sometimes also has muscle spasms in the lower extremities especially the gastrocnemius, described as a charley horse. He has been woken by these cramps.  It comes and goes, not lasting long but very, very painful.He is of [place] ancestry, [place].    Chief complaint according to patient : sleep disorder, neuropathy, agent orange exposure.  Sleep habits are as follows: The patient's bedtime is between 10 and 10:30 PM, the bedroom is cool, quiet dark. He sleeps within 15 minutes, but wakes up for 2-3 times due to nocturia. He has increased exercise recently, feels this improved his sleep time. Fragmented sleep, total  Sleep time of 5 hours in a good night. Naps in daytime whenever  he sits still. Wakes not refreshed, excessive daytime sleepiness. Wife stated he snores. -Was placed once on CPAP but quit using it. No family history of insomnia or neuropathy.   Sleep medical history and family sleep history:   Atrial fibrillation,  Dr. Esau Grew. cardioablation after cardioversions.   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  No smoking history, ETOH : drinks hard liquor, now only wine . Caffeine ; 1 cup in PM, decaffeinated- soda and no iced teas.     Review of Systems: Out of a complete 14 system review, the patient complains of only the following symptoms, and all other reviewed systems are negative.   Social History   Socioeconomic History  . Marital status: Married    Spouse name: Not on file  . Number of children: Not on file  . Years of education: Not on file  . Highest education level: Not on file  Occupational History  . Not on file  Social Needs  . Financial resource strain: Not on file  . Food insecurity:    Worry: Not on file    Inability: Not on file  . Transportation needs:    Medical: Not on file    Non-medical: Not on file  Tobacco Use  . Smoking status: Never Smoker  . Smokeless tobacco: Never Used  Substance and Sexual Activity  . Alcohol use: Yes    Comment: social  . Drug use: No  . Sexual activity: Not on file  Lifestyle  . Physical activity:    Days per week: Not on file    Minutes per session: Not on file  . Stress: Not on file  Relationships  . Social connections:    Talks on phone: Not on file    Gets together: Not on file    Attends religious service: Not on file    Active member of club or organization: Not on file    Attends meetings of clubs or organizations: Not on file    Relationship status: Not on file  . Intimate partner violence:    Fear of current or ex partner: Not on file    Emotionally abused: Not on file    Physically abused: Not on file    Forced  sexual activity: Not on file  Other Topics Concern  . Not on file  Social History Narrative   Lives in [place], Retired    Family History  Problem Relation Age of Onset  . Parkinson's disease Mother 16  . Heart failure Father 14  . Cancer Maternal Grandmother   . Heart attack Maternal Grandfather   . Heart attack Paternal Grandmother   . Heart attack Paternal Grandfather     Past Medical History:  Diagnosis Date  .  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 Atrial flutter ([place])   . BPH (benign prostatic hyperplasia)   . Cancer ([place])    melanoma on lower right back and left chest - surgically removed and cleared  . 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 Headache   . 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 Hypercholesteremia   . Hypercholesterolemia   . Mitral valve disease    annuloplasty 2002 [facility]  . Neuropathy   . Neuropathy of both feet    pt states due to exposure to [employer]  . OSA (obstructive sleep apnea)    does not use cpap, Dr. Maxwell Caul told him he had improved  . Pneumonia   . Thoracic ascending aortic aneurysm ([facility])    4.5 cm 03/2018 CT    Past Surgical History:  Procedure Laterality Date  . ABDOMINAL ANGIOGRAM  08/05/12  . aneurysm repair    . CARDIOVERSION  02/12/2012   Procedure: CARDIOVERSION;  Surgeon: Pixie Casino, MD;  Location: [facility] ENDOSCOPY;  Service: Cardiovascular;  Laterality: N/A;  . CARDIOVERSION N/A 06/22/2013   Procedure: CARDIOVERSION;  Surgeon: [name], MD;  Location: [facility];  Service: Cardiovascular;  Laterality: N/A;    . CATARACT EXTRACTION Bilateral 2018   with lens implant  . CHOLECYSTECTOMY    . COLONOSCOPY    . LYMPH NODE BIOPSY Left 04/27/2018   Procedure: EXCISIONAL BIOPSY OF LEFT CERVICAL LYMPH NODE;  Surgeon: Melida Quitter, MD;  Location: [facility];  Service: ENT;  Laterality: Left;  . MENISCUS REPAIR Right 2009  . MITRAL VALVE REPAIR  2002   [facility]  . NM MYOVIEW LTD  07/22/2006   no ischemia  . RIGHT HEART CATH  06/19/2004   normal right heart dynamics. EF 50%  . TEE WITHOUT CARDIOVERSION  02/12/2012   Procedure: TRANSESOPHAGEAL ECHOCARDIOGRAM (TEE);  Surgeon: Pixie Casino, MD;  Location: [facility] ENDOSCOPY;  Service: Cardiovascular;  Laterality: N/A;  . TEE WITHOUT CARDIOVERSION N/A 06/22/2013   Procedure: TRANSESOPHAGEAL ECHOCARDIOGRAM (TEE);  Surgeon: [name], MD;  Location: [facility] ENDOSCOPY;  Service: Cardiovascular;  Laterality: N/A;    Current Outpatient Medications  Medication Sig Dispense Refill  . acetaminophen (TYLENOL) 500 MG tablet Take 1,000 mg by mouth daily as needed for moderate pain.    Marland Kitchen alfuzosin (UROXATRAL) 10 MG 24 hr tablet Take 10 mg by mouth at bedtime.     . candesartan (ATACAND) 4 MG tablet Take 4 mg by mouth at bedtime.     . Cholecalciferol (VITAMIN D) 2000 units tablet Take 2,000 Units by mouth at bedtime.     Marland Kitchen ezetimibe (ZETIA) 10 MG tablet Take 10 mg by mouth at bedtime.     . finasteride (PROSCAR) 5 MG tablet Take 5 mg by mouth daily.     . metoprolol succinate (TOPROL-XL) 25 MG 24 hr tablet Take 12.5 mg by mouth daily.     Marland Kitchen OVER THE COUNTER MEDICATION as needed. Uses lotion with CBD in it for joints    . Polyethyl Glycol-Propyl Glycol (SYSTANE  OP) Place 1 drop into both eyes 2 (two) times daily.     No current facility-administered medications for this visit.     Allergies as of 04/30/2018 - Review Complete 04/30/2018  Allergen Reaction Noted  . Aspirin  04/24/2018  . Levaquin [levofloxacin in d5w]  07/22/2017  . Nickel Itching 07/22/2017  . Statins  Other (See Comments) 03/30/2013  . Ampicillin Rash 11/16/2012    Vitals: BP 121/66   Pulse 77   Ht 6\' 1"  (1.854 m)   Wt 220 lb (99.8 kg)   BMI 29.03 kg/m  Last Weight:  Wt Readings from Last 1 Encounters:  04/30/18 220 lb (99.8 kg)   RCV:ELFY mass index is 29.03 kg/m.     Last Height:   Ht Readings from Last 1 Encounters:  04/30/18 6\' 1"  (1.854 m)    Physical exam:  General: The patient is awake, alert and appears not in acute distress. The patient is well groomed. Head: Normocephalic, atraumatic. Neck is supple. Mallampati 4  neck circumference:17. 5 . Nasal airflow patent ,   Cardiovascular:  Regular rate and rhythm , without  murmurs or carotid bruit, and without distended neck veins. Status post mitral valve repair.  Respiratory: Lungs are clear to auscultation. Skin:  Without evidence of edema, or rash Trunk: BMI is 29. The patient's posture is erect   Neurologic exam : The patient is awake and alert, oriented to place and time.   Attention span & concentration ability appears normal.  Speech is fluent,  without   dysarthria, dysphonia or aphasia.  Mood and affect are appropriate.  Cranial nerves: No change in taste or smell. Pupils are equal and reactive to light.Extraocular movements  in vertical and horizontal planes intact and without nystagmus. Visual fields by finger perimetry are intact.Hearing to finger rub intact.   Facial sensation intact to fine touch.  Facial motor strength is symmetric and tongue and uvula move midline. Shoulder shrug was symmetrical.   Motor exam:  Normal tone, muscle bulk and symmetric strength in all extremities. Sensory:  Fine touch, pinprick and vibration were tested in all extremities. Proprioception tested in the upper extremities was normal. Coordination: Rapid alternating movements in the fingers/hands was normal. Finger-to-nose maneuver  normal without evidence of ataxia, dysmetria or tremor. Gait and station: Patient walks  without assistive device and is able unassisted to climb up to the exam table. Strength within normal limits. Stance is stable and normal. Tandem gait is unfragmented. Turns with 3  Steps.  Deep tendon reflexes: in the  upper and lower extremities are symmetric and intact. Babinski maneuver response is  downgoing.   Assessment:  After physical and neurologic examination, review of laboratory studies,  Personal review of imaging studies, reports of other /same  Imaging studies, results of polysomnography and / or neurophysiology testing and pre-existing records as far as provided in visit., my assessment is   1)confirmed by NCV and EMG is his neuropathy with pain, Pin and needles, cramping and burning. No associated skin dystrophy, cyanosis, all pulses palpable.this affects his sleep.  mother had parkinson's disease. No family history of NP , no genetic testing.  Had Agent orange exposure.  This can be a [facility] claim .   2) Mr. Mucci was diagnosed with mild uncomplicated obstructive sleep apnea in the past, did not feel the benefit from CPAP therapy at the time, could not find a fitting mask.  He is still snoring and he has nocturia so it would be beneficial to  see if he could not find a fitting nasal pillow or nasal cradle mask. He will do better avoiding supine sleep - tennisball method explain.    3) memory concern, delayed word finding.  Montreal Cognitive Assessment  04/30/2018  Visuospatial/ Executive (0/5) 5  Naming (0/3) 3  Attention: Read list of digits (0/2) 2  Attention: Read list of letters (0/1) 1  Attention: Serial 7 subtraction starting at 100 (0/3) 3  Language: Repeat phrase (0/2) 2  Language : Fluency (0/1) 1  Abstraction (0/2) 2  Delayed Recall (0/5) 0  Orientation (0/6) 6  Total 25    The patient was advised of the nature of the diagnosed disorder , the treatment options and the  risks for general health and wellness arising from not treating the condition.   I spent more  than 25 minutes of face to face time with the patient.  Greater than 50% of time was spent in counseling and coordination of care. We have discussed the diagnosis and differential and I answered the patient's questions.    Plan:  Treatment plan and additional workup :  Try Wellbutrin in AM for neuropathy , anxiety, does not increase fall risk.  Offered Klonopin again.  He has not taken  KLONOPIN, TRAZODONE. Adheres to the [place] [employer], MOCA next time again.  RV prn, 6 month  Epworth and [place], MD 09/29/1163, 79:03 AM  Certified in Neurology by ABPN Certified in [place] by [facility] [address] [place], [place] [address]

## 2018-04-30 NOTE — Patient Instructions (Signed)
Bupropion extended-release tablets (Depression/Mood Disorders) What is this medicine? BUPROPION (byoo PROE pee on) is used to treat depression. This medicine may be used for other purposes; ask your health care provider or pharmacist if you have questions. COMMON BRAND NAME(S): Aplenzin, Budeprion XL, Forfivo XL, Wellbutrin XL What should I tell my health care provider before I take this medicine? They need to know if you have any of these conditions: -an eating disorder, such as anorexia or bulimia -bipolar disorder or psychosis -diabetes or high blood sugar, treated with medication -glaucoma -head injury or brain tumor -heart disease, previous heart attack, or irregular heart beat -high blood pressure -kidney or liver disease -seizures (convulsions) -suicidal thoughts or a previous suicide attempt -Tourette's syndrome -weight loss -an unusual or allergic reaction to bupropion, other medicines, foods, dyes, or preservatives -breast-feeding -pregnant or trying to become pregnant How should I use this medicine? Take this medicine by mouth with a glass of water. Follow the directions on the prescription label. You can take it with or without food. If it upsets your stomach, take it with food. Do not crush, chew, or cut these tablets. This medicine is taken once daily at the same time each day. Do not take your medicine more often than directed. Do not stop taking this medicine suddenly except upon the advice of your doctor. Stopping this medicine too quickly may cause serious side effects or your condition may worsen. A special MedGuide will be given to you by the pharmacist with each prescription and refill. Be sure to read this information carefully each time. Talk to your pediatrician regarding the use of this medicine in children. Special care may be needed. Overdosage: If you think you have taken too much of this medicine contact a poison control center or emergency room at once. NOTE:  This medicine is only for you. Do not share this medicine with others. What if I miss a dose? If you miss a dose, skip the missed dose and take your next tablet at the regular time. Do not take double or extra doses. What may interact with this medicine? Do not take this medicine with any of the following medications: -linezolid -MAOIs like Azilect, Carbex, Eldepryl, Marplan, Nardil, and Parnate -methylene blue (injected into a vein) -other medicines that contain bupropion like Zyban This medicine may also interact with the following medications: -alcohol -certain medicines for anxiety or sleep -certain medicines for blood pressure like metoprolol, propranolol -certain medicines for depression or psychotic disturbances -certain medicines for HIV or AIDS like efavirenz, lopinavir, nelfinavir, ritonavir -certain medicines for irregular heart beat like propafenone, flecainide -certain medicines for Parkinson's disease like amantadine, levodopa -certain medicines for seizures like carbamazepine, phenytoin, phenobarbital -cimetidine -clopidogrel -cyclophosphamide -digoxin -furazolidone -isoniazid -nicotine -orphenadrine -procarbazine -steroid medicines like prednisone or cortisone -stimulant medicines for attention disorders, weight loss, or to stay awake -tamoxifen -theophylline -thiotepa -ticlopidine -tramadol -warfarin This list may not describe all possible interactions. Give your health care provider a list of all the medicines, herbs, non-prescription drugs, or dietary supplements you use. Also tell them if you smoke, drink alcohol, or use illegal drugs. Some items may interact with your medicine. What should I watch for while using this medicine? Tell your doctor if your symptoms do not get better or if they get worse. Visit your doctor or health care professional for regular checks on your progress. Because it may take several weeks to see the full effects of this medicine, it  is important to continue your treatment as   your medicine.  What should I watch for while using this medicine?  Tell your doctor if your symptoms do not get better or if they get worse. Visit your doctor or health care professional for regular checks on your progress. Because it may take several weeks to see the full effects of this medicine, it  is important to continue your treatment as prescribed by your doctor.  Patients and their families should watch out for new or worsening thoughts of suicide or depression. Also watch out for sudden changes in feelings such as feeling anxious, agitated, panicky, irritable, hostile, aggressive, impulsive, severely restless, overly excited and hyperactive, or not being able to sleep. If this happens, especially at the beginning of treatment or after a change in dose, call your health care professional.  Avoid alcoholic drinks while taking this medicine. Drinking large amounts of alcoholic beverages, using sleeping or anxiety medicines, or quickly stopping the use of these agents while taking this medicine may increase your risk for a seizure.  Do not drive or use heavy machinery until you know how this medicine affects you. This medicine can impair your ability to perform these tasks.  Do not take this medicine close to bedtime. It may prevent you from sleeping.  Your mouth may get dry. Chewing sugarless gum or sucking hard candy, and drinking plenty of water may help. Contact your doctor if the problem does not go away or is severe.  The tablet shell for some brands of this medicine does not dissolve. This is normal. The tablet shell may appear whole in the stool. This is not a cause for concern.  What side effects may I notice from receiving this medicine?  Side effects that you should report to your doctor or health care professional as soon as possible:  -allergic reactions like skin rash, itching or hives, swelling of the face, lips, or tongue  -breathing problems  -changes in vision  -confusion  -elevated mood, decreased need for sleep, racing thoughts, impulsive behavior  -fast or irregular heartbeat  -hallucinations, loss of contact with reality  -increased blood pressure  -redness, blistering, peeling or loosening of the skin, including inside the mouth  -seizures  -suicidal thoughts or other mood  changes  -unusually weak or tired  -vomiting  Side effects that usually do not require medical attention (report to your doctor or health care professional if they continue or are bothersome):  -constipation  -headache  -loss of appetite  -nausea  -tremors  -weight loss  This list may not describe all possible side effects. Call your doctor for medical advice about side effects. You may report side effects to FDA at 1-800-FDA-1088.  Where should I keep my medicine?  Keep out of the reach of children.  Store at room temperature between 15 and 30 degrees C (59 and 86 degrees F). Throw away any unused medicine after the expiration date.  NOTE: This sheet is a summary. It may not cover all possible information. If you have questions about this medicine, talk to your doctor, pharmacist, or health care provider.  © 2019 [employer]vier/Gold Standard (2015-09-01 13:55:13)

## 2018-05-08 DIAGNOSIS — R590 Localized enlarged lymph nodes: Secondary | ICD-10-CM | POA: Diagnosis not present

## 2018-05-08 DIAGNOSIS — Z114 Encounter for screening for human immunodeficiency virus [HIV]: Secondary | ICD-10-CM | POA: Diagnosis not present

## 2018-05-08 DIAGNOSIS — Z1159 Encounter for screening for other viral diseases: Secondary | ICD-10-CM | POA: Diagnosis not present

## 2018-05-08 DIAGNOSIS — C8310 Mantle cell lymphoma, unspecified site: Secondary | ICD-10-CM | POA: Diagnosis not present

## 2018-05-08 DIAGNOSIS — C8314 Mantle cell lymphoma, lymph nodes of axilla and upper limb: Secondary | ICD-10-CM | POA: Diagnosis not present

## 2018-05-12 DIAGNOSIS — C8310 Mantle cell lymphoma, unspecified site: Secondary | ICD-10-CM | POA: Diagnosis not present

## 2018-05-12 DIAGNOSIS — C8590 Non-Hodgkin lymphoma, unspecified, unspecified site: Secondary | ICD-10-CM | POA: Diagnosis not present

## 2018-05-13 DIAGNOSIS — C8310 Mantle cell lymphoma, unspecified site: Secondary | ICD-10-CM | POA: Diagnosis not present

## 2018-05-13 DIAGNOSIS — C8314 Mantle cell lymphoma, lymph nodes of axilla and upper limb: Secondary | ICD-10-CM | POA: Diagnosis not present

## 2018-05-14 ENCOUNTER — Telehealth: Payer: Self-pay

## 2018-05-14 DIAGNOSIS — F418 Other specified anxiety disorders: Secondary | ICD-10-CM | POA: Diagnosis not present

## 2018-05-14 DIAGNOSIS — R599 Enlarged lymph nodes, unspecified: Secondary | ICD-10-CM | POA: Diagnosis not present

## 2018-05-14 DIAGNOSIS — G629 Polyneuropathy, unspecified: Secondary | ICD-10-CM | POA: Diagnosis not present

## 2018-05-14 NOTE — Telephone Encounter (Signed)
Called Misty Redenour, [employer] at facility and offered appt for today at 2:15, tomorrow at 1:45 and Monday 2/24 at 1015 or 1115. She will offer times to the family and ask me to call back in 15 minutes.

## 2018-05-14 NOTE — Telephone Encounter (Signed)
Called back. They are not available for any of the offered appts. Appt given for 2/27 at 2 pm, instructed to check in 30 minutes prior to scheduled time. Misty verbalized understanding and will give appt details to patient and family.

## 2018-05-15 DIAGNOSIS — C8310 Mantle cell lymphoma, unspecified site: Secondary | ICD-10-CM | POA: Diagnosis not present

## 2018-05-18 ENCOUNTER — Encounter: Payer: Self-pay | Admitting: Hematology and Oncology

## 2018-05-18 DIAGNOSIS — C8310 Mantle cell lymphoma, unspecified site: Secondary | ICD-10-CM | POA: Diagnosis not present

## 2018-05-19 DIAGNOSIS — Z79899 Other long term (current) drug therapy: Secondary | ICD-10-CM | POA: Diagnosis not present

## 2018-05-19 DIAGNOSIS — G629 Polyneuropathy, unspecified: Secondary | ICD-10-CM | POA: Diagnosis not present

## 2018-05-19 DIAGNOSIS — I6529 Occlusion and stenosis of unspecified carotid artery: Secondary | ICD-10-CM | POA: Diagnosis not present

## 2018-05-19 DIAGNOSIS — I4892 Unspecified atrial flutter: Secondary | ICD-10-CM | POA: Diagnosis not present

## 2018-05-19 DIAGNOSIS — C8310 Mantle cell lymphoma, unspecified site: Secondary | ICD-10-CM | POA: Diagnosis not present

## 2018-05-19 DIAGNOSIS — G4733 Obstructive sleep apnea (adult) (pediatric): Secondary | ICD-10-CM | POA: Diagnosis not present

## 2018-05-19 DIAGNOSIS — I509 Heart failure, unspecified: Secondary | ICD-10-CM | POA: Diagnosis not present

## 2018-05-19 DIAGNOSIS — Z85820 Personal history of malignant melanoma of skin: Secondary | ICD-10-CM | POA: Diagnosis not present

## 2018-05-19 DIAGNOSIS — N400 Enlarged prostate without lower urinary tract symptoms: Secondary | ICD-10-CM | POA: Diagnosis not present

## 2018-05-19 DIAGNOSIS — Z8679 Personal history of other diseases of the circulatory system: Secondary | ICD-10-CM | POA: Diagnosis not present

## 2018-05-19 DIAGNOSIS — Z9849 Cataract extraction status, unspecified eye: Secondary | ICD-10-CM | POA: Diagnosis not present

## 2018-05-19 DIAGNOSIS — C8318 Mantle cell lymphoma, lymph nodes of multiple sites: Secondary | ICD-10-CM | POA: Diagnosis not present

## 2018-05-21 ENCOUNTER — Encounter: Payer: Self-pay | Admitting: Hematology and Oncology

## 2018-05-21 ENCOUNTER — Inpatient Hospital Stay: Payer: Medicare Other | Attending: Hematology and Oncology | Admitting: Hematology and Oncology

## 2018-05-21 ENCOUNTER — Telehealth: Payer: Self-pay | Admitting: Hematology and Oncology

## 2018-05-21 VITALS — BP 116/58 | HR 68 | Temp 97.8°F | Resp 18 | Ht 73.0 in | Wt 222.2 lb

## 2018-05-21 DIAGNOSIS — G629 Polyneuropathy, unspecified: Secondary | ICD-10-CM

## 2018-05-21 DIAGNOSIS — C8318 Mantle cell lymphoma, lymph nodes of multiple sites: Secondary | ICD-10-CM | POA: Insufficient documentation

## 2018-05-21 DIAGNOSIS — Z79899 Other long term (current) drug therapy: Secondary | ICD-10-CM | POA: Diagnosis not present

## 2018-05-21 NOTE — Progress Notes (Signed)
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[facility]) The patient had PET CT scan done at [facility] Based on the report, the patient has at least stage III disease I do not believe strongly that he would need bone marrow biopsy to complete staging I recommend port placement, chemo education class and repeat blood work next week I will see him back for further discussion and chemotherapy consent, we will plan to start him on first dose of treatment by the second week of March I recommend minimum 3 cycles of chemotherapy before repeat imaging study followed by 3 more cycles of therapy I agree with recommendation from [place] to proceed with combination chemotherapy of bendamustine and rituximab  Neuropathy He has nonspecific peripheral neuropathy with extensive work-up by neurologist in the past Myeloma panel that was done was negative.  Serum vitamin B12 was within normal limits He will continue close follow-up with neurologist.   Orders Placed This Encounter  Procedures  . IR IMAGING GUIDED PORT INSERTION    Standing Status:   Future    Standing Expiration Date:   07/20/2019    Order Specific Question:   Reason for Exam (SYMPTOM  OR DIAGNOSIS REQUIRED)    Answer:   need chemo    Order Specific Question:   Preferred Imaging Location?    Answer:   [facility]  . Comprehensive metabolic panel    Standing Status:   Future    Standing Expiration Date:   06/25/2019  . CBC with Differential/Platelet    Standing Status:   Future    Standing Expiration Date:   06/25/2019  . Lactate dehydrogenase    Standing Status:   Future    Standing Expiration Date:   05/22/2019  . Uric acid    Standing Status:   Future    Standing Expiration Date:   05/22/2019  . Hepatitis B surface antibody,qualitative    Standing Status:   Future   Standing Expiration Date:   06/25/2019  . Hepatitis B core antibody, IgM    Standing Status:   Future    Standing Expiration Date:   06/25/2019  . Hepatitis B surface antigen    Standing Status:   Future    Standing Expiration Date:   06/25/2019     CHIEF COMPLAINTS/PURPOSE OF CONSULTATION:  Newly diagnosed mantle cell lymphoma, for further management  HISTORY OF PRESENTING ILLNESS:  Sean Stanley 78 y.o. male is here because of recent diagnosis of lymphoma. 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 He has 2 daughters. The patient had history of aneurysm rupture causing significant hemorrhagic shock.  He had close follow-up with cardiologist and imaging studies for aneurysm at [facility]. His last CT imaging from March 2019 did not reveal evidence of abnormal lymphadenopathy Late last year, he palpated lymphadenopathy on the left side of his neck.  He underwent further imaging study in January with CT scan of the neck and subsequent referral to ENT. His initial biopsy was unremarkable leading to decision for excisional lymph node biopsy which confirmed the diagnosis of mantle cell lymphoma The patient has obtained second opinion at [facility] as well as [facility] I was not able to review recommendation from [facility], he underwent PET CT scan last week which show lymphadenopathy both above  and below the diaphragm with possible splenic involvement His blood work was unremarkable except for mildly elevated LDH per [facility] parameters.  Hepatitis C and HIV screening tests were negative He denies abnormal anorexia, weight loss, night sweats or other B symptoms  I have reviewed his chart and materials related to his cancer extensively and collaborated history with the patient. Summary of oncologic history is as follows:   Mantle cell lymphoma ([place])   06/13/2017 Imaging    Ct imaging at [facility] 1. 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 2. 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 3. Slight reduction in size of right retroperitoneal evolving hematoma.    03/30/2018 Imaging    Ct neck 1. Extensive adenopathy on the left. The largest node is a level 1/submandibular node measuring 38 x 24 x 22 mm. Largest level 2 node measures 3.2 x 2 x 2 cm. Numerous other smaller but round lymph nodes throughout the left neck in the level 2 through level 4 region. The largest level 5 node measures 3.4 x 3.4 x 2.3 cm with a transverse diameter of 2.3 cm. This contains some internal calcifications. There are numerous other pathologic nodes in the left supraclavicular to axillary region. This pattern of disease could be due to extensive left neck metastatic disease from unknown primary, lymphoma, or other systemic malignancy. 2. No evidence of mucosal or submucosal lesion. 3. Diameter of the ascending aorta is 4.5 cm. Recommend annual imaging followup by CTA or MRA. Aortic Atherosclerosis (ICD10-I70.0).     04/10/2018 Pathology Results    Left zone 1 neck mass, [name], [name], the findings indicate an atypical lymphoid proliferation but are too limited  for definitive diagnosis. Excisional biopsy with flow cytometry is recommended.    04/10/2018 Procedure    He was ENT who performed FNA of neck LN    04/27/2018 Pathology Results    Lymph node for lymphoma, [name], [name], [name], [name], [name], [name], [name], the features are consistent with a mantle cell lymphoma    05/13/2018 PET scan    1. Hypermetabolic left cervical lymphadenopathy, left axillary lymphadenopathy, mediastinal / right hilar lymphadenopathy, right external iliac lymphadenopathy, and subcentimeter left external iliac and left inguinal lymph nodes, [name], [name], [name], without focal  lesions, that may represent lymphomatous involvement of the spleen.    05/21/2018 Cancer Staging    Staging form: Hodgkin and Non-Hodgkin Lymphoma, AJCC 8th Edition - Clinical: Stage III - Signed by Heath Lark, MD on 05/21/2018    He has significant peripheral neuropathy possibly from agent orange exposure when he served in the [employer].  The neuropathy extends up to his ankle, with discomfort especially at nighttime or after strenuous activity.  He felt stiffness in his toes and painful sensation which he described as burning in his feet. The patient  has declined taking medication for neuropathy. Since his biopsy, he denies significant pain.  MEDICAL HISTORY:  Past Medical History:  Diagnosis Date  . 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 Atrial flutter ([place])   . BPH (benign prostatic hyperplasia)   . Cancer ([place])    melanoma on lower right back and left chest - surgically removed and cleared  . 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 Headache   . 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 Hypercholesteremia   . Hypercholesterolemia   . Mitral valve disease    annuloplasty 2002 [facility]  . Neuropathy   . Neuropathy of both feet    pt states due to exposure to [employer]  . OSA (obstructive sleep apnea)    does not use cpap, Dr. Maxwell Caul told him he had improved  . Pneumonia   . Thoracic ascending aortic aneurysm ([place])    4.5 cm 03/2018 CT    SURGICAL HISTORY: Past Surgical History:  Procedure Laterality Date  . ABDOMINAL ANGIOGRAM  08/05/12  . aneurysm repair    . CARDIOVERSION  02/12/2012   Procedure: CARDIOVERSION;  Surgeon: Pixie Casino, MD;  Location: [facility] ENDOSCOPY;  Service: Cardiovascular;  Laterality: N/A;  . CARDIOVERSION N/A 06/22/2013   Procedure: CARDIOVERSION;  Surgeon: [name], MD;  Location: [facility];  Service: Cardiovascular;  Laterality: N/A;  . CATARACT EXTRACTION Bilateral 2018   with lens implant  . CHOLECYSTECTOMY    . COLONOSCOPY    . LYMPH NODE BIOPSY Left 04/27/2018   Procedure: EXCISIONAL BIOPSY OF LEFT CERVICAL LYMPH NODE;  Surgeon: Melida Quitter, MD;  Location: [facility];  Service: ENT;   Laterality: Left;  . MENISCUS REPAIR Right 2009  . MITRAL VALVE REPAIR  2002   [facility]  . NM MYOVIEW LTD  07/22/2006   no ischemia  . RIGHT HEART CATH  06/19/2004   normal right heart dynamics. EF 50%  . TEE WITHOUT CARDIOVERSION  02/12/2012   Procedure: TRANSESOPHAGEAL ECHOCARDIOGRAM (TEE);  Surgeon: Pixie Casino, MD;  Location: [facility] ENDOSCOPY;  Service: Cardiovascular;  Laterality: N/A;  . TEE WITHOUT CARDIOVERSION N/A 06/22/2013   Procedure: TRANSESOPHAGEAL ECHOCARDIOGRAM (TEE);  Surgeon: [name], MD;  Location: [facility] ENDOSCOPY;  Service: Cardiovascular;  Laterality: N/A;    SOCIAL HISTORY: Social History   Socioeconomic History  . Marital status: Married    Spouse name: Sean Stanley  . Number of children: 2  . Years of education: Not on file  . Highest  education level: Not on file  Occupational History  . Occupation: retired Hydrologist  Social Needs  . Financial resource strain: Not on file  . Food insecurity:    Worry: Not on file    Inability: Not on file  . Transportation needs:    Medical: Not on file    Non-medical: Not on file  Tobacco Use  . Smoking status: Never Smoker  . Smokeless tobacco: Never Used  Substance and Sexual Activity  . Alcohol use: Yes    Comment: social  . Drug use: No  . Sexual activity: Not on file  Lifestyle  . Physical activity:    Days per week: Not on file    Minutes per session: Not on file  . Stress: Not on file  Relationships  . Social connections:    Talks on phone: Not on file    Gets together: Not on file    Attends religious service: Not on file    Active member of club or organization: Not on file    Attends meetings of clubs or organizations: Not on file    Relationship status: Not on file  . Intimate partner violence:    Fear of current or ex partner: Not on file    Emotionally abused: Not on file    Physically abused: Not on file    Forced sexual activity: Not on file  Other Topics Concern  . Not on file  Social History  Narrative   Lives in [place], Retired    FAMILY HISTORY: Family History  Problem Relation Age of Onset  . Parkinson's disease Mother 28  . Heart failure Father 78  . Cancer Maternal Grandmother   . Heart attack Maternal Grandfather   . Heart attack Paternal Grandmother   . Heart attack Paternal Grandfather     ALLERGIES:  is allergic to aspirin; levaquin [levofloxacin in d5w]; nickel; statins; and ampicillin.  MEDICATIONS:  Current Outpatient Medications  Medication Sig Dispense Refill  . acetaminophen (TYLENOL) 500 MG tablet Take 1,000 mg by mouth daily as needed for moderate pain.    Marland Kitchen alfuzosin (UROXATRAL) 10 MG 24 hr tablet Take 10 mg by mouth at bedtime.     Marland Kitchen buPROPion (WELLBUTRIN XL) 150 MG 24 hr tablet Take 1 tablet (150 mg total) by mouth daily. 30 tablet 5  . candesartan (ATACAND) 4 MG tablet Take 4 mg by mouth at bedtime.     . Cholecalciferol (VITAMIN D) 2000 units tablet Take 2,000 Units by mouth at bedtime.     . clonazePAM (KLONOPIN) 0.5 MG tablet Take 0.5 tablets (0.25 mg total) by mouth at bedtime as needed for anxiety. 30 tablet 1  . ezetimibe (ZETIA) 10 MG tablet Take 10 mg by mouth at bedtime.     . finasteride (PROSCAR) 5 MG tablet Take 5 mg by mouth daily.     . metoprolol succinate (TOPROL-XL) 25 MG 24 hr tablet Take 12.5 mg by mouth daily.     Marland Kitchen OVER THE COUNTER MEDICATION as needed. Uses lotion with CBD in it for joints    . Polyethyl Glycol-Propyl Glycol (SYSTANE OP) Place 1 drop into both eyes 2 (two) times daily.     No current facility-administered medications for this visit.     REVIEW OF SYSTEMS:   Constitutional: Denies fevers, chills or abnormal night sweats Eyes: Denies blurriness of vision, double vision or watery eyes Ears, nose, mouth, throat, and face: Denies mucositis or sore throat Respiratory: Denies cough, dyspnea or wheezes  Cardiovascular: Denies palpitation, chest discomfort or lower extremity swelling Gastrointestinal:  Denies  nausea, heartburn or change in bowel habits Skin: Denies abnormal skin rashes Behavioral/Psych: Mood is stable, no new changes  All other systems were reviewed with the patient and are negative.  PHYSICAL EXAMINATION: ECOG PERFORMANCE STATUS: 1 - Symptomatic but completely ambulatory  Vitals:   05/21/18 1421  BP: (!) 116/58  Pulse: 68  Resp: 18  Temp: 97.8 F (36.6 C)  SpO2: 98%   Filed Weights   05/21/18 1421  Weight: 222 lb 3.2 oz (100.8 kg)    GENERAL:alert, no distress and comfortable SKIN: skin color, texture, turgor are normal, no rashes or significant lesions EYES: normal, conjunctiva are pink and non-injected, sclera clear OROPHARYNX:no exudate, no erythema and lips, buccal mucosa, and tongue normal  NECK: supple, thyroid normal size, non-tender, without nodularity LYMPH: He has palpable lymphadenopathy in the neck and axillary region LUNGS: clear to auscultation and percussion with normal breathing effort HEART: regular rate & rhythm and no murmurs and no lower extremity edema ABDOMEN:abdomen soft, non-tender and normal bowel sounds.  No splenomegaly Musculoskeletal:no cyanosis of digits and no clubbing  PSYCH: alert & oriented x 3 with fluent speech NEURO: no focal motor/sensory deficits  LABORATORY DATA:  I have reviewed the data as listed Lab Results  Component Value Date   WBC 4.9 04/27/2018   HGB 13.4 04/27/2018   HCT 41.6 04/27/2018   MCV 91.0 04/27/2018   PLT 158 04/27/2018   Recent Labs    07/23/17 1504 04/27/18 0735  NA  --  141  K  --  4.0  CL  --  109  CO2  --  22  GLUCOSE  --  98  BUN  --  26*  CREATININE  --  1.15  CALCIUM  --  8.8*  GFRNONAA  --  >60  GFRAA  --  >60  PROT 6.6  --     RADIOGRAPHIC STUDIES: I have reviewed CT imaging here I have personally reviewed the radiological images as listed and agreed with the findings in the report.   I spent 60 minutes counseling the patient face to face. The total time spent in the  appointment was 80 minutes and more than 50% was on counseling.  All questions were answered. The patient knows to call the clinic with any problems, questions or concerns.  Heath Lark, MD 05/21/2018 3:52 PM

## 2018-05-21 NOTE — Patient Instructions (Signed)
Bendamustine Injection What is this medicine? BENDAMUSTINE (BEN da MUS teen) is a chemotherapy drug. It is used to treat chronic lymphocytic leukemia and non-Hodgkin lymphoma. This medicine may be used for other purposes; ask your health care provider or pharmacist if you have questions. COMMON BRAND NAME(S): BENDEKA, Treanda What should I tell my health care provider before I take this medicine? They need to know if you have any of these conditions: -infection (especially a virus infection such as chickenpox, cold sores, or herpes) -kidney disease -liver disease -an unusual or allergic reaction to bendamustine, mannitol, other medicines, foods, dyes, or preservatives -pregnant or trying to get pregnant -breast-feeding How should I use this medicine? This medicine is for infusion into a vein. It is given by a health care professional in a hospital or clinic setting. Talk to your pediatrician regarding the use of this medicine in children. Special care may be needed. Overdosage: If you think you have taken too much of this medicine contact a poison control center or emergency room at once. NOTE: This medicine is only for you. Do not share this medicine with others. What if I miss a dose? It is important not to miss your dose. Call your doctor or health care professional if you are unable to keep an appointment. What may interact with this medicine? Do not take this medicine with any of the following medications: -clozapine This medicine may also interact with the following medications: -atazanavir -cimetidine -ciprofloxacin -enoxacin -fluvoxamine -medicines for seizures like carbamazepine and phenobarbital -mexiletine -rifampin -tacrine -thiabendazole -zileuton This list may not describe all possible interactions. Give your health care provider a list of all the medicines, herbs, non-prescription drugs, or dietary supplements you use. Also tell them if you smoke, drink alcohol, or  use illegal drugs. Some items may interact with your medicine. What should I watch for while using this medicine? This drug may make you feel generally unwell. This is not uncommon, as chemotherapy can affect healthy cells as well as cancer cells. Report any side effects. Continue your course of treatment even though you feel ill unless your doctor tells you to stop. You may need blood work done while you are taking this medicine. Call your doctor or health care professional for advice if you get a fever, chills or sore throat, or other symptoms of a cold or flu. Do not treat yourself. This drug decreases your body's ability to fight infections. Try to avoid being around people who are sick. This medicine may increase your risk to bruise or bleed. Call your doctor or health care professional if you notice any unusual bleeding. Talk to your doctor about your risk of cancer. You may be more at risk for certain types of cancers if you take this medicine. Do not become pregnant while taking this medicine or for 3 months after stopping it. Women should inform their doctor if they wish to become pregnant or think they might be pregnant. Men should not father a child while taking this medicine and for 3 months after stopping it.There is a potential for serious side effects to an unborn child. Talk to your health care professional or pharmacist for more information. Do not breast-feed an infant while taking this medicine. This medicine may interfere with the ability to have a child. You should talk with your doctor or health care professional if you are concerned about your fertility. What side effects may I notice from receiving this medicine? Side effects that you should report to your doctor   with the ability to have a child. You should talk with your doctor or health care professional if you are concerned about your fertility.  What side effects may I notice from receiving this medicine?  Side effects that you should report to your doctor or health care professional as soon as possible:  -allergic reactions like skin rash, itching or hives, swelling of the face, lips, or tongue  -low blood counts - this medicine may decrease the number of white blood cells,  red blood cells and platelets. You may be at increased risk for infections and bleeding.  -redness, blistering, peeling or loosening of the skin, including inside the mouth  -signs of infection - fever or chills, cough, sore throat, pain or difficulty passing urine  -signs of decreased platelets or bleeding - bruising, pinpoint red spots on the skin, black, tarry stools, blood in the urine  -signs of decreased red blood cells - unusually weak or tired, fainting spells, lightheadedness  -signs and symptoms of kidney injury like trouble passing urine or change in the amount of urine  -signs and symptoms of liver injury like dark yellow or brown urine; general ill feeling or flu-like symptoms; light-colored stools; loss of appetite; nausea; right upper belly pain; unusually weak or tired; yellowing of the eyes or skin  Side effects that usually do not require medical attention (report to your doctor or health care professional if they continue or are bothersome):  -constipation  -decreased appetite  -diarrhea  -headache  -mouth sores  -nausea/vomiting  -tiredness  This list may not describe all possible side effects. Call your doctor for medical advice about side effects. You may report side effects to FDA at 1-800-FDA-1088.  Where should I keep my medicine?  This drug is given in a hospital or clinic and will not be stored at home.  NOTE: This sheet is a summary. It may not cover all possible information. If you have questions about this medicine, talk to your doctor, pharmacist, or health care provider.   2019 Elsevier/Gold Standard (2015-01-12 08:45:41)  Rituximab injection  What is this medicine?  RITUXIMAB (ri TUX i mab) is a monoclonal antibody. It is used to treat certain types of cancer like non-Hodgkin lymphoma and chronic lymphocytic leukemia. It is also used to treat rheumatoid arthritis, granulomatosis with polyangiitis (or Wegener's granulomatosis), microscopic polyangiitis, and pemphigus vulgaris.  This  medicine may be used for other purposes; ask your health care provider or pharmacist if you have questions.  COMMON BRAND NAME(S): Rituxan  What should I tell my health care provider before I take this medicine?  They need to know if you have any of these conditions:  -heart disease  -infection (especially a virus infection such as hepatitis B, chickenpox, cold sores, or herpes)  -immune system problems  -irregular heartbeat  -kidney disease  -low blood counts, like low white cell, platelet, or red cell counts  -lung or breathing disease, like asthma  -recently received or scheduled to receive a vaccine  -an unusual or allergic reaction to rituximab, other medicines, foods, dyes, or preservatives  -pregnant or trying to get pregnant  -breast-feeding  How should I use this medicine?  This medicine is for infusion into a vein. It is administered in a hospital or clinic by a specially trained health care professional.  A special MedGuide will be given to you by the pharmacist with each prescription and refill. Be sure to read this information carefully each time.  Talk to your pediatrician regarding   the use of this medicine in children. This medicine is not approved for use in children.  Overdosage: If you think you have taken too much of this medicine contact a poison control center or emergency room at once.  NOTE: This medicine is only for you. Do not share this medicine with others.  What if I miss a dose?  It is important not to miss a dose. Call your doctor or health care professional if you are unable to keep an appointment.  What may interact with this medicine?  -cisplatin  -live virus vaccines  This list may not describe all possible interactions. Give your health care provider a list of all the medicines, herbs, non-prescription drugs, or dietary supplements you use. Also tell them if you smoke, drink alcohol, or use illegal drugs. Some items may interact with your medicine.  What should I watch for while  using this medicine?  Your condition will be monitored carefully while you are receiving this medicine. You may need blood work done while you are taking this medicine.  This medicine can cause serious allergic reactions. To reduce your risk you may need to take medicine before treatment with this medicine. Take your medicine as directed.  In some patients, this medicine may cause a serious brain infection that may cause death. If you have any problems seeing, thinking, speaking, walking, or standing, tell your healthcare professional right away. If you cannot reach your healthcare professional, urgently seek other source of medical care.  Call your doctor or health care professional for advice if you get a fever, chills or sore throat, or other symptoms of a cold or flu. Do not treat yourself. This drug decreases your body's ability to fight infections. Try to avoid being around people who are sick.  Do not become pregnant while taking this medicine or for 12 months after stopping it. Women should inform their doctor if they wish to become pregnant or think they might be pregnant. There is a potential for serious side effects to an unborn child. Talk to your health care professional or pharmacist for more information. Do not breast-feed an infant while taking this medicine or for 6 months after stopping it.  What side effects may I notice from receiving this medicine?  Side effects that you should report to your doctor or health care professional as soon as possible:  -allergic reactions like skin rash, itching or hives; swelling of the face, lips, or tongue  -breathing problems  -chest pain  -changes in vision  -diarrhea  -headache with fever, neck stiffness, sensitivity to light, nausea, or confusion  -fast, irregular heartbeat  -loss of memory  -low blood counts - this medicine may decrease the number of white blood cells, red blood cells and platelets. You may be at increased risk for infections and  bleeding.  -mouth sores  -problems with balance, talking, or walking  -redness, blistering, peeling or loosening of the skin, including inside the mouth  -signs of infection - fever or chills, cough, sore throat, pain or difficulty passing urine  -signs and symptoms of kidney injury like trouble passing urine or change in the amount of urine  -signs and symptoms of liver injury like dark yellow or brown urine; general ill feeling or flu-like symptoms; light-colored stools; loss of appetite; nausea; right upper belly pain; unusually weak or tired; yellowing of the eyes or skin  -signs and symptoms of low blood pressure like dizziness; feeling faint or lightheaded, falls; unusually weak or tired  -  stomach pain  -swelling of the ankles, feet, hands  -unusual bleeding or bruising  -vomiting  Side effects that usually do not require medical attention (report to your doctor or health care professional if they continue or are bothersome):  -headache  -joint pain  -muscle cramps or muscle pain  -nausea  -tiredness  This list may not describe all possible side effects. Call your doctor for medical advice about side effects. You may report side effects to FDA at 1-800-FDA-1088.  Where should I keep my medicine?  This drug is given in a hospital or clinic and will not be stored at home.  NOTE: This sheet is a summary. It may not cover all possible information. If you have questions about this medicine, talk to your doctor, pharmacist, or health care provider.   2019 Elsevier/Gold Standard (2017-02-21 13:04:32)

## 2018-05-21 NOTE — Progress Notes (Signed)
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

## 2018-05-21 NOTE — Assessment & Plan Note (Signed)
The patient had PET CT scan done at [facility] Based on the report, the patient has at least stage III disease I do not believe strongly that he would need bone marrow biopsy to complete staging I recommend port placement, chemo education class and repeat blood work next week I will see him back for further discussion and chemotherapy consent, we will plan to start him on first dose of treatment by the second week of March I recommend minimum 3 cycles of chemotherapy before repeat imaging study followed by 3 more cycles of therapy I agree with recommendation from [facility] to proceed with combination chemotherapy of bendamustine and rituximab

## 2018-05-21 NOTE — Telephone Encounter (Signed)
Gave avs and calendar waiting for approval for 3/9

## 2018-05-21 NOTE — Assessment & Plan Note (Signed)
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  Serum vitamin B12 was within normal limits He will continue close follow-up with neurologist.

## 2018-05-22 ENCOUNTER — Other Ambulatory Visit: Payer: Self-pay | Admitting: Radiology

## 2018-05-22 ENCOUNTER — Telehealth: Payer: Self-pay | Admitting: Hematology and Oncology

## 2018-05-22 NOTE — Telephone Encounter (Signed)
Called regarding 3/9

## 2018-05-25 ENCOUNTER — Telehealth: Payer: Self-pay | Admitting: Hematology and Oncology

## 2018-05-25 ENCOUNTER — Inpatient Hospital Stay (HOSPITAL_BASED_OUTPATIENT_CLINIC_OR_DEPARTMENT_OTHER): Payer: Medicare Other | Admitting: Hematology and Oncology

## 2018-05-25 ENCOUNTER — Inpatient Hospital Stay: Payer: Medicare Other

## 2018-05-25 ENCOUNTER — Inpatient Hospital Stay: Payer: Medicare Other | Attending: Hematology and Oncology

## 2018-05-25 VITALS — BP 122/58 | HR 65 | Temp 97.5°F | Resp 18 | Ht 73.0 in | Wt 218.8 lb

## 2018-05-25 DIAGNOSIS — N390 Urinary tract infection, site not specified: Secondary | ICD-10-CM | POA: Diagnosis not present

## 2018-05-25 DIAGNOSIS — N179 Acute kidney failure, unspecified: Secondary | ICD-10-CM | POA: Diagnosis not present

## 2018-05-25 DIAGNOSIS — Z5112 Encounter for antineoplastic immunotherapy: Secondary | ICD-10-CM | POA: Diagnosis present

## 2018-05-25 DIAGNOSIS — L270 Generalized skin eruption due to drugs and medicaments taken internally: Secondary | ICD-10-CM | POA: Diagnosis not present

## 2018-05-25 DIAGNOSIS — Z5111 Encounter for antineoplastic chemotherapy: Secondary | ICD-10-CM | POA: Insufficient documentation

## 2018-05-25 DIAGNOSIS — C8318 Mantle cell lymphoma, lymph nodes of multiple sites: Secondary | ICD-10-CM

## 2018-05-25 DIAGNOSIS — Z79899 Other long term (current) drug therapy: Secondary | ICD-10-CM

## 2018-05-25 DIAGNOSIS — Z112 Encounter for screening for other bacterial diseases: Secondary | ICD-10-CM | POA: Insufficient documentation

## 2018-05-25 DIAGNOSIS — E860 Dehydration: Secondary | ICD-10-CM | POA: Diagnosis not present

## 2018-05-25 DIAGNOSIS — G629 Polyneuropathy, unspecified: Secondary | ICD-10-CM | POA: Insufficient documentation

## 2018-05-25 LAB — CBC WITH DIFFERENTIAL/PLATELET
Abs Immature Granulocytes: 0.01 10*3/uL (ref 0.00–0.07)
Basophils Absolute: 0 10*3/uL (ref 0.0–0.1)
Basophils Relative: 1 %
EOS ABS: 0.3 10*3/uL (ref 0.0–0.5)
Eosinophils Relative: 7 %
HCT: 39.9 % (ref 39.0–52.0)
Hemoglobin: 13 g/dL (ref 13.0–17.0)
Immature Granulocytes: 0 %
Lymphocytes Relative: 21 %
Lymphs Abs: 1 10*3/uL (ref 0.7–4.0)
MCH: 29.3 pg (ref 26.0–34.0)
MCHC: 32.6 g/dL (ref 30.0–36.0)
MCV: 90.1 fL (ref 80.0–100.0)
MONOS PCT: 14 %
Monocytes Absolute: 0.7 10*3/uL (ref 0.1–1.0)
Neutro Abs: 2.7 10*3/uL (ref 1.7–7.7)
Neutrophils Relative %: 57 %
Platelets: 163 10*3/uL (ref 150–400)
RBC: 4.43 MIL/uL (ref 4.22–5.81)
RDW: 13 % (ref 11.5–15.5)
WBC: 4.7 10*3/uL (ref 4.0–10.5)
nRBC: 0 % (ref 0.0–0.2)

## 2018-05-25 LAB — COMPREHENSIVE METABOLIC PANEL
ALT: 16 U/L (ref 0–44)
AST: 17 U/L (ref 15–41)
Albumin: 4.1 g/dL (ref 3.5–5.0)
Alkaline Phosphatase: 58 U/L (ref 38–126)
Anion gap: 10 (ref 5–15)
BILIRUBIN TOTAL: 1.5 mg/dL — AB (ref 0.3–1.2)
BUN: 28 mg/dL — ABNORMAL HIGH (ref 8–23)
CO2: 25 mmol/L (ref 22–32)
Calcium: 9 mg/dL (ref 8.9–10.3)
Chloride: 107 mmol/L (ref 98–111)
Creatinine, Ser: 1.62 mg/dL — ABNORMAL HIGH (ref 0.61–1.24)
GFR calc non Af Amer: 40 mL/min — ABNORMAL LOW (ref >60)
GFR, EST AFRICAN AMERICAN: 47 mL/min — AB (ref >60)
Glucose, Bld: 109 mg/dL — ABNORMAL HIGH (ref 70–99)
Potassium: 4.4 mmol/L (ref 3.5–5.1)
Sodium: 142 mmol/L (ref 135–145)
Total Protein: 7 g/dL (ref 6.5–8.1)

## 2018-05-25 LAB — URIC ACID: Uric Acid, Serum: 6.3 mg/dL (ref 3.7–8.6)

## 2018-05-25 LAB — LACTATE DEHYDROGENASE: LDH: 190 U/L (ref 98–192)

## 2018-05-25 MED ORDER — LIDOCAINE-PRILOCAINE 2.5-2.5 % EX CREA: TOPICAL_CREAM | CUTANEOUS | 3 refills | Status: DC

## 2018-05-25 MED ORDER — ONDANSETRON HCL 8 MG PO TABS: 8.0000 mg | ORAL_TABLET | Freq: Three times a day (TID) | ORAL | 1 refills | Status: DC | PRN

## 2018-05-25 MED ORDER — PROCHLORPERAZINE MALEATE 10 MG PO TABS: 10.0000 mg | ORAL_TABLET | Freq: Four times a day (QID) | ORAL | 1 refills | Status: DC | PRN

## 2018-05-25 MED ORDER — ALLOPURINOL 300 MG PO TABS: 300.0000 mg | ORAL_TABLET | Freq: Every day | ORAL | 3 refills | Status: DC

## 2018-05-25 MED ORDER — PREDNISONE 50 MG PO TABS: 50.0000 mg | ORAL_TABLET | Freq: Once | ORAL | 0 refills | Status: AC

## 2018-05-25 MED ORDER — ACYCLOVIR 400 MG PO TABS: 400.0000 mg | ORAL_TABLET | Freq: Every day | ORAL | 3 refills | Status: DC

## 2018-05-25 NOTE — Telephone Encounter (Signed)
Printed avs and calender per los 05/25/2018.

## 2018-05-26 ENCOUNTER — Encounter: Payer: Self-pay | Admitting: Hematology and Oncology

## 2018-05-26 ENCOUNTER — Encounter (HOSPITAL_COMMUNITY): Payer: Self-pay

## 2018-05-26 ENCOUNTER — Ambulatory Visit (HOSPITAL_COMMUNITY)
Admission: RE | Admit: 2018-05-26 | Discharge: 2018-05-26 | Disposition: A | Discharge status: Home / Self Care | Payer: Medicare Other | Source: Ambulatory Visit | Attending: Hematology and Oncology | Admitting: Hematology and Oncology

## 2018-05-26 ENCOUNTER — Telehealth: Payer: Self-pay

## 2018-05-26 DIAGNOSIS — C8318 Mantle cell lymphoma, lymph nodes of multiple sites: Secondary | ICD-10-CM

## 2018-05-26 DIAGNOSIS — C9110 Chronic lymphocytic leukemia of B-cell type not having achieved remission: Secondary | ICD-10-CM | POA: Diagnosis not present

## 2018-05-26 DIAGNOSIS — I712 Thoracic aortic aneurysm, without rupture: Secondary | ICD-10-CM | POA: Insufficient documentation

## 2018-05-26 DIAGNOSIS — C8590 Non-Hodgkin lymphoma, unspecified, unspecified site: Secondary | ICD-10-CM | POA: Diagnosis not present

## 2018-05-26 DIAGNOSIS — C8310 Mantle cell lymphoma, unspecified site: Secondary | ICD-10-CM | POA: Diagnosis not present

## 2018-05-26 DIAGNOSIS — G4733 Obstructive sleep apnea (adult) (pediatric): Secondary | ICD-10-CM | POA: Diagnosis not present

## 2018-05-26 DIAGNOSIS — N400 Enlarged prostate without lower urinary tract symptoms: Secondary | ICD-10-CM | POA: Diagnosis not present

## 2018-05-26 DIAGNOSIS — Z79899 Other long term (current) drug therapy: Secondary | ICD-10-CM | POA: Insufficient documentation

## 2018-05-26 DIAGNOSIS — N179 Acute kidney failure, unspecified: Secondary | ICD-10-CM | POA: Insufficient documentation

## 2018-05-26 DIAGNOSIS — Z881 Allergy status to other antibiotic agents status: Secondary | ICD-10-CM | POA: Diagnosis not present

## 2018-05-26 DIAGNOSIS — I4892 Unspecified atrial flutter: Secondary | ICD-10-CM | POA: Insufficient documentation

## 2018-05-26 DIAGNOSIS — Z888 Allergy status to other drugs, medicaments and biological substances status: Secondary | ICD-10-CM | POA: Diagnosis not present

## 2018-05-26 DIAGNOSIS — E7800 Pure hypercholesterolemia, unspecified: Secondary | ICD-10-CM | POA: Diagnosis not present

## 2018-05-26 DIAGNOSIS — Z886 Allergy status to analgesic agent status: Secondary | ICD-10-CM | POA: Insufficient documentation

## 2018-05-26 DIAGNOSIS — Z8679 Personal history of other diseases of the circulatory system: Secondary | ICD-10-CM | POA: Insufficient documentation

## 2018-05-26 DIAGNOSIS — Z809 Family history of malignant neoplasm, unspecified: Secondary | ICD-10-CM | POA: Insufficient documentation

## 2018-05-26 DIAGNOSIS — Z8249 Family history of ischemic heart disease and other diseases of the circulatory system: Secondary | ICD-10-CM | POA: Insufficient documentation

## 2018-05-26 DIAGNOSIS — Z452 Encounter for adjustment and management of vascular access device: Secondary | ICD-10-CM | POA: Diagnosis not present

## 2018-05-26 HISTORY — PX: IR IMAGING GUIDED PORT INSERTION: IMG5740

## 2018-05-26 LAB — BASIC METABOLIC PANEL
Anion gap: 8 (ref 5–15)
BUN: 29 mg/dL — AB (ref 8–23)
CO2: 22 mmol/L (ref 22–32)
Calcium: 9.1 mg/dL (ref 8.9–10.3)
Chloride: 108 mmol/L (ref 98–111)
Creatinine, Ser: 1.32 mg/dL — ABNORMAL HIGH (ref 0.61–1.24)
GFR calc Af Amer: 60 mL/min — ABNORMAL LOW (ref >60)
GFR calc non Af Amer: 52 mL/min — ABNORMAL LOW (ref >60)
Glucose, Bld: 92 mg/dL (ref 70–99)
Potassium: 4.4 mmol/L (ref 3.5–5.1)
Sodium: 138 mmol/L (ref 135–145)

## 2018-05-26 LAB — CBC
HCT: 40.7 % (ref 39.0–52.0)
Hemoglobin: 13.4 g/dL (ref 13.0–17.0)
MCH: 29.6 pg (ref 26.0–34.0)
MCHC: 32.9 g/dL (ref 30.0–36.0)
MCV: 90 fL (ref 80.0–100.0)
Platelets: 163 10*3/uL (ref 150–400)
RBC: 4.52 MIL/uL (ref 4.22–5.81)
RDW: 12.9 % (ref 11.5–15.5)
WBC: 4.9 10*3/uL (ref 4.0–10.5)
nRBC: 0 % (ref 0.0–0.2)

## 2018-05-26 LAB — PROTIME-INR
INR: 1 (ref 0.8–1.2)
Prothrombin Time: 12.9 seconds (ref 11.4–15.2)

## 2018-05-26 LAB — HEPATITIS B CORE ANTIBODY, IGM: Hep B C IgM: NEGATIVE

## 2018-05-26 LAB — HEPATITIS B SURFACE ANTIGEN: Hepatitis B Surface Ag: NEGATIVE

## 2018-05-26 LAB — HEPATITIS B SURFACE ANTIBODY,QUALITATIVE: Hep B S Ab: NONREACTIVE

## 2018-05-26 MED ORDER — FENTANYL CITRATE (PF) 100 MCG/2ML IJ SOLN
INTRAMUSCULAR | Status: AC
  Filled 2018-05-26: qty 2

## 2018-05-26 MED ORDER — FENTANYL CITRATE (PF) 100 MCG/2ML IJ SOLN
INTRAMUSCULAR | Status: AC | PRN
  Administered 2018-05-26 (×3): 25 ug via INTRAVENOUS
  Administered 2018-05-26: 50 ug via INTRAVENOUS
  Administered 2018-05-26: 25 ug via INTRAVENOUS

## 2018-05-26 MED ORDER — SODIUM CHLORIDE 0.9 % IV SOLN
INTRAVENOUS | Status: AC | PRN
  Administered 2018-05-26: 10 mL/h via INTRAVENOUS

## 2018-05-26 MED ORDER — MIDAZOLAM HCL 2 MG/2ML IJ SOLN
INTRAMUSCULAR | Status: AC
  Filled 2018-05-26: qty 2

## 2018-05-26 MED ORDER — HEPARIN SOD (PORK) LOCK FLUSH 100 UNIT/ML IV SOLN
INTRAVENOUS | Status: AC | PRN
  Administered 2018-05-26: 500 [IU] via INTRAVENOUS

## 2018-05-26 MED ORDER — LIDOCAINE-EPINEPHRINE (PF) 1 %-1:200000 IJ SOLN
INTRAMUSCULAR | Status: AC | PRN
  Administered 2018-05-26: 20 mL via INTRADERMAL

## 2018-05-26 MED ORDER — SODIUM CHLORIDE 0.9 % IV SOLN: INTRAVENOUS | Status: DC

## 2018-05-26 MED ORDER — MIDAZOLAM HCL 2 MG/2ML IJ SOLN
INTRAMUSCULAR | Status: AC | PRN
  Administered 2018-05-26: 0.5 mg via INTRAVENOUS
  Administered 2018-05-26: 1 mg via INTRAVENOUS
  Administered 2018-05-26 (×3): 0.5 mg via INTRAVENOUS

## 2018-05-26 MED ORDER — VANCOMYCIN HCL IN DEXTROSE 1-5 GM/200ML-% IV SOLN
INTRAVENOUS | Status: AC
  Filled 2018-05-26: qty 200

## 2018-05-26 MED ORDER — VANCOMYCIN HCL IN DEXTROSE 1-5 GM/200ML-% IV SOLN
1000.0000 mg | INTRAVENOUS | Status: AC
  Administered 2018-05-26: 1000 mg via INTRAVENOUS

## 2018-05-26 MED ORDER — HEPARIN SOD (PORK) LOCK FLUSH 100 UNIT/ML IV SOLN
INTRAVENOUS | Status: AC
  Filled 2018-05-26: qty 5

## 2018-05-26 MED ORDER — LIDOCAINE-EPINEPHRINE (PF) 1 %-1:200000 IJ SOLN
INTRAMUSCULAR | Status: AC
  Filled 2018-05-26: qty 30

## 2018-05-26 NOTE — Progress Notes (Signed)
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, [name], though not limited to, risk of fatigue, weight loss, tumor lysis syndrome, risk of allergic reactions, pancytopenia, life-threatening infections, need for transfusions of blood products, nausea, vomiting, change in bowel habits, admission to hospital for various reasons, [name], chronic fatigue, [name], [name], [name], [name], Atacand We discussed importance of hydration I plan to recheck serum creatinine again on Friday   Orders Placed This Encounter   Procedures  . CBC with Differential (Cancer Center Only)    Standing Status:   Standing    Number of Occurrences:   20    Standing Expiration Date:   05/25/2019  . CMP ([place] only)    Standing Status:   Standing    Number of Occurrences:   20    Standing Expiration Date:   05/25/2019  . Creatinine (Cancer Center Only)    Standing Status:   Future    Standing Expiration Date:   05/26/2019    INTERVAL HISTORY: Please see below for problem oriented charting. Returns with his wife for chemotherapy consent Since last time I saw him, he feels fine.  No new changes to his lymph nodes  SUMMARY OF ONCOLOGIC HISTORY:   Mantle cell lymphoma ([place])   06/13/2017 Imaging    Ct imaging at [facility] 1. 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 2. 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 3. Slight reduction in size of right retroperitoneal evolving hematoma.    03/30/2018 Imaging    Ct neck 1. Extensive adenopathy on the left. The largest node is a level 1/submandibular node measuring 38 x 24 x 22 mm. Largest level 2 node measures 3.2 x 2 x 2 cm. Numerous other smaller but round lymph nodes throughout the left neck in the level 2 through level 4 region. The largest level 5 node measures 3.4 x 3.4 x 2.3 cm with a transverse diameter of 2.3 cm. This contains some internal calcifications. There  are numerous other pathologic nodes in the left supraclavicular to axillary region. This pattern of disease could be due to extensive left neck metastatic disease from unknown primary, lymphoma, or other systemic malignancy. 2. No evidence of mucosal or submucosal lesion. 3. Diameter of the ascending aorta is 4.5 cm. Recommend annual imaging followup by CTA or MRA. Aortic Atherosclerosis (ICD10-I70.0).     04/10/2018 Pathology Results    Left  zone 1 neck mass, [name], [name], the findings indicate an atypical lymphoid proliferation but are too limited for definitive diagnosis. Excisional biopsy with flow cytometry is recommended.    04/10/2018 Procedure    He was ENT who performed FNA of neck LN    04/27/2018 Pathology Results    Lymph node for lymphoma, [name], [name], [name], [name], [name], [name], [name], the features are consistent with a mantle cell lymphoma    05/13/2018 PET scan    1. Hypermetabolic left cervical lymphadenopathy, left axillary lymphadenopathy, mediastinal / right hilar lymphadenopathy, right external iliac lymphadenopathy, and subcentimeter left external iliac and left inguinal  lymph nodes, [name], [name], [name], without focal lesions, that may represent lymphomatous involvement of the spleen.    05/21/2018 Cancer Staging    Staging form: Hodgkin and Non-Hodgkin Lymphoma, AJCC 8th Edition - Clinical: Stage III - Signed by Heath Lark, MD on 05/21/2018    05/26/2018 Procedure    Ultrasound and fluoroscopically guided right internal jugular single lumen power port catheter insertion. Tip in the SVC/RA junction. Catheter ready for use.      REVIEW OF SYSTEMS:   Constitutional: Denies fevers, chills or abnormal weight loss Eyes: Denies blurriness of vision Ears, nose, mouth, throat, and face: Denies mucositis or sore throat Respiratory: Denies cough, dyspnea or wheezes Cardiovascular: Denies palpitation, chest discomfort or lower extremity swelling Gastrointestinal:  Denies nausea, heartburn or change in bowel habits Skin: Denies abnormal skin rashes Lymphatics: Denies new lymphadenopathy or easy bruising Neurological:Denies numbness, tingling or new weaknesses Behavioral/Psych: Mood is stable, no new changes  All other systems were reviewed with the patient and are negative.  I have reviewed the past medical history, past surgical history, social history and family history with the patient and they are unchanged from previous note.  ALLERGIES:  is allergic to aspirin; levaquin [levofloxacin in d5w]; nickel; statins; and ampicillin.  MEDICATIONS:  Current Outpatient Medications  Medication Sig Dispense Refill  . acetaminophen (  TYLENOL) 500 MG tablet Take 1,000 mg by mouth daily as needed for moderate pain.    Marland Kitchen acyclovir (ZOVIRAX) 400 MG tablet Take 1 tablet (400 mg total) by mouth daily. 30 tablet 3  . alfuzosin (UROXATRAL) 10 MG 24 hr tablet Take 10 mg by mouth at bedtime.     Marland Kitchen allopurinol (ZYLOPRIM) 300 MG tablet Take 1 tablet (300  mg total) by mouth daily. 30 tablet 3  . buPROPion (WELLBUTRIN XL) 150 MG 24 hr tablet Take 1 tablet (150 mg total) by mouth daily. 30 tablet 5  . candesartan (ATACAND) 4 MG tablet Take 4 mg by mouth at bedtime.     . Cholecalciferol (VITAMIN D) 2000 units tablet Take 2,000 Units by mouth at bedtime.     Marland Kitchen ezetimibe (ZETIA) 10 MG tablet Take 10 mg by mouth at bedtime.     . finasteride (PROSCAR) 5 MG tablet Take 5 mg by mouth daily.     Marland Kitchen lidocaine-prilocaine (EMLA) cream Apply to affected area once 30 g 3  . metoprolol succinate (TOPROL-XL) 25 MG 24 hr tablet Take 12.5 mg by mouth daily.     . ondansetron (ZOFRAN) 8 MG tablet Take 1 tablet (8 mg total) by mouth every 8 (eight) hours as needed for refractory nausea / vomiting. 30 tablet 1  . OVER THE COUNTER MEDICATION Apply 1 application topically as needed. Uses lotion with CBD in it for joints     . Polyethyl Glycol-Propyl Glycol (SYSTANE OP) Place 1 drop into both eyes 2 (two) times daily.    . prochlorperazine (COMPAZINE) 10 MG tablet Take 1 tablet (10 mg total) by mouth every 6 (six) hours as needed (Nausea or vomiting). 30 tablet 1   No current facility-administered medications for this visit.    Facility-Administered Medications Ordered in Other Visits  Medication Dose Route Frequency Provider Last Rate Last Dose  . 0.9 %  sodium chloride infusion   Intravenous Continuous Monia Sabal, PA-C      . fentaNYL (SUBLIMAZE) 100 MCG/2ML injection           . fentaNYL (SUBLIMAZE) 100 MCG/2ML injection           . heparin lock flush 100 UNIT/ML injection           . lidocaine-EPINEPHrine (XYLOCAINE-EPINEPHrine) 1 %-1:200000 (PF) injection           . midazolam (VERSED) 2 MG/2ML injection           . midazolam (VERSED) 2 MG/2ML injection           . vancomycin (VANCOCIN) 1-5 GM/200ML-% IVPB             PHYSICAL EXAMINATION: ECOG PERFORMANCE STATUS: 1 - Symptomatic but completely ambulatory  Vitals:   05/25/18 1428  BP: (!) 122/58   Pulse: 65  Resp: 18  Temp: (!) 97.5 F (36.4 C)  SpO2: 97%   Filed Weights   05/25/18 1428  Weight: 218 lb 12.8 oz (99.2 kg)    GENERAL:alert, no distress and comfortable NEURO: alert & oriented x 3 with fluent speech, no focal motor/sensory deficits  LABORATORY DATA:  I have reviewed the data as listed    Component Value Date/Time   NA 138 05/26/2018 0755   K 4.4 05/26/2018 0755   CL 108 05/26/2018 0755   CO2 22 05/26/2018 0755   GLUCOSE 92 05/26/2018 0755   BUN 29 (H) 05/26/2018 0755   CREATININE 1.32 (H) 05/26/2018 0755   CALCIUM 9.1 05/26/2018  0755   PROT 7.0 05/25/2018 1408   PROT 6.6 07/23/2017 1504   ALBUMIN 4.1 05/25/2018 1408   AST 17 05/25/2018 1408   ALT 16 05/25/2018 1408   ALKPHOS 58 05/25/2018 1408   BILITOT 1.5 (H) 05/25/2018 1408   GFRNONAA 52 (L) 05/26/2018 0755   GFRAA 60 (L) 05/26/2018 0755    No results found for: SPEP, UPEP  Lab Results  Component Value Date   WBC 4.9 05/26/2018   NEUTROABS 2.7 05/25/2018   HGB 13.4 05/26/2018   HCT 40.7 05/26/2018   MCV 90.0 05/26/2018   PLT 163 05/26/2018      Chemistry      Component Value Date/Time   NA 138 05/26/2018 0755   K 4.4 05/26/2018 0755   CL 108 05/26/2018 0755   CO2 22 05/26/2018 0755   BUN 29 (H) 05/26/2018 0755   CREATININE 1.32 (H) 05/26/2018 0755      Component Value Date/Time   CALCIUM 9.1 05/26/2018 0755   ALKPHOS 58 05/25/2018 1408   AST 17 05/25/2018 1408   ALT 16 05/25/2018 1408   BILITOT 1.5 (H) 05/25/2018 1408       RADIOGRAPHIC STUDIES: I have personally reviewed the radiological images as listed and agreed with the findings in the report. Ir Imaging Guided Port Insertion  Result Date: 05/26/2018 CLINICAL DATA:  MANTLE CELL LYMPHOMA EXAM: RIGHT IJ SINGLE LUMEN POWER PORT CATHETER INSERTION Date:  05/26/2018 05/26/2018 11:13 am Radiologist:  M. Daryll Brod, MD Guidance:  Ultrasound and fluoroscopic MEDICATIONS: 1 g vancomycin; The antibiotic was administered within  an appropriate time interval prior to skin puncture. ANESTHESIA/SEDATION: Versed 3.0 mg IV; Fentanyl 150 mcg IV; Moderate Sedation Time:  26 minutes The patient was continuously monitored during the procedure by the interventional radiology nurse under my direct supervision. FLUOROSCOPY TIME:  0 minutes, 36 seconds (3 mGy) COMPLICATIONS: None immediate. CONTRAST:  None. PROCEDURE: Informed consent was obtained from the patient following explanation of the procedure, risks, benefits and alternatives. The patient understands, agrees and consents for the procedure. All questions were addressed. A time out was performed. Maximal barrier sterile technique utilized including caps, mask, sterile gowns, sterile gloves, large sterile drape, hand hygiene, and 2% chlorhexidine scrub. Under sterile conditions and local anesthesia, right internal jugular micropuncture venous access was performed. Access was performed with ultrasound. Images were obtained for documentation of the patent right internal jugular vein. A guide wire was inserted followed by a transitional dilator. This allowed insertion of a guide wire and catheter into the IVC. Measurements were obtained from the SVC / RA junction back to the right IJ venotomy site. In the right infraclavicular chest, a subcutaneous pocket was created over the second anterior rib. This was done under sterile conditions and local anesthesia. 1% lidocaine with epinephrine was utilized for this. A 2.5 cm incision was made in the skin. Blunt dissection was performed to create a subcutaneous pocket over the right pectoralis major muscle. The pocket was flushed with saline vigorously. There was adequate hemostasis. The port catheter was assembled and checked for leakage. The port catheter was secured in the pocket with two retention sutures. The tubing was tunneled subcutaneously to the right venotomy site and inserted into the SVC/RA junction through a valved peel-away sheath. Position  was confirmed with fluoroscopy. Images were obtained for documentation. The patient tolerated the procedure well. No immediate complications. Incisions were closed in a two layer fashion with 4 - 0 Vicryl suture. Dermabond was applied to the skin.  The port catheter was accessed, blood was aspirated followed by saline and heparin flushes. Needle was removed. A dry sterile dressing was applied. IMPRESSION: Ultrasound and fluoroscopically guided right internal jugular single lumen power port catheter insertion. Tip in the SVC/RA junction. Catheter ready for use. Electronically Signed   By: Jerilynn Mages.  Shick M.D.   On: 05/26/2018 11:19    All questions were answered. The patient knows to call the clinic with any problems, questions or concerns. No barriers to learning was detected.  I spent 25 minutes counseling the patient face to face. The total time spent in the appointment was 30 minutes and more than 50% was on counseling and review of test results  Heath Lark, MD 05/26/2018 3:33 PM

## 2018-05-26 NOTE — Telephone Encounter (Signed)
Called and spoke with wife. She has the disc and will bring Friday when they come for lab appt.

## 2018-05-26 NOTE — Assessment & Plan Note (Signed)
He has acute renal failure of unknown etiology I recommend holding his blood pressure medicine, Atacand We discussed importance of hydration I plan to recheck serum creatinine again on Friday

## 2018-05-26 NOTE — Discharge Instructions (Addendum)
Implanted Port Insertion, Care After °This sheet gives you information about how to care for yourself after your procedure. Your health care provider may also give you more specific instructions. If you have problems or questions, contact your health care provider. °What can I expect after the procedure? °After the procedure, it is common to have: °· Discomfort at the port insertion site. °· Bruising on the skin over the port. This should improve over 3-4 days. °Follow these instructions at home: °Port care °· After your port is placed, you will get a manufacturer's information card. The card has information about your port. Keep this card with you at all times. °· Take care of the port as told by your health care provider. Ask your health care provider if you or a family member can get training for taking care of the port at home. A home health care nurse may also take care of the port. °· Make sure to remember what type of port you have. °Incision care ° °  ° °· Follow instructions from your health care provider about how to take care of your port insertion site. Make sure you: °? Wash your hands with soap and water before and after you change your bandage (dressing). If soap and water are not available, use hand sanitizer. °? Change your dressing as told by your health care provider. °? Leave stitches (sutures), skin glue, or adhesive strips in place. These skin closures may need to stay in place for 2 weeks or longer. If adhesive strip edges start to loosen and curl up, you may trim the loose edges. Do not remove adhesive strips completely unless your health care provider tells you to do that. °· Check your port insertion site every day for signs of infection. Check for: °? Redness, swelling, or pain. °? Fluid or blood. °? Warmth. °? Pus or a bad smell. °Activity °· Return to your normal activities as told by your health care provider. Ask your health care provider what activities are safe for you. °· Do not  lift anything that is heavier than 10 lb (4.5 kg), or the limit that you are told, until your health care provider says that it is safe. °General instructions °· Take over-the-counter and prescription medicines only as told by your health care provider. °· Do not take baths, swim, or use a hot tub until your health care provider approves. Ask your health care provider if you may take showers. You may only be allowed to take sponge baths. °· Do not drive for 24 hours if you were given a sedative during your procedure. °· Wear a medical alert bracelet in case of an emergency. This will tell any health care providers that you have a port. °· Keep all follow-up visits as told by your health care provider. This is important. °Contact a health care provider if: °· You cannot flush your port with saline as directed, or you cannot draw blood from the port. °· You have a fever or chills. °· You have redness, swelling, or pain around your port insertion site. °· You have fluid or blood coming from your port insertion site. °· Your port insertion site feels warm to the touch. °· You have pus or a bad smell coming from the port insertion site. °Get help right away if: °· You have chest pain or shortness of breath. °· You have bleeding from your port that you cannot control. °Summary °· Take care of the port as told by your health   care provider. Keep the manufacturer's information card with you at all times. °· Change your dressing as told by your health care provider. °· Contact a health care provider if you have a fever or chills or if you have redness, swelling, or pain around your port insertion site. °· Keep all follow-up visits as told by your health care provider. °This information is not intended to replace advice given to you by your health care provider. Make sure you discuss any questions you have with your health care provider. °Document Released: 12/30/2012 Document Revised: 10/07/2017 Document Reviewed:  10/07/2017 °Elsevier Interactive Patient Education © 2019 [employer] ° °Moderate Conscious Sedation, Adult, Care After °These instructions provide you with information about caring for yourself after your procedure. Your health care provider may also give you more specific instructions. Your treatment has been planned according to current medical practices, but problems sometimes occur. Call your health care provider if you have any problems or questions after your procedure. °What can I expect after the procedure? °After your procedure, it is common: °· To feel sleepy for several hours. °· To feel clumsy and have poor balance for several hours. °· To have poor judgment for several hours. °· To vomit if you eat too soon. °Follow these instructions at home: °For at least 24 hours after the procedure: ° °· Do not: °? Participate in activities where you could fall or become injured. °? Drive. °? Use heavy machinery. °? Drink alcohol. °? Take sleeping pills or medicines that cause drowsiness. °? Make important decisions or sign legal documents. °? Take care of children on your own. °· Rest. °Eating and drinking °· Follow the diet recommended by your health care provider. °· If you vomit: °? Drink water, juice, or soup when you can drink without vomiting. °? Make sure you have little or no nausea before eating solid foods. °General instructions °· Have a responsible adult stay with you until you are awake and alert. °· Take over-the-counter and prescription medicines only as told by your health care provider. °· If you smoke, do not smoke without supervision. °· Keep all follow-up visits as told by your health care provider. This is important. °Contact a health care provider if: °· You keep feeling nauseous or you keep vomiting. °· You feel light-headed. °· You develop a rash. °· You have a fever. °Get help right away if: °· You have trouble breathing. °This information is not intended to replace advice given to you  by your health care provider. Make sure you discuss any questions you have with your health care provider. °Document Released: 12/30/2012 Document Revised: 08/14/2015 Document Reviewed: 07/01/2015 °Elsevier Interactive Patient Education © 2019 [employer] ° °

## 2018-05-26 NOTE — H&P (Signed)
Chief Complaint: Patient was seen in consultation today for [facility] a cath placement at the request of [place]  Referring Physician(s): Heath Lark  Supervising Physician: Daryll Brod  Patient Status: [facility] - Out-pt  History of Present Illness: Sean Stanley is a 78 y.o. male   Lymphoma Chronic Lymphocytic Leukemia Follows with Dr Mellody Dance a cath placement today    Past Medical History:  Diagnosis Date  . 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 Atrial flutter ([place])   . BPH (benign prostatic hyperplasia)   . Cancer ([place])    melanoma on lower right back and left chest - surgically removed and cleared  . 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 Headache   . 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 Hypercholesteremia   . Hypercholesterolemia   . Mitral valve disease    annuloplasty 2002 [facility]  . Neuropathy   . Neuropathy of both feet    pt states due to exposure to [employer]  . OSA (obstructive sleep apnea)    does not use cpap, Dr. Maxwell Caul told him he had improved  . Pneumonia   . Thoracic ascending aortic aneurysm (HCC)    4.5 cm 03/2018 CT    Past Surgical History:  Procedure Laterality Date  . ABDOMINAL ANGIOGRAM  08/05/12  . aneurysm repair    . CARDIOVERSION  02/12/2012   Procedure: CARDIOVERSION;  Surgeon: Pixie Casino, MD;  Location: [facility] ENDOSCOPY;  Service: Cardiovascular;  Laterality: N/A;  . CARDIOVERSION N/A 06/22/2013   Procedure: CARDIOVERSION;  Surgeon: [name], MD;  Location: [place];  Service: Cardiovascular;  Laterality:  N/A;  . CATARACT EXTRACTION Bilateral 2018   with lens implant  . CHOLECYSTECTOMY    . COLONOSCOPY    . LYMPH NODE BIOPSY Left 04/27/2018   Procedure: EXCISIONAL BIOPSY OF LEFT CERVICAL LYMPH NODE;  Surgeon: Melida Quitter, MD;  Location: [place];  Service: ENT;  Laterality: Left;  . MENISCUS REPAIR Right 2009  . MITRAL VALVE REPAIR  2002   [facility]  . NM MYOVIEW LTD  07/22/2006   no ischemia  . RIGHT HEART CATH  06/19/2004   normal right heart dynamics. EF 50%  . TEE WITHOUT CARDIOVERSION  02/12/2012   Procedure: TRANSESOPHAGEAL ECHOCARDIOGRAM (TEE);  Surgeon: Pixie Casino, MD;  Location: [facility] ENDOSCOPY;  Service: Cardiovascular;  Laterality: N/A;  . TEE WITHOUT CARDIOVERSION N/A 06/22/2013   Procedure: TRANSESOPHAGEAL ECHOCARDIOGRAM (TEE);  Surgeon: [name], MD;  Location: [facility] ENDOSCOPY;  Service: Cardiovascular;  Laterality: N/A;    Allergies: Aspirin; Levaquin [levofloxacin in d5w]; Nickel; Statins; and Ampicillin  Medications: Prior to Admission medications   Medication Sig Start Date End Date Taking? Authorizing Provider  acetaminophen (TYLENOL) 500 MG tablet Take 1,000 mg by mouth daily as needed for moderate pain.   Yes [provider]  alfuzosin (UROXATRAL) 10 MG 24 hr tablet Take 10 mg by mouth at bedtime.    Yes [provider]  buPROPion (WELLBUTRIN XL) 150 MG 24 hr tablet Take 1 tablet (  150 mg total) by mouth daily. 04/30/18  Yes Dohmeier, Asencion Partridge, MD  candesartan (ATACAND) 4 MG tablet Take 4 mg by mouth at bedtime.  05/30/17  Yes [provider]  Cholecalciferol (VITAMIN D) 2000 units tablet Take 2,000 Units by mouth at bedtime.    Yes [provider]  ezetimibe (ZETIA) 10 MG tablet Take 10 mg by mouth at bedtime.    Yes [provider]  finasteride (PROSCAR) 5 MG tablet Take 5 mg by mouth daily.  09/23/14  Yes [provider]  metoprolol succinate (TOPROL-XL) 25 MG 24 hr tablet Take 12.5 mg by mouth daily.  06/11/16  Yes  [provider]  OVER THE COUNTER MEDICATION Apply 1 application topically as needed. Uses lotion with CBD in it for joints    Yes [provider]  Polyethyl Glycol-Propyl Glycol (SYSTANE OP) Place 1 drop into both eyes 2 (two) times daily.   Yes [provider]  acyclovir (ZOVIRAX) 400 MG tablet Take 1 tablet (400 mg total) by mouth daily. 05/25/18   Heath Lark, MD  allopurinol (ZYLOPRIM) 300 MG tablet Take 1 tablet (300 mg total) by mouth daily. 05/25/18   Heath Lark, MD  lidocaine-prilocaine (EMLA) cream Apply to affected area once 05/25/18   Heath Lark, MD  ondansetron (ZOFRAN) 8 MG tablet Take 1 tablet (8 mg total) by mouth every 8 (eight) hours as needed for refractory nausea / vomiting. 05/25/18   Heath Lark, MD  prochlorperazine (COMPAZINE) 10 MG tablet Take 1 tablet (10 mg total) by mouth every 6 (six) hours as needed (Nausea or vomiting). 05/25/18   Heath Lark, MD     Family History  Problem Relation Age of Onset  . Parkinson's disease Mother 75  . Heart failure Father 45  . Cancer Maternal Grandmother   . Heart attack Maternal Grandfather   . Heart attack Paternal Grandmother   . Heart attack Paternal Grandfather     Social History   Socioeconomic History  . Marital status: Married    Spouse name: [name]  . Number of children: 2  . Years of education: Not on file  . Highest education level: Not on file  Occupational History  . Occupation: retired Hydrologist  Social Needs  . Financial resource strain: Not on file  . Food insecurity:    Worry: Not on file    Inability: Not on file  . Transportation needs:    Medical: Not on file    Non-medical: Not on file  Tobacco Use  . Smoking status: Never Smoker  . Smokeless tobacco: Never Used  Substance and Sexual Activity  . Alcohol use: Yes    Comment: social  . Drug use: No  . Sexual activity: Not on file  Lifestyle  . Physical activity:    Days per week: Not on file    Minutes per session: Not on file   . Stress: Not on file  Relationships  . Social connections:    Talks on phone: Not on file    Gets together: Not on file    Attends religious service: Not on file    Active member of club or organization: Not on file    Attends meetings of clubs or organizations: Not on file    Relationship status: Not on file  Other Topics Concern  . Not on file  Social History Narrative   Lives in [place], Retired    Review of Systems: A 12 point ROS discussed and pertinent positives are indicated  in the HPI above.  All other systems are negative.  Review of Systems  Constitutional: Positive for fatigue. Negative for activity change and fever.  Respiratory: Negative for cough.   Cardiovascular: Negative for chest pain.  Neurological: Negative for weakness.  Psychiatric/Behavioral: Negative for behavioral problems and confusion.    Vital Signs: BP 121/80   Pulse 63   Temp (!) 97.4 F (36.3 C)   Ht 6\' 1"  (1.854 m)   Wt 215 lb (97.5 kg)   SpO2 96%   BMI 28.37 kg/m   Physical Exam Cardiovascular:     Rate and Rhythm: Normal rate and regular rhythm.     Heart sounds: Normal heart sounds.  Pulmonary:     Effort: Pulmonary effort is normal.     Breath sounds: Normal breath sounds.  Abdominal:     General: Bowel sounds are normal.  Musculoskeletal: Normal range of motion.  Skin:    General: Skin is warm and dry.  Neurological:     Mental Status: He is alert and oriented to person, place, and time.  Psychiatric:        Mood and Affect: Mood normal.        Behavior: Behavior normal.        Thought Content: Thought content normal.        Judgment: Judgment normal.     Imaging: No results found.  Labs:  CBC: Recent Labs    04/27/18 0735 05/25/18 1408 05/26/18 0755  WBC 4.9 4.7 4.9  HGB 13.4 13.0 13.4  HCT 41.6 39.9 40.7  PLT 158 163 163    COAGS: Recent Labs    05/26/18 0755  INR 1.0    BMP: Recent Labs    04/27/18 0735 05/25/18 1408 05/26/18 0755  NA  141 142 138  K 4.0 4.4 4.4  CL 109 107 108  CO2 22 25 22   GLUCOSE 98 109* 92  BUN 26* 28* 29*  CALCIUM 8.8* 9.0 9.1  CREATININE 1.15 1.62* 1.32*  GFRNONAA >60 40* 52*  GFRAA >60 47* 60*    LIVER FUNCTION TESTS: Recent Labs    07/23/17 1504 05/25/18 1408  BILITOT  --  1.5*  AST  --  17  ALT  --  16  ALKPHOS  --  58  PROT 6.6 7.0  ALBUMIN  --  4.1    TUMOR MARKERS: No results for input(s): AFPTM, CEA, CA199, CHROMGRNA in the last 8760 hours.  Assessment and Plan:  Lymphoma and CLL Scheduled to start treatments next week Scheduled for PAC placement in IR Risks and benefits of image guided port-a-catheter placement was discussed with the patient including, but not limited to bleeding, infection, pneumothorax, or fibrin sheath development and need for additional procedures.  All of the patient's questions were answered, patient is agreeable to proceed. Consent signed and in chart.   Thank you for this interesting consult.  I greatly enjoyed meeting Shia A Mirante and look forward to participating in their care.  A copy of this report was sent to the requesting provider on this date.  Electronically Signed: Lavonia Drafts, PA-C 05/26/2018, 8:50 AM   I spent a total of  30 Minutes   in face to face in clinical consultation, greater than 50% of which was counseling/coordinating care for [facility] placement

## 2018-05-26 NOTE — Assessment & Plan Note (Addendum)
We discussed the role of chemotherapy is of curative intent The chemotherapy consists of   1. Bendamustine at 90 mg/m2 on day 1 & 2 2. Rituximab at 375 mg/m2 on day 1 Each cycle + 28 days  Plan for 6 cycles total with or without Neulasta support  We discussed some of the risks, [name], though not limited to, risk of fatigue, weight loss, tumor lysis syndrome, risk of allergic reactions, pancytopenia, life-threatening infections, need for transfusions of blood products, nausea, vomiting, change in bowel habits, admission to hospital for various reasons, [name], chronic fatigue, [name], [name], [name], I recommend 1 dose of prednisone the day before chemotherapy.

## 2018-05-26 NOTE — Procedures (Signed)
LYMPHOMA  S/P RT IJ POWER PORT TIP SVCRA NO COMP STABLE EBL 0 READY FOR USE FULL REPORT IN PACS

## 2018-05-26 NOTE — Telephone Encounter (Signed)
-----   Message from Heath Lark, MD sent at 05/26/2018  3:30 PM EST ----- Regarding: copy of PET from [place] His wife Sean Stanley said they are planning to go to [facility] on Friday Can they request a copy of PET so we can use it for future comparison?

## 2018-05-29 ENCOUNTER — Telehealth: Payer: Self-pay | Admitting: *Deleted

## 2018-05-29 ENCOUNTER — Other Ambulatory Visit (HOSPITAL_COMMUNITY): Payer: Self-pay | Admitting: Hematology and Oncology

## 2018-05-29 ENCOUNTER — Inpatient Hospital Stay: Payer: Medicare Other

## 2018-05-29 ENCOUNTER — Other Ambulatory Visit: Payer: Self-pay | Admitting: Hematology and Oncology

## 2018-05-29 ENCOUNTER — Other Ambulatory Visit (HOSPITAL_COMMUNITY): Payer: Medicare Other

## 2018-05-29 ENCOUNTER — Inpatient Hospital Stay
Admission: RE | Admit: 2018-05-29 | Discharge: 2018-05-29 | Disposition: A | Discharge status: Home / Self Care | Payer: Self-pay | Source: Ambulatory Visit | Attending: Hematology and Oncology | Admitting: Hematology and Oncology

## 2018-05-29 DIAGNOSIS — C801 Malignant (primary) neoplasm, unspecified: Secondary | ICD-10-CM

## 2018-05-29 DIAGNOSIS — N179 Acute kidney failure, unspecified: Secondary | ICD-10-CM | POA: Diagnosis not present

## 2018-05-29 DIAGNOSIS — C8318 Mantle cell lymphoma, lymph nodes of multiple sites: Secondary | ICD-10-CM

## 2018-05-29 DIAGNOSIS — Z5111 Encounter for antineoplastic chemotherapy: Secondary | ICD-10-CM | POA: Diagnosis not present

## 2018-05-29 DIAGNOSIS — G629 Polyneuropathy, unspecified: Secondary | ICD-10-CM | POA: Diagnosis not present

## 2018-05-29 DIAGNOSIS — E860 Dehydration: Secondary | ICD-10-CM | POA: Diagnosis not present

## 2018-05-29 DIAGNOSIS — Z112 Encounter for screening for other bacterial diseases: Secondary | ICD-10-CM | POA: Diagnosis not present

## 2018-05-29 LAB — CREATININE, SERUM
CREATININE: 1.35 mg/dL — AB (ref 0.61–1.24)
GFR calc Af Amer: 58 mL/min — ABNORMAL LOW (ref >60)
GFR calc non Af Amer: 50 mL/min — ABNORMAL LOW (ref >60)

## 2018-06-01 ENCOUNTER — Inpatient Hospital Stay: Payer: Medicare Other

## 2018-06-01 VITALS — BP 115/66 | HR 68 | Temp 97.8°F | Resp 18 | Wt 222.0 lb

## 2018-06-01 DIAGNOSIS — E860 Dehydration: Secondary | ICD-10-CM | POA: Diagnosis not present

## 2018-06-01 DIAGNOSIS — G629 Polyneuropathy, unspecified: Secondary | ICD-10-CM | POA: Diagnosis not present

## 2018-06-01 DIAGNOSIS — N179 Acute kidney failure, unspecified: Secondary | ICD-10-CM | POA: Diagnosis not present

## 2018-06-01 DIAGNOSIS — Z5111 Encounter for antineoplastic chemotherapy: Secondary | ICD-10-CM | POA: Diagnosis not present

## 2018-06-01 DIAGNOSIS — C8318 Mantle cell lymphoma, lymph nodes of multiple sites: Secondary | ICD-10-CM | POA: Diagnosis not present

## 2018-06-01 DIAGNOSIS — Z112 Encounter for screening for other bacterial diseases: Secondary | ICD-10-CM | POA: Diagnosis not present

## 2018-06-01 MED ORDER — SODIUM CHLORIDE 0.9 % IV SOLN
90.0000 mg/m2 | Freq: Once | INTRAVENOUS | Status: AC
  Administered 2018-06-01: 200 mg via INTRAVENOUS
  Filled 2018-06-01: qty 8

## 2018-06-01 MED ORDER — SODIUM CHLORIDE 0.9 % IV SOLN
375.0000 mg/m2 | Freq: Once | INTRAVENOUS | Status: AC
  Administered 2018-06-01: 900 mg via INTRAVENOUS
  Filled 2018-06-01: qty 40

## 2018-06-01 MED ORDER — SODIUM CHLORIDE 0.9% FLUSH
10.0000 mL | INTRAVENOUS | Status: DC | PRN
  Administered 2018-06-01: 10 mL
  Filled 2018-06-01: qty 10

## 2018-06-01 MED ORDER — ACETAMINOPHEN 325 MG PO TABS
650.0000 mg | ORAL_TABLET | Freq: Once | ORAL | Status: AC
  Administered 2018-06-01: 650 mg via ORAL

## 2018-06-01 MED ORDER — ACETAMINOPHEN 325 MG PO TABS
ORAL_TABLET | ORAL | Status: AC
  Filled 2018-06-01: qty 2

## 2018-06-01 MED ORDER — DIPHENHYDRAMINE HCL 25 MG PO CAPS
50.0000 mg | ORAL_CAPSULE | Freq: Once | ORAL | Status: AC
  Administered 2018-06-01: 50 mg via ORAL

## 2018-06-01 MED ORDER — PALONOSETRON HCL INJECTION 0.25 MG/5ML
INTRAVENOUS | Status: AC
  Filled 2018-06-01: qty 5

## 2018-06-01 MED ORDER — PALONOSETRON HCL INJECTION 0.25 MG/5ML
0.2500 mg | Freq: Once | INTRAVENOUS | Status: AC
  Administered 2018-06-01: 0.25 mg via INTRAVENOUS

## 2018-06-01 MED ORDER — HEPARIN SOD (PORK) LOCK FLUSH 100 UNIT/ML IV SOLN
500.0000 [IU] | Freq: Once | INTRAVENOUS | Status: AC | PRN
  Administered 2018-06-01: 500 [IU]
  Filled 2018-06-01: qty 5

## 2018-06-01 MED ORDER — DEXAMETHASONE SODIUM PHOSPHATE 10 MG/ML IJ SOLN
INTRAMUSCULAR | Status: AC
  Filled 2018-06-01: qty 1

## 2018-06-01 MED ORDER — SODIUM CHLORIDE 0.9 % IV SOLN
Freq: Once | INTRAVENOUS | Status: AC
  Administered 2018-06-01: 08:00:00 via INTRAVENOUS
  Filled 2018-06-01: qty 250

## 2018-06-01 MED ORDER — DIPHENHYDRAMINE HCL 25 MG PO CAPS
ORAL_CAPSULE | ORAL | Status: AC
  Filled 2018-06-01: qty 2

## 2018-06-01 MED ORDER — DEXAMETHASONE SODIUM PHOSPHATE 10 MG/ML IJ SOLN
10.0000 mg | Freq: Once | INTRAMUSCULAR | Status: AC
  Administered 2018-06-01: 10 mg via INTRAVENOUS

## 2018-06-01 NOTE — Patient Instructions (Signed)
[facility] Discharge Instructions for Patients Receiving Chemotherapy  Today you received the following chemotherapy agents Rituxan and Bendeka  To help prevent nausea and vomiting after your treatment, we encourage you to take your nausea medication as directed.  If you develop nausea and vomiting that is not controlled by your nausea medication, call the clinic.   BELOW ARE SYMPTOMS THAT SHOULD BE REPORTED IMMEDIATELY:  *FEVER GREATER THAN 100.5 F  *CHILLS WITH OR WITHOUT FEVER  NAUSEA AND VOMITING THAT IS NOT CONTROLLED WITH YOUR NAUSEA MEDICATION  *UNUSUAL SHORTNESS OF BREATH  *UNUSUAL BRUISING OR BLEEDING  TENDERNESS IN MOUTH AND THROAT WITH OR WITHOUT PRESENCE OF ULCERS  *URINARY PROBLEMS  *BOWEL PROBLEMS  UNUSUAL RASH Items with * indicate a potential emergency and should be followed up as soon as possible.  Feel free to call the clinic should you have any questions or concerns. The clinic phone number is [phone].  Please show the CHEMO ALERT CARD at check-in to the Emergency Department and triage nurse.  Rituximab injection What is this medicine? RITUXIMAB (ri TUX i mab) is a monoclonal antibody. It is used to treat certain types of cancer like non-Hodgkin lymphoma and chronic lymphocytic leukemia. It is also used to treat rheumatoid arthritis, granulomatosis with polyangiitis (or Wegener's granulomatosis), microscopic polyangiitis, and pemphigus vulgaris. This medicine may be used for other purposes; ask your health care provider or pharmacist if you have questions. COMMON BRAND NAME(S): Rituxan What should I tell my health care provider before I take this medicine? They need to know if you have any of these conditions: -heart disease -infection (especially a virus infection such as hepatitis B, chickenpox, cold sores, or herpes) -immune system problems -irregular heartbeat -kidney disease -low blood counts, like low white cell, platelet,  or red cell counts -lung or breathing disease, like asthma -recently received or scheduled to receive a vaccine -an unusual or allergic reaction to rituximab, other medicines, foods, dyes, or preservatives -pregnant or trying to get pregnant -breast-feeding How should I use this medicine? This medicine is for infusion into a vein. It is administered in a hospital or clinic by a specially trained health care professional. A special MedGuide will be given to you by the pharmacist with each prescription and refill. Be sure to read this information carefully each time. Talk to your pediatrician regarding the use of this medicine in children. This medicine is not approved for use in children. Overdosage: If you think you have taken too much of this medicine contact a poison control center or emergency room at once. NOTE: This medicine is only for you. Do not share this medicine with others. What if I miss a dose? It is important not to miss a dose. Call your doctor or health care professional if you are unable to keep an appointment. What may interact with this medicine? -cisplatin -live virus vaccines This list may not describe all possible interactions. Give your health care provider a list of all the medicines, herbs, non-prescription drugs, or dietary supplements you use. Also tell them if you smoke, drink alcohol, or use illegal drugs. Some items may interact with your medicine. What should I watch for while using this medicine? Your condition will be monitored carefully while you are receiving this medicine. You may need blood work done while you are taking this medicine. This medicine can cause serious allergic reactions. To reduce your risk you may need to take medicine before treatment with this medicine. Take your medicine as   directed. In some patients, this medicine may cause a serious brain infection that may cause death. If you have any problems seeing, thinking, speaking, walking, or  standing, tell your healthcare professional right away. If you cannot reach your healthcare professional, urgently seek other source of medical care. Call your doctor or health care professional for advice if you get a fever, chills or sore throat, or other symptoms of a cold or flu. Do not treat yourself. This drug decreases your body's ability to fight infections. Try to avoid being around people who are sick. Do not become pregnant while taking this medicine or for 12 months after stopping it. Women should inform their doctor if they wish to become pregnant or think they might be pregnant. There is a potential for serious side effects to an unborn child. Talk to your health care professional or pharmacist for more information. Do not breast-feed an infant while taking this medicine or for 6 months after stopping it. What side effects may I notice from receiving this medicine? Side effects that you should report to your doctor or health care professional as soon as possible: -allergic reactions like skin rash, itching or hives; swelling of the face, lips, or tongue -breathing problems -chest pain -changes in vision -diarrhea -headache with fever, neck stiffness, sensitivity to light, nausea, or confusion -fast, irregular heartbeat -loss of memory -low blood counts - this medicine may decrease the number of white blood cells, red blood cells and platelets. You may be at increased risk for infections and bleeding. -mouth sores -problems with balance, talking, or walking -redness, blistering, peeling or loosening of the skin, including inside the mouth -signs of infection - fever or chills, cough, sore throat, pain or difficulty passing urine -signs and symptoms of kidney injury like trouble passing urine or change in the amount of urine -signs and symptoms of liver injury like dark yellow or brown urine; general ill feeling or flu-like symptoms; light-colored stools; loss of appetite; nausea;  right upper belly pain; unusually weak or tired; yellowing of the eyes or skin -signs and symptoms of low blood pressure like dizziness; feeling faint or lightheaded, falls; unusually weak or tired -stomach pain -swelling of the ankles, feet, hands -unusual bleeding or bruising -vomiting Side effects that usually do not require medical attention (report to your doctor or health care professional if they continue or are bothersome): -headache -joint pain -muscle cramps or muscle pain -nausea -tiredness This list may not describe all possible side effects. Call your doctor for medical advice about side effects. You may report side effects to FDA at 1-800-FDA-1088. Where should I keep my medicine? This drug is given in a hospital or clinic and will not be stored at home. NOTE: This sheet is a summary. It may not cover all possible information. If you have questions about this medicine, talk to your doctor, pharmacist, or health care provider.  2019 Elsevier/Gold Standard (2017-02-21 13:04:32)  Bendamustine Injection What is this medicine? BENDAMUSTINE (BEN da MUS teen) is a chemotherapy drug. It is used to treat chronic lymphocytic leukemia and non-Hodgkin lymphoma. This medicine may be used for other purposes; ask your health care provider or pharmacist if you have questions. COMMON BRAND NAME(S): BENDEKA, Treanda What should I tell my health care provider before I take this medicine? They need to know if you have any of these conditions: -infection (especially a virus infection such as chickenpox, cold sores, or herpes) -kidney disease -liver disease -an unusual or allergic reaction to bendamustine,   mannitol, other medicines, foods, dyes, or preservatives -pregnant or trying to get pregnant -breast-feeding How should I use this medicine? This medicine is for infusion into a vein. It is given by a health care professional in a hospital or clinic setting. Talk to your pediatrician  regarding the use of this medicine in children. Special care may be needed. Overdosage: If you think you have taken too much of this medicine contact a poison control center or emergency room at once. NOTE: This medicine is only for you. Do not share this medicine with others. What if I miss a dose? It is important not to miss your dose. Call your doctor or health care professional if you are unable to keep an appointment. What may interact with this medicine? Do not take this medicine with any of the following medications: -clozapine This medicine may also interact with the following medications: -atazanavir -cimetidine -ciprofloxacin -enoxacin -fluvoxamine -medicines for seizures like carbamazepine and phenobarbital -mexiletine -rifampin -tacrine -thiabendazole -zileuton This list may not describe all possible interactions. Give your health care provider a list of all the medicines, herbs, non-prescription drugs, or dietary supplements you use. Also tell them if you smoke, drink alcohol, or use illegal drugs. Some items may interact with your medicine. What should I watch for while using this medicine? This drug may make you feel generally unwell. This is not uncommon, as chemotherapy can affect healthy cells as well as cancer cells. Report any side effects. Continue your course of treatment even though you feel ill unless your doctor tells you to stop. You may need blood work done while you are taking this medicine. Call your doctor or health care professional for advice if you get a fever, chills or sore throat, or other symptoms of a cold or flu. Do not treat yourself. This drug decreases your body's ability to fight infections. Try to avoid being around people who are sick. This medicine may increase your risk to bruise or bleed. Call your doctor or health care professional if you notice any unusual bleeding. Talk to your doctor about your risk of cancer. You may be more at risk for  certain types of cancers if you take this medicine. Do not become pregnant while taking this medicine or for 3 months after stopping it. Women should inform their doctor if they wish to become pregnant or think they might be pregnant. Men should not father a child while taking this medicine and for 3 months after stopping it.There is a potential for serious side effects to an unborn child. Talk to your health care professional or pharmacist for more information. Do not breast-feed an infant while taking this medicine. This medicine may interfere with the ability to have a child. You should talk with your doctor or health care professional if you are concerned about your fertility. What side effects may I notice from receiving this medicine? Side effects that you should report to your doctor or health care professional as soon as possible: -allergic reactions like skin rash, itching or hives, swelling of the face, lips, or tongue -low blood counts - this medicine may decrease the number of white blood cells, red blood cells and platelets. You may be at increased risk for infections and bleeding. -redness, blistering, peeling or loosening of the skin, including inside the mouth -signs of infection - fever or chills, cough, sore throat, pain or difficulty passing urine -signs of decreased platelets or bleeding - bruising, pinpoint red spots on the skin, black, tarry stools,   blood in the urine -signs of decreased red blood cells - unusually weak or tired, fainting spells, lightheadedness -signs and symptoms of kidney injury like trouble passing urine or change in the amount of urine -signs and symptoms of liver injury like dark yellow or brown urine; general ill feeling or flu-like symptoms; light-colored stools; loss of appetite; nausea; right upper belly pain; unusually weak or tired; yellowing of the eyes or skin Side effects that usually do not require medical attention (report to your doctor or health  care professional if they continue or are bothersome): -constipation -decreased appetite -diarrhea -headache -mouth sores -nausea/vomiting -tiredness This list may not describe all possible side effects. Call your doctor for medical advice about side effects. You may report side effects to FDA at 1-800-FDA-1088. Where should I keep my medicine? This drug is given in a hospital or clinic and will not be stored at home. NOTE: This sheet is a summary. It may not cover all possible information. If you have questions about this medicine, talk to your doctor, pharmacist, or health care provider.  2019 Elsevier/Gold Standard (2015-01-12 08:45:41)   

## 2018-06-02 ENCOUNTER — Encounter: Payer: Self-pay | Admitting: Hematology and Oncology

## 2018-06-02 ENCOUNTER — Inpatient Hospital Stay: Payer: Medicare Other

## 2018-06-02 VITALS — BP 132/66 | HR 67 | Temp 97.6°F | Resp 18

## 2018-06-02 DIAGNOSIS — G629 Polyneuropathy, unspecified: Secondary | ICD-10-CM | POA: Diagnosis not present

## 2018-06-02 DIAGNOSIS — Z5111 Encounter for antineoplastic chemotherapy: Secondary | ICD-10-CM | POA: Diagnosis not present

## 2018-06-02 DIAGNOSIS — N179 Acute kidney failure, unspecified: Secondary | ICD-10-CM | POA: Diagnosis not present

## 2018-06-02 DIAGNOSIS — Z112 Encounter for screening for other bacterial diseases: Secondary | ICD-10-CM | POA: Diagnosis not present

## 2018-06-02 DIAGNOSIS — C8318 Mantle cell lymphoma, lymph nodes of multiple sites: Secondary | ICD-10-CM | POA: Diagnosis not present

## 2018-06-02 DIAGNOSIS — E860 Dehydration: Secondary | ICD-10-CM | POA: Diagnosis not present

## 2018-06-02 MED ORDER — HEPARIN SOD (PORK) LOCK FLUSH 100 UNIT/ML IV SOLN
500.0000 [IU] | Freq: Once | INTRAVENOUS | Status: AC | PRN
  Administered 2018-06-02: 500 [IU]
  Filled 2018-06-02: qty 5

## 2018-06-02 MED ORDER — DEXAMETHASONE SODIUM PHOSPHATE 10 MG/ML IJ SOLN
INTRAMUSCULAR | Status: AC
  Filled 2018-06-02: qty 1

## 2018-06-02 MED ORDER — SODIUM CHLORIDE 0.9 % IV SOLN
Freq: Once | INTRAVENOUS | Status: AC
  Administered 2018-06-02: 09:00:00 via INTRAVENOUS
  Filled 2018-06-02: qty 250

## 2018-06-02 MED ORDER — SODIUM CHLORIDE 0.9 % IV SOLN
90.0000 mg/m2 | Freq: Once | INTRAVENOUS | Status: AC
  Administered 2018-06-02: 200 mg via INTRAVENOUS
  Filled 2018-06-02: qty 8

## 2018-06-02 MED ORDER — DEXAMETHASONE SODIUM PHOSPHATE 10 MG/ML IJ SOLN
10.0000 mg | Freq: Once | INTRAMUSCULAR | Status: AC
  Administered 2018-06-02: 10 mg via INTRAVENOUS

## 2018-06-02 MED ORDER — SODIUM CHLORIDE 0.9% FLUSH
10.0000 mL | INTRAVENOUS | Status: DC | PRN
  Administered 2018-06-02: 10 mL
  Filled 2018-06-02: qty 10

## 2018-06-02 NOTE — Patient Instructions (Signed)
[facility] Discharge Instructions for Patients Receiving Chemotherapy  Today you received the following chemotherapy agents Bendeka.  To help prevent nausea and vomiting after your treatment, we encourage you to take your nausea medication as directed.   If you develop nausea and vomiting that is not controlled by your nausea medication, call the clinic.   BELOW ARE SYMPTOMS THAT SHOULD BE REPORTED IMMEDIATELY:  *FEVER GREATER THAN 100.5 F  *CHILLS WITH OR WITHOUT FEVER  NAUSEA AND VOMITING THAT IS NOT CONTROLLED WITH YOUR NAUSEA MEDICATION  *UNUSUAL SHORTNESS OF BREATH  *UNUSUAL BRUISING OR BLEEDING  TENDERNESS IN MOUTH AND THROAT WITH OR WITHOUT PRESENCE OF ULCERS  *URINARY PROBLEMS  *BOWEL PROBLEMS  UNUSUAL RASH Items with * indicate a potential emergency and should be followed up as soon as possible.  Feel free to call the clinic should you have any questions or concerns. The clinic phone number is [phone].  Please show the CHEMO ALERT CARD at check-in to the Emergency Department and triage nurse.   

## 2018-06-02 NOTE — Progress Notes (Signed)
Met with patient in infusion to introduce myself as Arboriculturist and to offer available resources.  Patient has 2 insurances therefore copay assistance is not needed.  Discussed one-time $58 Engineer, drilling to assist with personal expenses while going through treatment.   Gave my card if interested in applying and for any additional financial questions or concerns.

## 2018-06-03 ENCOUNTER — Encounter: Payer: Self-pay | Admitting: Hematology and Oncology

## 2018-06-03 NOTE — Progress Notes (Signed)
MD ok'd for switch to rapid Rituxan.  Pt tol 1st dose Rituxan w/o complication. Orders updated along w/ appt duration & bendamustine offset time. Kennith Center, Pharm.D., CPP 06/03/2018@9 :12 AM

## 2018-06-04 ENCOUNTER — Telehealth: Payer: Self-pay

## 2018-06-04 NOTE — Telephone Encounter (Signed)
Pt's spouse called asking if OK for pt to have dental cleaning.  Per Dr Alvy Bimler, best to be done the week before starting his next chemo cycle.  This information was given to spouse via telephone call.  She verbalizes understanding.

## 2018-06-07 ENCOUNTER — Encounter: Payer: Self-pay | Admitting: Hematology and Oncology

## 2018-06-11 ENCOUNTER — Telehealth: Payer: Self-pay

## 2018-06-11 ENCOUNTER — Encounter: Payer: Self-pay | Admitting: Hematology and Oncology

## 2018-06-11 NOTE — Telephone Encounter (Signed)
Called regarding after hours phone call for temperature of 100.6 last night. He took tylenol last night and temperature came down to 99. Just checked temperature and it is 97.8 now. Offered appt with Dr. Alvy Bimler today with labs prior to appt. Mr. Hashimi declined appt and will monitor at home. Instructed to call the office back if appt needed or for questions/concerns. He verbalized understanding.

## 2018-06-12 ENCOUNTER — Inpatient Hospital Stay: Payer: Medicare Other

## 2018-06-12 ENCOUNTER — Telehealth: Payer: Self-pay

## 2018-06-12 ENCOUNTER — Other Ambulatory Visit: Payer: Self-pay

## 2018-06-12 ENCOUNTER — Encounter: Payer: Self-pay | Admitting: Hematology and Oncology

## 2018-06-12 ENCOUNTER — Inpatient Hospital Stay (HOSPITAL_BASED_OUTPATIENT_CLINIC_OR_DEPARTMENT_OTHER): Payer: Medicare Other | Admitting: Hematology and Oncology

## 2018-06-12 VITALS — BP 113/75 | HR 76 | Temp 97.7°F | Resp 16 | Ht 73.0 in | Wt 217.8 lb

## 2018-06-12 DIAGNOSIS — R102 Pelvic and perineal pain: Secondary | ICD-10-CM

## 2018-06-12 DIAGNOSIS — N3000 Acute cystitis without hematuria: Secondary | ICD-10-CM | POA: Diagnosis not present

## 2018-06-12 DIAGNOSIS — R5081 Fever presenting with conditions classified elsewhere: Secondary | ICD-10-CM | POA: Diagnosis not present

## 2018-06-12 DIAGNOSIS — C8318 Mantle cell lymphoma, lymph nodes of multiple sites: Secondary | ICD-10-CM | POA: Diagnosis not present

## 2018-06-12 DIAGNOSIS — G629 Polyneuropathy, unspecified: Secondary | ICD-10-CM

## 2018-06-12 DIAGNOSIS — N390 Urinary tract infection, site not specified: Secondary | ICD-10-CM | POA: Insufficient documentation

## 2018-06-12 DIAGNOSIS — L270 Generalized skin eruption due to drugs and medicaments taken internally: Secondary | ICD-10-CM | POA: Diagnosis not present

## 2018-06-12 DIAGNOSIS — D709 Neutropenia, unspecified: Secondary | ICD-10-CM

## 2018-06-12 DIAGNOSIS — N179 Acute kidney failure, unspecified: Secondary | ICD-10-CM

## 2018-06-12 DIAGNOSIS — Z112 Encounter for screening for other bacterial diseases: Secondary | ICD-10-CM | POA: Diagnosis not present

## 2018-06-12 DIAGNOSIS — E860 Dehydration: Secondary | ICD-10-CM | POA: Diagnosis not present

## 2018-06-12 DIAGNOSIS — Z5111 Encounter for antineoplastic chemotherapy: Secondary | ICD-10-CM | POA: Diagnosis not present

## 2018-06-12 LAB — CBC WITH DIFFERENTIAL (CANCER CENTER ONLY)
Abs Immature Granulocytes: 0.03 10*3/uL (ref 0.00–0.07)
Basophils Absolute: 0 10*3/uL (ref 0.0–0.1)
Basophils Relative: 1 %
Eosinophils Absolute: 0.3 10*3/uL (ref 0.0–0.5)
Eosinophils Relative: 8 %
HCT: 36 % — ABNORMAL LOW (ref 39.0–52.0)
Hemoglobin: 11.9 g/dL — ABNORMAL LOW (ref 13.0–17.0)
Immature Granulocytes: 1 %
Lymphocytes Relative: 7 %
Lymphs Abs: 0.3 10*3/uL — ABNORMAL LOW (ref 0.7–4.0)
MCH: 29.8 pg (ref 26.0–34.0)
MCHC: 33.1 g/dL (ref 30.0–36.0)
MCV: 90.2 fL (ref 80.0–100.0)
Monocytes Absolute: 0.7 10*3/uL (ref 0.1–1.0)
Monocytes Relative: 18 %
Neutro Abs: 2.5 10*3/uL (ref 1.7–7.7)
Neutrophils Relative %: 65 %
Platelet Count: 147 10*3/uL — ABNORMAL LOW (ref 150–400)
RBC: 3.99 MIL/uL — ABNORMAL LOW (ref 4.22–5.81)
RDW: 13.7 % (ref 11.5–15.5)
WBC Count: 3.8 10*3/uL — ABNORMAL LOW (ref 4.0–10.5)
nRBC: 0 % (ref 0.0–0.2)

## 2018-06-12 LAB — CMP (CANCER CENTER ONLY)
ALT: 27 U/L (ref 0–44)
AST: 25 U/L (ref 15–41)
Albumin: 4 g/dL (ref 3.5–5.0)
Alkaline Phosphatase: 61 U/L (ref 38–126)
Anion gap: 9 (ref 5–15)
BILIRUBIN TOTAL: 1.5 mg/dL — AB (ref 0.3–1.2)
BUN: 22 mg/dL (ref 8–23)
CO2: 24 mmol/L (ref 22–32)
Calcium: 9.1 mg/dL (ref 8.9–10.3)
Chloride: 103 mmol/L (ref 98–111)
Creatinine: 1.29 mg/dL — ABNORMAL HIGH (ref 0.61–1.24)
GFR, Est AFR Am: >60 mL/min (ref >60)
GFR, Estimated: 53 mL/min — ABNORMAL LOW (ref >60)
Glucose, Bld: 97 mg/dL (ref 70–99)
Potassium: 4.5 mmol/L (ref 3.5–5.1)
Sodium: 136 mmol/L (ref 135–145)
TOTAL PROTEIN: 7 g/dL (ref 6.5–8.1)

## 2018-06-12 LAB — URINALYSIS, COMPLETE (UACMP) WITH MICROSCOPIC
Bilirubin Urine: NEGATIVE
Glucose, UA: NEGATIVE mg/dL
Hgb urine dipstick: NEGATIVE
KETONES UR: NEGATIVE mg/dL
LEUKOCYTE UA: NEGATIVE
Nitrite: NEGATIVE
PROTEIN: NEGATIVE mg/dL
Specific Gravity, Urine: 1.003 — ABNORMAL LOW (ref 1.005–1.030)
pH: 6 (ref 5.0–8.0)

## 2018-06-12 LAB — LACTATE DEHYDROGENASE: LDH: 272 U/L — ABNORMAL HIGH (ref 98–192)

## 2018-06-12 LAB — URIC ACID: Uric Acid, Serum: 4.1 mg/dL (ref 3.7–8.6)

## 2018-06-12 MED ORDER — SULFAMETHOXAZOLE-TRIMETHOPRIM 800-160 MG PO TABS: 1.0000 | ORAL_TABLET | Freq: Two times a day (BID) | ORAL | 0 refills | Status: DC

## 2018-06-12 MED ORDER — HYDROCODONE-ACETAMINOPHEN 5-325 MG PO TABS
1.0000 | ORAL_TABLET | Freq: Four times a day (QID) | ORAL | Status: DC | PRN
  Administered 2018-06-12: 1 via ORAL

## 2018-06-12 MED ORDER — PREDNISONE 20 MG PO TABS: 20.0000 mg | ORAL_TABLET | Freq: Every day | ORAL | 0 refills | Status: DC

## 2018-06-12 MED ORDER — ACYCLOVIR 400 MG PO TABS: 400.0000 mg | ORAL_TABLET | Freq: Every day | ORAL | 3 refills | Status: DC

## 2018-06-12 MED ORDER — HYDROCODONE-ACETAMINOPHEN 5-325 MG PO TABS
ORAL_TABLET | ORAL | Status: AC
  Filled 2018-06-12: qty 1

## 2018-06-12 MED ORDER — OXYCODONE HCL 5 MG PO TABS: 5.0000 mg | ORAL_TABLET | Freq: Four times a day (QID) | ORAL | 0 refills | Status: DC | PRN

## 2018-06-12 NOTE — Assessment & Plan Note (Signed)
He had recent exacerbation of peripheral neuropathy Despite taking round-the-clock extra strength Tylenol, it was not helpful I recommend judicious use of low-dose pain medicine for now

## 2018-06-12 NOTE — Telephone Encounter (Signed)
Wife called and left a message. What time did he take the hydrocodone in the office? He is having a lot of pain. Message given to Dr. Alvy Bimler.  Called back. Per Dr. Alvy Bimler, instructed to take oxycodone now and as ordered. Take tylenol when he takes oxycodone. If pain increases over the weekend Dr. Alvy Bimler gives him permission to take 2 oxycodone over the weekend at a time every 6 hours prn. Watch for constipation and take Miralax as directed before. Instructed to call the after hours number over the weekend if needed. Wife verbalized understanding.

## 2018-06-12 NOTE — Assessment & Plan Note (Signed)
He has diffuse skin rash of unknown etiology I recommend Benadryl for itching and low-dose prednisone therapy for a few days for symptomatic relief

## 2018-06-12 NOTE — Assessment & Plan Note (Signed)
His recent blood pressure medication was discontinued His blood pressure is satisfactory With adequate oral hydration, his kidney function is back to baseline Continue close monitoring

## 2018-06-12 NOTE — Progress Notes (Signed)
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  Acute renal failure (ARF) (HCC) His recent blood pressure medication was discontinued His blood pressure is satisfactory With adequate oral hydration, his kidney function is back to baseline Continue close monitoring  Neuropathy He had recent exacerbation of peripheral neuropathy Despite taking round-the-clock extra strength Tylenol, it was not helpful I recommend judicious use of low-dose pain medicine for now  UTI (urinary tract infection) He has significant urinary symptoms that radiates to his back Urinalysis so far showed no significant findings Prostatitis also cannot be excluded Urine culture is pending I recommend antibiotic therapy I will call him next week once final culture results are available  Drug-induced skin rash He has diffuse skin rash of unknown etiology I recommend Benadryl for itching and low-dose prednisone therapy for a few days for symptomatic relief  Suprapubic pain, acute The cause is unknown. It could be related to infection I recommend to just use use of low-dose pain medicine for now I will call him next week for further assessment.   Orders Placed This Encounter  Procedures  . Urine Culture    Standing Status:   Future    Number of Occurrences:   1    Standing Expiration Date:   07/17/2019  . Culture, blood (single) w Reflex to ID Panel    Standing Status:   Future    Number of Occurrences:   1    Standing Expiration Date:   07/17/2019  . Lactate dehydrogenase     Standing Status:   Future    Number of Occurrences:   1    Standing Expiration Date:   06/12/2019  . Uric acid    Standing Status:   Future    Number of Occurrences:   1    Standing Expiration Date:   06/12/2019  . Urinalysis, Complete w Microscopic    Standing Status:   Future    Number of Occurrences:   1    Standing Expiration Date:   06/12/2019    INTERVAL HISTORY: Please see below for problem oriented charting. He is seen urgently due to multiple symptoms. 2 days ago, he developed low-grade fever of 100.6.  It subsequently resolved by taking Tylenol. He was offered an appointment yesterday but declined due to symptoms of feeling better Last night, he had acute onset of flare of his peripheral neuropathy in both feet and hands.   Even after taking extra strength Tylenol 1000 mg 3 times a day, it was not helpful.   He also developed skin rash, diffuse with intense itchiness. He also developed burning sensation in the suprapubic area radiating to the back.   He has frequent urination every 30 minutes over the last few hours with associated nausea. He denies hematuria. The pain in the groin region was rated at 7-8 out of 10 pain. It first started on the left side but the suprapubic region and the right side was also affected.  He has poor sleep.  SUMMARY OF ONCOLOGIC HISTORY:   Mantle cell lymphoma ([place])   06/13/2017 Imaging    Ct imaging at [facility]  1. 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 2. 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 3. Slight reduction in size of right retroperitoneal evolving hematoma.    03/30/2018 Imaging    Ct neck 1. Extensive adenopathy on the left. The largest node is a level 1/submandibular node measuring 38 x 24 x 22 mm. Largest level 2 node measures 3.2 x 2 x 2 cm. Numerous other smaller  but round lymph nodes throughout the left neck in the level 2 through level 4 region. The largest level 5 node measures 3.4 x 3.4 x 2.3 cm with a transverse diameter of 2.3 cm. This contains some internal calcifications. There are numerous other pathologic nodes in the left supraclavicular to axillary region. This pattern of disease could be due to extensive left neck metastatic disease from unknown primary, lymphoma, or other systemic malignancy. 2. No evidence of mucosal or submucosal lesion. 3. Diameter of the ascending aorta is 4.5 cm. Recommend annual imaging followup by CTA or MRA. Aortic Atherosclerosis (ICD10-I70.0).     04/10/2018 Pathology Results    Left zone 1 neck mass, [name], [name], the findings indicate an atypical lymphoid proliferation but are too limited for definitive diagnosis. Excisional biopsy with flow cytometry is recommended.    04/10/2018 Procedure    He was ENT who performed FNA of neck LN    04/27/2018 Pathology Results    Lymph node for lymphoma, [name], clumped chromatin. By immunohistochemistry the lymphocytes are B-cells with  expression of CD20, [name], [name], [name], [name], [name], the features are consistent with a mantle cell lymphoma    05/13/2018 PET scan    1. Hypermetabolic left cervical lymphadenopathy, left axillary lymphadenopathy, mediastinal / right hilar lymphadenopathy, right external iliac lymphadenopathy, and subcentimeter left external iliac and left inguinal lymph nodes, [name], [name], [name], without focal lesions, that may represent lymphomatous involvement of the spleen.    05/21/2018 Cancer Staging    Staging form: Hodgkin and Non-Hodgkin Lymphoma, AJCC 8th Edition - Clinical: Stage III - Signed by Heath Lark, MD on 05/21/2018    05/26/2018 Procedure    Ultrasound and fluoroscopically guided right internal jugular single lumen power port catheter insertion. Tip in the SVC/RA junction. Catheter ready for use.     06/01/2018 -  Chemotherapy    The patient had Bendamustine and  Rituximab for chemotherapy treatment     REVIEW OF SYSTEMS:   Constitutional: Denies fevers, chills or abnormal weight loss Eyes: Denies blurriness of vision Ears, nose, mouth, throat, and face: Denies mucositis or sore throat Respiratory: Denies cough, dyspnea or wheezes Cardiovascular: Denies palpitation, chest discomfort or lower extremity swelling Lymphatics: Denies new lymphadenopathy or easy bruising Behavioral/Psych: Mood is stable, no new changes  All other systems were reviewed with the patient and are negative.  I have reviewed the past medical history, past surgical history,  social history and family history with the patient and they are unchanged from previous note.  ALLERGIES:  is allergic to aspirin; levaquin [levofloxacin in d5w]; nickel; statins; and ampicillin.  MEDICATIONS:  Current Outpatient Medications  Medication Sig Dispense Refill  . acetaminophen (TYLENOL) 500 MG tablet Take 1,000 mg by mouth daily as needed for moderate pain.    Marland Kitchen acyclovir (ZOVIRAX) 400 MG tablet Take 1 tablet (400 mg total) by mouth daily. 90 tablet 3  . alfuzosin (UROXATRAL) 10 MG 24 hr tablet Take 10 mg by mouth at bedtime.     Marland Kitchen allopurinol (ZYLOPRIM) 300 MG tablet Take 1 tablet (300 mg total) by mouth daily. 30 tablet 3  . buPROPion (WELLBUTRIN XL) 150 MG 24 hr tablet Take 1 tablet (150 mg total) by mouth daily. 30 tablet 5  . Cholecalciferol (VITAMIN D) 2000 units tablet Take 2,000 Units by mouth at bedtime.     Marland Kitchen ezetimibe (ZETIA) 10 MG tablet Take 10 mg by mouth at bedtime.     . finasteride (PROSCAR) 5 MG tablet Take 5 mg by mouth daily.     Marland Kitchen lidocaine-prilocaine (EMLA) cream Apply to affected area once 30 g 3  . metoprolol succinate (TOPROL-XL) 25 MG 24 hr tablet Take 12.5 mg by mouth daily.     . ondansetron (ZOFRAN) 8 MG tablet Take 1 tablet (8 mg total) by mouth every 8 (eight) hours as needed for refractory nausea / vomiting. 30 tablet 1  . OVER THE COUNTER MEDICATION Apply 1 application topically as needed. Uses lotion with CBD in it for joints     . oxyCODONE (OXY IR/ROXICODONE) 5 MG immediate release tablet Take 1 tablet (5 mg total) by mouth every 6 (six) hours as needed for severe pain. 20 tablet 0  . Polyethyl Glycol-Propyl Glycol (SYSTANE OP) Place 1 drop into both eyes 2 (two) times daily.    . predniSONE (DELTASONE) 20 MG tablet Take 1 tablet (20 mg total) by mouth daily with breakfast. 7 tablet 0  . prochlorperazine (COMPAZINE) 10 MG tablet Take 1 tablet (10 mg total) by mouth every 6 (six) hours as needed (Nausea or vomiting). 30 tablet 1  .  sulfamethoxazole-trimethoprim (BACTRIM DS,SEPTRA DS) 800-160 MG tablet Take 1 tablet by mouth 2 (two) times daily. 14 tablet 0   Current Facility-Administered Medications  Medication Dose Route Frequency Provider Last Rate Last Dose  . HYDROcodone-acetaminophen (NORCO/VICODIN) 5-325 MG per tablet 1 tablet  1 tablet Oral Q6H PRN Heath Lark, MD   1 tablet at 06/12/18 1033    PHYSICAL EXAMINATION: ECOG PERFORMANCE STATUS: 1 - Symptomatic but completely ambulatory  Vitals:   06/12/18 0918  BP: 113/75  Pulse: 76  Resp: 16  Temp: 97.7 F (36.5 C)  SpO2: 98%   Filed Weights   06/12/18 0918  Weight: 217 lb 12.8 oz (98.8 kg)    GENERAL:alert, no distress and comfortable SKIN: He has diffuse macular rash consistent with drug related skin rash EYES:  normal, Conjunctiva are pink and non-injected, sclera clear OROPHARYNX:no exudate, no erythema and lips, buccal mucosa, and tongue normal  NECK: supple, thyroid normal size, non-tender, without nodularity LYMPH: Previously palpable lymphadenopathy is at least 50% better LUNGS: clear to auscultation and percussion with normal breathing effort HEART: regular rate & rhythm and no murmurs and no lower extremity edema ABDOMEN:abdomen soft, non-tender and normal bowel sounds Musculoskeletal:no cyanosis of digits and no clubbing  NEURO: alert & oriented x 3 with fluent speech, no focal motor/sensory deficits  LABORATORY DATA:  I have reviewed the data as listed    Component Value Date/Time   NA 136 06/12/2018 1010   K 4.5 06/12/2018 1010   CL 103 06/12/2018 1010   CO2 24 06/12/2018 1010   GLUCOSE 97 06/12/2018 1010   BUN 22 06/12/2018 1010   CREATININE 1.29 (H) 06/12/2018 1010   CALCIUM 9.1 06/12/2018 1010   PROT 7.0 06/12/2018 1010   PROT 6.6 07/23/2017 1504   ALBUMIN 4.0 06/12/2018 1010   AST 25 06/12/2018 1010   ALT 27 06/12/2018 1010   ALKPHOS 61 06/12/2018 1010   BILITOT 1.5 (H) 06/12/2018 1010   GFRNONAA 53 (L) 06/12/2018 1010    GFRAA >60 06/12/2018 1010    No results found for: SPEP, UPEP  Lab Results  Component Value Date   WBC 3.8 (L) 06/12/2018   NEUTROABS 2.5 06/12/2018   HGB 11.9 (L) 06/12/2018   HCT 36.0 (L) 06/12/2018   MCV 90.2 06/12/2018   PLT 147 (L) 06/12/2018      Chemistry      Component Value Date/Time   NA 136 06/12/2018 1010   K 4.5 06/12/2018 1010   CL 103 06/12/2018 1010   CO2 24 06/12/2018 1010   BUN 22 06/12/2018 1010   CREATININE 1.29 (H) 06/12/2018 1010      Component Value Date/Time   CALCIUM 9.1 06/12/2018 1010   ALKPHOS 61 06/12/2018 1010   AST 25 06/12/2018 1010   ALT 27 06/12/2018 1010   BILITOT 1.5 (H) 06/12/2018 1010       RADIOGRAPHIC STUDIES: I have personally reviewed the radiological images as listed and agreed with the findings in the report. Ir Imaging Guided Port Insertion  Result Date: 05/26/2018 CLINICAL DATA:  MANTLE CELL LYMPHOMA EXAM: RIGHT IJ SINGLE LUMEN POWER PORT CATHETER INSERTION Date:  05/26/2018 05/26/2018 11:13 am Radiologist:  M. Daryll Brod, MD Guidance:  Ultrasound and fluoroscopic MEDICATIONS: 1 g vancomycin; The antibiotic was administered within an appropriate time interval prior to skin puncture. ANESTHESIA/SEDATION: Versed 3.0 mg IV; Fentanyl 150 mcg IV; Moderate Sedation Time:  26 minutes The patient was continuously monitored during the procedure by the interventional radiology nurse under my direct supervision. FLUOROSCOPY TIME:  0 minutes, 36 seconds (3 mGy) COMPLICATIONS: None immediate. CONTRAST:  None. PROCEDURE: Informed consent was obtained from the patient following explanation of the procedure, risks, benefits and alternatives. The patient understands, agrees and consents for the procedure. All questions were addressed. A time out was performed. Maximal barrier sterile technique utilized including caps, mask, sterile gowns, sterile gloves, large sterile drape, hand hygiene, and 2% chlorhexidine scrub. Under sterile conditions and  local anesthesia, right internal jugular micropuncture venous access was performed. Access was performed with ultrasound. Images were obtained for documentation of the patent right internal jugular vein. A guide wire was inserted followed by a transitional dilator. This allowed insertion of a guide wire and catheter into the IVC. Measurements were obtained from the SVC / RA junction  back to the right IJ venotomy site. In the right infraclavicular chest, a subcutaneous pocket was created over the second anterior rib. This was done under sterile conditions and local anesthesia. 1% lidocaine with epinephrine was utilized for this. A 2.5 cm incision was made in the skin. Blunt dissection was performed to create a subcutaneous pocket over the right pectoralis major muscle. The pocket was flushed with saline vigorously. There was adequate hemostasis. The port catheter was assembled and checked for leakage. The port catheter was secured in the pocket with two retention sutures. The tubing was tunneled subcutaneously to the right venotomy site and inserted into the SVC/RA junction through a valved peel-away sheath. Position was confirmed with fluoroscopy. Images were obtained for documentation. The patient tolerated the procedure well. No immediate complications. Incisions were closed in a two layer fashion with 4 - 0 Vicryl suture. Dermabond was applied to the skin. The port catheter was accessed, blood was aspirated followed by saline and heparin flushes. Needle was removed. A dry sterile dressing was applied. IMPRESSION: Ultrasound and fluoroscopically guided right internal jugular single lumen power port catheter insertion. Tip in the SVC/RA junction. Catheter ready for use. Electronically Signed   By: Jerilynn Mages.  Shick M.D.   On: 05/26/2018 11:19    All questions were answered. The patient knows to call the clinic with any problems, questions or concerns. No barriers to learning was detected.  I spent 40 minutes  counseling the patient face to face. The total time spent in the appointment was 55 minutes and more than 50% was on counseling and review of test results  Heath Lark, MD 06/12/2018 5:29 PM

## 2018-06-12 NOTE — Assessment & Plan Note (Signed)
The cause is unknown. It could be related to infection I recommend to just use use of low-dose pain medicine for now I will call him next week for further assessment.

## 2018-06-12 NOTE — Assessment & Plan Note (Signed)
He has significant urinary symptoms that radiates to his back Urinalysis so far showed no significant findings Prostatitis also cannot be excluded Urine culture is pending I recommend antibiotic therapy I will call him next week once final culture results are available

## 2018-06-12 NOTE — Assessment & Plan Note (Signed)
From the lymphoma standpoint, he appears to have responded well to treatment with regression of the size of lymphadenopathy Continue aggressive supportive care for now.

## 2018-06-13 LAB — URINE CULTURE: Culture: NO GROWTH

## 2018-06-15 ENCOUNTER — Telehealth: Payer: Self-pay

## 2018-06-15 NOTE — Telephone Encounter (Signed)
-----   Message from Heath Lark, MD sent at 06/15/2018  8:28 AM EDT ----- Regarding: how is he doing? Is he better? Blood cultures and urine cultures are neg

## 2018-06-15 NOTE — Telephone Encounter (Signed)
Called and given below message to Sean Stanley and his wife. They verbalized understanding. He is better with pain and no pain medication since 4 am yesterday. Rash and itching is better. He and his wife states they will continue Prednisone for the 7 days for the rash. They will continue antibiotic just in case it is Prostatitis. Instructed to call the office if needed, they both verbalized understanding.

## 2018-06-16 ENCOUNTER — Telehealth: Payer: Self-pay

## 2018-06-16 ENCOUNTER — Other Ambulatory Visit: Payer: Self-pay | Admitting: Emergency Medicine

## 2018-06-16 ENCOUNTER — Other Ambulatory Visit: Payer: Self-pay

## 2018-06-16 ENCOUNTER — Inpatient Hospital Stay: Payer: Medicare Other

## 2018-06-16 ENCOUNTER — Inpatient Hospital Stay (HOSPITAL_BASED_OUTPATIENT_CLINIC_OR_DEPARTMENT_OTHER): Payer: Medicare Other | Admitting: Medical

## 2018-06-16 VITALS — BP 105/62 | HR 88 | Temp 97.5°F | Resp 19 | Ht 73.0 in | Wt 214.6 lb

## 2018-06-16 DIAGNOSIS — Z5111 Encounter for antineoplastic chemotherapy: Secondary | ICD-10-CM | POA: Diagnosis not present

## 2018-06-16 DIAGNOSIS — E860 Dehydration: Secondary | ICD-10-CM

## 2018-06-16 DIAGNOSIS — Z112 Encounter for screening for other bacterial diseases: Secondary | ICD-10-CM | POA: Diagnosis not present

## 2018-06-16 DIAGNOSIS — N179 Acute kidney failure, unspecified: Secondary | ICD-10-CM | POA: Diagnosis not present

## 2018-06-16 DIAGNOSIS — C8318 Mantle cell lymphoma, lymph nodes of multiple sites: Secondary | ICD-10-CM

## 2018-06-16 DIAGNOSIS — I951 Orthostatic hypotension: Secondary | ICD-10-CM | POA: Diagnosis not present

## 2018-06-16 DIAGNOSIS — G629 Polyneuropathy, unspecified: Secondary | ICD-10-CM | POA: Diagnosis not present

## 2018-06-16 DIAGNOSIS — L270 Generalized skin eruption due to drugs and medicaments taken internally: Secondary | ICD-10-CM | POA: Diagnosis not present

## 2018-06-16 DIAGNOSIS — Z79899 Other long term (current) drug therapy: Secondary | ICD-10-CM

## 2018-06-16 LAB — CBC WITH DIFFERENTIAL (CANCER CENTER ONLY)
ABS IMMATURE GRANULOCYTES: 0.05 10*3/uL (ref 0.00–0.07)
BASOS PCT: 1 %
Basophils Absolute: 0.1 10*3/uL (ref 0.0–0.1)
Eosinophils Absolute: 0.4 10*3/uL (ref 0.0–0.5)
Eosinophils Relative: 6 %
HCT: 39.2 % (ref 39.0–52.0)
Hemoglobin: 12.9 g/dL — ABNORMAL LOW (ref 13.0–17.0)
Immature Granulocytes: 1 %
LYMPHS PCT: 9 %
Lymphs Abs: 0.6 10*3/uL — ABNORMAL LOW (ref 0.7–4.0)
MCH: 30.1 pg (ref 26.0–34.0)
MCHC: 32.9 g/dL (ref 30.0–36.0)
MCV: 91.6 fL (ref 80.0–100.0)
Monocytes Absolute: 1.5 10*3/uL — ABNORMAL HIGH (ref 0.1–1.0)
Monocytes Relative: 22 %
NEUTROS ABS: 4.2 10*3/uL (ref 1.7–7.7)
Neutrophils Relative %: 61 %
Platelet Count: 212 10*3/uL (ref 150–400)
RBC: 4.28 MIL/uL (ref 4.22–5.81)
RDW: 14 % (ref 11.5–15.5)
WBC Count: 6.9 10*3/uL (ref 4.0–10.5)
nRBC: 0 % (ref 0.0–0.2)

## 2018-06-16 LAB — CMP (CANCER CENTER ONLY)
ALT: 38 U/L (ref 0–44)
AST: 22 U/L (ref 15–41)
Albumin: 4 g/dL (ref 3.5–5.0)
Alkaline Phosphatase: 55 U/L (ref 38–126)
Anion gap: 12 (ref 5–15)
BUN: 28 mg/dL — ABNORMAL HIGH (ref 8–23)
CO2: 23 mmol/L (ref 22–32)
Calcium: 9 mg/dL (ref 8.9–10.3)
Chloride: 103 mmol/L (ref 98–111)
Creatinine: 1.56 mg/dL — ABNORMAL HIGH (ref 0.61–1.24)
GFR, Est AFR Am: 49 mL/min — ABNORMAL LOW (ref >60)
GFR, Estimated: 42 mL/min — ABNORMAL LOW (ref >60)
GLUCOSE: 85 mg/dL (ref 70–99)
Potassium: 4.2 mmol/L (ref 3.5–5.1)
Sodium: 138 mmol/L (ref 135–145)
Total Bilirubin: 0.9 mg/dL (ref 0.3–1.2)
Total Protein: 7 g/dL (ref 6.5–8.1)

## 2018-06-16 LAB — SAMPLE TO BLOOD BANK

## 2018-06-16 NOTE — Telephone Encounter (Signed)
Received call from pt spouse stating pt "woozy and light headed".  Systolic BP has been 82, 86.  She did not know what diastolic was.  No SHOB, CP, n/v,d, fever/chills. Pt advised to come to [facility].  Pt agreeable.

## 2018-06-16 NOTE — Progress Notes (Signed)
Pt offered IVF by PA Sandi Mealy d/t slight dehydration with mildly positive OVS per PA, or to push oral fluids at home.  Pt declined IVF, VU of d/c instructions to push oral fluids and to call back as needed.

## 2018-06-16 NOTE — Patient Instructions (Signed)
Dehydration, Adult  Dehydration is when there is not enough fluid or water in your body. This happens when you lose more fluids than you take in. Dehydration can range from mild to very bad. It should be treated right away to keep it from getting very bad. Symptoms of mild dehydration may include:  Thirst.  Dry lips.  Slightly dry mouth.  Dry, warm skin.  Dizziness. Symptoms of moderate dehydration may include:  Very dry mouth.  Muscle cramps.  Dark pee (urine). Pee may be the color of tea.  Your body making less pee.  Your eyes making fewer tears.  Heartbeat that is uneven or faster than normal (palpitations).  Headache.  Light-headedness, especially when you stand up from sitting.  Fainting (syncope). Symptoms of very bad dehydration may include:  Changes in skin, such as: ? Cold and clammy skin. ? Blotchy (mottled) or pale skin. ? Skin that does not quickly return to normal after being lightly pinched and let go (poor skin turgor).  Changes in body fluids, such as: ? Feeling very thirsty. ? Your eyes making fewer tears. ? Not sweating when body temperature is high, such as in hot weather. ? Your body making very little pee.  Changes in vital signs, such as: ? Weak pulse. ? Pulse that is more than 100 beats a minute when you are sitting still. ? Fast breathing. ? Low blood pressure.  Other changes, such as: ? Sunken eyes. ? Cold hands and feet. ? Confusion. ? Lack of energy (lethargy). ? Trouble waking up from sleep. ? Short-term weight loss. ? Unconsciousness. Follow these instructions at home:   If told by your doctor, drink an ORS: ? Make an ORS by using instructions on the package. ? Start by drinking small amounts, about  cup (120 mL) every 5-10 minutes. ? Slowly drink more until you have had the amount that your doctor said to have.  Drink enough clear fluid to keep your pee clear or pale yellow. If you were told to drink an ORS, finish the  ORS first, then start slowly drinking clear fluids. Drink fluids such as: ? Water. Do not drink only water by itself. Doing that can make the salt (sodium) level in your body get too low (hyponatremia). ? Ice chips. ? Fruit juice that you have added water to (diluted). ? Low-calorie sports drinks.  Avoid: ? Alcohol. ? Drinks that have a lot of sugar. These include high-calorie sports drinks, fruit juice that does not have water added, and soda. ? Caffeine. ? Foods that are greasy or have a lot of fat or sugar.  Take over-the-counter and prescription medicines only as told by your doctor.  Do not take salt tablets. Doing that can make the salt level in your body get too high (hypernatremia).  Eat foods that have minerals (electrolytes). Examples include bananas, oranges, potatoes, tomatoes, and spinach.  Keep all follow-up visits as told by your doctor. This is important. Contact a doctor if:  You have belly (abdominal) pain that: ? Gets worse. ? Stays in one area (localizes).  You have a rash.  You have a stiff neck.  You get angry or annoyed more easily than normal (irritability).  You are more sleepy than normal.  You have a harder time waking up than normal.  You feel: ? Weak. ? Dizzy. ? Very thirsty.  You have peed (urinated) only a small amount of very dark pee during 6-8 hours. Get help right away if:  You have   symptoms of very bad dehydration.  You cannot drink fluids without throwing up (vomiting).  Your symptoms get worse with treatment.  You have a fever.  You have a very bad headache.  You are throwing up or having watery poop (diarrhea) and it: ? Gets worse. ? Does not go away.  You have blood or something green (bile) in your throw-up.  You have blood in your poop (stool). This may cause poop to look black and tarry.  You have not peed in 6-8 hours.  You pass out (faint).  Your heart rate when you are sitting still is more than 100 beats a  minute.  You have trouble breathing. This information is not intended to replace advice given to you by your health care provider. Make sure you discuss any questions you have with your health care provider. Document Released: 01/05/2009 Document Revised: 09/29/2015 Document Reviewed: 05/05/2015 Elsevier Interactive Patient Education  2019 [employer]  

## 2018-06-17 ENCOUNTER — Other Ambulatory Visit: Payer: Self-pay

## 2018-06-17 ENCOUNTER — Inpatient Hospital Stay (HOSPITAL_BASED_OUTPATIENT_CLINIC_OR_DEPARTMENT_OTHER): Payer: Medicare Other | Admitting: Medical

## 2018-06-17 ENCOUNTER — Telehealth: Payer: Self-pay | Admitting: Emergency Medicine

## 2018-06-17 VITALS — BP 146/89 | HR 84 | Temp 97.8°F | Resp 18 | Ht 73.0 in | Wt 215.7 lb

## 2018-06-17 DIAGNOSIS — Z79899 Other long term (current) drug therapy: Secondary | ICD-10-CM | POA: Diagnosis not present

## 2018-06-17 DIAGNOSIS — Z5111 Encounter for antineoplastic chemotherapy: Secondary | ICD-10-CM | POA: Diagnosis not present

## 2018-06-17 DIAGNOSIS — Z112 Encounter for screening for other bacterial diseases: Secondary | ICD-10-CM | POA: Diagnosis not present

## 2018-06-17 DIAGNOSIS — G629 Polyneuropathy, unspecified: Secondary | ICD-10-CM | POA: Diagnosis not present

## 2018-06-17 DIAGNOSIS — C8318 Mantle cell lymphoma, lymph nodes of multiple sites: Secondary | ICD-10-CM | POA: Diagnosis not present

## 2018-06-17 DIAGNOSIS — L270 Generalized skin eruption due to drugs and medicaments taken internally: Secondary | ICD-10-CM

## 2018-06-17 DIAGNOSIS — N179 Acute kidney failure, unspecified: Secondary | ICD-10-CM | POA: Diagnosis not present

## 2018-06-17 DIAGNOSIS — T50905A Adverse effect of unspecified drugs, medicaments and biological substances, initial encounter: Secondary | ICD-10-CM

## 2018-06-17 DIAGNOSIS — E860 Dehydration: Secondary | ICD-10-CM | POA: Diagnosis not present

## 2018-06-17 LAB — CULTURE, BLOOD (SINGLE)
Culture: NO GROWTH
SPECIAL REQUESTS: ADEQUATE

## 2018-06-17 MED ORDER — PREDNISONE 5 MG PO TABS: ORAL_TABLET | ORAL | 0 refills | Status: DC

## 2018-06-17 NOTE — Patient Instructions (Signed)

## 2018-06-17 NOTE — Progress Notes (Signed)
[facility] Progress Note   Sean Stanley [id number] [birth year] 78 y.o.  Sean Stanley is managed by Dr. 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  The patient's labs returned showing a creatinine that was slightly elevated and the BUN that was slightly elevated also.  And additionally his physical exam showed orthostatic hypotension.  He was instructed to increase his fluid intake by at least 30%.  He expresses understanding and agreement with this plan.  Mantle cell lymphoma: The patient is status post cycle 1 of bendamustine and rituximab which was dosed on 06/01/2018.  He is scheduled to see Dr. Alvy Bimler in follow-up on 06/29/2018.   Please see After Visit Summary for patient specific instructions.  Future Appointments  Date Time Provider [place]  06/29/2018  9:45 AM [facility] LAB 2 [facility] None  06/29/2018 10:00 AM [facility] [place] FLUSH [facility] None  06/29/2018 10:30 AM Heath Lark, MD [facility] None  06/29/2018 11:30 AM [facility] INFUSION [facility] None  06/30/2018  9:00 AM [facility] INFUSION [facility] None  08/25/2018  1:30 PM Dohmeier, Asencion Partridge, MD [facility] None  11/20/2018  7:45 AM Hayden Pedro, MD [facility] None    No orders of the defined types were placed in this encounter.      Subjective:   Patient ID:  Sean Stanley is a 78 y.o. (DOB 08-04-1940) male.  Chief Complaint:  Chief Complaint  Patient presents with   Dizziness    HPI Sean Stanley   is a 78 year old male with a history of a mantle cell lymphoma who is managed by Dr. Heath Lark.  He is status post cycle 1 of bendamustine and rituximab which was  dosed on 06/01/2018.  He presents to the office today with a report of dizziness, lightheadedness, and weakness in his legs.  He has a history of a mitral valve repair.  He is followed by [facility] cardiology.  He continues on metoprolol.  He reports that he has been eating and drinking well.  He has been urinating normally.  He denies fevers, chills, sweats, headache, nausea, vomiting, constipation, or diarrhea.  He is recently been treated for a drug rash.  He was also treated empirically for a UTI vis prostatitis.  Medications: I have reviewed the patient's current medications.  Allergies:  Allergies  Allergen Reactions   Aspirin     Has been instructed not to take any blood thinners even aspirin due to history of several aneurysms    Levaquin [Levofloxacin In D5w]     tendonitis   Nickel Itching   Statins Other (See Comments)    Muscle aches   Allopurinol Rash   Ampicillin Rash    Did it involve swelling of the face/tongue/throat, SOB, or low BP? No Did it involve sudden or severe rash/hives, skin peeling, or any reaction on the inside of your mouth or nose? Yes Did you need to seek medical attention at a hospital or doctor's office? Yes When did it last happen?50+ years If all above answers are NO, may proceed with cephalosporin use.     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 Maxwell Caul told him he had improved   Pneumonia    Thoracic ascending aortic aneurysm ([place])    4.5 cm 03/2018 CT    Past Surgical History:  Procedure Laterality Date   ABDOMINAL ANGIOGRAM  08/05/12   aneurysm repair     CARDIOVERSION  02/12/2012   Procedure: CARDIOVERSION;  Surgeon: Pixie Casino, MD;  [facility] ENDOSCOPY;  Service: Cardiovascular;  Laterality: N/A;   CARDIOVERSION N/A 06/22/2013   Procedure: CARDIOVERSION;  Surgeon: [name], MD;  Location: [place];  Service: Cardiovascular;  Laterality: N/A;   CATARACT EXTRACTION Bilateral 2018   with lens implant   CHOLECYSTECTOMY     COLONOSCOPY     IR IMAGING GUIDED PORT INSERTION  05/26/2018   LYMPH NODE BIOPSY Left 04/27/2018   Procedure: EXCISIONAL BIOPSY OF LEFT CERVICAL LYMPH NODE;  Surgeon: Melida Quitter, MD;  Location: [facility] OR;  Service: ENT;  Laterality: Left;   MENISCUS REPAIR Right 2009   MITRAL VALVE REPAIR  2002   [facility]   NM MYOVIEW LTD  07/22/2006   no ischemia   RIGHT HEART CATH  06/19/2004   normal right heart dynamics. EF 50%   TEE WITHOUT CARDIOVERSION  02/12/2012   Procedure: TRANSESOPHAGEAL ECHOCARDIOGRAM (TEE);  Surgeon: Pixie Casino, MD;  Location: [facility] ENDOSCOPY;  Service: Cardiovascular;  Laterality: N/A;   TEE WITHOUT CARDIOVERSION N/A 06/22/2013   Procedure: TRANSESOPHAGEAL ECHOCARDIOGRAM (TEE);  Surgeon: [name], MD;  Location: [facility] ENDOSCOPY;  Service: Cardiovascular;  Laterality: N/A;    Family History  Problem Relation Age of Onset   Parkinson's disease Mother 30   Heart failure Father 44   Cancer Maternal  Grandmother    Heart attack Maternal Grandfather    Heart attack Paternal Grandmother    Heart attack Paternal Grandfather     Social History   Socioeconomic History   Marital status: Married    Spouse name: [name]   Number of children: 2   Years of education: Not on file   Highest education level: Not on file  Occupational History   Occupation: retired Retail banker strain: Not on file   Food insecurity:    Worry: Not on file    Inability: Not on file   Transportation needs:    Medical: Not on file    Non-medical: Not on file  Tobacco Use   Smoking status: Never Smoker   Smokeless tobacco: Never Used  Substance and Sexual Activity   Alcohol  use: Yes    Comment: social   Drug use: No   Sexual activity: Not on file  Lifestyle   Physical activity:    Days per week: Not on file    Minutes per session: Not on file   Stress: Not on file  Relationships   Social connections:    Talks on phone: Not on file    Gets together: Not on file    Attends religious service: Not on file    Active member of club or organization: Not on file    Attends meetings of clubs or organizations: Not on file    Relationship status: Not on file   Intimate partner violence:    Fear of current or ex partner: Not on file    Emotionally abused: Not on file    Physically abused: Not on file    Forced sexual activity: Not on file  Other Topics Concern   Not on file  Social History Narrative   Lives in [place], Retired    Past Medical History, Surgical history, Social history, and Family history were reviewed and updated as appropriate.   Please see review of systems for further details on the patient's review from today.   Review of Systems:  Review of Systems  Constitutional: Negative for appetite change, chills, diaphoresis and fever.  HENT: Negative for dental problem, mouth sores and trouble swallowing.   Respiratory: Negative for cough,  chest tightness and shortness of breath.   Cardiovascular: Negative for chest pain and palpitations.  Gastrointestinal: Negative for constipation, diarrhea, nausea and vomiting.  Skin: Positive for rash.  Neurological: Positive for dizziness and weakness. Negative for syncope and headaches.    Objective:   Physical Exam:  BP 105/62 (BP Location: Left Arm, Patient Position: Sitting)    Pulse 88    Temp (!) 97.5 F (36.4 C) (Oral)    Resp 19    Ht 6\' 1"  (1.854 m)    Wt 214 lb 9.6 oz (97.3 kg)    SpO2 98%    BMI 28.31 kg/m  ECOG: 0  Orthostatic Blood Pressure: Blood pressure:   sitting 116/80, standing 90/58 Pulse:   sitting 74, standing 85   Physical Exam Constitutional:      General: He is not in acute distress.    Appearance: He is not diaphoretic.  HENT:     Head: Normocephalic and atraumatic.  Cardiovascular:     Rate and Rhythm: Normal rate and regular rhythm.     Heart sounds: Normal heart sounds. No murmur. No friction rub. No gallop.   Pulmonary:     Effort: Pulmonary effort is normal. No respiratory distress.     Breath sounds: Normal breath sounds. No stridor. No wheezing or rales.  Musculoskeletal:        General: No deformity.  Skin:    General: Skin is warm and dry.     Findings: Erythema and rash present.     Comments: Diffuse erythematous and slightly raised rash over the chest, abdomen, back, and upper extremities.  Neurological:     Mental Status: He is alert.     Coordination: Coordination normal.     Lab Review:     Component Value Date/Time   NA 138 06/16/2018 1253   K 4.2 06/16/2018 1253   CL 103 06/16/2018 1253   CO2 23 06/16/2018 1253   GLUCOSE 85 06/16/2018 1253   BUN 28 (H) 06/16/2018 1253   CREATININE 1.56 (H) 06/16/2018 1253   CALCIUM  9.0 06/16/2018 1253   PROT 7.0 06/16/2018 1253   PROT 6.6 07/23/2017 1504   ALBUMIN 4.0 06/16/2018 1253   AST 22 06/16/2018 1253   ALT 38 06/16/2018 1253   ALKPHOS 55 06/16/2018 1253   BILITOT 0.9  06/16/2018 1253   GFRNONAA 42 (L) 06/16/2018 1253   GFRAA 49 (L) 06/16/2018 1253       Component Value Date/Time   WBC 6.9 06/16/2018 1253   WBC 4.9 05/26/2018 0755   RBC 4.28 06/16/2018 1253   HGB 12.9 (L) 06/16/2018 1253   HCT 39.2 06/16/2018 1253   PLT 212 06/16/2018 1253   MCV 91.6 06/16/2018 1253   MCH 30.1 06/16/2018 1253   MCHC 32.9 06/16/2018 1253   RDW 14.0 06/16/2018 1253   LYMPHSABS 0.6 (L) 06/16/2018 1253   MONOABS 1.5 (H) 06/16/2018 1253   EOSABS 0.4 06/16/2018 1253   BASOSABS 0.1 06/16/2018 1253   -------------------------------  Imaging from last 24 hours (if applicable):  Radiology interpretation: Ir Imaging Guided Port Insertion  Result Date: 05/26/2018 CLINICAL DATA:  MANTLE CELL LYMPHOMA EXAM: RIGHT IJ SINGLE LUMEN POWER PORT CATHETER INSERTION Date:  05/26/2018 05/26/2018 11:13 am Radiologist:  M. Daryll Brod, MD Guidance:  Ultrasound and fluoroscopic MEDICATIONS: 1 g vancomycin; The antibiotic was administered within an appropriate time interval prior to skin puncture. ANESTHESIA/SEDATION: Versed 3.0 mg IV; Fentanyl 150 mcg IV; Moderate Sedation Time:  26 minutes The patient was continuously monitored during the procedure by the interventional radiology nurse under my direct supervision. FLUOROSCOPY TIME:  0 minutes, 36 seconds (3 mGy) COMPLICATIONS: None immediate. CONTRAST:  None. PROCEDURE: Informed consent was obtained from the patient following explanation of the procedure, risks, benefits and alternatives. The patient understands, agrees and consents for the procedure. All questions were addressed. A time out was performed. Maximal barrier sterile technique utilized including caps, mask, sterile gowns, sterile gloves, large sterile drape, hand hygiene, and 2% chlorhexidine scrub. Under sterile conditions and local anesthesia, right internal jugular micropuncture venous access was performed. Access was performed with ultrasound. Images were obtained for  documentation of the patent right internal jugular vein. A guide wire was inserted followed by a transitional dilator. This allowed insertion of a guide wire and catheter into the IVC. Measurements were obtained from the SVC / RA junction back to the right IJ venotomy site. In the right infraclavicular chest, a subcutaneous pocket was created over the second anterior rib. This was done under sterile conditions and local anesthesia. 1% lidocaine with epinephrine was utilized for this. A 2.5 cm incision was made in the skin. Blunt dissection was performed to create a subcutaneous pocket over the right pectoralis major muscle. The pocket was flushed with saline vigorously. There was adequate hemostasis. The port catheter was assembled and checked for leakage. The port catheter was secured in the pocket with two retention sutures. The tubing was tunneled subcutaneously to the right venotomy site and inserted into the SVC/RA junction through a valved peel-away sheath. Position was confirmed with fluoroscopy. Images were obtained for documentation. The patient tolerated the procedure well. No immediate complications. Incisions were closed in a two layer fashion with 4 - 0 Vicryl suture. Dermabond was applied to the skin. The port catheter was accessed, blood was aspirated followed by saline and heparin flushes. Needle was removed. A dry sterile dressing was applied. IMPRESSION: Ultrasound and fluoroscopically guided right internal jugular single lumen power port catheter insertion. Tip in the SVC/RA junction. Catheter ready for use. Electronically Signed   By:  M.  Shick M.D.   On: 05/26/2018 11:19

## 2018-06-17 NOTE — Telephone Encounter (Signed)
Returning pt's phone call reporting 'pain like a sunburn all over my body' since 5pm last night after taking Allopurinol.  Denies fever/chills or CP/SOB/difficulty swallowing.  Denies rash.  Denies N/V/D.  States he couldn't sleep last night d/t aching.  Coming in for [facility] visit, no labs.  Wife VU of no visitors policy.

## 2018-06-17 NOTE — Progress Notes (Signed)
Pt presents with 'burning like a full body sunburn' since last night.  Advised to take 25 mg PO benadryl and 2.5 mg Oxycodone by on call MD Sherrill last night over the phone.  Took this as well as other half of Oxycodone pill (5 mg total).  Reports some improvement in pain today.  A&Ox4.  No rash, fever/chills, trouble breathing or swallowing, N/V/D, changes in bowels/bladder, or dizziness/weakness.

## 2018-06-17 NOTE — Progress Notes (Signed)
[facility] Progress Note   Sean Stanley [id number] 06-Aug-1940 78 y.o.  KEZIAH DROTAR is managed by Dr. 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  This was added to his drug allergy list.  Drug-induced skin rash: Patient continues to have a erythematous rash over his trunk, upper extremities, and waist.  He was given a new prednisone taper.  Mantle cell lymphoma: The patient is status post cycle 1 of bendamustine and rituximab which was dosed on 06/01/2018.  He is scheduled to see Dr. Alvy Bimler in follow-up on 06/29/2018.  Please see After Visit Summary for patient specific instructions.  Future Appointments  Date Time Provider [place]  06/29/2018  9:45 AM [facility] LAB 2 [facility] None  06/29/2018 10:00 AM [facility] [place] FLUSH [facility] None  06/29/2018 10:30 AM Heath Lark, MD [facility] None  06/29/2018 11:30 AM [facility] INFUSION [facility] None  06/30/2018  9:00 AM [facility] INFUSION [facility] None  08/25/2018  1:30 PM Dohmeier, Asencion Partridge, MD [facility] None  11/20/2018  7:45 AM Hayden Pedro, MD [facility] None    No orders of the defined types were placed in this encounter.      Subjective:   Patient ID:  Sean Stanley is a 78 y.o. (DOB Oct 31, 1940) male.  Chief Complaint:  Chief Complaint  Patient presents with   Generalized Body Aches    HPI Sean Stanley  is a 78 year old male with a history of a mantle cell lymphoma who is managed by Dr. Heath Lark.  He is status post cycle 1 of bendamustine and  rituximab which was dosed on 06/01/2018.  He presented to our office yesterday with a report of dizziness, lightheadedness, and weakness in his legs.  He was found to be orthostatic with his labs showing dehydration.  He was told to push fluids.  He reports today that he is feeling better.  He took a dose of allopurinol last night at around 4 PM approximate 1/2 hours later he developed aching and burning all of his body.  He reported that it felt as though he had a sunburn.  He took 25 mg of Benadryl and one half of a oxycodone tablet.  He did not have relief.  He then took the other half of a tablet of oxycodone later in the evening.  He reports that his pain is around 5/10.  This was down from 10/10 last evening.  He was also found to have elevated blood pressure 172/95 which was felt to be secondary to his skin pain that he was feeling.  Medications: I have reviewed the patient's current medications.  Allergies:  Allergies  Allergen Reactions   Aspirin     Has been instructed not to take any blood thinners even aspirin due to history of several aneurysms    Levaquin [Levofloxacin In D5w]     tendonitis   Nickel Itching   Statins Other (See Comments)    Muscle aches   Allopurinol Rash   Ampicillin Rash    Did it involve swelling of the face/tongue/throat, SOB, or low BP? No Did it involve sudden or  severe rash/hives, skin peeling, or any reaction on the inside of your mouth or nose? Yes Did you need to seek medical attention at a hospital or doctor's office? Yes When did it last happen?50+ years If all above answers are NO, may proceed with cephalosporin use.     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 Maxwell Caul told him he had improved   Pneumonia    Thoracic ascending aortic aneurysm ([place])    4.5 cm 03/2018 CT    Past Surgical History:  Procedure Laterality Date   ABDOMINAL ANGIOGRAM  08/05/12   aneurysm repair     CARDIOVERSION  02/12/2012   Procedure: CARDIOVERSION;  Surgeon: Pixie Casino, MD;  Location: [facility] ENDOSCOPY;  Service: Cardiovascular;  Laterality: N/A;   CARDIOVERSION N/A 06/22/2013   Procedure: CARDIOVERSION;  Surgeon: [name], MD;  Location: [place];  Service: Cardiovascular;  Laterality: N/A;   CATARACT EXTRACTION Bilateral 2018   with lens implant   CHOLECYSTECTOMY     COLONOSCOPY     IR IMAGING GUIDED PORT INSERTION  05/26/2018   LYMPH NODE BIOPSY Left 04/27/2018   Procedure: EXCISIONAL BIOPSY OF LEFT CERVICAL LYMPH NODE;  Surgeon: Melida Quitter, MD;  Location: [place];  Service: ENT;  Laterality: Left;   MENISCUS REPAIR Right 2009   MITRAL VALVE REPAIR  2002   [facility]   NM MYOVIEW LTD  07/22/2006   no ischemia   RIGHT HEART CATH  06/19/2004   normal right heart dynamics. EF 50%   TEE WITHOUT CARDIOVERSION  02/12/2012   Procedure: TRANSESOPHAGEAL ECHOCARDIOGRAM (TEE);  Surgeon: Pixie Casino, MD;  Location: [facility] ENDOSCOPY;  Service: Cardiovascular;  Laterality: N/A;   TEE WITHOUT  CARDIOVERSION N/A 06/22/2013   Procedure: TRANSESOPHAGEAL ECHOCARDIOGRAM (TEE);  Surgeon: [name], MD;  Location: [facility] ENDOSCOPY;  Service: Cardiovascular;  Laterality: N/A;    Family History  Problem Relation Age of Onset   Parkinson's disease Mother 59   Heart failure Father 27   Cancer Maternal Grandmother    Heart attack Maternal Grandfather    Heart attack Paternal Grandmother    Heart attack Paternal Grandfather     Social History   Socioeconomic History   Marital status: Married    Spouse name: [name]   Number of children: 2   Years of education: Not on file   Highest education level: Not on file  Occupational History   Occupation: retired Retail banker strain: Not on [employer] insecurity:    Worry: Not on file    Inability: Not on Lexicographer needs:    Medical: Not on file    Non-medical: Not on file  Tobacco Use   Smoking status: Never Smoker   Smokeless tobacco: Never Used  Substance and Sexual Activity   Alcohol use: Yes    Comment: social   Drug use: No   Sexual activity: Not on file  Lifestyle   Physical activity:    Days per week: Not on file    Minutes per session: Not on file   Stress: Not on file  Relationships   Social connections:    Talks on phone: Not on file    Gets together: Not on file    Attends religious service: Not on file    Active member of club or organization: Not on file    Attends meetings of clubs or organizations: Not on file    Relationship status: Not on file   Intimate partner violence:    Fear of current or ex partner: Not on file    Emotionally abused: Not on file    Physically abused: Not on file    Forced sexual activity: Not on file  Other Topics Concern   Not on file  Social History Narrative   Lives in [place], Retired    Past Medical History, Surgical history, Social history, and Family history were reviewed and updated as appropriate.    Please see review of systems for further details on the patient's review from today.   Review of Systems:  Review of Systems  Constitutional: Negative for chills, diaphoresis and fever.  HENT: Negative for facial swelling and trouble swallowing.   Respiratory: Negative for cough, chest tightness and shortness of breath.   Cardiovascular: Negative for chest pain.  Skin: Positive for rash.       Diffuse erythematous rash over the trunk, waist, and bilateral upper extremities. Diffuse skin pain last evening.    Objective:   Physical Exam:  BP (!) 146/89 (BP Location: Right Arm, Patient Position: Sitting) Comment: nurse aware of bp   Pulse 84    Temp 97.8 F (36.6 C) (Oral)    Resp 18    Ht 6\' 1"  (1.854 m)    Wt 215 lb 11.2 oz (97.8 kg)    SpO2 98%    BMI 28.46 kg/m  ECOG: 0  Physical Exam Constitutional:      General: He is not in acute distress.    Appearance: Normal appearance. He is not ill-appearing, toxic-appearing or diaphoretic.  HENT:     Head: Normocephalic and atraumatic.  Musculoskeletal: Normal range of motion.  Skin:    General: Skin is warm and dry.     Findings: Erythema and rash present.     Comments: Diffuse erythematous rash over the trunk and arms.  Neurological:     Mental Status: He is alert.     Gait: Gait normal.  Psychiatric:        Mood and Affect: Mood normal.        Behavior: Behavior normal.        Thought Content: Thought content normal.        Judgment: Judgment normal.     Lab Review:     Component Value Date/Time   NA 138 06/16/2018 1253   K 4.2 06/16/2018 1253  CL 103 06/16/2018 1253   CO2 23 06/16/2018 1253   GLUCOSE 85 06/16/2018 1253   BUN 28 (H) 06/16/2018 1253   CREATININE 1.56 (H) 06/16/2018 1253   CALCIUM 9.0 06/16/2018 1253   PROT 7.0 06/16/2018 1253   PROT 6.6 07/23/2017 1504   ALBUMIN 4.0 06/16/2018 1253   AST 22 06/16/2018 1253   ALT 38 06/16/2018 1253   ALKPHOS 55 06/16/2018 1253   BILITOT 0.9 06/16/2018 1253    GFRNONAA 42 (L) 06/16/2018 1253   GFRAA 49 (L) 06/16/2018 1253       Component Value Date/Time   WBC 6.9 06/16/2018 1253   WBC 4.9 05/26/2018 0755   RBC 4.28 06/16/2018 1253   HGB 12.9 (L) 06/16/2018 1253   HCT 39.2 06/16/2018 1253   PLT 212 06/16/2018 1253   MCV 91.6 06/16/2018 1253   MCH 30.1 06/16/2018 1253   MCHC 32.9 06/16/2018 1253   RDW 14.0 06/16/2018 1253   LYMPHSABS 0.6 (L) 06/16/2018 1253   MONOABS 1.5 (H) 06/16/2018 1253   EOSABS 0.4 06/16/2018 1253   BASOSABS 0.1 06/16/2018 1253   -------------------------------  Imaging from last 24 hours (if applicable):  Radiology interpretation: Ir Imaging Guided Port Insertion  Result Date: 05/26/2018 CLINICAL DATA:  MANTLE CELL LYMPHOMA EXAM: RIGHT IJ SINGLE LUMEN POWER PORT CATHETER INSERTION Date:  05/26/2018 05/26/2018 11:13 am Radiologist:  M. Daryll Brod, MD Guidance:  Ultrasound and fluoroscopic MEDICATIONS: 1 g vancomycin; The antibiotic was administered within an appropriate time interval prior to skin puncture. ANESTHESIA/SEDATION: Versed 3.0 mg IV; Fentanyl 150 mcg IV; Moderate Sedation Time:  26 minutes The patient was continuously monitored during the procedure by the interventional radiology nurse under my direct supervision. FLUOROSCOPY TIME:  0 minutes, 36 seconds (3 mGy) COMPLICATIONS: None immediate. CONTRAST:  None. PROCEDURE: Informed consent was obtained from the patient following explanation of the procedure, risks, benefits and alternatives. The patient understands, agrees and consents for the procedure. All questions were addressed. A time out was performed. Maximal barrier sterile technique utilized including caps, mask, sterile gowns, sterile gloves, large sterile drape, hand hygiene, and 2% chlorhexidine scrub. Under sterile conditions and local anesthesia, right internal jugular micropuncture venous access was performed. Access was performed with ultrasound. Images were obtained for documentation of the patent  right internal jugular vein. A guide wire was inserted followed by a transitional dilator. This allowed insertion of a guide wire and catheter into the IVC. Measurements were obtained from the SVC / RA junction back to the right IJ venotomy site. In the right infraclavicular chest, a subcutaneous pocket was created over the second anterior rib. This was done under sterile conditions and local anesthesia. 1% lidocaine with epinephrine was utilized for this. A 2.5 cm incision was made in the skin. Blunt dissection was performed to create a subcutaneous pocket over the right pectoralis major muscle. The pocket was flushed with saline vigorously. There was adequate hemostasis. The port catheter was assembled and checked for leakage. The port catheter was secured in the pocket with two retention sutures. The tubing was tunneled subcutaneously to the right venotomy site and inserted into the SVC/RA junction through a valved peel-away sheath. Position was confirmed with fluoroscopy. Images were obtained for documentation. The patient tolerated the procedure well. No immediate complications. Incisions were closed in a two layer fashion with 4 - 0 Vicryl suture. Dermabond was applied to the skin. The port catheter was accessed, blood was aspirated followed by saline and heparin flushes. Needle was removed. A  dry sterile dressing was applied. IMPRESSION: Ultrasound and fluoroscopically guided right internal jugular single lumen power port catheter insertion. Tip in the SVC/RA junction. Catheter ready for use. Electronically Signed   By: Jerilynn Mages.  Shick M.D.   On: 05/26/2018 11:19        This case was discussed with Dr. Alvy Bimler. She expresses agreement with my management of this patient.

## 2018-06-18 ENCOUNTER — Telehealth: Payer: Self-pay | Admitting: Neurology

## 2018-06-18 ENCOUNTER — Other Ambulatory Visit: Payer: Self-pay | Admitting: Neurology

## 2018-06-18 MED ORDER — BUPROPION HCL ER (XL) 150 MG PO TB24: 150.0000 mg | ORAL_TABLET | Freq: Every day | ORAL | 1 refills | Status: DC

## 2018-06-18 NOTE — Telephone Encounter (Signed)
Sent 90 day supply to [facility] for the patient.

## 2018-06-18 NOTE — Telephone Encounter (Signed)
Pt is asking if a script for the buPROPion (WELLBUTRIN XL) 150 MG 24 hr tablet can be sent to  [place] for DOD please call

## 2018-06-19 ENCOUNTER — Telehealth: Payer: Self-pay

## 2018-06-19 NOTE — Telephone Encounter (Signed)
Pt called stating rash has cleared up and he is not sure if he should take the prescription for prednisone that was prescribed for the rash.  Per Lucianne Lei, [place] pt can hold off on taking this medication d/t rash has cleared.  Pt verbalizes understanding.

## 2018-06-29 ENCOUNTER — Inpatient Hospital Stay: Payer: Medicare Other | Attending: Hematology and Oncology

## 2018-06-29 ENCOUNTER — Inpatient Hospital Stay: Payer: Medicare Other

## 2018-06-29 ENCOUNTER — Other Ambulatory Visit: Payer: Self-pay

## 2018-06-29 ENCOUNTER — Encounter: Payer: Self-pay | Admitting: Hematology and Oncology

## 2018-06-29 ENCOUNTER — Telehealth: Payer: Self-pay | Admitting: Hematology and Oncology

## 2018-06-29 ENCOUNTER — Inpatient Hospital Stay (HOSPITAL_BASED_OUTPATIENT_CLINIC_OR_DEPARTMENT_OTHER): Payer: Medicare Other | Admitting: Hematology and Oncology

## 2018-06-29 VITALS — BP 118/86 | HR 70 | Temp 97.9°F | Resp 18 | Wt 214.8 lb

## 2018-06-29 DIAGNOSIS — C8318 Mantle cell lymphoma, lymph nodes of multiple sites: Secondary | ICD-10-CM

## 2018-06-29 DIAGNOSIS — T451X5A Adverse effect of antineoplastic and immunosuppressive drugs, initial encounter: Secondary | ICD-10-CM

## 2018-06-29 DIAGNOSIS — L270 Generalized skin eruption due to drugs and medicaments taken internally: Secondary | ICD-10-CM

## 2018-06-29 DIAGNOSIS — Z5111 Encounter for antineoplastic chemotherapy: Secondary | ICD-10-CM | POA: Diagnosis not present

## 2018-06-29 DIAGNOSIS — Z79899 Other long term (current) drug therapy: Secondary | ICD-10-CM | POA: Insufficient documentation

## 2018-06-29 DIAGNOSIS — K1231 Oral mucositis (ulcerative) due to antineoplastic therapy: Secondary | ICD-10-CM | POA: Diagnosis not present

## 2018-06-29 LAB — CMP (CANCER CENTER ONLY)
ALT: 22 U/L (ref 0–44)
AST: 18 U/L (ref 15–41)
Albumin: 3.9 g/dL (ref 3.5–5.0)
Alkaline Phosphatase: 52 U/L (ref 38–126)
Anion gap: 9 (ref 5–15)
BUN: 20 mg/dL (ref 8–23)
CO2: 26 mmol/L (ref 22–32)
Calcium: 9.1 mg/dL (ref 8.9–10.3)
Chloride: 105 mmol/L (ref 98–111)
Creatinine: 1.29 mg/dL — ABNORMAL HIGH (ref 0.61–1.24)
GFR, Est AFR Am: >60 mL/min (ref >60)
GFR, Estimated: 53 mL/min — ABNORMAL LOW (ref >60)
Glucose, Bld: 94 mg/dL (ref 70–99)
Potassium: 4 mmol/L (ref 3.5–5.1)
Sodium: 140 mmol/L (ref 135–145)
Total Bilirubin: 1.4 mg/dL — ABNORMAL HIGH (ref 0.3–1.2)
Total Protein: 6.6 g/dL (ref 6.5–8.1)

## 2018-06-29 LAB — CBC WITH DIFFERENTIAL (CANCER CENTER ONLY)
Abs Immature Granulocytes: 0.02 10*3/uL (ref 0.00–0.07)
Basophils Absolute: 0.1 10*3/uL (ref 0.0–0.1)
Basophils Relative: 1 %
Eosinophils Absolute: 0.3 10*3/uL (ref 0.0–0.5)
Eosinophils Relative: 5 %
HCT: 38.4 % — ABNORMAL LOW (ref 39.0–52.0)
Hemoglobin: 12.6 g/dL — ABNORMAL LOW (ref 13.0–17.0)
Immature Granulocytes: 0 %
Lymphocytes Relative: 8 %
Lymphs Abs: 0.4 10*3/uL — ABNORMAL LOW (ref 0.7–4.0)
MCH: 30.3 pg (ref 26.0–34.0)
MCHC: 32.8 g/dL (ref 30.0–36.0)
MCV: 92.3 fL (ref 80.0–100.0)
Monocytes Absolute: 0.8 10*3/uL (ref 0.1–1.0)
Monocytes Relative: 15 %
Neutro Abs: 3.5 10*3/uL (ref 1.7–7.7)
Neutrophils Relative %: 71 %
Platelet Count: 154 10*3/uL (ref 150–400)
RBC: 4.16 MIL/uL — ABNORMAL LOW (ref 4.22–5.81)
RDW: 13.8 % (ref 11.5–15.5)
WBC Count: 4.9 10*3/uL (ref 4.0–10.5)
nRBC: 0 % (ref 0.0–0.2)

## 2018-06-29 MED ORDER — DIPHENHYDRAMINE HCL 25 MG PO CAPS
50.0000 mg | ORAL_CAPSULE | Freq: Once | ORAL | Status: AC
  Administered 2018-06-29: 50 mg via ORAL

## 2018-06-29 MED ORDER — HEPARIN SOD (PORK) LOCK FLUSH 100 UNIT/ML IV SOLN
500.0000 [IU] | Freq: Once | INTRAVENOUS | Status: DC | PRN
  Filled 2018-06-29: qty 5

## 2018-06-29 MED ORDER — SODIUM CHLORIDE 0.9 % IV SOLN
Freq: Once | INTRAVENOUS | Status: AC
  Administered 2018-06-29: 11:00:00 via INTRAVENOUS
  Filled 2018-06-29: qty 250

## 2018-06-29 MED ORDER — DEXAMETHASONE SODIUM PHOSPHATE 10 MG/ML IJ SOLN
10.0000 mg | Freq: Once | INTRAMUSCULAR | Status: AC
  Administered 2018-06-29: 10 mg via INTRAVENOUS

## 2018-06-29 MED ORDER — PALONOSETRON HCL INJECTION 0.25 MG/5ML
INTRAVENOUS | Status: AC
  Filled 2018-06-29: qty 5

## 2018-06-29 MED ORDER — SODIUM CHLORIDE 0.9% FLUSH
10.0000 mL | Freq: Once | INTRAVENOUS | Status: AC
  Administered 2018-06-29: 10 mL
  Filled 2018-06-29: qty 10

## 2018-06-29 MED ORDER — ACETAMINOPHEN 325 MG PO TABS
650.0000 mg | ORAL_TABLET | Freq: Once | ORAL | Status: AC
  Administered 2018-06-29: 650 mg via ORAL

## 2018-06-29 MED ORDER — SODIUM CHLORIDE 0.9 % IV SOLN
90.0000 mg/m2 | Freq: Once | INTRAVENOUS | Status: AC
  Administered 2018-06-29: 200 mg via INTRAVENOUS
  Filled 2018-06-29: qty 8

## 2018-06-29 MED ORDER — DIPHENHYDRAMINE HCL 25 MG PO CAPS
ORAL_CAPSULE | ORAL | Status: AC
  Filled 2018-06-29: qty 2

## 2018-06-29 MED ORDER — DEXAMETHASONE SODIUM PHOSPHATE 10 MG/ML IJ SOLN
INTRAMUSCULAR | Status: AC
  Filled 2018-06-29: qty 1

## 2018-06-29 MED ORDER — PALONOSETRON HCL INJECTION 0.25 MG/5ML
0.2500 mg | Freq: Once | INTRAVENOUS | Status: AC
  Administered 2018-06-29: 0.25 mg via INTRAVENOUS

## 2018-06-29 MED ORDER — SODIUM CHLORIDE 0.9 % IV SOLN
375.0000 mg/m2 | Freq: Once | INTRAVENOUS | Status: AC
  Administered 2018-06-29: 13:00:00 900 mg via INTRAVENOUS
  Filled 2018-06-29: qty 50

## 2018-06-29 MED ORDER — ACETAMINOPHEN 325 MG PO TABS
ORAL_TABLET | ORAL | Status: AC
  Filled 2018-06-29: qty 2

## 2018-06-29 MED ORDER — SODIUM CHLORIDE 0.9% FLUSH
10.0000 mL | INTRAVENOUS | Status: DC | PRN
  Filled 2018-06-29: qty 10

## 2018-06-29 NOTE — Assessment & Plan Note (Signed)
Clinically, he has complete response to treatment on exam We will proceed with cycle 2 without dose adjustment He has recovered fully from all side effects of treatment I plan minimum 3 cycles of treatment before repeat imaging study

## 2018-06-29 NOTE — Telephone Encounter (Signed)
Scheduled per sch msg. Called and spoke with patients wife. Confirmed dates and times of next appt.

## 2018-06-29 NOTE — Assessment & Plan Note (Signed)
I suspected it could be related to allopurinol He has stopped taking the therapy

## 2018-06-29 NOTE — Progress Notes (Signed)
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   INTERVAL HISTORY: Please see below for problem oriented charting. 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 2. 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 3. Slight reduction in size of right retroperitoneal evolving hematoma.    03/30/2018 Imaging    Ct neck 1. Extensive adenopathy on the left. The largest node is a level 1/submandibular node measuring 38 x 24 x 22 mm. Largest level 2 node measures 3.2 x 2 x 2 cm. Numerous other smaller but round  lymph nodes throughout the left neck in the level 2 through level 4 region. The largest level 5 node measures 3.4 x 3.4 x 2.3 cm with a transverse diameter of 2.3 cm. This contains some internal calcifications. There are numerous other pathologic nodes in the left supraclavicular to axillary region. This pattern of disease could be due to extensive left neck metastatic disease from unknown primary, lymphoma, or other systemic malignancy. 2. No evidence of mucosal or submucosal lesion. 3. Diameter of the ascending aorta is 4.5 cm. Recommend annual imaging followup by CTA or MRA. Aortic Atherosclerosis (ICD10-I70.0).     04/10/2018 Pathology Results    Left zone 1 neck mass, [name], [name], the findings indicate an atypical lymphoid proliferation but are too limited for definitive diagnosis. Excisional biopsy with flow cytometry is recommended.    04/10/2018 Procedure    He was ENT who performed FNA of neck LN    04/27/2018 Pathology Results    Lymph node for lymphoma, Left zone 2 Cervical - MANTLE CELL LYMPHOMA - SEE COMMENT Microscopic Comment The biopsies have nodal architectural effacement by a monotonous lymphoid  population. The lymphocytes are predominantly small in size with round nuclei and mature, clumped chromatin. By immunohistochemistry the lymphocytes are B-cells with expression  of CD20, [name], [name], [name], [name], [name], the features are consistent with a mantle cell lymphoma    05/13/2018 PET scan    1. Hypermetabolic left cervical lymphadenopathy, left axillary lymphadenopathy, mediastinal / right hilar lymphadenopathy, right external iliac lymphadenopathy, and subcentimeter left external iliac and left inguinal lymph nodes, [name], [name], [name], without focal lesions, that may represent lymphomatous involvement of the spleen.    05/21/2018 Cancer Staging    Staging form: Hodgkin and Non-Hodgkin Lymphoma, AJCC 8th Edition - Clinical: Stage III - Signed by Heath Lark, MD on 05/21/2018    05/26/2018 Procedure    Ultrasound and fluoroscopically guided right internal jugular single lumen power port catheter insertion. Tip in the SVC/RA junction. Catheter ready for use.     06/01/2018 -  Chemotherapy    The patient had Bendamustine and Rituximab for chemotherapy treatment     REVIEW OF SYSTEMS:   Constitutional: Denies fevers, chills or abnormal weight loss Eyes: Denies blurriness of vision Respiratory: Denies cough, dyspnea or wheezes Cardiovascular: Denies palpitation, chest discomfort or lower extremity swelling Gastrointestinal:  Denies nausea, heartburn or change in bowel habits Lymphatics: Denies new lymphadenopathy or easy bruising Neurological:Denies numbness, tingling or new weaknesses Behavioral/Psych: Mood is stable, no new changes  All other systems were reviewed with the patient and are negative.  I have reviewed  the past medical history, past surgical history, social history and family history with the patient and they are unchanged from previous note.  ALLERGIES:  is allergic to aspirin; levaquin [levofloxacin in d5w]; nickel; statins; allopurinol; and ampicillin.  MEDICATIONS:  Current Outpatient Medications  Medication Sig Dispense Refill  . acetaminophen (TYLENOL) 500 MG tablet Take 1,000 mg by mouth daily as needed for moderate pain.    Marland Kitchen acyclovir (ZOVIRAX) 400 MG tablet Take 1 tablet (400 mg total) by mouth daily. 90 tablet 3  . alfuzosin (UROXATRAL) 10 MG 24 hr tablet Take 10 mg by mouth at bedtime.     Marland Kitchen buPROPion (WELLBUTRIN XL) 150 MG 24 hr tablet Take 1 tablet (150 mg total) by mouth daily. 90 tablet 1  . Cholecalciferol (VITAMIN D) 2000 units tablet Take 2,000 Units by mouth at bedtime.     Marland Kitchen ezetimibe (ZETIA) 10 MG tablet Take 10 mg by mouth at bedtime.     . finasteride (PROSCAR) 5 MG tablet Take 5 mg by mouth daily.     Marland Kitchen lidocaine-prilocaine (EMLA) cream Apply to affected area once 30 g 3  . metoprolol succinate (TOPROL-XL) 25 MG 24 hr tablet Take 12.5 mg by mouth daily.     . ondansetron (ZOFRAN) 8 MG tablet Take 1 tablet (8 mg total) by mouth every 8 (eight) hours as needed for refractory nausea / vomiting. 30 tablet 1  . Polyethyl Glycol-Propyl Glycol (SYSTANE OP) Place 1 drop into both eyes 2 (two) times daily.    . prochlorperazine (COMPAZINE) 10 MG tablet Take 1 tablet (10 mg total) by mouth every 6 (six) hours as  needed (Nausea or vomiting). 30 tablet 1   No current facility-administered medications for this visit.     PHYSICAL EXAMINATION: ECOG PERFORMANCE STATUS: 1 - Symptomatic but completely ambulatory  Vitals:   06/29/18 1031  BP: 122/65  Pulse: 71  Resp: 18  Temp: (!) 97.5 F (36.4 C)  SpO2: 98%   Filed Weights   06/29/18 1031  Weight: 215 lb (97.5 kg)    GENERAL:alert, no distress and comfortable SKIN: skin color, texture, turgor are normal, no rashes  or significant lesions EYES: normal, Conjunctiva are pink and non-injected, sclera clear OROPHARYNX:no exudate, no erythema and lips, buccal mucosa, and tongue normal  NECK: supple, thyroid normal size, non-tender, without nodularity LYMPH:  no palpable lymphadenopathy in the cervical, axillary or inguinal LUNGS: clear to auscultation and percussion with normal breathing effort HEART: regular rate & rhythm and no murmurs and no lower extremity edema ABDOMEN:abdomen soft, non-tender and normal bowel sounds Musculoskeletal:no cyanosis of digits and no clubbing  NEURO: alert & oriented x 3 with fluent speech, no focal motor/sensory deficits  LABORATORY DATA:  I have reviewed the data as listed    Component Value Date/Time   NA 140 06/29/2018 0940   K 4.0 06/29/2018 0940   CL 105 06/29/2018 0940   CO2 26 06/29/2018 0940   GLUCOSE 94 06/29/2018 0940   BUN 20 06/29/2018 0940   CREATININE 1.29 (H) 06/29/2018 0940   CALCIUM 9.1 06/29/2018 0940   PROT 6.6 06/29/2018 0940   PROT 6.6 07/23/2017 1504   ALBUMIN 3.9 06/29/2018 0940   AST 18 06/29/2018 0940   ALT 22 06/29/2018 0940   ALKPHOS 52 06/29/2018 0940   BILITOT 1.4 (H) 06/29/2018 0940   GFRNONAA 53 (L) 06/29/2018 0940   GFRAA >60 06/29/2018 0940    No results found for: SPEP, UPEP  Lab Results  Component Value Date   WBC 4.9 06/29/2018   NEUTROABS 3.5 06/29/2018   HGB 12.6 (L) 06/29/2018   HCT 38.4 (L) 06/29/2018   MCV 92.3 06/29/2018   PLT 154 06/29/2018      Chemistry      Component Value Date/Time   NA 140 06/29/2018 0940   K 4.0 06/29/2018 0940   CL 105 06/29/2018 0940   CO2 26 06/29/2018 0940   BUN 20 06/29/2018 0940   CREATININE 1.29 (H) 06/29/2018 0940      Component Value Date/Time   CALCIUM 9.1 06/29/2018 0940   ALKPHOS 52 06/29/2018 0940   AST 18 06/29/2018 0940   ALT 22 06/29/2018 0940   BILITOT 1.4 (H) 06/29/2018 0940       All questions were answered. The patient knows to call the clinic with  any problems, questions or concerns. No barriers to learning was detected.  I spent 15 minutes counseling the patient face to face. The total time spent in the appointment was 20 minutes and more than 50% was on counseling and review of test results  Heath Lark, MD 06/29/2018 11:00 AM

## 2018-06-29 NOTE — Patient Instructions (Signed)
[facility] Discharge Instructions for Patients Receiving Chemotherapy  Today you received the following chemotherapy agents Rituxan and Bendeka  To help prevent nausea and vomiting after your treatment, we encourage you to take your nausea medication as directed.  If you develop nausea and vomiting that is not controlled by your nausea medication, call the clinic.   BELOW ARE SYMPTOMS THAT SHOULD BE REPORTED IMMEDIATELY:  *FEVER GREATER THAN 100.5 F  *CHILLS WITH OR WITHOUT FEVER  NAUSEA AND VOMITING THAT IS NOT CONTROLLED WITH YOUR NAUSEA MEDICATION  *UNUSUAL SHORTNESS OF BREATH  *UNUSUAL BRUISING OR BLEEDING  TENDERNESS IN MOUTH AND THROAT WITH OR WITHOUT PRESENCE OF ULCERS  *URINARY PROBLEMS  *BOWEL PROBLEMS  UNUSUAL RASH Items with * indicate a potential emergency and should be followed up as soon as possible.  Feel free to call the clinic should you have any questions or concerns. The clinic phone number is [phone].  Please show the [facility] at check-in to the Emergency Department and triage nurse.  Rituximab injection What is this medicine? RITUXIMAB (ri TUX i mab) is a monoclonal antibody. It is used to treat certain types of cancer like non-Hodgkin lymphoma and chronic lymphocytic leukemia. It is also used to treat rheumatoid arthritis, granulomatosis with polyangiitis (or Wegener's granulomatosis), microscopic polyangiitis, and pemphigus vulgaris. This medicine may be used for other purposes; ask your health care provider or pharmacist if you have questions. COMMON BRAND NAME(S): Rituxan What should I tell my health care provider before I take this medicine? They need to know if you have any of these conditions: -heart disease -infection (especially a virus infection such as hepatitis B, chickenpox, cold sores, or herpes) -immune system problems -irregular heartbeat -kidney disease -low blood counts, like low white cell, platelet,  or red cell counts -lung or breathing disease, like asthma -recently received or scheduled to receive a vaccine -an unusual or allergic reaction to rituximab, other medicines, foods, dyes, or preservatives -pregnant or trying to get pregnant -breast-feeding How should I use this medicine? This medicine is for infusion into a vein. It is administered in a hospital or clinic by a specially trained health care professional. A special MedGuide will be given to you by the pharmacist with each prescription and refill. Be sure to read this information carefully each time. Talk to your pediatrician regarding the use of this medicine in children. This medicine is not approved for use in children. Overdosage: If you think you have taken too much of this medicine contact a poison control center or emergency room at once. NOTE: This medicine is only for you. Do not share this medicine with others. What if I miss a dose? It is important not to miss a dose. Call your doctor or health care professional if you are unable to keep an appointment. What may interact with this medicine? -cisplatin -live virus vaccines This list may not describe all possible interactions. Give your health care provider a list of all the medicines, herbs, non-prescription drugs, or dietary supplements you use. Also tell them if you smoke, drink alcohol, or use illegal drugs. Some items may interact with your medicine. What should I watch for while using this medicine? Your condition will be monitored carefully while you are receiving this medicine. You may need blood work done while you are taking this medicine. This medicine can cause serious allergic reactions. To reduce your risk you may need to take medicine before treatment with this medicine. Take your medicine as  directed. In some patients, this medicine may cause a serious brain infection that may cause death. If you have any problems seeing, thinking, speaking, walking, or  standing, tell your healthcare professional right away. If you cannot reach your healthcare professional, urgently seek other source of medical care. Call your doctor or health care professional for advice if you get a fever, chills or sore throat, or other symptoms of a cold or flu. Do not treat yourself. This drug decreases your body's ability to fight infections. Try to avoid being around people who are sick. Do not become pregnant while taking this medicine or for 12 months after stopping it. Women should inform their doctor if they wish to become pregnant or think they might be pregnant. There is a potential for serious side effects to an unborn child. Talk to your health care professional or pharmacist for more information. Do not breast-feed an infant while taking this medicine or for 6 months after stopping it. What side effects may I notice from receiving this medicine? Side effects that you should report to your doctor or health care professional as soon as possible: -allergic reactions like skin rash, itching or hives; swelling of the face, lips, or tongue -breathing problems -chest pain -changes in vision -diarrhea -headache with fever, neck stiffness, sensitivity to light, nausea, or confusion -fast, irregular heartbeat -loss of memory -low blood counts - this medicine may decrease the number of white blood cells, red blood cells and platelets. You may be at increased risk for infections and bleeding. -mouth sores -problems with balance, talking, or walking -redness, blistering, peeling or loosening of the skin, including inside the mouth -signs of infection - fever or chills, cough, sore throat, pain or difficulty passing urine -signs and symptoms of kidney injury like trouble passing urine or change in the amount of urine -signs and symptoms of liver injury like dark yellow or brown urine; general ill feeling or flu-like symptoms; light-colored stools; loss of appetite; nausea;  right upper belly pain; unusually weak or tired; yellowing of the eyes or skin -signs and symptoms of low blood pressure like dizziness; feeling faint or lightheaded, falls; unusually weak or tired -stomach pain -swelling of the ankles, feet, hands -unusual bleeding or bruising -vomiting Side effects that usually do not require medical attention (report to your doctor or health care professional if they continue or are bothersome): -headache -joint pain -muscle cramps or muscle pain -nausea -tiredness This list may not describe all possible side effects. Call your doctor for medical advice about side effects. You may report side effects to FDA at 1-800-FDA-1088. Where should I keep my medicine? This drug is given in a hospital or clinic and will not be stored at home. NOTE: This sheet is a summary. It may not cover all possible information. If you have questions about this medicine, talk to your doctor, pharmacist, or health care provider.  2019 Elsevier/Gold Standard (2017-02-21 13:04:32)  Bendamustine Injection What is this medicine? BENDAMUSTINE (BEN da MUS teen) is a chemotherapy drug. It is used to treat chronic lymphocytic leukemia and non-Hodgkin lymphoma. This medicine may be used for other purposes; ask your health care provider or pharmacist if you have questions. COMMON BRAND NAME(S): BENDEKA, Treanda What should I tell my health care provider before I take this medicine? They need to know if you have any of these conditions: -infection (especially a virus infection such as chickenpox, cold sores, or herpes) -kidney disease -liver disease -an unusual or allergic reaction to bendamustine,  mannitol, other medicines, foods, dyes, or preservatives -pregnant or trying to get pregnant -breast-feeding How should I use this medicine? This medicine is for infusion into a vein. It is given by a health care professional in a hospital or clinic setting. Talk to your pediatrician  regarding the use of this medicine in children. Special care may be needed. Overdosage: If you think you have taken too much of this medicine contact a poison control center or emergency room at once. NOTE: This medicine is only for you. Do not share this medicine with others. What if I miss a dose? It is important not to miss your dose. Call your doctor or health care professional if you are unable to keep an appointment. What may interact with this medicine? Do not take this medicine with any of the following medications: -clozapine This medicine may also interact with the following medications: -atazanavir -cimetidine -ciprofloxacin -enoxacin -fluvoxamine -medicines for seizures like carbamazepine and phenobarbital -mexiletine -rifampin -tacrine -thiabendazole -zileuton This list may not describe all possible interactions. Give your health care provider a list of all the medicines, herbs, non-prescription drugs, or dietary supplements you use. Also tell them if you smoke, drink alcohol, or use illegal drugs. Some items may interact with your medicine. What should I watch for while using this medicine? This drug may make you feel generally unwell. This is not uncommon, as chemotherapy can affect healthy cells as well as cancer cells. Report any side effects. Continue your course of treatment even though you feel ill unless your doctor tells you to stop. You may need blood work done while you are taking this medicine. Call your doctor or health care professional for advice if you get a fever, chills or sore throat, or other symptoms of a cold or flu. Do not treat yourself. This drug decreases your body's ability to fight infections. Try to avoid being around people who are sick. This medicine may increase your risk to bruise or bleed. Call your doctor or health care professional if you notice any unusual bleeding. Talk to your doctor about your risk of cancer. You may be more at risk for  certain types of cancers if you take this medicine. Do not become pregnant while taking this medicine or for 3 months after stopping it. Women should inform their doctor if they wish to become pregnant or think they might be pregnant. Men should not father a child while taking this medicine and for 3 months after stopping it.There is a potential for serious side effects to an unborn child. Talk to your health care professional or pharmacist for more information. Do not breast-feed an infant while taking this medicine. This medicine may interfere with the ability to have a child. You should talk with your doctor or health care professional if you are concerned about your fertility. What side effects may I notice from receiving this medicine? Side effects that you should report to your doctor or health care professional as soon as possible: -allergic reactions like skin rash, itching or hives, swelling of the face, lips, or tongue -low blood counts - this medicine may decrease the number of white blood cells, red blood cells and platelets. You may be at increased risk for infections and bleeding. -redness, blistering, peeling or loosening of the skin, including inside the mouth -signs of infection - fever or chills, cough, sore throat, pain or difficulty passing urine -signs of decreased platelets or bleeding - bruising, pinpoint red spots on the skin, black, tarry stools,  blood in the urine -signs of decreased red blood cells - unusually weak or tired, fainting spells, lightheadedness -signs and symptoms of kidney injury like trouble passing urine or change in the amount of urine -signs and symptoms of liver injury like dark yellow or brown urine; general ill feeling or flu-like symptoms; light-colored stools; loss of appetite; nausea; right upper belly pain; unusually weak or tired; yellowing of the eyes or skin Side effects that usually do not require medical attention (report to your doctor or health  care professional if they continue or are bothersome): -constipation -decreased appetite -diarrhea -headache -mouth sores -nausea/vomiting -tiredness This list may not describe all possible side effects. Call your doctor for medical advice about side effects. You may report side effects to FDA at 1-800-FDA-1088. Where should I keep my medicine? This drug is given in a hospital or clinic and will not be stored at home. NOTE: This sheet is a summary. It may not cover all possible information. If you have questions about this medicine, talk to your doctor, pharmacist, or health care provider.  2019 Elsevier/Gold Standard (2015-01-12 08:45:41)

## 2018-06-29 NOTE — Assessment & Plan Note (Signed)
He has mild mucositis which has resolved completely I discussed conservative approach If the mucositis recur and it is worse, he might need to be treated in the future

## 2018-06-30 ENCOUNTER — Inpatient Hospital Stay: Payer: Medicare Other

## 2018-06-30 ENCOUNTER — Other Ambulatory Visit: Payer: Self-pay

## 2018-06-30 VITALS — BP 124/77 | HR 73 | Temp 97.7°F | Resp 18

## 2018-06-30 DIAGNOSIS — C8318 Mantle cell lymphoma, lymph nodes of multiple sites: Secondary | ICD-10-CM | POA: Diagnosis not present

## 2018-06-30 DIAGNOSIS — L270 Generalized skin eruption due to drugs and medicaments taken internally: Secondary | ICD-10-CM | POA: Diagnosis not present

## 2018-06-30 DIAGNOSIS — K1231 Oral mucositis (ulcerative) due to antineoplastic therapy: Secondary | ICD-10-CM | POA: Diagnosis not present

## 2018-06-30 DIAGNOSIS — Z5111 Encounter for antineoplastic chemotherapy: Secondary | ICD-10-CM | POA: Diagnosis not present

## 2018-06-30 DIAGNOSIS — T451X5A Adverse effect of antineoplastic and immunosuppressive drugs, initial encounter: Secondary | ICD-10-CM | POA: Diagnosis not present

## 2018-06-30 DIAGNOSIS — Z79899 Other long term (current) drug therapy: Secondary | ICD-10-CM | POA: Diagnosis not present

## 2018-06-30 MED ORDER — DEXAMETHASONE SODIUM PHOSPHATE 10 MG/ML IJ SOLN
10.0000 mg | Freq: Once | INTRAMUSCULAR | Status: AC
  Administered 2018-06-30: 10 mg via INTRAVENOUS

## 2018-06-30 MED ORDER — HEPARIN SOD (PORK) LOCK FLUSH 100 UNIT/ML IV SOLN
500.0000 [IU] | Freq: Once | INTRAVENOUS | Status: AC | PRN
  Administered 2018-06-30: 500 [IU]
  Filled 2018-06-30: qty 5

## 2018-06-30 MED ORDER — SODIUM CHLORIDE 0.9 % IV SOLN
Freq: Once | INTRAVENOUS | Status: AC
  Administered 2018-06-30: 09:00:00 via INTRAVENOUS
  Filled 2018-06-30: qty 250

## 2018-06-30 MED ORDER — SODIUM CHLORIDE 0.9 % IV SOLN
90.0000 mg/m2 | Freq: Once | INTRAVENOUS | Status: AC
  Administered 2018-06-30: 200 mg via INTRAVENOUS
  Filled 2018-06-30: qty 8

## 2018-06-30 MED ORDER — DEXAMETHASONE SODIUM PHOSPHATE 10 MG/ML IJ SOLN
INTRAMUSCULAR | Status: AC
  Filled 2018-06-30: qty 1

## 2018-06-30 MED ORDER — SODIUM CHLORIDE 0.9% FLUSH
10.0000 mL | INTRAVENOUS | Status: DC | PRN
  Administered 2018-06-30: 10 mL
  Filled 2018-06-30: qty 10

## 2018-06-30 NOTE — Patient Instructions (Signed)
[facility] Discharge Instructions for Patients Receiving Chemotherapy  Today you received the following chemotherapy agents Bendeka.  To help prevent nausea and vomiting after your treatment, we encourage you to take your nausea medication as directed.   If you develop nausea and vomiting that is not controlled by your nausea medication, call the clinic.   BELOW ARE SYMPTOMS THAT SHOULD BE REPORTED IMMEDIATELY:  *FEVER GREATER THAN 100.5 F  *CHILLS WITH OR WITHOUT FEVER  NAUSEA AND VOMITING THAT IS NOT CONTROLLED WITH YOUR NAUSEA MEDICATION  *UNUSUAL SHORTNESS OF BREATH  *UNUSUAL BRUISING OR BLEEDING  TENDERNESS IN MOUTH AND THROAT WITH OR WITHOUT PRESENCE OF ULCERS  *URINARY PROBLEMS  *BOWEL PROBLEMS  UNUSUAL RASH Items with * indicate a potential emergency and should be followed up as soon as possible.  Feel free to call the clinic should you have any questions or concerns. The clinic phone number is [phone].  Please show the CHEMO ALERT CARD at check-in to the Emergency Department and triage nurse.   

## 2018-07-15 DIAGNOSIS — I483 Typical atrial flutter: Secondary | ICD-10-CM | POA: Diagnosis not present

## 2018-07-28 ENCOUNTER — Inpatient Hospital Stay: Payer: Medicare Other

## 2018-07-28 ENCOUNTER — Inpatient Hospital Stay: Payer: Medicare Other | Attending: Hematology and Oncology | Admitting: Hematology and Oncology

## 2018-07-28 ENCOUNTER — Encounter: Payer: Self-pay | Admitting: Hematology and Oncology

## 2018-07-28 ENCOUNTER — Other Ambulatory Visit: Payer: Self-pay

## 2018-07-28 VITALS — BP 142/87 | HR 63 | Temp 97.9°F | Resp 18

## 2018-07-28 DIAGNOSIS — G629 Polyneuropathy, unspecified: Secondary | ICD-10-CM | POA: Diagnosis not present

## 2018-07-28 DIAGNOSIS — Z5111 Encounter for antineoplastic chemotherapy: Secondary | ICD-10-CM | POA: Diagnosis not present

## 2018-07-28 DIAGNOSIS — D61818 Other pancytopenia: Secondary | ICD-10-CM | POA: Insufficient documentation

## 2018-07-28 DIAGNOSIS — Z79899 Other long term (current) drug therapy: Secondary | ICD-10-CM | POA: Insufficient documentation

## 2018-07-28 DIAGNOSIS — C8318 Mantle cell lymphoma, lymph nodes of multiple sites: Secondary | ICD-10-CM | POA: Diagnosis not present

## 2018-07-28 LAB — CBC WITH DIFFERENTIAL (CANCER CENTER ONLY)
Abs Immature Granulocytes: 0.02 10*3/uL (ref 0.00–0.07)
Basophils Absolute: 0 10*3/uL (ref 0.0–0.1)
Basophils Relative: 1 %
Eosinophils Absolute: 0.2 10*3/uL (ref 0.0–0.5)
Eosinophils Relative: 6 %
HCT: 38 % — ABNORMAL LOW (ref 39.0–52.0)
Hemoglobin: 12.7 g/dL — ABNORMAL LOW (ref 13.0–17.0)
Immature Granulocytes: 1 %
Lymphocytes Relative: 6 %
Lymphs Abs: 0.2 10*3/uL — ABNORMAL LOW (ref 0.7–4.0)
MCH: 30.2 pg (ref 26.0–34.0)
MCHC: 33.4 g/dL (ref 30.0–36.0)
MCV: 90.5 fL (ref 80.0–100.0)
Monocytes Absolute: 0.7 10*3/uL (ref 0.1–1.0)
Monocytes Relative: 18 %
Neutro Abs: 2.6 10*3/uL (ref 1.7–7.7)
Neutrophils Relative %: 68 %
Platelet Count: 151 10*3/uL (ref 150–400)
RBC: 4.2 MIL/uL — ABNORMAL LOW (ref 4.22–5.81)
RDW: 13.3 % (ref 11.5–15.5)
WBC Count: 3.7 10*3/uL — ABNORMAL LOW (ref 4.0–10.5)
nRBC: 0 % (ref 0.0–0.2)

## 2018-07-28 LAB — CMP (CANCER CENTER ONLY)
ALT: 22 U/L (ref 0–44)
AST: 18 U/L (ref 15–41)
Albumin: 3.9 g/dL (ref 3.5–5.0)
Alkaline Phosphatase: 49 U/L (ref 38–126)
Anion gap: 9 (ref 5–15)
BUN: 21 mg/dL (ref 8–23)
CO2: 25 mmol/L (ref 22–32)
Calcium: 9 mg/dL (ref 8.9–10.3)
Chloride: 107 mmol/L (ref 98–111)
Creatinine: 1.24 mg/dL (ref 0.61–1.24)
GFR, Est AFR Am: >60 mL/min (ref >60)
GFR, Estimated: 56 mL/min — ABNORMAL LOW (ref >60)
Glucose, Bld: 83 mg/dL (ref 70–99)
Potassium: 4.2 mmol/L (ref 3.5–5.1)
Sodium: 141 mmol/L (ref 135–145)
Total Bilirubin: 1.1 mg/dL (ref 0.3–1.2)
Total Protein: 6.4 g/dL — ABNORMAL LOW (ref 6.5–8.1)

## 2018-07-28 MED ORDER — DIPHENHYDRAMINE HCL 25 MG PO CAPS
50.0000 mg | ORAL_CAPSULE | Freq: Once | ORAL | Status: AC
  Administered 2018-07-28: 09:00:00 50 mg via ORAL

## 2018-07-28 MED ORDER — ACETAMINOPHEN 325 MG PO TABS
650.0000 mg | ORAL_TABLET | Freq: Once | ORAL | Status: AC
  Administered 2018-07-28: 650 mg via ORAL

## 2018-07-28 MED ORDER — SODIUM CHLORIDE 0.9 % IV SOLN
90.0000 mg/m2 | Freq: Once | INTRAVENOUS | Status: AC
  Administered 2018-07-28: 200 mg via INTRAVENOUS
  Filled 2018-07-28: qty 8

## 2018-07-28 MED ORDER — PALONOSETRON HCL INJECTION 0.25 MG/5ML
INTRAVENOUS | Status: AC
  Filled 2018-07-28: qty 5

## 2018-07-28 MED ORDER — PALONOSETRON HCL INJECTION 0.25 MG/5ML
0.2500 mg | Freq: Once | INTRAVENOUS | Status: AC
  Administered 2018-07-28: 0.25 mg via INTRAVENOUS

## 2018-07-28 MED ORDER — DIPHENHYDRAMINE HCL 25 MG PO CAPS
ORAL_CAPSULE | ORAL | Status: AC
  Filled 2018-07-28: qty 2

## 2018-07-28 MED ORDER — SODIUM CHLORIDE 0.9 % IV SOLN
375.0000 mg/m2 | Freq: Once | INTRAVENOUS | Status: AC
  Administered 2018-07-28: 900 mg via INTRAVENOUS
  Filled 2018-07-28: qty 40

## 2018-07-28 MED ORDER — DEXAMETHASONE SODIUM PHOSPHATE 10 MG/ML IJ SOLN
INTRAMUSCULAR | Status: AC
  Filled 2018-07-28: qty 1

## 2018-07-28 MED ORDER — SODIUM CHLORIDE 0.9% FLUSH
10.0000 mL | Freq: Once | INTRAVENOUS | Status: AC
  Administered 2018-07-28: 10 mL
  Filled 2018-07-28: qty 10

## 2018-07-28 MED ORDER — SODIUM CHLORIDE 0.9% FLUSH
10.0000 mL | INTRAVENOUS | Status: DC | PRN
  Administered 2018-07-28: 10 mL
  Filled 2018-07-28: qty 10

## 2018-07-28 MED ORDER — ACETAMINOPHEN 325 MG PO TABS
ORAL_TABLET | ORAL | Status: AC
  Filled 2018-07-28: qty 2

## 2018-07-28 MED ORDER — HEPARIN SOD (PORK) LOCK FLUSH 100 UNIT/ML IV SOLN
500.0000 [IU] | Freq: Once | INTRAVENOUS | Status: AC | PRN
  Administered 2018-07-28: 500 [IU]
  Filled 2018-07-28: qty 5

## 2018-07-28 MED ORDER — SODIUM CHLORIDE 0.9 % IV SOLN
Freq: Once | INTRAVENOUS | Status: AC
  Administered 2018-07-28: 09:00:00 via INTRAVENOUS
  Filled 2018-07-28: qty 250

## 2018-07-28 MED ORDER — DEXAMETHASONE SODIUM PHOSPHATE 10 MG/ML IJ SOLN
10.0000 mg | Freq: Once | INTRAMUSCULAR | Status: AC
  Administered 2018-07-28: 10 mg via INTRAVENOUS

## 2018-07-28 NOTE — Assessment & Plan Note (Signed)
He tolerated treatment well except for intermittent abdominal tightness that comes and goes He has no palpable lymphadenopathy today I recommend we proceed with cycle 3 without dose change I plan to order imaging study before I see him for cycle 4 of therapy

## 2018-07-28 NOTE — Progress Notes (Signed)
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 NM PET Image Restag (PS) Skull Base To Thigh    Standing Status:   Future    Standing Expiration Date:   07/28/2019    Order Specific Question:   If indicated for the ordered procedure, I authorize the administration of a radiopharmaceutical per Radiology protocol    Answer:   Yes    Order Specific Question:   Preferred imaging location?    Answer:   [facility]    Order Specific Question:   Radiology Contrast Protocol - do NOT remove file path    Answer:   [email or web address]    INTERVAL HISTORY: Please see below for problem oriented charting. He returns for cycle 3 of chemotherapy His wife, [name], is on the phone He has intermittent abdominal discomfort/tightness that comes and goes It does not affect his activities of daily living and not influenced by activity or bowel habits He has some very minor constipation for the first few days after treatment but that resolved with laxatives No recurrence of night sweats, lymphadenopathy or skin rashes He felt that his pre-existing neuropathy is slightly improved  SUMMARY OF  ONCOLOGIC HISTORY: Oncology History   MIPI score 7.5 high risk     Mantle cell lymphoma ([place])   06/13/2017 Imaging    Ct imaging at [facility] 1. 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 2. 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 3. Slight reduction in size of right retroperitoneal evolving hematoma.    03/30/2018 Imaging    Ct neck 1. Extensive adenopathy on the left. The largest node is a level 1/submandibular node measuring 38 x 24 x 22 mm. Largest level 2 node measures 3.2 x 2 x 2 cm. Numerous other smaller but round lymph nodes throughout the left neck in the level 2 through level 4 region. The largest level 5 node measures 3.4 x 3.4 x 2.3 cm with a transverse diameter of 2.3 cm. This contains some internal calcifications. There are numerous other pathologic nodes in the left supraclavicular to axillary region. This pattern of disease could be due to extensive left neck metastatic disease from unknown primary, lymphoma, or other systemic malignancy. 2. No evidence of mucosal or submucosal lesion. 3. Diameter of the ascending aorta is 4.5 cm. Recommend annual imaging followup by CTA or MRA. Aortic Atherosclerosis (ICD10-I70.0).     04/10/2018 Pathology Results    Left zone 1 neck mass, Fine Needle Aspiration I (smears and cell block): Atypical lymphoid proliferation. See comment.  Specimen Adequacy: Satisfactory for evaluation.  COMMENT:The aspirate demonstrates abundant small to medium sized lymphocytes with scattered epithelioid  cells in the background which may be histiocytes. The smears are cellular, but the cell block includes scant lymphocytes. Immunohistochemical stains were attempted showing positive staining for CD20 with rare staining for CD3. Cytokeratin AE1/AE3 is negative. S100 shows high  nonspecific background staining but no specifically diagnostic cellular staining. Overall, the findings indicate an atypical lymphoid proliferation but are too limited for definitive diagnosis. Excisional biopsy with flow cytometry is recommended.    04/10/2018 Procedure    He was ENT who performed FNA of neck LN    04/27/2018 Pathology Results    Lymph node for lymphoma, [name], [name], [name], [name], [name], [name], [name], the features are consistent with a mantle cell lymphoma    05/13/2018 PET scan    1. Hypermetabolic left cervical lymphadenopathy, left axillary lymphadenopathy, mediastinal / right hilar lymphadenopathy, right external iliac lymphadenopathy, and subcentimeter left external iliac and left inguinal lymph nodes, [name], [name], [name], without focal lesions, that may represent lymphomatous involvement of the spleen.    05/21/2018 Cancer Staging    Staging form: Hodgkin and Non-Hodgkin Lymphoma, AJCC 8th Edition - Clinical: Stage III - Signed by Heath Lark, MD on 05/21/2018    05/26/2018 Procedure    Ultrasound and fluoroscopically guided right internal jugular single lumen power port catheter insertion. Tip in the SVC/RA  junction. Catheter ready for use.     06/01/2018 -  Chemotherapy    The patient had Bendamustine and Rituximab for chemotherapy treatment     REVIEW OF SYSTEMS:   Constitutional: Denies fevers, chills or abnormal weight loss Eyes: Denies blurriness of vision Ears, nose, mouth, throat, and face: Denies mucositis or sore throat Respiratory: Denies cough, dyspnea or wheezes Cardiovascular: Denies palpitation, chest discomfort or lower extremity swelling Skin: Denies abnormal skin rashes Lymphatics: Denies new lymphadenopathy or easy bruising Neurological:Denies numbness, tingling or new weaknesses Behavioral/Psych: Mood is stable, no new changes  All other systems were reviewed with the patient and are negative.  I have reviewed the past medical history, past surgical history, social history and family history with the patient and they are unchanged from previous note.  ALLERGIES:  is allergic to aspirin; levaquin [levofloxacin in d5w]; nickel; statins; allopurinol; and ampicillin.  MEDICATIONS:  Current Outpatient Medications  Medication Sig Dispense Refill  . acetaminophen (TYLENOL) 500 MG tablet Take 1,000 mg by mouth daily as needed for moderate pain.    Marland Kitchen acyclovir (ZOVIRAX) 400 MG tablet Take 1 tablet (400 mg total) by mouth daily. 90 tablet 3  . alfuzosin (UROXATRAL) 10 MG 24 hr tablet Take 10 mg by mouth at bedtime.     Marland Kitchen buPROPion (WELLBUTRIN XL) 150 MG 24 hr tablet Take 1 tablet (150 mg total) by mouth daily. 90 tablet 1  . Cholecalciferol (VITAMIN D) 2000 units tablet Take 2,000 Units by mouth at bedtime.     . finasteride (  PROSCAR) 5 MG tablet Take 5 mg by mouth daily.     Marland Kitchen lidocaine-prilocaine (EMLA) cream Apply to affected area once 30 g 3  . metoprolol succinate (TOPROL-XL) 25 MG 24 hr tablet Take 12.5 mg by mouth daily.     . ondansetron (ZOFRAN) 8 MG tablet Take 1 tablet (8 mg total) by mouth every 8 (eight) hours as needed for refractory nausea / vomiting. 30 tablet  1  . Polyethyl Glycol-Propyl Glycol (SYSTANE OP) Place 1 drop into both eyes 2 (two) times daily.    . prochlorperazine (COMPAZINE) 10 MG tablet Take 1 tablet (10 mg total) by mouth every 6 (six) hours as needed (Nausea or vomiting). 30 tablet 1   No current facility-administered medications for this visit.    Facility-Administered Medications Ordered in Other Visits  Medication Dose Route Frequency Provider Last Rate Last Dose  . acetaminophen (TYLENOL) tablet 650 mg  650 mg Oral Once Alvy Bimler, Tsuyako Jolley, MD      . bendamustine (BENDEKA) 200 mg in sodium chloride 0.9 % 50 mL (3.4483 mg/mL) chemo infusion  90 mg/m2 (Treatment Plan Recorded) Intravenous Once Alvy Bimler, Braylei Totino, MD      . dexamethasone (DECADRON) injection 10 mg  10 mg Intravenous Once Alvy Bimler, Ayce Pietrzyk, MD      . diphenhydrAMINE (BENADRYL) capsule 50 mg  50 mg Oral Once Alvy Bimler, Raheen Capili, MD      . heparin lock flush 100 unit/mL  500 Units Intracatheter Once PRN Alvy Bimler, Alysson Geist, MD      . palonosetron (ALOXI) injection 0.25 mg  0.25 mg Intravenous Once Jaimeson Gopal, MD      . riTUXimab (RITUXAN) 900 mg in sodium chloride 0.9 % 160 mL infusion  375 mg/m2 (Treatment Plan Recorded) Intravenous Once Cerena Baine, MD      . sodium chloride flush (NS) 0.9 % injection 10 mL  10 mL Intracatheter PRN Alvy Bimler, Renella Steig, MD        PHYSICAL EXAMINATION: ECOG PERFORMANCE STATUS: 1 - Symptomatic but completely ambulatory  Vitals:   07/28/18 0844  BP: 133/71  Pulse: 63  Resp: 18  Temp: 97.9 F (36.6 C)  SpO2: 98%   Filed Weights   07/28/18 0844  Weight: 216 lb 12.8 oz (98.3 kg)    GENERAL:alert, no distress and comfortable SKIN: skin color, texture, turgor are normal, no rashes or significant lesions EYES: normal, Conjunctiva are pink and non-injected, sclera clear OROPHARYNX:no exudate, no erythema and lips, buccal mucosa, and tongue normal  NECK: supple, thyroid normal size, non-tender, without nodularity LYMPH:  no palpable lymphadenopathy in the cervical,  axillary or inguinal LUNGS: clear to auscultation and percussion with normal breathing effort HEART: regular rate & rhythm and no murmurs and no lower extremity edema ABDOMEN:abdomen soft, non-tender and normal bowel sounds Musculoskeletal:no cyanosis of digits and no clubbing  NEURO: alert & oriented x 3 with fluent speech, no focal motor/sensory deficits  LABORATORY DATA:  I have reviewed the data as listed    Component Value Date/Time   NA 141 07/28/2018 0829   K 4.2 07/28/2018 0829   CL 107 07/28/2018 0829   CO2 25 07/28/2018 0829   GLUCOSE 83 07/28/2018 0829   BUN 21 07/28/2018 0829   CREATININE 1.24 07/28/2018 0829   CALCIUM 9.0 07/28/2018 0829   PROT 6.4 (L) 07/28/2018 0829   PROT 6.6 07/23/2017 1504   ALBUMIN 3.9 07/28/2018 0829   AST 18 07/28/2018 0829   ALT 22 07/28/2018 0829   ALKPHOS 49 07/28/2018 0829   BILITOT  1.1 07/28/2018 0829   GFRNONAA 56 (L) 07/28/2018 0829   GFRAA >60 07/28/2018 0829    No results found for: SPEP, UPEP  Lab Results  Component Value Date   WBC 3.7 (L) 07/28/2018   NEUTROABS 2.6 07/28/2018   HGB 12.7 (L) 07/28/2018   HCT 38.0 (L) 07/28/2018   MCV 90.5 07/28/2018   PLT 151 07/28/2018      Chemistry      Component Value Date/Time   NA 141 07/28/2018 0829   K 4.2 07/28/2018 0829   CL 107 07/28/2018 0829   CO2 25 07/28/2018 0829   BUN 21 07/28/2018 0829   CREATININE 1.24 07/28/2018 0829      Component Value Date/Time   CALCIUM 9.0 07/28/2018 0829   ALKPHOS 49 07/28/2018 0829   AST 18 07/28/2018 0829   ALT 22 07/28/2018 0829   BILITOT 1.1 07/28/2018 0829       All questions were answered. The patient knows to call the clinic with any problems, questions or concerns. No barriers to learning was detected.  I spent 15 minutes counseling the patient face to face. The total time spent in the appointment was 20 minutes and more than 50% was on counseling and review of test results  Heath Lark, MD 07/28/2018 9:24 AM

## 2018-07-28 NOTE — Assessment & Plan Note (Signed)
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

## 2018-07-28 NOTE — Assessment & Plan Note (Signed)
He has mild pancytopenia from treatment but not symptomatic We discussed neutropenic precaution

## 2018-07-28 NOTE — Patient Instructions (Signed)
[facility] Discharge Instructions for Patients Receiving Chemotherapy  Today you received the following chemotherapy agents Rituxan and Bendeka  To help prevent nausea and vomiting after your treatment, we encourage you to take your nausea medication as directed.  If you develop nausea and vomiting that is not controlled by your nausea medication, call the clinic.   BELOW ARE SYMPTOMS THAT SHOULD BE REPORTED IMMEDIATELY:  *FEVER GREATER THAN 100.5 F  *CHILLS WITH OR WITHOUT FEVER  NAUSEA AND VOMITING THAT IS NOT CONTROLLED WITH YOUR NAUSEA MEDICATION  *UNUSUAL SHORTNESS OF BREATH  *UNUSUAL BRUISING OR BLEEDING  TENDERNESS IN MOUTH AND THROAT WITH OR WITHOUT PRESENCE OF ULCERS  *URINARY PROBLEMS  *BOWEL PROBLEMS  UNUSUAL RASH Items with * indicate a potential emergency and should be followed up as soon as possible.  Feel free to call the clinic should you have any questions or concerns. The clinic phone number is [phone].  Please show the [facility] at check-in to the Emergency Department and triage nurse.  Rituximab injection What is this medicine? RITUXIMAB (ri TUX i mab) is a monoclonal antibody. It is used to treat certain types of cancer like non-Hodgkin lymphoma and chronic lymphocytic leukemia. It is also used to treat rheumatoid arthritis, granulomatosis with polyangiitis (or Wegener's granulomatosis), microscopic polyangiitis, and pemphigus vulgaris. This medicine may be used for other purposes; ask your health care provider or pharmacist if you have questions. COMMON BRAND NAME(S): Rituxan What should I tell my health care provider before I take this medicine? They need to know if you have any of these conditions: -heart disease -infection (especially a virus infection such as hepatitis B, chickenpox, cold sores, or herpes) -immune system problems -irregular heartbeat -kidney disease -low blood counts, like low white cell, platelet,  or red cell counts -lung or breathing disease, like asthma -recently received or scheduled to receive a vaccine -an unusual or allergic reaction to rituximab, other medicines, foods, dyes, or preservatives -pregnant or trying to get pregnant -breast-feeding How should I use this medicine? This medicine is for infusion into a vein. It is administered in a hospital or clinic by a specially trained health care professional. A special MedGuide will be given to you by the pharmacist with each prescription and refill. Be sure to read this information carefully each time. Talk to your pediatrician regarding the use of this medicine in children. This medicine is not approved for use in children. Overdosage: If you think you have taken too much of this medicine contact a poison control center or emergency room at once. NOTE: This medicine is only for you. Do not share this medicine with others. What if I miss a dose? It is important not to miss a dose. Call your doctor or health care professional if you are unable to keep an appointment. What may interact with this medicine? -cisplatin -live virus vaccines This list may not describe all possible interactions. Give your health care provider a list of all the medicines, herbs, non-prescription drugs, or dietary supplements you use. Also tell them if you smoke, drink alcohol, or use illegal drugs. Some items may interact with your medicine. What should I watch for while using this medicine? Your condition will be monitored carefully while you are receiving this medicine. You may need blood work done while you are taking this medicine. This medicine can cause serious allergic reactions. To reduce your risk you may need to take medicine before treatment with this medicine. Take your medicine as  directed. In some patients, this medicine may cause a serious brain infection that may cause death. If you have any problems seeing, thinking, speaking, walking, or  standing, tell your healthcare professional right away. If you cannot reach your healthcare professional, urgently seek other source of medical care. Call your doctor or health care professional for advice if you get a fever, chills or sore throat, or other symptoms of a cold or flu. Do not treat yourself. This drug decreases your body's ability to fight infections. Try to avoid being around people who are sick. Do not become pregnant while taking this medicine or for 12 months after stopping it. Women should inform their doctor if they wish to become pregnant or think they might be pregnant. There is a potential for serious side effects to an unborn child. Talk to your health care professional or pharmacist for more information. Do not breast-feed an infant while taking this medicine or for 6 months after stopping it. What side effects may I notice from receiving this medicine? Side effects that you should report to your doctor or health care professional as soon as possible: -allergic reactions like skin rash, itching or hives; swelling of the face, lips, or tongue -breathing problems -chest pain -changes in vision -diarrhea -headache with fever, neck stiffness, sensitivity to light, nausea, or confusion -fast, irregular heartbeat -loss of memory -low blood counts - this medicine may decrease the number of white blood cells, red blood cells and platelets. You may be at increased risk for infections and bleeding. -mouth sores -problems with balance, talking, or walking -redness, blistering, peeling or loosening of the skin, including inside the mouth -signs of infection - fever or chills, cough, sore throat, pain or difficulty passing urine -signs and symptoms of kidney injury like trouble passing urine or change in the amount of urine -signs and symptoms of liver injury like dark yellow or brown urine; general ill feeling or flu-like symptoms; light-colored stools; loss of appetite; nausea;  right upper belly pain; unusually weak or tired; yellowing of the eyes or skin -signs and symptoms of low blood pressure like dizziness; feeling faint or lightheaded, falls; unusually weak or tired -stomach pain -swelling of the ankles, feet, hands -unusual bleeding or bruising -vomiting Side effects that usually do not require medical attention (report to your doctor or health care professional if they continue or are bothersome): -headache -joint pain -muscle cramps or muscle pain -nausea -tiredness This list may not describe all possible side effects. Call your doctor for medical advice about side effects. You may report side effects to FDA at 1-800-FDA-1088. Where should I keep my medicine? This drug is given in a hospital or clinic and will not be stored at home. NOTE: This sheet is a summary. It may not cover all possible information. If you have questions about this medicine, talk to your doctor, pharmacist, or health care provider.  2019 [employer]/Gold Standard (2017-02-21 13:04:32)  Bendamustine Injection What is this medicine? BENDAMUSTINE (BEN da MUS teen) is a chemotherapy drug. It is used to treat chronic lymphocytic leukemia and non-Hodgkin lymphoma. This medicine may be used for other purposes; ask your health care provider or pharmacist if you have questions. COMMON BRAND NAME(S): BENDEKA, Treanda What should I tell my health care provider before I take this medicine? They need to know if you have any of these conditions: -infection (especially a virus infection such as chickenpox, cold sores, or herpes) -kidney disease -liver disease -an unusual or allergic reaction to bendamustine,  mannitol, other medicines, foods, dyes, or preservatives -pregnant or trying to get pregnant -breast-feeding How should I use this medicine? This medicine is for infusion into a vein. It is given by a health care professional in a hospital or clinic setting. Talk to your pediatrician  regarding the use of this medicine in children. Special care may be needed. Overdosage: If you think you have taken too much of this medicine contact a poison control center or emergency room at once. NOTE: This medicine is only for you. Do not share this medicine with others. What if I miss a dose? It is important not to miss your dose. Call your doctor or health care professional if you are unable to keep an appointment. What may interact with this medicine? Do not take this medicine with any of the following medications: -clozapine This medicine may also interact with the following medications: -atazanavir -cimetidine -ciprofloxacin -enoxacin -fluvoxamine -medicines for seizures like carbamazepine and phenobarbital -mexiletine -rifampin -tacrine -thiabendazole -zileuton This list may not describe all possible interactions. Give your health care provider a list of all the medicines, herbs, non-prescription drugs, or dietary supplements you use. Also tell them if you smoke, drink alcohol, or use illegal drugs. Some items may interact with your medicine. What should I watch for while using this medicine? This drug may make you feel generally unwell. This is not uncommon, as chemotherapy can affect healthy cells as well as cancer cells. Report any side effects. Continue your course of treatment even though you feel ill unless your doctor tells you to stop. You may need blood work done while you are taking this medicine. Call your doctor or health care professional for advice if you get a fever, chills or sore throat, or other symptoms of a cold or flu. Do not treat yourself. This drug decreases your body's ability to fight infections. Try to avoid being around people who are sick. This medicine may increase your risk to bruise or bleed. Call your doctor or health care professional if you notice any unusual bleeding. Talk to your doctor about your risk of cancer. You may be more at risk for  certain types of cancers if you take this medicine. Do not become pregnant while taking this medicine or for 3 months after stopping it. Women should inform their doctor if they wish to become pregnant or think they might be pregnant. Men should not father a child while taking this medicine and for 3 months after stopping it.There is a potential for serious side effects to an unborn child. Talk to your health care professional or pharmacist for more information. Do not breast-feed an infant while taking this medicine. This medicine may interfere with the ability to have a child. You should talk with your doctor or health care professional if you are concerned about your fertility. What side effects may I notice from receiving this medicine? Side effects that you should report to your doctor or health care professional as soon as possible: -allergic reactions like skin rash, itching or hives, swelling of the face, lips, or tongue -low blood counts - this medicine may decrease the number of white blood cells, red blood cells and platelets. You may be at increased risk for infections and bleeding. -redness, blistering, peeling or loosening of the skin, including inside the mouth -signs of infection - fever or chills, cough, sore throat, pain or difficulty passing urine -signs of decreased platelets or bleeding - bruising, pinpoint red spots on the skin, black, tarry stools,  blood in the urine -signs of decreased red blood cells - unusually weak or tired, fainting spells, lightheadedness -signs and symptoms of kidney injury like trouble passing urine or change in the amount of urine -signs and symptoms of liver injury like dark yellow or brown urine; general ill feeling or flu-like symptoms; light-colored stools; loss of appetite; nausea; right upper belly pain; unusually weak or tired; yellowing of the eyes or skin Side effects that usually do not require medical attention (report to your doctor or health  care professional if they continue or are bothersome): -constipation -decreased appetite -diarrhea -headache -mouth sores -nausea/vomiting -tiredness This list may not describe all possible side effects. Call your doctor for medical advice about side effects. You may report side effects to FDA at 1-800-FDA-1088. Where should I keep my medicine? This drug is given in a hospital or clinic and will not be stored at home. NOTE: This sheet is a summary. It may not cover all possible information. If you have questions about this medicine, talk to your doctor, pharmacist, or health care provider.  2019 [employer]/Gold Standard (2015-01-12 08:45:41)

## 2018-07-28 NOTE — Patient Instructions (Signed)

## 2018-07-29 ENCOUNTER — Inpatient Hospital Stay: Payer: Medicare Other

## 2018-07-29 ENCOUNTER — Other Ambulatory Visit: Payer: Self-pay

## 2018-07-29 VITALS — BP 119/62 | HR 68 | Temp 98.2°F | Resp 18

## 2018-07-29 DIAGNOSIS — C8318 Mantle cell lymphoma, lymph nodes of multiple sites: Secondary | ICD-10-CM | POA: Diagnosis not present

## 2018-07-29 DIAGNOSIS — Z79899 Other long term (current) drug therapy: Secondary | ICD-10-CM | POA: Diagnosis not present

## 2018-07-29 DIAGNOSIS — Z5111 Encounter for antineoplastic chemotherapy: Secondary | ICD-10-CM | POA: Diagnosis not present

## 2018-07-29 DIAGNOSIS — D61818 Other pancytopenia: Secondary | ICD-10-CM | POA: Diagnosis not present

## 2018-07-29 DIAGNOSIS — G629 Polyneuropathy, unspecified: Secondary | ICD-10-CM | POA: Diagnosis not present

## 2018-07-29 MED ORDER — HEPARIN SOD (PORK) LOCK FLUSH 100 UNIT/ML IV SOLN
500.0000 [IU] | Freq: Once | INTRAVENOUS | Status: AC | PRN
  Administered 2018-07-29: 500 [IU]
  Filled 2018-07-29: qty 5

## 2018-07-29 MED ORDER — DEXAMETHASONE SODIUM PHOSPHATE 10 MG/ML IJ SOLN
INTRAMUSCULAR | Status: AC
  Filled 2018-07-29: qty 1

## 2018-07-29 MED ORDER — DEXAMETHASONE SODIUM PHOSPHATE 10 MG/ML IJ SOLN
10.0000 mg | Freq: Once | INTRAMUSCULAR | Status: AC
  Administered 2018-07-29: 10 mg via INTRAVENOUS

## 2018-07-29 MED ORDER — SODIUM CHLORIDE 0.9% FLUSH
10.0000 mL | INTRAVENOUS | Status: DC | PRN
  Administered 2018-07-29: 10:00:00 10 mL
  Filled 2018-07-29: qty 10

## 2018-07-29 MED ORDER — SODIUM CHLORIDE 0.9 % IV SOLN
90.0000 mg/m2 | Freq: Once | INTRAVENOUS | Status: AC
  Administered 2018-07-29: 10:00:00 200 mg via INTRAVENOUS
  Filled 2018-07-29: qty 8

## 2018-07-29 MED ORDER — SODIUM CHLORIDE 0.9 % IV SOLN
Freq: Once | INTRAVENOUS | Status: AC
  Administered 2018-07-29: 09:00:00 via INTRAVENOUS
  Filled 2018-07-29: qty 250

## 2018-07-29 NOTE — Patient Instructions (Signed)
[facility] Discharge Instructions for Patients Receiving Chemotherapy  Today you received the following chemotherapy agents Rituxan and Bendeka  To help prevent nausea and vomiting after your treatment, we encourage you to take your nausea medication as directed.  If you develop nausea and vomiting that is not controlled by your nausea medication, call the clinic.   BELOW ARE SYMPTOMS THAT SHOULD BE REPORTED IMMEDIATELY:  *FEVER GREATER THAN 100.5 F  *CHILLS WITH OR WITHOUT FEVER  NAUSEA AND VOMITING THAT IS NOT CONTROLLED WITH YOUR NAUSEA MEDICATION  *UNUSUAL SHORTNESS OF BREATH  *UNUSUAL BRUISING OR BLEEDING  TENDERNESS IN MOUTH AND THROAT WITH OR WITHOUT PRESENCE OF ULCERS  *URINARY PROBLEMS  *BOWEL PROBLEMS  UNUSUAL RASH Items with * indicate a potential emergency and should be followed up as soon as possible.  Feel free to call the clinic should you have any questions or concerns. The clinic phone number is [phone].  Please show the [facility] [place] at check-in to the Emergency Department and triage nurse.  Rituximab injection What is this medicine? RITUXIMAB (ri TUX i mab) is a monoclonal antibody. It is used to treat certain types of cancer like non-Hodgkin lymphoma and chronic lymphocytic leukemia. It is also used to treat rheumatoid arthritis, granulomatosis with polyangiitis (or Wegener's granulomatosis), microscopic polyangiitis, and pemphigus vulgaris. This medicine may be used for other purposes; ask your health care provider or pharmacist if you have questions. COMMON BRAND NAME(S): Rituxan What should I tell my health care provider before I take this medicine? They need to know if you have any of these conditions: -heart disease -infection (especially a virus infection such as hepatitis B, chickenpox, cold sores, or herpes) -immune system problems -irregular heartbeat -kidney disease -low blood counts, like low white cell, platelet,  or red cell counts -lung or breathing disease, like asthma -recently received or scheduled to receive a vaccine -an unusual or allergic reaction to rituximab, other medicines, foods, dyes, or preservatives -pregnant or trying to get pregnant -breast-feeding How should I use this medicine? This medicine is for infusion into a vein. It is administered in a hospital or clinic by a specially trained health care professional. A special MedGuide will be given to you by the pharmacist with each prescription and refill. Be sure to read this information carefully each time. Talk to your pediatrician regarding the use of this medicine in children. This medicine is not approved for use in children. Overdosage: If you think you have taken too much of this medicine contact a poison control center or emergency room at once. NOTE: This medicine is only for you. Do not share this medicine with others. What if I miss a dose? It is important not to miss a dose. Call your doctor or health care professional if you are unable to keep an appointment. What may interact with this medicine? -cisplatin -live virus vaccines This list may not describe all possible interactions. Give your health care provider a list of all the medicines, herbs, non-prescription drugs, or dietary supplements you use. Also tell them if you smoke, drink alcohol, or use illegal drugs. Some items may interact with your medicine. What should I watch for while using this medicine? Your condition will be monitored carefully while you are receiving this medicine. You may need blood work done while you are taking this medicine. This medicine can cause serious allergic reactions. To reduce your risk you may need to take medicine before treatment with this medicine. Take your medicine as  directed. In some patients, this medicine may cause a serious brain infection that may cause death. If you have any problems seeing, thinking, speaking, walking, or  standing, tell your healthcare professional right away. If you cannot reach your healthcare professional, urgently seek other source of medical care. Call your doctor or health care professional for advice if you get a fever, chills or sore throat, or other symptoms of a cold or flu. Do not treat yourself. This drug decreases your body's ability to fight infections. Try to avoid being around people who are sick. Do not become pregnant while taking this medicine or for 12 months after stopping it. Women should inform their doctor if they wish to become pregnant or think they might be pregnant. There is a potential for serious side effects to an unborn child. Talk to your health care professional or pharmacist for more information. Do not breast-feed an infant while taking this medicine or for 6 months after stopping it. What side effects may I notice from receiving this medicine? Side effects that you should report to your doctor or health care professional as soon as possible: -allergic reactions like skin rash, itching or hives; swelling of the face, lips, or tongue -breathing problems -chest pain -changes in vision -diarrhea -headache with fever, neck stiffness, sensitivity to light, nausea, or confusion -fast, irregular heartbeat -loss of memory -low blood counts - this medicine may decrease the number of white blood cells, red blood cells and platelets. You may be at increased risk for infections and bleeding. -mouth sores -problems with balance, talking, or walking -redness, blistering, peeling or loosening of the skin, including inside the mouth -signs of infection - fever or chills, cough, sore throat, pain or difficulty passing urine -signs and symptoms of kidney injury like trouble passing urine or change in the amount of urine -signs and symptoms of liver injury like dark yellow or brown urine; general ill feeling or flu-like symptoms; light-colored stools; loss of appetite; nausea;  right upper belly pain; unusually weak or tired; yellowing of the eyes or skin -signs and symptoms of low blood pressure like dizziness; feeling faint or lightheaded, falls; unusually weak or tired -stomach pain -swelling of the ankles, feet, hands -unusual bleeding or bruising -vomiting Side effects that usually do not require medical attention (report to your doctor or health care professional if they continue or are bothersome): -headache -joint pain -muscle cramps or muscle pain -nausea -tiredness This list may not describe all possible side effects. Call your doctor for medical advice about side effects. You may report side effects to FDA at 1-800-FDA-1088. Where should I keep my medicine? This drug is given in a hospital or clinic and will not be stored at home. NOTE: This sheet is a summary. It may not cover all possible information. If you have questions about this medicine, talk to your doctor, pharmacist, or health care provider.  2019 Elsevier/Gold Standard (2017-02-21 13:04:32)  Bendamustine Injection What is this medicine? BENDAMUSTINE (BEN da MUS teen) is a chemotherapy drug. It is used to treat chronic lymphocytic leukemia and non-Hodgkin lymphoma. This medicine may be used for other purposes; ask your health care provider or pharmacist if you have questions. COMMON BRAND NAME(S): BENDEKA, Treanda What should I tell my health care provider before I take this medicine? They need to know if you have any of these conditions: -infection (especially a virus infection such as chickenpox, cold sores, or herpes) -kidney disease -liver disease -an unusual or allergic reaction to bendamustine,  mannitol, other medicines, foods, dyes, or preservatives -pregnant or trying to get pregnant -breast-feeding How should I use this medicine? This medicine is for infusion into a vein. It is given by a health care professional in a hospital or clinic setting. Talk to your pediatrician  regarding the use of this medicine in children. Special care may be needed. Overdosage: If you think you have taken too much of this medicine contact a poison control center or emergency room at once. NOTE: This medicine is only for you. Do not share this medicine with others. What if I miss a dose? It is important not to miss your dose. Call your doctor or health care professional if you are unable to keep an appointment. What may interact with this medicine? Do not take this medicine with any of the following medications: -clozapine This medicine may also interact with the following medications: -atazanavir -cimetidine -ciprofloxacin -enoxacin -fluvoxamine -medicines for seizures like carbamazepine and phenobarbital -mexiletine -rifampin -tacrine -thiabendazole -zileuton This list may not describe all possible interactions. Give your health care provider a list of all the medicines, herbs, non-prescription drugs, or dietary supplements you use. Also tell them if you smoke, drink alcohol, or use illegal drugs. Some items may interact with your medicine. What should I watch for while using this medicine? This drug may make you feel generally unwell. This is not uncommon, as chemotherapy can affect healthy cells as well as cancer cells. Report any side effects. Continue your course of treatment even though you feel ill unless your doctor tells you to stop. You may need blood work done while you are taking this medicine. Call your doctor or health care professional for advice if you get a fever, chills or sore throat, or other symptoms of a cold or flu. Do not treat yourself. This drug decreases your body's ability to fight infections. Try to avoid being around people who are sick. This medicine may increase your risk to bruise or bleed. Call your doctor or health care professional if you notice any unusual bleeding. Talk to your doctor about your risk of cancer. You may be more at risk for  certain types of cancers if you take this medicine. Do not become pregnant while taking this medicine or for 3 months after stopping it. Women should inform their doctor if they wish to become pregnant or think they might be pregnant. Men should not father a child while taking this medicine and for 3 months after stopping it.There is a potential for serious side effects to an unborn child. Talk to your health care professional or pharmacist for more information. Do not breast-feed an infant while taking this medicine. This medicine may interfere with the ability to have a child. You should talk with your doctor or health care professional if you are concerned about your fertility. What side effects may I notice from receiving this medicine? Side effects that you should report to your doctor or health care professional as soon as possible: -allergic reactions like skin rash, itching or hives, swelling of the face, lips, or tongue -low blood counts - this medicine may decrease the number of white blood cells, red blood cells and platelets. You may be at increased risk for infections and bleeding. -redness, blistering, peeling or loosening of the skin, including inside the mouth -signs of infection - fever or chills, cough, sore throat, pain or difficulty passing urine -signs of decreased platelets or bleeding - bruising, pinpoint red spots on the skin, black, tarry stools,  blood in the urine -signs of decreased red blood cells - unusually weak or tired, fainting spells, lightheadedness -signs and symptoms of kidney injury like trouble passing urine or change in the amount of urine -signs and symptoms of liver injury like dark yellow or brown urine; general ill feeling or flu-like symptoms; light-colored stools; loss of appetite; nausea; right upper belly pain; unusually weak or tired; yellowing of the eyes or skin Side effects that usually do not require medical attention (report to your doctor or health  care professional if they continue or are bothersome): -constipation -decreased appetite -diarrhea -headache -mouth sores -nausea/vomiting -tiredness This list may not describe all possible side effects. Call your doctor for medical advice about side effects. You may report side effects to FDA at 1-800-FDA-1088. Where should I keep my medicine? This drug is given in a hospital or clinic and will not be stored at home. NOTE: This sheet is a summary. It may not cover all possible information. If you have questions about this medicine, talk to your doctor, pharmacist, or health care provider.  2019 Elsevier/Gold Standard (2015-01-12 08:45:41)

## 2018-07-31 ENCOUNTER — Telehealth: Payer: Self-pay | Admitting: *Deleted

## 2018-07-31 NOTE — Telephone Encounter (Signed)
Patient called with constipation. He reports he has been taking Miralax once a day and drinking plenty of water. Discussed with Dr. Alvy Bimler, he will start Miralax twice a day for two days and if no results he will use Senakot. Wife verbalized an understanding with patient on the phone. They will call back with any concerns or further questions.

## 2018-08-04 ENCOUNTER — Inpatient Hospital Stay: Payer: Medicare Other

## 2018-08-04 NOTE — Progress Notes (Signed)
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  Past medical history of PVD.   RD working remotely.  Spoke with patient and wife via phone this am.  Patient with questions regarding healthy diet.  Patient reports that he and wife live at [employer] retirement community and are fed a good balanced diet.  Reports appetite is good.  Breakfast is usually cottage cheese and fruit and toast with decaf tea.  Lunch is fish or some meat with vegetables. Dinner is lighter meal of soup, salad with eggs and sometimes beans added.  Drinks water, gatorade and cranberry juice and decaf tea.    Reports some constipation typically after treatment.  Medications have been recommended by Dr. Alvy Bimler     Nutrition Focused Physical Exam: deferred   Medications: vit d, zofran, compazine, miralax   Labs: reviewed   Anthropometrics:   Height: 73 inches Weight: 216 lb 12.8 on 5/5 UBW: 222 lb noted in March 2020 BMI: 28  3% weight loss in the last 2 months, not significant   Estimated Energy Needs  Kcals: 2200-2450 Protein: 110-122 g Fluid: 2.4 L   NUTRITION DIAGNOSIS: Food and nutrition related knowledge deficit related to cancer diagnosis   INTERVENTION:  Discussed foods well balanced diet encouraging good sources of protein at meal times. Discussed foods high in iron, folate, vit c.  Questions answered to patient and wife's satisfaction. Contact information provided and patient to contact RD if further nutrition questions or concerns.   MONITORING, EVALUATION, GOAL: patient will consume adequate calorie and protein to maintain weight during treatment   Next Visit: no follow-up planned  Shereese Bonnie B. Zenia Resides, [place], [place] Registered Dietitian [phone] (pager)

## 2018-08-14 ENCOUNTER — Other Ambulatory Visit: Payer: Self-pay | Admitting: Hematology and Oncology

## 2018-08-18 ENCOUNTER — Telehealth: Payer: Self-pay | Admitting: Neurology

## 2018-08-18 NOTE — Telephone Encounter (Signed)
Due to current COVID 19 pandemic, our office is severely reducing in office visits until further notice, in order to minimize the risk to our patients and healthcare providers.   Called patient and confirmed a virtual visit for his 6/2 appointment. Patient verbalized understanding of the doxy.me process and I have sent him an e-mail with link and directions as well as my name and office number/hours for reference. Patient understands that he will receive a call from RN to update chart.  Pt understands that although there may be some limitations with this type of visit, we will take all precautions to reduce any security or privacy concerns.  Pt understands that this will be treated like an in office visit and we will file with pt's insurance, and there may be a patient responsible charge related to this service.

## 2018-08-19 ENCOUNTER — Encounter: Payer: Self-pay | Admitting: Hematology and Oncology

## 2018-08-19 ENCOUNTER — Other Ambulatory Visit: Payer: Medicare Other

## 2018-08-19 ENCOUNTER — Telehealth: Payer: Self-pay

## 2018-08-19 NOTE — Telephone Encounter (Signed)
Wife called and left a message. PET scan is scheduled for 6/1. They have no other appts and left a message for the scheduler. Do they need labs and flush prior to scan and appt with Dr. Alvy Bimler.

## 2018-08-19 NOTE — Telephone Encounter (Signed)
I sent a scheduling msg last week I checked on it and it was not worked on I will forward it to you

## 2018-08-20 ENCOUNTER — Encounter: Payer: Self-pay | Admitting: Neurology

## 2018-08-20 NOTE — Telephone Encounter (Signed)

## 2018-08-21 ENCOUNTER — Telehealth: Payer: Self-pay | Admitting: Hematology and Oncology

## 2018-08-21 NOTE — Telephone Encounter (Signed)
Called and confirmed 6/3 appt after add on infusion was approved. Spoke with patient. Confirmed date and time

## 2018-08-24 ENCOUNTER — Inpatient Hospital Stay: Payer: Medicare Other | Attending: Hematology and Oncology

## 2018-08-24 ENCOUNTER — Inpatient Hospital Stay: Payer: Medicare Other

## 2018-08-24 ENCOUNTER — Other Ambulatory Visit: Payer: Self-pay

## 2018-08-24 ENCOUNTER — Ambulatory Visit (HOSPITAL_COMMUNITY)
Admission: RE | Admit: 2018-08-24 | Discharge: 2018-08-24 | Disposition: A | Discharge status: Home / Self Care | Payer: Medicare Other | Source: Ambulatory Visit | Attending: Hematology and Oncology | Admitting: Hematology and Oncology

## 2018-08-24 DIAGNOSIS — C8311 Mantle cell lymphoma, lymph nodes of head, face, and neck: Secondary | ICD-10-CM | POA: Diagnosis not present

## 2018-08-24 DIAGNOSIS — Z5111 Encounter for antineoplastic chemotherapy: Secondary | ICD-10-CM | POA: Insufficient documentation

## 2018-08-24 DIAGNOSIS — D61818 Other pancytopenia: Secondary | ICD-10-CM | POA: Diagnosis not present

## 2018-08-24 DIAGNOSIS — C8318 Mantle cell lymphoma, lymph nodes of multiple sites: Secondary | ICD-10-CM

## 2018-08-24 DIAGNOSIS — Z5112 Encounter for antineoplastic immunotherapy: Secondary | ICD-10-CM | POA: Diagnosis not present

## 2018-08-24 DIAGNOSIS — K5909 Other constipation: Secondary | ICD-10-CM | POA: Insufficient documentation

## 2018-08-24 DIAGNOSIS — Z79899 Other long term (current) drug therapy: Secondary | ICD-10-CM | POA: Diagnosis not present

## 2018-08-24 DIAGNOSIS — C8310 Mantle cell lymphoma, unspecified site: Secondary | ICD-10-CM | POA: Diagnosis not present

## 2018-08-24 LAB — CBC WITH DIFFERENTIAL (CANCER CENTER ONLY)
Abs Immature Granulocytes: 0.02 10*3/uL (ref 0.00–0.07)
Basophils Absolute: 0.1 10*3/uL (ref 0.0–0.1)
Basophils Relative: 1 %
Eosinophils Absolute: 0.2 10*3/uL (ref 0.0–0.5)
Eosinophils Relative: 6 %
HCT: 39 % (ref 39.0–52.0)
Hemoglobin: 12.8 g/dL — ABNORMAL LOW (ref 13.0–17.0)
Immature Granulocytes: 1 %
Lymphocytes Relative: 7 %
Lymphs Abs: 0.2 10*3/uL — ABNORMAL LOW (ref 0.7–4.0)
MCH: 30.4 pg (ref 26.0–34.0)
MCHC: 32.8 g/dL (ref 30.0–36.0)
MCV: 92.6 fL (ref 80.0–100.0)
Monocytes Absolute: 0.9 10*3/uL (ref 0.1–1.0)
Monocytes Relative: 25 %
Neutro Abs: 2.1 10*3/uL (ref 1.7–7.7)
Neutrophils Relative %: 60 %
Platelet Count: 152 10*3/uL (ref 150–400)
RBC: 4.21 MIL/uL — ABNORMAL LOW (ref 4.22–5.81)
RDW: 13.3 % (ref 11.5–15.5)
WBC Count: 3.6 10*3/uL — ABNORMAL LOW (ref 4.0–10.5)
nRBC: 0 % (ref 0.0–0.2)

## 2018-08-24 LAB — CMP (CANCER CENTER ONLY)
ALT: 18 U/L (ref 0–44)
AST: 18 U/L (ref 15–41)
Albumin: 4 g/dL (ref 3.5–5.0)
Alkaline Phosphatase: 54 U/L (ref 38–126)
Anion gap: 7 (ref 5–15)
BUN: 17 mg/dL (ref 8–23)
CO2: 27 mmol/L (ref 22–32)
Calcium: 9.3 mg/dL (ref 8.9–10.3)
Chloride: 107 mmol/L (ref 98–111)
Creatinine: 1.18 mg/dL (ref 0.61–1.24)
GFR, Est AFR Am: >60 mL/min (ref >60)
GFR, Estimated: 59 mL/min — ABNORMAL LOW (ref >60)
Glucose, Bld: 92 mg/dL (ref 70–99)
Potassium: 4.3 mmol/L (ref 3.5–5.1)
Sodium: 141 mmol/L (ref 135–145)
Total Bilirubin: 1.2 mg/dL (ref 0.3–1.2)
Total Protein: 6.5 g/dL (ref 6.5–8.1)

## 2018-08-24 LAB — GLUCOSE, CAPILLARY: Glucose-Capillary: 88 mg/dL (ref 70–99)

## 2018-08-24 MED ORDER — FLUDEOXYGLUCOSE F - 18 (FDG) INJECTION
10.8300 | Freq: Once | INTRAVENOUS | Status: AC | PRN
  Administered 2018-08-24: 11:00:00 10.83 via INTRAVENOUS

## 2018-08-24 MED ORDER — SODIUM CHLORIDE 0.9% FLUSH
10.0000 mL | Freq: Once | INTRAVENOUS | Status: AC
  Administered 2018-08-24: 10 mL
  Filled 2018-08-24: qty 10

## 2018-08-24 NOTE — Patient Instructions (Signed)

## 2018-08-25 ENCOUNTER — Ambulatory Visit (INDEPENDENT_AMBULATORY_CARE_PROVIDER_SITE_OTHER): Payer: Medicare Other | Admitting: Neurology

## 2018-08-25 ENCOUNTER — Other Ambulatory Visit: Payer: Medicare Other

## 2018-08-25 ENCOUNTER — Encounter: Payer: Self-pay | Admitting: Neurology

## 2018-08-25 DIAGNOSIS — G629 Polyneuropathy, unspecified: Secondary | ICD-10-CM | POA: Diagnosis not present

## 2018-08-25 MED ORDER — BUPROPION HCL ER (XL) 150 MG PO TB24: 150.0000 mg | ORAL_TABLET | Freq: Every day | ORAL | 1 refills | Status: DC

## 2018-08-25 NOTE — Progress Notes (Signed)
Virtual Visit via Video Note  I connected with Sean Stanley on 08/25/18 at  1:30 PM EDT by a video enabled telemedicine application and verified that I am speaking with the correct person using two identifiers.   I discussed the limitations of evaluation and management by telemedicine and the availability of in person appointments. The patient expressed understanding and agreed to proceed.  History of Present Illness: patient with neuropathy and recently diagnosed Lymphoma, having success with neuropathy control by walking regularly, only needing occasionally tylenol.     Observations/Objective: reports improvement in sleep duration and quality. Patient presents well groomed, in no acute distress.  Mr. Sean Stanley is a 78 year old Caucasian right-handed gentleman with an underlying medical history of non-Hodgkin lymphoma, neuropathy, he was evaluated for sleep apnea which was mild and strictly positional without associated hypoxemia and there were no PLM's.  Yet his neuropathy has kept him from sleeping well and he has reported very fragmented sleep.  Dr. 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  This is a neuropathy form that can be seen with agent orange exposure, which the patient had during the [place] War.  This neuropathy has ascended over the last 25 years slowly but steadily.     He indicated that no refill is needed at this time.  We agreed on a revisit in 6 months.  Assessment and Plan: Rv in 6 month with MOCA again, last time 26/30 points. May continue RV every 6 month.      I discussed the assessment and treatment plan with the patient. The patient was provided an opportunity to ask questions and all were answered. The patient agreed with the plan and demonstrated an understanding of the instructions.   The patient was advised to call back or seek an in-person evaluation if the symptoms worsen or if the  condition fails to improve as anticipated.  I provided 15 minutes of non-face-to-face time during this encounter.   Larey Seat, MD

## 2018-08-25 NOTE — Patient Instructions (Signed)
RV in 6 month with Np or me, MOCA repeat.

## 2018-08-26 ENCOUNTER — Other Ambulatory Visit: Payer: Self-pay

## 2018-08-26 ENCOUNTER — Encounter: Payer: Self-pay | Admitting: Hematology and Oncology

## 2018-08-26 ENCOUNTER — Inpatient Hospital Stay: Payer: Medicare Other

## 2018-08-26 ENCOUNTER — Inpatient Hospital Stay (HOSPITAL_BASED_OUTPATIENT_CLINIC_OR_DEPARTMENT_OTHER): Payer: Medicare Other | Admitting: Hematology and Oncology

## 2018-08-26 VITALS — BP 138/77 | HR 62 | Temp 98.3°F | Resp 18

## 2018-08-26 DIAGNOSIS — C8318 Mantle cell lymphoma, lymph nodes of multiple sites: Secondary | ICD-10-CM | POA: Diagnosis not present

## 2018-08-26 DIAGNOSIS — C8311 Mantle cell lymphoma, lymph nodes of head, face, and neck: Secondary | ICD-10-CM | POA: Diagnosis not present

## 2018-08-26 DIAGNOSIS — K5909 Other constipation: Secondary | ICD-10-CM | POA: Diagnosis not present

## 2018-08-26 DIAGNOSIS — Z5112 Encounter for antineoplastic immunotherapy: Secondary | ICD-10-CM | POA: Diagnosis not present

## 2018-08-26 DIAGNOSIS — D61818 Other pancytopenia: Secondary | ICD-10-CM

## 2018-08-26 DIAGNOSIS — Z5111 Encounter for antineoplastic chemotherapy: Secondary | ICD-10-CM | POA: Diagnosis not present

## 2018-08-26 MED ORDER — PALONOSETRON HCL INJECTION 0.25 MG/5ML
INTRAVENOUS | Status: AC
  Filled 2018-08-26: qty 5

## 2018-08-26 MED ORDER — DEXAMETHASONE SODIUM PHOSPHATE 10 MG/ML IJ SOLN
10.0000 mg | Freq: Once | INTRAMUSCULAR | Status: AC
  Administered 2018-08-26: 09:00:00 10 mg via INTRAVENOUS

## 2018-08-26 MED ORDER — HEPARIN SOD (PORK) LOCK FLUSH 100 UNIT/ML IV SOLN
500.0000 [IU] | Freq: Once | INTRAVENOUS | Status: AC | PRN
  Administered 2018-08-26: 12:00:00 500 [IU]
  Filled 2018-08-26: qty 5

## 2018-08-26 MED ORDER — SODIUM CHLORIDE 0.9 % IV SOLN
Freq: Once | INTRAVENOUS | Status: AC
  Administered 2018-08-26: 09:00:00 via INTRAVENOUS
  Filled 2018-08-26: qty 250

## 2018-08-26 MED ORDER — SODIUM CHLORIDE 0.9 % IV SOLN
375.0000 mg/m2 | Freq: Once | INTRAVENOUS | Status: AC
  Administered 2018-08-26: 10:00:00 900 mg via INTRAVENOUS
  Filled 2018-08-26: qty 50

## 2018-08-26 MED ORDER — DEXAMETHASONE SODIUM PHOSPHATE 10 MG/ML IJ SOLN
INTRAMUSCULAR | Status: AC
  Filled 2018-08-26: qty 1

## 2018-08-26 MED ORDER — ACETAMINOPHEN 325 MG PO TABS
650.0000 mg | ORAL_TABLET | Freq: Once | ORAL | Status: AC
  Administered 2018-08-26: 650 mg via ORAL

## 2018-08-26 MED ORDER — DIPHENHYDRAMINE HCL 25 MG PO CAPS
ORAL_CAPSULE | ORAL | Status: AC
  Filled 2018-08-26: qty 2

## 2018-08-26 MED ORDER — ACETAMINOPHEN 325 MG PO TABS
ORAL_TABLET | ORAL | Status: AC
  Filled 2018-08-26: qty 2

## 2018-08-26 MED ORDER — DIPHENHYDRAMINE HCL 25 MG PO CAPS
50.0000 mg | ORAL_CAPSULE | Freq: Once | ORAL | Status: AC
  Administered 2018-08-26: 50 mg via ORAL

## 2018-08-26 MED ORDER — PALONOSETRON HCL INJECTION 0.25 MG/5ML
0.2500 mg | Freq: Once | INTRAVENOUS | Status: AC
  Administered 2018-08-26: 0.25 mg via INTRAVENOUS

## 2018-08-26 MED ORDER — SODIUM CHLORIDE 0.9% FLUSH
10.0000 mL | INTRAVENOUS | Status: DC | PRN
  Administered 2018-08-26: 10 mL
  Filled 2018-08-26: qty 10

## 2018-08-26 MED ORDER — SODIUM CHLORIDE 0.9 % IV SOLN
90.0000 mg/m2 | Freq: Once | INTRAVENOUS | Status: AC
  Administered 2018-08-26: 200 mg via INTRAVENOUS
  Filled 2018-08-26: qty 8

## 2018-08-26 NOTE — Assessment & Plan Note (Signed)
I have given him written instruction for aggressive laxative therapy after chemotherapy to avoid severe constipation again.

## 2018-08-26 NOTE — Progress Notes (Signed)
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[facility]) I have reviewed multiple imaging studies with the patient and his wife The patient has near complete response to therapy except for the persistent lymphadenopathy near the supraclavicular region Currently, he is not symptomatic I do not believe he has disease progression It is possible the area is still healing from his prior surgery I recommend we proceed with 3 more cycles of treatment and then repeat another PET scan in August   Pancytopenia, acquired [facility]) He is not symptomatic He does not need growth factor support Observe only  Other constipation I have given him written instruction for aggressive laxative therapy after chemotherapy to avoid severe constipation again.   No orders of the defined types were placed in this encounter.   INTERVAL HISTORY: Please see below for problem oriented charting. He returns today to review PET CT scan and cycle 4 of chemotherapy Since last time I saw him, he is doing well No new lymphadenopathy He had constipation for 4 to 5 days after each cycle of treatment, resolved with aggressive laxative No recent infection, fever or chills Appetite is excellent No worsening of his baseline neuropathy  SUMMARY OF ONCOLOGIC HISTORY: Oncology History   MIPI score 7.5 high risk     Mantle cell lymphoma ([place])   06/13/2017 Imaging    Ct imaging at [facility] 1. 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 2. 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 3. Slight reduction in size of right  retroperitoneal evolving hematoma.    03/30/2018 Imaging    Ct neck 1. Extensive adenopathy on the left. The largest node is a level 1/submandibular node measuring 38 x 24 x 22 mm. Largest level 2 node measures 3.2 x 2 x 2 cm. Numerous other smaller but round lymph nodes throughout the left neck in the level 2 through level 4 region. The largest level 5 node measures 3.4 x 3.4 x 2.3 cm with a transverse diameter of 2.3 cm. This contains some internal calcifications. There are numerous other pathologic nodes in the left supraclavicular to axillary region. This pattern of disease could be due to extensive left neck metastatic disease from unknown primary, lymphoma, or other systemic malignancy. 2. No evidence of mucosal or submucosal lesion. 3. Diameter of the ascending aorta is 4.5 cm. Recommend annual imaging followup by CTA or MRA. Aortic Atherosclerosis (ICD10-I70.0).     04/10/2018 Pathology Results    Left zone 1 neck mass, [name], [name], the findings indicate an atypical lymphoid proliferation but are too limited for definitive diagnosis. Excisional biopsy with flow cytometry is recommended.    04/10/2018 Procedure    He was ENT  who performed FNA of neck LN    04/27/2018 Pathology Results    Lymph node for lymphoma, Left zone 2 Cervical - MANTLE CELL LYMPHOMA - SEE  COMMENT Microscopic Comment The biopsies have nodal architectural effacement by a monotonous lymphoid population. The lymphocytes are predominantly small in size with round nuclei and mature, [name], [name], [name], [name], [name], [name], the features are consistent with a mantle cell lymphoma    05/13/2018 PET scan    1. Hypermetabolic left cervical lymphadenopathy, left axillary lymphadenopathy, mediastinal / right hilar lymphadenopathy, right external iliac lymphadenopathy, and subcentimeter left external iliac and left inguinal lymph nodes, [name], [name], [name], without focal lesions, that may represent lymphomatous involvement of the spleen.    05/21/2018 Cancer Staging    Staging form: Hodgkin and Non-Hodgkin Lymphoma, AJCC 8th Edition - Clinical: Stage III - Signed by Heath Lark, MD on 05/21/2018    05/26/2018 Procedure    Ultrasound and fluoroscopically guided right internal jugular single lumen power port catheter insertion. Tip in the SVC/RA junction. Catheter ready for use.     06/01/2018 -  Chemotherapy    The patient had Bendamustine and Rituximab for chemotherapy treatment    08/24/2018 PET scan    1. Partial response to therapy with complete resolution of size and metabolic activity of the dominant LEFT submandibular node (level 1) and LEFT axillary nodes. 2. Persistent and increased metabolic activity of left level 3 lymph nodes ( Deauville 4). Nodes are  partially calcified and similar size. 3. No new metastatic disease. 4. Normal spleen and bone marrow.     REVIEW OF SYSTEMS:   Constitutional: Denies fevers, chills or abnormal weight loss Eyes: Denies blurriness of vision Ears, nose, mouth, throat, and face: Denies mucositis or sore throat Respiratory: Denies cough, dyspnea or wheezes Cardiovascular: Denies palpitation, chest discomfort or lower extremity swelling Gastrointestinal:  Denies nausea, heartburn or change in bowel habits Skin: Denies abnormal skin rashes Lymphatics: Denies new lymphadenopathy or easy bruising Neurological:Denies numbness, tingling or new weaknesses Behavioral/Psych: Mood is stable, no new changes  All other systems were reviewed with the patient and are negative.  I have reviewed the past medical history, past surgical history, social history and family history with the patient and they are unchanged from previous note.  ALLERGIES:  is allergic to aspirin; levaquin [levofloxacin in d5w]; nickel; statins; allopurinol; and ampicillin.  MEDICATIONS:  Current Outpatient Medications  Medication Sig Dispense Refill  . acetaminophen (TYLENOL) 500 MG tablet Take 1,000 mg by mouth daily as needed for moderate pain.    Marland Kitchen acyclovir (ZOVIRAX) 400 MG tablet Take 1 tablet (400 mg total) by mouth daily. 90 tablet 3  . alfuzosin (UROXATRAL) 10 MG 24 hr tablet Take 10 mg by mouth at bedtime.     Marland Kitchen buPROPion (WELLBUTRIN XL) 150 MG 24 hr tablet Take 1 tablet (150 mg total) by mouth daily. 90 tablet 1  . Cholecalciferol (VITAMIN D) 2000 units tablet Take 2,000 Units by mouth at bedtime.     . finasteride (PROSCAR) 5 MG tablet Take  5 mg by mouth daily.     Marland Kitchen lidocaine-prilocaine (EMLA) cream Apply to affected area once 30 g 3  . metoprolol succinate (TOPROL-XL) 25 MG 24 hr tablet Take 12.5 mg by mouth daily.     . ondansetron (ZOFRAN) 8 MG tablet Take 1 tablet (8 mg total) by mouth every 8 (eight) hours as needed for  refractory nausea / vomiting. (Patient not taking: Reported on 08/20/2018) 30 tablet 1  . Polyethyl Glycol-Propyl Glycol (SYSTANE OP) Place 1 drop into both eyes 2 (two) times daily.    . prochlorperazine (COMPAZINE) 10 MG tablet Take 1 tablet (10 mg total) by mouth every 6 (six) hours as needed (Nausea or vomiting). (Patient not taking: Reported on 08/20/2018) 30 tablet 1   No current facility-administered medications for this visit.    Facility-Administered Medications Ordered in Other Visits  Medication Dose Route Frequency Provider Last Rate Last Dose  . bendamustine (BENDEKA) 200 mg in sodium chloride 0.9 % 50 mL (3.4483 mg/mL) chemo infusion  90 mg/m2 (Treatment Plan Recorded) Intravenous Once Alvy Bimler, Erin Uecker, MD      . heparin lock flush 100 unit/mL  500 Units Intracatheter Once PRN Alvy Bimler, Dollie Bressi, MD      . riTUXimab (RITUXAN) 900 mg in sodium chloride 0.9 % 160 mL infusion  375 mg/m2 (Treatment Plan Recorded) Intravenous Once Angeles Paolucci, MD      . sodium chloride flush (NS) 0.9 % injection 10 mL  10 mL Intracatheter PRN Alvy Bimler, Minh Jasper, MD        PHYSICAL EXAMINATION: ECOG PERFORMANCE STATUS: 1 - Symptomatic but completely ambulatory  Vitals:   08/26/18 0817  BP: (!) 117/52  Pulse: 66  Resp: 18  Temp: 98.7 F (37.1 C)  SpO2: 98%   Filed Weights   08/26/18 0817  Weight: 216 lb 12.8 oz (98.3 kg)    GENERAL:alert, no distress and comfortable SKIN: skin color, texture, turgor are normal, no rashes or significant lesions EYES: normal, Conjunctiva are pink and non-injected, sclera clear OROPHARYNX:no exudate, no erythema and lips, buccal mucosa, and tongue normal  NECK: supple, thyroid normal size, non-tender, without nodularity LYMPH:  no palpable lymphadenopathy in the cervical, axillary or inguinal LUNGS: clear to auscultation and percussion with normal breathing effort HEART: regular rate & rhythm and no murmurs and no lower extremity edema ABDOMEN:abdomen soft, non-tender and  normal bowel sounds Musculoskeletal:no cyanosis of digits and no clubbing  NEURO: alert & oriented x 3 with fluent speech, no focal motor/sensory deficits  LABORATORY DATA:  I have reviewed the data as listed    Component Value Date/Time   NA 141 08/24/2018 1005   K 4.3 08/24/2018 1005   CL 107 08/24/2018 1005   CO2 27 08/24/2018 1005   GLUCOSE 92 08/24/2018 1005   BUN 17 08/24/2018 1005   CREATININE 1.18 08/24/2018 1005   CALCIUM 9.3 08/24/2018 1005   PROT 6.5 08/24/2018 1005   PROT 6.6 07/23/2017 1504   ALBUMIN 4.0 08/24/2018 1005   AST 18 08/24/2018 1005   ALT 18 08/24/2018 1005   ALKPHOS 54 08/24/2018 1005   BILITOT 1.2 08/24/2018 1005   GFRNONAA 59 (L) 08/24/2018 1005   GFRAA >60 08/24/2018 1005    No results found for: SPEP, UPEP  Lab Results  Component Value Date   WBC 3.6 (L) 08/24/2018   NEUTROABS 2.1 08/24/2018   HGB 12.8 (L) 08/24/2018   HCT 39.0 08/24/2018   MCV 92.6 08/24/2018   PLT 152 08/24/2018  Chemistry      Component Value Date/Time   NA 141 08/24/2018 1005   K 4.3 08/24/2018 1005   CL 107 08/24/2018 1005   CO2 27 08/24/2018 1005   BUN 17 08/24/2018 1005   CREATININE 1.18 08/24/2018 1005      Component Value Date/Time   CALCIUM 9.3 08/24/2018 1005   ALKPHOS 54 08/24/2018 1005   AST 18 08/24/2018 1005   ALT 18 08/24/2018 1005   BILITOT 1.2 08/24/2018 1005       RADIOGRAPHIC STUDIES: I have reviewed multiple imaging studies with the patient I have personally reviewed the radiological images as listed and agreed with the findings in the report. Nm Pet Image Restag (ps) Skull Base To Thigh  Result Date: 08/24/2018 CLINICAL DATA:  Subsequent treatment strategy for mantle cell lymphoma. EXAM: NUCLEAR MEDICINE PET SKULL BASE TO THIGH TECHNIQUE: 10.3 mCi F-18 FDG was injected intravenously. Full-ring PET imaging was performed from the skull base to thigh after the radiotracer. CT data was obtained and used for attenuation correction and  anatomic localization. Fasting blood glucose: 88 mg/dl COMPARISON:  PET-CT 05/13/2018 FINDINGS: Mediastinal blood pool activity: SUV max 2.6 Liver activity: SUV max 4.4 NECK: The dominant the bulky lymph node in the LEFT neck just anterior to the submandibular gland (level 1) has near completely resolved. No residual metabolic activity. No pathologically enlarged nodes. However LEFT level 3 nodes beneath the sternocleidomastoid muscle measuring 20 mm (image 43/4) are similar in size to prior (21 mm) but have increased metabolic activity SUV max equal 7.6 increased from 4.1. These nodes have partial calcifications. Likewise a calcified node higher in LEFT level II location measuring 8 mm (image 32/4) has SUV max equal 3.6 compared to 3.2. Incidental CT findings: none CHEST: Bulky hypermetabolic LEFT axillary adenopathy seen on prior have completely resolved ( Deauville 1). Likewise resolution of the hypermetabolic is RIGHT hilar nodes and paratracheal nodes. Incidental CT findings: Radiotracer activity associated with the port. No suspicious pulmonary nodules remain. ABDOMEN/PELVIS: No abnormal hypermetabolic activity within the liver, pancreas, adrenal glands, or spleen. No hypermetabolic lymph nodes in the abdomen or pelvis. Incidental CT findings: none SKELETON: No focal hypermetabolic activity to suggest skeletal metastasis. Incidental CT findings: none IMPRESSION: 1. Partial response to therapy with complete resolution of size and metabolic activity of the dominant LEFT submandibular node (level 1) and LEFT axillary nodes. 2. Persistent and increased metabolic activity of left level 3 lymph nodes ( Deauville 4). Nodes are partially calcified and similar size. 3. No new metastatic disease. 4. Normal spleen and bone marrow. Electronically Signed   By: Suzy Bouchard M.D.   On: 08/24/2018 12:42    All questions were answered. The patient knows to call the clinic with any problems, questions or concerns. No  barriers to learning was detected.  I spent 25 minutes counseling the patient face to face. The total time spent in the appointment was 30 minutes and more than 50% was on counseling and review of test results  Heath Lark, MD 08/26/2018 9:13 AM

## 2018-08-26 NOTE — Assessment & Plan Note (Signed)
I have reviewed multiple imaging studies with the patient and his wife The patient has near complete response to therapy except for the persistent lymphadenopathy near the supraclavicular region Currently, he is not symptomatic I do not believe he has disease progression It is possible the area is still healing from his prior surgery I recommend we proceed with 3 more cycles of treatment and then repeat another PET scan in August

## 2018-08-26 NOTE — Assessment & Plan Note (Signed)
He is not symptomatic He does not need growth factor support Observe only

## 2018-08-26 NOTE — Patient Instructions (Signed)
[facility] Discharge Instructions for Patients Receiving Chemotherapy  Today you received the following chemotherapy agents Rituxan and Bendeka  To help prevent nausea and vomiting after your treatment, we encourage you to take your nausea medication as directed.  If you develop nausea and vomiting that is not controlled by your nausea medication, call the clinic.   BELOW ARE SYMPTOMS THAT SHOULD BE REPORTED IMMEDIATELY:  *FEVER GREATER THAN 100.5 F  *CHILLS WITH OR WITHOUT FEVER  NAUSEA AND VOMITING THAT IS NOT CONTROLLED WITH YOUR NAUSEA MEDICATION  *UNUSUAL SHORTNESS OF BREATH  *UNUSUAL BRUISING OR BLEEDING  TENDERNESS IN MOUTH AND THROAT WITH OR WITHOUT PRESENCE OF ULCERS  *URINARY PROBLEMS  *BOWEL PROBLEMS  UNUSUAL RASH Items with * indicate a potential emergency and should be followed up as soon as possible.  Feel free to call the clinic should you have any questions or concerns. The clinic phone number is [phone].  Please show the [facility] at check-in to the Emergency Department and triage nurse.  Rituximab injection What is this medicine? RITUXIMAB (ri TUX i mab) is a monoclonal antibody. It is used to treat certain types of cancer like non-Hodgkin lymphoma and chronic lymphocytic leukemia. It is also used to treat rheumatoid arthritis, granulomatosis with polyangiitis (or Wegener's granulomatosis), microscopic polyangiitis, and pemphigus vulgaris. This medicine may be used for other purposes; ask your health care provider or pharmacist if you have questions. COMMON BRAND NAME(S): Rituxan What should I tell my health care provider before I take this medicine? They need to know if you have any of these conditions: -heart disease -infection (especially a virus infection such as hepatitis B, chickenpox, cold sores, or herpes) -immune system problems -irregular heartbeat -kidney disease -low blood counts, like low white cell, platelet,  or red cell counts -lung or breathing disease, like asthma -recently received or scheduled to receive a vaccine -an unusual or allergic reaction to rituximab, other medicines, foods, dyes, or preservatives -pregnant or trying to get pregnant -breast-feeding How should I use this medicine? This medicine is for infusion into a vein. It is administered in a hospital or clinic by a specially trained health care professional. A special MedGuide will be given to you by the pharmacist with each prescription and refill. Be sure to read this information carefully each time. Talk to your pediatrician regarding the use of this medicine in children. This medicine is not approved for use in children. Overdosage: If you think you have taken too much of this medicine contact a poison control center or emergency room at once. NOTE: This medicine is only for you. Do not share this medicine with others. What if I miss a dose? It is important not to miss a dose. Call your doctor or health care professional if you are unable to keep an appointment. What may interact with this medicine? -cisplatin -live virus vaccines This list may not describe all possible interactions. Give your health care provider a list of all the medicines, herbs, non-prescription drugs, or dietary supplements you use. Also tell them if you smoke, drink alcohol, or use illegal drugs. Some items may interact with your medicine. What should I watch for while using this medicine? Your condition will be monitored carefully while you are receiving this medicine. You may need blood work done while you are taking this medicine. This medicine can cause serious allergic reactions. To reduce your risk you may need to take medicine before treatment with this medicine. Take your medicine as  directed. In some patients, this medicine may cause a serious brain infection that may cause death. If you have any problems seeing, thinking, speaking, walking, or  standing, tell your healthcare professional right away. If you cannot reach your healthcare professional, urgently seek other source of medical care. Call your doctor or health care professional for advice if you get a fever, chills or sore throat, or other symptoms of a cold or flu. Do not treat yourself. This drug decreases your body's ability to fight infections. Try to avoid being around people who are sick. Do not become pregnant while taking this medicine or for 12 months after stopping it. Women should inform their doctor if they wish to become pregnant or think they might be pregnant. There is a potential for serious side effects to an unborn child. Talk to your health care professional or pharmacist for more information. Do not breast-feed an infant while taking this medicine or for 6 months after stopping it. What side effects may I notice from receiving this medicine? Side effects that you should report to your doctor or health care professional as soon as possible: -allergic reactions like skin rash, itching or hives; swelling of the face, lips, or tongue -breathing problems -chest pain -changes in vision -diarrhea -headache with fever, neck stiffness, sensitivity to light, nausea, or confusion -fast, irregular heartbeat -loss of memory -low blood counts - this medicine may decrease the number of white blood cells, red blood cells and platelets. You may be at increased risk for infections and bleeding. -mouth sores -problems with balance, talking, or walking -redness, blistering, peeling or loosening of the skin, including inside the mouth -signs of infection - fever or chills, cough, sore throat, pain or difficulty passing urine -signs and symptoms of kidney injury like trouble passing urine or change in the amount of urine -signs and symptoms of liver injury like dark yellow or brown urine; general ill feeling or flu-like symptoms; light-colored stools; loss of appetite; nausea;  right upper belly pain; unusually weak or tired; yellowing of the eyes or skin -signs and symptoms of low blood pressure like dizziness; feeling faint or lightheaded, falls; unusually weak or tired -stomach pain -swelling of the ankles, feet, hands -unusual bleeding or bruising -vomiting Side effects that usually do not require medical attention (report to your doctor or health care professional if they continue or are bothersome): -headache -joint pain -muscle cramps or muscle pain -nausea -tiredness This list may not describe all possible side effects. Call your doctor for medical advice about side effects. You may report side effects to FDA at 1-800-FDA-1088. Where should I keep my medicine? This drug is given in a hospital or clinic and will not be stored at home. NOTE: This sheet is a summary. It may not cover all possible information. If you have questions about this medicine, talk to your doctor, pharmacist, or health care provider.  2019 Elsevier/Gold Standard (2017-02-21 13:04:32)  Bendamustine Injection What is this medicine? BENDAMUSTINE (BEN da MUS teen) is a chemotherapy drug. It is used to treat chronic lymphocytic leukemia and non-Hodgkin lymphoma. This medicine may be used for other purposes; ask your health care provider or pharmacist if you have questions. COMMON BRAND NAME(S): BENDEKA, Treanda What should I tell my health care provider before I take this medicine? They need to know if you have any of these conditions: -infection (especially a virus infection such as chickenpox, cold sores, or herpes) -kidney disease -liver disease -an unusual or allergic reaction to bendamustine,  mannitol, other medicines, foods, dyes, or preservatives -pregnant or trying to get pregnant -breast-feeding How should I use this medicine? This medicine is for infusion into a vein. It is given by a health care professional in a hospital or clinic setting. Talk to your pediatrician  regarding the use of this medicine in children. Special care may be needed. Overdosage: If you think you have taken too much of this medicine contact a poison control center or emergency room at once. NOTE: This medicine is only for you. Do not share this medicine with others. What if I miss a dose? It is important not to miss your dose. Call your doctor or health care professional if you are unable to keep an appointment. What may interact with this medicine? Do not take this medicine with any of the following medications: -clozapine This medicine may also interact with the following medications: -atazanavir -cimetidine -ciprofloxacin -enoxacin -fluvoxamine -medicines for seizures like carbamazepine and phenobarbital -mexiletine -rifampin -tacrine -thiabendazole -zileuton This list may not describe all possible interactions. Give your health care provider a list of all the medicines, herbs, non-prescription drugs, or dietary supplements you use. Also tell them if you smoke, drink alcohol, or use illegal drugs. Some items may interact with your medicine. What should I watch for while using this medicine? This drug may make you feel generally unwell. This is not uncommon, as chemotherapy can affect healthy cells as well as cancer cells. Report any side effects. Continue your course of treatment even though you feel ill unless your doctor tells you to stop. You may need blood work done while you are taking this medicine. Call your doctor or health care professional for advice if you get a fever, chills or sore throat, or other symptoms of a cold or flu. Do not treat yourself. This drug decreases your body's ability to fight infections. Try to avoid being around people who are sick. This medicine may increase your risk to bruise or bleed. Call your doctor or health care professional if you notice any unusual bleeding. Talk to your doctor about your risk of cancer. You may be more at risk for  certain types of cancers if you take this medicine. Do not become pregnant while taking this medicine or for 3 months after stopping it. Women should inform their doctor if they wish to become pregnant or think they might be pregnant. Men should not father a child while taking this medicine and for 3 months after stopping it.There is a potential for serious side effects to an unborn child. Talk to your health care professional or pharmacist for more information. Do not breast-feed an infant while taking this medicine. This medicine may interfere with the ability to have a child. You should talk with your doctor or health care professional if you are concerned about your fertility. What side effects may I notice from receiving this medicine? Side effects that you should report to your doctor or health care professional as soon as possible: -allergic reactions like skin rash, itching or hives, swelling of the face, lips, or tongue -low blood counts - this medicine may decrease the number of white blood cells, red blood cells and platelets. You may be at increased risk for infections and bleeding. -redness, blistering, peeling or loosening of the skin, including inside the mouth -signs of infection - fever or chills, cough, sore throat, pain or difficulty passing urine -signs of decreased platelets or bleeding - bruising, pinpoint red spots on the skin, black, tarry stools,  blood in the urine -signs of decreased red blood cells - unusually weak or tired, fainting spells, lightheadedness -signs and symptoms of kidney injury like trouble passing urine or change in the amount of urine -signs and symptoms of liver injury like dark yellow or brown urine; general ill feeling or flu-like symptoms; light-colored stools; loss of appetite; nausea; right upper belly pain; unusually weak or tired; yellowing of the eyes or skin Side effects that usually do not require medical attention (report to your doctor or health  care professional if they continue or are bothersome): -constipation -decreased appetite -diarrhea -headache -mouth sores -nausea/vomiting -tiredness This list may not describe all possible side effects. Call your doctor for medical advice about side effects. You may report side effects to FDA at 1-800-FDA-1088. Where should I keep my medicine? This drug is given in a hospital or clinic and will not be stored at home. NOTE: This sheet is a summary. It may not cover all possible information. If you have questions about this medicine, talk to your doctor, pharmacist, or health care provider.  2019 Elsevier/Gold Standard (2015-01-12 08:45:41)

## 2018-08-27 ENCOUNTER — Other Ambulatory Visit: Payer: Self-pay

## 2018-08-27 ENCOUNTER — Telehealth: Payer: Self-pay | Admitting: Hematology and Oncology

## 2018-08-27 ENCOUNTER — Inpatient Hospital Stay: Payer: Medicare Other

## 2018-08-27 VITALS — BP 128/64 | HR 67 | Temp 98.5°F | Resp 16

## 2018-08-27 DIAGNOSIS — K5909 Other constipation: Secondary | ICD-10-CM | POA: Diagnosis not present

## 2018-08-27 DIAGNOSIS — C8318 Mantle cell lymphoma, lymph nodes of multiple sites: Secondary | ICD-10-CM

## 2018-08-27 DIAGNOSIS — Z5111 Encounter for antineoplastic chemotherapy: Secondary | ICD-10-CM | POA: Diagnosis not present

## 2018-08-27 DIAGNOSIS — Z5112 Encounter for antineoplastic immunotherapy: Secondary | ICD-10-CM | POA: Diagnosis not present

## 2018-08-27 DIAGNOSIS — D61818 Other pancytopenia: Secondary | ICD-10-CM | POA: Diagnosis not present

## 2018-08-27 DIAGNOSIS — C8311 Mantle cell lymphoma, lymph nodes of head, face, and neck: Secondary | ICD-10-CM | POA: Diagnosis not present

## 2018-08-27 MED ORDER — SODIUM CHLORIDE 0.9 % IV SOLN
90.0000 mg/m2 | Freq: Once | INTRAVENOUS | Status: AC
  Administered 2018-08-27: 200 mg via INTRAVENOUS
  Filled 2018-08-27: qty 8

## 2018-08-27 MED ORDER — DEXAMETHASONE SODIUM PHOSPHATE 10 MG/ML IJ SOLN
INTRAMUSCULAR | Status: AC
  Filled 2018-08-27: qty 1

## 2018-08-27 MED ORDER — SODIUM CHLORIDE 0.9% FLUSH
10.0000 mL | INTRAVENOUS | Status: DC | PRN
  Administered 2018-08-27: 10 mL
  Filled 2018-08-27: qty 10

## 2018-08-27 MED ORDER — SODIUM CHLORIDE 0.9 % IV SOLN
Freq: Once | INTRAVENOUS | Status: AC
  Administered 2018-08-27: 09:00:00 via INTRAVENOUS
  Filled 2018-08-27: qty 250

## 2018-08-27 MED ORDER — DEXAMETHASONE SODIUM PHOSPHATE 10 MG/ML IJ SOLN
10.0000 mg | Freq: Once | INTRAMUSCULAR | Status: AC
  Administered 2018-08-27: 09:00:00 10 mg via INTRAVENOUS

## 2018-08-27 MED ORDER — HEPARIN SOD (PORK) LOCK FLUSH 100 UNIT/ML IV SOLN
500.0000 [IU] | Freq: Once | INTRAVENOUS | Status: AC | PRN
  Administered 2018-08-27: 500 [IU]
  Filled 2018-08-27: qty 5

## 2018-08-27 NOTE — Telephone Encounter (Signed)
Scheduled appts per sch msg. Called and spoke with patients wife. Confirmed dates and times

## 2018-08-27 NOTE — Patient Instructions (Signed)
[facility] Discharge Instructions for Patients Receiving Chemotherapy  Today you received the following chemotherapy agents Bendeka.  To help prevent nausea and vomiting after your treatment, we encourage you to take your nausea medication as directed.   If you develop nausea and vomiting that is not controlled by your nausea medication, call the clinic.   BELOW ARE SYMPTOMS THAT SHOULD BE REPORTED IMMEDIATELY:  *FEVER GREATER THAN 100.5 F  *CHILLS WITH OR WITHOUT FEVER  NAUSEA AND VOMITING THAT IS NOT CONTROLLED WITH YOUR NAUSEA MEDICATION  *UNUSUAL SHORTNESS OF BREATH  *UNUSUAL BRUISING OR BLEEDING  TENDERNESS IN MOUTH AND THROAT WITH OR WITHOUT PRESENCE OF ULCERS  *URINARY PROBLEMS  *BOWEL PROBLEMS  UNUSUAL RASH Items with * indicate a potential emergency and should be followed up as soon as possible.  Feel free to call the clinic should you have any questions or concerns. The clinic phone number is [phone].  Please show the CHEMO ALERT CARD at check-in to the Emergency Department and triage nurse.   

## 2018-09-07 DIAGNOSIS — Z0001 Encounter for general adult medical examination with abnormal findings: Secondary | ICD-10-CM | POA: Diagnosis not present

## 2018-09-07 DIAGNOSIS — Z1389 Encounter for screening for other disorder: Secondary | ICD-10-CM | POA: Diagnosis not present

## 2018-09-21 DIAGNOSIS — H40013 Open angle with borderline findings, low risk, bilateral: Secondary | ICD-10-CM | POA: Diagnosis not present

## 2018-09-21 DIAGNOSIS — H04123 Dry eye syndrome of bilateral lacrimal glands: Secondary | ICD-10-CM | POA: Diagnosis not present

## 2018-09-23 ENCOUNTER — Inpatient Hospital Stay: Payer: Medicare Other

## 2018-09-23 ENCOUNTER — Other Ambulatory Visit: Payer: Self-pay

## 2018-09-23 ENCOUNTER — Inpatient Hospital Stay (HOSPITAL_BASED_OUTPATIENT_CLINIC_OR_DEPARTMENT_OTHER): Payer: Medicare Other | Admitting: Hematology and Oncology

## 2018-09-23 ENCOUNTER — Inpatient Hospital Stay: Payer: Medicare Other | Attending: Hematology and Oncology

## 2018-09-23 ENCOUNTER — Encounter: Payer: Self-pay | Admitting: Hematology and Oncology

## 2018-09-23 VITALS — BP 133/82 | HR 60 | Temp 97.8°F | Resp 16

## 2018-09-23 DIAGNOSIS — M542 Cervicalgia: Secondary | ICD-10-CM | POA: Diagnosis not present

## 2018-09-23 DIAGNOSIS — C8318 Mantle cell lymphoma, lymph nodes of multiple sites: Secondary | ICD-10-CM

## 2018-09-23 DIAGNOSIS — D61818 Other pancytopenia: Secondary | ICD-10-CM

## 2018-09-23 DIAGNOSIS — Z5112 Encounter for antineoplastic immunotherapy: Secondary | ICD-10-CM | POA: Diagnosis not present

## 2018-09-23 DIAGNOSIS — Z79899 Other long term (current) drug therapy: Secondary | ICD-10-CM | POA: Diagnosis not present

## 2018-09-23 DIAGNOSIS — Z5111 Encounter for antineoplastic chemotherapy: Secondary | ICD-10-CM | POA: Insufficient documentation

## 2018-09-23 LAB — CMP (CANCER CENTER ONLY)
ALT: 24 U/L (ref 0–44)
AST: 22 U/L (ref 15–41)
Albumin: 3.9 g/dL (ref 3.5–5.0)
Alkaline Phosphatase: 59 U/L (ref 38–126)
Anion gap: 9 (ref 5–15)
BUN: 19 mg/dL (ref 8–23)
CO2: 25 mmol/L (ref 22–32)
Calcium: 8.9 mg/dL (ref 8.9–10.3)
Chloride: 106 mmol/L (ref 98–111)
Creatinine: 1.15 mg/dL (ref 0.61–1.24)
GFR, Est AFR Am: >60 mL/min (ref >60)
GFR, Estimated: >60 mL/min (ref >60)
Glucose, Bld: 80 mg/dL (ref 70–99)
Potassium: 4.4 mmol/L (ref 3.5–5.1)
Sodium: 140 mmol/L (ref 135–145)
Total Bilirubin: 1.1 mg/dL (ref 0.3–1.2)
Total Protein: 6.3 g/dL — ABNORMAL LOW (ref 6.5–8.1)

## 2018-09-23 LAB — CBC WITH DIFFERENTIAL (CANCER CENTER ONLY)
Abs Immature Granulocytes: 0.06 10*3/uL (ref 0.00–0.07)
Basophils Absolute: 0 10*3/uL (ref 0.0–0.1)
Basophils Relative: 1 %
Eosinophils Absolute: 0.2 10*3/uL (ref 0.0–0.5)
Eosinophils Relative: 5 %
HCT: 36.1 % — ABNORMAL LOW (ref 39.0–52.0)
Hemoglobin: 12 g/dL — ABNORMAL LOW (ref 13.0–17.0)
Immature Granulocytes: 1 %
Lymphocytes Relative: 5 %
Lymphs Abs: 0.2 10*3/uL — ABNORMAL LOW (ref 0.7–4.0)
MCH: 30.4 pg (ref 26.0–34.0)
MCHC: 33.2 g/dL (ref 30.0–36.0)
MCV: 91.4 fL (ref 80.0–100.0)
Monocytes Absolute: 0.9 10*3/uL (ref 0.1–1.0)
Monocytes Relative: 20 %
Neutro Abs: 3.1 10*3/uL (ref 1.7–7.7)
Neutrophils Relative %: 68 %
Platelet Count: 148 10*3/uL — ABNORMAL LOW (ref 150–400)
RBC: 3.95 MIL/uL — ABNORMAL LOW (ref 4.22–5.81)
RDW: 12.8 % (ref 11.5–15.5)
WBC Count: 4.5 10*3/uL (ref 4.0–10.5)
nRBC: 0 % (ref 0.0–0.2)

## 2018-09-23 MED ORDER — DIPHENHYDRAMINE HCL 25 MG PO CAPS
50.0000 mg | ORAL_CAPSULE | Freq: Once | ORAL | Status: AC
  Administered 2018-09-23: 50 mg via ORAL

## 2018-09-23 MED ORDER — PALONOSETRON HCL INJECTION 0.25 MG/5ML
0.2500 mg | Freq: Once | INTRAVENOUS | Status: AC
  Administered 2018-09-23: 0.25 mg via INTRAVENOUS

## 2018-09-23 MED ORDER — SODIUM CHLORIDE 0.9% FLUSH
10.0000 mL | Freq: Once | INTRAVENOUS | Status: AC
  Administered 2018-09-23: 08:00:00 10 mL
  Filled 2018-09-23: qty 10

## 2018-09-23 MED ORDER — DEXAMETHASONE SODIUM PHOSPHATE 10 MG/ML IJ SOLN
INTRAMUSCULAR | Status: AC
  Filled 2018-09-23: qty 1

## 2018-09-23 MED ORDER — SODIUM CHLORIDE 0.9 % IV SOLN
Freq: Once | INTRAVENOUS | Status: AC
  Administered 2018-09-23: 09:00:00 via INTRAVENOUS
  Filled 2018-09-23: qty 250

## 2018-09-23 MED ORDER — SODIUM CHLORIDE 0.9 % IV SOLN
90.0000 mg/m2 | Freq: Once | INTRAVENOUS | Status: AC
  Administered 2018-09-23: 200 mg via INTRAVENOUS
  Filled 2018-09-23: qty 8

## 2018-09-23 MED ORDER — HEPARIN SOD (PORK) LOCK FLUSH 100 UNIT/ML IV SOLN
500.0000 [IU] | Freq: Once | INTRAVENOUS | Status: AC | PRN
  Administered 2018-09-23: 500 [IU]
  Filled 2018-09-23: qty 5

## 2018-09-23 MED ORDER — ACETAMINOPHEN 325 MG PO TABS
650.0000 mg | ORAL_TABLET | Freq: Once | ORAL | Status: AC
  Administered 2018-09-23: 650 mg via ORAL

## 2018-09-23 MED ORDER — DIPHENHYDRAMINE HCL 25 MG PO CAPS
ORAL_CAPSULE | ORAL | Status: AC
  Filled 2018-09-23: qty 2

## 2018-09-23 MED ORDER — DEXAMETHASONE SODIUM PHOSPHATE 10 MG/ML IJ SOLN
10.0000 mg | Freq: Once | INTRAMUSCULAR | Status: AC
  Administered 2018-09-23: 10 mg via INTRAVENOUS

## 2018-09-23 MED ORDER — ACETAMINOPHEN 325 MG PO TABS
ORAL_TABLET | ORAL | Status: AC
  Filled 2018-09-23: qty 2

## 2018-09-23 MED ORDER — PALONOSETRON HCL INJECTION 0.25 MG/5ML
INTRAVENOUS | Status: AC
  Filled 2018-09-23: qty 5

## 2018-09-23 MED ORDER — SODIUM CHLORIDE 0.9% FLUSH
10.0000 mL | INTRAVENOUS | Status: DC | PRN
  Administered 2018-09-23: 10 mL
  Filled 2018-09-23: qty 10

## 2018-09-23 MED ORDER — SODIUM CHLORIDE 0.9 % IV SOLN
375.0000 mg/m2 | Freq: Once | INTRAVENOUS | Status: AC
  Administered 2018-09-23: 900 mg via INTRAVENOUS
  Filled 2018-09-23: qty 30

## 2018-09-23 NOTE — Assessment & Plan Note (Signed)
I have examined him in his left neck extensively and could not appreciate any abnormalities on exam The patient is reassured He is informed and instructed to consult me again for further evaluation if he had recurrence of neck pain.

## 2018-09-23 NOTE — Patient Instructions (Signed)
[facility] Discharge Instructions for Patients Receiving Chemotherapy  Today you received the following chemotherapy agents Rituxan and Bendeka  To help prevent nausea and vomiting after your treatment, we encourage you to take your nausea medication as directed by your MD.   If you develop nausea and vomiting that is not controlled by your nausea medication, call the clinic.   BELOW ARE SYMPTOMS THAT SHOULD BE REPORTED IMMEDIATELY:  *FEVER GREATER THAN 100.5 F  *CHILLS WITH OR WITHOUT FEVER  NAUSEA AND VOMITING THAT IS NOT CONTROLLED WITH YOUR NAUSEA MEDICATION  *UNUSUAL SHORTNESS OF BREATH  *UNUSUAL BRUISING OR BLEEDING  TENDERNESS IN MOUTH AND THROAT WITH OR WITHOUT PRESENCE OF ULCERS  *URINARY PROBLEMS  *BOWEL PROBLEMS  UNUSUAL RASH Items with * indicate a potential emergency and should be followed up as soon as possible.  Feel free to call the clinic should you have any questions or concerns. The clinic phone number is [phone].  Please show the [facility] at check-in to the Emergency Department and triage nurse.  Coronavirus (COVID-19) Are you at risk?  Are you at risk for the Coronavirus (COVID-19)?  To be considered HIGH RISK for Coronavirus (COVID-19), you have to meet the following criteria:  . Traveled to [place], [place], [place], [place] or [place]; or in the [place] to [place], [place], [place], or [place]; and have fever, cough, and shortness of breath within the last 2 weeks of travel OR . Been in close contact with a person diagnosed with COVID-19 within the last 2 weeks and have fever, cough, and shortness of breath . IF YOU DO NOT MEET THESE CRITERIA, YOU ARE CONSIDERED LOW RISK FOR COVID-19.  What to do if you are HIGH RISK for COVID-19?  Marland Kitchen If you are having a medical emergency, call 911. . Seek medical care right away. Before you go to a doctor's office, urgent care or emergency department, call ahead and tell  them about your recent travel, contact with someone diagnosed with COVID-19, and your symptoms. You should receive instructions from your physician's office regarding next steps of care.  . When you arrive at healthcare provider, tell the healthcare staff immediately you have returned from visiting [place], [place], [place], [place] or [place]; or traveled in the [place] to [place], [place], [place], or [place]; in the last two weeks or you have been in close contact with a person diagnosed with COVID-19 in the last 2 weeks.   . Tell the health care staff about your symptoms: fever, cough and shortness of breath. . After you have been seen by a medical provider, you will be either: o Tested for (COVID-19) and discharged home on quarantine except to seek medical care if symptoms worsen, and asked to  - Stay home and avoid contact with others until you get your results (4-5 days)  - Avoid travel on public transportation if possible (such as bus, train, or airplane) or o Sent to the Emergency Department by EMS for evaluation, COVID-19 testing, and possible admission depending on your condition and test results.  What to do if you are LOW RISK for COVID-19?  Reduce your risk of any infection by using the same precautions used for avoiding the common cold or flu:  Marland Kitchen Wash your hands often with soap and warm water for at least 20 seconds.  If soap and water are not readily available, use an alcohol-based hand sanitizer with at least 60% alcohol.  Marland Kitchen  If coughing or sneezing, cover your mouth and nose by coughing or sneezing into the elbow areas of your shirt or coat, into a tissue or into your sleeve (not your hands). . Avoid shaking hands with others and consider head nods or verbal greetings only. . Avoid touching your eyes, nose, or mouth with unwashed hands.  . Avoid close contact with people who are sick. . Avoid places or events with large numbers of people in one location, like concerts or  sporting events. . Carefully consider travel plans you have or are making. . If you are planning any travel outside or inside the [place], visit the CDC's Travelers' Health webpage for the latest health notices. . If you have some symptoms but not all symptoms, continue to monitor at home and seek medical attention if your symptoms worsen. . If you are having a medical emergency, call 911.   [place] / e-Visit: [email or web address]         [facility]: [facility]: [phone]                   [facility]: [phone]

## 2018-09-23 NOTE — Assessment & Plan Note (Signed)
He is not symptomatic. Observe only 

## 2018-09-23 NOTE — Assessment & Plan Note (Signed)
Overall, he tolerated treatment very well The area of concern is negative on exam He will proceed with treatment as scheduled He will complete cycle 6 by the end of the month The plan will be to repeat imaging study at the end of next month and for further discussion about plan of care and whether he will continue on maintenance treatment

## 2018-09-23 NOTE — Progress Notes (Signed)
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, he tolerated treatment very well The area of concern is negative on exam He will proceed with treatment as scheduled He will complete cycle 6 by the end of the month The plan will be to repeat imaging study at the end of next month and for further discussion about plan of care and whether he will continue on maintenance treatment  Pancytopenia, acquired [facility]) He is not symptomatic Observe only  Neck discomfort I have examined him in his left neck extensively and could not appreciate any abnormalities on exam The patient is reassured He is informed and instructed to consult me again for further evaluation if he had recurrence of neck pain.   No orders of the defined types were placed in this encounter.   INTERVAL HISTORY: Please see below for problem oriented charting. 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 2. 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 3. Slight reduction in size of  right retroperitoneal evolving hematoma.   03/30/2018 Imaging   Ct neck 1. Extensive adenopathy on the left. The largest node is a level 1/submandibular node measuring 38 x 24 x 22 mm. Largest level 2 node measures 3.2 x 2 x 2 cm. Numerous other smaller but round lymph nodes throughout the left neck in the level 2 through level 4 region. The largest level 5 node measures 3.4 x 3.4 x 2.3 cm with a transverse diameter of 2.3 cm. This contains some internal calcifications. There are numerous other pathologic nodes in the left supraclavicular to axillary region. This pattern of disease could be due to extensive left neck metastatic disease from unknown primary, lymphoma, or other systemic malignancy. 2. No evidence of mucosal or submucosal lesion. 3. Diameter of the ascending aorta is 4.5 cm. Recommend annual imaging followup by CTA or MRA. Aortic Atherosclerosis (ICD10-I70.0).    04/10/2018 Pathology Results   Left zone 1 neck mass, [name], [name], the findings indicate an atypical lymphoid proliferation but are too limited for definitive diagnosis. Excisional biopsy with flow cytometry is recommended.   04/10/2018 Procedure   He was ENT who performed FNA of neck  LN   04/27/2018 Pathology Results   Lymph node for lymphoma, Left zone 2 Cervical - MANTLE CELL LYMPHOMA - SEE COMMENT Microscopic  Comment The biopsies have nodal architectural effacement by a monotonous lymphoid population. The lymphocytes are predominantly small in size with round nuclei and mature, [name], [name], [name], [name], [name], [name], the features are consistent with a mantle cell lymphoma   05/13/2018 PET scan   1. Hypermetabolic left cervical lymphadenopathy, left axillary lymphadenopathy, mediastinal / right hilar lymphadenopathy, right external iliac lymphadenopathy, and subcentimeter left external iliac and left inguinal lymph nodes, [name], [name], [name], without focal lesions, that may represent lymphomatous involvement of the spleen.   05/21/2018 Cancer Staging   Staging form: Hodgkin and Non-Hodgkin Lymphoma, AJCC 8th Edition - Clinical: Stage III - Signed by , , MD on 05/21/2018   05/26/2018 Procedure   Ultrasound and fluoroscopically guided right internal jugular single lumen power port catheter insertion. Tip in the SVC/RA junction. Catheter ready for use.    06/01/2018 -  Chemotherapy   The patient had Bendamustine and Rituximab for chemotherapy treatment   08/24/2018 PET scan   1. Partial response to therapy with complete resolution of size and metabolic activity of the dominant LEFT submandibular node (level 1) and LEFT axillary nodes. 2. Persistent and increased metabolic activity of left level 3 lymph nodes ( Deauville 4). Nodes are partially calcified and similar  size. 3. No new metastatic disease. 4. Normal spleen and bone marrow.     REVIEW OF SYSTEMS:   Constitutional: Denies fevers, chills or abnormal weight loss Eyes: Denies blurriness of vision Ears, nose, mouth, throat, and face: Denies mucositis or sore throat Respiratory: Denies cough, dyspnea or wheezes Cardiovascular: Denies palpitation, chest discomfort or lower extremity swelling Gastrointestinal:  Denies nausea, heartburn or change in bowel habits Skin: Denies abnormal skin rashes Lymphatics: Denies new lymphadenopathy or easy bruising Neurological:Denies numbness, tingling or new weaknesses Behavioral/Psych: Mood is stable, no new changes  All other systems were reviewed with the patient and are negative.  I have reviewed the past medical history, past surgical history, social history and family history with the patient and they are unchanged from previous note.  ALLERGIES:  is allergic to aspirin; levaquin [levofloxacin in d5w]; nickel; statins; allopurinol; and ampicillin.  MEDICATIONS:  Current Outpatient Medications  Medication Sig Dispense Refill  . acetaminophen (TYLENOL) 500 MG tablet Take 1,000 mg by mouth daily as needed for moderate pain.    . acyclovir (ZOVIRAX) 400 MG tablet Take 1 tablet (400 mg total) by mouth daily. 90 tablet 3  . alfuzosin (UROXATRAL) 10 MG 24 hr tablet Take 10 mg by mouth at bedtime.     . buPROPion (WELLBUTRIN XL) 150 MG 24 hr tablet Take 1 tablet (150 mg total) by mouth daily. 90 tablet 1  . Cholecalciferol (VITAMIN D) 2000 units tablet Take 2,000 Units by mouth at bedtime.     . finasteride (PROSCAR) 5 MG tablet Take 5 mg by mouth daily.     . lidocaine-prilocaine (EMLA) cream Apply to affected area   once 30 g 3  . metoprolol succinate (TOPROL-XL) 25 MG 24 hr tablet Take 12.5 mg by mouth daily.     . ondansetron (ZOFRAN) 8 MG tablet Take 1 tablet (8 mg total) by mouth every 8 (eight) hours as needed for refractory nausea / vomiting. (Patient  not taking: Reported on 08/20/2018) 30 tablet 1  . Polyethyl Glycol-Propyl Glycol (SYSTANE OP) Place 1 drop into both eyes 2 (two) times daily.    . prochlorperazine (COMPAZINE) 10 MG tablet Take 1 tablet (10 mg total) by mouth every 6 (six) hours as needed (Nausea or vomiting). (Patient not taking: Reported on 08/20/2018) 30 tablet 1   No current facility-administered medications for this visit.    Facility-Administered Medications Ordered in Other Visits  Medication Dose Route Frequency Provider Last Rate Last Dose  . bendamustine (BENDEKA) 200 mg in sodium chloride 0.9 % 50 mL (3.4483 mg/mL) chemo infusion  90 mg/m2 (Treatment Plan Recorded) Intravenous Once Alvy Bimler, Desree Leap, MD      . heparin lock flush 100 unit/mL  500 Units Intracatheter Once PRN Alvy Bimler, Leman Martinek, MD      . riTUXimab (RITUXAN) 900 mg in sodium chloride 0.9 % 160 mL infusion  375 mg/m2 (Treatment Plan Recorded) Intravenous Once Marlos Carmen, MD      . sodium chloride flush (NS) 0.9 % injection 10 mL  10 mL Intracatheter PRN Alvy Bimler, Irys Nigh, MD        PHYSICAL EXAMINATION: ECOG PERFORMANCE STATUS: 1 - Symptomatic but completely ambulatory  Vitals:   09/23/18 0820  BP: 133/76  Pulse: 74  Resp: 18  Temp: 98.5 F (36.9 C)  SpO2: 99%   Filed Weights   09/23/18 0820  Weight: 216 lb 6.4 oz (98.2 kg)    GENERAL:alert, no distress and comfortable SKIN: skin color, texture, turgor are normal, no rashes or significant lesions EYES: normal, Conjunctiva are pink and non-injected, sclera clear OROPHARYNX:no exudate, no erythema and lips, buccal mucosa, and tongue normal  NECK: supple, thyroid normal size, non-tender, without nodularity LYMPH:  no palpable lymphadenopathy in the cervical, axillary or inguinal LUNGS: clear to auscultation and percussion with normal breathing effort HEART: regular rate & rhythm and no murmurs and no lower extremity edema ABDOMEN:abdomen soft, non-tender and normal bowel sounds Musculoskeletal:no cyanosis  of digits and no clubbing  NEURO: alert & oriented x 3 with fluent speech, no focal motor/sensory deficits  LABORATORY DATA:  I have reviewed the data as listed    Component Value Date/Time   NA 140 09/23/2018 0744   K 4.4 09/23/2018 0744   CL 106 09/23/2018 0744   CO2 25 09/23/2018 0744   GLUCOSE 80 09/23/2018 0744   BUN 19 09/23/2018 0744   CREATININE 1.15 09/23/2018 0744   CALCIUM 8.9 09/23/2018 0744   PROT 6.3 (L) 09/23/2018 0744   PROT 6.6 07/23/2017 1504   ALBUMIN 3.9 09/23/2018 0744   AST 22 09/23/2018 0744   ALT 24 09/23/2018 0744   ALKPHOS 59 09/23/2018 0744   BILITOT 1.1 09/23/2018 0744   GFRNONAA >60 09/23/2018 0744   GFRAA >60 09/23/2018 0744    No results found for: SPEP, UPEP  Lab Results  Component Value Date   WBC 4.5 09/23/2018   NEUTROABS 3.1 09/23/2018   HGB 12.0 (L) 09/23/2018   HCT 36.1 (L) 09/23/2018   MCV 91.4 09/23/2018   PLT 148 (L) 09/23/2018      Chemistry      Component Value Date/Time   NA 140 09/23/2018 0744  K 4.4 09/23/2018 0744   CL 106 09/23/2018 0744   CO2 25 09/23/2018 0744   BUN 19 09/23/2018 0744   CREATININE 1.15 09/23/2018 0744      Component Value Date/Time   CALCIUM 8.9 09/23/2018 0744   ALKPHOS 59 09/23/2018 0744   AST 22 09/23/2018 0744   ALT 24 09/23/2018 0744   BILITOT 1.1 09/23/2018 0744       RADIOGRAPHIC STUDIES: I have personally reviewed the radiological images as listed and agreed with the findings in the report. Nm Pet Image Restag (ps) Skull Base To Thigh  Result Date: 08/24/2018 CLINICAL DATA:  Subsequent treatment strategy for mantle cell lymphoma. EXAM: NUCLEAR MEDICINE PET SKULL BASE TO THIGH TECHNIQUE: 10.3 mCi F-18 FDG was injected intravenously. Full-ring PET imaging was performed from the skull base to thigh after the radiotracer. CT data was obtained and used for attenuation correction and anatomic localization. Fasting blood glucose: 88 mg/dl COMPARISON:  PET-CT 05/13/2018 FINDINGS:  Mediastinal blood pool activity: SUV max 2.6 Liver activity: SUV max 4.4 NECK: The dominant the bulky lymph node in the LEFT neck just anterior to the submandibular gland (level 1) has near completely resolved. No residual metabolic activity. No pathologically enlarged nodes. However LEFT level 3 nodes beneath the sternocleidomastoid muscle measuring 20 mm (image 43/4) are similar in size to prior (21 mm) but have increased metabolic activity SUV max equal 7.6 increased from 4.1. These nodes have partial calcifications. Likewise a calcified node higher in LEFT level II location measuring 8 mm (image 32/4) has SUV max equal 3.6 compared to 3.2. Incidental CT findings: none CHEST: Bulky hypermetabolic LEFT axillary adenopathy seen on prior have completely resolved ( Deauville 1). Likewise resolution of the hypermetabolic is RIGHT hilar nodes and paratracheal nodes. Incidental CT findings: Radiotracer activity associated with the port. No suspicious pulmonary nodules remain. ABDOMEN/PELVIS: No abnormal hypermetabolic activity within the liver, pancreas, adrenal glands, or spleen. No hypermetabolic lymph nodes in the abdomen or pelvis. Incidental CT findings: none SKELETON: No focal hypermetabolic activity to suggest skeletal metastasis. Incidental CT findings: none IMPRESSION: 1. Partial response to therapy with complete resolution of size and metabolic activity of the dominant LEFT submandibular node (level 1) and LEFT axillary nodes. 2. Persistent and increased metabolic activity of left level 3 lymph nodes ( Deauville 4). Nodes are partially calcified and similar size. 3. No new metastatic disease. 4. Normal spleen and bone marrow. Electronically Signed   By: Stewart  Edmunds M.D.   On: 08/24/2018 12:42    All questions were answered. The patient knows to call the clinic with any problems, questions or concerns. No barriers to learning was detected.  I spent 15 minutes counseling the patient face to face. The  total time spent in the appointment was 20 minutes and more than 50% was on counseling and review of test results   , MD 09/23/2018 9:16 AM  

## 2018-09-24 ENCOUNTER — Inpatient Hospital Stay: Payer: Medicare Other

## 2018-09-24 ENCOUNTER — Encounter: Payer: Self-pay | Admitting: Hematology and Oncology

## 2018-09-24 ENCOUNTER — Other Ambulatory Visit: Payer: Self-pay

## 2018-09-24 VITALS — BP 102/55 | HR 58 | Temp 98.0°F | Resp 18

## 2018-09-24 DIAGNOSIS — C8318 Mantle cell lymphoma, lymph nodes of multiple sites: Secondary | ICD-10-CM

## 2018-09-24 DIAGNOSIS — Z5112 Encounter for antineoplastic immunotherapy: Secondary | ICD-10-CM | POA: Diagnosis not present

## 2018-09-24 DIAGNOSIS — Z5111 Encounter for antineoplastic chemotherapy: Secondary | ICD-10-CM | POA: Diagnosis not present

## 2018-09-24 DIAGNOSIS — M542 Cervicalgia: Secondary | ICD-10-CM | POA: Diagnosis not present

## 2018-09-24 DIAGNOSIS — D61818 Other pancytopenia: Secondary | ICD-10-CM | POA: Diagnosis not present

## 2018-09-24 DIAGNOSIS — Z79899 Other long term (current) drug therapy: Secondary | ICD-10-CM | POA: Diagnosis not present

## 2018-09-24 MED ORDER — SODIUM CHLORIDE 0.9 % IV SOLN
Freq: Once | INTRAVENOUS | Status: AC
  Administered 2018-09-24: 08:00:00 via INTRAVENOUS
  Filled 2018-09-24: qty 250

## 2018-09-24 MED ORDER — DEXAMETHASONE SODIUM PHOSPHATE 10 MG/ML IJ SOLN
INTRAMUSCULAR | Status: AC
  Filled 2018-09-24: qty 1

## 2018-09-24 MED ORDER — SODIUM CHLORIDE 0.9 % IV SOLN
90.0000 mg/m2 | Freq: Once | INTRAVENOUS | Status: AC
  Administered 2018-09-24: 09:00:00 200 mg via INTRAVENOUS
  Filled 2018-09-24: qty 8

## 2018-09-24 MED ORDER — DEXAMETHASONE SODIUM PHOSPHATE 10 MG/ML IJ SOLN
10.0000 mg | Freq: Once | INTRAMUSCULAR | Status: AC
  Administered 2018-09-24: 10 mg via INTRAVENOUS

## 2018-09-24 MED ORDER — SODIUM CHLORIDE 0.9% FLUSH
10.0000 mL | INTRAVENOUS | Status: DC | PRN
  Administered 2018-09-24: 10 mL
  Filled 2018-09-24: qty 10

## 2018-09-24 MED ORDER — HEPARIN SOD (PORK) LOCK FLUSH 100 UNIT/ML IV SOLN
500.0000 [IU] | Freq: Once | INTRAVENOUS | Status: AC | PRN
  Administered 2018-09-24: 500 [IU]
  Filled 2018-09-24: qty 5

## 2018-09-24 NOTE — Patient Instructions (Signed)
[facility] Discharge Instructions for Patients Receiving Chemotherapy  Today you received the following chemotherapy agents Bendamustine (BENDEKA).  To help prevent nausea and vomiting after your treatment, we encourage you to take your nausea medication as prescribed.   If you develop nausea and vomiting that is not controlled by your nausea medication, call the clinic.   BELOW ARE SYMPTOMS THAT SHOULD BE REPORTED IMMEDIATELY:  *FEVER GREATER THAN 100.5 F  *CHILLS WITH OR WITHOUT FEVER  NAUSEA AND VOMITING THAT IS NOT CONTROLLED WITH YOUR NAUSEA MEDICATION  *UNUSUAL SHORTNESS OF BREATH  *UNUSUAL BRUISING OR BLEEDING  TENDERNESS IN MOUTH AND THROAT WITH OR WITHOUT PRESENCE OF ULCERS  *URINARY PROBLEMS  *BOWEL PROBLEMS  UNUSUAL RASH Items with * indicate a potential emergency and should be followed up as soon as possible.  Feel free to call the clinic should you have any questions or concerns. The clinic phone number is [phone].  Please show the CHEMO ALERT CARD at check-in to the Emergency Department and triage nurse.  Coronavirus (COVID-19) Are you at risk?  Are you at risk for the Coronavirus (COVID-19)?  To be considered HIGH RISK for Coronavirus (COVID-19), you have to meet the following criteria:  . Traveled to [place], [place], [place], [place] or [place]; or in the [place] to [place], [place], [place], or [place]; and have fever, cough, and shortness of breath within the last 2 weeks of travel OR . Been in close contact with a person diagnosed with COVID-19 within the last 2 weeks and have fever, cough, and shortness of breath . IF YOU DO NOT MEET THESE CRITERIA, YOU ARE CONSIDERED LOW RISK FOR COVID-19.  What to do if you are HIGH RISK for COVID-19?  . If you are having a medical emergency, call 911. . Seek medical care right away. Before you go to a doctor's office, urgent care or emergency department, call ahead and tell them  about your recent travel, contact with someone diagnosed with COVID-19, and your symptoms. You should receive instructions from your physician's office regarding next steps of care.  . When you arrive at healthcare provider, tell the healthcare staff immediately you have returned from visiting [place], [place], [place], [place] or [place]; or traveled in the [place] to [place], [place], [place], or [place]; in the last two weeks or you have been in close contact with a person diagnosed with COVID-19 in the last 2 weeks.   . Tell the health care staff about your symptoms: fever, cough and shortness of breath. . After you have been seen by a medical provider, you will be either: o Tested for (COVID-19) and discharged home on quarantine except to seek medical care if symptoms worsen, and asked to  - Stay home and avoid contact with others until you get your results (4-5 days)  - Avoid travel on public transportation if possible (such as bus, train, or airplane) or o Sent to the Emergency Department by EMS for evaluation, COVID-19 testing, and possible admission depending on your condition and test results.  What to do if you are LOW RISK for COVID-19?  Reduce your risk of any infection by using the same precautions used for avoiding the common cold or flu:  . Wash your hands often with soap and warm water for at least 20 seconds.  If soap and water are not readily available, use an alcohol-based hand sanitizer with at least 60% alcohol.  . If coughing or   sneezing, cover your mouth and nose by coughing or sneezing into the elbow areas of your shirt or coat, into a tissue or into your sleeve (not your hands). . Avoid shaking hands with others and consider head nods or verbal greetings only. . Avoid touching your eyes, nose, or mouth with unwashed hands.  . Avoid close contact with people who are sick. . Avoid places or events with large numbers of people in one location, like concerts or  sporting events. . Carefully consider travel plans you have or are making. . If you are planning any travel outside or inside the [place], visit the CDC's Travelers' Health webpage for the latest health notices. . If you have some symptoms but not all symptoms, continue to monitor at home and seek medical attention if your symptoms worsen. . If you are having a medical emergency, call 911.   [place] / e-Visit: [email or web address]         [facility]: [facility]: [phone]                   [facility]: [phone]

## 2018-09-28 ENCOUNTER — Telehealth: Payer: Self-pay

## 2018-09-28 NOTE — Telephone Encounter (Signed)
Called and given appt for flush only for 7/14 at 10 am. He verbalized understanding.

## 2018-09-28 NOTE — Telephone Encounter (Signed)
-----   Message from Heath Lark, MD sent at 09/28/2018  9:13 AM EDT ----- Regarding: port issues Did any nurses document difficulties with port? Do we need cath flow or IR to evaluate?

## 2018-10-06 ENCOUNTER — Inpatient Hospital Stay: Payer: Medicare Other

## 2018-10-06 ENCOUNTER — Other Ambulatory Visit: Payer: Self-pay

## 2018-10-06 DIAGNOSIS — C8318 Mantle cell lymphoma, lymph nodes of multiple sites: Secondary | ICD-10-CM | POA: Diagnosis not present

## 2018-10-06 DIAGNOSIS — M542 Cervicalgia: Secondary | ICD-10-CM | POA: Diagnosis not present

## 2018-10-06 DIAGNOSIS — D61818 Other pancytopenia: Secondary | ICD-10-CM | POA: Diagnosis not present

## 2018-10-06 DIAGNOSIS — Z79899 Other long term (current) drug therapy: Secondary | ICD-10-CM | POA: Diagnosis not present

## 2018-10-06 DIAGNOSIS — Z5112 Encounter for antineoplastic immunotherapy: Secondary | ICD-10-CM | POA: Diagnosis not present

## 2018-10-06 DIAGNOSIS — Z5111 Encounter for antineoplastic chemotherapy: Secondary | ICD-10-CM | POA: Diagnosis not present

## 2018-10-06 MED ORDER — SODIUM CHLORIDE 0.9% FLUSH
10.0000 mL | Freq: Once | INTRAVENOUS | Status: AC
  Administered 2018-10-06: 10 mL
  Filled 2018-10-06: qty 10

## 2018-10-06 MED ORDER — HEPARIN SOD (PORK) LOCK FLUSH 100 UNIT/ML IV SOLN
250.0000 [IU] | Freq: Once | INTRAVENOUS | Status: AC
  Administered 2018-10-06: 250 [IU]
  Filled 2018-10-06: qty 5

## 2018-10-08 ENCOUNTER — Encounter: Payer: Self-pay | Admitting: Hematology and Oncology

## 2018-10-20 ENCOUNTER — Inpatient Hospital Stay (HOSPITAL_BASED_OUTPATIENT_CLINIC_OR_DEPARTMENT_OTHER): Payer: Medicare Other | Admitting: Hematology and Oncology

## 2018-10-20 ENCOUNTER — Other Ambulatory Visit: Payer: Self-pay

## 2018-10-20 ENCOUNTER — Encounter: Payer: Self-pay | Admitting: Hematology and Oncology

## 2018-10-20 ENCOUNTER — Inpatient Hospital Stay: Payer: Medicare Other

## 2018-10-20 VITALS — BP 147/78 | HR 72 | Temp 98.5°F | Resp 18 | Ht 73.0 in | Wt 215.8 lb

## 2018-10-20 VITALS — BP 119/77 | HR 63 | Temp 98.2°F | Resp 18

## 2018-10-20 DIAGNOSIS — M542 Cervicalgia: Secondary | ICD-10-CM

## 2018-10-20 DIAGNOSIS — D61818 Other pancytopenia: Secondary | ICD-10-CM | POA: Diagnosis not present

## 2018-10-20 DIAGNOSIS — Z5112 Encounter for antineoplastic immunotherapy: Secondary | ICD-10-CM | POA: Diagnosis not present

## 2018-10-20 DIAGNOSIS — C8318 Mantle cell lymphoma, lymph nodes of multiple sites: Secondary | ICD-10-CM

## 2018-10-20 DIAGNOSIS — Z79899 Other long term (current) drug therapy: Secondary | ICD-10-CM | POA: Diagnosis not present

## 2018-10-20 DIAGNOSIS — Z5111 Encounter for antineoplastic chemotherapy: Secondary | ICD-10-CM | POA: Diagnosis not present

## 2018-10-20 LAB — CBC WITH DIFFERENTIAL (CANCER CENTER ONLY)
Abs Immature Granulocytes: 0.04 10*3/uL (ref 0.00–0.07)
Basophils Absolute: 0 10*3/uL (ref 0.0–0.1)
Basophils Relative: 1 %
Eosinophils Absolute: 0.2 10*3/uL (ref 0.0–0.5)
Eosinophils Relative: 5 %
HCT: 38.6 % — ABNORMAL LOW (ref 39.0–52.0)
Hemoglobin: 13.1 g/dL (ref 13.0–17.0)
Immature Granulocytes: 1 %
Lymphocytes Relative: 7 %
Lymphs Abs: 0.3 10*3/uL — ABNORMAL LOW (ref 0.7–4.0)
MCH: 30.8 pg (ref 26.0–34.0)
MCHC: 33.9 g/dL (ref 30.0–36.0)
MCV: 90.8 fL (ref 80.0–100.0)
Monocytes Absolute: 0.7 10*3/uL (ref 0.1–1.0)
Monocytes Relative: 20 %
Neutro Abs: 2.5 10*3/uL (ref 1.7–7.7)
Neutrophils Relative %: 66 %
Platelet Count: 142 10*3/uL — ABNORMAL LOW (ref 150–400)
RBC: 4.25 MIL/uL (ref 4.22–5.81)
RDW: 13.2 % (ref 11.5–15.5)
WBC Count: 3.8 10*3/uL — ABNORMAL LOW (ref 4.0–10.5)
nRBC: 0 % (ref 0.0–0.2)

## 2018-10-20 LAB — CMP (CANCER CENTER ONLY)
ALT: 17 U/L (ref 0–44)
AST: 20 U/L (ref 15–41)
Albumin: 4 g/dL (ref 3.5–5.0)
Alkaline Phosphatase: 57 U/L (ref 38–126)
Anion gap: 8 (ref 5–15)
BUN: 20 mg/dL (ref 8–23)
CO2: 26 mmol/L (ref 22–32)
Calcium: 9.5 mg/dL (ref 8.9–10.3)
Chloride: 106 mmol/L (ref 98–111)
Creatinine: 1.16 mg/dL (ref 0.61–1.24)
GFR, Est AFR Am: >60 mL/min (ref >60)
GFR, Estimated: >60 mL/min (ref >60)
Glucose, Bld: 83 mg/dL (ref 70–99)
Potassium: 4.5 mmol/L (ref 3.5–5.1)
Sodium: 140 mmol/L (ref 135–145)
Total Bilirubin: 1.2 mg/dL (ref 0.3–1.2)
Total Protein: 6.6 g/dL (ref 6.5–8.1)

## 2018-10-20 MED ORDER — DIPHENHYDRAMINE HCL 25 MG PO CAPS
ORAL_CAPSULE | ORAL | Status: AC
  Filled 2018-10-20: qty 2

## 2018-10-20 MED ORDER — HEPARIN SOD (PORK) LOCK FLUSH 100 UNIT/ML IV SOLN
500.0000 [IU] | Freq: Once | INTRAVENOUS | Status: AC | PRN
  Administered 2018-10-20: 500 [IU]
  Filled 2018-10-20: qty 5

## 2018-10-20 MED ORDER — PALONOSETRON HCL INJECTION 0.25 MG/5ML
0.2500 mg | Freq: Once | INTRAVENOUS | Status: AC
  Administered 2018-10-20: 0.25 mg via INTRAVENOUS

## 2018-10-20 MED ORDER — SODIUM CHLORIDE 0.9 % IV SOLN
90.0000 mg/m2 | Freq: Once | INTRAVENOUS | Status: AC
  Administered 2018-10-20: 200 mg via INTRAVENOUS
  Filled 2018-10-20: qty 8

## 2018-10-20 MED ORDER — DEXAMETHASONE SODIUM PHOSPHATE 10 MG/ML IJ SOLN
INTRAMUSCULAR | Status: AC
  Filled 2018-10-20: qty 1

## 2018-10-20 MED ORDER — SODIUM CHLORIDE 0.9 % IV SOLN
375.0000 mg/m2 | Freq: Once | INTRAVENOUS | Status: AC
  Administered 2018-10-20: 900 mg via INTRAVENOUS
  Filled 2018-10-20: qty 40

## 2018-10-20 MED ORDER — DIPHENHYDRAMINE HCL 25 MG PO CAPS
50.0000 mg | ORAL_CAPSULE | Freq: Once | ORAL | Status: AC
  Administered 2018-10-20: 50 mg via ORAL

## 2018-10-20 MED ORDER — SODIUM CHLORIDE 0.9 % IV SOLN
Freq: Once | INTRAVENOUS | Status: AC
  Administered 2018-10-20: 11:00:00 via INTRAVENOUS
  Filled 2018-10-20: qty 250

## 2018-10-20 MED ORDER — ACETAMINOPHEN 325 MG PO TABS
650.0000 mg | ORAL_TABLET | Freq: Once | ORAL | Status: AC
  Administered 2018-10-20: 650 mg via ORAL

## 2018-10-20 MED ORDER — PALONOSETRON HCL INJECTION 0.25 MG/5ML
INTRAVENOUS | Status: AC
  Filled 2018-10-20: qty 5

## 2018-10-20 MED ORDER — SODIUM CHLORIDE 0.9% FLUSH
10.0000 mL | Freq: Once | INTRAVENOUS | Status: AC
  Administered 2018-10-20: 10 mL
  Filled 2018-10-20: qty 10

## 2018-10-20 MED ORDER — SODIUM CHLORIDE 0.9% FLUSH
10.0000 mL | INTRAVENOUS | Status: DC | PRN
  Administered 2018-10-20: 10 mL
  Filled 2018-10-20: qty 10

## 2018-10-20 MED ORDER — DEXAMETHASONE SODIUM PHOSPHATE 10 MG/ML IJ SOLN
10.0000 mg | Freq: Once | INTRAMUSCULAR | Status: AC
  Administered 2018-10-20: 10 mg via INTRAVENOUS

## 2018-10-20 MED ORDER — ACETAMINOPHEN 325 MG PO TABS
ORAL_TABLET | ORAL | Status: AC
  Filled 2018-10-20: qty 2

## 2018-10-20 NOTE — Patient Instructions (Signed)
[facility] Discharge Instructions for Patients Receiving Chemotherapy  Today you received the following chemotherapy agents: Rituxan and Bendeka.  To help prevent nausea and vomiting after your treatment, we encourage you to take your nausea medication as directed by your MD.   If you develop nausea and vomiting that is not controlled by your nausea medication, call the clinic.   BELOW ARE SYMPTOMS THAT SHOULD BE REPORTED IMMEDIATELY:  *FEVER GREATER THAN 100.5 F  *CHILLS WITH OR WITHOUT FEVER  NAUSEA AND VOMITING THAT IS NOT CONTROLLED WITH YOUR NAUSEA MEDICATION  *UNUSUAL SHORTNESS OF BREATH  *UNUSUAL BRUISING OR BLEEDING  TENDERNESS IN MOUTH AND THROAT WITH OR WITHOUT PRESENCE OF ULCERS  *URINARY PROBLEMS  *BOWEL PROBLEMS  UNUSUAL RASH Items with * indicate a potential emergency and should be followed up as soon as possible.  Feel free to call the clinic should you have any questions or concerns. The clinic phone number is [phone].  Please show the [place] at check-in to the Emergency Department and triage nurse.  Coronavirus (COVID-19) Are you at risk?  Are you at risk for the Coronavirus (COVID-19)?  To be considered HIGH RISK for Coronavirus (COVID-19), you have to meet the following criteria:  . Traveled to [place], [place], [place], [place] or [place]; or in the [place] to [place], [place], [place], or [place]; and have fever, cough, and shortness of breath within the last 2 weeks of travel OR . Been in close contact with a person diagnosed with COVID-19 within the last 2 weeks and have fever, cough, and shortness of breath . IF YOU DO NOT MEET THESE CRITERIA, YOU ARE CONSIDERED LOW RISK FOR COVID-19.  What to do if you are HIGH RISK for COVID-19?  Marland Kitchen If you are having a medical emergency, call 911. . Seek medical care right away. Before you go to a doctor's office, urgent care or emergency department, call ahead and  tell them about your recent travel, contact with someone diagnosed with COVID-19, and your symptoms. You should receive instructions from your physician's office regarding next steps of care.  . When you arrive at healthcare provider, tell the healthcare staff immediately you have returned from visiting [place], [place], [place], [place] or [place]; or traveled in the [place] to [place], [place], [place], or [place]; in the last two weeks or you have been in close contact with a person diagnosed with COVID-19 in the last 2 weeks.   . Tell the health care staff about your symptoms: fever, cough and shortness of breath. . After you have been seen by a medical provider, you will be either: o Tested for (COVID-19) and discharged home on quarantine except to seek medical care if symptoms worsen, and asked to  - Stay home and avoid contact with others until you get your results (4-5 days)  - Avoid travel on public transportation if possible (such as bus, train, or airplane) or o Sent to the Emergency Department by EMS for evaluation, COVID-19 testing, and possible admission depending on your condition and test results.  What to do if you are LOW RISK for COVID-19?  Reduce your risk of any infection by using the same precautions used for avoiding the common cold or flu:  Marland Kitchen Wash your hands often with soap and warm water for at least 20 seconds.  If soap and water are not readily available, use an alcohol-based hand sanitizer with at least 60% alcohol.  Marland Kitchen  If coughing or sneezing, cover your mouth and nose by coughing or sneezing into the elbow areas of your shirt or coat, into a tissue or into your sleeve (not your hands). . Avoid shaking hands with others and consider head nods or verbal greetings only. . Avoid touching your eyes, nose, or mouth with unwashed hands.  . Avoid close contact with people who are sick. . Avoid places or events with large numbers of people in one location, like  concerts or sporting events. . Carefully consider travel plans you have or are making. . If you are planning any travel outside or inside the [place], visit the CDC's Travelers' Health webpage for the latest health notices. . If you have some symptoms but not all symptoms, continue to monitor at home and seek medical attention if your symptoms worsen. . If you are having a medical emergency, call 911.   [place] / e-Visit: [email or web address]         [facility]: [facility]: [phone]                   [facility]: [phone]

## 2018-10-20 NOTE — Assessment & Plan Note (Signed)
He is not symptomatic. Observe only 

## 2018-10-20 NOTE — Assessment & Plan Note (Signed)
He continues to have occasional neck discomfort Examination is benign He is reassured

## 2018-10-20 NOTE — Assessment & Plan Note (Signed)
He tolerated treatment very well without major side effects from last cycle of therapy We will proceed with treatment as schedule I plan to repeat PET CT scan in about 3 weeks He agreed with the plan of care

## 2018-10-20 NOTE — Progress Notes (Signed)
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 NM PET Image Restag (PS) Skull Base To Thigh    Standing Status:   Future    Standing Expiration Date:   10/20/2019    Order Specific Question:   If indicated for the ordered procedure, I authorize the administration of a radiopharmaceutical per Radiology protocol    Answer:   Yes    Order Specific Question:   Preferred imaging location?    Answer:   [facility]    Order Specific Question:   Radiology Contrast Protocol - do NOT remove file path    Answer:   [email or web address]    INTERVAL HISTORY: Please see below for problem oriented charting. He returns for further follow-up He feels well He tolerated last cycle of treatment well without any side effects He continues to have mild intermittent neck discomfort at prior surgical excision site  SUMMARY OF ONCOLOGIC HISTORY: Oncology History Overview Note  MIPI score 7.5 high risk   Mantle cell lymphoma ([place])  06/13/2017 Imaging   Ct imaging at [facility] 1. 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 2. 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 3. Slight reduction in size of right retroperitoneal evolving hematoma.   03/30/2018 Imaging   Ct neck 1. Extensive adenopathy on the left. The largest node is a level 1/submandibular node measuring 38 x 24 x 22 mm. Largest level 2 node measures 3.2 x 2 x 2 cm. Numerous other smaller but round lymph nodes throughout the left neck in the level 2 through level 4 region. The largest level 5 node measures 3.4 x 3.4 x 2.3 cm with a transverse diameter of 2.3 cm. This contains some internal calcifications. There are numerous other pathologic nodes in the left supraclavicular to axillary region. This pattern of disease could be due to extensive left neck metastatic disease from unknown primary, lymphoma, or other systemic malignancy. 2. No evidence of mucosal or submucosal lesion. 3. Diameter of the ascending aorta is 4.5 cm. Recommend annual imaging followup by CTA or MRA. Aortic Atherosclerosis (ICD10-I70.0).    04/10/2018 Pathology Results   Left zone 1 neck mass, [name], [name], the findings indicate an atypical lymphoid proliferation but are too limited for  definitive diagnosis. Excisional biopsy with flow cytometry is recommended.   04/10/2018 Procedure   He was ENT who performed FNA of neck LN   04/27/2018 Pathology Results   Lymph node for lymphoma, Left zone  2 Cervical - MANTLE CELL LYMPHOMA - SEE COMMENT Microscopic Comment The biopsies have nodal architectural effacement by a monotonous lymphoid population. The lymphocytes are predominantly small in size with round nuclei and mature, [name], [name], [name], [name], [name], [name], the features are consistent with a mantle cell lymphoma   05/13/2018 PET scan   1. Hypermetabolic left cervical lymphadenopathy, left axillary lymphadenopathy, mediastinal / right hilar lymphadenopathy, right external iliac lymphadenopathy, and subcentimeter left external iliac and left inguinal lymph nodes, [name], [name], [name], without focal lesions, that may represent lymphomatous involvement of the spleen.   05/21/2018 Cancer Staging   Staging form: Hodgkin and Non-Hodgkin Lymphoma, AJCC 8th Edition - Clinical: Stage III - Signed by Heath Lark, MD on 05/21/2018   05/26/2018 Procedure   Ultrasound and fluoroscopically guided right internal jugular single lumen power port catheter insertion. Tip in the SVC/RA junction. Catheter ready for use.    06/01/2018 -  Chemotherapy   The patient had Bendamustine and Rituximab for chemotherapy treatment   08/24/2018 PET scan   1. Partial response to therapy with complete resolution of size and metabolic activity of the dominant LEFT submandibular node (level 1) and LEFT axillary nodes. 2. Persistent and increased metabolic activity of left level 3 lymph nodes (  [place] 4). Nodes are partially calcified and similar size. 3. No new metastatic disease. 4. Normal spleen and bone marrow.     REVIEW OF SYSTEMS:   Constitutional: Denies fevers, chills or abnormal weight loss Eyes: Denies blurriness of vision Ears, nose, mouth, throat, and face: Denies mucositis or sore throat Respiratory: Denies cough, dyspnea or wheezes Cardiovascular: Denies palpitation, chest discomfort or lower extremity swelling Gastrointestinal:  Denies nausea, heartburn or change in bowel habits Skin: Denies abnormal skin rashes Lymphatics: Denies new lymphadenopathy or easy bruising Neurological:Denies numbness, tingling or new weaknesses Behavioral/Psych: Mood is stable, no new changes  All other systems were reviewed with the patient and are negative.  I have reviewed the past medical history, past surgical history, social history and family history with the patient and they are unchanged from previous note.  ALLERGIES:  is allergic to aspirin; levaquin [levofloxacin in d5w]; nickel; statins; allopurinol; and ampicillin.  MEDICATIONS:  Current Outpatient Medications  Medication Sig Dispense Refill  . acetaminophen (TYLENOL) 500 MG tablet Take 1,000 mg by mouth daily as needed for moderate pain.    Marland Kitchen acyclovir (ZOVIRAX) 400 MG tablet Take 1 tablet (400 mg total) by mouth daily. 90 tablet 3  . alfuzosin (UROXATRAL) 10 MG 24 hr tablet Take 10 mg by mouth at bedtime.     Marland Kitchen buPROPion (WELLBUTRIN XL) 150 MG 24 hr tablet Take 1 tablet (150 mg total) by mouth daily. 90 tablet 1  . Cholecalciferol (VITAMIN D) 2000 units tablet Take 2,000 Units by mouth at bedtime.     Marland Kitchen  finasteride (PROSCAR) 5 MG tablet Take 5 mg by mouth daily.     Marland Kitchen lidocaine-prilocaine (EMLA) cream Apply to affected area once 30 g 3  . metoprolol succinate (TOPROL-XL) 25 MG 24 hr tablet Take 12.5 mg by mouth daily.     . ondansetron (ZOFRAN) 8 MG tablet Take 1 tablet (8 mg total) by mouth every 8 (eight)  hours as needed for refractory nausea / vomiting. (Patient not taking: Reported on 08/20/2018) 30 tablet 1  . Polyethyl Glycol-Propyl Glycol (SYSTANE OP) Place 1 drop into both eyes 2 (two) times daily.    . prochlorperazine (COMPAZINE) 10 MG tablet Take 1 tablet (10 mg total) by mouth every 6 (six) hours as needed (Nausea or vomiting). (Patient not taking: Reported on 08/20/2018) 30 tablet 1   No current facility-administered medications for this visit.    Facility-Administered Medications Ordered in Other Visits  Medication Dose Route Frequency Provider Last Rate Last Dose  . bendamustine (BENDEKA) 200 mg in sodium chloride 0.9 % 50 mL (3.4483 mg/mL) chemo infusion  90 mg/m2 (Treatment Plan Recorded) Intravenous Once Alvy Bimler, Jermari Tamargo, MD      . heparin lock flush 100 unit/mL  500 Units Intracatheter Once PRN Alvy Bimler, Suzi Hernan, MD      . sodium chloride flush (NS) 0.9 % injection 10 mL  10 mL Intracatheter PRN Alvy Bimler, Refoel Palladino, MD        PHYSICAL EXAMINATION: ECOG PERFORMANCE STATUS: 0 - Asymptomatic  Vitals:   10/20/18 1005  BP: (!) 147/78  Pulse: 72  Resp: 18  Temp: 98.5 F (36.9 C)  SpO2: 98%   Filed Weights   10/20/18 1005  Weight: 215 lb 12.8 oz (97.9 kg)    GENERAL:alert, no distress and comfortable SKIN: skin color, texture, turgor are normal, no rashes or significant lesions EYES: normal, Conjunctiva are pink and non-injected, sclera clear OROPHARYNX:no exudate, no erythema and lips, buccal mucosa, and tongue normal  NECK: supple, thyroid normal size, non-tender, without nodularity LYMPH:  no palpable lymphadenopathy in the cervical, axillary or inguinal LUNGS: clear to auscultation and percussion with normal breathing effort HEART: regular rate & rhythm and no murmurs and no lower extremity edema ABDOMEN:abdomen soft, non-tender and normal bowel sounds Musculoskeletal:no cyanosis of digits and no clubbing  NEURO: alert & oriented x 3 with fluent speech, no focal motor/sensory  deficits  LABORATORY DATA:  I have reviewed the data as listed    Component Value Date/Time   NA 140 10/20/2018 0934   K 4.5 10/20/2018 0934   CL 106 10/20/2018 0934   CO2 26 10/20/2018 0934   GLUCOSE 83 10/20/2018 0934   BUN 20 10/20/2018 0934   CREATININE 1.16 10/20/2018 0934   CALCIUM 9.5 10/20/2018 0934   PROT 6.6 10/20/2018 0934   PROT 6.6 07/23/2017 1504   ALBUMIN 4.0 10/20/2018 0934   AST 20 10/20/2018 0934   ALT 17 10/20/2018 0934   ALKPHOS 57 10/20/2018 0934   BILITOT 1.2 10/20/2018 0934   GFRNONAA >60 10/20/2018 0934   GFRAA >60 10/20/2018 0934    No results found for: SPEP, UPEP  Lab Results  Component Value Date   WBC 3.8 (L) 10/20/2018   NEUTROABS 2.5 10/20/2018   HGB 13.1 10/20/2018   HCT 38.6 (L) 10/20/2018   MCV 90.8 10/20/2018   PLT 142 (L) 10/20/2018      Chemistry      Component Value Date/Time   NA 140 10/20/2018 0934   K 4.5 10/20/2018 0934   CL 106 10/20/2018  0934   CO2 26 10/20/2018 0934   BUN 20 10/20/2018 0934   CREATININE 1.16 10/20/2018 0934      Component Value Date/Time   CALCIUM 9.5 10/20/2018 0934   ALKPHOS 57 10/20/2018 0934   AST 20 10/20/2018 0934   ALT 17 10/20/2018 0934   BILITOT 1.2 10/20/2018 0934       All questions were answered. The patient knows to call the clinic with any problems, questions or concerns. No barriers to learning was detected.  I spent 15 minutes counseling the patient face to face. The total time spent in the appointment was 20 minutes and more than 50% was on counseling and review of test results  Heath Lark, MD 10/20/2018 11:46 AM

## 2018-10-21 ENCOUNTER — Telehealth: Payer: Self-pay | Admitting: Hematology and Oncology

## 2018-10-21 ENCOUNTER — Other Ambulatory Visit: Payer: Self-pay

## 2018-10-21 ENCOUNTER — Inpatient Hospital Stay: Payer: Medicare Other

## 2018-10-21 VITALS — BP 106/66 | HR 67 | Temp 97.6°F | Resp 18

## 2018-10-21 DIAGNOSIS — Z5112 Encounter for antineoplastic immunotherapy: Secondary | ICD-10-CM | POA: Diagnosis not present

## 2018-10-21 DIAGNOSIS — Z79899 Other long term (current) drug therapy: Secondary | ICD-10-CM | POA: Diagnosis not present

## 2018-10-21 DIAGNOSIS — D61818 Other pancytopenia: Secondary | ICD-10-CM | POA: Diagnosis not present

## 2018-10-21 DIAGNOSIS — C8318 Mantle cell lymphoma, lymph nodes of multiple sites: Secondary | ICD-10-CM | POA: Diagnosis not present

## 2018-10-21 DIAGNOSIS — M542 Cervicalgia: Secondary | ICD-10-CM | POA: Diagnosis not present

## 2018-10-21 DIAGNOSIS — Z5111 Encounter for antineoplastic chemotherapy: Secondary | ICD-10-CM | POA: Diagnosis not present

## 2018-10-21 MED ORDER — ALTEPLASE 2 MG IJ SOLR
INTRAMUSCULAR | Status: AC
  Filled 2018-10-21: qty 2

## 2018-10-21 MED ORDER — SODIUM CHLORIDE 0.9 % IV SOLN
Freq: Once | INTRAVENOUS | Status: AC
  Administered 2018-10-21: 10:00:00 via INTRAVENOUS
  Filled 2018-10-21: qty 250

## 2018-10-21 MED ORDER — ALTEPLASE 2 MG IJ SOLR
2.0000 mg | Freq: Once | INTRAMUSCULAR | Status: AC
  Administered 2018-10-21: 2 mg
  Filled 2018-10-21: qty 2

## 2018-10-21 MED ORDER — HEPARIN SOD (PORK) LOCK FLUSH 100 UNIT/ML IV SOLN
500.0000 [IU] | Freq: Once | INTRAVENOUS | Status: AC | PRN
  Administered 2018-10-21: 500 [IU]
  Filled 2018-10-21: qty 5

## 2018-10-21 MED ORDER — SODIUM CHLORIDE 0.9 % IV SOLN
90.0000 mg/m2 | Freq: Once | INTRAVENOUS | Status: AC
  Administered 2018-10-21: 10:00:00 200 mg via INTRAVENOUS
  Filled 2018-10-21: qty 8

## 2018-10-21 MED ORDER — DEXAMETHASONE SODIUM PHOSPHATE 10 MG/ML IJ SOLN
INTRAMUSCULAR | Status: AC
  Filled 2018-10-21: qty 1

## 2018-10-21 MED ORDER — DEXAMETHASONE SODIUM PHOSPHATE 10 MG/ML IJ SOLN
10.0000 mg | Freq: Once | INTRAMUSCULAR | Status: AC
  Administered 2018-10-21: 10 mg via INTRAVENOUS

## 2018-10-21 MED ORDER — SODIUM CHLORIDE 0.9% FLUSH
10.0000 mL | INTRAVENOUS | Status: DC | PRN
  Administered 2018-10-21: 10 mL
  Filled 2018-10-21: qty 10

## 2018-10-21 NOTE — Telephone Encounter (Signed)
I could not reach regarding 8/18

## 2018-10-21 NOTE — Patient Instructions (Signed)
[facility] Discharge Instructions for Patients Receiving Chemotherapy  Today you received the following chemotherapy agents Bendamustine (BENDEKA).  To help prevent nausea and vomiting after your treatment, we encourage you to take your nausea medication as prescribed.   If you develop nausea and vomiting that is not controlled by your nausea medication, call the clinic.   BELOW ARE SYMPTOMS THAT SHOULD BE REPORTED IMMEDIATELY:  *FEVER GREATER THAN 100.5 F  *CHILLS WITH OR WITHOUT FEVER  NAUSEA AND VOMITING THAT IS NOT CONTROLLED WITH YOUR NAUSEA MEDICATION  *UNUSUAL SHORTNESS OF BREATH  *UNUSUAL BRUISING OR BLEEDING  TENDERNESS IN MOUTH AND THROAT WITH OR WITHOUT PRESENCE OF ULCERS  *URINARY PROBLEMS  *BOWEL PROBLEMS  UNUSUAL RASH Items with * indicate a potential emergency and should be followed up as soon as possible.  Feel free to call the clinic should you have any questions or concerns. The clinic phone number is [phone].  Please show the CHEMO ALERT CARD at check-in to the Emergency Department and triage nurse.  Coronavirus (COVID-19) Are you at risk?  Are you at risk for the Coronavirus (COVID-19)?  To be considered HIGH RISK for Coronavirus (COVID-19), you have to meet the following criteria:  . Traveled to [place], [place], [place], [place] or [place]; or in the [place] to [place], [place], [place], or [place]; and have fever, cough, and shortness of breath within the last 2 weeks of travel OR . Been in close contact with a person diagnosed with COVID-19 within the last 2 weeks and have fever, cough, and shortness of breath . IF YOU DO NOT MEET THESE CRITERIA, YOU ARE CONSIDERED LOW RISK FOR COVID-19.  What to do if you are HIGH RISK for COVID-19?  . If you are having a medical emergency, call 911. . Seek medical care right away. Before you go to a doctor's office, urgent care or emergency department, call ahead and tell them  about your recent travel, contact with someone diagnosed with COVID-19, and your symptoms. You should receive instructions from your physician's office regarding next steps of care.  . When you arrive at healthcare provider, tell the healthcare staff immediately you have returned from visiting [place], [place], [place], [place] or [place]; or traveled in the [place] to [place], [place], [place], or [place]; in the last two weeks or you have been in close contact with a person diagnosed with COVID-19 in the last 2 weeks.   . Tell the health care staff about your symptoms: fever, cough and shortness of breath. . After you have been seen by a medical provider, you will be either: o Tested for (COVID-19) and discharged home on quarantine except to seek medical care if symptoms worsen, and asked to  - Stay home and avoid contact with others until you get your results (4-5 days)  - Avoid travel on public transportation if possible (such as bus, train, or airplane) or o Sent to the Emergency Department by EMS for evaluation, COVID-19 testing, and possible admission depending on your condition and test results.  What to do if you are LOW RISK for COVID-19?  Reduce your risk of any infection by using the same precautions used for avoiding the common cold or flu:  . Wash your hands often with soap and warm water for at least 20 seconds.  If soap and water are not readily available, use an alcohol-based hand sanitizer with at least 60% alcohol.  . If coughing or   sneezing, cover your mouth and nose by coughing or sneezing into the elbow areas of your shirt or coat, into a tissue or into your sleeve (not your hands). . Avoid shaking hands with others and consider head nods or verbal greetings only. . Avoid touching your eyes, nose, or mouth with unwashed hands.  . Avoid close contact with people who are sick. . Avoid places or events with large numbers of people in one location, like concerts or  sporting events. . Carefully consider travel plans you have or are making. . If you are planning any travel outside or inside the [place], visit the CDC's Travelers' Health webpage for the latest health notices. . If you have some symptoms but not all symptoms, continue to monitor at home and seek medical attention if your symptoms worsen. . If you are having a medical emergency, call 911.   [place] / e-Visit: [email or web address]         [facility]: [facility]: [phone]                   [facility]: [phone]

## 2018-10-26 ENCOUNTER — Encounter: Payer: Self-pay | Admitting: Hematology and Oncology

## 2018-10-28 ENCOUNTER — Telehealth: Payer: Self-pay | Admitting: Hematology and Oncology

## 2018-10-28 NOTE — Telephone Encounter (Signed)
R/s appt per 8/05 sch message - unable to reach pt . Left message with appt date and time

## 2018-11-02 ENCOUNTER — Encounter: Payer: Self-pay | Admitting: Hematology and Oncology

## 2018-11-03 ENCOUNTER — Other Ambulatory Visit: Payer: Self-pay | Admitting: *Deleted

## 2018-11-03 ENCOUNTER — Other Ambulatory Visit: Payer: Self-pay | Admitting: Hematology and Oncology

## 2018-11-03 DIAGNOSIS — E785 Hyperlipidemia, unspecified: Secondary | ICD-10-CM | POA: Insufficient documentation

## 2018-11-03 DIAGNOSIS — C8318 Mantle cell lymphoma, lymph nodes of multiple sites: Secondary | ICD-10-CM

## 2018-11-07 ENCOUNTER — Other Ambulatory Visit: Payer: Self-pay | Admitting: Neurology

## 2018-11-09 ENCOUNTER — Other Ambulatory Visit: Payer: Self-pay

## 2018-11-09 ENCOUNTER — Ambulatory Visit (HOSPITAL_COMMUNITY)
Admission: RE | Admit: 2018-11-09 | Discharge: 2018-11-09 | Disposition: A | Discharge status: Home / Self Care | Payer: Medicare Other | Source: Ambulatory Visit | Attending: Hematology and Oncology | Admitting: Hematology and Oncology

## 2018-11-09 ENCOUNTER — Inpatient Hospital Stay: Payer: Medicare Other

## 2018-11-09 ENCOUNTER — Inpatient Hospital Stay: Payer: Medicare Other | Attending: Hematology and Oncology

## 2018-11-09 DIAGNOSIS — C8318 Mantle cell lymphoma, lymph nodes of multiple sites: Secondary | ICD-10-CM | POA: Diagnosis not present

## 2018-11-09 DIAGNOSIS — I2510 Atherosclerotic heart disease of native coronary artery without angina pectoris: Secondary | ICD-10-CM | POA: Diagnosis not present

## 2018-11-09 DIAGNOSIS — E785 Hyperlipidemia, unspecified: Secondary | ICD-10-CM

## 2018-11-09 DIAGNOSIS — C8310 Mantle cell lymphoma, unspecified site: Secondary | ICD-10-CM | POA: Diagnosis not present

## 2018-11-09 DIAGNOSIS — D61818 Other pancytopenia: Secondary | ICD-10-CM | POA: Insufficient documentation

## 2018-11-09 DIAGNOSIS — I700 Atherosclerosis of aorta: Secondary | ICD-10-CM | POA: Insufficient documentation

## 2018-11-09 LAB — LIPID PANEL
Cholesterol: 276 mg/dL — ABNORMAL HIGH (ref 0–200)
HDL: 65 mg/dL (ref >40)
LDL Cholesterol: 193 mg/dL — ABNORMAL HIGH (ref 0–99)
Total CHOL/HDL Ratio: 4.2 RATIO
Triglycerides: 90 mg/dL (ref <150)
VLDL: 18 mg/dL (ref 0–40)

## 2018-11-09 LAB — COMPREHENSIVE METABOLIC PANEL
ALT: 20 U/L (ref 0–44)
AST: 18 U/L (ref 15–41)
Albumin: 4 g/dL (ref 3.5–5.0)
Alkaline Phosphatase: 51 U/L (ref 38–126)
Anion gap: 10 (ref 5–15)
BUN: 16 mg/dL (ref 8–23)
CO2: 23 mmol/L (ref 22–32)
Calcium: 8.8 mg/dL — ABNORMAL LOW (ref 8.9–10.3)
Chloride: 107 mmol/L (ref 98–111)
Creatinine, Ser: 1.13 mg/dL (ref 0.61–1.24)
GFR calc Af Amer: >60 mL/min (ref >60)
GFR calc non Af Amer: >60 mL/min (ref >60)
Glucose, Bld: 93 mg/dL (ref 70–99)
Potassium: 4.2 mmol/L (ref 3.5–5.1)
Sodium: 140 mmol/L (ref 135–145)
Total Bilirubin: 0.7 mg/dL (ref 0.3–1.2)
Total Protein: 6.6 g/dL (ref 6.5–8.1)

## 2018-11-09 LAB — CBC WITH DIFFERENTIAL/PLATELET
Abs Immature Granulocytes: 0.04 10*3/uL (ref 0.00–0.07)
Basophils Absolute: 0 10*3/uL (ref 0.0–0.1)
Basophils Relative: 1 %
Eosinophils Absolute: 0.1 10*3/uL (ref 0.0–0.5)
Eosinophils Relative: 4 %
HCT: 38 % — ABNORMAL LOW (ref 39.0–52.0)
Hemoglobin: 12.8 g/dL — ABNORMAL LOW (ref 13.0–17.0)
Immature Granulocytes: 1 %
Lymphocytes Relative: 5 %
Lymphs Abs: 0.2 10*3/uL — ABNORMAL LOW (ref 0.7–4.0)
MCH: 31.1 pg (ref 26.0–34.0)
MCHC: 33.7 g/dL (ref 30.0–36.0)
MCV: 92.5 fL (ref 80.0–100.0)
Monocytes Absolute: 0.8 10*3/uL (ref 0.1–1.0)
Monocytes Relative: 21 %
Neutro Abs: 2.6 10*3/uL (ref 1.7–7.7)
Neutrophils Relative %: 68 %
Platelets: 163 10*3/uL (ref 150–400)
RBC: 4.11 MIL/uL — ABNORMAL LOW (ref 4.22–5.81)
RDW: 13.6 % (ref 11.5–15.5)
WBC: 3.8 10*3/uL — ABNORMAL LOW (ref 4.0–10.5)
nRBC: 0 % (ref 0.0–0.2)

## 2018-11-09 LAB — GLUCOSE, CAPILLARY: Glucose-Capillary: 95 mg/dL (ref 70–99)

## 2018-11-09 LAB — LACTATE DEHYDROGENASE: LDH: 190 U/L (ref 98–192)

## 2018-11-09 MED ORDER — FLUDEOXYGLUCOSE F - 18 (FDG) INJECTION
10.7400 | Freq: Once | INTRAVENOUS | Status: AC | PRN
  Administered 2018-11-09: 10.74 via INTRAVENOUS

## 2018-11-09 MED ORDER — FLUDEOXYGLUCOSE F - 18 (FDG) INJECTION: 10.7400 | Freq: Once | INTRAVENOUS | Status: DC | PRN

## 2018-11-10 ENCOUNTER — Inpatient Hospital Stay (HOSPITAL_BASED_OUTPATIENT_CLINIC_OR_DEPARTMENT_OTHER): Payer: Medicare Other | Admitting: Hematology and Oncology

## 2018-11-10 ENCOUNTER — Other Ambulatory Visit: Payer: Self-pay

## 2018-11-10 ENCOUNTER — Encounter: Payer: Self-pay | Admitting: Hematology and Oncology

## 2018-11-10 DIAGNOSIS — D61818 Other pancytopenia: Secondary | ICD-10-CM | POA: Diagnosis not present

## 2018-11-10 DIAGNOSIS — C8318 Mantle cell lymphoma, lymph nodes of multiple sites: Secondary | ICD-10-CM

## 2018-11-10 NOTE — Assessment & Plan Note (Signed)
This is related to recent treatment He is not symptomatic Observe only

## 2018-11-10 NOTE — Progress Notes (Signed)
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[facility]) I have reviewed the PET CT scan extensively with the patient I have also discussed this case at the hematology tumor board this morning Due to persistent abnormal activity and lymphadenopathy seen on the left side of his neck, I recommend repeat excisional biopsy through ENT service I will attempt to call the ENT physician today to expedite the work-up I explained to the patient and his wife the rationale of pursuing repeat biopsy to exclude different pathology The mild hypermetabolism seen on his chest wall did not correlate with any other findings and I reassured the patient I will call him back to schedule return appointment as soon as we have results back  Pancytopenia, acquired [facility]) This is related to recent treatment He is not symptomatic Observe only   No orders of the defined types were placed in this encounter.   INTERVAL HISTORY: Please see below for problem oriented charting. He returns to review PET CT scan imaging He tolerated last cycle of treatment well without any side effects His wife, [name] is also available over the telephone to review the plan of care  SUMMARY OF ONCOLOGIC HISTORY: Oncology History Overview Note  MIPI score 7.5 high risk   Mantle cell lymphoma ([place])  06/13/2017 Imaging   Ct imaging at [facility] 1. 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 2. 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 3. Slight reduction in size of right retroperitoneal evolving hematoma.   03/30/2018 Imaging    Ct neck 1. Extensive adenopathy on the left. The largest node is a level 1/submandibular node measuring 38 x 24 x 22 mm. Largest level 2 node measures 3.2 x 2 x 2 cm. Numerous other smaller but round lymph nodes throughout the left neck in the level 2 through level 4 region. The largest level 5 node measures 3.4 x 3.4 x 2.3 cm with a transverse diameter of 2.3 cm. This contains some internal calcifications. There are numerous other pathologic nodes in the left supraclavicular to axillary region. This pattern of disease could be due to extensive left neck metastatic disease from unknown primary, lymphoma, or other systemic malignancy. 2. No evidence of mucosal or submucosal lesion. 3. Diameter of the ascending aorta is 4.5 cm. Recommend annual imaging followup by CTA or MRA. Aortic Atherosclerosis (ICD10-I70.0).    04/10/2018 Pathology Results   Left zone 1 neck mass, [name], [name], the findings indicate an atypical lymphoid proliferation but are too limited for definitive diagnosis. Excisional biopsy with flow cytometry is recommended.   04/10/2018 Procedure   He was ENT who performed FNA of neck LN   04/27/2018 Pathology Results   Lymph  node for lymphoma, Left zone 2 Cervical - MANTLE CELL LYMPHOMA - SEE COMMENT Microscopic Comment The biopsies have nodal architectural effacement by a  monotonous lymphoid population. The lymphocytes are predominantly small in size with round nuclei and mature, [name], [name], [name], [name], [name], [name], the features are consistent with a mantle cell lymphoma   05/13/2018 PET scan   1. Hypermetabolic left cervical lymphadenopathy, left axillary lymphadenopathy, mediastinal / right hilar lymphadenopathy, right external iliac lymphadenopathy, and subcentimeter left external iliac and left inguinal lymph nodes, [name], [name], [name], without focal lesions, that may represent lymphomatous involvement of the spleen.   05/21/2018 Cancer Staging   Staging form: Hodgkin and Non-Hodgkin Lymphoma, AJCC 8th Edition - Clinical: Stage III - Signed by Heath Lark, MD on 05/21/2018   05/26/2018 Procedure   Ultrasound and fluoroscopically guided right internal jugular single lumen power port catheter insertion. Tip in the SVC/RA junction. Catheter ready for use.    06/01/2018 -  Chemotherapy   The patient had Bendamustine and Rituximab for chemotherapy treatment   08/24/2018 PET scan   1. Partial response to therapy with complete resolution of size and metabolic activity of the dominant LEFT submandibular node (level 1) and LEFT axillary nodes. 2. Persistent and increased metabolic activity of left level 3 lymph nodes ( [place] 4). Nodes are partially calcified and similar size. 3. No new metastatic disease. 4. Normal spleen and bone  marrow.   11/09/2018 PET scan   IMPRESSION: 1. Mild response to therapy of hypermetabolic left cervical nodes. ([place]) 4. 2. No new or progressive disease. 3. Coronary artery atherosclerosis. Aortic Atherosclerosis (ICD10-I70.0). 4. Trace cul-de-sac fluid, similar.     REVIEW OF SYSTEMS:   Constitutional: Denies fevers, chills or abnormal weight loss Eyes: Denies blurriness of vision Ears, nose, mouth, throat, and face: Denies mucositis or sore throat Respiratory: Denies cough, dyspnea or wheezes Cardiovascular: Denies palpitation, chest discomfort or lower extremity swelling Gastrointestinal:  Denies nausea, heartburn or change in bowel habits Skin: Denies abnormal skin rashes Lymphatics: Denies new lymphadenopathy or easy bruising Neurological:Denies numbness, tingling or new weaknesses Behavioral/Psych: Mood is stable, no new changes  All other systems were reviewed with the patient and are negative.  I have reviewed the past medical history, past surgical history, social history and family history with the patient and they are unchanged from previous note.  ALLERGIES:  is allergic to aspirin; levaquin [levofloxacin in d5w]; nickel; statins; allopurinol; and ampicillin.  MEDICATIONS:  Current Outpatient Medications  Medication Sig Dispense Refill  . acetaminophen (TYLENOL) 500 MG tablet Take 1,000 mg by mouth daily as needed for moderate pain.    Marland Kitchen acyclovir (ZOVIRAX) 400 MG tablet Take 1 tablet (400 mg total) by mouth daily. 90 tablet 3  . alfuzosin (UROXATRAL) 10 MG 24 hr tablet Take 10 mg by mouth at bedtime.     Marland Kitchen buPROPion (WELLBUTRIN XL) 150 MG 24 hr tablet TAKE 1 TABLET DAILY 90 tablet 3  . Cholecalciferol (VITAMIN D) 2000 units tablet Take 2,000 Units by mouth at bedtime.     Marland Kitchen  finasteride (PROSCAR) 5 MG tablet Take 5 mg by mouth daily.     Marland Kitchen lidocaine-prilocaine (EMLA) cream Apply to affected area once 30 g 3  . metoprolol succinate (TOPROL-XL) 25 MG 24 hr tablet  Take 12.5 mg by mouth daily.     . ondansetron (ZOFRAN) 8 MG tablet Take 1 tablet (8 mg total) by mouth every 8 (eight) hours as needed for refractory nausea / vomiting. (Patient not taking: Reported on 08/20/2018) 30 tablet 1  . Polyethyl Glycol-Propyl Glycol (SYSTANE OP) Place 1 drop into both eyes 2 (two) times daily.    . prochlorperazine (COMPAZINE) 10 MG tablet Take 1 tablet (10 mg total) by mouth every 6 (six) hours as needed (Nausea or vomiting). (Patient not taking: Reported on 08/20/2018) 30 tablet 1   No current facility-administered medications for this visit.     PHYSICAL EXAMINATION: ECOG PERFORMANCE STATUS: 0 - Asymptomatic  Vitals:   11/10/18 0905  BP: 123/61  Pulse: 83  Resp: 18  Temp: 98.3 F (36.8 C)  SpO2: 96%   Filed Weights   11/10/18 0905  Weight: 218 lb (98.9 kg)    GENERAL:alert, no distress and comfortable SKIN: skin color, texture, turgor are normal, no rashes or significant lesions.  He has no palpable lesion on his skin on the chest wall NECK: The abnormal lymphadenopathy seen is nonpalpable and on exam  NEURO: alert & oriented x 3 with fluent speech, no focal motor/sensory deficits  LABORATORY DATA:  I have reviewed the data as listed    Component Value Date/Time   NA 140 11/09/2018 0815   K 4.2 11/09/2018 0815   CL 107 11/09/2018 0815   CO2 23 11/09/2018 0815   GLUCOSE 93 11/09/2018 0815   BUN 16 11/09/2018 0815   CREATININE 1.13 11/09/2018 0815   CREATININE 1.16 10/20/2018 0934   CALCIUM 8.8 (L) 11/09/2018 0815   PROT 6.6 11/09/2018 0815   PROT 6.6 07/23/2017 1504   ALBUMIN 4.0 11/09/2018 0815   AST 18 11/09/2018 0815   AST 20 10/20/2018 0934   ALT 20 11/09/2018 0815   ALT 17 10/20/2018 0934   ALKPHOS 51 11/09/2018 0815   BILITOT 0.7 11/09/2018 0815   BILITOT 1.2 10/20/2018 0934   GFRNONAA >60 11/09/2018 0815   GFRNONAA >60 10/20/2018 0934   GFRAA >60 11/09/2018 0815   GFRAA >60 10/20/2018 0934    No results found for: SPEP,  UPEP  Lab Results  Component Value Date   WBC 3.8 (L) 11/09/2018   NEUTROABS 2.6 11/09/2018   HGB 12.8 (L) 11/09/2018   HCT 38.0 (L) 11/09/2018   MCV 92.5 11/09/2018   PLT 163 11/09/2018      Chemistry      Component Value Date/Time   NA 140 11/09/2018 0815   K 4.2 11/09/2018 0815   CL 107 11/09/2018 0815   CO2 23 11/09/2018 0815   BUN 16 11/09/2018 0815   CREATININE 1.13 11/09/2018 0815   CREATININE 1.16 10/20/2018 0934      Component Value Date/Time   CALCIUM 8.8 (L) 11/09/2018 0815   ALKPHOS 51 11/09/2018 0815   AST 18 11/09/2018 0815   AST 20 10/20/2018 0934   ALT 20 11/09/2018 0815   ALT 17 10/20/2018 0934   BILITOT 0.7 11/09/2018 0815   BILITOT 1.2 10/20/2018 0934       RADIOGRAPHIC STUDIES: I have reviewed multiple imaging studies with the patient extensively I have personally reviewed the radiological images as listed and agreed with the  findings in the report. Nm Pet Image Restag (ps) Skull Base To Thigh  Result Date: 11/09/2018 CLINICAL DATA:  Subsequent treatment strategy for restaging of mantle cell lymphoma. EXAM: NUCLEAR MEDICINE PET SKULL BASE TO THIGH TECHNIQUE: 10.7 mCi F-18 FDG was injected intravenously. Full-ring PET imaging was performed from the skull base to thigh after the radiotracer. CT data was obtained and used for attenuation correction and anatomic localization. Fasting blood glucose: 95 mg/dl COMPARISON:  08/24/2018 FINDINGS: Mediastinal blood pool activity: SUV max 2.4 Liver activity: SUV max 4.2 NECK: Left posterior triangle calcified node measures 7 mm and a S.U.V. max of 3.6 on image 32/4. Compare 8 mm and a S.U.V. max of 3.6 on the prior exam. Left-sided level 3 nodes measure up to 1.6 cm and a S.U.V. max of 6.7 today versus 1.7 cm and a S.U.V. max of 7.7 on the prior. Incidental CT findings: none CHEST: No pulmonary parenchymal or thoracic nodal hypermetabolism. A focus of hypermetabolism about the anterior chest wall, superficial to the  sternum, is without CT correlate and measures a S.U.V. max of 4.2, including on approximately image 96/4. Incidental CT findings: Right Port-A-Cath tip at low SVC. Mild cardiomegaly. Lad coronary and aortic atherosclerosis. Fourth anterior right rib fixation. ABDOMEN/PELVIS: No abdominopelvic parenchymal or nodal hypermetabolism. Incidental CT findings: Embolization coils in the splenic artery distribution. Low-density 1.7 cm left renal lesion is likely a cyst. Left extrarenal pelvis. Normal adrenal glands. Abdominal aortic atherosclerosis. Moderate prostatomegaly. Trace cul-de-sac fluid is similar. Celiac ectasia at up to 1.5 cm is not significantly changed. Fat containing left inguinal hernia. SKELETON: No abnormal marrow activity. Incidental CT findings: none IMPRESSION: 1. Mild response to therapy of hypermetabolic left cervical nodes. ([place]) 4. 2. No new or progressive disease. 3. Coronary artery atherosclerosis. Aortic Atherosclerosis (ICD10-I70.0). 4. Trace cul-de-sac fluid, similar. Electronically Signed   By: Abigail Miyamoto M.D.   On: 11/09/2018 10:36    All questions were answered. The patient knows to call the clinic with any problems, questions or concerns. No barriers to learning was detected.  I spent 25 minutes counseling the patient face to face. The total time spent in the appointment was 30 minutes and more than 50% was on counseling and review of test results  Heath Lark, MD 11/10/2018 10:11 AM

## 2018-11-10 NOTE — Assessment & Plan Note (Signed)
I have reviewed the PET CT scan extensively with the patient I have also discussed this case at the hematology tumor board this morning Due to persistent abnormal activity and lymphadenopathy seen on the left side of his neck, I recommend repeat excisional biopsy through ENT service I will attempt to call the ENT physician today to expedite the work-up I explained to the patient and his wife the rationale of pursuing repeat biopsy to exclude different pathology The mild hypermetabolism seen on his chest wall did not correlate with any other findings and I reassured the patient I will call him back to schedule return appointment as soon as we have results back

## 2018-11-11 ENCOUNTER — Encounter: Payer: Self-pay | Admitting: Hematology and Oncology

## 2018-11-16 DIAGNOSIS — C8311 Mantle cell lymphoma, lymph nodes of head, face, and neck: Secondary | ICD-10-CM | POA: Diagnosis not present

## 2018-11-17 DIAGNOSIS — L821 Other seborrheic keratosis: Secondary | ICD-10-CM | POA: Diagnosis not present

## 2018-11-17 DIAGNOSIS — D2261 Melanocytic nevi of right upper limb, including shoulder: Secondary | ICD-10-CM | POA: Diagnosis not present

## 2018-11-17 DIAGNOSIS — L814 Other melanin hyperpigmentation: Secondary | ICD-10-CM | POA: Diagnosis not present

## 2018-11-17 DIAGNOSIS — L0889 Other specified local infections of the skin and subcutaneous tissue: Secondary | ICD-10-CM | POA: Diagnosis not present

## 2018-11-17 DIAGNOSIS — D1801 Hemangioma of skin and subcutaneous tissue: Secondary | ICD-10-CM | POA: Diagnosis not present

## 2018-11-17 DIAGNOSIS — D2271 Melanocytic nevi of right lower limb, including hip: Secondary | ICD-10-CM | POA: Diagnosis not present

## 2018-11-17 DIAGNOSIS — L239 Allergic contact dermatitis, unspecified cause: Secondary | ICD-10-CM | POA: Diagnosis not present

## 2018-11-17 DIAGNOSIS — D225 Melanocytic nevi of trunk: Secondary | ICD-10-CM | POA: Diagnosis not present

## 2018-11-17 DIAGNOSIS — D2272 Melanocytic nevi of left lower limb, including hip: Secondary | ICD-10-CM | POA: Diagnosis not present

## 2018-11-18 ENCOUNTER — Telehealth: Payer: Self-pay

## 2018-11-18 NOTE — Telephone Encounter (Signed)
Spoke with pt by phone. He says biopsy with [name] has not yet been scheduled. Advised pt to call and let us know as soon as it has been scheduled, and if he has not heard from [name] office by Monday 8/31 to call and let us know that as well.

## 2018-11-19 ENCOUNTER — Other Ambulatory Visit: Payer: Self-pay | Admitting: Otolaryngology

## 2018-11-19 DIAGNOSIS — C8310 Mantle cell lymphoma, unspecified site: Secondary | ICD-10-CM | POA: Diagnosis not present

## 2018-11-19 DIAGNOSIS — Z9221 Personal history of antineoplastic chemotherapy: Secondary | ICD-10-CM | POA: Diagnosis not present

## 2018-11-19 DIAGNOSIS — R590 Localized enlarged lymph nodes: Secondary | ICD-10-CM | POA: Diagnosis not present

## 2018-11-20 ENCOUNTER — Other Ambulatory Visit (HOSPITAL_COMMUNITY)
Admission: RE | Admit: 2018-11-20 | Discharge: 2018-11-20 | Disposition: A | Discharge status: Home / Self Care | Payer: Medicare Other | Source: Ambulatory Visit | Attending: Otolaryngology | Admitting: Otolaryngology

## 2018-11-20 ENCOUNTER — Telehealth: Payer: Self-pay

## 2018-11-20 ENCOUNTER — Other Ambulatory Visit: Payer: Self-pay

## 2018-11-20 ENCOUNTER — Encounter: Payer: Self-pay | Admitting: Hematology and Oncology

## 2018-11-20 ENCOUNTER — Encounter (HOSPITAL_COMMUNITY): Payer: Self-pay | Admitting: *Deleted

## 2018-11-20 ENCOUNTER — Encounter (INDEPENDENT_AMBULATORY_CARE_PROVIDER_SITE_OTHER): Payer: Medicare Other | Admitting: Ophthalmology

## 2018-11-20 DIAGNOSIS — Z20828 Contact with and (suspected) exposure to other viral communicable diseases: Secondary | ICD-10-CM | POA: Diagnosis not present

## 2018-11-20 DIAGNOSIS — H43813 Vitreous degeneration, bilateral: Secondary | ICD-10-CM | POA: Diagnosis not present

## 2018-11-20 DIAGNOSIS — Z01812 Encounter for preprocedural laboratory examination: Secondary | ICD-10-CM | POA: Insufficient documentation

## 2018-11-20 DIAGNOSIS — H33303 Unspecified retinal break, bilateral: Secondary | ICD-10-CM

## 2018-11-20 LAB — SARS CORONAVIRUS 2 (TAT 6-24 HRS): SARS Coronavirus 2: NEGATIVE

## 2018-11-20 NOTE — Telephone Encounter (Signed)
Wife called and left a message. Sean Stanley is scheduled for left neck lymph node removal and biopsy by [name] on Monday, August 31 at 11 am at [facility]. He has a return appointment with [name] on Tuesday, Sept. 7 at 11 am.

## 2018-11-23 ENCOUNTER — Encounter (HOSPITAL_COMMUNITY)
Admission: RE | Disposition: A | Discharge status: Home / Self Care | Payer: Self-pay | Source: Home / Self Care | Attending: Otolaryngology

## 2018-11-23 ENCOUNTER — Encounter (HOSPITAL_COMMUNITY): Payer: Self-pay | Admitting: *Deleted

## 2018-11-23 ENCOUNTER — Telehealth: Payer: Self-pay | Admitting: Hematology and Oncology

## 2018-11-23 ENCOUNTER — Other Ambulatory Visit: Payer: Self-pay

## 2018-11-23 ENCOUNTER — Ambulatory Visit (HOSPITAL_COMMUNITY): Payer: Medicare Other | Admitting: Certified Registered Nurse Anesthetist

## 2018-11-23 ENCOUNTER — Observation Stay (HOSPITAL_COMMUNITY)
Admission: RE | Admit: 2018-11-23 | Discharge: 2018-11-24 | Disposition: A | Discharge status: Home / Self Care | Payer: Medicare Other | Attending: Otolaryngology | Admitting: Otolaryngology

## 2018-11-23 DIAGNOSIS — Z961 Presence of intraocular lens: Secondary | ICD-10-CM | POA: Insufficient documentation

## 2018-11-23 DIAGNOSIS — D649 Anemia, unspecified: Secondary | ICD-10-CM | POA: Insufficient documentation

## 2018-11-23 DIAGNOSIS — E785 Hyperlipidemia, unspecified: Secondary | ICD-10-CM | POA: Diagnosis not present

## 2018-11-23 DIAGNOSIS — I10 Essential (primary) hypertension: Secondary | ICD-10-CM | POA: Diagnosis not present

## 2018-11-23 DIAGNOSIS — C73 Malignant neoplasm of thyroid gland: Secondary | ICD-10-CM | POA: Diagnosis not present

## 2018-11-23 DIAGNOSIS — Z888 Allergy status to other drugs, medicaments and biological substances status: Secondary | ICD-10-CM | POA: Diagnosis not present

## 2018-11-23 DIAGNOSIS — T65891S Toxic effect of other specified substances, accidental (unintentional), sequela: Secondary | ICD-10-CM | POA: Insufficient documentation

## 2018-11-23 DIAGNOSIS — I739 Peripheral vascular disease, unspecified: Secondary | ICD-10-CM | POA: Diagnosis not present

## 2018-11-23 DIAGNOSIS — Z79899 Other long term (current) drug therapy: Secondary | ICD-10-CM | POA: Diagnosis not present

## 2018-11-23 DIAGNOSIS — I059 Rheumatic mitral valve disease, unspecified: Secondary | ICD-10-CM | POA: Diagnosis not present

## 2018-11-23 DIAGNOSIS — C8311 Mantle cell lymphoma, lymph nodes of head, face, and neck: Secondary | ICD-10-CM | POA: Diagnosis not present

## 2018-11-23 DIAGNOSIS — Z886 Allergy status to analgesic agent status: Secondary | ICD-10-CM | POA: Diagnosis not present

## 2018-11-23 DIAGNOSIS — Z9841 Cataract extraction status, right eye: Secondary | ICD-10-CM | POA: Insufficient documentation

## 2018-11-23 DIAGNOSIS — G622 Polyneuropathy due to other toxic agents: Secondary | ICD-10-CM | POA: Insufficient documentation

## 2018-11-23 DIAGNOSIS — N400 Enlarged prostate without lower urinary tract symptoms: Secondary | ICD-10-CM | POA: Diagnosis not present

## 2018-11-23 DIAGNOSIS — R9431 Abnormal electrocardiogram [ECG] [EKG]: Secondary | ICD-10-CM | POA: Insufficient documentation

## 2018-11-23 DIAGNOSIS — Z9842 Cataract extraction status, left eye: Secondary | ICD-10-CM | POA: Insufficient documentation

## 2018-11-23 DIAGNOSIS — I4892 Unspecified atrial flutter: Secondary | ICD-10-CM | POA: Diagnosis not present

## 2018-11-23 DIAGNOSIS — Z85820 Personal history of malignant melanoma of skin: Secondary | ICD-10-CM | POA: Diagnosis not present

## 2018-11-23 DIAGNOSIS — Z880 Allergy status to penicillin: Secondary | ICD-10-CM | POA: Insufficient documentation

## 2018-11-23 DIAGNOSIS — E7800 Pure hypercholesterolemia, unspecified: Secondary | ICD-10-CM | POA: Diagnosis not present

## 2018-11-23 DIAGNOSIS — C8318 Mantle cell lymphoma, lymph nodes of multiple sites: Secondary | ICD-10-CM | POA: Diagnosis present

## 2018-11-23 DIAGNOSIS — Z8679 Personal history of other diseases of the circulatory system: Secondary | ICD-10-CM | POA: Diagnosis not present

## 2018-11-23 DIAGNOSIS — G4733 Obstructive sleep apnea (adult) (pediatric): Secondary | ICD-10-CM | POA: Insufficient documentation

## 2018-11-23 DIAGNOSIS — I4891 Unspecified atrial fibrillation: Secondary | ICD-10-CM | POA: Diagnosis not present

## 2018-11-23 DIAGNOSIS — I499 Cardiac arrhythmia, unspecified: Secondary | ICD-10-CM | POA: Diagnosis not present

## 2018-11-23 DIAGNOSIS — Z881 Allergy status to other antibiotic agents status: Secondary | ICD-10-CM | POA: Insufficient documentation

## 2018-11-23 DIAGNOSIS — C8590 Non-Hodgkin lymphoma, unspecified, unspecified site: Secondary | ICD-10-CM | POA: Diagnosis present

## 2018-11-23 HISTORY — PX: LYMPH NODE BIOPSY: SHX201

## 2018-11-23 HISTORY — DX: Personal history of other medical treatment: Z92.89

## 2018-11-23 SURGERY — LYMPH NODE BIOPSY
Anesthesia: General | Site: Neck | Laterality: Left

## 2018-11-23 MED ORDER — ACYCLOVIR 400 MG PO TABS
400.0000 mg | ORAL_TABLET | Freq: Every day | ORAL | Status: DC
  Administered 2018-11-23 – 2018-11-24 (×2): 400 mg via ORAL
  Filled 2018-11-23 (×2): qty 1

## 2018-11-23 MED ORDER — HYDROCODONE-ACETAMINOPHEN 5-325 MG PO TABS
1.0000 | ORAL_TABLET | ORAL | Status: DC | PRN
  Administered 2018-11-23: 2 via ORAL
  Filled 2018-11-23: qty 2

## 2018-11-23 MED ORDER — METOPROLOL SUCCINATE ER 25 MG PO TB24
12.5000 mg | ORAL_TABLET | Freq: Every day | ORAL | Status: DC
  Administered 2018-11-24: 12.5 mg via ORAL
  Filled 2018-11-23: qty 1

## 2018-11-23 MED ORDER — LIDOCAINE-EPINEPHRINE 1 %-1:100000 IJ SOLN
INTRAMUSCULAR | Status: AC
  Filled 2018-11-23: qty 1

## 2018-11-23 MED ORDER — POLYVINYL ALCOHOL 1.4 % OP SOLN: 1.0000 [drp] | OPHTHALMIC | Status: DC | PRN

## 2018-11-23 MED ORDER — POLYETHYL GLYC-PROPYL GLYC PF 0.4-0.3 % OP SOLN: 1.0000 [drp] | Freq: Two times a day (BID) | OPHTHALMIC | Status: DC | PRN

## 2018-11-23 MED ORDER — LACTATED RINGERS IV SOLN
INTRAVENOUS | Status: DC | PRN
  Administered 2018-11-23: 09:00:00 via INTRAVENOUS

## 2018-11-23 MED ORDER — LIDOCAINE 2% (20 MG/ML) 5 ML SYRINGE
INTRAMUSCULAR | Status: AC
  Filled 2018-11-23: qty 5

## 2018-11-23 MED ORDER — KCL IN DEXTROSE-NACL 20-5-0.45 MEQ/L-%-% IV SOLN
INTRAVENOUS | Status: DC
  Administered 2018-11-23 – 2018-11-24 (×2): via INTRAVENOUS
  Filled 2018-11-23 (×2): qty 1000

## 2018-11-23 MED ORDER — ROCURONIUM BROMIDE 10 MG/ML (PF) SYRINGE
PREFILLED_SYRINGE | INTRAVENOUS | Status: AC
  Filled 2018-11-23: qty 10

## 2018-11-23 MED ORDER — BUPROPION HCL ER (XL) 150 MG PO TB24
150.0000 mg | ORAL_TABLET | Freq: Every day | ORAL | Status: DC
  Administered 2018-11-24: 150 mg via ORAL
  Filled 2018-11-23: qty 1

## 2018-11-23 MED ORDER — ONDANSETRON HCL 4 MG/2ML IJ SOLN
INTRAMUSCULAR | Status: AC
  Filled 2018-11-23: qty 2

## 2018-11-23 MED ORDER — DEXAMETHASONE SODIUM PHOSPHATE 10 MG/ML IJ SOLN
INTRAMUSCULAR | Status: DC | PRN
  Administered 2018-11-23: 10 mg via INTRAVENOUS

## 2018-11-23 MED ORDER — CHLORHEXIDINE GLUCONATE CLOTH 2 % EX PADS: 6.0000 | MEDICATED_PAD | Freq: Once | CUTANEOUS | Status: DC

## 2018-11-23 MED ORDER — ROCURONIUM BROMIDE 10 MG/ML (PF) SYRINGE
PREFILLED_SYRINGE | INTRAVENOUS | Status: DC | PRN
  Administered 2018-11-23: 50 mg via INTRAVENOUS

## 2018-11-23 MED ORDER — FENTANYL CITRATE (PF) 250 MCG/5ML IJ SOLN
INTRAMUSCULAR | Status: AC
  Filled 2018-11-23: qty 5

## 2018-11-23 MED ORDER — 0.9 % SODIUM CHLORIDE (POUR BTL) OPTIME
TOPICAL | Status: DC | PRN
  Administered 2018-11-23: 12:00:00 1000 mL

## 2018-11-23 MED ORDER — LACTATED RINGERS IV SOLN
Freq: Once | INTRAVENOUS | Status: AC
  Administered 2018-11-23: 09:00:00 via INTRAVENOUS

## 2018-11-23 MED ORDER — ALFUZOSIN HCL ER 10 MG PO TB24
10.0000 mg | ORAL_TABLET | Freq: Every day | ORAL | Status: DC
  Administered 2018-11-23: 10 mg via ORAL
  Filled 2018-11-23: qty 1

## 2018-11-23 MED ORDER — PROPOFOL 10 MG/ML IV BOLUS
INTRAVENOUS | Status: DC | PRN
  Administered 2018-11-23: 150 mg via INTRAVENOUS

## 2018-11-23 MED ORDER — FINASTERIDE 5 MG PO TABS
5.0000 mg | ORAL_TABLET | Freq: Every day | ORAL | Status: DC
  Administered 2018-11-23: 22:00:00 5 mg via ORAL
  Filled 2018-11-23: qty 1

## 2018-11-23 MED ORDER — FENTANYL CITRATE (PF) 100 MCG/2ML IJ SOLN
25.0000 ug | INTRAMUSCULAR | Status: DC | PRN
  Administered 2018-11-23: 25 ug via INTRAVENOUS
  Administered 2018-11-23: 50 ug via INTRAVENOUS

## 2018-11-23 MED ORDER — LIDOCAINE 2% (20 MG/ML) 5 ML SYRINGE
INTRAMUSCULAR | Status: DC | PRN
  Administered 2018-11-23: 100 mg via INTRAVENOUS

## 2018-11-23 MED ORDER — PROPOFOL 10 MG/ML IV BOLUS
INTRAVENOUS | Status: AC
  Filled 2018-11-23: qty 20

## 2018-11-23 MED ORDER — PHENYLEPHRINE 40 MCG/ML (10ML) SYRINGE FOR IV PUSH (FOR BLOOD PRESSURE SUPPORT)
PREFILLED_SYRINGE | INTRAVENOUS | Status: DC | PRN
  Administered 2018-11-23: 80 ug via INTRAVENOUS

## 2018-11-23 MED ORDER — SUGAMMADEX SODIUM 200 MG/2ML IV SOLN
INTRAVENOUS | Status: DC | PRN
  Administered 2018-11-23: 198 mg via INTRAVENOUS

## 2018-11-23 MED ORDER — VITAMIN D 25 MCG (1000 UNIT) PO TABS
2000.0000 [IU] | ORAL_TABLET | Freq: Every day | ORAL | Status: DC
  Administered 2018-11-23: 2000 [IU] via ORAL
  Filled 2018-11-23 (×2): qty 2

## 2018-11-23 MED ORDER — MORPHINE SULFATE (PF) 2 MG/ML IV SOLN: 2.0000 mg | INTRAVENOUS | Status: DC | PRN

## 2018-11-23 MED ORDER — BACITRACIN ZINC 500 UNIT/GM EX OINT
TOPICAL_OINTMENT | CUTANEOUS | Status: AC
  Filled 2018-11-23: qty 28.35

## 2018-11-23 MED ORDER — FENTANYL CITRATE (PF) 100 MCG/2ML IJ SOLN
INTRAMUSCULAR | Status: AC
  Filled 2018-11-23: qty 2

## 2018-11-23 MED ORDER — ONDANSETRON HCL 4 MG/2ML IJ SOLN: 4.0000 mg | Freq: Once | INTRAMUSCULAR | Status: DC | PRN

## 2018-11-23 MED ORDER — ONDANSETRON HCL 4 MG/2ML IJ SOLN
INTRAMUSCULAR | Status: DC | PRN
  Administered 2018-11-23: 4 mg via INTRAVENOUS

## 2018-11-23 MED ORDER — FENTANYL CITRATE (PF) 250 MCG/5ML IJ SOLN
INTRAMUSCULAR | Status: DC | PRN
  Administered 2018-11-23 (×2): 50 ug via INTRAVENOUS

## 2018-11-23 MED ORDER — LIDOCAINE-EPINEPHRINE 1 %-1:100000 IJ SOLN
INTRAMUSCULAR | Status: DC | PRN
  Administered 2018-11-23: 5 mL

## 2018-11-23 MED ORDER — MEPERIDINE HCL 25 MG/ML IJ SOLN: 6.2500 mg | INTRAMUSCULAR | Status: DC | PRN

## 2018-11-23 MED ORDER — CEFAZOLIN SODIUM-DEXTROSE 2-4 GM/100ML-% IV SOLN
2.0000 g | INTRAVENOUS | Status: AC
  Administered 2018-11-23: 2 g via INTRAVENOUS
  Filled 2018-11-23: qty 100

## 2018-11-23 MED ORDER — ACETAMINOPHEN 500 MG PO TABS
1000.0000 mg | ORAL_TABLET | Freq: Once | ORAL | Status: AC
  Administered 2018-11-23: 1000 mg via ORAL
  Filled 2018-11-23: qty 2

## 2018-11-23 SURGICAL SUPPLY — 44 items
ADH SKN CLS LQ APL DERMABOND (GAUZE/BANDAGES/DRESSINGS) ×1
BLADE SURG 15 STRL LF DISP TIS (BLADE) IMPLANT
BLADE SURG 15 STRL SS (BLADE)
CANISTER SUCT 3000ML PPV (MISCELLANEOUS) IMPLANT
CLEANER TIP ELECTROSURG 2X2 (MISCELLANEOUS) ×2 IMPLANT
CONT SPEC 4OZ CLIKSEAL STRL BL (MISCELLANEOUS) ×2 IMPLANT
COVER SURGICAL LIGHT HANDLE (MISCELLANEOUS) ×2 IMPLANT
COVER WAND RF STERILE (DRAPES) ×2 IMPLANT
DERMABOND ADHESIVE PROPEN (GAUZE/BANDAGES/DRESSINGS) ×1
DERMABOND ADVANCED .7 DNX6 (GAUZE/BANDAGES/DRESSINGS) IMPLANT
DRAIN HEMOVAC 7FR (DRAIN) ×1 IMPLANT
DRAIN PENROSE 1/4X12 LTX STRL (WOUND CARE) IMPLANT
DRAPE HALF SHEET 40X57 (DRAPES) IMPLANT
DRSG EMULSION OIL 3X3 NADH (GAUZE/BANDAGES/DRESSINGS) IMPLANT
ELECT COATED BLADE 2.86 ST (ELECTRODE) ×2 IMPLANT
ELECT NDL TIP 2.8 STRL (NEEDLE) IMPLANT
ELECT NEEDLE TIP 2.8 STRL (NEEDLE) IMPLANT
ELECT REM PT RETURN 9FT ADLT (ELECTROSURGICAL) ×2
ELECTRODE REM PT RTRN 9FT ADLT (ELECTROSURGICAL) ×1 IMPLANT
GAUZE SPONGE 4X4 12PLY STRL (GAUZE/BANDAGES/DRESSINGS) IMPLANT
GLOVE BIO SURGEON STRL SZ7.5 (GLOVE) ×2 IMPLANT
GOWN STRL REUS W/ TWL LRG LVL3 (GOWN DISPOSABLE) ×2 IMPLANT
GOWN STRL REUS W/TWL LRG LVL3 (GOWN DISPOSABLE) ×4
KIT BASIN OR (CUSTOM PROCEDURE TRAY) ×2 IMPLANT
KIT TURNOVER KIT B (KITS) IMPLANT
NDL HYPO 25GX1X1/2 BEV (NEEDLE) IMPLANT
NEEDLE HYPO 25GX1X1/2 BEV (NEEDLE) IMPLANT
NS IRRIG 1000ML POUR BTL (IV SOLUTION) ×2 IMPLANT
PAD ARMBOARD 7.5X6 YLW CONV (MISCELLANEOUS) ×4 IMPLANT
PENCIL BUTTON HOLSTER BLD 10FT (ELECTRODE) ×2 IMPLANT
POSITIONER HEAD DONUT 9IN (MISCELLANEOUS) IMPLANT
SUT CHROMIC 4 0 P 3 18 (SUTURE) IMPLANT
SUT ETHILON 4 0 PS 2 18 (SUTURE) IMPLANT
SUT ETHILON 5 0 P 3 18 (SUTURE)
SUT NYLON ETHILON 5-0 P-3 1X18 (SUTURE) IMPLANT
SUT SILK 3 0 (SUTURE) ×2
SUT SILK 3-0 18XBRD TIE 12 (SUTURE) IMPLANT
SUT SILK 4 0 (SUTURE)
SUT SILK 4-0 18XBRD TIE 12 (SUTURE) IMPLANT
SWAB COLLECTION DEVICE MRSA (MISCELLANEOUS) IMPLANT
SWAB CULTURE ESWAB REG 1ML (MISCELLANEOUS) IMPLANT
SYR BULB IRRIGATION 50ML (SYRINGE) IMPLANT
SYR TB 1ML LUER SLIP (SYRINGE) IMPLANT
TRAY ENT MC OR (CUSTOM PROCEDURE TRAY) ×2 IMPLANT

## 2018-11-23 NOTE — Anesthesia Postprocedure Evaluation (Signed)
Anesthesia Post Note  Patient: Sean Stanley  Procedure(s) Performed: LEFT CERVICAL LYMPH NODE OPEN BIOPSY (Left Neck)     Patient location during evaluation: PACU Anesthesia Type: General Level of consciousness: sedated and patient cooperative Pain management: pain level controlled Vital Signs Assessment: post-procedure vital signs reviewed and stable Respiratory status: spontaneous breathing Cardiovascular status: stable Anesthetic complications: no    Last Vitals:  Vitals:   11/23/18 1249 11/23/18 1313  BP: (!) 155/94 (!) 159/99  Pulse: 65 64  Resp: 18 18  Temp: 36.6 C 36.4 C  SpO2: 97% 100%    Last Pain:  Vitals:   11/23/18 1313  TempSrc: Oral  PainSc: [place]

## 2018-11-23 NOTE — Plan of Care (Signed)
  Problem: Education: Goal: Knowledge of General Education information will improve Description: Including pain rating scale, medication(s)/side effects and non-pharmacologic comfort measures Outcome: Progressing   Problem: Activity: Goal: Risk for activity intolerance will decrease Outcome: Progressing   Problem: Elimination: Goal: Will not experience complications related to urinary retention Outcome: Progressing   Problem: Safety: Goal: Ability to remain free from injury will improve Outcome: Progressing   Problem: Skin Integrity: Goal: Risk for impaired skin integrity will decrease Outcome: Progressing   

## 2018-11-23 NOTE — Telephone Encounter (Signed)
Spoke with spouse and gave below msg.  Appt has been made for Friday.  She is aware of date, time

## 2018-11-23 NOTE — Progress Notes (Signed)
Pt arrived to 6N17 via stretcher after surgery. Received report from [place], [place] in PACU. See assessment. Will continue to monitor.

## 2018-11-23 NOTE — Anesthesia Procedure Notes (Signed)

## 2018-11-23 NOTE — Anesthesia Preprocedure Evaluation (Addendum)
Anesthesia Evaluation  Patient identified by MRN, date of birth, ID band  Reviewed: Allergy & Precautions, NPO status , Patient's Chart, lab work & pertinent test results  Airway Mallampati: II  TM Distance: >3 FB Neck ROM: Full    Dental  (+) Dental Advisory Given   Pulmonary sleep apnea ,    Pulmonary exam normal breath sounds clear to auscultation       Cardiovascular + Peripheral Vascular Disease  Normal cardiovascular exam+ dysrhythmias Atrial Fibrillation  Rhythm:Regular Rate:Normal     Neuro/Psych  Headaches,    GI/Hepatic negative GI ROS, Neg liver ROS,   Endo/Other  negative endocrine ROS  Renal/GU Renal disease     Musculoskeletal negative musculoskeletal ROS (+)   Abdominal   Peds  Hematology  (+) anemia , HLD   Anesthesia Other Findings 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 (+) Dental Advisory Given   Pulmonary    Pulmonary exam normal        Cardiovascular hypertension, Pt. on home beta blockers and Pt. on medications + Peripheral Vascular Disease  Normal cardiovascular exam  Cardiac MRI stress morphology and function 11/24/13 ([place]): 1. The left ventricle is upper limit of normal in end-diastolic volume, and mild enlarged in end-systolic volume, with normal wall thickness. Global systolic function is mildly reduced. The LV ejection fraction is 51%. There are no regional wall  motion abnormalities.             LV volumes absolute and indexed to BSA with age- and      gender matched normal values in parenthesis (mean, 95%    CI)  are as follows:             EDV 199 ml (146, 105-187)             ESV 98 ml (47, 25-70)             EDVI 92 ml/m2 (75, 58-93)             ESVI 45 ml/m2 (24, 13-35) 2. The right ventricle is normal in volumes, and wall thickness. Global systolic function is normal, the quantitative RVEF is 57%. There are no regional wall motion abnormalities.             RV volumes absolute and indexed to BSA with age- and     gender matched normal values in parenthesis (mean, 95%    CI) are as follows:             EDV 180 ml (150, 100-200)             ESV 78 ml (46, 16-76)             EDVI 83 ml/m2 (75, 52-98)  ESVI 36 ml/m2 (23, 8-37) 3. Both atria are mild-moderately dilated. 4. The aortic valve is trileaflet, there is no significant aortic valve stenosis or regurgitation. There is mild mitral regurgitation (s/p mitral ring), and mild tricuspid regurgitation, no other significant valvular disease seen.  5. Delayed enhancement imaging demonstrates no evidence of myocardial infarction, scar or infiltrative disease.  6. Regadenoson stress perfusion imaging demonstrates no evidence of inducible myocardial ischemia.      Neuro/Psych negative neurological ROS  negative psych ROS   GI/Hepatic negative GI ROS, Neg liver ROS,   Endo/Other  negative endocrine ROS  Renal/GU negative Renal ROS  negative genitourinary   Musculoskeletal negative musculoskeletal ROS (+)   Abdominal   Peds negative pediatric ROS (+)  Hematology negative hematology ROS (+)   Anesthesia Other Findings   Reproductive/Obstetrics negative OB ROS                            Anesthesia Physical Anesthesia Plan  ASA: III  Anesthesia Plan: General   Post-op Pain Management:    Induction: Intravenous  PONV Risk Score and Plan: 3 and  Ondansetron, Dexamethasone and Scopolamine patch - Pre-op  Airway Management Planned: LMA  Additional Equipment:   Intra-op Plan:   Post-operative Plan: Extubation in OR  Informed Consent: I have reviewed the patients History and Physical, chart, labs and discussed the procedure including the risks, benefits and alternatives for the proposed anesthesia with the patient or authorized representative who has indicated his/her understanding and acceptance.     Dental advisory given  Plan Discussed with: CRNA and Anesthesiologist  Anesthesia Plan Comments: (PAT note written 04/24/2018 by Myra Gianotti, PA-C. )      Anesthesia Quick Evaluation  Anesthesia Physical Anesthesia Plan  ASA: III  Anesthesia Plan: General   Post-op Pain Management:    Induction: Intravenous  PONV Risk Score and Plan: 2 and Ondansetron, Dexamethasone and Treatment may vary due to age or medical condition  Airway Management Planned: Oral ETT  Additional Equipment: None  Intra-op Plan:   Post-operative Plan: Extubation in OR  Informed Consent: I have reviewed the patients History and Physical, chart, labs and discussed the procedure including the risks, benefits and alternatives for the proposed anesthesia with the patient or authorized representative who has indicated his/her understanding and acceptance.     Dental advisory given  Plan Discussed with: CRNA  Anesthesia Plan Comments:        Anesthesia Quick Evaluation

## 2018-11-23 NOTE — Telephone Encounter (Signed)
Spoke with spouse re 9/4 appointment.

## 2018-11-23 NOTE — Telephone Encounter (Signed)
I will send scheduling msg to see him Friday but will see him sooner if results are available sooner

## 2018-11-23 NOTE — Brief Op Note (Signed)
11/23/2018  12:00 PM  PATIENT:  Sean Stanley  78 y.o. 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, face, and neck  PROCEDURE:  Procedure(s): LEFT CERVICAL LYMPH NODE OPEN BIOPSY (Left)  SURGEON:  Surgeon(s) and Role:    Melida Quitter, MD - Primary  PHYSICIAN ASSISTANT:   ASSISTANTS: none   ANESTHESIA:   general  EBL:  25 mL   BLOOD ADMINISTERED:none  DRAINS: (7 Fr) Jackson-Pratt drain(s) with closed bulb suction in the left neck   LOCAL MEDICATIONS USED:  LIDOCAINE   SPECIMEN:  Source of Specimen:  Left zone 3 nodes  DISPOSITION OF SPECIMEN:  PATHOLOGY  COUNTS:  YES  TOURNIQUET:  * No tourniquets in log *  DICTATION: .Other Dictation: Dictation Number [id number]  PLAN OF CARE: Admit for overnight observation  PATIENT DISPOSITION:  PACU - hemodynamically stable.   Delay start of Pharmacological VTE agent (>24hrs) due to surgical blood loss or risk of bleeding: no

## 2018-11-23 NOTE — H&P (Signed)
Sean Stanley is an 78 y.o. male.   Chief Complaint: Lymphoma HPI: 78 year old male treated for mantle cell lymphoma has continued enlarged node in left neck that are hypermetabolic on PET scan.  He presents for repeat excisional biopsy.  Past Medical History:  Diagnosis Date  . 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 Atrial flutter ([place])   . BPH (benign prostatic hyperplasia)   . Cancer ([place])    melanoma on lower right back and left chest - surgically removed and cleared  . Dysrhythmia   . 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 Headache    migraine- not current  . 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 History of blood transfusion   . Hypercholesteremia   . Hypercholesterolemia   . Lymphoma ([place]) 04/2018  . Mitral valve disease    annuloplasty 2002 [facility]  . Neuropathy   . Neuropathy of both feet    pt states due to exposure to [employer]  . OSA (obstructive sleep apnea)    does not use cpap, Dr. Maxwell Caul told him he had improved  . Pneumonia   . Thoracic ascending aortic aneurysm ([facility])    4.5 cm 03/2018 CT    Past Surgical History:  Procedure Laterality Date  . ABDOMINAL ANGIOGRAM  08/05/12  . aneurysm repair    . CARDIOVERSION  02/12/2012   Procedure: CARDIOVERSION;  Surgeon: Pixie Casino, MD;  Location: [facility] ENDOSCOPY;  Service: Cardiovascular;  Laterality: N/A;  . CARDIOVERSION N/A 06/22/2013   Procedure: CARDIOVERSION;  Surgeon: [name], MD;  Location: [place];  Service: Cardiovascular;  Laterality: N/A;  . CATARACT EXTRACTION Bilateral 2018   with lens  implant  . CHOLECYSTECTOMY    . COLONOSCOPY    . ESOPHAGOGASTRODUODENOSCOPY    . IR IMAGING GUIDED PORT INSERTION  05/26/2018  . LYMPH NODE BIOPSY Left 04/27/2018   Procedure: EXCISIONAL BIOPSY OF LEFT CERVICAL LYMPH NODE;  Surgeon: Melida Quitter, MD;  Location: [place];  Service: ENT;  Laterality: Left;  . MENISCUS REPAIR Right 2009  . MITRAL VALVE REPAIR  2002   [facility]  . NM MYOVIEW LTD  07/22/2006   no ischemia  . RIGHT HEART CATH  06/19/2004   normal right heart dynamics. EF 50%  . TEE WITHOUT CARDIOVERSION  02/12/2012   Procedure: TRANSESOPHAGEAL ECHOCARDIOGRAM (TEE);  Surgeon: Pixie Casino, MD;  Location: [facility] ENDOSCOPY;  Service: Cardiovascular;  Laterality: N/A;  . TEE WITHOUT CARDIOVERSION N/A 06/22/2013   Procedure: TRANSESOPHAGEAL ECHOCARDIOGRAM (TEE);  Surgeon: [name], MD;  Location: [facility] ENDOSCOPY;  Service: Cardiovascular;  Laterality: N/A;    Family History  Problem Relation Age of Onset  . Parkinson's disease Mother 18  . Heart failure Father 83  . Cancer Maternal Grandmother   . Heart attack Maternal Grandfather   . Heart attack Paternal Grandmother   . Heart attack Paternal Grandfather    Social History:  reports that he has never smoked. He has never used smokeless tobacco. He reports previous alcohol use. He reports that  he does not use drugs.  Allergies:  Allergies  Allergen Reactions  . Aspirin     Has been instructed not to take any blood thinners even aspirin due to history of several aneurysms   . Levaquin [Levofloxacin In D5w]     tendonitis  . Nickel Itching  . Other     No blood thinners due to history of several aneurysms   . Statins Other (See Comments)    Muscle aches  . Allopurinol Rash  . Ampicillin Rash    Did it involve swelling of the face/tongue/throat, SOB, or low BP? No Did it involve sudden or severe rash/hives, skin peeling, or any reaction on the inside of your mouth or nose? Yes Did you need to seek medical attention at a  hospital or doctor's office? Yes When did it last happen?50+ years If all above answers are "NO", may proceed with cephalosporin use.     Medications Prior to Admission  Medication Sig Dispense Refill  . acetaminophen (TYLENOL) 500 MG tablet Take 1,000 mg by mouth daily as needed for moderate pain.    Marland Kitchen acyclovir (ZOVIRAX) 400 MG tablet Take 1 tablet (400 mg total) by mouth daily. (Patient taking differently: Take 400 mg by mouth daily at 12 noon. ) 90 tablet 3  . alfuzosin (UROXATRAL) 10 MG 24 hr tablet Take 10 mg by mouth at bedtime.     Marland Kitchen buPROPion (WELLBUTRIN XL) 150 MG 24 hr tablet TAKE 1 TABLET DAILY (Patient taking differently: Take 150 mg by mouth daily. ) 90 tablet 3  . Cholecalciferol (VITAMIN D) 2000 units tablet Take 2,000 Units by mouth at bedtime.     . finasteride (PROSCAR) 5 MG tablet Take 5 mg by mouth at bedtime.     . lidocaine-prilocaine (EMLA) cream Apply to affected area once (Patient taking differently: Apply 1 application topically daily as needed (port access). ) 30 g 3  . metoprolol succinate (TOPROL-XL) 25 MG 24 hr tablet Take 12.5 mg by mouth daily.     Vladimir Faster Glycol-Propyl Glycol (SYSTANE HYDRATION PF OP) Place 1 drop into both eyes 2 (two) times daily as needed (dry eyes).      No results found for this or any previous visit (from the past 48 hour(s)). No results found.  Review of Systems  All other systems reviewed and are negative.   Blood pressure 123/85, pulse 77, temperature 97.8 F (36.6 C), temperature source Oral, resp. rate 18, height 6\' 1"  (1.854 m), weight 99 kg, SpO2 99 %. Physical Exam  Constitutional: He is oriented to person, place, and time. He appears well-developed and well-nourished. No distress.  HENT:  Head: Normocephalic and atraumatic.  Right Ear: External ear normal.  Left Ear: External ear normal.  Nose: Nose normal.  Mouth/Throat: Oropharynx is clear and moist.  Eyes: Pupils are equal, round, and reactive to light.  Conjunctivae and EOM are normal.  Neck:  Left mid neck with some fullness.  Cardiovascular: Normal rate.  Respiratory: Effort normal.  Neurological: He is alert and oriented to person, place, and time. No cranial nerve deficit.  Skin: Skin is warm and dry.  Psychiatric: He has a normal mood and affect. His behavior is normal. Judgment and thought content normal.     Assessment/Plan Mantle cell lymphoma  To OR for excisional biopsy of left neck enlarged node.  Melida Quitter, MD 11/23/2018, 10:39 AM

## 2018-11-23 NOTE — Transfer of Care (Signed)
Immediate Anesthesia Transfer of Care Note  Patient: Azavier A Mullan  Procedure(s) Performed: LEFT CERVICAL LYMPH NODE OPEN BIOPSY (Left Neck)  Patient Location: PACU  Anesthesia Type:General  Level of Consciousness: awake, alert  and oriented  Airway & Oxygen Therapy: Patient Spontanous Breathing  Post-op Assessment: Report given to RN, Post -op Vital signs reviewed and stable and Patient moving all extremities X 4  Post vital signs: Reviewed and stable  Last Vitals:  Vitals Value Taken Time  BP    Temp    Pulse 69 11/23/18 1204  Resp 13 11/23/18 1204  SpO2 95 % 11/23/18 1204  Vitals shown include unvalidated device data.  Last Pain:  Vitals:   11/23/18 0848  TempSrc:   PainSc: 0-No pain      Patients Stated Pain Goal: 3 ([id number])  Complications: No apparent anesthesia complications

## 2018-11-23 NOTE — Op Note (Signed)
NAME: Sean Stanley, Sean Stanley MEDICAL RECORD [id number] ACCOUNT [id number] DATE OF BIRTH:September 15, 1940 FACILITY: [facility] LOCATION: [facility] PHYSICIAN:Shila Kruczek Guido Sander, MD  OPERATIVE REPORT  DATE OF PROCEDURE:  11/23/2018  PREOPERATIVE DIAGNOSIS:  Mantle cell lymphoma and cervical lymphadenopathy.  POSTOPERATIVE DIAGNOSIS:  Mantle cell lymphoma and cervical lymphadenopathy.  PROCEDURE:  Excisional biopsy of left cervical lymph nodes.  SURGEON:  Melida Quitter, MD  ANESTHESIA:  General endotracheal anesthesia.  COMPLICATIONS:  None.  INDICATIONS:  The patient is a 78 year old male who was recently treated for mantle cell lymphoma following an open biopsy of a cervical lymph node.  He has persistence of some nodes in the left neck that are still hypermetabolic on PET scan following  therapy, so he presents to the operating room for repeat excisional biopsy.  FINDINGS:  There was a cluster of enlarged lymph nodes in the left zone 3 region posterior to the internal jugular vein.  The nodal tissue had some firmness in places and was fluid filled in other places.  The tissues were sent for pathology.  DESCRIPTION OF PROCEDURE:  The patient was identified in the holding room, informed consent having been obtained including discussion of risks, benefits, and alternatives.  The patient was brought to the operative suite and put on the operative table in  supine position.  Anesthesia was induced and the patient was then intubated by the anesthesia team without difficulty.  The patient was given intravenous antibiotics during the case.  A shoulder roll was placed, and the eyes were taped closed.  The left  neck incision was marked with a marking pen and injected with 1% lidocaine with 1:100,000 epinephrine.  The left neck was prepped and draped in sterile fashion.  An incision was made with a 15-blade scalpel and extended through the subcutaneous tissue  and platysma muscle using Bovie electrocautery.  A  self-retaining retractor was added.  The sternocleidomastoid muscle was then skeletonized and retracted laterally, exposing the nodal tissue.  The internal jugular vein was also skeletonized, further  isolating the nodal tissue.  The nodes were then removed in a couple of different pieces using Bovie electrocautery.  Bleeding was controlled with ligation and bipolar electrocautery.  A couple of places during excision caused leakage of brown thin fluid  from the nodal mass.  Ultimately, the nodes were able to be removed and passed to nursing for pathology for lymphoma protocol.  The neck wound was copiously irrigated with saline and bleeding controlled with bipolar electrocautery.  A 7-French round  drain was placed in the depths of the wound and secured to the skin using 2-0 nylon suture and a standard drain stitch.  The platysmal layer was closed with 3-0 Vicryl suture in a simple interrupted fashion.  The skin was closed in the subcutaneous layer  with 4-0 Vicryl suture in a simple interrupted fashion and Dermabond on the skin level.  The patient was cleaned off and drapes were removed.  The drain was placed to bulb suction and taped to the left shoulder.  He was returned to Anesthesia for wakeup  and was extubated in the recovery room in stable condition.  LN/NUANCE  D:11/23/2018 T:11/23/2018 JOB:[id number]

## 2018-11-24 DIAGNOSIS — N400 Enlarged prostate without lower urinary tract symptoms: Secondary | ICD-10-CM | POA: Diagnosis not present

## 2018-11-24 DIAGNOSIS — Z85820 Personal history of malignant melanoma of skin: Secondary | ICD-10-CM | POA: Diagnosis not present

## 2018-11-24 DIAGNOSIS — C8311 Mantle cell lymphoma, lymph nodes of head, face, and neck: Secondary | ICD-10-CM | POA: Diagnosis not present

## 2018-11-24 DIAGNOSIS — Z8679 Personal history of other diseases of the circulatory system: Secondary | ICD-10-CM | POA: Diagnosis not present

## 2018-11-24 DIAGNOSIS — C73 Malignant neoplasm of thyroid gland: Secondary | ICD-10-CM | POA: Diagnosis not present

## 2018-11-24 DIAGNOSIS — I4892 Unspecified atrial flutter: Secondary | ICD-10-CM | POA: Diagnosis not present

## 2018-11-24 NOTE — Discharge Summary (Signed)
Physician Discharge Summary  Patient ID: ELVER GIACONA MRN: [id number] DOB/AGE: 04-29-1940 78 y.o.  Admit date: 11/23/2018 Discharge date: 11/24/2018  Admission Diagnoses: Lymphoma, cervical lymphadenopathy  Discharge Diagnoses:  Active Problems:   Lymphoma ([place]) Cervical lymphadenopathy  Discharged Condition: good  Hospital Course: 78 year old male recently treated for mantle cell lymphoma with persistent left cervical lymphadenopathy.  He presented for excisional biopsy.  See operative note.  He was observed overnight and did well aside from poor sleep.  His drain is removed on POD 1 and he is felt stable for discharge.  Consults: None  Significant Diagnostic Studies: None  Treatments: surgery: Left cervical lymph nodes excisional biopsy  Discharge Exam: Blood pressure (!) 146/73, pulse 79, temperature 97.6 F (36.4 C), temperature source Oral, resp. rate 18, height 6\' 1"  (1.854 m), weight 99 kg, SpO2 97 %. General appearance: alert, cooperative and no distress Neck: Left neck incision clean and intact, mild edema and tenderness, no fluid collection, drain removed  Disposition: Discharge disposition: 01-Home or Self Care       Discharge Instructions    Diet - low sodium heart healthy   Complete by: As directed    Discharge instructions   Complete by: As directed    OK to allow incision to get wet, gently pat dry.  Call with significant swelling, fever, or purulent drainage.  Avoid strenuous activity.   Increase activity slowly   Complete by: As directed      Allergies as of 11/24/2018      Reactions   Aspirin    Has been instructed not to take any blood thinners even aspirin due to history of several aneurysms    Levaquin [levofloxacin In D5w]    tendonitis   Nickel Itching   Other    No blood thinners due to history of several aneurysms    Statins Other (See Comments)   Muscle aches   Allopurinol Rash   Ampicillin Rash   Did it involve swelling of the  face/tongue/throat, SOB, or low BP? No Did it involve sudden or severe rash/hives, skin peeling, or any reaction on the inside of your mouth or nose? Yes Did you need to seek medical attention at a hospital or doctor's office? Yes When did it last happen?50+ years If all above answers are "NO", may proceed with cephalosporin use.      Medication List    TAKE these medications   acetaminophen 500 MG tablet Commonly known as: TYLENOL Take 1,000 mg by mouth daily as needed for moderate pain.   acyclovir 400 MG tablet Commonly known as: ZOVIRAX Take 1 tablet (400 mg total) by mouth daily. What changed: when to take this   alfuzosin 10 MG 24 hr tablet Commonly known as: UROXATRAL Take 10 mg by mouth at bedtime.   buPROPion 150 MG 24 hr tablet Commonly known as: WELLBUTRIN XL TAKE 1 TABLET DAILY   finasteride 5 MG tablet Commonly known as: PROSCAR Take 5 mg by mouth at bedtime.   lidocaine-prilocaine cream Commonly known as: EMLA Apply to affected area once What changed:   how much to take  how to take this  when to take this  reasons to take this  additional instructions   metoprolol succinate 25 MG 24 hr tablet Commonly known as: TOPROL-XL Take 12.5 mg by mouth daily.   SYSTANE HYDRATION PF OP Place 1 drop into both eyes 2 (two) times daily as needed (dry eyes).   Vitamin D 50 MCG (2000  UT) tablet Take 2,000 Units by mouth at bedtime.      Follow-up Information    Melida Quitter, MD. Schedule an appointment as soon as possible for a visit in 1 week(s).   Specialty: Otolaryngology Contact information: [address] [place] [address] [phone]           Signed: Melida Quitter 11/24/2018, 8:20 AM

## 2018-11-25 ENCOUNTER — Encounter (HOSPITAL_COMMUNITY): Payer: Self-pay | Admitting: Otolaryngology

## 2018-11-25 ENCOUNTER — Other Ambulatory Visit: Payer: Self-pay

## 2018-11-25 ENCOUNTER — Inpatient Hospital Stay: Payer: Medicare Other | Attending: Hematology and Oncology | Admitting: Hematology and Oncology

## 2018-11-25 DIAGNOSIS — Z20828 Contact with and (suspected) exposure to other viral communicable diseases: Secondary | ICD-10-CM | POA: Diagnosis not present

## 2018-11-25 DIAGNOSIS — Z79899 Other long term (current) drug therapy: Secondary | ICD-10-CM | POA: Insufficient documentation

## 2018-11-25 DIAGNOSIS — C8318 Mantle cell lymphoma, lymph nodes of multiple sites: Secondary | ICD-10-CM | POA: Diagnosis not present

## 2018-11-25 DIAGNOSIS — C73 Malignant neoplasm of thyroid gland: Secondary | ICD-10-CM

## 2018-11-26 ENCOUNTER — Encounter: Payer: Self-pay | Admitting: Hematology and Oncology

## 2018-11-26 ENCOUNTER — Other Ambulatory Visit: Payer: Self-pay | Admitting: Hematology and Oncology

## 2018-11-26 DIAGNOSIS — C73 Malignant neoplasm of thyroid gland: Secondary | ICD-10-CM | POA: Insufficient documentation

## 2018-11-26 NOTE — Assessment & Plan Note (Signed)
Based on recent findings, the patient had complete remission with treatment We will get him started on maintenance rituximab in the future after he has completed his surgery for thyroid cancer

## 2018-11-26 NOTE — Progress Notes (Signed)
DISCONTINUE ON PATHWAY REGIMEN - Lymphoma and CLL     A cycle is every 28 days:     Bendamustine      Rituximab   **Always confirm dose/schedule in your pharmacy ordering system**  REASON: Other Reason PRIOR TREATMENT: [id number]: Bendamustine + Rituximab IV (90/375) q28 Days x 6 Cycles (or 2 Cycles Beyond CR) TREATMENT RESPONSE: Complete Response (CR)  START ON PATHWAY REGIMEN - Lymphoma and CLL     A cycle is every 2 months:     Rituximab-xxxx   **Always confirm dose/schedule in your pharmacy ordering system**  Patient Characteristics: Mantle Cell Lymphoma, Maintenance Therapy Disease Type: Not Applicable Disease Type: Mantle Cell Lymphoma Disease Type: Not Applicable Line of Therapy: Maintenance Therapy [place] Stage: III Intent of Therapy: Curative Intent, Discussed with Patient

## 2018-11-26 NOTE — Progress Notes (Signed)
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[facility]) I have reviewed and given the patient a copy of his pathology report I also spoke with his ENT surgeon to arrange for further evaluation and thyroidectomy I will see the patient back a few weeks after his surgery for final staging The patient may or may not need adjuvant radioactive iodine treatment afterwards  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   INTERVAL HISTORY: Please see below for problem oriented charting. 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 2. 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 3. Slight reduction in size of right retroperitoneal evolving hematoma.   03/30/2018 Imaging   Ct neck 1. Extensive adenopathy on the left. The largest node is a level 1/submandibular node measuring 38 x 24 x 22 mm. Largest level 2 node measures 3.2 x 2 x 2 cm. Numerous other smaller but round lymph nodes throughout the left  neck in the level 2 through level 4 region. The largest level 5 node measures 3.4 x 3.4 x 2.3 cm with a transverse diameter of 2.3 cm. This contains some internal calcifications. There are numerous other pathologic nodes in the left supraclavicular to axillary region. This pattern of disease could be due to extensive left neck metastatic disease from unknown primary, lymphoma, or other systemic malignancy. 2. No evidence of mucosal or submucosal lesion. 3. Diameter of the ascending aorta is 4.5 cm. Recommend annual imaging followup by CTA or MRA. Aortic Atherosclerosis (ICD10-I70.0).    04/10/2018 Pathology Results   Left zone 1 neck mass, [name], [name], the findings indicate an atypical lymphoid proliferation but are too limited for definitive diagnosis. Excisional biopsy with flow cytometry is recommended.   04/10/2018 Procedure   He was ENT who performed FNA of neck LN   04/27/2018 Pathology Results   Lymph node for lymphoma, [name], clumped chromatin. By  immunohistochemistry the lymphocytes are B-cells with expression of CD20, [name], bcl-2 and cyclin-D1.  CD10, [name], [name], [name], the features are consistent with a mantle cell lymphoma   05/13/2018 PET scan   1. Hypermetabolic left cervical lymphadenopathy, left axillary lymphadenopathy, mediastinal / right hilar lymphadenopathy, right external iliac lymphadenopathy, and subcentimeter left external iliac and left inguinal lymph nodes, [name], [name], [name], without focal lesions, that may represent lymphomatous involvement of the spleen.   05/21/2018 Cancer Staging   Staging form: Hodgkin and Non-Hodgkin Lymphoma, AJCC 8th Edition - Clinical: Stage III - Signed by Heath Lark, MD on 05/21/2018   05/26/2018 Procedure   Ultrasound and fluoroscopically guided right internal jugular single lumen power port catheter insertion. Tip in the SVC/RA junction. Catheter ready for use.    06/01/2018 -  Chemotherapy   The patient had Bendamustine and Rituximab for chemotherapy treatment   08/24/2018 PET scan   1. Partial response to therapy with complete resolution of size and metabolic activity of the dominant LEFT submandibular node (level 1) and LEFT axillary nodes. 2. Persistent and increased metabolic activity of left level 3 lymph nodes ( [place] 4). Nodes are partially calcified and similar size. 3. No new metastatic disease. 4. Normal spleen and bone marrow.   11/09/2018 PET scan   IMPRESSION: 1. Mild response to therapy of hypermetabolic left cervical nodes. ([place]) 4. 2. No new or progressive disease. 3. Coronary artery atherosclerosis. Aortic Atherosclerosis  (ICD10-I70.0). 4. Trace cul-de-sac fluid, similar.   12/25/2018 -  Chemotherapy   The patient had riTUXimab (RITUXAN) 900 mg in sodium chloride 0.9 % 250 mL (2.6471 mg/mL) infusion, 375 mg/m2 = 900 mg, Intravenous,  Once, 0 of 4 cycles  for chemotherapy treatment.    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([place])  11/23/2018 Pathology Results   Lymph node for lymphoma, Left zone 3 - PAPILLARY THYROID CARCINOMA. - SEE MICROSCOPIC DESCRIPTION. Microscopic Comment The specimen consists mostly of encapsulated papillary thyroid carcinoma. There are portions of lymphoid tissue attached to the periphery of the specimen and in the adjacent adipose tissue which suggests that this may represent papillary carcinoma metastatic to a lymph node.   11/23/2018 Surgery   PREOPERATIVE DIAGNOSIS:  Mantle cell lymphoma and cervical lymphadenopathy.   POSTOPERATIVE DIAGNOSIS:  Mantle cell lymphoma and cervical lymphadenopathy.   PROCEDURE:  Excisional biopsy of left cervical lymph nodes.       REVIEW OF SYSTEMS:   Constitutional: Denies fevers, chills or abnormal weight loss Eyes: Denies blurriness of vision Ears, nose, mouth, throat, and face: Denies mucositis or sore throat Respiratory: Denies cough, dyspnea or wheezes Cardiovascular: Denies palpitation, chest discomfort or lower extremity swelling Gastrointestinal:  Denies nausea, heartburn or change in bowel habits Skin: Denies abnormal skin rashes Lymphatics: Denies new lymphadenopathy or easy bruising Neurological:Denies numbness, tingling or new weaknesses Behavioral/Psych: Mood is stable, no new changes  All other systems were reviewed with the patient and are negative.  I have reviewed the past medical history, past surgical history, social history and family history with the patient and they are unchanged from previous note.  ALLERGIES:  is allergic to aspirin; levaquin [levofloxacin in d5w]; nickel;  other; statins; allopurinol; and  ampicillin.  MEDICATIONS:  Current Outpatient Medications  Medication Sig Dispense Refill  . acetaminophen (TYLENOL) 500 MG tablet Take 1,000 mg by mouth daily as needed for moderate pain.    Marland Kitchen acyclovir (ZOVIRAX) 400 MG tablet Take 1 tablet (400 mg total) by mouth daily. (Patient taking differently: Take 400 mg by mouth daily at 12 noon. ) 90 tablet 3  . alfuzosin (UROXATRAL) 10 MG 24 hr tablet Take 10 mg by mouth at bedtime.     Marland Kitchen buPROPion (WELLBUTRIN XL) 150 MG 24 hr tablet TAKE 1 TABLET DAILY (Patient taking differently: Take 150 mg by mouth daily. ) 90 tablet 3  . Cholecalciferol (VITAMIN D) 2000 units tablet Take 2,000 Units by mouth at bedtime.     . finasteride (PROSCAR) 5 MG tablet Take 5 mg by mouth at bedtime.     . lidocaine-prilocaine (EMLA) cream Apply to affected area once (Patient taking differently: Apply 1 application topically daily as needed (port access). ) 30 g 3  . metoprolol succinate (TOPROL-XL) 25 MG 24 hr tablet Take 12.5 mg by mouth daily.     Vladimir Faster Glycol-Propyl Glycol (SYSTANE HYDRATION PF OP) Place 1 drop into both eyes 2 (two) times daily as needed (dry eyes).     No current facility-administered medications for this visit.     PHYSICAL EXAMINATION: ECOG PERFORMANCE STATUS: 0 - Asymptomatic  Vitals:   11/25/18 1212  BP: 126/68  Pulse: 66  Resp: 18  Temp: 98.6 F (37 C)  SpO2: 98%   Filed Weights   11/25/18 1212  Weight: 224 lb 9.6 oz (101.9 kg)    GENERAL:alert, no distress and comfortable NEURO: alert & oriented x 3 with fluent speech, no focal motor/sensory deficits  LABORATORY DATA:  I have reviewed the data as listed    Component Value Date/Time   NA 140 11/09/2018 0815   K 4.2 11/09/2018 0815   CL 107 11/09/2018 0815   CO2 23 11/09/2018 0815   GLUCOSE 93 11/09/2018 0815   BUN 16 11/09/2018 0815   CREATININE 1.13 11/09/2018 0815   CREATININE 1.16 10/20/2018 0934   CALCIUM 8.8 (L) 11/09/2018 0815   PROT 6.6 11/09/2018 0815    PROT 6.6 07/23/2017 1504   ALBUMIN 4.0 11/09/2018 0815   AST 18 11/09/2018 0815   AST 20 10/20/2018 0934   ALT 20 11/09/2018 0815   ALT 17 10/20/2018 0934   ALKPHOS 51 11/09/2018 0815   BILITOT 0.7 11/09/2018 0815   BILITOT 1.2 10/20/2018 0934   GFRNONAA >60 11/09/2018 0815   GFRNONAA >60 10/20/2018 0934   GFRAA >60 11/09/2018 0815   GFRAA >60 10/20/2018 0934    No results found for: SPEP, UPEP  Lab Results  Component Value Date   WBC 3.8 (L) 11/09/2018   NEUTROABS 2.6 11/09/2018   HGB 12.8 (L) 11/09/2018   HCT 38.0 (L) 11/09/2018   MCV 92.5 11/09/2018   PLT 163 11/09/2018      Chemistry      Component Value Date/Time   NA 140 11/09/2018 0815   K 4.2 11/09/2018 0815   CL 107 11/09/2018 0815   CO2 23 11/09/2018 0815   BUN 16 11/09/2018 0815   CREATININE 1.13 11/09/2018 0815   CREATININE 1.16 10/20/2018 0934      Component Value Date/Time   CALCIUM 8.8 (L) 11/09/2018 0815   ALKPHOS 51 11/09/2018 0815   AST 18 11/09/2018 0815   AST 20 10/20/2018 0934   ALT  20 11/09/2018 0815   ALT 17 10/20/2018 0934   BILITOT 0.7 11/09/2018 0815   BILITOT 1.2 10/20/2018 0934       RADIOGRAPHIC STUDIES: I have personally reviewed the radiological images as listed and agreed with the findings in the report. Nm Pet Image Restag (ps) Skull Base To Thigh  Result Date: 11/09/2018 CLINICAL DATA:  Subsequent treatment strategy for restaging of mantle cell lymphoma. EXAM: NUCLEAR MEDICINE PET SKULL BASE TO THIGH TECHNIQUE: 10.7 mCi F-18 FDG was injected intravenously. Full-ring PET imaging was performed from the skull base to thigh after the radiotracer. CT data was obtained and used for attenuation correction and anatomic localization. Fasting blood glucose: 95 mg/dl COMPARISON:  08/24/2018 FINDINGS: Mediastinal blood pool activity: SUV max 2.4 Liver activity: SUV max 4.2 NECK: Left posterior triangle calcified node measures 7 mm and a S.U.V. max of 3.6 on image 32/4. Compare 8 mm and a  S.U.V. max of 3.6 on the prior exam. Left-sided level 3 nodes measure up to 1.6 cm and a S.U.V. max of 6.7 today versus 1.7 cm and a S.U.V. max of 7.7 on the prior. Incidental CT findings: none CHEST: No pulmonary parenchymal or thoracic nodal hypermetabolism. A focus of hypermetabolism about the anterior chest wall, superficial to the sternum, is without CT correlate and measures a S.U.V. max of 4.2, including on approximately image 96/4. Incidental CT findings: Right Port-A-Cath tip at low SVC. Mild cardiomegaly. Lad coronary and aortic atherosclerosis. Fourth anterior right rib fixation. ABDOMEN/PELVIS: No abdominopelvic parenchymal or nodal hypermetabolism. Incidental CT findings: Embolization coils in the splenic artery distribution. Low-density 1.7 cm left renal lesion is likely a cyst. Left extrarenal pelvis. Normal adrenal glands. Abdominal aortic atherosclerosis. Moderate prostatomegaly. Trace cul-de-sac fluid is similar. Celiac ectasia at up to 1.5 cm is not significantly changed. Fat containing left inguinal hernia. SKELETON: No abnormal marrow activity. Incidental CT findings: none IMPRESSION: 1. Mild response to therapy of hypermetabolic left cervical nodes. ([place]) 4. 2. No new or progressive disease. 3. Coronary artery atherosclerosis. Aortic Atherosclerosis (ICD10-I70.0). 4. Trace cul-de-sac fluid, similar. Electronically Signed   By: Abigail Miyamoto M.D.   On: 11/09/2018 10:36    All questions were answered. The patient knows to call the clinic with any problems, questions or concerns. No barriers to learning was detected.  I spent 25 minutes counseling the patient face to face. The total time spent in the appointment was 30 minutes and more than 50% was on counseling and review of test results  Heath Lark, MD 11/26/2018 9:47 AM

## 2018-11-26 NOTE — Assessment & Plan Note (Addendum)
I have reviewed and given the patient a copy of his pathology report I also spoke with his ENT surgeon to arrange for further evaluation and thyroidectomy I will see the patient back a few weeks after his surgery for final staging The patient may or may not need adjuvant radioactive iodine treatment afterwards

## 2018-11-27 ENCOUNTER — Ambulatory Visit: Payer: Medicare Other | Admitting: Hematology and Oncology

## 2018-11-29 ENCOUNTER — Encounter: Payer: Self-pay | Admitting: Pharmacist

## 2018-11-29 NOTE — Progress Notes (Signed)
The following biosimilar Ruxience (rituximab-pvvr) has been selected for use in this patient.  MD plans to start after pt has thyroid surgery.  Appts not sched for inf yet.  1st dose will inf as standard rate.  If tol 1st dose, can switch to rapid rituximab-pvvr.  Kennith Center, Pharm.D., CPP 11/29/2018@6 :57 PM

## 2018-12-01 ENCOUNTER — Other Ambulatory Visit: Payer: Self-pay | Admitting: Otolaryngology

## 2018-12-01 ENCOUNTER — Ambulatory Visit
Admission: RE | Admit: 2018-12-01 | Discharge: 2018-12-01 | Disposition: A | Discharge status: Home / Self Care | Payer: Medicare Other | Source: Ambulatory Visit | Attending: Otolaryngology | Admitting: Otolaryngology

## 2018-12-01 DIAGNOSIS — C73 Malignant neoplasm of thyroid gland: Secondary | ICD-10-CM

## 2018-12-01 DIAGNOSIS — E041 Nontoxic single thyroid nodule: Secondary | ICD-10-CM | POA: Diagnosis not present

## 2018-12-02 DIAGNOSIS — C799 Secondary malignant neoplasm of unspecified site: Secondary | ICD-10-CM | POA: Insufficient documentation

## 2018-12-02 DIAGNOSIS — Z7289 Other problems related to lifestyle: Secondary | ICD-10-CM | POA: Diagnosis not present

## 2018-12-02 DIAGNOSIS — C73 Malignant neoplasm of thyroid gland: Secondary | ICD-10-CM | POA: Diagnosis not present

## 2018-12-03 DIAGNOSIS — Z23 Encounter for immunization: Secondary | ICD-10-CM | POA: Diagnosis not present

## 2018-12-07 ENCOUNTER — Other Ambulatory Visit: Payer: Self-pay | Admitting: Otolaryngology

## 2018-12-07 ENCOUNTER — Encounter: Payer: Self-pay | Admitting: Hematology and Oncology

## 2018-12-07 DIAGNOSIS — C73 Malignant neoplasm of thyroid gland: Secondary | ICD-10-CM

## 2018-12-10 ENCOUNTER — Telehealth: Payer: Self-pay | Admitting: Hematology and Oncology

## 2018-12-10 NOTE — Telephone Encounter (Signed)
Scheduled appt per 9/15 sch message - pt aware of appt date and time

## 2018-12-15 DIAGNOSIS — R590 Localized enlarged lymph nodes: Secondary | ICD-10-CM | POA: Diagnosis not present

## 2018-12-15 DIAGNOSIS — R911 Solitary pulmonary nodule: Secondary | ICD-10-CM | POA: Diagnosis not present

## 2018-12-15 DIAGNOSIS — I722 Aneurysm of renal artery: Secondary | ICD-10-CM | POA: Diagnosis not present

## 2018-12-15 DIAGNOSIS — I728 Aneurysm of other specified arteries: Secondary | ICD-10-CM | POA: Diagnosis not present

## 2018-12-15 DIAGNOSIS — Z8572 Personal history of non-Hodgkin lymphomas: Secondary | ICD-10-CM | POA: Diagnosis not present

## 2018-12-15 DIAGNOSIS — C73 Malignant neoplasm of thyroid gland: Secondary | ICD-10-CM | POA: Diagnosis not present

## 2018-12-20 ENCOUNTER — Encounter: Payer: Self-pay | Admitting: Hematology and Oncology

## 2018-12-21 DIAGNOSIS — I722 Aneurysm of renal artery: Secondary | ICD-10-CM | POA: Diagnosis not present

## 2018-12-23 DIAGNOSIS — I10 Essential (primary) hypertension: Secondary | ICD-10-CM | POA: Diagnosis not present

## 2018-12-23 DIAGNOSIS — E785 Hyperlipidemia, unspecified: Secondary | ICD-10-CM | POA: Diagnosis not present

## 2018-12-24 DIAGNOSIS — I5032 Chronic diastolic (congestive) heart failure: Secondary | ICD-10-CM | POA: Diagnosis not present

## 2018-12-24 DIAGNOSIS — I483 Typical atrial flutter: Secondary | ICD-10-CM | POA: Diagnosis not present

## 2018-12-24 DIAGNOSIS — E782 Mixed hyperlipidemia: Secondary | ICD-10-CM | POA: Diagnosis not present

## 2018-12-30 ENCOUNTER — Other Ambulatory Visit: Payer: Self-pay | Admitting: Otolaryngology

## 2019-01-11 NOTE — Progress Notes (Signed)
[place], [place] [address] Phone: [phone] Fax: [phone]  [facility] - Vernia Buff, [place] [address] Phone: [phone] Fax: [phone]  [facility] [place], [place] [address] [place] [address] Phone: [phone] Fax: [phone]      Your procedure is scheduled on October 23rd, 2020.  Report to [facility] Main Entrance "A" at 8:30 A.M., and check in at the Admitting office.   Call this number if you have problems the morning of surgery:  [phone]  Call [phone] if you have any questions prior to your surgery date Monday-Friday 8am-4pm    Remember:  Do not eat or drink after midnight the night before your surgery     Take these medicines the morning of surgery with A SIP OF WATER:  Acetaminophen (Tylenol) - if needed Acyclovir (Zovirax) Bupropion (Wellbutrin XL) Metoprolol Succinate (TOPROL-XL) Polyethyl Glycol-Propyl Glycol (Systane Hydration PF OP) - if needed  7 days prior to surgery STOP taking any Aspirin (unless otherwise instructed by your surgeon), Aleve, Naproxen, Ibuprofen, Motrin, Advil, Goody's, BC's, all herbal medications, fish oil, and all vitamins.    The Morning of Surgery  Do not wear jewelry, make-up or nail polish.  Do not wear lotions, powders, or perfumes/colognes, or deodorant  Do not shave 48 hours prior to surgery.  Men may shave face and neck.  Do not bring valuables to the hospital.  [facility] is not responsible for any belongings or valuables.  If you are a smoker, DO NOT Smoke 24 hours prior to surgery IF you wear a CPAP at night please bring your mask, tubing, and machine the morning of surgery   Remember that you must have someone to transport you home after your surgery, and remain with you for  24 hours if you are discharged the same day.   Contacts, glasses, hearing aids, dentures or bridgework may not be worn into surgery.    Leave your suitcase in the car.  After surgery it may be brought to your room.  For patients admitted to the hospital, discharge time will be determined by your treatment team.  Patients discharged the day of surgery will not be allowed to drive home.    Special instructions:   [facility]- Preparing For Surgery  Before surgery, you can play an important role. Because skin is not sterile, your skin needs to be as free of germs as possible. You can reduce the number of germs on your skin by washing with CHG (chlorahexidine gluconate) Soap before surgery.  CHG is an antiseptic cleaner which kills germs and bonds with the skin to continue killing germs even after washing.    Oral Hygiene is also important to reduce your risk of infection.  Remember - BRUSH YOUR TEETH THE MORNING OF SURGERY WITH YOUR REGULAR TOOTHPASTE  Please do not use if you have an allergy to CHG or antibacterial soaps. If your skin becomes reddened/irritated stop using the CHG.  Do not shave (including legs and underarms) for at least 48 hours prior to first CHG shower. It is OK to shave your face.  Please follow these instructions carefully.   1. Shower the NIGHT BEFORE SURGERY and the MORNING OF SURGERY with CHG Soap.   2. If you chose to wash your hair, wash  your hair first as usual with your normal shampoo.  3. After you shampoo, rinse your hair and body thoroughly to remove the shampoo.  4. Use CHG as you would any other liquid soap. You can apply CHG directly to the skin and wash gently with a scrungie or a clean washcloth.   5. Apply the CHG Soap to your body ONLY FROM THE NECK DOWN.  Do not use on open wounds or open sores. Avoid contact with your eyes, ears, mouth and genitals (private parts). Wash Face and genitals (private parts)  with your normal soap.   6. Wash  thoroughly, paying special attention to the area where your surgery will be performed.  7. Thoroughly rinse your body with warm water from the neck down.  8. DO NOT shower/wash with your normal soap after using and rinsing off the CHG Soap.  9. Pat yourself dry with a CLEAN TOWEL.  10. Wear CLEAN PAJAMAS to bed the night before surgery, wear comfortable clothes the morning of surgery  11. Place CLEAN SHEETS on your bed the night of your first shower and DO NOT SLEEP WITH PETS.    Day of Surgery:  Do not apply any deodorants/lotions. Please shower the morning of surgery with the CHG soap  Please wear clean clothes to the hospital/surgery center.   Remember to brush your teeth WITH YOUR REGULAR TOOTHPASTE.   Please read over the following fact sheets that you were given.

## 2019-01-12 ENCOUNTER — Other Ambulatory Visit (HOSPITAL_COMMUNITY)
Admission: RE | Admit: 2019-01-12 | Discharge: 2019-01-12 | Disposition: A | Discharge status: Home / Self Care | Payer: Medicare Other | Source: Ambulatory Visit | Attending: Otolaryngology | Admitting: Otolaryngology

## 2019-01-12 ENCOUNTER — Other Ambulatory Visit: Payer: Self-pay

## 2019-01-12 ENCOUNTER — Encounter (HOSPITAL_COMMUNITY)
Admission: RE | Admit: 2019-01-12 | Discharge: 2019-01-12 | Disposition: A | Discharge status: Home / Self Care | Payer: Medicare Other | Source: Ambulatory Visit | Attending: Otolaryngology | Admitting: Otolaryngology

## 2019-01-12 ENCOUNTER — Encounter: Payer: Self-pay | Admitting: Hematology and Oncology

## 2019-01-12 ENCOUNTER — Telehealth: Payer: Self-pay | Admitting: Hematology and Oncology

## 2019-01-12 ENCOUNTER — Encounter (HOSPITAL_COMMUNITY): Payer: Self-pay

## 2019-01-12 ENCOUNTER — Encounter (HOSPITAL_COMMUNITY)
Admission: RE | Admit: 2019-01-12 | Discharge: 2019-01-12 | Disposition: A | Discharge status: Home / Self Care | Payer: Medicare Other | Source: Ambulatory Visit | Attending: Anesthesiology | Admitting: Anesthesiology

## 2019-01-12 DIAGNOSIS — C73 Malignant neoplasm of thyroid gland: Secondary | ICD-10-CM | POA: Insufficient documentation

## 2019-01-12 DIAGNOSIS — Z01818 Encounter for other preprocedural examination: Secondary | ICD-10-CM | POA: Insufficient documentation

## 2019-01-12 DIAGNOSIS — Z20828 Contact with and (suspected) exposure to other viral communicable diseases: Secondary | ICD-10-CM | POA: Insufficient documentation

## 2019-01-12 DIAGNOSIS — C799 Secondary malignant neoplasm of unspecified site: Secondary | ICD-10-CM | POA: Insufficient documentation

## 2019-01-12 HISTORY — DX: Malignant neoplasm of thyroid gland: C73

## 2019-01-12 LAB — BASIC METABOLIC PANEL
Anion gap: 10 (ref 5–15)
BUN: 22 mg/dL (ref 8–23)
CO2: 24 mmol/L (ref 22–32)
Calcium: 9.3 mg/dL (ref 8.9–10.3)
Chloride: 105 mmol/L (ref 98–111)
Creatinine, Ser: 1.83 mg/dL — ABNORMAL HIGH (ref 0.61–1.24)
GFR calc Af Amer: 40 mL/min — ABNORMAL LOW (ref >60)
GFR calc non Af Amer: 35 mL/min — ABNORMAL LOW (ref >60)
Glucose, Bld: 84 mg/dL (ref 70–99)
Potassium: 4.1 mmol/L (ref 3.5–5.1)
Sodium: 139 mmol/L (ref 135–145)

## 2019-01-12 LAB — CBC
HCT: 38.7 % — ABNORMAL LOW (ref 39.0–52.0)
Hemoglobin: 13.1 g/dL (ref 13.0–17.0)
MCH: 31.9 pg (ref 26.0–34.0)
MCHC: 33.9 g/dL (ref 30.0–36.0)
MCV: 94.2 fL (ref 80.0–100.0)
Platelets: 149 10*3/uL — ABNORMAL LOW (ref 150–400)
RBC: 4.11 MIL/uL — ABNORMAL LOW (ref 4.22–5.81)
RDW: 12.5 % (ref 11.5–15.5)
WBC: 2.2 10*3/uL — ABNORMAL LOW (ref 4.0–10.5)
nRBC: 0 % (ref 0.0–0.2)

## 2019-01-12 NOTE — Telephone Encounter (Signed)
Scheduled appt per 10/20 sch message - called pt - pt is aware of appt date and time

## 2019-01-12 NOTE — Progress Notes (Signed)
PCP - Dr. [name] Cardiologist - Dr. Cloretta Ned [facility] - Kathlene Cote, PA  PPM/ICD - n/a Device Orders -  Rep Notified -   Chest x-ray - 01/12/2019  EKG - 11/23/2018 Stress Test - 06/2006 ECHO - 06/22/2013 Cardiac Cath - 2006  Sleep Study - 09/10/2017 CPAP - no  Fasting Blood Sugar - n/a Checks Blood Sugar _____ times a day  Blood Thinner Instructions: n/a Aspirin Instructions: n/a  ERAS Protcol - n/a PRE-SURGERY Ensure or G2-   COVID TEST- scheduled for test following PAT appointment; re-educated about quarantining   Anesthesia review: yes, cardiac history, AAA, MVR, A-flutter  Patient denies shortness of breath, fever, cough and chest pain at PAT appointment   All instructions explained to the patient, with a verbal understanding of the material. Patient agrees to go over the instructions while at home for a better understanding. Patient also instructed to self quarantine after being tested for COVID-19. The opportunity to ask questions was provided.

## 2019-01-13 ENCOUNTER — Inpatient Hospital Stay: Payer: Medicare Other | Attending: Hematology and Oncology

## 2019-01-13 ENCOUNTER — Other Ambulatory Visit: Payer: Self-pay

## 2019-01-13 DIAGNOSIS — C8318 Mantle cell lymphoma, lymph nodes of multiple sites: Secondary | ICD-10-CM

## 2019-01-13 LAB — NOVEL CORONAVIRUS, NAA (HOSP ORDER, SEND-OUT TO REF LAB; TAT 18-24 HRS): SARS-CoV-2, NAA: NOT DETECTED

## 2019-01-13 MED ORDER — SODIUM CHLORIDE 0.9% FLUSH
10.0000 mL | Freq: Once | INTRAVENOUS | Status: AC
  Administered 2019-01-13: 10 mL
  Filled 2019-01-13: qty 10

## 2019-01-13 MED ORDER — HEPARIN SOD (PORK) LOCK FLUSH 100 UNIT/ML IV SOLN
250.0000 [IU] | Freq: Once | INTRAVENOUS | Status: DC
  Filled 2019-01-13: qty 5

## 2019-01-13 MED ORDER — HEPARIN SOD (PORK) LOCK FLUSH 100 UNIT/ML IV SOLN
500.0000 [IU] | Freq: Once | INTRAVENOUS | Status: AC
  Administered 2019-01-13: 500 [IU]
  Filled 2019-01-13: qty 5

## 2019-01-13 NOTE — Anesthesia Preprocedure Evaluation (Addendum)
Anesthesia Evaluation  Patient identified by MRN, date of birth, ID band Patient awake    Reviewed: Allergy & Precautions, NPO status , Patient's Chart, lab work & pertinent test results, reviewed documented beta blocker date and time   Airway Mallampati: II  TM Distance: >3 FB Neck ROM: Full    Dental  (+) Dental Advisory Given, Teeth Intact   Pulmonary sleep apnea ,    breath sounds clear to auscultation       Cardiovascular hypertension, Pt. on home beta blockers + Peripheral Vascular Disease  + dysrhythmias  Rhythm:Regular Rate:Normal     Neuro/Psych  Neuromuscular disease    GI/Hepatic negative GI ROS, Neg liver ROS,   Endo/Other  negative endocrine ROS  Renal/GU Renal disease     Musculoskeletal   Abdominal   Peds  Hematology  (+) anemia ,   Anesthesia Other Findings   Reproductive/Obstetrics                            Lab Results  Component Value Date   WBC 2.2 (L) 01/12/2019   HGB 12.2 (L) 01/15/2019   HCT 36.0 (L) 01/15/2019   MCV 94.2 01/12/2019   PLT 149 (L) 01/12/2019   Lab Results  Component Value Date   CREATININE 1.20 01/15/2019   BUN 17 01/15/2019   NA 141 01/15/2019   K 4.1 01/15/2019   CL 105 01/15/2019   CO2 24 01/12/2019    Anesthesia Physical Anesthesia Plan  ASA: III  Anesthesia Plan: General   Post-op Pain Management:    Induction: Intravenous  PONV Risk Score and Plan: 2 and Dexamethasone, Ondansetron and Treatment may vary due to age or medical condition  Airway Management Planned: Oral ETT  Additional Equipment:   Intra-op Plan:   Post-operative Plan: Extubation in OR  Informed Consent: I have reviewed the patients History and Physical, chart, labs and discussed the procedure including the risks, benefits and alternatives for the proposed anesthesia with the patient or authorized representative who has indicated his/her understanding and  acceptance.     Dental advisory given  Plan Discussed with: CRNA  Anesthesia Plan Comments: (Follows with Cardiology, Dr. Edwin Dada at [facility], for history of chronic systolic heart failure now with recovered EF, atrial flutter s/p CTI ablation 07/2013, myxomatous mitral valve s/p MV ring in 2002, HLD, NSVT. He was last seen 12/24/18, stable, no changes to regimen.   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 Plan repeat imaging in 2021 post chemo.   Preop labs reviewed with elevated creatinine 1.83, baseline ~1.20. Review of previous records shows similar elevations in March of this year at 1.62 and 1.56. At that time he was noted to be orthostatic, advised to increase fluid intake, creatinine subsequently normalized. This result was called to Dr. Redmond Baseman. I also called the pt to encourage hydration. Will repeat labs DOS to follow trend.  Cardiac stress MRI 11/24/13 (care everywhere): Regadenoson stress cardiac MRI with and without contrast was performed on a 3T MR scanner to evaluate myocardial morphology, function, viability, ischemia, and assess for infiltrative disease.  1. The left ventricle is upper limit of normal in end-diastolic volume, and mild enlarged in end-systolic volume, with normal wall thickness. Global systolic function is mildly reduced. The LV ejection fraction is 51%. There are no regional wall motion abnormalities.  LV volumes absolute and indexed to BSA with age-  and gender matched normal values in parenthesis (mean, 95% CI) are as follows:  EDV 199 ml (146, 105-187) ESV 98 ml (47, 25-70) EDVI 92 ml/m2 (75, 58-93) ESVI 45 ml/m2 (24, 13-35)  2. The right ventricle is normal in volumes, and wall thickness. Global systolic function is normal, the quantitative RVEF is 57%. There are no regional wall motion abnormalities.  RV volumes absolute and indexed to BSA with age- and gender  matched normal values in parenthesis (mean, 95% CI) are as follows:  EDV 180 ml (150, 100-200) ESV 78 ml (46, 16-76) EDVI 83 ml/m2 (75, 52-98) ESVI 36 ml/m2 (23, 8-37)  3. Both atria are mild-moderately dilated.  4. The aortic valve is trileaflet, there is no significant aortic valve stenosis or regurgitation. There is mild mitral regurgitation (s/p mitral ring), and mild tricuspid regurgitation, no other significant valvular disease seen.   5. Delayed enhancement imaging demonstrates no evidence of myocardial infarction, scar or infiltrative disease.   6. Regadenoson stress perfusion imaging demonstrates no evidence of inducible myocardial ischemia. )       Anesthesia Quick Evaluation

## 2019-01-13 NOTE — Progress Notes (Signed)
Anesthesia Chart Review: Follows with Cardiology, Dr. 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 He was last seen 12/24/18, stable, no changes to regimen.   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 Plan repeat imaging in 2021 post chemo.   Preop labs reviewed with elevated creatinine 1.83, baseline ~1.20. Review of previous records shows similar elevations in March of this year at 1.62 and 1.56. At that time he was noted to be orthostatic, advised to increase fluid intake, creatinine subsequently normalized. This result was called to Dr. Redmond Baseman. I also called the pt to encourage hydration. Will repeat labs DOS to follow trend.  Cardiac stress MRI 11/24/13 (care everywhere): Regadenoson stress cardiac MRI with and without contrast was performed on a 3T MR scanner to evaluate myocardial morphology, function, viability, ischemia, and assess for infiltrative disease.  1. The left ventricle is upper limit of normal in end-diastolic volume, and mild enlarged in end-systolic volume, with normal wall thickness. Global systolic function is mildly reduced. The LV ejection fraction is 51%. There are no regional wall motion abnormalities.  LV volumes absolute and indexed to BSA with age- and gender matched normal values in parenthesis (mean, 95% CI) are as follows:  EDV 199 ml (146, 105-187) ESV 98 ml (47, 25-70) EDVI 92 ml/m2 (75, 58-93) ESVI 45 ml/m2 (24, 13-35)  2. The right ventricle is normal in volumes, and wall thickness. Global systolic function is normal, the quantitative RVEF is 57%. There are no regional wall motion abnormalities.  RV volumes absolute and indexed to BSA with age- and gender matched normal values in parenthesis (mean, 95% CI) are as  follows:  EDV 180 ml (150, 100-200) ESV 78 ml (46, 16-76) EDVI 83 ml/m2 (75, 52-98) ESVI 36 ml/m2 (23, 8-37)  3. Both atria are mild-moderately dilated.  4. The aortic valve is trileaflet, there is no significant aortic valve stenosis or regurgitation. There is mild mitral regurgitation (s/p mitral ring), and mild tricuspid regurgitation, no other significant valvular disease seen.   5. Delayed enhancement imaging demonstrates no evidence of myocardial infarction, scar or infiltrative disease.   6. Regadenoson stress perfusion imaging demonstrates no evidence of inducible myocardial ischemia.    Wynonia Musty [facility]/Anesthesiology Phone [phone] 01/13/2019 1:10 PM

## 2019-01-15 ENCOUNTER — Encounter (HOSPITAL_COMMUNITY): Payer: Self-pay

## 2019-01-15 ENCOUNTER — Inpatient Hospital Stay (HOSPITAL_COMMUNITY): Payer: Medicare Other | Admitting: Physician Assistant

## 2019-01-15 ENCOUNTER — Other Ambulatory Visit: Payer: Self-pay

## 2019-01-15 ENCOUNTER — Inpatient Hospital Stay (HOSPITAL_COMMUNITY)
Admission: RE | Admit: 2019-01-15 | Discharge: 2019-01-17 | DRG: 627 | Disposition: A | Discharge status: Home / Self Care | Payer: Medicare Other | Attending: Otolaryngology | Admitting: Otolaryngology

## 2019-01-15 ENCOUNTER — Encounter (HOSPITAL_COMMUNITY)
Admission: RE | Disposition: A | Discharge status: Home / Self Care | Payer: Self-pay | Source: Home / Self Care | Attending: Otolaryngology

## 2019-01-15 DIAGNOSIS — I739 Peripheral vascular disease, unspecified: Secondary | ICD-10-CM | POA: Diagnosis not present

## 2019-01-15 DIAGNOSIS — Z85820 Personal history of malignant melanoma of skin: Secondary | ICD-10-CM | POA: Diagnosis not present

## 2019-01-15 DIAGNOSIS — Z20828 Contact with and (suspected) exposure to other viral communicable diseases: Secondary | ICD-10-CM | POA: Diagnosis present

## 2019-01-15 DIAGNOSIS — C73 Malignant neoplasm of thyroid gland: Secondary | ICD-10-CM | POA: Diagnosis present

## 2019-01-15 DIAGNOSIS — Z8249 Family history of ischemic heart disease and other diseases of the circulatory system: Secondary | ICD-10-CM

## 2019-01-15 DIAGNOSIS — Z8572 Personal history of non-Hodgkin lymphomas: Secondary | ICD-10-CM

## 2019-01-15 DIAGNOSIS — G4733 Obstructive sleep apnea (adult) (pediatric): Secondary | ICD-10-CM | POA: Diagnosis present

## 2019-01-15 DIAGNOSIS — I712 Thoracic aortic aneurysm, without rupture: Secondary | ICD-10-CM | POA: Diagnosis present

## 2019-01-15 DIAGNOSIS — Z91048 Other nonmedicinal substance allergy status: Secondary | ICD-10-CM | POA: Diagnosis not present

## 2019-01-15 DIAGNOSIS — C770 Secondary and unspecified malignant neoplasm of lymph nodes of head, face and neck: Secondary | ICD-10-CM | POA: Diagnosis not present

## 2019-01-15 DIAGNOSIS — N400 Enlarged prostate without lower urinary tract symptoms: Secondary | ICD-10-CM | POA: Diagnosis present

## 2019-01-15 DIAGNOSIS — I10 Essential (primary) hypertension: Secondary | ICD-10-CM | POA: Diagnosis present

## 2019-01-15 DIAGNOSIS — Z79899 Other long term (current) drug therapy: Secondary | ICD-10-CM | POA: Diagnosis not present

## 2019-01-15 DIAGNOSIS — Z881 Allergy status to other antibiotic agents status: Secondary | ICD-10-CM | POA: Diagnosis not present

## 2019-01-15 DIAGNOSIS — E7800 Pure hypercholesterolemia, unspecified: Secondary | ICD-10-CM | POA: Diagnosis present

## 2019-01-15 DIAGNOSIS — Z888 Allergy status to other drugs, medicaments and biological substances status: Secondary | ICD-10-CM

## 2019-01-15 DIAGNOSIS — Z886 Allergy status to analgesic agent status: Secondary | ICD-10-CM | POA: Diagnosis not present

## 2019-01-15 DIAGNOSIS — Z575 Occupational exposure to toxic agents in other industries: Secondary | ICD-10-CM | POA: Diagnosis not present

## 2019-01-15 DIAGNOSIS — Z9049 Acquired absence of other specified parts of digestive tract: Secondary | ICD-10-CM | POA: Diagnosis not present

## 2019-01-15 DIAGNOSIS — K5900 Constipation, unspecified: Secondary | ICD-10-CM | POA: Diagnosis not present

## 2019-01-15 DIAGNOSIS — C7989 Secondary malignant neoplasm of other specified sites: Secondary | ICD-10-CM | POA: Diagnosis not present

## 2019-01-15 DIAGNOSIS — I728 Aneurysm of other specified arteries: Secondary | ICD-10-CM | POA: Diagnosis present

## 2019-01-15 DIAGNOSIS — G629 Polyneuropathy, unspecified: Secondary | ICD-10-CM | POA: Diagnosis not present

## 2019-01-15 DIAGNOSIS — C799 Secondary malignant neoplasm of unspecified site: Secondary | ICD-10-CM | POA: Diagnosis not present

## 2019-01-15 DIAGNOSIS — I722 Aneurysm of renal artery: Secondary | ICD-10-CM | POA: Diagnosis present

## 2019-01-15 DIAGNOSIS — E785 Hyperlipidemia, unspecified: Secondary | ICD-10-CM | POA: Diagnosis not present

## 2019-01-15 HISTORY — PX: RADICAL NECK DISSECTION: SHX2284

## 2019-01-15 HISTORY — PX: THYROIDECTOMY: SHX17

## 2019-01-15 LAB — POCT I-STAT, CHEM 8
BUN: 17 mg/dL (ref 8–23)
Calcium, Ion: 1.19 mmol/L (ref 1.15–1.40)
Chloride: 105 mmol/L (ref 98–111)
Creatinine, Ser: 1.2 mg/dL (ref 0.61–1.24)
Glucose, Bld: 97 mg/dL (ref 70–99)
HCT: 36 % — ABNORMAL LOW (ref 39.0–52.0)
Hemoglobin: 12.2 g/dL — ABNORMAL LOW (ref 13.0–17.0)
Potassium: 4.1 mmol/L (ref 3.5–5.1)
Sodium: 141 mmol/L (ref 135–145)
TCO2: 24 mmol/L (ref 22–32)

## 2019-01-15 LAB — CALCIUM: Calcium: 8.9 mg/dL (ref 8.9–10.3)

## 2019-01-15 SURGERY — THYROIDECTOMY
Anesthesia: General | Site: Neck

## 2019-01-15 MED ORDER — FINASTERIDE 5 MG PO TABS
5.0000 mg | ORAL_TABLET | Freq: Every day | ORAL | Status: DC
  Administered 2019-01-15 – 2019-01-16 (×2): 5 mg via ORAL
  Filled 2019-01-15 (×2): qty 1

## 2019-01-15 MED ORDER — 0.9 % SODIUM CHLORIDE (POUR BTL) OPTIME
TOPICAL | Status: DC | PRN
  Administered 2019-01-15: 1000 mL

## 2019-01-15 MED ORDER — ONDANSETRON HCL 4 MG/2ML IJ SOLN
4.0000 mg | INTRAMUSCULAR | Status: DC | PRN
  Administered 2019-01-16 (×2): 4 mg via INTRAVENOUS
  Filled 2019-01-15 (×2): qty 2

## 2019-01-15 MED ORDER — SUCCINYLCHOLINE CHLORIDE 200 MG/10ML IV SOSY
PREFILLED_SYRINGE | INTRAVENOUS | Status: DC | PRN
  Administered 2019-01-15: 110 mg via INTRAVENOUS

## 2019-01-15 MED ORDER — VITAMIN D3 25 MCG (1000 UNIT) PO TABS
2000.0000 [IU] | ORAL_TABLET | Freq: Every day | ORAL | Status: DC
  Administered 2019-01-15 – 2019-01-16 (×2): 2000 [IU] via ORAL
  Filled 2019-01-15 (×5): qty 2

## 2019-01-15 MED ORDER — HYDROCODONE-ACETAMINOPHEN 5-325 MG PO TABS
1.0000 | ORAL_TABLET | ORAL | Status: DC | PRN
  Administered 2019-01-16 (×3): 1 via ORAL
  Administered 2019-01-16 – 2019-01-17 (×2): 2 via ORAL
  Administered 2019-01-17 (×2): 1 via ORAL
  Filled 2019-01-15 (×2): qty 1
  Filled 2019-01-15 (×2): qty 2
  Filled 2019-01-15 (×3): qty 1

## 2019-01-15 MED ORDER — BACITRACIN ZINC 500 UNIT/GM EX OINT
1.0000 "application " | TOPICAL_OINTMENT | Freq: Three times a day (TID) | CUTANEOUS | Status: DC
  Administered 2019-01-15 – 2019-01-17 (×5): 1 via TOPICAL
  Filled 2019-01-15: qty 28.35

## 2019-01-15 MED ORDER — LEVOTHYROXINE SODIUM 75 MCG PO TABS
150.0000 ug | ORAL_TABLET | Freq: Every day | ORAL | Status: DC
  Administered 2019-01-16 – 2019-01-17 (×2): 150 ug via ORAL
  Filled 2019-01-15 (×2): qty 2

## 2019-01-15 MED ORDER — KCL IN DEXTROSE-NACL 20-5-0.45 MEQ/L-%-% IV SOLN
INTRAVENOUS | Status: DC
  Administered 2019-01-15 – 2019-01-17 (×3): via INTRAVENOUS
  Filled 2019-01-15 (×4): qty 1000

## 2019-01-15 MED ORDER — FENTANYL CITRATE (PF) 250 MCG/5ML IJ SOLN
INTRAMUSCULAR | Status: AC
  Filled 2019-01-15: qty 5

## 2019-01-15 MED ORDER — ROCURONIUM BROMIDE 10 MG/ML (PF) SYRINGE
PREFILLED_SYRINGE | INTRAVENOUS | Status: AC
  Filled 2019-01-15: qty 10

## 2019-01-15 MED ORDER — EZETIMIBE 10 MG PO TABS
10.0000 mg | ORAL_TABLET | Freq: Every day | ORAL | Status: DC
  Administered 2019-01-15 – 2019-01-16 (×2): 10 mg via ORAL
  Filled 2019-01-15 (×2): qty 1

## 2019-01-15 MED ORDER — ACYCLOVIR 400 MG PO TABS
400.0000 mg | ORAL_TABLET | Freq: Every day | ORAL | Status: DC
  Administered 2019-01-16 – 2019-01-17 (×2): 400 mg via ORAL
  Filled 2019-01-15 (×2): qty 1

## 2019-01-15 MED ORDER — MORPHINE SULFATE (PF) 2 MG/ML IV SOLN
2.0000 mg | INTRAVENOUS | Status: DC | PRN
  Administered 2019-01-15: 2 mg via INTRAVENOUS
  Administered 2019-01-16: 4 mg via INTRAVENOUS
  Filled 2019-01-15 (×2): qty 1
  Filled 2019-01-15: qty 2

## 2019-01-15 MED ORDER — ONDANSETRON HCL 4 MG/2ML IJ SOLN
INTRAMUSCULAR | Status: AC
  Filled 2019-01-15: qty 2

## 2019-01-15 MED ORDER — SODIUM CHLORIDE 0.9 % IV SOLN: INTRAVENOUS | Status: DC | PRN

## 2019-01-15 MED ORDER — CHLORHEXIDINE GLUCONATE CLOTH 2 % EX PADS: 6.0000 | MEDICATED_PAD | Freq: Once | CUTANEOUS | Status: DC

## 2019-01-15 MED ORDER — BACITRACIN ZINC 500 UNIT/GM EX OINT
TOPICAL_OINTMENT | CUTANEOUS | Status: DC | PRN
  Administered 2019-01-15: 1 via TOPICAL

## 2019-01-15 MED ORDER — LACTATED RINGERS IV SOLN
INTRAVENOUS | Status: DC
  Administered 2019-01-15: 09:00:00 via INTRAVENOUS

## 2019-01-15 MED ORDER — PROMETHAZINE HCL 25 MG/ML IJ SOLN: 6.2500 mg | INTRAMUSCULAR | Status: DC | PRN

## 2019-01-15 MED ORDER — LIDOCAINE 2% (20 MG/ML) 5 ML SYRINGE
INTRAMUSCULAR | Status: AC
  Filled 2019-01-15: qty 5

## 2019-01-15 MED ORDER — FENTANYL CITRATE (PF) 100 MCG/2ML IJ SOLN
25.0000 ug | INTRAMUSCULAR | Status: DC | PRN
  Administered 2019-01-15 (×2): 50 ug via INTRAVENOUS

## 2019-01-15 MED ORDER — POLYETHYL GLYC-PROPYL GLYC PF 0.4-0.3 % OP SOLN: 1.0000 [drp] | Freq: Three times a day (TID) | OPHTHALMIC | Status: DC | PRN

## 2019-01-15 MED ORDER — SODIUM CHLORIDE (PF) 0.9 % IJ SOLN
INTRAMUSCULAR | Status: AC
  Filled 2019-01-15: qty 10

## 2019-01-15 MED ORDER — ALFUZOSIN HCL ER 10 MG PO TB24
10.0000 mg | ORAL_TABLET | Freq: Every day | ORAL | Status: DC
  Administered 2019-01-15 – 2019-01-16 (×2): 10 mg via ORAL
  Filled 2019-01-15 (×3): qty 1

## 2019-01-15 MED ORDER — DEXAMETHASONE SODIUM PHOSPHATE 10 MG/ML IJ SOLN
INTRAMUSCULAR | Status: AC
  Filled 2019-01-15: qty 1

## 2019-01-15 MED ORDER — METOPROLOL SUCCINATE ER 25 MG PO TB24
12.5000 mg | ORAL_TABLET | Freq: Every day | ORAL | Status: DC
  Administered 2019-01-16 – 2019-01-17 (×2): 12.5 mg via ORAL
  Filled 2019-01-15 (×2): qty 1

## 2019-01-15 MED ORDER — ACETAMINOPHEN 500 MG PO TABS
1000.0000 mg | ORAL_TABLET | Freq: Once | ORAL | Status: AC
  Administered 2019-01-15: 10:00:00 1000 mg via ORAL

## 2019-01-15 MED ORDER — LIDOCAINE-EPINEPHRINE 1 %-1:100000 IJ SOLN
INTRAMUSCULAR | Status: DC | PRN
  Administered 2019-01-15: 9 mL

## 2019-01-15 MED ORDER — FENTANYL CITRATE (PF) 250 MCG/5ML IJ SOLN
INTRAMUSCULAR | Status: DC | PRN
  Administered 2019-01-15: 50 ug via INTRAVENOUS
  Administered 2019-01-15: 100 ug via INTRAVENOUS

## 2019-01-15 MED ORDER — LIDOCAINE-EPINEPHRINE 1 %-1:100000 IJ SOLN
INTRAMUSCULAR | Status: AC
  Filled 2019-01-15: qty 1

## 2019-01-15 MED ORDER — SODIUM CHLORIDE 0.9 % IV SOLN
INTRAVENOUS | Status: DC | PRN
  Administered 2019-01-15: 20 ug/min via INTRAVENOUS

## 2019-01-15 MED ORDER — ONDANSETRON HCL 4 MG PO TABS: 4.0000 mg | ORAL_TABLET | ORAL | Status: DC | PRN

## 2019-01-15 MED ORDER — BUPROPION HCL ER (XL) 150 MG PO TB24
150.0000 mg | ORAL_TABLET | Freq: Every day | ORAL | Status: DC
  Administered 2019-01-16 – 2019-01-17 (×2): 150 mg via ORAL
  Filled 2019-01-15 (×2): qty 1

## 2019-01-15 MED ORDER — ONDANSETRON HCL 4 MG/2ML IJ SOLN
INTRAMUSCULAR | Status: DC | PRN
  Administered 2019-01-15: 4 mg via INTRAVENOUS

## 2019-01-15 MED ORDER — STERILE WATER FOR IRRIGATION IR SOLN
Status: DC | PRN
  Administered 2019-01-15: 1000 mL

## 2019-01-15 MED ORDER — SODIUM CHLORIDE 0.9 % IV SOLN
0.0500 ug/kg/min | INTRAVENOUS | Status: AC
  Administered 2019-01-15: 0.1 ug/kg/min via INTRAVENOUS
  Filled 2019-01-15: qty 1000

## 2019-01-15 MED ORDER — LIDOCAINE 2% (20 MG/ML) 5 ML SYRINGE
INTRAMUSCULAR | Status: DC | PRN
  Administered 2019-01-15: 60 mg via INTRAVENOUS

## 2019-01-15 MED ORDER — CEFAZOLIN SODIUM-DEXTROSE 2-4 GM/100ML-% IV SOLN
INTRAVENOUS | Status: AC
  Filled 2019-01-15: qty 100

## 2019-01-15 MED ORDER — POLYVINYL ALCOHOL 1.4 % OP SOLN
1.0000 [drp] | Freq: Three times a day (TID) | OPHTHALMIC | Status: DC | PRN
  Filled 2019-01-15: qty 15

## 2019-01-15 MED ORDER — ACETAMINOPHEN 500 MG PO TABS
ORAL_TABLET | ORAL | Status: AC
  Administered 2019-01-15: 1000 mg via ORAL
  Filled 2019-01-15: qty 2

## 2019-01-15 MED ORDER — CEFAZOLIN SODIUM-DEXTROSE 1-4 GM/50ML-% IV SOLN
1.0000 g | Freq: Three times a day (TID) | INTRAVENOUS | Status: AC
  Administered 2019-01-15 – 2019-01-16 (×3): 1 g via INTRAVENOUS
  Filled 2019-01-15 (×3): qty 50

## 2019-01-15 MED ORDER — PROPOFOL 10 MG/ML IV BOLUS
INTRAVENOUS | Status: DC | PRN
  Administered 2019-01-15: 50 mg via INTRAVENOUS
  Administered 2019-01-15: 150 mg via INTRAVENOUS

## 2019-01-15 MED ORDER — DEXAMETHASONE SODIUM PHOSPHATE 10 MG/ML IJ SOLN
INTRAMUSCULAR | Status: DC | PRN
  Administered 2019-01-15: 10 mg via INTRAVENOUS

## 2019-01-15 MED ORDER — HEMOSTATIC AGENTS (NO CHARGE) OPTIME
TOPICAL | Status: DC | PRN
  Administered 2019-01-15: 1 via TOPICAL

## 2019-01-15 MED ORDER — LACTATED RINGERS IV SOLN
INTRAVENOUS | Status: DC | PRN
  Administered 2019-01-15 (×2): via INTRAVENOUS

## 2019-01-15 MED ORDER — ACETAMINOPHEN 500 MG PO TABS: 1000.0000 mg | ORAL_TABLET | Freq: Every day | ORAL | Status: DC | PRN

## 2019-01-15 MED ORDER — CEFAZOLIN SODIUM 1 G IJ SOLR
INTRAMUSCULAR | Status: AC
  Filled 2019-01-15: qty 20

## 2019-01-15 MED ORDER — CEFAZOLIN SODIUM-DEXTROSE 2-4 GM/100ML-% IV SOLN
2.0000 g | INTRAVENOUS | Status: AC
  Administered 2019-01-15 (×2): 2 g via INTRAVENOUS

## 2019-01-15 MED ORDER — FENTANYL CITRATE (PF) 100 MCG/2ML IJ SOLN
INTRAMUSCULAR | Status: AC
  Administered 2019-01-15: 50 ug via INTRAVENOUS
  Filled 2019-01-15: qty 2

## 2019-01-15 MED ORDER — PROPOFOL 10 MG/ML IV BOLUS
INTRAVENOUS | Status: AC
  Filled 2019-01-15: qty 20

## 2019-01-15 SURGICAL SUPPLY — 63 items
ADH SKN CLS APL DERMABOND .7 (GAUZE/BANDAGES/DRESSINGS) ×2
APPLIER CLIP 9.375 SM OPEN (CLIP)
APR CLP SM 9.3 20 MLT OPN (CLIP)
ATTRACTOMAT 16X20 MAGNETIC DRP (DRAPES) IMPLANT
BLADE SURG 15 STRL LF DISP TIS (BLADE) IMPLANT
BLADE SURG 15 STRL SS (BLADE)
CANISTER SUCT 3000ML PPV (MISCELLANEOUS) ×3 IMPLANT
CLEANER TIP ELECTROSURG 2X2 (MISCELLANEOUS) ×3 IMPLANT
CLIP APPLIE 9.375 SM OPEN (CLIP) IMPLANT
CONT SPEC 4OZ CLIKSEAL STRL BL (MISCELLANEOUS) ×3 IMPLANT
CORD BIPOLAR FORCEPS 12FT (ELECTRODE) ×3 IMPLANT
COVER SURGICAL LIGHT HANDLE (MISCELLANEOUS) ×3 IMPLANT
COVER WAND RF STERILE (DRAPES) ×2 IMPLANT
DERMABOND ADVANCED (GAUZE/BANDAGES/DRESSINGS) ×1
DERMABOND ADVANCED .7 DNX12 (GAUZE/BANDAGES/DRESSINGS) ×2 IMPLANT
DRAIN JACKSON RD 7FR 3/32 (WOUND CARE) IMPLANT
DRAIN SNY 10 ROU (WOUND CARE) ×1 IMPLANT
DRAIN WOUND SNY 15 RND (WOUND CARE) IMPLANT
DRAPE HALF SHEET 40X57 (DRAPES) IMPLANT
ELECT COATED BLADE 2.86 ST (ELECTRODE) ×3 IMPLANT
ELECT REM PT RETURN 9FT ADLT (ELECTROSURGICAL) ×3
ELECTRODE REM PT RTRN 9FT ADLT (ELECTROSURGICAL) ×2 IMPLANT
EVACUATOR SILICONE 100CC (DRAIN) ×3 IMPLANT
FORCEPS BIPOLAR SPETZLER 8 1.0 (NEUROSURGERY SUPPLIES) ×3 IMPLANT
GAUZE 4X4 16PLY RFD (DISPOSABLE) ×5 IMPLANT
GLOVE BIO SURGEON STRL SZ7.5 (GLOVE) ×3 IMPLANT
GOWN STRL REUS W/ TWL LRG LVL3 (GOWN DISPOSABLE) ×4 IMPLANT
GOWN STRL REUS W/TWL LRG LVL3 (GOWN DISPOSABLE) ×9
HEMOSTAT SURGICEL 2X14 (HEMOSTASIS) ×3 IMPLANT
KIT BASIN OR (CUSTOM PROCEDURE TRAY) ×3 IMPLANT
KIT TURNOVER KIT B (KITS) ×3 IMPLANT
LOCATOR NERVE 3 VOLT (DISPOSABLE) IMPLANT
NDL HYPO 25GX1X1/2 BEV (NEEDLE) ×2 IMPLANT
NEEDLE HYPO 25GX1X1/2 BEV (NEEDLE) ×3 IMPLANT
NS IRRIG 1000ML POUR BTL (IV SOLUTION) ×3 IMPLANT
PAD ARMBOARD 7.5X6 YLW CONV (MISCELLANEOUS) ×6 IMPLANT
PENCIL BUTTON HOLSTER BLD 10FT (ELECTRODE) ×3 IMPLANT
POSITIONER HEAD DONUT 9IN (MISCELLANEOUS) IMPLANT
PROBE NERVBE PRASS .33 (MISCELLANEOUS) ×1 IMPLANT
SET WALTER ACTIVATION W/DRAPE (SET/KITS/TRAYS/PACK) ×1 IMPLANT
SHEARS HARMONIC 9CM CVD (BLADE) ×3 IMPLANT
SPECIMEN JAR MEDIUM (MISCELLANEOUS) ×3 IMPLANT
SPONGE INTESTINAL PEANUT (DISPOSABLE) IMPLANT
SPONGE LAP 18X18 RF (DISPOSABLE) ×3 IMPLANT
STAPLER VISISTAT 35W (STAPLE) ×3 IMPLANT
SUT ETHILON 2 0 FS 18 (SUTURE) ×4 IMPLANT
SUT ETHILON 3 0 PS 1 (SUTURE) ×3 IMPLANT
SUT SILK 2 0 SH CR/8 (SUTURE) ×3 IMPLANT
SUT SILK 3 0 REEL (SUTURE) ×4 IMPLANT
SUT SILK 3 0 SH 30 (SUTURE) ×2 IMPLANT
SUT SILK 3 0SH CR/8 30 (SUTURE) ×1 IMPLANT
SUT SILK 4 0 REEL (SUTURE) ×3 IMPLANT
SUT VIC AB 3-0 SH 27 (SUTURE) ×9
SUT VIC AB 3-0 SH 27X BRD (SUTURE) ×2 IMPLANT
SUT VICRYL 3 0 (SUTURE) IMPLANT
SUT VICRYL 4-0 PS2 18IN ABS (SUTURE) ×6 IMPLANT
TOWEL GREEN STERILE FF (TOWEL DISPOSABLE) ×3 IMPLANT
TRAY ENT MC OR (CUSTOM PROCEDURE TRAY) ×3 IMPLANT
TRAY FOLEY MTR SLVR 14FR STAT (SET/KITS/TRAYS/PACK) ×1 IMPLANT
TUBE FEEDING 10FR FLEXIFLO (MISCELLANEOUS) IMPLANT
UNDERPAD 30X30 (UNDERPADS AND DIAPERS) ×2 IMPLANT
WATER STERILE IRR 1000ML POUR (IV SOLUTION) ×3 IMPLANT
YANKAUER SUCT BULB TIP NO VENT (SUCTIONS) ×1 IMPLANT

## 2019-01-15 NOTE — Transfer of Care (Signed)
Immediate Anesthesia Transfer of Care Note  Patient: Sean Stanley  Procedure(s) Performed: TOTAL THYROIDECTOMY (N/A Neck) LEFT NECK DISSECTION (Left Neck)  Patient Location: PACU  Anesthesia Type:General  Level of Consciousness: awake, alert  and oriented  Airway & Oxygen Therapy: Patient Spontanous Breathing and Patient connected to face mask oxygen  Post-op Assessment: Report given to RN and Post -op Vital signs reviewed and stable  Post vital signs: Reviewed and stable  Last Vitals:  Vitals Value Taken Time  BP 156/86 01/15/19 1553  Temp 36.1 C 01/15/19 1553  Pulse 72 01/15/19 1556  Resp 12 01/15/19 1556  SpO2 100 % 01/15/19 1556  Vitals shown include unvalidated device data.  Last Pain:  Vitals:   01/15/19 0906  TempSrc:   PainSc: 0-No pain         Complications: No apparent anesthesia complications

## 2019-01-15 NOTE — Anesthesia Procedure Notes (Signed)
Procedure Name: Intubation Date/Time: 01/15/2019 11:04 AM Performed by: Harden Mo, CRNA Pre-anesthesia Checklist: Patient identified, Emergency Drugs available, Suction available and Patient being monitored Patient Re-evaluated:Patient Re-evaluated prior to induction Oxygen Delivery Method: Circle System Utilized Preoxygenation: Pre-oxygenation with 100% oxygen Induction Type: IV induction Ventilation: Mask ventilation without difficulty Laryngoscope Size: Glidescope and 4 Grade View: Grade I Tube type: Oral (NIMs tube) Tube size: 8.0 mm Number of attempts: 1 Airway Equipment and Method: Stylet and Oral airway Placement Confirmation: ETT inserted through vocal cords under direct vision,  positive ETCO2 and breath sounds checked- equal and bilateral Tube secured with: Tape Dental Injury: Teeth and Oropharynx as per pre-operative assessment

## 2019-01-15 NOTE — Progress Notes (Signed)
Patient ID: Sean Stanley, male   DOB: 1940/08/01, 78 y.o.   MRN: [id number] Evening rounds, seen in PACU.  He is awake and alert and able to make conversation.  His voice is hoarse and breathy as expected.  The incision looks excellent.  The drain is charged and holding a seal.  Stable postop.  Continue hospital care.

## 2019-01-15 NOTE — H&P (Signed)
Sean Stanley is an 78 y.o. male.   Chief Complaint: Papillary thyroid cancer HPI: 78 year old male was recently treated for mantle cell lymphoma but had a persistent active node in the left neck on PET scan after treatment.  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  He now presents for completion neck dissection and total thyroidectomy.  Past Medical History:  Diagnosis Date  . 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 Atrial flutter ([place])   . BPH (benign prostatic hyperplasia)   . Cancer ([place])    melanoma on lower right back and left chest - surgically removed and cleared  . Dysrhythmia   . 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 Headache    migraine- not current  . 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 History of blood transfusion   . Hypercholesteremia   . Hypercholesterolemia   . Lymphoma ([place]) 04/2018  . Mitral valve disease    annuloplasty 2002 [facility]  . Neuropathy   . Neuropathy of both feet    pt states due to exposure to [employer]  . OSA (obstructive sleep apnea)    does not use cpap, Dr. Maxwell Caul told him he had improved  . Pneumonia   . Thoracic ascending aortic aneurysm ([facility])    4.5 cm 03/2018 CT  . Thyroid cancer [facility])     Past Surgical History:  Procedure Laterality Date  . ABDOMINAL ANGIOGRAM  08/05/12  . aneurysm repair    . CARDIOVERSION  02/12/2012   Procedure: CARDIOVERSION;  Surgeon: Pixie Casino, MD;  Location: [facility] ENDOSCOPY;  Service: Cardiovascular;  Laterality: N/A;  . CARDIOVERSION N/A 06/22/2013   Procedure: CARDIOVERSION;   Surgeon: [name], MD;  Location: [place];  Service: Cardiovascular;  Laterality: N/A;  . CATARACT EXTRACTION Bilateral 2018   with lens implant  . CHOLECYSTECTOMY    . COLONOSCOPY    . ESOPHAGOGASTRODUODENOSCOPY    . IR IMAGING GUIDED PORT INSERTION  05/26/2018  . LYMPH NODE BIOPSY Left 04/27/2018   Procedure: EXCISIONAL BIOPSY OF LEFT CERVICAL LYMPH NODE;  Surgeon: Melida Quitter, MD;  Location: [place];  Service: ENT;  Laterality: Left;  . LYMPH NODE BIOPSY Left 11/23/2018   Procedure: LEFT CERVICAL LYMPH NODE OPEN BIOPSY;  Surgeon: Melida Quitter, MD;  Location: [place];  Service: ENT;  Laterality: Left;  . MENISCUS REPAIR Right 2009  . MITRAL VALVE REPAIR  2002   [facility]  . NM MYOVIEW LTD  07/22/2006   no ischemia  . RIGHT HEART CATH  06/19/2004   normal right heart dynamics. EF 50%  . TEE WITHOUT CARDIOVERSION  02/12/2012   Procedure: TRANSESOPHAGEAL ECHOCARDIOGRAM (TEE);  Surgeon: Pixie Casino, MD;  Location: [facility] ENDOSCOPY;  Service: Cardiovascular;  Laterality: N/A;  . TEE WITHOUT CARDIOVERSION N/A 06/22/2013   Procedure: TRANSESOPHAGEAL ECHOCARDIOGRAM (TEE);  Surgeon: [name], MD;  Location: [facility] ENDOSCOPY;  Service: Cardiovascular;  Laterality: N/A;    Family History  Problem Relation Age of Onset  . Parkinson's disease Mother 78  .  Heart failure Father 46  . Cancer Maternal Grandmother   . Heart attack Maternal Grandfather   . Heart attack Paternal Grandmother   . Heart attack Paternal Grandfather    Social History:  reports that he has never smoked. He has never used smokeless tobacco. He reports previous alcohol use. He reports that he does not use drugs.  Allergies:  Allergies  Allergen Reactions  . Aspirin     Has been instructed not to take any blood thinners even aspirin due to history of several aneurysms   . Levaquin [Levofloxacin In D5w]     tendonitis  . Nickel Itching  . Other     No blood thinners due to history of several aneurysms   .  Statins Other (See Comments)    Muscle aches  . Allopurinol Rash  . Ampicillin Rash    Did it involve swelling of the face/tongue/throat, SOB, or low BP? No Did it involve sudden or severe rash/hives, skin peeling, or any reaction on the inside of your mouth or nose? Yes Did you need to seek medical attention at a hospital or doctor's office? Yes When did it last happen?50+ years If all above answers are "NO", may proceed with cephalosporin use.     Medications Prior to Admission  Medication Sig Dispense Refill  . acetaminophen (TYLENOL) 500 MG tablet Take 1,000 mg by mouth daily as needed for moderate pain.    Marland Kitchen acyclovir (ZOVIRAX) 400 MG tablet Take 1 tablet (400 mg total) by mouth daily. (Patient taking differently: Take 400 mg by mouth daily at 12 noon. ) 90 tablet 3  . alfuzosin (UROXATRAL) 10 MG 24 hr tablet Take 10 mg by mouth at bedtime.     Marland Kitchen buPROPion (WELLBUTRIN XL) 150 MG 24 hr tablet TAKE 1 TABLET DAILY (Patient taking differently: Take 150 mg by mouth daily. ) 90 tablet 3  . Cholecalciferol (VITAMIN D) 2000 units tablet Take 2,000 Units by mouth at bedtime.     Marland Kitchen ezetimibe (ZETIA) 10 MG tablet Take 10 mg by mouth at bedtime.    . finasteride (PROSCAR) 5 MG tablet Take 5 mg by mouth at bedtime.     . metoprolol succinate (TOPROL-XL) 25 MG 24 hr tablet Take 12.5 mg by mouth daily.     Vladimir Faster Glycol-Propyl Glycol (SYSTANE HYDRATION PF OP) Place 1 drop into both eyes 3 (three) times daily as needed (dry eyes).     Marland Kitchen lidocaine-prilocaine (EMLA) cream Apply to affected area once (Patient not taking: Reported on 01/07/2019) 30 g 3    Results for orders placed or performed during the hospital encounter of 01/15/19 (from the past 48 hour(s))  I-STAT, chem 8     Status: Abnormal   Collection Time: 01/15/19  9:19 AM  Result Value Ref Range   Sodium 141 135 - 145 mmol/L   Potassium 4.1 3.5 - 5.1 mmol/L   Chloride 105 98 - 111 mmol/L   BUN 17 8 - 23 mg/dL   Creatinine,  Ser 1.20 0.61 - 1.24 mg/dL   Glucose, Bld 97 70 - 99 mg/dL   Calcium, Ion 1.19 1.15 - 1.40 mmol/L   TCO2 24 22 - 32 mmol/L   Hemoglobin 12.2 (L) 13.0 - 17.0 g/dL   HCT 36.0 (L) 39.0 - 52.0 %   No results found.  Review of Systems  All other systems reviewed and are negative.   Blood pressure (!) 158/87, pulse 67, temperature 97.7 F (36.5 C), temperature source Oral,  resp. rate 20, height 6\' 1"  (1.854 m), weight 101.3 kg, SpO2 100 %. Physical Exam  Constitutional: He is oriented to person, place, and time. He appears well-developed and well-nourished. No distress.  HENT:  Head: Normocephalic and atraumatic.  Right Ear: External ear normal.  Left Ear: External ear normal.  Nose: Nose normal.  Mouth/Throat: Oropharynx is clear and moist.  Eyes: Pupils are equal, round, and reactive to light. Conjunctivae and EOM are normal.  Neck: Normal range of motion. Neck supple.  Left neck healing incision scar.  Cardiovascular: Normal rate.  Respiratory: Effort normal.  Neurological: He is alert and oriented to person, place, and time. No cranial nerve deficit.  Skin: Skin is warm and dry.  Psychiatric: He has a normal mood and affect. His behavior is normal. Judgment and thought content normal.     Assessment/Plan Papillary thyroid cancer  To OR for left neck dissection and total thyroidectomy.  Anticipate 2-3 days in hospital.  Melida Quitter, MD 01/15/2019, 10:42 AM

## 2019-01-15 NOTE — Brief Op Note (Signed)
01/15/2019  3:52 PM  PATIENT:  Sean Stanley  78 y.o. 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  PROCEDURE:  Procedure(s): TOTAL THYROIDECTOMY (N/A) LEFT NECK DISSECTION (Left)  SURGEON:  Surgeon(s) and Role:    * Melida Quitter, MD - Primary    * Marcellino, San Jetty, MD - Assisting  PHYSICIAN ASSISTANT:   ASSISTANTS: Marcellino   ANESTHESIA:   general  EBL:  150 mL   BLOOD ADMINISTERED:none  DRAINS: (10 Fr) Jackson-Pratt drain(s) with closed bulb suction in the left neck   LOCAL MEDICATIONS USED:  LIDOCAINE   SPECIMEN:  Source of Specimen:  Left neck contents zones 2-4, total thyroid, zone 6 tissue, left recurrent laryngeal nerve with surrounding tissue  DISPOSITION OF SPECIMEN:  PATHOLOGY  COUNTS:  YES  TOURNIQUET:  * No tourniquets in log *  DICTATION: .Other Dictation: Dictation Number [id number]  PLAN OF CARE: Admit to inpatient   PATIENT DISPOSITION:  PACU - hemodynamically stable.   Delay start of Pharmacological VTE agent (>24hrs) due to surgical blood loss or risk of bleeding: yes

## 2019-01-16 LAB — CALCIUM
Calcium: 8.7 mg/dL — ABNORMAL LOW (ref 8.9–10.3)
Calcium: 8.5 mg/dL — ABNORMAL LOW (ref 8.9–10.3)
Calcium: 8.2 mg/dL — ABNORMAL LOW (ref 8.9–10.3)

## 2019-01-16 MED ORDER — MAGNESIUM HYDROXIDE 400 MG/5ML PO SUSP
30.0000 mL | Freq: Every day | ORAL | Status: DC | PRN
  Administered 2019-01-16 – 2019-01-17 (×2): 30 mL via ORAL
  Filled 2019-01-16 (×2): qty 30

## 2019-01-16 NOTE — Progress Notes (Signed)
Patient ID: Sean Stanley, male   DOB: [birth year], 78 y.o.   MRN: [id number] Subjective: He had a lot of pain and nausea and vomiting last night but is now starting to feel better.  He has been able to drink some liquids without any trouble with aspiration.  He is having some discomfort with voiding.  He has not had a bowel movement and typically has difficulty with moving his bowels after surgery.  Objective: Vital signs in last 24 hours: Temp:  [97 F (36.1 C)-98.3 F (36.8 C)] 97.6 F (36.4 C) (10/24 1005) Pulse Rate:  [63-77] 77 (10/24 1005) Resp:  [12-20] 16 (10/24 1005) BP: (92-156)/(52-91) 92/88 (10/24 1005) SpO2:  [92 %-98 %] 95 % (10/24 1005) Weight change:  Last BM Date: 01/15/19  Intake/Output from previous day: 10/23 0701 - 10/24 0700 In: 2771.7 [P.O.:240; I.V.:2431.7; IV Piggyback:50] Out: 1955 [Urine:1725; Drains:80; Blood:150] Intake/Output this shift: Total I/O In: 150 [P.O.:150] Out: 150 [Urine:150]  PHYSICAL EXAM: He is awake and alert.  His voice is still breathy.  His neck looks excellent.  The drain is functioning with serous material.  Lab Results: Recent Labs    01/15/19 0919  HGB 12.2*  HCT 36.0*   BMET Recent Labs    01/15/19 0919 01/15/19 1843 01/16/19 0227  NA 141  --   --   K 4.1  --   --   CL 105  --   --   GLUCOSE 97  --   --   BUN 17  --   --   CREATININE 1.20  --   --   CALCIUM  --  8.9 8.7*    Studies/Results: No results found.  Medications: I have reviewed the patient's current medications.  Assessment/Plan: Postop day 1, progressing well.  Continue to monitor drain output.  He does not want to go home with the drain.  He will continue attempting advancing his diet today as his throat starts to feel better.  We will give him something for constipation.  Continue hospital care.  LOS: 1 day   [name] 01/16/2019, 10:39 AM

## 2019-01-16 NOTE — Op Note (Signed)
NAME: LATRON, BINDA MEDICAL RECORD [id number] ACCOUNT [id number] DATE OF BIRTH:09-16-1940 FACILITY: [facility] LOCATION: [facility] PHYSICIAN:Elidia Bonenfant D. 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  PROCEDURE:  Total thyroidectomy and left modified radical neck dissection.  SURGEON:  Melida Quitter, MD  ASSISTANT:  Rayvon Char, MD, who was necessary for retraction and surgical decision making.  ANESTHESIA:  General endotracheal anesthesia.  COMPLICATIONS:  None.  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  After treatment, he had a post-treatment PET scan that still demonstrated activity in the left neck,  so a repeat nodal biopsy demonstrated papillary carcinoma of the thyroid in the left neck.  Now, he presents for thyroidectomy and neck dissection.  FINDINGS:  There was a firm nodule in zone IIB on the left side and also some firm tissue and adherence to the jugular vein in zone III region requiring removal of a portion of the vein.  There was a firm nodule in the left thyroid lobe that extended  posterior to the gland, adhering to the trachea, and encompassing the recurrent laryngeal nerve.  Tissue was sent for frozen section from adjacent to the nerve, demonstrating papillary carcinoma.  The nerve stimulated distal to the mass but not proximal  to the mass; thus, the nerve was removed with the mass.  The left-sided parathyroid glands were visualized, as was the right superior parathyroid gland.  The right recurrent nerve was kept safe and stimulated well at the end of the case.  DESCRIPTION OF PROCEDURE:  The patient was identified in the holding room, informed consent having been  obtained with discussion of risks, benefits, and alternatives.  The patient was brought to the operative suite and put on the table in supine  position.  Anesthesia was induced, and the patient was intubated by the anesthesia team without difficulty.  The patient was given intravenous antibiotics during the case.  The eyes were taped closed and the nerve integrity monitor endotracheal tube was  utilized and monitor turned on during the case.  The neck incision was marked with a marking pen including a thyroidectomy incision and connected to the left neck dissection incision.  This was injected with 1% lidocaine with 1:100,000 epinephrine.  The  neck was prepped and draped in sterile fashion.  Incision was then made with a 15-blade scalpel through the skin and extended through subcutaneous and platysmal layer using Bovie electrocautery.  Subplatysmal flaps were elevated superiorly and inferiorly  and secured with stay sutures.  The sternocleidomastoid muscle was then skeletonized and dissected around its medial surface down to the level of the cervical plexus.  The tissues were then elevated out of the zone III and IV over the cervical plexus to  the great vessels.  This was also done superiorly in zone II where the spinal accessory nerve was identified and kept intact.  There was a firm nodule noted deep and superior to the spinal accessory nerve, which was then dissected free along with the  zone IIIB and then retracted inferiorly while dissecting under the nerve.  The omohyoid muscle was divided inferiorly, and the great vessels were then skeletonized up to where the tissues were adherent to the jugular vein in the zone III region.  Inferiorly, the thoracic duct was identified and divided and ligated from the base of the jugular vein.  The segment of vein that was attached to the tissues was then resected by ligating and dividing the vein superior and inferior to this location.  The  tissues were then  further elevated off of the vessels and removed and passed to nursing for pathology with suture marking zone II.  At this point, the midline raphe of the strap muscles were divided and retracted to either side.  The right thyroid lobe  was skeletonized.  The superior pedicle was able to be dissected and divided, and tissues were then dissected along the lateral and inferior margin of the gland.  The superior parathyroid gland was divided from the thyroid gland.  The inferior was not  visualized.  Staying along the capsule of the gland, the gland was rotated medially and further dissection performed.  The recurrent laryngeal nerve was identified and kept intact as the gland was able to be elevated from that region and onto the trachea  where Berry's ligament was divided.  At this point, the left side gland was skeletonized, and once again the superior pedicle divided and dissection performed around the periphery of the gland.  The superior and inferior parathyroid glands were  identified and freed from the thyroid gland.  Further dissection was performed around the periphery of the nerve inferiorly and laterally, and a firm mass was identified extending posteriorly from the gland.  The recurrent laryngeal nerve was identified,  and initially the thyroid lobe was able to be divided from firm tissue at the nerve.  It was then rotated medially and Berry's ligament divided until the gland was removed.  This was marked with a suture marking the left lobe and passed to nursing for  pathology.  At this point, the tissue adjacent to the recurrent nerve was attempted to be dissected, but the nerve was found to be incorporated in the tissue.  A segment of tissue next to the nerve was removed with scissors and passed to pathology as a  frozen section, later returning as papillary carcinoma.  The nerve did stimulate with the nerve stimulator distal to the mass, but did not stimulate proximal to the mass.  With those facts  and the results on pathology, the nerve was then divided superior  and inferior to the mass and ligated.  The mass was then removed as a separate specimen and passed to nursing for pathology.  Some of the inferior zone VI tissue was then removed using Bovie electrocautery and passed as a separate specimen.  At this  point, the wounds were copiously irrigated with saline.  A little bit of Surgicel was placed in the thyroid bed on each side.  Bleeding was controlled with bipolar electrocautery.  A 10-French suction drain was then placed in the depths of the wound in  the thyroid bed, as well as into the left neck dissection and secured at the superior extent of the incision using a 2-0 nylon suture and standard drain stitch.  The flaps were laid back down, and the platysmal layer was closed with 3-0 Vicryl suture in  a simple running fashion.  The skin was closed with staples.  The patient was cleaned off and drapes were removed.  Bacitracin ointment was added to the incision.  His drain was taped to his left shoulder.  He was then returned to anesthesia for wakeup  and was extubated and taken to the recovery room  in stable condition.  LN/NUANCE  D:01/15/2019 T:01/16/2019 JOB:[id number]

## 2019-01-16 NOTE — Anesthesia Postprocedure Evaluation (Signed)
Anesthesia Post Note  Patient: Uziah A Hamill  Procedure(s) Performed: TOTAL THYROIDECTOMY (N/A Neck) LEFT NECK DISSECTION (Left Neck)     Patient location during evaluation: PACU Anesthesia Type: General Level of consciousness: awake and alert Pain management: pain level controlled Vital Signs Assessment: post-procedure vital signs reviewed and stable Respiratory status: spontaneous breathing, nonlabored ventilation, respiratory function stable and patient connected to nasal cannula oxygen Cardiovascular status: blood pressure returned to baseline and stable Postop Assessment: no apparent nausea or vomiting Anesthetic complications: no    Last Vitals:  Vitals:   01/16/19 1005 01/16/19 1452  BP: 92/88 119/61  Pulse: 77 72  Resp: 16 16  Temp: 36.4 C 36.6 C  SpO2: 95% 96%    Last Pain:  Vitals:   01/16/19 1452  TempSrc: Oral  PainSc:                  Tiajuana Amass

## 2019-01-17 ENCOUNTER — Encounter (HOSPITAL_COMMUNITY): Payer: Self-pay | Admitting: Otolaryngology

## 2019-01-17 LAB — CALCIUM: Calcium: 7.8 mg/dL — ABNORMAL LOW (ref 8.9–10.3)

## 2019-01-17 MED ORDER — HYDROCODONE-ACETAMINOPHEN 7.5-325 MG PO TABS: 1.0000 | ORAL_TABLET | Freq: Four times a day (QID) | ORAL | 0 refills | Status: DC | PRN

## 2019-01-17 MED ORDER — LEVOTHYROXINE SODIUM 100 MCG PO TABS: 100.0000 ug | ORAL_TABLET | Freq: Every day | ORAL | 6 refills | Status: DC

## 2019-01-17 MED ORDER — PROMETHAZINE HCL 25 MG RE SUPP: 25.0000 mg | Freq: Four times a day (QID) | RECTAL | 1 refills | Status: DC | PRN

## 2019-01-17 NOTE — Progress Notes (Signed)
Pt discharged home in stable condition 

## 2019-01-17 NOTE — Progress Notes (Signed)
English A Demaree to be D/C'd  per MD order. Discussed with the patient and all questions fully answered.  VSS, Skin clean, dry and intact without evidence of skin break down, no evidence of skin tears noted.  IV catheter discontinued intact. Site without signs and symptoms of complications. Dressing and pressure applied.  An After Visit Summary was printed and given to the patient. Patient received prescription.  D/c education completed with patient/family including follow up instructions, medication list, d/c activities limitations if indicated, with other d/c instructions as indicated by MD - patient able to verbalize understanding, all questions fully answered.   Patient instructed to return to ED, call 911, or call MD for any changes in condition.   Patient to be escorted via [place], and D/C home via private auto.

## 2019-01-17 NOTE — Progress Notes (Signed)
Patient ID: Sean Stanley, male   DOB: 1941/02/02, 78 y.o.   MRN: [id number] Subjective: He is feeling much better today.  Much less pain and swallowing better.  Objective: Vital signs in last 24 hours: Temp:  [97.6 F (36.4 C)-98.1 F (36.7 C)] 98.1 F (36.7 C) (10/25 0538) Pulse Rate:  [72-82] 74 (10/25 0538) Resp:  [16-18] 17 (10/25 0538) BP: (92-121)/(60-88) 105/67 (10/25 0538) SpO2:  [93 %-96 %] 95 % (10/25 0538) Weight change:  Last BM Date: 01/15/19  Intake/Output from previous day: 10/24 0701 - 10/25 0700 In: 2669.5 [P.O.:690; I.V.:1979.5] Out: 560 [Urine:550; Drains:10] Intake/Output this shift: Total I/O In: 277 [P.O.:277] Out: 200 [Urine:200]  PHYSICAL EXAM: Incision looks excellent.  Drain removed.  Voice no change.  Lab Results: Recent Labs    01/15/19 0919  HGB 12.2*  HCT 36.0*   BMET Recent Labs    01/15/19 0919  01/16/19 1838 01/17/19 0254  NA 141  --   --   --   K 4.1  --   --   --   CL 105  --   --   --   GLUCOSE 97  --   --   --   BUN 17  --   --   --   CREATININE 1.20  --   --   --   CALCIUM  --    < > 8.2* 7.8*   < > = values in this interval not displayed.    Studies/Results: No results found.  Medications: I have reviewed the patient's current medications.  Assessment/Plan: Doing well, drain out, discharge home.  LOS: 2 days   [name] 01/17/2019, 9:09 AM

## 2019-01-17 NOTE — Discharge Summary (Signed)
Physician Discharge Summary  Patient ID: ADIV BONAFEDE MRN: [id number] DOB/AGE: 77-Mar-1942 77 y.o.  Admit date: 01/15/2019 Discharge date: 01/17/2019  Admission Diagnoses: Thyroid cancer  Discharge Diagnoses:  Active Problems:   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[facility])   Discharged Condition: good  Hospital Course: No complications  Consults: none  Significant Diagnostic Studies: none  Treatments: surgery: Total thyroidectomy, left modified neck dissection, unilateral recurrent nerve sacrifice  Discharge Exam: Blood pressure 105/67, pulse 74, temperature 98.1 F (36.7 C), temperature source Oral, resp. rate 17, height 6\' 1"  (1.854 m), weight 101.3 kg, SpO2 95 %. PHYSICAL EXAM: Awake and alert.  Breathing well.  Voice hoarse and breathy.  Incision looks excellent.  Disposition: Discharge disposition: 01-Home or Self Care       Discharge Instructions    Diet - low sodium heart healthy   Complete by: As directed    Increase activity slowly   Complete by: As directed      Allergies as of 01/17/2019      Reactions   Aspirin    Has been instructed not to take any blood thinners even aspirin due to history of several aneurysms    Levaquin [levofloxacin In D5w]    tendonitis   Nickel Itching   Other    No blood thinners due to history of several aneurysms    Statins Other (See Comments)   Muscle aches   Allopurinol Rash   Ampicillin Rash   Did it involve swelling of the face/tongue/throat, SOB, or low BP? No Did it involve sudden or severe rash/hives, skin peeling, or any reaction on the inside of your mouth or nose? Yes Did you need to seek medical attention at a hospital or doctor's office? Yes When did it last happen?50+ years If all above answers are "NO", may proceed with cephalosporin use.      Medication List    TAKE these medications   acetaminophen 500 MG tablet Commonly known as: TYLENOL Take 1,000 mg by mouth daily as needed for moderate pain.    acyclovir 400 MG tablet Commonly known as: ZOVIRAX Take 1 tablet (400 mg total) by mouth daily. What changed: when to take this   alfuzosin 10 MG 24 hr tablet Commonly known as: UROXATRAL Take 10 mg by mouth at bedtime.   buPROPion 150 MG 24 hr tablet Commonly known as: WELLBUTRIN XL TAKE 1 TABLET DAILY   ezetimibe 10 MG tablet Commonly known as: ZETIA Take 10 mg by mouth at bedtime.   finasteride 5 MG tablet Commonly known as: PROSCAR Take 5 mg by mouth at bedtime.   HYDROcodone-acetaminophen 7.5-325 MG tablet Commonly known as: Norco Take 1 tablet by mouth every 6 (six) hours as needed for moderate pain.   levothyroxine 100 MCG tablet Commonly known as: Synthroid Take 1 tablet (100 mcg total) by mouth daily.   lidocaine-prilocaine cream Commonly known as: EMLA Apply to affected area once   metoprolol succinate 25 MG 24 hr tablet Commonly known as: TOPROL-XL Take 12.5 mg by mouth daily.   promethazine 25 MG suppository Commonly known as: PHENERGAN Place 1 suppository (25 mg total) rectally every 6 (six) hours as needed for nausea or vomiting.   SYSTANE HYDRATION PF OP Place 1 drop into both eyes 3 (three) times daily as needed (dry eyes).   Vitamin D 50 MCG (2000 UT) tablet Take 2,000 Units by mouth at bedtime.      Follow-up Information    Melida Quitter, MD. Schedule an appointment as soon as  possible for a visit in 1 week.   Specialty: Otolaryngology Contact information: [address] [place] [address] [phone]           Signed: [name] 01/17/2019, 9:11 AM

## 2019-01-17 NOTE — Discharge Instructions (Signed)
Keep incision clean and dry.  Apply antibiotic ointment twice daily.  It is okay to shower just be very gentle with scrubbing and drying.

## 2019-01-18 LAB — SURGICAL PATHOLOGY

## 2019-01-22 DIAGNOSIS — E7800 Pure hypercholesterolemia, unspecified: Secondary | ICD-10-CM | POA: Diagnosis not present

## 2019-01-22 DIAGNOSIS — J3801 Paralysis of vocal cords and larynx, unilateral: Secondary | ICD-10-CM | POA: Insufficient documentation

## 2019-01-22 DIAGNOSIS — N400 Enlarged prostate without lower urinary tract symptoms: Secondary | ICD-10-CM | POA: Diagnosis not present

## 2019-01-24 ENCOUNTER — Encounter: Payer: Self-pay | Admitting: Hematology and Oncology

## 2019-02-02 DIAGNOSIS — J3801 Paralysis of vocal cords and larynx, unilateral: Secondary | ICD-10-CM | POA: Diagnosis not present

## 2019-02-02 DIAGNOSIS — R1310 Dysphagia, unspecified: Secondary | ICD-10-CM | POA: Diagnosis not present

## 2019-02-02 DIAGNOSIS — R490 Dysphonia: Secondary | ICD-10-CM | POA: Diagnosis not present

## 2019-02-02 DIAGNOSIS — Z85850 Personal history of malignant neoplasm of thyroid: Secondary | ICD-10-CM | POA: Diagnosis not present

## 2019-02-08 DIAGNOSIS — R1313 Dysphagia, pharyngeal phase: Secondary | ICD-10-CM | POA: Diagnosis not present

## 2019-02-08 DIAGNOSIS — J3801 Paralysis of vocal cords and larynx, unilateral: Secondary | ICD-10-CM | POA: Diagnosis not present

## 2019-02-08 DIAGNOSIS — C73 Malignant neoplasm of thyroid gland: Secondary | ICD-10-CM | POA: Diagnosis not present

## 2019-02-09 DIAGNOSIS — R490 Dysphonia: Secondary | ICD-10-CM | POA: Diagnosis not present

## 2019-02-09 DIAGNOSIS — J3801 Paralysis of vocal cords and larynx, unilateral: Secondary | ICD-10-CM | POA: Diagnosis not present

## 2019-02-09 DIAGNOSIS — R1310 Dysphagia, unspecified: Secondary | ICD-10-CM | POA: Diagnosis not present

## 2019-02-11 DIAGNOSIS — J3801 Paralysis of vocal cords and larynx, unilateral: Secondary | ICD-10-CM | POA: Diagnosis not present

## 2019-02-11 DIAGNOSIS — J383 Other diseases of vocal cords: Secondary | ICD-10-CM | POA: Diagnosis not present

## 2019-02-17 DIAGNOSIS — E785 Hyperlipidemia, unspecified: Secondary | ICD-10-CM | POA: Diagnosis not present

## 2019-02-17 DIAGNOSIS — G609 Hereditary and idiopathic neuropathy, unspecified: Secondary | ICD-10-CM | POA: Diagnosis not present

## 2019-02-17 DIAGNOSIS — I429 Cardiomyopathy, unspecified: Secondary | ICD-10-CM | POA: Diagnosis not present

## 2019-02-17 DIAGNOSIS — C73 Malignant neoplasm of thyroid gland: Secondary | ICD-10-CM | POA: Diagnosis not present

## 2019-02-17 DIAGNOSIS — N1832 Chronic kidney disease, stage 3b: Secondary | ICD-10-CM | POA: Diagnosis not present

## 2019-02-17 DIAGNOSIS — Z1331 Encounter for screening for depression: Secondary | ICD-10-CM | POA: Diagnosis not present

## 2019-02-17 DIAGNOSIS — Z952 Presence of prosthetic heart valve: Secondary | ICD-10-CM | POA: Diagnosis not present

## 2019-02-17 DIAGNOSIS — E890 Postprocedural hypothyroidism: Secondary | ICD-10-CM | POA: Diagnosis not present

## 2019-02-17 DIAGNOSIS — I700 Atherosclerosis of aorta: Secondary | ICD-10-CM | POA: Diagnosis not present

## 2019-02-17 DIAGNOSIS — C8310 Mantle cell lymphoma, unspecified site: Secondary | ICD-10-CM | POA: Diagnosis not present

## 2019-02-22 ENCOUNTER — Other Ambulatory Visit: Payer: Self-pay

## 2019-02-22 ENCOUNTER — Inpatient Hospital Stay: Payer: Medicare Other

## 2019-02-22 ENCOUNTER — Encounter: Payer: Self-pay | Admitting: Hematology and Oncology

## 2019-02-22 ENCOUNTER — Other Ambulatory Visit: Payer: Self-pay | Admitting: Hematology and Oncology

## 2019-02-22 ENCOUNTER — Inpatient Hospital Stay: Payer: Medicare Other | Attending: Hematology and Oncology | Admitting: Hematology and Oncology

## 2019-02-22 DIAGNOSIS — C8318 Mantle cell lymphoma, lymph nodes of multiple sites: Secondary | ICD-10-CM | POA: Insufficient documentation

## 2019-02-22 DIAGNOSIS — Z79899 Other long term (current) drug therapy: Secondary | ICD-10-CM | POA: Insufficient documentation

## 2019-02-22 DIAGNOSIS — E785 Hyperlipidemia, unspecified: Secondary | ICD-10-CM

## 2019-02-22 DIAGNOSIS — C73 Malignant neoplasm of thyroid gland: Secondary | ICD-10-CM | POA: Insufficient documentation

## 2019-02-22 DIAGNOSIS — R479 Unspecified speech disturbances: Secondary | ICD-10-CM

## 2019-02-22 DIAGNOSIS — F809 Developmental disorder of speech and language, unspecified: Secondary | ICD-10-CM | POA: Insufficient documentation

## 2019-02-22 DIAGNOSIS — I890 Lymphedema, not elsewhere classified: Secondary | ICD-10-CM | POA: Insufficient documentation

## 2019-02-22 LAB — LIPID PANEL
Cholesterol: 185 mg/dL (ref 0–200)
HDL: 58 mg/dL
LDL Cholesterol: 104 mg/dL — ABNORMAL HIGH (ref 0–99)
Total CHOL/HDL Ratio: 3.2 ratio
Triglycerides: 115 mg/dL
VLDL: 23 mg/dL (ref 0–40)

## 2019-02-22 LAB — CBC WITH DIFFERENTIAL (CANCER CENTER ONLY)
Abs Immature Granulocytes: 0.08 10*3/uL — ABNORMAL HIGH (ref 0.00–0.07)
Basophils Absolute: 0 10*3/uL (ref 0.0–0.1)
Basophils Relative: 0 %
Eosinophils Absolute: 0.3 10*3/uL (ref 0.0–0.5)
Eosinophils Relative: 6 %
HCT: 38.3 % — ABNORMAL LOW (ref 39.0–52.0)
Hemoglobin: 12.2 g/dL — ABNORMAL LOW (ref 13.0–17.0)
Immature Granulocytes: 1 %
Lymphocytes Relative: 5 %
Lymphs Abs: 0.3 10*3/uL — ABNORMAL LOW (ref 0.7–4.0)
MCH: 30.3 pg (ref 26.0–34.0)
MCHC: 31.9 g/dL (ref 30.0–36.0)
MCV: 95.3 fL (ref 80.0–100.0)
Monocytes Absolute: 0.7 10*3/uL (ref 0.1–1.0)
Monocytes Relative: 11 %
Neutro Abs: 4.6 10*3/uL (ref 1.7–7.7)
Neutrophils Relative %: 77 %
Platelet Count: 152 10*3/uL (ref 150–400)
RBC: 4.02 MIL/uL — ABNORMAL LOW (ref 4.22–5.81)
RDW: 13.1 % (ref 11.5–15.5)
WBC Count: 6 10*3/uL (ref 4.0–10.5)
nRBC: 0 % (ref 0.0–0.2)

## 2019-02-22 LAB — CMP (CANCER CENTER ONLY)
ALT: 21 U/L (ref 0–44)
AST: 15 U/L (ref 15–41)
Albumin: 3.8 g/dL (ref 3.5–5.0)
Alkaline Phosphatase: 63 U/L (ref 38–126)
Anion gap: 12 (ref 5–15)
BUN: 25 mg/dL — ABNORMAL HIGH (ref 8–23)
CO2: 23 mmol/L (ref 22–32)
Calcium: 8.7 mg/dL — ABNORMAL LOW (ref 8.9–10.3)
Chloride: 106 mmol/L (ref 98–111)
Creatinine: 1.29 mg/dL — ABNORMAL HIGH (ref 0.61–1.24)
GFR, Est AFR Am: >60 mL/min (ref >60)
GFR, Estimated: 53 mL/min — ABNORMAL LOW (ref >60)
Glucose, Bld: 104 mg/dL — ABNORMAL HIGH (ref 70–99)
Potassium: 4.1 mmol/L (ref 3.5–5.1)
Sodium: 141 mmol/L (ref 135–145)
Total Bilirubin: 1.1 mg/dL (ref 0.3–1.2)
Total Protein: 6.1 g/dL — ABNORMAL LOW (ref 6.5–8.1)

## 2019-02-22 MED ORDER — SODIUM CHLORIDE 0.9% FLUSH
10.0000 mL | Freq: Once | INTRAVENOUS | Status: AC
  Administered 2019-02-22: 10 mL
  Filled 2019-02-22: qty 10

## 2019-02-22 MED ORDER — HEPARIN SOD (PORK) LOCK FLUSH 100 UNIT/ML IV SOLN
500.0000 [IU] | Freq: Once | INTRAVENOUS | Status: AC
  Administered 2019-02-22: 500 [IU]
  Filled 2019-02-22: qty 5

## 2019-02-22 NOTE — Assessment & Plan Note (Signed)
The patient had multiple complications since his thyroid cancer surgery He has no clinical findings to suggest cancer recurrence For now, I recommend holding of maintenance rituximab until he fully recovered from complications from his recent thyroid surgery He agreed with the plan of care

## 2019-02-22 NOTE — Progress Notes (Signed)
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 Ambulatory Referral to Speech Therapy  (specifically to Garald Balding)    Referral Priority:   Routine    Referral Type:   Speech Therapy    Referral Reason:   Specialty Services Required    Requested Specialty:   Speech Pathology    Number of Visits Requested:   1  . Ambulatory Referral to Physical Therapy    Referral Priority:   Routine    Referral Type:   Physical Medicine    Referral Reason:   Specialty Services Required    Requested Specialty:   Physical Therapy    Number of Visits Requested:   1    INTERVAL HISTORY: Please see below for problem oriented charting. He returns for further follow-up I also spoke with his wife over the telephone I have reviewed extensive documentation from [facility] Since his recent  surgery, the patient had multiple different complications He has minimal neck pain He complained of intermittent dysphagia which has improved since he underwent injection to his vocal cord on November 19 He also have difficulties with his voice in the evening No new lymphadenopathy.  Denies recent infection, fever or chills He has lost some weight since last time I saw him but he feels healthy  SUMMARY OF ONCOLOGIC HISTORY: Oncology History Overview Note  MIPI score 7.5 high risk   Mantle cell lymphoma ([place])  06/13/2017 Imaging   Ct imaging at [facility] 1. 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 2. 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 3. Slight reduction in size of right retroperitoneal evolving hematoma.   03/30/2018 Imaging   Ct neck 1. Extensive adenopathy on the left. The largest node is a level 1/submandibular node measuring 38 x 24 x 22 mm. Largest level 2 node measures 3.2 x 2 x 2 cm. Numerous other smaller but round lymph nodes throughout the left neck in the level 2 through level 4 region. The largest level 5 node measures 3.4 x 3.4 x 2.3 cm with a transverse diameter of 2.3 cm. This contains some internal calcifications. There are numerous other pathologic nodes in the left supraclavicular to axillary region. This pattern of disease could be due to extensive left neck metastatic disease from unknown primary, lymphoma, or other systemic malignancy. 2. No evidence of mucosal or submucosal lesion. 3. Diameter  of the ascending aorta is 4.5 cm. Recommend annual imaging followup by CTA or MRA. Aortic Atherosclerosis (ICD10-I70.0).    04/10/2018 Pathology Results   Left zone 1 neck mass, Fine Needle Aspiration I (smears and cell block): Atypical lymphoid proliferation. See comment.  Specimen Adequacy:  Satisfactory for evaluation.  COMMENT:The aspirate demonstrates abundant small to medium sized lymphocytes with scattered epithelioid cells in the background which may be histiocytes. The smears are cellular, [name], the findings indicate an atypical lymphoid proliferation but are too limited for definitive diagnosis. Excisional biopsy with flow cytometry is recommended.   04/10/2018 Procedure   He was ENT who performed FNA of neck LN   04/27/2018 Pathology Results   Lymph node for lymphoma, [name], [name], [name], [name], [name], [name], [name], the features are consistent with a mantle cell lymphoma   05/13/2018 PET scan   1. Hypermetabolic left cervical lymphadenopathy, left axillary lymphadenopathy, mediastinal / right hilar lymphadenopathy, right external iliac lymphadenopathy, and subcentimeter left external iliac and left inguinal lymph nodes, [name], [name], that is similar to slightly above liver FDG  activity, without focal lesions, that may represent lymphomatous involvement of the spleen.   05/21/2018 Cancer Staging   Staging form: Hodgkin and Non-Hodgkin Lymphoma, AJCC 8th Edition - Clinical: Stage III - Signed by Heath Lark, MD on 05/21/2018   05/26/2018 Procedure   Ultrasound and fluoroscopically guided right internal jugular single lumen power port catheter insertion. Tip in the SVC/RA junction. Catheter ready for use.    06/01/2018 -  Chemotherapy   The patient had Bendamustine and Rituximab for chemotherapy treatment   08/24/2018 PET scan   1. Partial response to therapy with complete resolution of size and metabolic activity of the dominant LEFT submandibular node (level 1) and LEFT axillary nodes. 2. Persistent and increased metabolic activity of left level 3 lymph nodes ( [place] 4). Nodes are partially calcified and similar size. 3. No new metastatic disease. 4. Normal spleen and bone marrow.   11/09/2018 PET scan   IMPRESSION: 1. Mild response to therapy of hypermetabolic left cervical nodes. ([place]) 4. 2. No new or progressive disease. 3. Coronary artery atherosclerosis. Aortic Atherosclerosis (ICD10-I70.0). 4. Trace cul-de-sac fluid, similar.   12/18/2018 Imaging   1. 4 mm solid nodule in the right lower lobe, which is technically indeterminate. Recommend attention on follow-up per clinical protocol. 2. Small calcified nodule adjacent to the left lobe of the thyroid, which may represent thyroid nodule or calcified lymph node. Recommend correlation with thyroid ultrasound. 3. The ascending aorta appears mildly dilated, measuring up to 4.2cm. 4. Decreased left axillary lymphadenopathy.   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([place])  11/23/2018 Pathology Results   Lymph node for lymphoma,  Left zone 3 - PAPILLARY THYROID CARCINOMA. - SEE MICROSCOPIC DESCRIPTION. Microscopic Comment The specimen consists mostly of encapsulated papillary thyroid carcinoma. There are portions  of lymphoid tissue attached to the periphery of the specimen and in the adjacent adipose tissue which suggests that this may represent papillary carcinoma metastatic to a lymph node.   11/23/2018 Surgery   PREOPERATIVE DIAGNOSIS:  Mantle cell lymphoma and cervical lymphadenopathy.   POSTOPERATIVE DIAGNOSIS:  Mantle cell lymphoma and cervical lymphadenopathy.   PROCEDURE:  Excisional biopsy of left cervical lymph nodes.     01/15/2019 Pathology Results   A. SOFT TISSUE, NECK, ADJACENT TO LEFT RECURRENT NERVE, BIOPSY: - Papillary thyroid carcinoma. B. 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 C. ADDITIONAL LEFT LEVEL 4 TISSUE, EXCISION: - There is no evidence of carcinoma in 2 of 2 lymph nodes (0/2). D. TOTAL THYROIDECTOMY: - Papillary thyroid carcinoma, 0.8 cm. - Carcinoma is broadly present at an inked tissue edge. - See oncology table below. E. LEFT LEVEL 6 TISSUE, EXCISION: - Benign fibroadipose tissue. - There is no evidence of malignancy. F. LEFT RECURRENT NERVE AND SURROUNDING TISSUE, EXCISION: - Metastatic papillary thyroid carcinoma in 3 of 3 lymph nodes (3/3).  THYROID GLAND: Procedure: Thyroidectomy Tumor Focality: Unifocal Tumor Site: Left lobe Tumor Size: 0.8 cm Histologic Type: 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Margins: Carcinoma is broadly present at a black inked tissue edge. 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   PROCEDURE:  Total thyroidectomy and left modified radical neck dissection.   SURGEON:  Melida Quitter, MD     FINDINGS:  There was a firm nodule in zone IIB on the left side and also some firm tissue and adherence to the jugular vein in zone III region requiring removal of a portion of the vein.  There was a firm nodule in the left thyroid lobe that extended posterior to the gland, adhering to the trachea, and encompassing the recurrent laryngeal nerve.  Tissue was sent for frozen section from adjacent to the nerve, demonstrating papillary carcinoma.  The nerve stimulated distal to the mass but not proximal to the mass; thus, the nerve was removed with the mass.  The left-sided parathyroid glands were visualized, as was the right superior parathyroid gland.  The right recurrent nerve was kept safe and stimulated well at the end of the case.     REVIEW OF SYSTEMS:   Constitutional: Denies fevers, chills  Eyes: Denies blurriness of vision Ears, nose, mouth, throat, and face: Denies mucositis or sore throat Respiratory: Denies cough, dyspnea or wheezes Cardiovascular: Denies palpitation, chest discomfort or lower extremity swelling Gastrointestinal:  Denies nausea, heartburn or change in bowel habits Skin: Denies abnormal skin rashes Lymphatics: Denies new lymphadenopathy or easy bruising Neurological:Denies numbness, tingling or new weaknesses Behavioral/Psych: Mood is stable, no new changes  All other systems were reviewed with the patient and are negative.  I have reviewed the past medical history, past surgical history,  social history and family history with the patient and they are unchanged from previous note.  ALLERGIES:  is allergic to aspirin; levaquin [levofloxacin in d5w]; nickel; other; statins; allopurinol; and ampicillin.  MEDICATIONS:  Current Outpatient  Medications  Medication Sig Dispense Refill  . acetaminophen (TYLENOL) 500 MG tablet Take 1,000 mg by mouth daily as needed for moderate pain.    Marland Kitchen acyclovir (ZOVIRAX) 400 MG tablet Take 1 tablet (400 mg total) by mouth daily. (Patient taking differently: Take 400 mg by mouth daily at 12 noon. ) 90 tablet 3  . alfuzosin (UROXATRAL) 10 MG 24 hr tablet Take 10 mg by mouth at bedtime.     Marland Kitchen buPROPion (WELLBUTRIN XL) 150 MG 24 hr tablet TAKE 1 TABLET DAILY (Patient taking differently: Take 150 mg by mouth daily. ) 90 tablet 3  . Cholecalciferol (VITAMIN D) 2000 units tablet Take 2,000 Units by mouth at bedtime.     Marland Kitchen ezetimibe (ZETIA) 10 MG tablet Take 10 mg by mouth at bedtime.    . finasteride (PROSCAR) 5 MG tablet Take 5 mg by mouth at bedtime.     Marland Kitchen HYDROcodone-acetaminophen (NORCO) 7.5-325 MG tablet Take 1 tablet by mouth every 6 (six) hours as needed for moderate pain. 20 tablet 0  . levothyroxine (SYNTHROID) 100 MCG tablet Take 1 tablet (100 mcg total) by mouth daily. 30 tablet 6  . lidocaine-prilocaine (EMLA) cream Apply to affected area once (Patient not taking: Reported on 01/07/2019) 30 g 3  . metoprolol succinate (TOPROL-XL) 25 MG 24 hr tablet Take 12.5 mg by mouth daily.     Vladimir Faster Glycol-Propyl Glycol (SYSTANE HYDRATION PF OP) Place 1 drop into both eyes 3 (three) times daily as needed (dry eyes).     . promethazine (PHENERGAN) 25 MG suppository Place 1 suppository (25 mg total) rectally every 6 (six) hours as needed for nausea or vomiting. 12 suppository 1   No current facility-administered medications for this visit.     PHYSICAL EXAMINATION: ECOG PERFORMANCE STATUS: 1 - Symptomatic but completely ambulatory  Vitals:   02/22/19 0821  BP: 120/71  Pulse: 77  Resp: 18  Temp: 98.2 F (36.8 C)  SpO2: 96%   Filed Weights   02/22/19 0821  Weight: 219 lb (99.3 kg)    GENERAL:alert, no distress and comfortable SKIN: skin color, texture, turgor are normal, no rashes or  significant lesions EYES: normal, Conjunctiva are pink and non-injected, sclera clear OROPHARYNX:no exudate, no erythema and lips, buccal mucosa, and tongue normal  NECK: Noted well-healed surgical scar.  Noted some mild lymphedema on his neck  LYMPH:  no palpable lymphadenopathy in the cervical, axillary or inguinal LUNGS: clear to auscultation and percussion with normal breathing effort HEART: regular rate & rhythm and no murmurs and no lower extremity edema ABDOMEN:abdomen soft, non-tender and normal bowel sounds Musculoskeletal:no cyanosis of digits and no clubbing  NEURO: alert & oriented x 3 with fluent speech, no focal motor/sensory deficits  LABORATORY DATA:  I have reviewed the data as listed    Component Value Date/Time   NA 141 02/22/2019 0806   K 4.1 02/22/2019 0806   CL 106 02/22/2019 0806   CO2 23 02/22/2019 0806   GLUCOSE 104 (H) 02/22/2019 0806   BUN 25 (H) 02/22/2019 0806   CREATININE 1.29 (H) 02/22/2019 0806   CALCIUM 8.7 (L) 02/22/2019 0806   PROT 6.1 (L) 02/22/2019 0806   PROT 6.6 07/23/2017 1504   ALBUMIN 3.8 02/22/2019 0806   AST 15 02/22/2019  0806   ALT 21 02/22/2019 0806   ALKPHOS 63 02/22/2019 0806   BILITOT 1.1 02/22/2019 0806   GFRNONAA 53 (L) 02/22/2019 0806   GFRAA >60 02/22/2019 0806    No results found for: SPEP, UPEP  Lab Results  Component Value Date   WBC 6.0 02/22/2019   NEUTROABS 4.6 02/22/2019   HGB 12.2 (L) 02/22/2019   HCT 38.3 (L) 02/22/2019   MCV 95.3 02/22/2019   PLT 152 02/22/2019      Chemistry      Component Value Date/Time   NA 141 02/22/2019 0806   K 4.1 02/22/2019 0806   CL 106 02/22/2019 0806   CO2 23 02/22/2019 0806   BUN 25 (H) 02/22/2019 0806   CREATININE 1.29 (H) 02/22/2019 0806      Component Value Date/Time   CALCIUM 8.7 (L) 02/22/2019 0806   ALKPHOS 63 02/22/2019 0806   AST 15 02/22/2019 0806   ALT 21 02/22/2019 0806   BILITOT 1.1 02/22/2019 0806       All questions were answered. The patient  knows to call the clinic with any problems, questions or concerns. No barriers to learning was detected.  I spent 25 minutes counseling the patient face to face. The total time spent in the appointment was 30 minutes and more than 50% was on counseling and review of test results  Heath Lark, MD 02/22/2019 10:19 AM

## 2019-02-22 NOTE — Assessment & Plan Note (Signed)
He has some speech difficulties since his thyroid surgery I recommend referral to speech and language therapist and he agreed

## 2019-02-22 NOTE — Assessment & Plan Note (Signed)
The patient is scheduled to see endocrinologist for radioactive iodine soon I have reviewed multiple documentation from his recent surgery and documentation at [place] supportive care

## 2019-02-22 NOTE — Assessment & Plan Note (Signed)
He has acquired lymphedema from recent thyroid surgery I recommend physical therapy and rehab and he agreed

## 2019-02-24 ENCOUNTER — Telehealth: Payer: Self-pay | Admitting: Hematology and Oncology

## 2019-02-24 NOTE — Telephone Encounter (Signed)
Confirmed 1/25 appointments with patient.

## 2019-02-25 ENCOUNTER — Other Ambulatory Visit (HOSPITAL_COMMUNITY): Payer: Self-pay | Admitting: Endocrinology

## 2019-03-01 ENCOUNTER — Other Ambulatory Visit (HOSPITAL_COMMUNITY): Payer: Self-pay | Admitting: Endocrinology

## 2019-03-02 ENCOUNTER — Ambulatory Visit: Payer: Self-pay | Admitting: Adult Health

## 2019-03-02 ENCOUNTER — Other Ambulatory Visit (HOSPITAL_COMMUNITY): Payer: Self-pay | Admitting: Endocrinology

## 2019-03-02 DIAGNOSIS — J383 Other diseases of vocal cords: Secondary | ICD-10-CM | POA: Diagnosis not present

## 2019-03-02 DIAGNOSIS — C73 Malignant neoplasm of thyroid gland: Secondary | ICD-10-CM

## 2019-03-02 DIAGNOSIS — R1310 Dysphagia, unspecified: Secondary | ICD-10-CM | POA: Diagnosis not present

## 2019-03-02 DIAGNOSIS — R490 Dysphonia: Secondary | ICD-10-CM | POA: Diagnosis not present

## 2019-03-02 DIAGNOSIS — J3801 Paralysis of vocal cords and larynx, unilateral: Secondary | ICD-10-CM | POA: Diagnosis not present

## 2019-03-03 DIAGNOSIS — J3801 Paralysis of vocal cords and larynx, unilateral: Secondary | ICD-10-CM | POA: Diagnosis not present

## 2019-03-03 DIAGNOSIS — R1313 Dysphagia, pharyngeal phase: Secondary | ICD-10-CM | POA: Diagnosis not present

## 2019-03-03 DIAGNOSIS — C73 Malignant neoplasm of thyroid gland: Secondary | ICD-10-CM | POA: Diagnosis not present

## 2019-03-10 ENCOUNTER — Ambulatory Visit (HOSPITAL_COMMUNITY): Payer: Medicare Other

## 2019-03-10 ENCOUNTER — Encounter (HOSPITAL_COMMUNITY)
Admission: RE | Admit: 2019-03-10 | Discharge: 2019-03-10 | Disposition: A | Discharge status: Home / Self Care | Payer: Medicare Other | Source: Ambulatory Visit | Attending: Endocrinology | Admitting: Endocrinology

## 2019-03-10 ENCOUNTER — Other Ambulatory Visit: Payer: Self-pay

## 2019-03-10 DIAGNOSIS — C73 Malignant neoplasm of thyroid gland: Secondary | ICD-10-CM | POA: Diagnosis not present

## 2019-03-10 MED ORDER — THYROTROPIN ALFA 1.1 MG IM SOLR
INTRAMUSCULAR | Status: AC
  Filled 2019-03-10: qty 0.9

## 2019-03-10 MED ORDER — THYROTROPIN ALFA 1.1 MG IM SOLR
0.9000 mg | INTRAMUSCULAR | Status: AC
  Administered 2019-03-10: 0.9 mg via INTRAMUSCULAR

## 2019-03-11 ENCOUNTER — Encounter (HOSPITAL_COMMUNITY)
Admission: RE | Admit: 2019-03-11 | Discharge: 2019-03-11 | Disposition: A | Discharge status: Home / Self Care | Payer: Medicare Other | Source: Ambulatory Visit | Attending: Endocrinology | Admitting: Endocrinology

## 2019-03-11 ENCOUNTER — Ambulatory Visit (HOSPITAL_COMMUNITY): Payer: Medicare Other

## 2019-03-11 ENCOUNTER — Encounter: Payer: Self-pay | Admitting: Hematology and Oncology

## 2019-03-11 DIAGNOSIS — C73 Malignant neoplasm of thyroid gland: Secondary | ICD-10-CM | POA: Diagnosis not present

## 2019-03-11 MED ORDER — THYROTROPIN ALFA 1.1 MG IM SOLR
INTRAMUSCULAR | Status: AC
  Filled 2019-03-11: qty 0.9

## 2019-03-11 MED ORDER — THYROTROPIN ALFA 1.1 MG IM SOLR
0.9000 mg | INTRAMUSCULAR | Status: AC
  Administered 2019-03-11: 14:00:00 0.9 mg via INTRAMUSCULAR

## 2019-03-12 ENCOUNTER — Encounter (HOSPITAL_COMMUNITY)
Admission: RE | Admit: 2019-03-12 | Discharge: 2019-03-12 | Disposition: A | Discharge status: Home / Self Care | Payer: Medicare Other | Source: Ambulatory Visit | Attending: Endocrinology | Admitting: Endocrinology

## 2019-03-12 ENCOUNTER — Inpatient Hospital Stay (HOSPITAL_COMMUNITY): Admission: RE | Admit: 2019-03-12 | Payer: Medicare Other | Source: Ambulatory Visit

## 2019-03-12 ENCOUNTER — Other Ambulatory Visit: Payer: Self-pay

## 2019-03-12 ENCOUNTER — Encounter: Payer: Self-pay | Admitting: Hematology and Oncology

## 2019-03-12 DIAGNOSIS — C73 Malignant neoplasm of thyroid gland: Secondary | ICD-10-CM | POA: Diagnosis not present

## 2019-03-22 ENCOUNTER — Other Ambulatory Visit: Payer: Self-pay

## 2019-03-22 ENCOUNTER — Ambulatory Visit (HOSPITAL_COMMUNITY)
Admission: RE | Admit: 2019-03-22 | Discharge: 2019-03-22 | Disposition: A | Discharge status: Home / Self Care | Payer: Medicare Other | Source: Ambulatory Visit | Attending: Endocrinology | Admitting: Endocrinology

## 2019-03-22 DIAGNOSIS — C73 Malignant neoplasm of thyroid gland: Secondary | ICD-10-CM | POA: Diagnosis not present

## 2019-03-23 ENCOUNTER — Ambulatory Visit: Payer: Medicare Other | Admitting: Rehabilitation

## 2019-03-23 ENCOUNTER — Ambulatory Visit: Payer: Medicare Other

## 2019-03-24 ENCOUNTER — Other Ambulatory Visit: Payer: Self-pay | Admitting: Internal Medicine

## 2019-03-24 ENCOUNTER — Ambulatory Visit
Admission: RE | Admit: 2019-03-24 | Discharge: 2019-03-24 | Disposition: A | Discharge status: Home / Self Care | Payer: Medicare Other | Source: Ambulatory Visit | Attending: Internal Medicine | Admitting: Internal Medicine

## 2019-03-24 DIAGNOSIS — R079 Chest pain, unspecified: Secondary | ICD-10-CM

## 2019-03-24 DIAGNOSIS — C73 Malignant neoplasm of thyroid gland: Secondary | ICD-10-CM

## 2019-03-28 ENCOUNTER — Encounter: Payer: Self-pay | Admitting: Hematology and Oncology

## 2019-03-29 ENCOUNTER — Encounter: Payer: Self-pay | Admitting: Physical Therapy

## 2019-03-29 ENCOUNTER — Ambulatory Visit: Payer: Medicare Other | Attending: Hematology and Oncology | Admitting: Physical Therapy

## 2019-03-29 ENCOUNTER — Other Ambulatory Visit: Payer: Self-pay

## 2019-03-29 DIAGNOSIS — M542 Cervicalgia: Secondary | ICD-10-CM

## 2019-03-29 DIAGNOSIS — M6281 Muscle weakness (generalized): Secondary | ICD-10-CM | POA: Diagnosis not present

## 2019-03-29 NOTE — Therapy (Signed)
[place], [place], [address] Phone: [phone]   Fax:  [phone]  Physical Therapy Evaluation  Patient Details  Name: Sean Stanley MRN: [id number] Date of Birth: [birth year] Referring Provider (PT): Alvy Bimler   Encounter Date: 03/29/2019  PT End of Session - 03/29/19 1048    Visit Number  1    Number of Visits  5    Date for PT Re-Evaluation  04/26/19    PT Start Time  1005    PT Stop Time  1048    PT Time Calculation (min)  43 min    Activity Tolerance  Patient tolerated treatment well    Behavior During Therapy  [facility] for tasks assessed/performed       Past Medical History:  Diagnosis Date  . 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 Atrial flutter ([place])   . BPH (benign prostatic hyperplasia)   . Cancer ([place])    melanoma on lower right back and left chest - surgically removed and cleared  . Dysrhythmia   . 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 Headache    migraine- not current  . 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 History of blood transfusion   . Hypercholesteremia   . Hypercholesterolemia   . Lymphoma ([place]) 04/2018  . Mitral valve disease    annuloplasty 2002 [facility]  . Neuropathy   . Neuropathy of both feet    pt states due to exposure to [employer]  . OSA (obstructive sleep apnea)    does not use cpap, Dr. Maxwell Caul told him he had improved  . Pneumonia   . Thoracic ascending aortic aneurysm (HCC)    4.5 cm 03/2018 CT  . Thyroid cancer [facility])     Past Surgical History:  Procedure  Laterality Date  . ABDOMINAL ANGIOGRAM  08/05/12  . aneurysm repair    . CARDIOVERSION  02/12/2012   Procedure: CARDIOVERSION;  Surgeon: Pixie Casino, MD;  Location: [facility] ENDOSCOPY;  Service: Cardiovascular;  Laterality: N/A;  . CARDIOVERSION N/A 06/22/2013   Procedure: CARDIOVERSION;  Surgeon: [name], MD;  Location: [place];  Service: Cardiovascular;  Laterality: N/A;  . CATARACT EXTRACTION Bilateral 2018   with lens implant  . CHOLECYSTECTOMY    . COLONOSCOPY    . ESOPHAGOGASTRODUODENOSCOPY    . IR IMAGING GUIDED PORT INSERTION  05/26/2018  . LYMPH NODE BIOPSY Left 04/27/2018   Procedure: EXCISIONAL BIOPSY OF LEFT CERVICAL LYMPH NODE;  Surgeon: Melida Quitter, MD;  Location: [place];  Service: ENT;  Laterality: Left;  . LYMPH NODE BIOPSY Left 11/23/2018   Procedure: LEFT CERVICAL LYMPH NODE OPEN BIOPSY;  Surgeon: Melida Quitter, MD;  Location: [place];  Service: ENT;  Laterality: Left;  . MENISCUS REPAIR Right 2009  . MITRAL VALVE REPAIR  2002   [facility]  . NM MYOVIEW LTD  07/22/2006   no ischemia  . RADICAL NECK DISSECTION Left 01/15/2019   Procedure: LEFT NECK DISSECTION;  Surgeon: Melida Quitter, MD;  Location: [place];  Service: ENT;  Laterality: Left;  . RIGHT HEART CATH  06/19/2004   normal right heart dynamics. EF 50%  . TEE WITHOUT CARDIOVERSION  02/12/2012   Procedure: TRANSESOPHAGEAL ECHOCARDIOGRAM (TEE);  Surgeon: Pixie Casino, MD;  Location: [facility] ENDOSCOPY;  Service: Cardiovascular;  Laterality: N/A;  . TEE WITHOUT CARDIOVERSION N/A 06/22/2013   Procedure: TRANSESOPHAGEAL ECHOCARDIOGRAM (TEE);  Surgeon: [name], MD;  Location: [place];  Service: Cardiovascular;  Laterality: N/A;  . THYROIDECTOMY N/A 01/15/2019   Procedure: TOTAL THYROIDECTOMY;  Surgeon: Melida Quitter, MD;  Location: [place];  Service: ENT;  Laterality: N/A;    There were no vitals filed for this visit.   Subjective Assessment - 03/29/19 1013    Subjective  When I saw Dr. Alvy Bimler I was  having post surgical swelling but that has now resolved. I am not having any pain. I do have some minimal tightness in the area of the scar when I turn my head.    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, 01/15/19 underwent left neck dissection and thyroidectomy for treatment of papillary thyroid cancer, pt completed 6 months of chemo in July and will begin again around March 2021 after next operation at [facility] to improve swallowing, 12/2000 mitral valve repair, 10/2009 R knee arthroscopic repair of meniscus, 01/2012 cardioversion, 07/2012 retroperitoneal abdominal bleed, 11/2012- coiling of aneurysm- 4 abdominal aneurysms remain, 01/2013 cardioversion, 07/2013 ablation, 2018 cataract srugery, 01/2019 vocal cord/fold injections    Patient Stated Goals  decreased neck tightness    Currently in Pain?  No/denies         [facility] PT Assessment - 03/29/19 0001      Assessment   Medical Diagnosis  papillary thyroid cancer, mantle cell lymphoma    Referring Provider (PT)  Alvy Bimler    Onset Date/Surgical Date  05/21/18    Hand Dominance  Right    Prior Therapy  none      Precautions   Precautions  Other (comment)    Precaution Comments  at risk of lymphedema      Restrictions   Weight Bearing Restrictions  No      Balance Screen   Has the patient fallen in the past 6 months  No    Has the patient had a decrease in activity level because of a fear of falling?   No    Is the patient reluctant to leave their home because of a fear of falling?   No      Home Film/video editor residence    Living Arrangements  Spouse/significant other    Available Help at Discharge  Family    Type of [place] Access  Level entry    [place]  One level      Prior Function   Level of [place]  Retired    Leisure  has not been exercising regularly since gym closures, tries to walk 2-2.5 miles 3-4x/wk      Cognition   Overall Cognitive Status   Within Functional Limits for tasks assessed      Observation/Other Assessments   Observations  left sided neck scar      Sensation   Additional Comments  pt reports peripheral neuropathy in both feet that has been there for 25 years most likely a result of agent orange      Posture/Postural Control   Posture/Postural Control  Postural limitations  Postural Limitations  Rounded Shoulders;Forward head      ROM / Strength   AROM / PROM / Strength  AROM      AROM   Overall AROM Comments  shoulders [facility]    AROM Assessment Site  Cervical    Cervical Flexion  46    Cervical Extension  43    Cervical - Right Side Bend  29    Cervical - Left Side Bend  25    Cervical - Right Rotation  50% limited    Cervical - Left Rotation  65% limited        LYMPHEDEMA/ONCOLOGY QUESTIONNAIRE - 03/29/19 1038      Lymphedema Assessments   Lymphedema Assessments  Head and Neck      Head and Neck   4 cm superior to sternal notch around neck  44 cm    6 cm superior to sternal notch around neck  44.2 cm    8 cm superior to sternal notch around neck  46 cm             Objective measurements completed on examination: See above findings.              PT Education - 03/29/19 1048    Education Details  Neck ROM, posture, breathing, walking, CURE article on staying active, "Why exercise?" flyer, lymphedema and PT info    Person(s) Educated  Patient    Methods  Explanation;Handout    Comprehension  Verbalized understanding          PT Long Term Goals - 03/29/19 1056      PT LONG TERM GOAL #1   Title  Pt will be able to sleep on R side without increased left sided neck tightness and discomfort.    Time  4    Period  Weeks    Status  New    Target Date  04/26/19      PT LONG TERM GOAL #2   Title  Pt will be able to turn and look over either shoulder at intersection while driving without limitation or discomfort in neck.    Time  4    Period  Weeks    Status  New     Target Date  04/26/19      PT LONG TERM GOAL #3   Title  Pt will be independent in a home exercise program for continued strengthening and stretching.    Time  4    Period  Weeks    Status  New    Target Date  04/26/19      PT LONG TERM GOAL #4   Title  Pt will report a 75% improvement in tightness around left neck dissection scar to improve comfort.    Time  4    Period  Weeks    Status  New    Target Date  04/26/19             Plan - 03/29/19 1049    Clinical Impression Statement  Pt presents to PT after undergoing a left neck dissection and thyroidectomy for treatment of papillary thyroid cancer on 01/15/19. He also recently underwent treatment for mantle cell lymphoma which was diagnosed in Feb 2020. Pt overall feels he is doing well. His post surgical swelling has improved and he no longer feels he is having any swelling. There may be some minimal swelling present just superior to neck dissection scar. He does have some discomfort in the left side  of neck with cervical ROM. He has decreased scar mobility on the L. Pt would benefit from skilled PT services to improve cervical ROM, decreased neck tightness and improve scar mobility.    Personal Factors and Comorbidities  Age;Comorbidity 2    Comorbidities  heart problems (atrial fib with cardioversions), abdominal aneuryms    Examination-Participation Restrictions  Driving    Stability/Clinical Decision Making  Stable/Uncomplicated    Clinical Decision Making  Low    Rehab Potential  Good    PT Frequency  1x / week    PT Duration  4 weeks    PT Treatment/Interventions  ADLs/Self Care Home Management;Therapeutic exercise;Manual techniques;Manual lymph drainage;Patient/family education;Scar mobilization;Passive range of motion    PT Next Visit Plan  PROM neck, scar massage    PT Home Exercise Plan  head and neck ROM exercises    Consulted and Agree with Plan of Care  Patient       Patient will benefit from skilled  therapeutic intervention in order to improve the following deficits and impairments:  Pain, Increased fascial restricitons, Decreased scar mobility, Decreased range of motion, Decreased strength, Increased edema  Visit Diagnosis: Cervicalgia  Muscle weakness (generalized)     Problem List Patient Active Problem List   Diagnosis Date Noted  . Acquired lymphedema 02/22/2019  . Speech and language deficits 02/22/2019  . 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([place]) 11/26/2018  . Lymphoma ([place]) 11/23/2018  . Hyperlipidemia 11/03/2018  . Neck discomfort 09/23/2018  . Other constipation 08/26/2018  . Pancytopenia, acquired ([place]) 07/28/2018  . 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 Insomnia secondary to chronic pain 10/28/2017  . OSA (obstructive sleep apnea) 10/28/2017  . OSA on CPAP 07/23/2017  . Neuropathy 07/23/2017  . RLS (restless legs syndrome) 07/23/2017  . Gallstones 03/30/2013  . Palpitations 02/16/2013  . 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 Pain in limb-Abdominal 09/21/2012  . Nontraumatic retroperitoneal hematoma, spontaneous while on anticoagulant therapy 08/29/2012  . Acute post-hemorrhagic anemia 08/29/2012  . Acute GI bleeding, spontaneous  08/06/2012  . Atrial flutter, status post TE guided cardioversion November 2013 08/06/2012  . 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/ring by Dr Evelina Dun at [facility] '02 08/06/2012  . Normal coronary arteries at cath 2006 (false positive Nuc) 08/06/2012  . 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 Contrast media allergy 08/06/2012  . Acute renal insufficiency 08/06/2012  . NSVT (nonsustained ventricular tachycardia) ([place]) 08/06/2012  . Hemorrhagic shock- now stable 08/04/2012  . Coagulopathy- chronic anticoagulation with Xarelto 08/04/2012  . Bradycardia- HR 58 NSR on admission 08/04/12 08/04/2012    Allyson Sabal [facility] 03/29/2019, 10:59 AM  [place], [place], [address] Phone: [phone]   Fax:  [phone]  Name: Sean Stanley MRN: [id number] Date of Birth: 06-Jul-1940  Manus Gunning, PT 03/29/19 10:59 AM

## 2019-04-06 ENCOUNTER — Encounter: Payer: Self-pay | Admitting: Physical Therapy

## 2019-04-06 ENCOUNTER — Ambulatory Visit: Payer: Medicare Other | Admitting: Physical Therapy

## 2019-04-06 ENCOUNTER — Other Ambulatory Visit: Payer: Self-pay

## 2019-04-06 DIAGNOSIS — M6281 Muscle weakness (generalized): Secondary | ICD-10-CM | POA: Diagnosis not present

## 2019-04-06 DIAGNOSIS — M542 Cervicalgia: Secondary | ICD-10-CM | POA: Diagnosis not present

## 2019-04-06 NOTE — Therapy (Signed)
[place], [place], [address] Phone: [phone]   Fax:  [phone]  Physical Therapy Treatment  Patient Details  Name: Sean Stanley MRN: [id number] Date of Birth: October 15, 1940 Referring Provider (PT): Alvy Bimler   Encounter Date: 04/06/2019  PT End of Session - 04/06/19 0852    Visit Number  2    Number of Visits  5    Date for PT Re-Evaluation  04/26/19    PT Start Time  0804    PT Stop Time  0850    PT Time Calculation (min)  46 min    Activity Tolerance  Patient tolerated treatment well    Behavior During Therapy  [facility] for tasks assessed/performed       Past Medical History:  Diagnosis Date  . 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 Atrial flutter ([place])   . BPH (benign prostatic hyperplasia)   . Cancer ([place])    melanoma on lower right back and left chest - surgically removed and cleared  . Dysrhythmia   . 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 Headache    migraine- not current  . 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 History of blood transfusion   . Hypercholesteremia   . Hypercholesterolemia   . Lymphoma ([place]) 04/2018  . Mitral valve disease    annuloplasty 2002 [facility]  . Neuropathy   . Neuropathy of both feet    pt states due to exposure to [employer]  . OSA (obstructive sleep apnea)    does not use cpap, Dr. Maxwell Caul told him he had improved  . Pneumonia   . Thoracic ascending aortic aneurysm (HCC)    4.5 cm 03/2018 CT  . Thyroid cancer [facility])     Past Surgical History:  Procedure  Laterality Date  . ABDOMINAL ANGIOGRAM  08/05/12  . aneurysm repair    . CARDIOVERSION  02/12/2012   Procedure: CARDIOVERSION;  Surgeon: Pixie Casino, MD;  Location: [facility] ENDOSCOPY;  Service: Cardiovascular;  Laterality: N/A;  . CARDIOVERSION N/A 06/22/2013   Procedure: CARDIOVERSION;  Surgeon: [name], MD;  Location: [place];  Service: Cardiovascular;  Laterality: N/A;  . CATARACT EXTRACTION Bilateral 2018   with lens implant  . CHOLECYSTECTOMY    . COLONOSCOPY    . ESOPHAGOGASTRODUODENOSCOPY    . IR IMAGING GUIDED PORT INSERTION  05/26/2018  . LYMPH NODE BIOPSY Left 04/27/2018   Procedure: EXCISIONAL BIOPSY OF LEFT CERVICAL LYMPH NODE;  Surgeon: Melida Quitter, MD;  Location: [place];  Service: ENT;  Laterality: Left;  . LYMPH NODE BIOPSY Left 11/23/2018   Procedure: LEFT CERVICAL LYMPH NODE OPEN BIOPSY;  Surgeon: Melida Quitter, MD;  Location: [place];  Service: ENT;  Laterality: Left;  . MENISCUS REPAIR Right 2009  . MITRAL VALVE REPAIR  2002   [facility]  . NM MYOVIEW LTD  07/22/2006   no ischemia  . RADICAL NECK DISSECTION Left 01/15/2019   Procedure: LEFT NECK DISSECTION;  Surgeon: Melida Quitter, MD;  Location: [place];  Service: ENT;  Laterality: Left;  . RIGHT HEART CATH  06/19/2004   normal right heart dynamics. EF 50%  . TEE WITHOUT CARDIOVERSION  02/12/2012   Procedure: TRANSESOPHAGEAL ECHOCARDIOGRAM (TEE);  Surgeon: Pixie Casino, MD;  Location: [facility] ENDOSCOPY;  Service: Cardiovascular;  Laterality: N/A;  . TEE WITHOUT CARDIOVERSION N/A 06/22/2013   Procedure: TRANSESOPHAGEAL ECHOCARDIOGRAM (TEE);  Surgeon: [name], MD;  Location: [place];  Service: Cardiovascular;  Laterality: N/A;  . THYROIDECTOMY N/A 01/15/2019   Procedure: TOTAL THYROIDECTOMY;  Surgeon: Melida Quitter, MD;  Location: [place];  Service: ENT;  Laterality: N/A;    There were no vitals filed for this visit.  Subjective Assessment - 04/06/19 0805    Subjective  My neck is sore after I finish the  exercises. I am not having any pain today. I can not tell that my range of motion is improving.    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, 01/15/19 underwent left neck dissection and thyroidectomy for treatment of papillary thyroid cancer, pt completed 6 months of chemo in July and will begin again around March 2021 after next operation at [facility] to improve swallowing, 12/2000 mitral valve repair, 10/2009 R knee arthroscopic repair of meniscus, 01/2012 cardioversion, 07/2012 retroperitoneal abdominal bleed, 11/2012- coiling of aneurysm- 4 abdominal aneurysms remain, 01/2013 cardioversion, 07/2013 ablation, 2018 cataract srugery, 01/2019 vocal cord/fold injections    Patient Stated Goals  decreased neck tightness    Currently in Pain?  No/denies                       [facility] Adult PT Treatment/Exercise - 04/06/19 0001      Manual Therapy   Manual Therapy  Myofascial release;Soft tissue mobilization;Passive ROM    Soft tissue mobilization  In supine to bilateral neck, holding neck in prolonged stretch to opposite side performed soft tissue mobilization to side of neck especially to bilateral SCMs and scalenes, also along left neck scar to improve mobility    Myofascial Release  along left neck scar using cross hand technique, suboccipital release    Passive ROM  to neck in direction of lateral flexion with prolonged holds to left and right and rotation to left and right                  PT Long Term Goals - 03/29/19 1056      PT LONG TERM GOAL #1   Title  Pt will be able to sleep on R side without increased left sided neck tightness and discomfort.    Time  4    Period  Weeks    Status  New    Target Date  04/26/19      PT LONG TERM GOAL #2   Title  Pt will be able to turn and look over either shoulder at intersection while driving without limitation or discomfort in neck.    Time  4    Period  Weeks    Status  New    Target Date  04/26/19      PT LONG TERM  GOAL #3   Title  Pt will be independent in a home exercise program for continued strengthening and stretching.    Time  4    Period  Weeks    Status  New    Target Date  04/26/19      PT LONG TERM GOAL #4   Title  Pt will report a 75% improvement in tightness around  left neck dissection scar to improve comfort.    Time  4    Period  Weeks    Status  New    Target Date  04/26/19              Patient will benefit from skilled therapeutic intervention in order to improve the following deficits and impairments:     Visit Diagnosis: Cervicalgia     Problem List Patient Active Problem List   Diagnosis Date Noted  . Acquired lymphedema 02/22/2019  . Speech and language deficits 02/22/2019  . 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([place]) 11/26/2018  . Lymphoma ([place]) 11/23/2018  . Hyperlipidemia 11/03/2018  . Neck discomfort 09/23/2018  . Other constipation 08/26/2018  . Pancytopenia, acquired ([place]) 07/28/2018  . 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 Insomnia secondary to chronic pain 10/28/2017  . OSA (obstructive sleep apnea) 10/28/2017  . OSA on CPAP 07/23/2017  . Neuropathy 07/23/2017  . RLS (restless legs syndrome) 07/23/2017  . Gallstones 03/30/2013  . Palpitations 02/16/2013  . 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 Pain in limb-Abdominal 09/21/2012  . Nontraumatic retroperitoneal hematoma, spontaneous while on anticoagulant therapy 08/29/2012  . Acute post-hemorrhagic anemia 08/29/2012  . Acute GI bleeding, spontaneous  08/06/2012  . Atrial flutter, status post TE guided cardioversion November 2013 08/06/2012  . 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/ring by Dr Evelina Dun at [facility] '02 08/06/2012  . Normal coronary arteries at cath 2006 (false positive Nuc) 08/06/2012  . 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 Contrast media allergy 08/06/2012  . Acute renal insufficiency 08/06/2012  . NSVT (nonsustained ventricular tachycardia) ([place])  08/06/2012  . Hemorrhagic shock- now stable 08/04/2012  . Coagulopathy- chronic anticoagulation with Xarelto 08/04/2012  . Bradycardia- HR 58 NSR on admission 08/04/12 08/04/2012    Allyson Sabal [facility] 04/06/2019, 8:57 AM  [place], [place], [address] Phone: [phone]   Fax:  [phone]  Name: Sean Stanley MRN: [id number] Date of Birth: [birth year]

## 2019-04-06 NOTE — Therapy (Signed)
[place], [place], [address] Phone: [phone]   Fax:  [phone]  Physical Therapy Treatment  Patient Details  Name: Sean Stanley MRN: [id number] Date of Birth: 1941-01-13 Referring Provider (PT): Alvy Bimler   Encounter Date: 04/06/2019  PT End of Session - 04/06/19 0852    Visit Number  2    Number of Visits  5    Date for PT Re-Evaluation  04/26/19    PT Start Time  0804    PT Stop Time  0850    PT Time Calculation (min)  46 min    Activity Tolerance  Patient tolerated treatment well    Behavior During Therapy  [facility] for tasks assessed/performed       Past Medical History:  Diagnosis Date  . 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 Atrial flutter ([place])   . BPH (benign prostatic hyperplasia)   . Cancer ([place])    melanoma on lower right back and left chest - surgically removed and cleared  . Dysrhythmia   . 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 Headache    migraine- not current  . 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 History of blood transfusion   . Hypercholesteremia   . Hypercholesterolemia   . Lymphoma ([place]) 04/2018  . Mitral valve disease    annuloplasty 2002 [facility]  . Neuropathy   . Neuropathy of both feet    pt states due to exposure to [employer]  . OSA (obstructive sleep apnea)    does not use cpap, Dr. Maxwell Caul told him he had improved  . Pneumonia   . Thoracic ascending aortic aneurysm (HCC)    4.5 cm 03/2018 CT  . Thyroid cancer [facility])     Past Surgical History:  Procedure  Laterality Date  . ABDOMINAL ANGIOGRAM  08/05/12  . aneurysm repair    . CARDIOVERSION  02/12/2012   Procedure: CARDIOVERSION;  Surgeon: Pixie Casino, MD;  Location: [facility] ENDOSCOPY;  Service: Cardiovascular;  Laterality: N/A;  . CARDIOVERSION N/A 06/22/2013   Procedure: CARDIOVERSION;  Surgeon: [name], MD;  Location: [place];  Service: Cardiovascular;  Laterality: N/A;  . CATARACT EXTRACTION Bilateral 2018   with lens implant  . CHOLECYSTECTOMY    . COLONOSCOPY    . ESOPHAGOGASTRODUODENOSCOPY    . IR IMAGING GUIDED PORT INSERTION  05/26/2018  . LYMPH NODE BIOPSY Left 04/27/2018   Procedure: EXCISIONAL BIOPSY OF LEFT CERVICAL LYMPH NODE;  Surgeon: Melida Quitter, MD;  Location: [place];  Service: ENT;  Laterality: Left;  . LYMPH NODE BIOPSY Left 11/23/2018   Procedure: LEFT CERVICAL LYMPH NODE OPEN BIOPSY;  Surgeon: Melida Quitter, MD;  Location: [place];  Service: ENT;  Laterality: Left;  . MENISCUS REPAIR Right 2009  . MITRAL VALVE REPAIR  2002   [facility]  . NM MYOVIEW LTD  07/22/2006   no ischemia  . RADICAL NECK DISSECTION Left 01/15/2019   Procedure: LEFT NECK DISSECTION;  Surgeon: Melida Quitter, MD;  Location: [place];  Service: ENT;  Laterality: Left;  . RIGHT HEART CATH  06/19/2004   normal right heart dynamics. EF 50%  . TEE WITHOUT CARDIOVERSION  02/12/2012   Procedure: TRANSESOPHAGEAL ECHOCARDIOGRAM (TEE);  Surgeon: Pixie Casino, MD;  Location: [facility] ENDOSCOPY;  Service: Cardiovascular;  Laterality: N/A;  . TEE WITHOUT CARDIOVERSION N/A 06/22/2013   Procedure: TRANSESOPHAGEAL ECHOCARDIOGRAM (TEE);  Surgeon: [name], MD;  Location: [place];  Service: Cardiovascular;  Laterality: N/A;  . THYROIDECTOMY N/A 01/15/2019   Procedure: TOTAL THYROIDECTOMY;  Surgeon: Melida Quitter, MD;  Location: [place];  Service: ENT;  Laterality: N/A;    There were no vitals filed for this visit.  Subjective Assessment - 04/06/19 0805    Subjective  My neck is sore after I finish the  exercises. I am not having any pain today. I can not tell that my range of motion is improving.    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, 01/15/19 underwent left neck dissection and thyroidectomy for treatment of papillary thyroid cancer, pt completed 6 months of chemo in July and will begin again around March 2021 after next operation at [facility] to improve swallowing, 12/2000 mitral valve repair, 10/2009 R knee arthroscopic repair of meniscus, 01/2012 cardioversion, 07/2012 retroperitoneal abdominal bleed, 11/2012- coiling of aneurysm- 4 abdominal aneurysms remain, 01/2013 cardioversion, 07/2013 ablation, 2018 cataract srugery, 01/2019 vocal cord/fold injections    Patient Stated Goals  decreased neck tightness    Currently in Pain?  No/denies                       [facility] Adult PT Treatment/Exercise - 04/06/19 0001      Manual Therapy   Manual Therapy  Myofascial release;Soft tissue mobilization;Passive ROM    Soft tissue mobilization  In supine to bilateral neck, holding neck in prolonged stretch to opposite side performed soft tissue mobilization to side of neck especially to bilateral SCMs and scalenes, also along left neck scar to improve mobility    Myofascial Release  along left neck scar using cross hand technique, suboccipital release    Passive ROM  to neck in direction of lateral flexion with prolonged holds to left and right and rotation to left and right                  PT Long Term Goals - 03/29/19 1056      PT LONG TERM GOAL #1   Title  Pt will be able to sleep on R side without increased left sided neck tightness and discomfort.    Time  4    Period  Weeks    Status  New    Target Date  04/26/19      PT LONG TERM GOAL #2   Title  Pt will be able to turn and look over either shoulder at intersection while driving without limitation or discomfort in neck.    Time  4    Period  Weeks    Status  New    Target Date  04/26/19      PT LONG TERM  GOAL #3   Title  Pt will be independent in a home exercise program for continued strengthening and stretching.    Time  4    Period  Weeks    Status  New    Target Date  04/26/19      PT LONG TERM GOAL #4   Title  Pt will report a 75% improvement in tightness around  left neck dissection scar to improve comfort.    Time  4    Period  Weeks    Status  New    Target Date  04/26/19            Plan - 04/06/19 0857    Clinical Impression Statement  Began PROM to neck with focus on lateral flexion and rotation since these are the most limited with prolonged holds. Performed soft tissue mobilization to tight musculature in neck on both sides especially SCMs. Scar massage and myofascial performed to improve overall mobiliity of neck. Pt stated he felt better by end of session. Ecouraged pt to continue with exercises at home and work up to holding stretches for 30 seconds once he is no longer having soreness with 20 seconds.    PT Frequency  1x / week    PT Duration  4 weeks    PT Treatment/Interventions  ADLs/Self Care Home Management;Therapeutic exercise;Manual techniques;Manual lymph drainage;Patient/family education;Scar mobilization;Passive range of motion    PT Next Visit Plan  PROM neck, scar massage    PT Home Exercise Plan  head and neck ROM exercises    Consulted and Agree with Plan of Care  Patient       Patient will benefit from skilled therapeutic intervention in order to improve the following deficits and impairments:  Pain, Increased fascial restricitons, Decreased scar mobility, Decreased range of motion, Decreased strength, Increased edema  Visit Diagnosis: Cervicalgia     Problem List Patient Active Problem List   Diagnosis Date Noted  . Acquired lymphedema 02/22/2019  . Speech and language deficits 02/22/2019  . 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([place]) 11/26/2018  . Lymphoma ([place]) 11/23/2018  . Hyperlipidemia 11/03/2018  . Neck discomfort 09/23/2018  . Other  constipation 08/26/2018  . Pancytopenia, acquired ([place]) 07/28/2018  . 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 Insomnia secondary to chronic pain 10/28/2017  . OSA (obstructive sleep apnea) 10/28/2017  . OSA on CPAP 07/23/2017  . Neuropathy 07/23/2017  . RLS (restless legs syndrome) 07/23/2017  . Gallstones 03/30/2013  . Palpitations 02/16/2013  . 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 Pain in limb-Abdominal 09/21/2012  . Nontraumatic retroperitoneal hematoma, spontaneous while on anticoagulant therapy 08/29/2012  . Acute post-hemorrhagic anemia 08/29/2012  . Acute GI bleeding, spontaneous  08/06/2012  . Atrial flutter, status post TE guided cardioversion November 2013 08/06/2012  . 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/ring by Dr Evelina Dun at [facility] '02 08/06/2012  . Normal coronary arteries at cath 2006 (false positive Nuc) 08/06/2012  . 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 Contrast media allergy 08/06/2012  . Acute renal insufficiency 08/06/2012  . NSVT (nonsustained ventricular tachycardia) ([place]) 08/06/2012  . Hemorrhagic shock- now stable 08/04/2012  . Coagulopathy- chronic anticoagulation with Xarelto 08/04/2012  . Bradycardia- HR 58 NSR on admission 08/04/12 08/04/2012    Allyson Sabal [facility] 04/06/2019, 9:00 AM  [place], [place], [address] Phone: [phone]   Fax:  [phone]  Name: Sean Stanley MRN: [id number] Date of Birth: 07-16-1940  Manus Gunning, PT 04/06/19 9:01 AM

## 2019-04-13 ENCOUNTER — Other Ambulatory Visit: Payer: Self-pay

## 2019-04-13 ENCOUNTER — Ambulatory Visit: Payer: Medicare Other | Admitting: Physical Therapy

## 2019-04-13 ENCOUNTER — Encounter: Payer: Self-pay | Admitting: Physical Therapy

## 2019-04-13 DIAGNOSIS — M6281 Muscle weakness (generalized): Secondary | ICD-10-CM

## 2019-04-13 DIAGNOSIS — M542 Cervicalgia: Secondary | ICD-10-CM

## 2019-04-13 NOTE — Therapy (Signed)
[place], [place], [address] Phone: [phone]   Fax:  [phone]  Physical Therapy Treatment  Patient Details  Name: Sean Stanley MRN: [id number] Date of Birth: 05-Mar-1941 Referring Provider (PT): Alvy Bimler   Encounter Date: 04/13/2019  PT End of Session - 04/13/19 0924    Visit Number  3    Number of Visits  5    Date for PT Re-Evaluation  04/26/19    PT Start Time  0805    PT Stop Time  0915    PT Time Calculation (min)  70 min    Activity Tolerance  Patient tolerated treatment well    Behavior During Therapy  [facility] for tasks assessed/performed       Past Medical History:  Diagnosis Date  . 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 Atrial flutter ([place])   . BPH (benign prostatic hyperplasia)   . Cancer ([place])    melanoma on lower right back and left chest - surgically removed and cleared  . Dysrhythmia   . 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 Headache    migraine- not current  . 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 History of blood transfusion   . Hypercholesteremia   . Hypercholesterolemia   . Lymphoma ([place]) 04/2018  . Mitral valve disease    annuloplasty 2002 [facility]  . Neuropathy   . Neuropathy of both feet    pt states due to exposure to [employer]  . OSA (obstructive sleep apnea)    does not use cpap, Dr. Maxwell Caul told him he had improved  . Pneumonia   . Thoracic ascending aortic aneurysm (HCC)    4.5 cm 03/2018 CT  . Thyroid cancer [facility])     Past Surgical History:  Procedure  Laterality Date  . ABDOMINAL ANGIOGRAM  08/05/12  . aneurysm repair    . CARDIOVERSION  02/12/2012   Procedure: CARDIOVERSION;  Surgeon: Pixie Casino, MD;  Location: [facility] ENDOSCOPY;  Service: Cardiovascular;  Laterality: N/A;  . CARDIOVERSION N/A 06/22/2013   Procedure: CARDIOVERSION;  Surgeon: [name], MD;  Location: [place];  Service: Cardiovascular;  Laterality: N/A;  . CATARACT EXTRACTION Bilateral 2018   with lens implant  . CHOLECYSTECTOMY    . COLONOSCOPY    . ESOPHAGOGASTRODUODENOSCOPY    . IR IMAGING GUIDED PORT INSERTION  05/26/2018  . LYMPH NODE BIOPSY Left 04/27/2018   Procedure: EXCISIONAL BIOPSY OF LEFT CERVICAL LYMPH NODE;  Surgeon: Melida Quitter, MD;  Location: [place];  Service: ENT;  Laterality: Left;  . LYMPH NODE BIOPSY Left 11/23/2018   Procedure: LEFT CERVICAL LYMPH NODE OPEN BIOPSY;  Surgeon: Melida Quitter, MD;  Location: [place];  Service: ENT;  Laterality: Left;  . MENISCUS REPAIR Right 2009  . MITRAL VALVE REPAIR  2002   [facility]  . NM MYOVIEW LTD  07/22/2006   no ischemia  . RADICAL NECK DISSECTION Left 01/15/2019   Procedure: LEFT NECK DISSECTION;  Surgeon: Melida Quitter, MD;  Location: [place];  Service: ENT;  Laterality: Left;  . RIGHT HEART CATH  06/19/2004   normal right heart dynamics. EF 50%  . TEE WITHOUT CARDIOVERSION  02/12/2012   Procedure: TRANSESOPHAGEAL ECHOCARDIOGRAM (TEE);  Surgeon: Pixie Casino, MD;  Location: [facility] ENDOSCOPY;  Service: Cardiovascular;  Laterality: N/A;  . TEE WITHOUT CARDIOVERSION N/A 06/22/2013   Procedure: TRANSESOPHAGEAL ECHOCARDIOGRAM (TEE);  Surgeon: [name], MD;  Location: [place];  Service: Cardiovascular;  Laterality: N/A;  . THYROIDECTOMY N/A 01/15/2019   Procedure: TOTAL THYROIDECTOMY;  Surgeon: Melida Quitter, MD;  Location: [place];  Service: ENT;  Laterality: N/A;    There were no vitals filed for this visit.  Subjective Assessment - 04/13/19 0806    Subjective  My neck felt better after last  time and it lasted for a few hours. I have tried to do some massage at home. I do the stretches for 20 seconds x 5 and when I finish I have muscle soreness.    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, 01/15/19 underwent left neck dissection and thyroidectomy for treatment of papillary thyroid cancer, pt completed 6 months of chemo in July and will begin again around March 2021 after next operation at [facility] to improve swallowing, 12/2000 mitral valve repair, 10/2009 R knee arthroscopic repair of meniscus, 01/2012 cardioversion, 07/2012 retroperitoneal abdominal bleed, 11/2012- coiling of aneurysm- 4 abdominal aneurysms remain, 01/2013 cardioversion, 07/2013 ablation, 2018 cataract srugery, 01/2019 vocal cord/fold injections    Patient Stated Goals  decreased neck tightness    Currently in Pain?  No/denies                       [facility] Adult PT Treatment/Exercise - 04/13/19 0001      Exercises   Exercises  Other Exercises    Other Exercises   Instructed pt in stretches for the following and issued an HEP through medbridge: scalenes, SCM, upper traps and levator- had pt demonstrate 1 rep of each stretch and gave verbal cues for pt to perform exercise correctly      Manual Therapy   Manual Therapy  Myofascial release;Soft tissue mobilization;Passive ROM    Soft tissue mobilization  In supine to bilateral neck, holding neck in prolonged stretch to opposite side performed soft tissue mobilization to side of neck especially to bilateral SCMs and scalenes, also along left neck scar to improve mobility    Myofascial Release  along left neck scar using cross hand technique, suboccipital release    Passive ROM  to neck in direction of lateral flexion with prolonged holds to left and right and rotation to left and right                  PT Long Term Goals - 03/29/19 1056      PT LONG TERM GOAL #1   Title  Pt will be able to sleep on R side without increased left sided neck  tightness and discomfort.    Time  4    Period  Weeks    Status  New    Target Date  04/26/19      PT LONG TERM GOAL #2   Title  Pt will be able to turn and look over either shoulder at intersection while driving without limitation or discomfort in neck.    Time  4    Period  Weeks    Status  New    Target Date  04/26/19      PT LONG  TERM GOAL #3   Title  Pt will be independent in a home exercise program for continued strengthening and stretching.    Time  4    Period  Weeks    Status  New    Target Date  04/26/19      PT LONG TERM GOAL #4   Title  Pt will report a 75% improvement in tightness around left neck dissection scar to improve comfort.    Time  4    Period  Weeks    Status  New    Target Date  04/26/19            Plan - 04/13/19 [id number]    Clinical Impression Statement  Continued with soft tissue mobilization and scar moblization today as well as passive range of motion to improve neck mobility and decrease tightness. Pt felt improvement after last session and also felt improvement after this session. Instructed pt in new stretches today for scalenes, SCM, upper traps and levator to help pt maintain gains in therapy.    PT Frequency  1x / week    PT Duration  4 weeks    PT Next Visit Plan  PROM neck, scar massage, see how new exercises are going    PT Home Exercise Plan  head and neck ROM exercises,Access Code: [email or web address]    Consulted and Agree with Plan of Care  Patient       Patient will benefit from skilled therapeutic intervention in order to improve the following deficits and impairments:  Pain, Increased fascial restricitons, Decreased scar mobility, Decreased range of motion, Decreased strength, Increased edema  Visit Diagnosis: Cervicalgia  Muscle weakness (generalized)     Problem List Patient Active Problem List   Diagnosis Date Noted  . Acquired lymphedema 02/22/2019  . Speech and language deficits 02/22/2019  . 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([place]) 11/26/2018  . Lymphoma ([place]) 11/23/2018  . Hyperlipidemia 11/03/2018  . Neck discomfort 09/23/2018  . Other constipation 08/26/2018  . Pancytopenia, acquired ([place]) 07/28/2018  . 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 Insomnia secondary to chronic pain 10/28/2017  . OSA (obstructive sleep apnea) 10/28/2017  . OSA on CPAP 07/23/2017  . Neuropathy 07/23/2017  . RLS (restless legs syndrome) 07/23/2017  . Gallstones 03/30/2013  . Palpitations 02/16/2013  . 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 Pain in limb-Abdominal 09/21/2012  . Nontraumatic retroperitoneal hematoma, spontaneous while on anticoagulant therapy 08/29/2012  . Acute post-hemorrhagic anemia 08/29/2012  . Acute GI bleeding, spontaneous  08/06/2012  . Atrial flutter, status post TE guided cardioversion November 2013 08/06/2012  . 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/ring by Dr Evelina Dun at [facility] '02 08/06/2012  . Normal coronary arteries at cath 2006 (false positive Nuc) 08/06/2012  . 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 Contrast media allergy 08/06/2012  . Acute renal insufficiency 08/06/2012  . NSVT (nonsustained ventricular tachycardia) ([place]) 08/06/2012  . Hemorrhagic shock- now stable 08/04/2012  . Coagulopathy- chronic anticoagulation with Xarelto 08/04/2012  . Bradycardia- HR 58 NSR on admission 08/04/12 08/04/2012    Allyson Sabal [facility] 04/13/2019, 9:29 AM  [place], [place], [address] Phone: [phone]   Fax:  [phone]  Name: Sean Stanley MRN: [id number] Date of Birth: 10-Jun-1940  Manus Gunning, PT 04/13/19 9:29 AM

## 2019-04-13 NOTE — Patient Instructions (Signed)
Access Code: [email or web address]  URL: [email or web address]  Date: 04/13/2019  Prepared by: Manus Gunning   Exercises Sternocleidomastoid Stretch - 3 reps - 1 sets - 20-30 sec hold - 3x daily - 7x weekly Seated Scalenes Stretch - 3 reps - 1 sets - 20-30 seconds hold - 3x daily - 7x weekly Seated Upper Trapezius Stretch - 3 reps - 1 sets - 20-30 sec hold - 3x daily - 7x weekly Seated Levator Scapulae Stretch - 3 reps - 1 sets - 20-30 sec hold                            - 3x daily - 7x weekly

## 2019-04-18 ENCOUNTER — Other Ambulatory Visit: Payer: Self-pay | Admitting: Hematology and Oncology

## 2019-04-18 DIAGNOSIS — C8318 Mantle cell lymphoma, lymph nodes of multiple sites: Secondary | ICD-10-CM

## 2019-04-19 ENCOUNTER — Encounter: Payer: Self-pay | Admitting: Hematology and Oncology

## 2019-04-19 ENCOUNTER — Other Ambulatory Visit: Payer: Self-pay

## 2019-04-19 ENCOUNTER — Inpatient Hospital Stay: Payer: Medicare Other

## 2019-04-19 ENCOUNTER — Inpatient Hospital Stay: Payer: Medicare Other | Attending: Hematology and Oncology

## 2019-04-19 ENCOUNTER — Inpatient Hospital Stay (HOSPITAL_BASED_OUTPATIENT_CLINIC_OR_DEPARTMENT_OTHER): Payer: Medicare Other | Admitting: Hematology and Oncology

## 2019-04-19 ENCOUNTER — Ambulatory Visit: Payer: Medicare Other

## 2019-04-19 VITALS — BP 135/63 | HR 62 | Temp 98.2°F | Resp 18 | Ht 73.0 in | Wt 221.0 lb

## 2019-04-19 DIAGNOSIS — R479 Unspecified speech disturbances: Secondary | ICD-10-CM | POA: Diagnosis not present

## 2019-04-19 DIAGNOSIS — F809 Developmental disorder of speech and language, unspecified: Secondary | ICD-10-CM

## 2019-04-19 DIAGNOSIS — C8318 Mantle cell lymphoma, lymph nodes of multiple sites: Secondary | ICD-10-CM

## 2019-04-19 DIAGNOSIS — E559 Vitamin D deficiency, unspecified: Secondary | ICD-10-CM | POA: Diagnosis not present

## 2019-04-19 DIAGNOSIS — Z79899 Other long term (current) drug therapy: Secondary | ICD-10-CM | POA: Insufficient documentation

## 2019-04-19 DIAGNOSIS — M542 Cervicalgia: Secondary | ICD-10-CM | POA: Diagnosis not present

## 2019-04-19 DIAGNOSIS — J383 Other diseases of vocal cords: Secondary | ICD-10-CM | POA: Insufficient documentation

## 2019-04-19 DIAGNOSIS — R203 Hyperesthesia: Secondary | ICD-10-CM | POA: Diagnosis not present

## 2019-04-19 DIAGNOSIS — I890 Lymphedema, not elsewhere classified: Secondary | ICD-10-CM | POA: Insufficient documentation

## 2019-04-19 DIAGNOSIS — D539 Nutritional anemia, unspecified: Secondary | ICD-10-CM | POA: Insufficient documentation

## 2019-04-19 DIAGNOSIS — D61818 Other pancytopenia: Secondary | ICD-10-CM | POA: Diagnosis not present

## 2019-04-19 DIAGNOSIS — R109 Unspecified abdominal pain: Secondary | ICD-10-CM | POA: Diagnosis not present

## 2019-04-19 DIAGNOSIS — C73 Malignant neoplasm of thyroid gland: Secondary | ICD-10-CM

## 2019-04-19 LAB — CBC WITH DIFFERENTIAL (CANCER CENTER ONLY)
Abs Immature Granulocytes: 0.07 10*3/uL (ref 0.00–0.07)
Basophils Absolute: 0 10*3/uL (ref 0.0–0.1)
Basophils Relative: 1 %
Eosinophils Absolute: 0.2 10*3/uL (ref 0.0–0.5)
Eosinophils Relative: 5 %
HCT: 39 % (ref 39.0–52.0)
Hemoglobin: 12.9 g/dL — ABNORMAL LOW (ref 13.0–17.0)
Immature Granulocytes: 2 %
Lymphocytes Relative: 7 %
Lymphs Abs: 0.3 10*3/uL — ABNORMAL LOW (ref 0.7–4.0)
MCH: 30.6 pg (ref 26.0–34.0)
MCHC: 33.1 g/dL (ref 30.0–36.0)
MCV: 92.4 fL (ref 80.0–100.0)
Monocytes Absolute: 0.7 10*3/uL (ref 0.1–1.0)
Monocytes Relative: 19 %
Neutro Abs: 2.4 10*3/uL (ref 1.7–7.7)
Neutrophils Relative %: 66 %
Platelet Count: 147 10*3/uL — ABNORMAL LOW (ref 150–400)
RBC: 4.22 MIL/uL (ref 4.22–5.81)
RDW: 13.6 % (ref 11.5–15.5)
WBC Count: 3.6 10*3/uL — ABNORMAL LOW (ref 4.0–10.5)
nRBC: 0 % (ref 0.0–0.2)

## 2019-04-19 LAB — CMP (CANCER CENTER ONLY)
ALT: 24 U/L (ref 0–44)
AST: 20 U/L (ref 15–41)
Albumin: 4.1 g/dL (ref 3.5–5.0)
Alkaline Phosphatase: 66 U/L (ref 38–126)
Anion gap: 7 (ref 5–15)
BUN: 20 mg/dL (ref 8–23)
CO2: 26 mmol/L (ref 22–32)
Calcium: 8.6 mg/dL — ABNORMAL LOW (ref 8.9–10.3)
Chloride: 107 mmol/L (ref 98–111)
Creatinine: 1.21 mg/dL (ref 0.61–1.24)
GFR, Est AFR Am: >60 mL/min (ref >60)
GFR, Estimated: 57 mL/min — ABNORMAL LOW (ref >60)
Glucose, Bld: 93 mg/dL (ref 70–99)
Potassium: 4.4 mmol/L (ref 3.5–5.1)
Sodium: 140 mmol/L (ref 135–145)
Total Bilirubin: 0.8 mg/dL (ref 0.3–1.2)
Total Protein: 6.1 g/dL — ABNORMAL LOW (ref 6.5–8.1)

## 2019-04-19 MED ORDER — HEPARIN SOD (PORK) LOCK FLUSH 100 UNIT/ML IV SOLN
250.0000 [IU] | Freq: Once | INTRAVENOUS | Status: DC
  Filled 2019-04-19: qty 5

## 2019-04-19 MED ORDER — SODIUM CHLORIDE 0.9% FLUSH
10.0000 mL | Freq: Once | INTRAVENOUS | Status: DC
  Filled 2019-04-19: qty 10

## 2019-04-19 NOTE — Assessment & Plan Note (Signed)
She has minimum lymphedema on exam I advised him to continue neck massages and exercises at home He has physical therapy appointment

## 2019-04-19 NOTE — Assessment & Plan Note (Signed)
The patient had numerous complications after surgery, related to vocal cord dysfunction/paralysis affecting his speech and swallowing He also have hyperesthesia on the left side of his face and neck after surgery and some mild lymphedema and restricted of neck movement He has completed radioactive iodine and is currently taking Synthroid I would defer to his endocrinologist for management

## 2019-04-19 NOTE — Assessment & Plan Note (Signed)
The patient have vocal cord paralysis with planned surgery on February 11 I will defer to ENT for further management

## 2019-04-19 NOTE — Assessment & Plan Note (Signed)
He has mild hypocalcemia I will check serum vitamin D in the next visit

## 2019-04-19 NOTE — Assessment & Plan Note (Signed)
I explained to the patient and his wife the rationale of delaying maintenance rituximab until he has completed surgery to the neck, to avoid unnecessary complications  I recommend PET CT scan early March for new baseline and then he can start his maintenance rituximab the day after we review test results His CBC is unremarkable and he continues to have signs of lymphocyte depletion He still has rare occasional abdominal discomfort and he is concerned about disease recurrence although examination today is benign

## 2019-04-19 NOTE — Assessment & Plan Note (Signed)
He has symptoms suggestive of hyperesthesia on the left side related to prior surgery He does not need pain management for now

## 2019-04-19 NOTE — Progress Notes (Signed)
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[facility]) I explained to the patient and his wife the rationale of delaying maintenance rituximab until he has completed surgery to the neck, to avoid unnecessary complications  I recommend PET CT scan early March for new baseline and then he can start his maintenance rituximab the day after we review test results His CBC is unremarkable and he continues to have signs of lymphocyte depletion He still has rare occasional abdominal discomfort and he is concerned about disease recurrence although examination today is benign  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[facility]) The patient had numerous complications after surgery, related to vocal cord dysfunction/paralysis affecting his speech and swallowing He also have hyperesthesia on the left side of his face and neck after surgery and some mild lymphedema and restricted of neck movement He has completed radioactive iodine and is currently taking Synthroid I would defer to his endocrinologist for management  Deficiency anemia He is noted to have mild intermittent pancytopenia I will check serum vitamin B12 in his next visit  Hypocalcemia He has mild hypocalcemia I will check serum vitamin D in the next visit  Acquired lymphedema She has minimum lymphedema on exam I advised him to continue neck massages and exercises at home He has physical therapy appointment  Speech and language deficits The patient have vocal cord paralysis with planned surgery on February 11 I will defer to ENT for further management   Neck discomfort He has symptoms suggestive of hyperesthesia on the left side related to prior surgery He does not need pain management for  now   Orders Placed This Encounter  Procedures  . NM PET Image Restag (PS) Skull Base To Thigh    Standing Status:   Future    Standing Expiration Date:   04/18/2020    Order Specific Question:   If indicated for the ordered procedure, I authorize the administration of a radiopharmaceutical per Radiology protocol    Answer:   Yes    Order Specific Question:   Preferred imaging location?    Answer:   [facility]    Order Specific Question:   Radiology Contrast Protocol - do NOT remove file path    Answer:   [email or web address]  . Vitamin B12    Standing Status:   Future    Standing Expiration Date:   05/23/2020  . VITAMIN D 25 Hydroxy (Vit-D Deficiency, Fractures)    Standing Status:   Future    Standing Expiration Date:   04/18/2020    All questions were answered. The patient knows to call the clinic with any problems, questions or concerns. The total time spent in the appointment was 30 minutes encounter with patients including review of chart and various tests results, discussions about plan of care and coordination of care plan   Heath Lark, MD 04/19/2019 10:17 AM  INTERVAL HISTORY: Please see below for problem oriented charting. He returns for further follow-up His wife, [name], is also available to collaborate history over the telephone He has been doing physical therapy with neck massages After neck massages, it usually causes some hyperesthesia on the left side of his face and scalp but not to the degree of calling it painful He also has dysphagia and had choked in the past but  with careful swallowing, no recent aspiration He is scheduled to return to [facility] for surgery on February 11 to fix his vocal cord paralysis He has completed radioactive iodine treatment and is currently on Synthroid He is concerned about recurrence of lymphoma because he had recent abdominal pain that started after radioactive iodine but that has since resolved No new  lymphadenopathy No recent changes in appetite or weight loss or changes in bowel habits  SUMMARY OF ONCOLOGIC HISTORY: Oncology History Overview Note  MIPI score 7.5 high risk   Mantle cell lymphoma ([place])  06/13/2017 Imaging   Ct imaging at [facility] 1. 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 2. 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 3. Slight reduction in size of right retroperitoneal evolving hematoma.   03/30/2018 Imaging   Ct neck 1. Extensive adenopathy on the left. The largest node is a level 1/submandibular node measuring 38 x 24 x 22 mm. Largest level 2 node measures 3.2 x 2 x 2 cm. Numerous other smaller but round lymph nodes throughout the left neck in the level 2 through level 4 region. The largest level 5 node measures 3.4 x 3.4 x 2.3 cm with a transverse diameter of 2.3 cm. This contains some internal calcifications. There are numerous other pathologic nodes in the left supraclavicular to axillary region. This pattern of disease could be due to extensive left neck metastatic disease from unknown primary, lymphoma, or other systemic malignancy. 2. No evidence of mucosal or submucosal lesion. 3. Diameter of the ascending aorta is 4.5 cm. Recommend annual imaging followup by CTA or MRA. Aortic Atherosclerosis (ICD10-I70.0).    04/10/2018 Pathology Results   Left zone 1 neck mass, [name], but the cell block includes scant lymphocytes. Immunohistochemical stains were attempted showing  positive staining for CD20 with rare staining for CD3. Cytokeratin AE1/AE3 is negative. S100 shows high nonspecific background staining but no specifically diagnostic cellular staining. Overall, the findings indicate an atypical lymphoid proliferation but are too limited for definitive diagnosis. Excisional biopsy with flow cytometry is recommended.   04/10/2018 Procedure   He was ENT who performed FNA of neck LN   04/27/2018 Pathology Results   Lymph node for lymphoma, [name], [name], [name], [name], [name], [name], [name], the features are consistent with a mantle cell lymphoma   05/13/2018 PET scan   1. Hypermetabolic left cervical lymphadenopathy, left axillary lymphadenopathy, mediastinal / right hilar lymphadenopathy, right external iliac lymphadenopathy, and subcentimeter left external iliac and left inguinal lymph nodes, [name], [name], that is  similar to slightly above liver FDG activity, without focal lesions, that may represent lymphomatous involvement of the spleen.   05/21/2018 Cancer Staging   Staging form: Hodgkin and Non-Hodgkin Lymphoma, AJCC 8th Edition - Clinical: Stage III - Signed by Heath Lark, MD on 05/21/2018   05/26/2018 Procedure   Ultrasound and fluoroscopically guided  right internal jugular single lumen power port catheter insertion. Tip in the SVC/RA junction. Catheter ready for use.    06/01/2018 -  Chemotherapy   The patient had Bendamustine and Rituximab for chemotherapy treatment   08/24/2018 PET scan   1. Partial response to therapy with complete resolution of size and metabolic activity of the dominant LEFT submandibular node (level 1) and LEFT axillary nodes. 2. Persistent and increased metabolic activity of left level 3 lymph nodes ( [place] 4). Nodes are partially calcified and similar size. 3. No new metastatic disease. 4. Normal spleen and bone marrow.   11/09/2018 PET scan   IMPRESSION: 1. Mild response to therapy of hypermetabolic left cervical nodes. ([place]) 4. 2. No new or progressive disease. 3. Coronary artery atherosclerosis. Aortic Atherosclerosis (ICD10-I70.0). 4. Trace cul-de-sac fluid, similar.   12/18/2018 Imaging   1. 4 mm solid nodule in the right lower lobe, which is technically indeterminate. Recommend attention on follow-up per clinical protocol. 2. Small calcified nodule adjacent to the left lobe of the thyroid, which may represent thyroid nodule or calcified lymph node. Recommend correlation with thyroid ultrasound. 3. The ascending aorta appears mildly dilated, measuring up to 4.2cm. 4. Decreased left axillary lymphadenopathy.   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([place])  11/23/2018 Pathology Results   Lymph node for lymphoma, Left zone 3 - PAPILLARY THYROID CARCINOMA. - SEE MICROSCOPIC DESCRIPTION. Microscopic Comment The specimen consists mostly of encapsulated papillary thyroid carcinoma. There are portions of lymphoid tissue attached to the periphery of the specimen and in the adjacent adipose tissue which suggests that this may represent papillary carcinoma metastatic to a lymph node.   11/23/2018 Surgery   PREOPERATIVE DIAGNOSIS:  Mantle cell lymphoma and cervical lymphadenopathy.   POSTOPERATIVE DIAGNOSIS:   Mantle cell lymphoma and cervical lymphadenopathy.   PROCEDURE:  Excisional biopsy of left cervical lymph nodes.     01/15/2019 Pathology Results   A. SOFT TISSUE, NECK, ADJACENT TO LEFT RECURRENT NERVE, BIOPSY: - Papillary thyroid carcinoma. B. 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 C. ADDITIONAL LEFT LEVEL 4 TISSUE, EXCISION: - There is no evidence of carcinoma in 2 of 2 lymph nodes (0/2). D. TOTAL THYROIDECTOMY: - Papillary thyroid carcinoma, 0.8 cm. - Carcinoma is broadly present at an inked tissue edge. - See oncology table below. E. LEFT LEVEL 6 TISSUE, EXCISION: - Benign fibroadipose tissue. - There is no evidence of malignancy. F. LEFT RECURRENT NERVE AND SURROUNDING TISSUE, EXCISION: - Metastatic papillary thyroid carcinoma in 3 of 3 lymph nodes (3/3).  THYROID GLAND: Procedure: Thyroidectomy Tumor Focality: Unifocal Tumor Site: Left lobe Tumor Size: 0.8 cm Histologic Type: 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Margins: Carcinoma is broadly present at a black inked tissue edge. Angioinvasion: Not identified Lymphatic Invasion: Not identified Extrathyroidal extension: Not definitively identified Regional Lymph Nodes: Number of Lymph Nodes Involved: 8 Nodal Levels Involved: 2, 3, 4 Size of Largest Metastatic Deposit: 1.1 cm Extranodal Extension (ENE): Present Number of Lymph Nodes Examined: 17 Nodal Levels Examined: 2, 3, 4, 6 Pathologic Stage Classification (pTNM, AJCC 8th Edition): pT1a, pN1b   01/15/2019 Surgery   PREOPERATIVE DIAGNOSIS:  Papillary  carcinoma of the thyroid with left cervical metastasis.   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   PROCEDURE:  Total thyroidectomy and left modified radical neck dissection.   SURGEON:  Melida Quitter, MD     FINDINGS:  There was a firm nodule in zone IIB on the left side and also some firm  tissue and adherence to the jugular vein in zone III region requiring removal of a portion of the vein.  There was a firm nodule in the left thyroid lobe that extended posterior to the gland, adhering to the trachea, and encompassing the recurrent laryngeal nerve.  Tissue was sent for frozen section from adjacent to the nerve, demonstrating papillary carcinoma.  The nerve stimulated distal to the mass but not proximal to the mass; thus, the nerve was removed with the mass.  The left-sided parathyroid glands were visualized, as was the right superior parathyroid gland.  The right recurrent nerve was kept safe and stimulated well at the end of the case.     REVIEW OF SYSTEMS:   Constitutional: Denies fevers, chills or abnormal weight loss Eyes: Denies blurriness of vision Ears, nose, mouth, throat, and face: Denies mucositis or sore throat Respiratory: Denies cough, dyspnea or wheezes Cardiovascular: Denies palpitation, chest discomfort or lower extremity swelling Gastrointestinal:  Denies nausea, heartburn or change in bowel habits Skin: Denies abnormal skin rashes Lymphatics: Denies new lymphadenopathy or easy bruising Neurological:Denies numbness, tingling or new weaknesses Behavioral/Psych: Mood is stable, no new changes  All other systems were reviewed with the patient and are negative.  I have reviewed the past medical history, past surgical history, social history and family history with the patient and they are unchanged from previous note.  ALLERGIES:  is allergic to aspirin; levaquin [levofloxacin in d5w]; nickel; other; statins; allopurinol; and ampicillin.  MEDICATIONS:  Current Outpatient Medications  Medication Sig Dispense Refill  . acetaminophen (TYLENOL) 500 MG tablet Take 1,000 mg by mouth daily as needed for moderate pain.    Marland Kitchen acyclovir (ZOVIRAX) 400 MG tablet Take 1 tablet (400 mg total) by mouth daily at 12 noon. 90 tablet 3  . alfuzosin (UROXATRAL) 10 MG 24 hr tablet  Take 10 mg by mouth at bedtime.     Marland Kitchen buPROPion (WELLBUTRIN XL) 150 MG 24 hr tablet TAKE 1 TABLET DAILY (Patient taking differently: Take 150 mg by mouth daily. ) 90 tablet 3  . Cholecalciferol (VITAMIN D) 2000 units tablet Take 2,000 Units by mouth at bedtime.     Marland Kitchen ezetimibe (ZETIA) 10 MG tablet Take 10 mg by mouth at bedtime.    . finasteride (PROSCAR) 5 MG tablet Take 5 mg by mouth at bedtime.     Marland Kitchen levothyroxine (SYNTHROID) 100 MCG tablet Take 1 tablet (100 mcg total) by mouth daily. 30 tablet 6  . lidocaine-prilocaine (EMLA) cream Apply to affected area once (Patient not taking: Reported on 01/07/2019) 30 g 3  . metoprolol succinate (TOPROL-XL) 25 MG 24 hr tablet Take 12.5 mg by mouth daily.     Vladimir Faster Glycol-Propyl Glycol (SYSTANE HYDRATION PF OP) Place 1 drop into both eyes 3 (three) times daily as needed (dry eyes).      No current facility-administered medications for this visit.    PHYSICAL EXAMINATION: ECOG PERFORMANCE STATUS: 1 - Symptomatic but completely ambulatory  Vitals:   04/19/19 0851  BP: 135/63  Pulse: 62  Resp: 18  Temp: 98.2 F (36.8 C)  SpO2: 97%   Filed  Weights   04/19/19 0851  Weight: 221 lb (100.2 kg)    GENERAL:alert, no distress and comfortable SKIN: skin color, texture, turgor are normal, no rashes or significant lesions EYES: normal, Conjunctiva are pink and non-injected, sclera clear OROPHARYNX:no exudate, no erythema and lips, buccal mucosa, and tongue normal  NECK: Well-healed surgical scar.  No evidence of lymphedema LYMPH:  no palpable lymphadenopathy in the cervical, axillary or inguinal LUNGS: clear to auscultation and percussion with normal breathing effort HEART: regular rate & rhythm and no murmurs and no lower extremity edema ABDOMEN:abdomen soft, non-tender and normal bowel sounds Musculoskeletal:no cyanosis of digits and no clubbing  NEURO: alert & oriented x 3 with speech dysphonia, no focal motor/sensory  deficits  LABORATORY DATA:  I have reviewed the data as listed    Component Value Date/Time   NA 140 04/19/2019 0801   K 4.4 04/19/2019 0801   CL 107 04/19/2019 0801   CO2 26 04/19/2019 0801   GLUCOSE 93 04/19/2019 0801   BUN 20 04/19/2019 0801   CREATININE 1.21 04/19/2019 0801   CALCIUM 8.6 (L) 04/19/2019 0801   PROT 6.1 (L) 04/19/2019 0801   PROT 6.6 07/23/2017 1504   ALBUMIN 4.1 04/19/2019 0801   AST 20 04/19/2019 0801   ALT 24 04/19/2019 0801   ALKPHOS 66 04/19/2019 0801   BILITOT 0.8 04/19/2019 0801   GFRNONAA 57 (L) 04/19/2019 0801   GFRAA >60 04/19/2019 0801    No results found for: SPEP, UPEP  Lab Results  Component Value Date   WBC 3.6 (L) 04/19/2019   NEUTROABS 2.4 04/19/2019   HGB 12.9 (L) 04/19/2019   HCT 39.0 04/19/2019   MCV 92.4 04/19/2019   PLT 147 (L) 04/19/2019      Chemistry      Component Value Date/Time   NA 140 04/19/2019 0801   K 4.4 04/19/2019 0801   CL 107 04/19/2019 0801   CO2 26 04/19/2019 0801   BUN 20 04/19/2019 0801   CREATININE 1.21 04/19/2019 0801      Component Value Date/Time   CALCIUM 8.6 (L) 04/19/2019 0801   ALKPHOS 66 04/19/2019 0801   AST 20 04/19/2019 0801   ALT 24 04/19/2019 0801   BILITOT 0.8 04/19/2019 0801       RADIOGRAPHIC STUDIES: I have personally reviewed the radiological images as listed and agreed with the findings in the report. DG Chest 2 View  Result Date: 03/24/2019 CLINICAL DATA:  Right anterolateral chest pain for the past 2-3 weeks. EXAM: CHEST - 2 VIEW COMPARISON:  Chest x-ray dated January 12, 2019. FINDINGS: Unchanged right chest wall port catheter. The heart size and mediastinal contours are within normal limits. Atherosclerotic calcification of the aortic arch. Normal pulmonary vascularity. Unchanged chronic pleural thickening and scarring in the peripheral right mid lung. No focal consolidation, pleural effusion, or pneumothorax. No acute osseous abnormality. Chronic cerclage wires along the  right anterior fourth rib. Unchanged embolization coils in the left upper quadrant. IMPRESSION: No active cardiopulmonary disease. Electronically Signed   By: Titus Dubin M.D.   On: 03/24/2019 10:13   DG Ribs Unilateral Right  Result Date: 03/24/2019 CLINICAL DATA:  Right anterolateral chest pain for the past 2-3 weeks. No injury. EXAM: RIGHT RIBS - 2 VIEW COMPARISON:  Chest x-ray dated January 12, 2019. FINDINGS: No fracture or other bone lesions are seen involving the ribs. Chronic cerclage wires along the right anterior fourth rib. IMPRESSION: Negative. Electronically Signed   By: Orville Govern.D.  On: 03/24/2019 10:15   NM Whole Body I131 Scan S/P Ca Rx  Result Date: 03/22/2019 CLINICAL DATA:  Status post I 131 therapy for thyroid cancer. EXAM: NUCLEAR MEDICINE I-131 POST THERAPY WHOLE BODY SCAN TECHNIQUE: The patient received 124.7 mCi I-131 sodium iodide for the treatment of thyroid cancer within the past 10 days. The patient returns today, and whole body scanning was performed in the anterior and posterior projections. COMPARISON:  None. FINDINGS: Moderate uptake noted in the thyroid bed consistent with residual thyroid tissue. No abnormal areas of uptake are identified in the neck, chest, abdomen or pelvis to suggest metastatic disease. IMPRESSION: 1. Uptake in the thyroid bed consistent with residual thyroid tissue. 2. No areas of abnormal uptake to suggest metastatic disease. Electronically Signed   By: Marijo Sanes M.D.   On: 03/22/2019 10:52

## 2019-04-19 NOTE — Assessment & Plan Note (Signed)
He is noted to have mild intermittent pancytopenia I will check serum vitamin B12 in his next visit

## 2019-04-20 ENCOUNTER — Ambulatory Visit: Payer: Medicare Other | Admitting: Physical Therapy

## 2019-04-20 ENCOUNTER — Encounter: Payer: Self-pay | Admitting: Physical Therapy

## 2019-04-20 ENCOUNTER — Telehealth: Payer: Self-pay | Admitting: Hematology and Oncology

## 2019-04-20 DIAGNOSIS — M542 Cervicalgia: Secondary | ICD-10-CM | POA: Diagnosis not present

## 2019-04-20 DIAGNOSIS — M6281 Muscle weakness (generalized): Secondary | ICD-10-CM | POA: Diagnosis not present

## 2019-04-20 NOTE — Therapy (Signed)
[place], [place], [address] Phone: [phone]   Fax:  [phone]  Physical Therapy Treatment  Patient Details  Name: Sean Stanley MRN: [id number] Date of Birth: Oct 27, 1940 Referring Provider (PT): Alvy Bimler   Encounter Date: 04/20/2019  PT End of Session - 04/20/19 0850    Visit Number  4    Number of Visits  5    Date for PT Re-Evaluation  04/26/19    PT Start Time  0803    PT Stop Time  0849    PT Time Calculation (min)  46 min    Activity Tolerance  Patient tolerated treatment well    Behavior During Therapy  [facility] for tasks assessed/performed       Past Medical History:  Diagnosis Date  . 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 Atrial flutter ([place])   . BPH (benign prostatic hyperplasia)   . Cancer ([place])    melanoma on lower right back and left chest - surgically removed and cleared  . Dysrhythmia   . 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 Headache    migraine- not current  . 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 History of blood transfusion   . Hypercholesteremia   . Hypercholesterolemia   . Lymphoma ([place]) 04/2018  . Mitral valve disease    annuloplasty 2002 [facility]  . Neuropathy   . Neuropathy of both feet    pt states due to exposure to [employer]  . OSA (obstructive sleep apnea)    does not use cpap, Dr. Maxwell Caul told him he had improved  . Pneumonia   . Thoracic ascending aortic aneurysm (HCC)    4.5 cm 03/2018 CT  . Thyroid cancer [facility])     Past Surgical History:  Procedure  Laterality Date  . ABDOMINAL ANGIOGRAM  08/05/12  . aneurysm repair    . CARDIOVERSION  02/12/2012   Procedure: CARDIOVERSION;  Surgeon: Pixie Casino, MD;  Location: [facility] ENDOSCOPY;  Service: Cardiovascular;  Laterality: N/A;  . CARDIOVERSION N/A 06/22/2013   Procedure: CARDIOVERSION;  Surgeon: [name], MD;  Location: [place];  Service: Cardiovascular;  Laterality: N/A;  . CATARACT EXTRACTION Bilateral 2018   with lens implant  . CHOLECYSTECTOMY    . COLONOSCOPY    . ESOPHAGOGASTRODUODENOSCOPY    . IR IMAGING GUIDED PORT INSERTION  05/26/2018  . LYMPH NODE BIOPSY Left 04/27/2018   Procedure: EXCISIONAL BIOPSY OF LEFT CERVICAL LYMPH NODE;  Surgeon: Melida Quitter, MD;  Location: [place];  Service: ENT;  Laterality: Left;  . LYMPH NODE BIOPSY Left 11/23/2018   Procedure: LEFT CERVICAL LYMPH NODE OPEN BIOPSY;  Surgeon: Melida Quitter, MD;  Location: [place];  Service: ENT;  Laterality: Left;  . MENISCUS REPAIR Right 2009  . MITRAL VALVE REPAIR  2002   [facility]  . NM MYOVIEW LTD  07/22/2006   no ischemia  . RADICAL NECK DISSECTION Left 01/15/2019   Procedure: LEFT NECK DISSECTION;  Surgeon: Melida Quitter, MD;  Location: [place];  Service: ENT;  Laterality: Left;  . RIGHT HEART CATH  06/19/2004   normal right heart dynamics. EF 50%  . TEE WITHOUT CARDIOVERSION  02/12/2012   Procedure: TRANSESOPHAGEAL ECHOCARDIOGRAM (TEE);  Surgeon: Pixie Casino, MD;  Location: [facility] ENDOSCOPY;  Service: Cardiovascular;  Laterality: N/A;  . TEE WITHOUT CARDIOVERSION N/A 06/22/2013   Procedure: TRANSESOPHAGEAL ECHOCARDIOGRAM (TEE);  Surgeon: [name], MD;  Location: [place];  Service: Cardiovascular;  Laterality: N/A;  . THYROIDECTOMY N/A 01/15/2019   Procedure: TOTAL THYROIDECTOMY;  Surgeon: Melida Quitter, MD;  Location: [place];  Service: ENT;  Laterality: N/A;    There were no vitals filed for this visit.  Subjective Assessment - 04/20/19 0805    Subjective  I had that pain from my the top of  my head to around my ear lobe after last session. It lasted for a little while. It has not happened again since that day and I have been doing my stretches.    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, 01/15/19 underwent left neck dissection and thyroidectomy for treatment of papillary thyroid cancer, pt completed 6 months of chemo in July and will begin again around March 2021 after next operation at [facility] to improve swallowing, 12/2000 mitral valve repair, 10/2009 R knee arthroscopic repair of meniscus, 01/2012 cardioversion, 07/2012 retroperitoneal abdominal bleed, 11/2012- coiling of aneurysm- 4 abdominal aneurysms remain, 01/2013 cardioversion, 07/2013 ablation, 2018 cataract srugery, 01/2019 vocal cord/fold injections    Patient Stated Goals  decreased neck tightness    Currently in Pain?  No/denies         [facility] PT Assessment - 04/20/19 0001      AROM   Cervical Flexion  48    Cervical Extension  45    Cervical - Right Side Bend  25   after PROM and stretching: 45   Cervical - Left Side Bend  36   after PROM and stretching: 45   Cervical - Right Rotation  50% limited    Cervical - Left Rotation  60% limited                   [facility] Adult PT Treatment/Exercise - 04/20/19 0001      Manual Therapy   Soft tissue mobilization  In supine to bilateral neck, holding neck in prolonged stretch to opposite side performed soft tissue mobilization to side of neck especially to bilateral SCMs and scalenes, also along left neck scar to improve mobility    Myofascial Release  along left neck scar using cross hand technique, suboccipital release    Passive ROM  to neck in direction of lateral flexion with prolonged holds to left and right and rotation to left and right, AROM nearly doubled in direction of lateral flexion in both directions after PROM                  PT Long Term Goals - 03/29/19 1056      PT LONG TERM GOAL #1   Title  Pt will be able to sleep on R  side without increased left sided neck tightness and discomfort.    Time  4    Period  Weeks    Status  New    Target Date  04/26/19      PT LONG TERM GOAL #2   Title  Pt will be able to turn and look over either shoulder at intersection while driving without limitation or discomfort in neck.    Time  4    Period  Weeks    Status  New    Target Date  04/26/19      PT LONG TERM GOAL #3   Title  Pt will be independent in a home exercise program for continued strengthening and stretching.    Time  4    Period  Weeks    Status  New    Target Date  04/26/19      PT LONG TERM GOAL #4   Title  Pt will report a 75% improvement in tightness around left neck dissection scar to improve comfort.    Time  4    Period  Weeks    Status  New    Target Date  04/26/19            Plan - 04/20/19 0853    Clinical Impression Statement  Pt has been doing new stretches added last week at home but feels the original stretches have been most helpful. Measured AROM today at beginning of session and right lateral cervical flexion was most limited. Remeasured following PROM and stretching and lateral flexion in both directions improved to 45 degrees. Encouraged pt to continue to do stretches at home and pushing in to stretches just a little so they were slightly uncomfortable but not painful.    Comorbidities  heart problems (atrial fib with cardioversions), abdominal aneuryms    PT Frequency  1x / week    PT Duration  4 weeks    PT Treatment/Interventions  ADLs/Self Care Home Management;Therapeutic exercise;Manual techniques;Manual lymph drainage;Patient/family education;Scar mobilization;Passive range of motion    PT Next Visit Plan  PROM neck, scar massage, see how new exercises are going    PT Home Exercise Plan  head and neck ROM exercises,Access Code: [email or web address]    Consulted and Agree with Plan of Care  Patient       Patient will benefit from skilled therapeutic intervention in order to improve  the following deficits and impairments:  Pain, Increased fascial restricitons, Decreased scar mobility, Decreased range of motion, Decreased strength, Increased edema  Visit Diagnosis: Cervicalgia     Problem List Patient Active Problem List   Diagnosis Date Noted  . Deficiency anemia 04/19/2019  . Vitamin D deficiency 04/19/2019  . Hypocalcemia 04/19/2019  . Acquired lymphedema 02/22/2019  . Speech and language deficits 02/22/2019  . 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([place]) 11/26/2018  . Lymphoma ([place]) 11/23/2018  . Hyperlipidemia 11/03/2018  . Neck discomfort 09/23/2018  . Other constipation 08/26/2018  . Pancytopenia, acquired ([place]) 07/28/2018  . 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 Insomnia secondary to chronic pain 10/28/2017  . OSA (obstructive sleep apnea) 10/28/2017  . OSA on CPAP 07/23/2017  . Neuropathy 07/23/2017  . RLS (restless legs syndrome) 07/23/2017  . Gallstones 03/30/2013  . Palpitations 02/16/2013  . 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 Pain in limb-Abdominal 09/21/2012  . Nontraumatic retroperitoneal hematoma, spontaneous while on anticoagulant therapy 08/29/2012  . Acute post-hemorrhagic anemia 08/29/2012  . Acute GI bleeding, spontaneous  08/06/2012  . Atrial flutter, status post TE guided cardioversion November 2013 08/06/2012  . 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/ring by Dr Evelina Dun at [facility] '02 08/06/2012  . Normal coronary arteries at cath 2006 (false positive Nuc) 08/06/2012  . 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 Contrast media allergy 08/06/2012  . Acute renal insufficiency 08/06/2012  . NSVT (nonsustained ventricular tachycardia) ([place]) 08/06/2012  . Hemorrhagic shock- now stable 08/04/2012  .  Coagulopathy- chronic anticoagulation with Xarelto 08/04/2012  . Bradycardia- HR 58 NSR on admission 08/04/12 08/04/2012    Allyson Sabal [facility] 04/20/2019, 8:56 AM  [place], [place], [address] Phone: [phone]   Fax:  [phone]  Name: Sean Stanley MRN: [id number] Date of Birth: [birth year]  Manus Gunning, PT 04/20/19 8:56 AM

## 2019-04-20 NOTE — Telephone Encounter (Signed)
Talk with patient regarding march

## 2019-04-23 DIAGNOSIS — N400 Enlarged prostate without lower urinary tract symptoms: Secondary | ICD-10-CM | POA: Diagnosis not present

## 2019-04-23 DIAGNOSIS — E7800 Pure hypercholesterolemia, unspecified: Secondary | ICD-10-CM | POA: Diagnosis not present

## 2019-04-27 ENCOUNTER — Ambulatory Visit: Payer: Medicare Other | Attending: Hematology and Oncology | Admitting: Physical Therapy

## 2019-04-27 ENCOUNTER — Encounter: Payer: Self-pay | Admitting: Physical Therapy

## 2019-04-27 ENCOUNTER — Other Ambulatory Visit: Payer: Self-pay

## 2019-04-27 DIAGNOSIS — M542 Cervicalgia: Secondary | ICD-10-CM

## 2019-04-27 DIAGNOSIS — M6281 Muscle weakness (generalized): Secondary | ICD-10-CM | POA: Insufficient documentation

## 2019-04-27 NOTE — Therapy (Signed)
[place], [place], [address] Phone: [phone]   Fax:  [phone]  Physical Therapy Treatment  Patient Details  Name: Sean Stanley MRN: [id number] Date of Birth: September 14, 1940 Referring Provider (PT): Alvy Bimler   Encounter Date: 04/27/2019  PT End of Session - 04/27/19 0850    Visit Number  5    Number of Visits  6    Date for PT Re-Evaluation  06/03/19    PT Start Time  0807    PT Stop Time  0850    PT Time Calculation (min)  43 min    Activity Tolerance  Patient tolerated treatment well    Behavior During Therapy  [facility] for tasks assessed/performed       Past Medical History:  Diagnosis Date  . 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 Atrial flutter ([place])   . BPH (benign prostatic hyperplasia)   . Cancer ([place])    melanoma on lower right back and left chest - surgically removed and cleared  . Dysrhythmia   . 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 Headache    migraine- not current  . 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 History of blood transfusion   . Hypercholesteremia   . Hypercholesterolemia   . Lymphoma ([place]) 04/2018  . Mitral valve disease    annuloplasty 2002 [facility]  . Neuropathy   . Neuropathy of both feet    pt states due to exposure to [employer]  . OSA (obstructive sleep apnea)    does not use cpap, Dr. Maxwell Caul told him he had improved  . Pneumonia   . Thoracic ascending aortic aneurysm (HCC)    4.5 cm 03/2018 CT  . Thyroid cancer [facility])     Past Surgical History:  Procedure  Laterality Date  . ABDOMINAL ANGIOGRAM  08/05/12  . aneurysm repair    . CARDIOVERSION  02/12/2012   Procedure: CARDIOVERSION;  Surgeon: Pixie Casino, MD;  Location: [facility] ENDOSCOPY;  Service: Cardiovascular;  Laterality: N/A;  . CARDIOVERSION N/A 06/22/2013   Procedure: CARDIOVERSION;  Surgeon: [name], MD;  Location: [place];  Service: Cardiovascular;  Laterality: N/A;  . CATARACT EXTRACTION Bilateral 2018   with lens implant  . CHOLECYSTECTOMY    . COLONOSCOPY    . ESOPHAGOGASTRODUODENOSCOPY    . IR IMAGING GUIDED PORT INSERTION  05/26/2018  . LYMPH NODE BIOPSY Left 04/27/2018   Procedure: EXCISIONAL BIOPSY OF LEFT CERVICAL LYMPH NODE;  Surgeon: Melida Quitter, MD;  Location: [place];  Service: ENT;  Laterality: Left;  . LYMPH NODE BIOPSY Left 11/23/2018   Procedure: LEFT CERVICAL LYMPH NODE OPEN BIOPSY;  Surgeon: Melida Quitter, MD;  Location: [place];  Service: ENT;  Laterality: Left;  . MENISCUS REPAIR Right 2009  . MITRAL VALVE REPAIR  2002   [facility]  . NM MYOVIEW LTD  07/22/2006   no ischemia  . RADICAL NECK DISSECTION Left 01/15/2019   Procedure: LEFT NECK DISSECTION;  Surgeon: Melida Quitter, MD;  Location: [place];  Service: ENT;  Laterality: Left;  . RIGHT HEART CATH  06/19/2004   normal right heart dynamics. EF 50%  . TEE WITHOUT CARDIOVERSION  02/12/2012   Procedure: TRANSESOPHAGEAL ECHOCARDIOGRAM (TEE);  Surgeon: Pixie Casino, MD;  Location: [facility] ENDOSCOPY;  Service: Cardiovascular;  Laterality: N/A;  . TEE WITHOUT CARDIOVERSION N/A 06/22/2013   Procedure: TRANSESOPHAGEAL ECHOCARDIOGRAM (TEE);  Surgeon: [name], MD;  Location: [place];  Service: Cardiovascular;  Laterality: N/A;  . THYROIDECTOMY N/A 01/15/2019   Procedure: TOTAL THYROIDECTOMY;  Surgeon: Melida Quitter, MD;  Location: [place];  Service: ENT;  Laterality: N/A;    There were no vitals filed for this visit.  Subjective Assessment - 04/27/19 0808    Subjective  I feel like my neck is doing ok. I  am still concerned about the hard area and numbness on my neck.    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, 01/15/19 underwent left neck dissection and thyroidectomy for treatment of papillary thyroid cancer, pt completed 6 months of chemo in July and will begin again around March 2021 after next operation at [facility] to improve swallowing, 12/2000 mitral valve repair, 10/2009 R knee arthroscopic repair of meniscus, 01/2012 cardioversion, 07/2012 retroperitoneal abdominal bleed, 11/2012- coiling of aneurysm- 4 abdominal aneurysms remain, 01/2013 cardioversion, 07/2013 ablation, 2018 cataract srugery, 01/2019 vocal cord/fold injections    Patient Stated Goals  decreased neck tightness    Currently in Pain?  No/denies                       [facility] Adult PT Treatment/Exercise - 04/27/19 0001      Manual Therapy   Soft tissue mobilization  In supine to bilateral neck, holding neck in prolonged stretch to opposite side performed soft tissue mobilization to side of neck especially to bilateral SCMs and scalenes, also along left neck scar to improve mobility    Myofascial Release  along left neck scar using cross hand technique, suboccipital release    Passive ROM  to neck in direction of lateral flexion with prolonged holds to left and right and rotation to left and right, AROM nearly doubled in direction of lateral flexion in both directions after PROM                  PT Long Term Goals - 04/27/19 0809      PT LONG TERM GOAL #1   Title  Pt will be able to sleep on R side without increased left sided neck tightness and discomfort.    Baseline  04/27/19- pt is able to do this now    Time  4    Period  Weeks    Status  Achieved      PT LONG TERM GOAL #2   Title  Pt will be able to turn and look over either shoulder at intersection while driving without limitation or discomfort in neck.    Baseline  04/27/19- pt is still having some tightness but it is improving    Time  4     Period  Weeks    Status  On-going      PT LONG TERM GOAL #3   Title  Pt will be independent in a home exercise program for continued strengthening and stretching.    Baseline  04/27/19- pt is independent in this    Time  4    Period  Weeks    Status  Achieved      PT LONG TERM  GOAL #4   Title  Pt will report a 75% improvement in tightness around left neck dissection scar to improve comfort.    Baseline  04/27/19- at least 50% improved    Time  4    Period  Weeks    Status  On-going            Plan - 04/27/19 3709    Clinical Impression Statement  Assessed pt's progress towards goals in therapy today. Pt has met several goals and is progressing towards others. He feels a decrease in tightness in his neck and improved ROM and mobility. He is undergoing surgery on his vocal cords on 05/06/19. Will place pt on hold for 4 weeks and then reassess goals and cervical ROM to determine if he needs continued skilled PT services. Educated pt to ask surgeon when he will be cleared to return to doing his home exercise program.    Comorbidities  heart problems (atrial fib with cardioversions), abdominal aneuryms    PT Frequency  1x / week    PT Duration  4 weeks    PT Treatment/Interventions  ADLs/Self Care Home Management;Therapeutic exercise;Manual techniques;Manual lymph drainage;Patient/family education;Scar mobilization;Passive range of motion    PT Next Visit Plan  reassess all goals and cervical ROM since pt had sugery on 05/06/19- decide if he needs continued skilled pT services and recert/add goals as needed    PT Home Exercise Plan  head and neck ROM exercises,Access Code: [email or web address]    Consulted and Agree with Plan of Care  Patient       Patient will benefit from skilled therapeutic intervention in order to improve the following deficits and impairments:  Pain, Increased fascial restricitons, Decreased scar mobility, Decreased range of motion, Decreased strength, Increased edema  Visit  Diagnosis: Cervicalgia  Muscle weakness (generalized)     Problem List Patient Active Problem List   Diagnosis Date Noted  . Deficiency anemia 04/19/2019  . Vitamin D deficiency 04/19/2019  . Hypocalcemia 04/19/2019  . Acquired lymphedema 02/22/2019  . Speech and language deficits 02/22/2019  . 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([place]) 11/26/2018  . Lymphoma ([place]) 11/23/2018  . Hyperlipidemia 11/03/2018  . Neck discomfort 09/23/2018  . Other constipation 08/26/2018  . Pancytopenia, acquired ([place]) 07/28/2018  . 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 Insomnia secondary to chronic pain 10/28/2017  . OSA (obstructive sleep apnea) 10/28/2017  . OSA on CPAP 07/23/2017  . Neuropathy 07/23/2017  . RLS (restless legs syndrome) 07/23/2017  . Gallstones 03/30/2013  . Palpitations 02/16/2013  . 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 Pain in limb-Abdominal 09/21/2012  . Nontraumatic retroperitoneal hematoma, spontaneous while on anticoagulant therapy 08/29/2012  . Acute post-hemorrhagic anemia 08/29/2012  . Acute GI bleeding, spontaneous  08/06/2012  . Atrial flutter, status post TE guided cardioversion November 2013 08/06/2012  . 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/ring by Dr Evelina Dun at [facility] '02 08/06/2012  . Normal coronary arteries at cath 2006 (false positive Nuc) 08/06/2012  . 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 Contrast media allergy 08/06/2012  . Acute renal insufficiency 08/06/2012  . NSVT (nonsustained ventricular tachycardia) ([place]) 08/06/2012  . Hemorrhagic shock- now stable 08/04/2012  . Coagulopathy- chronic anticoagulation with Xarelto 08/04/2012  . Bradycardia- HR 58 NSR on admission 08/04/12 08/04/2012    Allyson Sabal [facility] 04/27/2019, 8:54 AM  [place], [place], [address] Phone: [phone]   Fax:  [phone]  Name: Tanuj Mullens  [place] MRN:  [id number] Date of Birth: 1940/04/22  Allyson Sabal [place], PT 04/27/19 8:54 AM

## 2019-05-04 DIAGNOSIS — Z01818 Encounter for other preprocedural examination: Secondary | ICD-10-CM | POA: Diagnosis not present

## 2019-05-04 DIAGNOSIS — R1312 Dysphagia, oropharyngeal phase: Secondary | ICD-10-CM | POA: Diagnosis not present

## 2019-05-04 DIAGNOSIS — J383 Other diseases of vocal cords: Secondary | ICD-10-CM | POA: Diagnosis not present

## 2019-05-04 DIAGNOSIS — R490 Dysphonia: Secondary | ICD-10-CM | POA: Diagnosis not present

## 2019-05-04 DIAGNOSIS — J3801 Paralysis of vocal cords and larynx, unilateral: Secondary | ICD-10-CM | POA: Diagnosis not present

## 2019-05-04 DIAGNOSIS — Z01812 Encounter for preprocedural laboratory examination: Secondary | ICD-10-CM | POA: Diagnosis not present

## 2019-05-04 DIAGNOSIS — Z20822 Contact with and (suspected) exposure to covid-19: Secondary | ICD-10-CM | POA: Diagnosis not present

## 2019-05-06 DIAGNOSIS — Z8572 Personal history of non-Hodgkin lymphomas: Secondary | ICD-10-CM | POA: Diagnosis not present

## 2019-05-06 DIAGNOSIS — R490 Dysphonia: Secondary | ICD-10-CM | POA: Diagnosis not present

## 2019-05-06 DIAGNOSIS — I4892 Unspecified atrial flutter: Secondary | ICD-10-CM | POA: Diagnosis not present

## 2019-05-06 DIAGNOSIS — J383 Other diseases of vocal cords: Secondary | ICD-10-CM | POA: Diagnosis not present

## 2019-05-06 DIAGNOSIS — E782 Mixed hyperlipidemia: Secondary | ICD-10-CM | POA: Diagnosis not present

## 2019-05-06 DIAGNOSIS — Z79899 Other long term (current) drug therapy: Secondary | ICD-10-CM | POA: Diagnosis not present

## 2019-05-06 DIAGNOSIS — J3801 Paralysis of vocal cords and larynx, unilateral: Secondary | ICD-10-CM | POA: Diagnosis not present

## 2019-05-06 DIAGNOSIS — Z85850 Personal history of malignant neoplasm of thyroid: Secondary | ICD-10-CM | POA: Diagnosis not present

## 2019-05-12 ENCOUNTER — Telehealth: Payer: Self-pay | Admitting: *Deleted

## 2019-05-12 NOTE — Telephone Encounter (Signed)
Patient's wife called to cancel March 2nd appointments. Patient is scheduled to have 2nd COVID vaccine this day. He would like to postpone treatment until the week of March 16th. He would like to find out if this week would be acceptable.

## 2019-05-13 NOTE — Telephone Encounter (Signed)
Does he want to cancel everything including PET scan or keep PET scan and see me appointment but just reschedule labs. Flush and treatment?

## 2019-05-13 NOTE — Telephone Encounter (Signed)
Patient's wife agrees to Virtual visit at [place] 3/2. Chemo has been cancelled.

## 2019-05-13 NOTE — Telephone Encounter (Signed)
I am sorry to get you involved I recommend keeping my appointment but as virtual visit the next day to discuss I do not want to get a scan done and not review results with patient

## 2019-05-13 NOTE — Telephone Encounter (Signed)
Patient wants to cancel office visit and treatment only. He would like to still have the PET scan completed as currently scheduled.

## 2019-05-14 DIAGNOSIS — I340 Nonrheumatic mitral (valve) insufficiency: Secondary | ICD-10-CM | POA: Diagnosis not present

## 2019-05-18 DIAGNOSIS — J3801 Paralysis of vocal cords and larynx, unilateral: Secondary | ICD-10-CM | POA: Diagnosis not present

## 2019-05-18 DIAGNOSIS — R490 Dysphonia: Secondary | ICD-10-CM | POA: Diagnosis not present

## 2019-05-18 DIAGNOSIS — J383 Other diseases of vocal cords: Secondary | ICD-10-CM | POA: Diagnosis not present

## 2019-05-19 DIAGNOSIS — N1831 Chronic kidney disease, stage 3a: Secondary | ICD-10-CM | POA: Diagnosis not present

## 2019-05-19 DIAGNOSIS — I429 Cardiomyopathy, unspecified: Secondary | ICD-10-CM | POA: Diagnosis not present

## 2019-05-19 DIAGNOSIS — E890 Postprocedural hypothyroidism: Secondary | ICD-10-CM | POA: Diagnosis not present

## 2019-05-19 DIAGNOSIS — I700 Atherosclerosis of aorta: Secondary | ICD-10-CM | POA: Diagnosis not present

## 2019-05-19 DIAGNOSIS — J3800 Paralysis of vocal cords and larynx, unspecified: Secondary | ICD-10-CM | POA: Diagnosis not present

## 2019-05-19 DIAGNOSIS — N401 Enlarged prostate with lower urinary tract symptoms: Secondary | ICD-10-CM | POA: Diagnosis not present

## 2019-05-19 DIAGNOSIS — C73 Malignant neoplasm of thyroid gland: Secondary | ICD-10-CM | POA: Diagnosis not present

## 2019-05-19 DIAGNOSIS — G609 Hereditary and idiopathic neuropathy, unspecified: Secondary | ICD-10-CM | POA: Diagnosis not present

## 2019-05-19 DIAGNOSIS — Z952 Presence of prosthetic heart valve: Secondary | ICD-10-CM | POA: Diagnosis not present

## 2019-05-19 DIAGNOSIS — C8310 Mantle cell lymphoma, unspecified site: Secondary | ICD-10-CM | POA: Diagnosis not present

## 2019-05-19 DIAGNOSIS — I2510 Atherosclerotic heart disease of native coronary artery without angina pectoris: Secondary | ICD-10-CM | POA: Diagnosis not present

## 2019-05-24 ENCOUNTER — Other Ambulatory Visit: Payer: Self-pay

## 2019-05-24 ENCOUNTER — Inpatient Hospital Stay: Payer: Medicare Other | Attending: Hematology and Oncology

## 2019-05-24 ENCOUNTER — Inpatient Hospital Stay: Payer: Medicare Other

## 2019-05-24 ENCOUNTER — Encounter (HOSPITAL_COMMUNITY)
Admission: RE | Admit: 2019-05-24 | Discharge: 2019-05-24 | Disposition: A | Discharge status: Home / Self Care | Payer: Medicare Other | Source: Ambulatory Visit | Attending: Hematology and Oncology | Admitting: Hematology and Oncology

## 2019-05-24 DIAGNOSIS — C73 Malignant neoplasm of thyroid gland: Secondary | ICD-10-CM | POA: Diagnosis not present

## 2019-05-24 DIAGNOSIS — D539 Nutritional anemia, unspecified: Secondary | ICD-10-CM

## 2019-05-24 DIAGNOSIS — Z79899 Other long term (current) drug therapy: Secondary | ICD-10-CM | POA: Insufficient documentation

## 2019-05-24 DIAGNOSIS — E559 Vitamin D deficiency, unspecified: Secondary | ICD-10-CM

## 2019-05-24 DIAGNOSIS — C8318 Mantle cell lymphoma, lymph nodes of multiple sites: Secondary | ICD-10-CM | POA: Insufficient documentation

## 2019-05-24 DIAGNOSIS — C8590 Non-Hodgkin lymphoma, unspecified, unspecified site: Secondary | ICD-10-CM | POA: Diagnosis not present

## 2019-05-24 LAB — CMP (CANCER CENTER ONLY)
ALT: 21 U/L (ref 0–44)
AST: 16 U/L (ref 15–41)
Albumin: 4 g/dL (ref 3.5–5.0)
Alkaline Phosphatase: 63 U/L (ref 38–126)
Anion gap: 7 (ref 5–15)
BUN: 24 mg/dL — ABNORMAL HIGH (ref 8–23)
CO2: 28 mmol/L (ref 22–32)
Calcium: 8.9 mg/dL (ref 8.9–10.3)
Chloride: 108 mmol/L (ref 98–111)
Creatinine: 1.11 mg/dL (ref 0.61–1.24)
GFR, Est AFR Am: >60 mL/min (ref >60)
GFR, Estimated: >60 mL/min (ref >60)
Glucose, Bld: 91 mg/dL (ref 70–99)
Potassium: 4.3 mmol/L (ref 3.5–5.1)
Sodium: 143 mmol/L (ref 135–145)
Total Bilirubin: 1.1 mg/dL (ref 0.3–1.2)
Total Protein: 6.4 g/dL — ABNORMAL LOW (ref 6.5–8.1)

## 2019-05-24 LAB — CBC WITH DIFFERENTIAL (CANCER CENTER ONLY)
Abs Immature Granulocytes: 0.04 10*3/uL (ref 0.00–0.07)
Basophils Absolute: 0.1 10*3/uL (ref 0.0–0.1)
Basophils Relative: 1 %
Eosinophils Absolute: 0.3 10*3/uL (ref 0.0–0.5)
Eosinophils Relative: 6 %
HCT: 38.7 % — ABNORMAL LOW (ref 39.0–52.0)
Hemoglobin: 12.8 g/dL — ABNORMAL LOW (ref 13.0–17.0)
Immature Granulocytes: 1 %
Lymphocytes Relative: 7 %
Lymphs Abs: 0.3 10*3/uL — ABNORMAL LOW (ref 0.7–4.0)
MCH: 30.3 pg (ref 26.0–34.0)
MCHC: 33.1 g/dL (ref 30.0–36.0)
MCV: 91.5 fL (ref 80.0–100.0)
Monocytes Absolute: 0.6 10*3/uL (ref 0.1–1.0)
Monocytes Relative: 13 %
Neutro Abs: 3.3 10*3/uL (ref 1.7–7.7)
Neutrophils Relative %: 72 %
Platelet Count: 169 10*3/uL (ref 150–400)
RBC: 4.23 MIL/uL (ref 4.22–5.81)
RDW: 13.3 % (ref 11.5–15.5)
WBC Count: 4.5 10*3/uL (ref 4.0–10.5)
nRBC: 0 % (ref 0.0–0.2)

## 2019-05-24 LAB — VITAMIN D 25 HYDROXY (VIT D DEFICIENCY, FRACTURES): Vit D, 25-Hydroxy: 44.05 ng/mL (ref 30–100)

## 2019-05-24 LAB — VITAMIN B12: Vitamin B-12: 355 pg/mL (ref 180–914)

## 2019-05-24 LAB — GLUCOSE, CAPILLARY: Glucose-Capillary: 88 mg/dL (ref 70–99)

## 2019-05-24 MED ORDER — SODIUM CHLORIDE 0.9% FLUSH
10.0000 mL | Freq: Once | INTRAVENOUS | Status: AC
  Administered 2019-05-24: 10 mL
  Filled 2019-05-24: qty 10

## 2019-05-24 MED ORDER — FLUDEOXYGLUCOSE F - 18 (FDG) INJECTION
10.5700 | Freq: Once | INTRAVENOUS | Status: AC | PRN
  Administered 2019-05-24: 10.57 via INTRAVENOUS

## 2019-05-25 ENCOUNTER — Telehealth (HOSPITAL_BASED_OUTPATIENT_CLINIC_OR_DEPARTMENT_OTHER): Payer: Medicare Other | Admitting: Hematology and Oncology

## 2019-05-25 ENCOUNTER — Telehealth: Payer: Self-pay | Admitting: Hematology and Oncology

## 2019-05-25 ENCOUNTER — Ambulatory Visit: Payer: Medicare Other

## 2019-05-25 DIAGNOSIS — R479 Unspecified speech disturbances: Secondary | ICD-10-CM

## 2019-05-25 DIAGNOSIS — F809 Developmental disorder of speech and language, unspecified: Secondary | ICD-10-CM | POA: Diagnosis not present

## 2019-05-25 DIAGNOSIS — C73 Malignant neoplasm of thyroid gland: Secondary | ICD-10-CM | POA: Diagnosis not present

## 2019-05-25 DIAGNOSIS — Z79899 Other long term (current) drug therapy: Secondary | ICD-10-CM | POA: Diagnosis not present

## 2019-05-25 DIAGNOSIS — C8318 Mantle cell lymphoma, lymph nodes of multiple sites: Secondary | ICD-10-CM | POA: Diagnosis not present

## 2019-05-25 MED ORDER — LEVOTHYROXINE SODIUM 175 MCG PO TABS: 175.0000 ug | ORAL_TABLET | Freq: Every day | ORAL | Status: DC

## 2019-05-25 NOTE — Telephone Encounter (Signed)
Scheduled appt per 3/2 sch message - pt is aware of appt date and time   

## 2019-05-26 ENCOUNTER — Other Ambulatory Visit: Payer: Self-pay | Admitting: Hematology and Oncology

## 2019-05-26 DIAGNOSIS — N1831 Chronic kidney disease, stage 3a: Secondary | ICD-10-CM | POA: Diagnosis not present

## 2019-05-26 DIAGNOSIS — E890 Postprocedural hypothyroidism: Secondary | ICD-10-CM | POA: Diagnosis not present

## 2019-05-26 DIAGNOSIS — C73 Malignant neoplasm of thyroid gland: Secondary | ICD-10-CM | POA: Diagnosis not present

## 2019-05-26 DIAGNOSIS — C8310 Mantle cell lymphoma, unspecified site: Secondary | ICD-10-CM | POA: Diagnosis not present

## 2019-05-26 NOTE — Assessment & Plan Note (Signed)
I have reviewed PET CT scan with the patient and his wife over the phone It appears that the patient have complete response on imaging study I also reviewed documentation at [facility] Consideration is made for maintenance rituximab given that the patient is not eligible for intensive treatment We discussed briefly the risk and benefits of maintenance rituximab and he appears to be interested We will make sure that his treatment has received insurance approval I will schedule the treatment to start on the week of March 15

## 2019-05-26 NOTE — Assessment & Plan Note (Signed)
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

## 2019-05-26 NOTE — Assessment & Plan Note (Signed)
This is improved since he under went surgery at [facility]

## 2019-05-26 NOTE — Progress Notes (Signed)
,  HEMATOLOGY-ONCOLOGY ELECTRONIC VISIT PROGRESS NOTE  Patient Care Team: [name], MD as PCP - General (Internal Medicine) Blazing, Venia Carbon, MD as PCP - Cardiology (Cardiology) Festus Aloe, MD as Referring Physician (Hematology and Oncology)  I connected with by [facility] video conference and verified that I am speaking with the correct person using two identifiers.  I discussed the limitations, risks, security and privacy concerns of performing an evaluation and management service by [facility] and the availability of in person appointments.  I also discussed with the patient that there may be a patient responsible charge related to this service. The patient expressed understanding and agreed to proceed.   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[place]) I have reviewed PET CT scan with the patient and his wife over the phone It appears that the patient have complete response on imaging study I also reviewed documentation at [facility] Consideration is made for maintenance rituximab given that the patient is not eligible for intensive treatment We discussed briefly the risk and benefits of maintenance rituximab and he appears to be interested We will make sure that his treatment has received insurance approval I will schedule the treatment to start on the week of March 15  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   INTERVAL HISTORY: Please see below for problem oriented charting. 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 2. 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 3. Slight reduction in size of right retroperitoneal evolving hematoma.   03/30/2018 Imaging   Ct neck 1. Extensive adenopathy on the left. The largest node is a level 1/submandibular node measuring 38 x 24 x 22 mm. Largest level 2 node measures 3.2 x 2 x 2 cm. Numerous other smaller but round lymph nodes throughout the left neck in the level 2 through level 4 region. The largest level 5 node measures 3.4 x 3.4 x 2.3 cm with a transverse diameter of 2.3 cm. This contains some internal calcifications. There are numerous other pathologic nodes in the left supraclavicular to axillary region. This pattern of disease could be due to extensive left neck metastatic disease from unknown primary, lymphoma, or other systemic malignancy. 2. No evidence of mucosal or submucosal lesion. 3. Diameter of the ascending aorta is 4.5 cm. Recommend annual imaging followup by CTA or MRA. Aortic Atherosclerosis (ICD10-I70.0).    04/10/2018 Pathology Results   Left zone 1 neck mass, Fine Needle Aspiration I (smears and cell block): Atypical lymphoid proliferation. See comment.  Specimen Adequacy: Satisfactory for evaluation.  COMMENT:The aspirate demonstrates abundant small to medium sized lymphocytes with scattered  epithelioid cells in the background which may be histiocytes. The smears are cellular, but the cell block includes scant lymphocytes.  Immunohistochemical stains were attempted showing positive staining for CD20 with rare staining for CD3. Cytokeratin AE1/AE3 is negative. S100 shows high nonspecific background staining but no specifically diagnostic cellular staining. Overall, the findings indicate an atypical lymphoid proliferation but are too limited for definitive diagnosis. Excisional biopsy with flow cytometry is recommended.   04/10/2018 Procedure   He was ENT who performed FNA of neck LN   04/27/2018 Pathology Results   Lymph node for lymphoma, [name], [name], [name], [name], [name], [name], [name], the features are consistent with a mantle cell lymphoma   05/13/2018 PET scan   1. Hypermetabolic left cervical lymphadenopathy, left axillary lymphadenopathy, mediastinal / right hilar lymphadenopathy, right external iliac lymphadenopathy, and subcentimeter left external iliac and left inguinal lymph nodes, [name], [name], [name], without focal lesions, that may represent lymphomatous involvement of the spleen.   05/21/2018 Cancer Staging   Staging form: Hodgkin and Non-Hodgkin Lymphoma, AJCC 8th Edition - Clinical: Stage III - Signed by Heath Lark, MD on 05/21/2018   05/26/2018  Procedure   Ultrasound and fluoroscopically guided right internal jugular single lumen power port catheter insertion. Tip in the SVC/RA junction. Catheter ready for use.    06/01/2018 -  Chemotherapy   The patient had Bendamustine and Rituximab for chemotherapy treatment   08/24/2018 PET scan   1. Partial response to therapy with complete resolution of size and metabolic activity of the dominant LEFT submandibular node (level 1) and LEFT axillary nodes. 2. Persistent and increased metabolic activity of left level 3 lymph nodes ( Deauville 4). Nodes are partially calcified and similar size. 3. No new metastatic disease. 4. Normal spleen and bone marrow.   11/09/2018 PET scan   IMPRESSION: 1. Mild response to therapy of hypermetabolic left cervical nodes. ([place]) 4. 2. No new or progressive disease. 3. Coronary artery atherosclerosis. Aortic Atherosclerosis (ICD10-I70.0). 4. Trace cul-de-sac fluid, similar.   12/18/2018 Imaging   1. 4 mm solid nodule in the right lower lobe, which is technically indeterminate. Recommend attention on follow-up per clinical protocol. 2. Small calcified nodule adjacent to the left lobe of the thyroid, which may represent thyroid nodule or calcified lymph node. Recommend correlation with thyroid ultrasound. 3. The ascending aorta appears mildly dilated, measuring up to 4.2cm. 4. Decreased left axillary lymphadenopathy.   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   Stranding with postsurgical changes along the right anterior neck anterior to the thyroid cartilage, favored to be related to recent surgery. Attention on follow-up is suggested.   No suspicious lymphadenopathy in the neck, chest, abdomen, or pelvis. Deauville criteria 1   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([place])  11/23/2018 Pathology Results  Lymph node for lymphoma, Left zone 3 - PAPILLARY THYROID  CARCINOMA. - SEE MICROSCOPIC DESCRIPTION. Microscopic Comment The specimen consists mostly of encapsulated papillary thyroid carcinoma. There are portions of lymphoid tissue attached to the periphery of the specimen and in the adjacent adipose tissue which suggests that this may represent papillary carcinoma metastatic to a lymph node.   11/23/2018 Surgery   PREOPERATIVE DIAGNOSIS:  Mantle cell lymphoma and cervical lymphadenopathy.   POSTOPERATIVE DIAGNOSIS:  Mantle cell lymphoma and cervical lymphadenopathy.   PROCEDURE:  Excisional biopsy of left cervical lymph nodes.     01/15/2019 Pathology Results   A. SOFT TISSUE, NECK, ADJACENT TO LEFT RECURRENT NERVE, BIOPSY: - Papillary thyroid carcinoma. B. 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 C. ADDITIONAL LEFT LEVEL 4 TISSUE, EXCISION: - There is no evidence of carcinoma in 2 of 2 lymph nodes (0/2). D. TOTAL THYROIDECTOMY: - Papillary thyroid carcinoma, 0.8 cm. - Carcinoma is broadly present at an inked tissue edge. - See oncology table below. E. LEFT LEVEL 6 TISSUE, EXCISION: - Benign fibroadipose tissue. - There is no evidence of malignancy. F. LEFT RECURRENT NERVE AND SURROUNDING TISSUE, EXCISION: - Metastatic papillary thyroid carcinoma in 3 of 3 lymph nodes (3/3).  THYROID GLAND: Procedure: Thyroidectomy Tumor Focality: Unifocal Tumor Site: Left lobe Tumor Size: 0.8 cm Histologic Type: 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Margins: Carcinoma is broadly present at a black inked tissue edge. 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   PROCEDURE:  Total thyroidectomy and left modified radical neck dissection.   SURGEON:  Melida Quitter, MD     FINDINGS:  There was a firm nodule in zone IIB on the left side and also some firm tissue and adherence to the jugular vein in zone III region requiring removal of a portion of the vein.  There was a firm nodule in the left thyroid lobe that extended posterior to the gland, adhering to the trachea, and encompassing the recurrent laryngeal nerve.  Tissue was sent for frozen section from adjacent to the nerve, demonstrating papillary carcinoma.  The nerve stimulated distal to the mass but not proximal to the mass; thus, the nerve was removed with the mass.  The left-sided parathyroid glands were visualized, as was the right superior parathyroid gland.  The right recurrent nerve was kept safe and stimulated well at the end of the case.     REVIEW OF SYSTEMS:   Constitutional: Denies fevers, chills or abnormal weight loss Eyes: Denies blurriness of vision Ears, nose, mouth, throat, and face: Denies mucositis or sore throat Respiratory: Denies cough, dyspnea or wheezes Cardiovascular: Denies palpitation, chest discomfort Gastrointestinal:  Denies nausea, heartburn or change in bowel habits Skin: Denies abnormal skin rashes Lymphatics: Denies new lymphadenopathy or easy bruising Neurological:Denies numbness, tingling or new weaknesses Behavioral/Psych: Mood is stable, no new changes  Extremities: No lower extremity edema All other systems were reviewed with the patient and are negative.  I have  reviewed the past medical history, past surgical history, social history and family history with the patient and they are unchanged  from previous note.  ALLERGIES:  is allergic to aspirin; levaquin [levofloxacin in d5w]; nickel; other; statins; allopurinol; and ampicillin.  MEDICATIONS:  Current Outpatient Medications  Medication Sig Dispense Refill  . acetaminophen (TYLENOL) 500 MG tablet Take 1,000 mg by mouth daily as needed for moderate pain.    Marland Kitchen acyclovir (ZOVIRAX) 400 MG tablet Take 1 tablet (400 mg total) by mouth daily at 12 noon. 90 tablet 3  . alfuzosin (UROXATRAL) 10 MG 24 hr tablet Take 10 mg by mouth at bedtime.     Marland Kitchen buPROPion (WELLBUTRIN XL) 150 MG 24 hr tablet TAKE 1 TABLET DAILY (Patient taking differently: Take 150 mg by mouth daily. ) 90 tablet 3  . Cholecalciferol (VITAMIN D) 2000 units tablet Take 2,000 Units by mouth at bedtime.     Marland Kitchen ezetimibe (ZETIA) 10 MG tablet Take 10 mg by mouth at bedtime.    . finasteride (PROSCAR) 5 MG tablet Take 5 mg by mouth at bedtime.     Marland Kitchen levothyroxine (SYNTHROID) 175 MCG tablet Take 1 tablet (175 mcg total) by mouth daily.    Marland Kitchen lidocaine-prilocaine (EMLA) cream Apply to affected area once (Patient not taking: Reported on 01/07/2019) 30 g 3  . metoprolol succinate (TOPROL-XL) 25 MG 24 hr tablet Take 12.5 mg by mouth daily.     Vladimir Faster Glycol-Propyl Glycol (SYSTANE HYDRATION PF OP) Place 1 drop into both eyes 3 (three) times daily as needed (dry eyes).      No current facility-administered medications for this visit.    PHYSICAL EXAMINATION: ECOG PERFORMANCE STATUS: 0 - Asymptomatic  LABORATORY DATA:  I have reviewed the data as listed CMP Latest Ref Rng & Units 05/24/2019 04/19/2019 02/22/2019  Glucose 70 - 99 mg/dL 91 93 104(H)  BUN 8 - 23 mg/dL 24(H) 20 25(H)  Creatinine 0.61 - 1.24 mg/dL 1.11 1.21 1.29(H)  Sodium 135 - 145 mmol/L 143 140 141  Potassium 3.5 - 5.1 mmol/L 4.3 4.4 4.1  Chloride 98 - 111 mmol/L 108 107 106  CO2 22 - 32 mmol/L _0 Calcium 8.9 - 10.3 mg/dL 8.9 8.6(L) 8.7(L)  Total Protein 6.5 - 8.1 g/dL 6.4(L) 6.1(L) 6.1(L)  Total  Bilirubin 0.3 - 1.2 mg/dL 1.1 0.8 1.1  Alkaline Phos 38 - 126 U/L 63 66 63  AST 15 - 41 U/L _1 ALT 0 - 44 U/L _2 Lab Results  Component Value Date   WBC 4.5 05/24/2019   HGB 12.8 (L) 05/24/2019   HCT 38.7 (L) 05/24/2019   MCV 91.5 05/24/2019   PLT 169 05/24/2019   NEUTROABS 3.3 05/24/2019     RADIOGRAPHIC STUDIES: I have personally reviewed the radiological images as listed and agreed with the findings in the report. NM PET Image Restag (PS) Skull Base To Thigh  Result Date: 05/24/2019 CLINICAL DATA:  Subsequent treatment strategy for lymphoma. Status post thyroidectomy with left neck dissection on 01/15/2019. Status post radioactive iodine therapy on 03/15/2019. Status post laryngoplasty with unilateral medialization on 05/06/2019. EXAM: NUCLEAR MEDICINE PET SKULL BASE TO THIGH TECHNIQUE: 10.6 mCi F-18 FDG was injected intravenously. Full-ring PET imaging was performed from the skull base to thigh after the radiotracer. CT data was obtained and used for attenuation correction and anatomic localization. Fasting blood glucose: 88 mg/dl COMPARISON:  11/09/2018 FINDINGS: Mediastinal blood pool activity: SUV max  2.7 Liver activity: SUV max 4.1 NECK: No hypermetabolic cervical lymphadenopathy. Stranding with fluid along the right anterior neck anterior to the thyroid cartilage (series 4/image 46), favoring postsurgical change in this patient status post recent laryngoplasty with unilateral medialization. Max SUV 7.3. Focal hypermetabolism along the right vocal cord, max SUV 12.7. Suspected left vocal cord paralysis, suggesting that this appearance is physiologic. Incidental CT findings: none CHEST: No hypermetabolic thoracic lymphadenopathy. No suspicious pulmonary nodules. Incidental CT findings: Right chest port terminates in the mid SVC. Atherosclerotic calcifications of the aortic arch. Mild three-vessel coronary atherosclerosis. ABDOMEN/PELVIS: No hypermetabolic mediastinal  lymphadenopathy. No abnormal hypermetabolism in the liver, spleen, pancreas, or adrenal glands. Representative splenic hypermetabolism 3.1. Incidental CT findings: Status post cholecystectomy. Atherosclerotic calcifications the abdominal aorta and branch vessels. Trace pelvic fluid. Prostatomegaly. SKELETON: No focal hypermetabolic activity to suggest skeletal metastasis. Incidental CT findings: Degenerative changes of the visualized thoracolumbar spine. 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 Stranding with postsurgical changes along the right anterior neck anterior to the thyroid cartilage, favored to be related to recent surgery. Attention on follow-up is suggested. No suspicious lymphadenopathy in the neck, chest, abdomen, or pelvis. Deauville criteria 1. Electronically Signed   By: Julian Hy M.D.   On: 05/24/2019 14:00    I discussed the assessment and treatment plan with the patient. The patient was provided an opportunity to ask questions and all were answered. The patient agreed with the plan and demonstrated an understanding of the instructions. The patient was advised to call back or seek an in-person evaluation if the symptoms worsen or if the condition fails to improve as anticipated.    I spent 20 minutes for the appointment reviewing test results, discuss management and coordination of care.  Heath Lark, MD 05/26/2019 7:55 AM

## 2019-05-28 DIAGNOSIS — R1310 Dysphagia, unspecified: Secondary | ICD-10-CM | POA: Diagnosis not present

## 2019-05-28 DIAGNOSIS — J3801 Paralysis of vocal cords and larynx, unilateral: Secondary | ICD-10-CM | POA: Diagnosis not present

## 2019-05-28 DIAGNOSIS — R1313 Dysphagia, pharyngeal phase: Secondary | ICD-10-CM | POA: Diagnosis not present

## 2019-05-28 DIAGNOSIS — G839 Paralytic syndrome, unspecified: Secondary | ICD-10-CM | POA: Diagnosis not present

## 2019-05-28 DIAGNOSIS — Z85850 Personal history of malignant neoplasm of thyroid: Secondary | ICD-10-CM | POA: Diagnosis not present

## 2019-06-01 DIAGNOSIS — J3801 Paralysis of vocal cords and larynx, unilateral: Secondary | ICD-10-CM | POA: Diagnosis not present

## 2019-06-01 DIAGNOSIS — R490 Dysphonia: Secondary | ICD-10-CM | POA: Diagnosis not present

## 2019-06-01 DIAGNOSIS — J383 Other diseases of vocal cords: Secondary | ICD-10-CM | POA: Diagnosis not present

## 2019-06-03 ENCOUNTER — Ambulatory Visit: Payer: Medicare Other

## 2019-06-07 ENCOUNTER — Other Ambulatory Visit: Payer: Self-pay

## 2019-06-07 ENCOUNTER — Inpatient Hospital Stay: Payer: Medicare Other

## 2019-06-07 ENCOUNTER — Inpatient Hospital Stay (HOSPITAL_BASED_OUTPATIENT_CLINIC_OR_DEPARTMENT_OTHER): Payer: Medicare Other | Admitting: Hematology and Oncology

## 2019-06-07 DIAGNOSIS — C73 Malignant neoplasm of thyroid gland: Secondary | ICD-10-CM

## 2019-06-07 DIAGNOSIS — Z79899 Other long term (current) drug therapy: Secondary | ICD-10-CM | POA: Diagnosis not present

## 2019-06-07 DIAGNOSIS — I728 Aneurysm of other specified arteries: Secondary | ICD-10-CM

## 2019-06-07 DIAGNOSIS — F809 Developmental disorder of speech and language, unspecified: Secondary | ICD-10-CM

## 2019-06-07 DIAGNOSIS — C8318 Mantle cell lymphoma, lymph nodes of multiple sites: Secondary | ICD-10-CM

## 2019-06-07 DIAGNOSIS — R479 Unspecified speech disturbances: Secondary | ICD-10-CM | POA: Diagnosis not present

## 2019-06-07 LAB — CBC WITH DIFFERENTIAL (CANCER CENTER ONLY)
Abs Immature Granulocytes: 0.03 10*3/uL (ref 0.00–0.07)
Basophils Absolute: 0 10*3/uL (ref 0.0–0.1)
Basophils Relative: 1 %
Eosinophils Absolute: 0.2 10*3/uL (ref 0.0–0.5)
Eosinophils Relative: 5 %
HCT: 39.2 % (ref 39.0–52.0)
Hemoglobin: 13.1 g/dL (ref 13.0–17.0)
Immature Granulocytes: 1 %
Lymphocytes Relative: 9 %
Lymphs Abs: 0.4 10*3/uL — ABNORMAL LOW (ref 0.7–4.0)
MCH: 31.2 pg (ref 26.0–34.0)
MCHC: 33.4 g/dL (ref 30.0–36.0)
MCV: 93.3 fL (ref 80.0–100.0)
Monocytes Absolute: 0.6 10*3/uL (ref 0.1–1.0)
Monocytes Relative: 15 %
Neutro Abs: 3 10*3/uL (ref 1.7–7.7)
Neutrophils Relative %: 69 %
Platelet Count: 149 10*3/uL — ABNORMAL LOW (ref 150–400)
RBC: 4.2 MIL/uL — ABNORMAL LOW (ref 4.22–5.81)
RDW: 12.9 % (ref 11.5–15.5)
WBC Count: 4.2 10*3/uL (ref 4.0–10.5)
nRBC: 0 % (ref 0.0–0.2)

## 2019-06-07 LAB — CMP (CANCER CENTER ONLY)
ALT: 16 U/L (ref 0–44)
AST: 17 U/L (ref 15–41)
Albumin: 4 g/dL (ref 3.5–5.0)
Alkaline Phosphatase: 58 U/L (ref 38–126)
Anion gap: 10 (ref 5–15)
BUN: 24 mg/dL — ABNORMAL HIGH (ref 8–23)
CO2: 25 mmol/L (ref 22–32)
Calcium: 8.8 mg/dL — ABNORMAL LOW (ref 8.9–10.3)
Chloride: 108 mmol/L (ref 98–111)
Creatinine: 1.3 mg/dL — ABNORMAL HIGH (ref 0.61–1.24)
GFR, Est AFR Am: >60 mL/min (ref >60)
GFR, Estimated: 52 mL/min — ABNORMAL LOW (ref >60)
Glucose, Bld: 104 mg/dL — ABNORMAL HIGH (ref 70–99)
Potassium: 3.9 mmol/L (ref 3.5–5.1)
Sodium: 143 mmol/L (ref 135–145)
Total Bilirubin: 1.2 mg/dL (ref 0.3–1.2)
Total Protein: 6.3 g/dL — ABNORMAL LOW (ref 6.5–8.1)

## 2019-06-07 MED ORDER — SODIUM CHLORIDE 0.9% FLUSH
10.0000 mL | Freq: Once | INTRAVENOUS | Status: AC
  Administered 2019-06-07: 10 mL
  Filled 2019-06-07: qty 10

## 2019-06-08 ENCOUNTER — Encounter: Payer: Self-pay | Admitting: Hematology and Oncology

## 2019-06-08 DIAGNOSIS — J3801 Paralysis of vocal cords and larynx, unilateral: Secondary | ICD-10-CM | POA: Diagnosis not present

## 2019-06-08 DIAGNOSIS — R490 Dysphonia: Secondary | ICD-10-CM | POA: Diagnosis not present

## 2019-06-08 DIAGNOSIS — I728 Aneurysm of other specified arteries: Secondary | ICD-10-CM | POA: Insufficient documentation

## 2019-06-08 DIAGNOSIS — J383 Other diseases of vocal cords: Secondary | ICD-10-CM | POA: Diagnosis not present

## 2019-06-08 MED ORDER — LIDOCAINE-PRILOCAINE 2.5-2.5 % EX CREA: TOPICAL_CREAM | CUTANEOUS | 3 refills | Status: DC

## 2019-06-08 NOTE — Assessment & Plan Note (Signed)
I have reviewed his recent PET CT scan with the patient and his wife He has complete response to treatment Despite not receiving any kind of therapy over the last 6 months, he is doing well We discussed the risk and benefits of maintenance rituximab Based on the most recent data published on [employer], a subgroup study of the StiL NHL7-2008 MAINTAIN trial failed to demonstrate improvement of overall survival on patients who receive maintenance rituximab, after induction chemotherapy with combination chemotherapy bendamustine and rituximab compared to patients who received R-CHOP chemotherapy as induction treatment Some of the expected risk of rituximab including risk of allergic reaction, prolonged lymphopenia and risk of infection  Ultimately, the patient is undecided but is leaning to its no treatment Without treatment, I would recommend repeat imaging study in 6 months for further follow-up He will think about it and will call me next week after he has made informed decision of how to proceed

## 2019-06-08 NOTE — Assessment & Plan Note (Signed)
He is doing well The changes seen around the thyroid region are postoperative in nature He will continue close follow-up with endocrinologist and chronic thyroid suppressive therapy We discussed the importance of communication with his endocrinologist before CT imaging in the future

## 2019-06-08 NOTE — Assessment & Plan Note (Signed)
His speech has improved with recent treatment at [facility]

## 2019-06-08 NOTE — Progress Notes (Signed)
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[facility]) I have reviewed his recent PET CT scan with the patient and his wife He has complete response to treatment Despite not receiving any kind of therapy over the last 6 months, he is doing well We discussed the risk and benefits of maintenance rituximab Based on the most recent data published on JCO, a subgroup study of the StiL NHL7-2008 MAINTAIN trial failed to demonstrate improvement of overall survival on patients who receive maintenance rituximab, after induction chemotherapy with combination chemotherapy bendamustine and rituximab compared to patients who received R-CHOP chemotherapy as induction treatment Some of the expected risk of rituximab including risk of allergic reaction, prolonged lymphopenia and risk of infection  Ultimately, the patient is undecided but is leaning to its no treatment Without treatment, I would recommend repeat imaging study in 6 months for further follow-up He will think about it and will call me next week after he has made informed decision of how to proceed   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[facility]) He is doing well The changes seen around the thyroid region are postoperative in nature He will continue close follow-up with endocrinologist and chronic thyroid suppressive therapy We discussed the importance of communication with his endocrinologist before CT imaging in the future  Speech and language deficits His speech has improved with recent treatment at [facility]   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   All questions were answered. The patient knows to call the clinic with any problems, questions or concerns. The total time spent in the appointment was 40 minutes encounter with patients including review of chart and various tests results, discussions about plan of care and coordination of care plan   Heath Lark, MD 06/08/2019 6:15 PM  INTERVAL HISTORY: Please see below for problem oriented charting. He returns with his wife for further follow-up He is doing well No abdominal pain He tolerated recent Covid vaccination well  SUMMARY OF ONCOLOGIC HISTORY: Oncology History Overview Note  MIPI score 7.5 high risk   Mantle cell lymphoma ([place])  06/13/2017 Imaging   Ct imaging at [facility] 1. 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 2. 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 3. Slight reduction in size of right retroperitoneal evolving hematoma.   03/30/2018 Imaging   Ct neck 1. Extensive adenopathy on the left. The largest node is a level 1/submandibular node measuring 38 x 24 x 22 mm. Largest level 2 node measures 3.2 x 2 x 2 cm. Numerous other smaller but round lymph nodes throughout the left neck in the level 2 through level 4 region. The largest level 5 node measures 3.4 x 3.4 x 2.3 cm with a transverse diameter of 2.3 cm. This contains some internal calcifications. There are numerous other pathologic nodes in the left supraclavicular to axillary region. This  pattern of disease could be due to extensive left neck metastatic disease from unknown primary, lymphoma, or other systemic malignancy. 2. No evidence of mucosal or submucosal lesion. 3. Diameter of the ascending aorta is  4.5 cm. Recommend annual imaging followup by CTA or MRA. Aortic Atherosclerosis (ICD10-I70.0).    04/10/2018 Pathology Results   Left zone 1 neck mass, [name], [name], the findings indicate an atypical lymphoid proliferation but are too limited for definitive diagnosis. Excisional biopsy with flow cytometry is recommended.   04/10/2018 Procedure   He was ENT who performed FNA of neck LN   04/27/2018 Pathology Results   Lymph node for lymphoma, [name], [name], [name], [name], [name], [name], [name], the features are consistent with a mantle cell lymphoma   05/13/2018 PET scan   1. Hypermetabolic left cervical lymphadenopathy, left axillary lymphadenopathy,  mediastinal / right hilar lymphadenopathy, right external iliac lymphadenopathy, and subcentimeter left external iliac and left inguinal lymph nodes, [name], [name], [name], without focal lesions, that may represent lymphomatous involvement of the spleen.   05/21/2018 Cancer Staging   Staging form: Hodgkin and Non-Hodgkin Lymphoma, AJCC 8th Edition - Clinical: Stage III - Signed by Heath Lark, MD on 05/21/2018   05/26/2018 Procedure   Ultrasound and fluoroscopically guided right internal jugular single lumen power port catheter insertion. Tip in the SVC/RA junction. Catheter ready for use.    06/01/2018 -  Chemotherapy   The patient had Bendamustine and Rituximab for chemotherapy treatment   08/24/2018 PET scan   1. Partial response to therapy with complete resolution of size and metabolic activity of the dominant LEFT submandibular node (level 1) and LEFT axillary nodes. 2. Persistent and increased metabolic activity of left level 3 lymph nodes ( Deauville 4). Nodes are partially calcified and similar size. 3. No new metastatic disease. 4. Normal spleen and bone marrow.   11/09/2018 PET scan   IMPRESSION: 1. Mild response to therapy of hypermetabolic left cervical nodes. (Deauville) 4. 2. No new or progressive disease. 3. Coronary artery atherosclerosis. Aortic Atherosclerosis (ICD10-I70.0). 4. Trace cul-de-sac fluid, similar.   12/18/2018 Imaging   1. 4 mm solid nodule in the right lower lobe, which is technically indeterminate. Recommend attention on follow-up per clinical protocol. 2. Small calcified nodule adjacent to the left lobe of the thyroid, which may represent thyroid nodule or calcified lymph node. Recommend correlation with thyroid ultrasound. 3. The  ascending aorta appears mildly dilated, measuring up to 4.2cm. 4. Decreased left axillary  lymphadenopathy.   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   Stranding with postsurgical changes along the right anterior neck anterior to the thyroid cartilage, favored to be related to recent surgery. Attention on follow-up is suggested.   No suspicious lymphadenopathy in the neck, chest, abdomen, or pelvis. Deauville criteria 1   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([place])  11/23/2018 Pathology Results   Lymph node for lymphoma, Left zone 3 - PAPILLARY THYROID CARCINOMA. - SEE MICROSCOPIC DESCRIPTION. Microscopic Comment The specimen consists mostly of encapsulated papillary thyroid carcinoma. There are portions of lymphoid tissue attached to the periphery of the specimen and in the adjacent adipose tissue which suggests that this may represent papillary carcinoma metastatic to a lymph node.   11/23/2018 Surgery   PREOPERATIVE DIAGNOSIS:  Mantle cell lymphoma and cervical lymphadenopathy.   POSTOPERATIVE DIAGNOSIS:  Mantle cell lymphoma and cervical lymphadenopathy.   PROCEDURE:  Excisional biopsy of left cervical lymph nodes.     01/15/2019 Pathology Results   A. SOFT TISSUE, NECK, ADJACENT TO LEFT RECURRENT NERVE, BIOPSY: - Papillary thyroid carcinoma. B. 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 C. ADDITIONAL LEFT LEVEL 4 TISSUE, EXCISION: - There is no evidence of carcinoma in 2 of 2 lymph nodes (0/2). D. TOTAL THYROIDECTOMY: - Papillary thyroid carcinoma, 0.8 cm. - Carcinoma is broadly present at an inked tissue edge. - See oncology table below. E. LEFT LEVEL 6 TISSUE, EXCISION: - Benign fibroadipose tissue. - There is no evidence of malignancy. F. LEFT RECURRENT NERVE AND SURROUNDING TISSUE, EXCISION: - Metastatic papillary thyroid carcinoma in 3 of 3 lymph nodes (3/3).  THYROID GLAND: Procedure:  Thyroidectomy Tumor Focality: Unifocal Tumor Site: Left lobe Tumor Size: 0.8 cm Histologic Type: 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Margins: Carcinoma is broadly present at a black inked tissue edge. 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   PROCEDURE:  Total thyroidectomy and left modified radical neck dissection.   SURGEON:  Melida Quitter, MD     FINDINGS:  There was a firm nodule in zone IIB on the left side and also some firm tissue and adherence to the jugular vein in zone III region requiring removal of a portion of the vein.  There was a firm nodule in the left thyroid lobe that extended posterior to the gland, adhering to the trachea, and encompassing the recurrent laryngeal nerve.  Tissue was sent for frozen section from adjacent to the nerve, demonstrating papillary carcinoma.  The nerve stimulated distal to the mass but not proximal to the mass; thus, the nerve was removed with the mass.  The left-sided parathyroid glands were visualized, as was the right superior parathyroid gland.  The right recurrent nerve was kept safe and stimulated well at the end of the case.     REVIEW OF SYSTEMS:  Constitutional: Denies fevers, chills or abnormal weight loss Eyes: Denies blurriness of vision Ears, nose, mouth, throat, and face: Denies mucositis or sore throat Respiratory: Denies cough, dyspnea or  wheezes Cardiovascular: Denies palpitation, chest discomfort or lower extremity swelling Gastrointestinal:  Denies nausea, heartburn or change in bowel habits Skin: Denies abnormal skin rashes Lymphatics: Denies new lymphadenopathy or easy bruising Neurological:Denies numbness, tingling or new weaknesses Behavioral/Psych: Mood is stable, no new changes  All other systems were reviewed with the patient and are negative.  I have reviewed the past medical history, past surgical history, social history and family history with the patient and they are unchanged from previous note.  ALLERGIES:  is allergic to aspirin; levaquin [levofloxacin in d5w]; nickel; other; statins; allopurinol; and ampicillin.  MEDICATIONS:  Current Outpatient Medications  Medication Sig Dispense Refill  . acetaminophen (TYLENOL) 500 MG tablet Take 1,000 mg by mouth daily as needed for moderate pain.    Marland Kitchen acyclovir (ZOVIRAX) 400 MG tablet Take 1 tablet (400 mg total) by mouth daily at 12 noon. 90 tablet 3  . alfuzosin (UROXATRAL) 10 MG 24 hr tablet Take 10 mg by mouth at bedtime.     Marland Kitchen buPROPion (WELLBUTRIN XL) 150 MG 24 hr tablet TAKE 1 TABLET DAILY (Patient taking differently: Take 150 mg by mouth daily. ) 90 tablet 3  . Cholecalciferol (VITAMIN D) 2000 units tablet Take 2,000 Units by mouth at bedtime.     Marland Kitchen ezetimibe (ZETIA) 10 MG tablet Take 10 mg by mouth at bedtime.    . finasteride (PROSCAR) 5 MG tablet Take 5 mg by mouth at bedtime.     Marland Kitchen levothyroxine (SYNTHROID) 175 MCG tablet Take 1 tablet (175 mcg total) by mouth daily.    Marland Kitchen lidocaine-prilocaine (EMLA) cream Apply to affected area once 30 g 3  . metoprolol succinate (TOPROL-XL) 25 MG 24 hr tablet Take 12.5 mg by mouth daily.     Vladimir Faster Glycol-Propyl Glycol (SYSTANE HYDRATION PF OP) Place 1 drop into both eyes 3 (three) times daily as needed (dry eyes).      No current facility-administered medications for this visit.    PHYSICAL EXAMINATION: ECOG  PERFORMANCE STATUS: 0 - Asymptomatic  Vitals:   06/07/19 0805  BP: 122/71  Pulse: 82  Resp: 18  Temp: 98.5 F (36.9 C)  SpO2: 96%   Filed Weights   06/07/19 0805  Weight: 221 lb 12.8 oz (100.6 kg)    GENERAL:alert, no distress and comfortable NEURO: alert & oriented x 3 with fluent speech, no focal motor/sensory deficits  LABORATORY DATA:  I have reviewed the data as listed    Component Value Date/Time   NA 143 06/07/2019 0745   K 3.9 06/07/2019 0745   CL 108 06/07/2019 0745   CO2 25 06/07/2019 0745   GLUCOSE 104 (H) 06/07/2019 0745   BUN 24 (H) 06/07/2019 0745   CREATININE 1.30 (H) 06/07/2019 0745   CALCIUM 8.8 (L) 06/07/2019 0745   PROT 6.3 (L) 06/07/2019 0745   PROT 6.6 07/23/2017 1504   ALBUMIN 4.0 06/07/2019 0745   AST 17 06/07/2019 0745   ALT 16 06/07/2019 0745   ALKPHOS 58 06/07/2019 0745   BILITOT 1.2 06/07/2019 0745   GFRNONAA 52 (L) 06/07/2019 0745   GFRAA >60 06/07/2019 0745    No results found for: SPEP, UPEP  Lab Results  Component Value Date   WBC 4.2 06/07/2019   NEUTROABS 3.0 06/07/2019   HGB 13.1 06/07/2019   HCT 39.2 06/07/2019  MCV 93.3 06/07/2019   PLT 149 (L) 06/07/2019      Chemistry      Component Value Date/Time   NA 143 06/07/2019 0745   K 3.9 06/07/2019 0745   CL 108 06/07/2019 0745   CO2 25 06/07/2019 0745   BUN 24 (H) 06/07/2019 0745   CREATININE 1.30 (H) 06/07/2019 0745      Component Value Date/Time   CALCIUM 8.8 (L) 06/07/2019 0745   ALKPHOS 58 06/07/2019 0745   AST 17 06/07/2019 0745   ALT 16 06/07/2019 0745   BILITOT 1.2 06/07/2019 0745       RADIOGRAPHIC STUDIES: I have reviewed multiple imaging studies with the patient and his wife I have personally reviewed the radiological images as listed and agreed with the findings in the report. NM PET Image Restag (PS) Skull Base To Thigh  Result Date: 05/24/2019 CLINICAL DATA:  Subsequent treatment strategy for lymphoma. Status post thyroidectomy with left neck  dissection on 01/15/2019. Status post radioactive iodine therapy on 03/15/2019. Status post laryngoplasty with unilateral medialization on 05/06/2019. EXAM: NUCLEAR MEDICINE PET SKULL BASE TO THIGH TECHNIQUE: 10.6 mCi F-18 FDG was injected intravenously. Full-ring PET imaging was performed from the skull base to thigh after the radiotracer. CT data was obtained and used for attenuation correction and anatomic localization. Fasting blood glucose: 88 mg/dl COMPARISON:  11/09/2018 FINDINGS: Mediastinal blood pool activity: SUV max 2.7 Liver activity: SUV max 4.1 NECK: No hypermetabolic cervical lymphadenopathy. Stranding with fluid along the right anterior neck anterior to the thyroid cartilage (series 4/image 46), favoring postsurgical change in this patient status post recent laryngoplasty with unilateral medialization. Max SUV 7.3. Focal hypermetabolism along the right vocal cord, max SUV 12.7. Suspected left vocal cord paralysis, suggesting that this appearance is physiologic. Incidental CT findings: none CHEST: No hypermetabolic thoracic lymphadenopathy. No suspicious pulmonary nodules. Incidental CT findings: Right chest port terminates in the mid SVC. Atherosclerotic calcifications of the aortic arch. Mild three-vessel coronary atherosclerosis. ABDOMEN/PELVIS: No hypermetabolic mediastinal lymphadenopathy. No abnormal hypermetabolism in the liver, spleen, pancreas, or adrenal glands. Representative splenic hypermetabolism 3.1. Incidental CT findings: Status post cholecystectomy. Atherosclerotic calcifications the abdominal aorta and branch vessels. Trace pelvic fluid. Prostatomegaly. SKELETON: No focal hypermetabolic activity to suggest skeletal metastasis. Incidental CT findings: Degenerative changes of the visualized thoracolumbar spine. 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 Stranding with postsurgical changes along the right  anterior neck anterior to the thyroid cartilage, favored to be related to recent surgery. Attention on follow-up is suggested. No suspicious lymphadenopathy in the neck, chest, abdomen, or pelvis. Deauville criteria 1. Electronically Signed   By: Julian Hy M.D.   On: 05/24/2019 14:00

## 2019-06-08 NOTE — Assessment & Plan Note (Signed)
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

## 2019-06-14 DIAGNOSIS — J3801 Paralysis of vocal cords and larynx, unilateral: Secondary | ICD-10-CM | POA: Diagnosis not present

## 2019-06-14 DIAGNOSIS — Z85850 Personal history of malignant neoplasm of thyroid: Secondary | ICD-10-CM | POA: Diagnosis not present

## 2019-06-14 DIAGNOSIS — R490 Dysphonia: Secondary | ICD-10-CM | POA: Diagnosis not present

## 2019-06-14 DIAGNOSIS — J383 Other diseases of vocal cords: Secondary | ICD-10-CM | POA: Diagnosis not present

## 2019-06-14 DIAGNOSIS — R1313 Dysphagia, pharyngeal phase: Secondary | ICD-10-CM | POA: Diagnosis not present

## 2019-06-15 ENCOUNTER — Telehealth: Payer: Self-pay

## 2019-06-15 ENCOUNTER — Other Ambulatory Visit: Payer: Self-pay | Admitting: Hematology and Oncology

## 2019-06-15 DIAGNOSIS — I890 Lymphedema, not elsewhere classified: Secondary | ICD-10-CM

## 2019-06-15 NOTE — Telephone Encounter (Signed)
Wife called and left a message asking if you could send a order/referral for therapy for the lymphedema to the same place as before. They need a new order since he stopped when he had surgery.

## 2019-06-15 NOTE — Telephone Encounter (Signed)
I sent him to neurorehab in the past because he had speech problem Now for lymphedema only I think I will send him to cancer rehab When you call [name], did he decide yet whether he wants to just be observed? He was not 100% committed in our last visit If yes, I will schedule labs, flush and follow-up

## 2019-06-15 NOTE — Telephone Encounter (Signed)
Called wife back and given below message. She verbalized understanding.  They are still trying to decide on being observed. She will call back when they make a decision on future appointments.

## 2019-06-17 DIAGNOSIS — C8310 Mantle cell lymphoma, unspecified site: Secondary | ICD-10-CM | POA: Diagnosis not present

## 2019-06-17 DIAGNOSIS — Z9221 Personal history of antineoplastic chemotherapy: Secondary | ICD-10-CM | POA: Diagnosis not present

## 2019-06-18 DIAGNOSIS — H40013 Open angle with borderline findings, low risk, bilateral: Secondary | ICD-10-CM | POA: Diagnosis not present

## 2019-06-18 DIAGNOSIS — H04123 Dry eye syndrome of bilateral lacrimal glands: Secondary | ICD-10-CM | POA: Diagnosis not present

## 2019-06-21 ENCOUNTER — Telehealth: Payer: Self-pay | Admitting: Hematology and Oncology

## 2019-06-21 ENCOUNTER — Other Ambulatory Visit: Payer: Self-pay

## 2019-06-21 ENCOUNTER — Encounter: Payer: Self-pay | Admitting: Hematology and Oncology

## 2019-06-21 ENCOUNTER — Ambulatory Visit: Payer: Medicare Other | Attending: Hematology and Oncology | Admitting: Physical Therapy

## 2019-06-21 DIAGNOSIS — M542 Cervicalgia: Secondary | ICD-10-CM | POA: Diagnosis not present

## 2019-06-21 DIAGNOSIS — R293 Abnormal posture: Secondary | ICD-10-CM | POA: Diagnosis not present

## 2019-06-21 DIAGNOSIS — Z483 Aftercare following surgery for neoplasm: Secondary | ICD-10-CM | POA: Diagnosis not present

## 2019-06-21 DIAGNOSIS — M6281 Muscle weakness (generalized): Secondary | ICD-10-CM | POA: Diagnosis not present

## 2019-06-21 DIAGNOSIS — R490 Dysphonia: Secondary | ICD-10-CM | POA: Diagnosis not present

## 2019-06-21 DIAGNOSIS — J3801 Paralysis of vocal cords and larynx, unilateral: Secondary | ICD-10-CM | POA: Diagnosis not present

## 2019-06-21 NOTE — Telephone Encounter (Signed)
Scheduled appt per 3/29 sch message- wife aware of appts

## 2019-06-21 NOTE — Therapy (Signed)
[place], [place], [address] Phone: [phone]   Fax:  [phone]  Physical Therapy Re-Evaluation  Patient Details  Name: Sean Stanley MRN: [id number] Date of Birth: 04/30/1940 Referring Provider (PT): Alvy Bimler   Encounter Date: 06/21/2019  PT End of Session - 06/21/19 1025    Visit Number  6    Number of Visits  15    Date for PT Re-Evaluation  07/23/19    PT Start Time  0800    PT Stop Time  0850    PT Time Calculation (min)  50 min    Activity Tolerance  Patient tolerated treatment well    Behavior During Therapy  [facility] for tasks assessed/performed       Past Medical History:  Diagnosis Date  . 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 Atrial flutter ([place])   . BPH (benign prostatic hyperplasia)   . Cancer ([place])    melanoma on lower right back and left chest - surgically removed and cleared  . Dysrhythmia   . 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 Headache    migraine- not current  . 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 History of blood transfusion   . Hypercholesteremia   . Hypercholesterolemia   . Lymphoma ([place]) 04/2018  . Mitral valve disease    annuloplasty 2002 [facility]  . Neuropathy   . Neuropathy of both feet    pt states due to exposure to [employer]  . OSA (obstructive sleep apnea)    does not use cpap, Dr. Maxwell Caul told him he had improved  . Pneumonia   . Thoracic ascending aortic aneurysm (HCC)    4.5 cm 03/2018 CT  . Thyroid cancer [facility])     Past Surgical History:    Procedure Laterality Date  . ABDOMINAL ANGIOGRAM  08/05/12  . aneurysm repair    . CARDIOVERSION  02/12/2012   Procedure: CARDIOVERSION;  Surgeon: Pixie Casino, MD;  Location: [facility] ENDOSCOPY;  Service: Cardiovascular;  Laterality: N/A;  . CARDIOVERSION N/A 06/22/2013   Procedure: CARDIOVERSION;  Surgeon: [name], MD;  Location: [place];  Service: Cardiovascular;  Laterality: N/A;  . CATARACT EXTRACTION Bilateral 2018   with lens implant  . CHOLECYSTECTOMY    . COLONOSCOPY    . ESOPHAGOGASTRODUODENOSCOPY    . IR IMAGING GUIDED PORT INSERTION  05/26/2018  . LYMPH NODE BIOPSY Left 04/27/2018   Procedure: EXCISIONAL BIOPSY OF LEFT CERVICAL LYMPH NODE;  Surgeon: Melida Quitter, MD;  Location: [place];  Service: ENT;  Laterality: Left;  . LYMPH NODE BIOPSY Left 11/23/2018   Procedure: LEFT CERVICAL LYMPH NODE OPEN BIOPSY;  Surgeon: Melida Quitter, MD;  Location: [place];  Service: ENT;  Laterality: Left;  . MENISCUS REPAIR Right 2009  . MITRAL VALVE REPAIR  2002   [facility]  . NM MYOVIEW LTD  07/22/2006   no ischemia  . RADICAL NECK DISSECTION Left 01/15/2019   Procedure: LEFT NECK DISSECTION;  Surgeon: Melida Quitter, MD;  Location: [place];  Service: ENT;  Laterality: Left;  . RIGHT HEART CATH  06/19/2004   normal right heart dynamics. EF 50%  . TEE WITHOUT CARDIOVERSION  02/12/2012   Procedure: TRANSESOPHAGEAL ECHOCARDIOGRAM (TEE);  Surgeon: Pixie Casino, MD;  Location: [facility] ENDOSCOPY;  Service: Cardiovascular;  Laterality: N/A;  . TEE WITHOUT CARDIOVERSION N/A 06/22/2013   Procedure: TRANSESOPHAGEAL ECHOCARDIOGRAM (TEE);  Surgeon: [name], MD;  Location: [place];  Service: Cardiovascular;  Laterality: N/A;  . THYROIDECTOMY N/A 01/15/2019   Procedure: TOTAL THYROIDECTOMY;  Surgeon: Melida Quitter, MD;  Location: [place];  Service: ENT;  Laterality: N/A;    There were no vitals filed for this visit.   Subjective Assessment - 06/21/19 0804    Subjective  Pt comes back for  re-evaluation after a vocal cord medialization surgery in February. Pain in left side of neck , up to head and and down to collar bone. Pt is concernedabout the swelling in his neck.  He is getting Speech Therapy at [facility] and is focusing on his swallow  Pt reports he does not have the energy he used to have and can't walk as mnuch as he used to due to neuropathy in his feet that he has had for 30 years.  He wants to focus on gettig rid of his neck pain and swelling when he comes here.    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 Pt did not have radiation. In Feb 2021 pt had vocal cord medialization and is receiving speech therapy for swallowing at [facility]  Past history :12/2000 mitral valve repair, 10/2009 R knee arthroscopic repair of meniscus, 01/2012 cardioversion, 07/2012 retroperitoneal abdominal bleed, 11/2012- coiling of aneurysm- 4 abdominal aneurysms remain, 01/2013 cardioversion, 07/2013 ablation, 2018 cataract srugery, 01/2019 vocal cord/fold injections    Patient Stated Goals  to get relief of pain and swelling in his neck and make sure things are not getting out of control    Currently in Pain?  Yes   no pain a rest   Pain Location  Neck    Pain Orientation  Left    Pain Descriptors / Indicators  Tingling    Pain Type  Chronic pain    Pain Radiating Towards  no    Pain Onset  More than a month ago   pt has pain onset to touch   Pain Frequency  Constant   when touched   Aggravating Factors   light touch sometimes    Pain Relieving Factors  remove touch    Effect of Pain on Daily Activities  pt continues to work through         [place] - 06/21/19 0001      Assessment   Medical Diagnosis  papillary thyroid cancer, mantle cell lymphoma    Referring Provider (PT)   Alvy Bimler    Onset Date/Surgical Date  05/21/18    Hand Dominance  Right    Prior Therapy  none      Precautions   Precautions  Other (comment)    Precaution Comments  at risk of lymphedema      Restrictions   Weight Bearing Restrictions  No      Balance Screen   Has the patient fallen in the past 6 months  No    Has the patient  had a decrease in activity level because of a fear of falling?   No    Is the patient reluctant to leave their home because of a fear of falling?   No      Home Film/video editor residence   ;ives at [place] other    Available Help at Discharge  Family    Type of [place] Access  Level entry    Home Layout  One level      Prior Function   Level of Independence  Independent    Vocation  Retired    Leisure  lives at [employer] and wants to get started in pool therapy       Cognition   Overall Cognitive Status  Within Functional Limits for tasks assessed      Observation/Other Assessments   Observations  pt with old well healed scar at left lateral neck with newer scar at anterior neck with swelling at center of neck       Sensation   Additional Comments  pt reports peripheral neuropathy in both feet that has been there for 30 ears most likely a result of agent orange      Coordination   Gross Motor Movements are Fluid and Coordinated  Yes      Posture/Postural Control   Posture/Postural Control  Postural limitations    Postural Limitations  Rounded Shoulders;Forward head      ROM / Strength   AROM / PROM / Strength  AROM;Strength      AROM   Overall AROM   Within functional limits for tasks performed    Overall AROM Comments  shoulders WFL    Cervical Flexion  47    Cervical Extension  45    Cervical - Right Side Bend  30    Cervical - Left Side Bend  38    Cervical - Right Rotation  45    Cervical - Left Rotation  60      Strength    Overall Strength  Deficits    Overall Strength Comments  appears to have gerneralized deconditioning, no focal deficits       Palpation   Palpation comment  pt with firmness and sensitivity on left side of neck. winces in pain with attempts to mobilize clavicle         LYMPHEDEMA/ONCOLOGY QUESTIONNAIRE - 06/21/19 0837      Lymphedema Assessments   Lymphedema Assessments  Head and Neck      Head and Neck   4 cm superior to sternal notch around neck  43.2 cm    6 cm superior to sternal notch around neck  44 cm    8 cm superior to sternal notch around neck  44.5 cm    Other  focal fullness at anterior neck                 Objective measurements completed on examination: See above findings.      [place] Adult PT Treatment/Exercise - 06/21/19 0001      Manual Therapy   Manual therapy comments  remeasurement of head and neck ROM and circumference                   PT Long Term Goals - 06/21/19 1031      PT LONG TERM GOAL #1   Title  Pt will report the fullness  in his anterior neck is decreased by 50%    Time  4    Period  Weeks    Status  New      PT LONG TERM GOAL #2   Title  Pt will improved right lateral side bending of neck to 35 degrees    Baseline  06/21/2019 30 degrees    Time  4    Period  Weeks    Status  New      PT LONG TERM GOAL #3   Title  Pt will report the sensitivity of his skin on the left side of his neck is decreaed by 50%    Time  4    Period  Weeks    Status  New      PT LONG TERM GOAL #4   Title  Pt will report a 75% improvement in tightness around left neck dissection scar to improve comfort.    Time  4    Period  Weeks    Status  On-going             Plan - 06/21/19 1026    Clinical Impression Statement  Pt had increased swelling in anterior neck after recent surgery on vocal cords and he continues to have sensitization of skin on left side of neck that is bothers him.  He is still receiving Speech Therapy at [facility]  for swallowing.  He plans to start exercising at [facility] so wants to focus on decreasing the pain and swelling in his head and neck    Comorbidities  heart problems (atrial fib with cardioversions), abdominal aneuryms    Stability/Clinical Decision Making  Stable/Uncomplicated    Clinical Decision Making  Low    Rehab Potential  Good    PT Frequency  2x / week    PT Duration  4 weeks    PT Treatment/Interventions  ADLs/Self Care Home Management;Therapeutic exercise;Manual techniques;Manual lymph drainage;Patient/family education;Scar mobilization;Passive range of motion;Orthotic Fit/Training;Taping;Compression bandaging    PT Next Visit Plan  manual work to neck  with skin desensitization techniques, MLD to neck, try kinesiotape to anterior neck    PT Home Exercise Plan  head and neck ROM exercises,Access Code: [email or web address]    Consulted and Agree with Plan of Care  Patient       Patient will benefit from skilled therapeutic intervention in order to improve the following deficits and impairments:  Pain, Increased fascial restricitons, Decreased scar mobility, Decreased range of motion, Decreased strength, Increased edema, Postural dysfunction, Increased muscle spasms  Visit Diagnosis: Cervicalgia - Plan: PT plan of care cert/re-cert  Muscle weakness (generalized) - Plan: PT plan of care cert/re-cert  Aftercare following surgery for neoplasm - Plan: PT plan of care cert/re-cert  Abnormal posture - Plan: PT plan of care cert/re-cert     Problem List Patient Active Problem List   Diagnosis Date Noted  . 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 Deficiency anemia 04/19/2019  . Vitamin D deficiency 04/19/2019  . Hypocalcemia 04/19/2019  . Acquired lymphedema 02/22/2019  . Speech and language deficits 02/22/2019  . 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([place]) 11/26/2018  . Lymphoma ([place]) 11/23/2018  . Hyperlipidemia 11/03/2018  . Neck discomfort 09/23/2018  . Other constipation 08/26/2018    . Pancytopenia, acquired ([place]) 07/28/2018  . Mantle cell lymphoma ([place]) 05/18/2018  . Insomnia secondary to chronic pain 10/28/2017  . OSA (obstructive sleep apnea) 10/28/2017  . OSA on CPAP 07/23/2017  . Neuropathy 07/23/2017  . RLS (restless legs  syndrome) 07/23/2017  . Gallstones 03/30/2013  . Palpitations 02/16/2013  . 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 Pain in limb-Abdominal 09/21/2012  . Nontraumatic retroperitoneal hematoma, spontaneous while on anticoagulant therapy 08/29/2012  . Acute post-hemorrhagic anemia 08/29/2012  . Acute GI bleeding, spontaneous  08/06/2012  . Atrial flutter, status post TE guided cardioversion November 2013 08/06/2012  . 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/ring by Dr Evelina Dun at [facility] '02 08/06/2012  . Normal coronary arteries at cath 2006 (false positive Nuc) 08/06/2012  . 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 Contrast media allergy 08/06/2012  . Acute renal insufficiency 08/06/2012  . NSVT (nonsustained ventricular tachycardia) ([place]) 08/06/2012  . Hemorrhagic shock- now stable 08/04/2012  . Coagulopathy- chronic anticoagulation with Xarelto 08/04/2012  . Bradycardia- HR 58 NSR on admission 08/04/12 08/04/2012   Donato Heinz. Owens Shark PT  Norwood Levo 06/21/2019, 10:36 AM  [place], [place], [address] Phone: [phone]   Fax:  [phone]  Name: Sean Stanley MRN: [id number] Date of Birth: August 29, 1940

## 2019-06-22 ENCOUNTER — Other Ambulatory Visit: Payer: Self-pay | Admitting: Hematology and Oncology

## 2019-06-22 DIAGNOSIS — N400 Enlarged prostate without lower urinary tract symptoms: Secondary | ICD-10-CM | POA: Diagnosis not present

## 2019-06-22 DIAGNOSIS — C8318 Mantle cell lymphoma, lymph nodes of multiple sites: Secondary | ICD-10-CM

## 2019-06-22 DIAGNOSIS — E7800 Pure hypercholesterolemia, unspecified: Secondary | ICD-10-CM | POA: Diagnosis not present

## 2019-06-23 ENCOUNTER — Encounter: Payer: Self-pay | Admitting: Hematology and Oncology

## 2019-06-29 DIAGNOSIS — R1312 Dysphagia, oropharyngeal phase: Secondary | ICD-10-CM | POA: Diagnosis not present

## 2019-06-29 DIAGNOSIS — J383 Other diseases of vocal cords: Secondary | ICD-10-CM | POA: Diagnosis not present

## 2019-06-29 DIAGNOSIS — J3801 Paralysis of vocal cords and larynx, unilateral: Secondary | ICD-10-CM | POA: Diagnosis not present

## 2019-06-29 DIAGNOSIS — Z85850 Personal history of malignant neoplasm of thyroid: Secondary | ICD-10-CM | POA: Diagnosis not present

## 2019-06-29 DIAGNOSIS — R490 Dysphonia: Secondary | ICD-10-CM | POA: Diagnosis not present

## 2019-06-30 ENCOUNTER — Ambulatory Visit: Payer: Medicare Other | Attending: Hematology and Oncology

## 2019-06-30 ENCOUNTER — Other Ambulatory Visit: Payer: Self-pay

## 2019-06-30 DIAGNOSIS — M542 Cervicalgia: Secondary | ICD-10-CM | POA: Diagnosis not present

## 2019-06-30 DIAGNOSIS — M6281 Muscle weakness (generalized): Secondary | ICD-10-CM | POA: Diagnosis not present

## 2019-06-30 DIAGNOSIS — R293 Abnormal posture: Secondary | ICD-10-CM | POA: Diagnosis not present

## 2019-06-30 DIAGNOSIS — Z483 Aftercare following surgery for neoplasm: Secondary | ICD-10-CM

## 2019-06-30 NOTE — Therapy (Signed)
[place], [place], [address] Phone: [phone]   Fax:  [phone]  Physical Therapy Treatment  Patient Details  Name: Sean Stanley MRN: [id number] Date of Birth: 1941-01-15 Referring Provider (PT): Alvy Bimler   Encounter Date: 06/30/2019  PT End of Session - 06/30/19 0808    Visit Number  7    Number of Visits  15    Date for PT Re-Evaluation  07/23/19    PT Start Time  0806    PT Stop Time  0900    PT Time Calculation (min)  54 min    Activity Tolerance  Patient tolerated treatment well    Behavior During Therapy  [facility] for tasks assessed/performed       Past Medical History:  Diagnosis Date  . 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 Atrial flutter ([place])   . BPH (benign prostatic hyperplasia)   . Cancer ([place])    melanoma on lower right back and left chest - surgically removed and cleared  . Dysrhythmia   . 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 Headache    migraine- not current  . 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 History of blood transfusion   . Hypercholesteremia   . Hypercholesterolemia   . Lymphoma ([place]) 04/2018  . Mitral valve disease    annuloplasty 2002 [facility]  . Neuropathy   . Neuropathy of both feet    pt states due to exposure to [employer]  . OSA (obstructive sleep apnea)    does not use cpap, Dr. Maxwell Caul told him he had improved  . Pneumonia   . Thoracic ascending aortic aneurysm (HCC)    4.5 cm 03/2018 CT  . Thyroid cancer [facility])     Past Surgical History:  Procedure  Laterality Date  . ABDOMINAL ANGIOGRAM  08/05/12  . aneurysm repair    . CARDIOVERSION  02/12/2012   Procedure: CARDIOVERSION;  Surgeon: Pixie Casino, MD;  Location: [facility] ENDOSCOPY;  Service: Cardiovascular;  Laterality: N/A;  . CARDIOVERSION N/A 06/22/2013   Procedure: CARDIOVERSION;  Surgeon: [name], MD;  Location: [employer];  Service: Cardiovascular;  Laterality: N/A;  . CATARACT EXTRACTION Bilateral 2018   with lens implant  . CHOLECYSTECTOMY    . COLONOSCOPY    . ESOPHAGOGASTRODUODENOSCOPY    . IR IMAGING GUIDED PORT INSERTION  05/26/2018  . LYMPH NODE BIOPSY Left 04/27/2018   Procedure: EXCISIONAL BIOPSY OF LEFT CERVICAL LYMPH NODE;  Surgeon: Melida Quitter, MD;  Location: [place];  Service: ENT;  Laterality: Left;  . LYMPH NODE BIOPSY Left 11/23/2018   Procedure: LEFT CERVICAL LYMPH NODE OPEN BIOPSY;  Surgeon: Melida Quitter, MD;  Location: [place];  Service: ENT;  Laterality: Left;  . MENISCUS REPAIR Right 2009  . MITRAL VALVE REPAIR  2002   [facility]  . NM MYOVIEW LTD  07/22/2006   no ischemia  . RADICAL NECK DISSECTION Left 01/15/2019   Procedure: LEFT NECK DISSECTION;  Surgeon: Melida Quitter, MD;  Location: [place];  Service: ENT;  Laterality: Left;  . RIGHT HEART CATH  06/19/2004   normal right heart dynamics. EF 50%  . TEE WITHOUT CARDIOVERSION  02/12/2012   Procedure: TRANSESOPHAGEAL ECHOCARDIOGRAM (TEE);  Surgeon: Pixie Casino, MD;  Location: [facility] ENDOSCOPY;  Service: Cardiovascular;  Laterality: N/A;  . TEE WITHOUT CARDIOVERSION N/A 06/22/2013   Procedure: TRANSESOPHAGEAL ECHOCARDIOGRAM (TEE);  Surgeon: [name], MD;  Location: [place];  Service: Cardiovascular;  Laterality: N/A;  . THYROIDECTOMY N/A 01/15/2019   Procedure: TOTAL THYROIDECTOMY;  Surgeon: Melida Quitter, MD;  Location: [place];  Service: ENT;  Laterality: N/A;    There were no vitals filed for this visit.  Subjective Assessment - 06/30/19 0809    Subjective  Pt states that he is doing well he  has numbness in the L side of her cervical spine and tingling that comes and goes right below the collarbone on the L. He reports his exercises are going well.    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 Pt did not have radiation. In Feb 2021 pt had vocal cord medialization and is receiving speech therapy for swallowing at [facility]  Past history :12/2000 mitral valve repair, 10/2009 R knee arthroscopic repair of meniscus, 01/2012 cardioversion, 07/2012 retroperitoneal abdominal bleed, 11/2012- coiling of aneurysm- 4 abdominal aneurysms remain, 01/2013 cardioversion, 07/2013 ablation, 2018 cataract srugery, 01/2019 vocal cord/fold injections    Patient Stated Goals  to get relief of pain and swelling in his neck and make sure things are not getting out of control    Currently in Pain?  No/denies                       [facility] Adult PT Treatment/Exercise - 06/30/19 0001      Manual Therapy   Manual Therapy  Myofascial release;Manual Lymphatic Drainage (MLD);Edema management    Edema Management  Pt was educated on use of soft collar for edema control at the anterior cervcial spine and discussed using his hand for light pressure 2-3 seconds throughout the day. Also, discussed working axillary nodes to stimulate fluid flow and node activation.     Myofascial Release  alont the L lateral aspect of the cervical spine, anterior cervical/chest/sternal area with intermittent swallowing for myofascial pull due to adhesions to the thyroid cartilage; moderate improvement by end of session extra time spent here due to tenderness and pins/needles reported with fascial stretching that improved with slow stretch and over time with continued work.     Manual Lymphatic Drainage (MLD)  In reclined supine  ~60 degree elevation HOB swimming in the terminus, short neck, bil shoulder collectors, bil axillary nodes, worked bil sides of cervical spine one at a time toward shoulder collectors, anterior neck down toward axilla bil, stationary circles above and below incision, sides of the head over buccal/temporal area then worked down the sides of the cervical spine toward shoulder collectors, in sitting worked supraclavicular fossa toward posterior inter-axillary anastomosis then re-worked axilla Bil.              PT Education - 06/30/19 0903    Education Details  Pt will purchase a soft collar to wear after waking to help move fluid out of this area, he will work his axillary nodes and apply pressure to his submandibular  fossa for 2-3 seconds throughout the day to help move fluid. Discussed what fluid looks like and pt was shown the fluid in the supraclavicular fossa and near the L axilla; demonstrated how to brushing/sweeping technique to move fluid from this area toward the axilla.    Person(s) Educated  Patient    Methods  Explanation;Demonstration;Verbal cues    Comprehension  Verbalized understanding          PT Long Term Goals - 06/21/19 1031      PT LONG TERM GOAL #1   Title  Pt will report the fullness in his anterior neck is decreased by 50%    Time  4    Period  Weeks    Status  New      PT LONG TERM GOAL #2   Title  Pt will improved right lateral side bending of neck to 35 degrees    Baseline  06/21/2019 30 degrees    Time  4    Period  Weeks    Status  New      PT LONG TERM GOAL #3   Title  Pt will report the sensitivity of his skin on the left side of his neck is decreaed by 50%    Time  4    Period  Weeks    Status  New      PT LONG TERM GOAL #4   Title  Pt will report a 75% improvement in tightness around left neck dissection scar to improve comfort.    Time  4    Period  Weeks    Status  On-going            Plan - 06/30/19 0808    Clinical Impression  Statement  Pt continues with increased swelling at the anterior neck, L supraclavicular fossa and axilla; demonstrated how to using brushing with the hand to move this fluid toward the axilla. MLD was performed with good results in this area with some fluid pooling at the area superior to the incision. Myofascial tightness noted at the anterior cervical area with significant tightness noted over the thyroid cartilage; moderate improvement following myofascial release. Pt will benefit from continued POC at this time.    Personal Factors and Comorbidities  Age;Comorbidity 2    Comorbidities  heart problems (atrial fib with cardioversions), abdominal aneuryms    Examination-Participation Restrictions  Driving    Rehab Potential  Good    PT Frequency  2x / week    PT Duration  4 weeks    PT Treatment/Interventions  ADLs/Self Care Home Management;Therapeutic exercise;Manual techniques;Manual lymph drainage;Patient/family education;Scar mobilization;Passive range of motion;Orthotic Fit/Training;Taping;Compression bandaging    PT Next Visit Plan  manual work to neck  with skin desensitization techniques, MLD to neck, try kinesiotape to anterior neck    PT Home Exercise Plan  head and neck ROM exercises,Access Code: [email or web address]    Consulted and Agree with Plan of Care  Patient       Patient will benefit from skilled therapeutic intervention in order to improve the following deficits and impairments:  Pain, Increased fascial restricitons, Decreased scar mobility, Decreased range of motion, Decreased strength, Increased edema, Postural dysfunction, Increased muscle spasms  Visit Diagnosis: Cervicalgia  Muscle weakness (generalized)  Aftercare following surgery for neoplasm  Abnormal posture     Problem List Patient Active Problem List   Diagnosis Date Noted  . 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 Deficiency anemia 04/19/2019  . Vitamin D deficiency 04/19/2019  .  Hypocalcemia 04/19/2019   . Acquired lymphedema 02/22/2019  . Speech and language deficits 02/22/2019  . 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 Lymphoma ([place]) 11/23/2018  . Hyperlipidemia 11/03/2018  . Neck discomfort 09/23/2018  . Other constipation 08/26/2018  . Pancytopenia, acquired ([place]) 07/28/2018  . 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 Insomnia secondary to chronic pain 10/28/2017  . OSA (obstructive sleep apnea) 10/28/2017  . OSA on CPAP 07/23/2017  . Neuropathy 07/23/2017  . RLS (restless legs syndrome) 07/23/2017  . Gallstones 03/30/2013  . Palpitations 02/16/2013  . 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 Pain in limb-Abdominal 09/21/2012  . Nontraumatic retroperitoneal hematoma, spontaneous while on anticoagulant therapy 08/29/2012  . Acute post-hemorrhagic anemia 08/29/2012  . Acute GI bleeding, spontaneous  08/06/2012  . Atrial flutter, status post TE guided cardioversion November 2013 08/06/2012  . 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/ring by Dr Evelina Dun at [facility] '02 08/06/2012  . Normal coronary arteries at cath 2006 (false positive Nuc) 08/06/2012  . 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 Contrast media allergy 08/06/2012  . Acute renal insufficiency 08/06/2012  . NSVT (nonsustained ventricular tachycardia) ([place]) 08/06/2012  . Hemorrhagic shock- now stable 08/04/2012  . Coagulopathy- chronic anticoagulation with Xarelto 08/04/2012  . Bradycardia- HR 58 NSR on admission 08/04/12 08/04/2012    Ander Purpura, PT 06/30/2019, 11:04 AM  [place], [place], [address] Phone: [phone]   Fax:  [phone]  Name: Sean Stanley MRN: [id number] Date of Birth: Mar 25, 1941

## 2019-07-02 DIAGNOSIS — R490 Dysphonia: Secondary | ICD-10-CM | POA: Diagnosis not present

## 2019-07-02 DIAGNOSIS — J383 Other diseases of vocal cords: Secondary | ICD-10-CM | POA: Diagnosis not present

## 2019-07-02 DIAGNOSIS — J3801 Paralysis of vocal cords and larynx, unilateral: Secondary | ICD-10-CM | POA: Diagnosis not present

## 2019-07-07 ENCOUNTER — Ambulatory Visit: Payer: Medicare Other

## 2019-07-07 ENCOUNTER — Other Ambulatory Visit: Payer: Self-pay

## 2019-07-07 DIAGNOSIS — R293 Abnormal posture: Secondary | ICD-10-CM

## 2019-07-07 DIAGNOSIS — M6281 Muscle weakness (generalized): Secondary | ICD-10-CM | POA: Diagnosis not present

## 2019-07-07 DIAGNOSIS — M542 Cervicalgia: Secondary | ICD-10-CM | POA: Diagnosis not present

## 2019-07-07 DIAGNOSIS — Z483 Aftercare following surgery for neoplasm: Secondary | ICD-10-CM | POA: Diagnosis not present

## 2019-07-07 NOTE — Therapy (Signed)
[place], [place], [address] Phone: [phone]   Fax:  [phone]  Physical Therapy Treatment  Patient Details  Name: Sean Stanley MRN: [id number] Date of Birth: 1940-08-16 Referring Provider (PT): Alvy Bimler   Encounter Date: 07/07/2019  PT End of Session - 07/07/19 0807    Visit Number  8    Number of Visits  15    Date for PT Re-Evaluation  07/23/19    PT Start Time  0805    PT Stop Time  0858    PT Time Calculation (min)  53 min    Activity Tolerance  Patient tolerated treatment well    Behavior During Therapy  [facility] for tasks assessed/performed       Past Medical History:  Diagnosis Date  . 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 Atrial flutter ([place])   . BPH (benign prostatic hyperplasia)   . Cancer ([place])    melanoma on lower right back and left chest - surgically removed and cleared  . Dysrhythmia   . 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 Headache    migraine- not current  . 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 History of blood transfusion   . Hypercholesteremia   . Hypercholesterolemia   . Lymphoma ([place]) 04/2018  . Mitral valve disease    annuloplasty 2002 [facility]  . Neuropathy   . Neuropathy of both feet    pt states due to exposure to [employer]  . OSA (obstructive sleep apnea)    does not use cpap, Dr. Maxwell Caul told him he had improved  . Pneumonia   . Thoracic ascending aortic aneurysm (HCC)    4.5 cm 03/2018 CT  . Thyroid cancer [facility])     Past Surgical History:  Procedure  Laterality Date  . ABDOMINAL ANGIOGRAM  08/05/12  . aneurysm repair    . CARDIOVERSION  02/12/2012   Procedure: CARDIOVERSION;  Surgeon: Pixie Casino, MD;  Location: [facility] ENDOSCOPY;  Service: Cardiovascular;  Laterality: N/A;  . CARDIOVERSION N/A 06/22/2013   Procedure: CARDIOVERSION;  Surgeon: [name], MD;  Location: [place];  Service: Cardiovascular;  Laterality: N/A;  . CATARACT EXTRACTION Bilateral 2018   with lens implant  . CHOLECYSTECTOMY    . COLONOSCOPY    . ESOPHAGOGASTRODUODENOSCOPY    . IR IMAGING GUIDED PORT INSERTION  05/26/2018  . LYMPH NODE BIOPSY Left 04/27/2018   Procedure: EXCISIONAL BIOPSY OF LEFT CERVICAL LYMPH NODE;  Surgeon: Melida Quitter, MD;  Location: [place];  Service: ENT;  Laterality: Left;  . LYMPH NODE BIOPSY Left 11/23/2018   Procedure: LEFT CERVICAL LYMPH NODE OPEN BIOPSY;  Surgeon: Melida Quitter, MD;  Location: [place];  Service: ENT;  Laterality: Left;  . MENISCUS REPAIR Right 2009  . MITRAL VALVE REPAIR  2002   [facility]  . NM MYOVIEW LTD  07/22/2006   no ischemia  . RADICAL NECK DISSECTION Left 01/15/2019   Procedure: LEFT NECK DISSECTION;  Surgeon: Melida Quitter, MD;  Location: [place];  Service: ENT;  Laterality: Left;  . RIGHT HEART CATH  06/19/2004   normal right heart dynamics. EF 50%  . TEE WITHOUT CARDIOVERSION  02/12/2012   Procedure: TRANSESOPHAGEAL ECHOCARDIOGRAM (TEE);  Surgeon: Pixie Casino, MD;  Location: [facility] ENDOSCOPY;  Service: Cardiovascular;  Laterality: N/A;  . TEE WITHOUT CARDIOVERSION N/A 06/22/2013   Procedure: TRANSESOPHAGEAL ECHOCARDIOGRAM (TEE);  Surgeon: [name], MD;  Location: [place];  Service: Cardiovascular;  Laterality: N/A;  . THYROIDECTOMY N/A 01/15/2019   Procedure: TOTAL THYROIDECTOMY;  Surgeon: Melida Quitter, MD;  Location: [place];  Service: ENT;  Laterality: N/A;    There were no vitals filed for this visit.  Subjective Assessment - 07/07/19 0807    Subjective  Pt states that he continues to  feel numbness on the L side of his cervical spine. He states that he feels that he has no problem moving his cervical spine.    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 Pt did not have radiation. In Feb 2021 pt had vocal cord medialization and is receiving speech therapy for swallowing at [facility]  Past history :12/2000 mitral valve repair, 10/2009 R knee arthroscopic repair of meniscus, 01/2012 cardioversion, 07/2012 retroperitoneal abdominal bleed, 11/2012- coiling of aneurysm- 4 abdominal aneurysms remain, 01/2013 cardioversion, 07/2013 ablation, 2018 cataract srugery, 01/2019 vocal cord/fold injections    Patient Stated Goals  to get relief of pain and swelling in his neck and make sure things are not getting out of control    Currently in Pain?  No/denies                       [facility] Adult PT Treatment/Exercise - 07/07/19 0001      Manual Therapy   Manual Therapy  Myofascial release;Taping    Edema Management  taping for edema management with 2 I strips fanned from the cervical lymph nodes toward the incision from the R and L.     Myofascial Release  along the incision and across anterior chest fascial into the posterior trapezius and anterior central portion of the neck above and below the thyroid cartilage due to adhesions attached to the thyroid cartilage. Improvement and significant decrease in adhesions on the R side of the incision scar, continues wiht tightness at the L side of the incision and over thyroid cartilage with some improvement as demonstrated by skin is able to come up off the fascia centrally but not yet on the L; decreased fluid following myofascial release.              PT Education - 07/07/19 0903    Education Details  Pt will work on continuing to  compress his neck as needed and sweep fluid toward axilla. He will remove tape after max 3 days and educated to not apply heat directly to it. He will remove immediately if he notices any pain or itching. If he experiences trouble breathing he will contact emergency services.    Person(s) Educated  Patient    Methods  Explanation    Comprehension  Verbalized understanding          PT Long Term Goals - 06/21/19 1031      PT LONG TERM GOAL #1   Title  Pt will report the fullness in his anterior neck  is decreased by 50%    Time  4    Period  Weeks    Status  New      PT LONG TERM GOAL #2   Title  Pt will improved right lateral side bending of neck to 35 degrees    Baseline  06/21/2019 30 degrees    Time  4    Period  Weeks    Status  New      PT LONG TERM GOAL #3   Title  Pt will report the sensitivity of his skin on the left side of his neck is decreaed by 50%    Time  4    Period  Weeks    Status  New      PT LONG TERM GOAL #4   Title  Pt will report a 75% improvement in tightness around left neck dissection scar to improve comfort.    Time  4    Period  Weeks    Status  On-going            Plan - 07/07/19 0807    Clinical Impression Statement  Pt presents with continued edema in his anterior neck around incision area; improved following myofascial release. Myofascial release was performed around incision around scar on anterior with signifiant adhesison still present; improved moderately following myofascial release on the R side of incision scar and minimally on the the L side. Pt was taped with edema taping Bil using I strip fanned. Pt will benefit from continued POC at this time.    Personal Factors and Comorbidities  Age;Comorbidity 2    Comorbidities  heart problems (atrial fib with cardioversions), abdominal aneuryms    Examination-Participation Restrictions  Driving    Rehab Potential  Good    PT Frequency  2x / week    PT Duration  4 weeks    PT  Treatment/Interventions  ADLs/Self Care Home Management;Therapeutic exercise;Manual techniques;Manual lymph drainage;Patient/family education;Scar mobilization;Passive range of motion;Orthotic Fit/Training;Taping;Compression bandaging    PT Next Visit Plan  manual work to neck  with skin desensitization techniques, MLD to neck, try kinesiotape to anterior neck    PT Home Exercise Plan  head and neck ROM exercises,Access Code: [email or web address]    Consulted and Agree with Plan of Care  Patient       Patient will benefit from skilled therapeutic intervention in order to improve the following deficits and impairments:  Pain, Increased fascial restricitons, Decreased scar mobility, Decreased range of motion, Decreased strength, Increased edema, Postural dysfunction, Increased muscle spasms  Visit Diagnosis: Cervicalgia  Muscle weakness (generalized)  Aftercare following surgery for neoplasm  Abnormal posture     Problem List Patient Active Problem List   Diagnosis Date Noted  . 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 Deficiency anemia 04/19/2019  . Vitamin D deficiency 04/19/2019  . Hypocalcemia 04/19/2019  . Acquired lymphedema 02/22/2019  . Speech and language deficits 02/22/2019  . 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([place]) 11/26/2018  . Lymphoma ([place]) 11/23/2018  . Hyperlipidemia 11/03/2018  . Neck discomfort 09/23/2018  . Other constipation 08/26/2018  . Pancytopenia, acquired ([place]) 07/28/2018  . 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 Insomnia secondary to chronic pain 10/28/2017  . OSA (obstructive sleep apnea) 10/28/2017  . OSA on CPAP 07/23/2017  . Neuropathy 07/23/2017  . RLS (restless legs syndrome) 07/23/2017  . Gallstones 03/30/2013  . Palpitations 02/16/2013  . 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 Pain in limb-Abdominal 09/21/2012  . Nontraumatic retroperitoneal hematoma,  spontaneous while on anticoagulant therapy 08/29/2012  . Acute post-hemorrhagic  anemia 08/29/2012  . Acute GI bleeding, spontaneous  08/06/2012  . Atrial flutter, status post TE guided cardioversion November 2013 08/06/2012  . 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/ring by Dr Evelina Dun at [facility] '02 08/06/2012  . Normal coronary arteries at cath 2006 (false positive Nuc) 08/06/2012  . 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 Contrast media allergy 08/06/2012  . Acute renal insufficiency 08/06/2012  . NSVT (nonsustained ventricular tachycardia) ([place]) 08/06/2012  . Hemorrhagic shock- now stable 08/04/2012  . Coagulopathy- chronic anticoagulation with Xarelto 08/04/2012  . Bradycardia- HR 58 NSR on admission 08/04/12 08/04/2012    Ander Purpura, PT 07/07/2019, 10:59 AM  [place], [place], [address] Phone: [phone]   Fax:  [phone]  Name: KOLAWOLE KIMBALL MRN: [id number] Date of Birth: 1940-08-17

## 2019-07-09 DIAGNOSIS — J3801 Paralysis of vocal cords and larynx, unilateral: Secondary | ICD-10-CM | POA: Diagnosis not present

## 2019-07-09 DIAGNOSIS — Z85850 Personal history of malignant neoplasm of thyroid: Secondary | ICD-10-CM | POA: Diagnosis not present

## 2019-07-09 DIAGNOSIS — R1313 Dysphagia, pharyngeal phase: Secondary | ICD-10-CM | POA: Diagnosis not present

## 2019-07-14 ENCOUNTER — Ambulatory Visit: Payer: Medicare Other

## 2019-07-14 ENCOUNTER — Other Ambulatory Visit: Payer: Self-pay

## 2019-07-14 DIAGNOSIS — R293 Abnormal posture: Secondary | ICD-10-CM

## 2019-07-14 DIAGNOSIS — M6281 Muscle weakness (generalized): Secondary | ICD-10-CM | POA: Diagnosis not present

## 2019-07-14 DIAGNOSIS — Z483 Aftercare following surgery for neoplasm: Secondary | ICD-10-CM

## 2019-07-14 DIAGNOSIS — M542 Cervicalgia: Secondary | ICD-10-CM | POA: Diagnosis not present

## 2019-07-14 NOTE — Therapy (Signed)
[place], [place], [address] Phone: [phone]   Fax:  [phone]  Physical Therapy Treatment  Patient Details  Name: Sean Stanley MRN: [id number] Date of Birth: October 09, 1940 Referring Provider (PT): Alvy Bimler   Encounter Date: 07/14/2019  PT End of Session - 07/14/19 0904    Visit Number  9    Number of Visits  15    Date for PT Re-Evaluation  07/23/19    PT Start Time  0801    PT Stop Time  0905    PT Time Calculation (min)  64 min    Activity Tolerance  Patient tolerated treatment well    Behavior During Therapy  [facility] for tasks assessed/performed       Past Medical History:  Diagnosis Date  . 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 Atrial flutter ([place])   . BPH (benign prostatic hyperplasia)   . Cancer ([place])    melanoma on lower right back and left chest - surgically removed and cleared  . Dysrhythmia   . 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 Headache    migraine- not current  . 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 History of blood transfusion   . Hypercholesteremia   . Hypercholesterolemia   . Lymphoma ([place]) 04/2018  . Mitral valve disease    annuloplasty 2002 [facility]  . Neuropathy   . Neuropathy of both feet    pt states due to exposure to [employer]  . OSA (obstructive sleep apnea)    does not use cpap, Dr. Maxwell Caul told him he had improved  . Pneumonia   . Thoracic ascending aortic aneurysm (HCC)    4.5 cm 03/2018 CT  . Thyroid cancer [facility])     Past Surgical History:  Procedure  Laterality Date  . ABDOMINAL ANGIOGRAM  08/05/12  . aneurysm repair    . CARDIOVERSION  02/12/2012   Procedure: CARDIOVERSION;  Surgeon: Pixie Casino, MD;  Location: [facility] ENDOSCOPY;  Service: Cardiovascular;  Laterality: N/A;  . CARDIOVERSION N/A 06/22/2013   Procedure: CARDIOVERSION;  Surgeon: [name], MD;  Location: [place];  Service: Cardiovascular;  Laterality: N/A;  . CATARACT EXTRACTION Bilateral 2018   with lens implant  . CHOLECYSTECTOMY    . COLONOSCOPY    . ESOPHAGOGASTRODUODENOSCOPY    . IR IMAGING GUIDED PORT INSERTION  05/26/2018  . LYMPH NODE BIOPSY Left 04/27/2018   Procedure: EXCISIONAL BIOPSY OF LEFT CERVICAL LYMPH NODE;  Surgeon: Melida Quitter, MD;  Location: [place];  Service: ENT;  Laterality: Left;  . LYMPH NODE BIOPSY Left 11/23/2018   Procedure: LEFT CERVICAL LYMPH NODE OPEN BIOPSY;  Surgeon: Melida Quitter, MD;  Location: [place];  Service: ENT;  Laterality: Left;  . MENISCUS REPAIR Right 2009  . MITRAL VALVE REPAIR  2002   [facility]  . NM MYOVIEW LTD  07/22/2006   no ischemia  . RADICAL NECK DISSECTION Left 01/15/2019   Procedure: LEFT NECK DISSECTION;  Surgeon: Melida Quitter, MD;  Location: [place];  Service: ENT;  Laterality: Left;  . RIGHT HEART CATH  06/19/2004   normal right heart dynamics. EF 50%  . TEE WITHOUT CARDIOVERSION  02/12/2012   Procedure: TRANSESOPHAGEAL ECHOCARDIOGRAM (TEE);  Surgeon: Pixie Casino, MD;  Location: [facility] ENDOSCOPY;  Service: Cardiovascular;  Laterality: N/A;  . TEE WITHOUT CARDIOVERSION N/A 06/22/2013   Procedure: TRANSESOPHAGEAL ECHOCARDIOGRAM (TEE);  Surgeon: [name], MD;  Location: [place];  Service: Cardiovascular;  Laterality: N/A;  . THYROIDECTOMY N/A 01/15/2019   Procedure: TOTAL THYROIDECTOMY;  Surgeon: Melida Quitter, MD;  Location: [place];  Service: ENT;  Laterality: N/A;    There were no vitals filed for this visit.  Subjective Assessment - 07/14/19 0803    Subjective  I've been working on the scarring  at my throat and I feel like it is loosening up. I have pins and needles over my Lt clavicle when I work on that area.    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 Pt did not have radiation. In Feb 2021 pt had vocal cord medialization and is receiving speech [facility] for swallowing at [facility]  Past history :12/2000 mitral valve repair, 10/2009 R knee arthroscopic repair of meniscus, 01/2012 cardioversion, 07/2012 retroperitoneal abdominal bleed, 11/2012- coiling of aneurysm- 4 abdominal aneurysms remain, 01/2013 cardioversion, 07/2013 ablation, 2018 cataract srugery, 01/2019 vocal cord/fold injections    Patient Stated Goals  to get relief of pain and swelling in his neck and make sure things are not getting out of control    Currently in Pain?  No/denies                       [facility] Adult PT Treatment/Exercise - 07/14/19 0001      Manual Therapy   Soft tissue mobilization  With biotone to Lt SCM and suboccipital area, incorporated isometrics bilaterally in 45 degrees of rotation 5x, 5 sec holds with min increase range after     Myofascial Release  along the incision and across anterior chest fascial into the posterior trapezius and anterior central portion of the neck above and below the thyroid cartilage due to adhesions attached to the thyroid cartilage. Improvement and significant decrease in adhesions on the R side of the incision scar, continues with tightness at the L side of the incision and over thyroid cartilage with some improvement as demonstrated by skin is able to come up off the fascia centrally but not yet on the L; decreased fluid following myofascial release; also noted cording superior to clavicle near SCM origin so focused here as well    Passive ROM  Cervical bil  rotation gently to end ranges multiple times thorughout session                  PT Long Term Goals - 06/21/19 1031      PT LONG TERM GOAL #1   Title  Pt will report the fullness in his anterior neck is decreased by 50%    Time  4    Period  Weeks    Status  New      PT LONG TERM GOAL #2   Title  Pt will improved right lateral side bending of neck to 35 degrees    Baseline  06/21/2019 30 degrees    Time  4    Period  Weeks    Status  New      PT LONG TERM GOAL #3   Title  Pt will report the sensitivity of his skin on the left side of his neck is decreaed by 50%    Time  4    Period  Weeks    Status  New      PT LONG TERM GOAL #4   Title  Pt will report a 75% improvement in tightness around left neck dissection scar to improve comfort.    Time  4    Period  Weeks    Status  On-going            Plan - 07/14/19 1048    Clinical Impression Statement  Pt continues with edema in his anterior neck that seems to improve after manual therapy. His adhesions seem to be more contributing factor to swelling than it being lymphedema. Pt repors has been working on myofascial release alot at home throughout the day and is beginning to notice some improvement with incision softening. Included cervical P/ROM into bil rotation and isometrics, instructing pt in inportance of each for improving posture and muscle flexibility and how this can help promote fluid flow as well. Pt reports good undrestanding and reports neck feeling looser by end of session. Pt did not want kinesiotape today as he reports was going to swim later.    Personal Factors and Comorbidities  Age;Comorbidity 2    Comorbidities  heart problems (atrial fib with cardioversions), abdominal aneuryms    Examination-Participation Restrictions  Driving    Stability/Clinical Decision Making  Stable/Uncomplicated    Rehab Potential  Good    PT Frequency  2x / week    PT Duration  4 weeks    PT Treatment/Interventions   ADLs/Self Care Home Management;Therapeutic exercise;Manual techniques;Manual lymph drainage;Patient/family education;Scar mobilization;Passive range of motion;Orthotic Fit/Training;Taping;Compression bandaging    PT Next Visit Plan  manual work to neck  with skin desensitization techniques, MLD to neck, try kinesiotape to anterior neck    PT Home Exercise Plan  head and neck ROM exercises,Access Code: [email or web address]    Consulted and Agree with Plan of Care  Patient       Patient will benefit from skilled therapeutic intervention in order to improve the following deficits and impairments:  Pain, Increased fascial restricitons, Decreased scar mobility, Decreased range of motion, Decreased strength, Increased edema, Postural dysfunction, Increased muscle spasms  Visit Diagnosis: Cervicalgia  Muscle weakness (generalized)  Aftercare following surgery for neoplasm  Abnormal posture     Problem List Patient Active Problem List   Diagnosis Date Noted  . 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 Deficiency anemia 04/19/2019  . Vitamin D deficiency 04/19/2019  . Hypocalcemia 04/19/2019  . Acquired lymphedema 02/22/2019  . Speech and language deficits 02/22/2019  . 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([place]) 11/26/2018  . Lymphoma ([place]) 11/23/2018  . Hyperlipidemia 11/03/2018  . Neck discomfort 09/23/2018  . Other constipation 08/26/2018  . Pancytopenia, acquired ([place]) 07/28/2018  . 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 Insomnia secondary to chronic pain 10/28/2017  . OSA (obstructive sleep apnea) 10/28/2017  . OSA on CPAP 07/23/2017  . Neuropathy 07/23/2017  . RLS (restless legs syndrome) 07/23/2017  . Gallstones 03/30/2013  . Palpitations 02/16/2013  . 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 Pain in limb-Abdominal 09/21/2012  . Nontraumatic retroperitoneal hematoma, spontaneous while on anticoagulant therapy 08/29/2012  . Acute post-hemorrhagic anemia 08/29/2012  .  Acute  GI bleeding, spontaneous  08/06/2012  . Atrial flutter, status post TE guided cardioversion November 2013 08/06/2012  . 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/ring by Dr Evelina Dun at [facility] '02 08/06/2012  . Normal coronary arteries at cath 2006 (false positive Nuc) 08/06/2012  . 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 Contrast media allergy 08/06/2012  . Acute renal insufficiency 08/06/2012  . NSVT (nonsustained ventricular tachycardia) ([place]) 08/06/2012  . Hemorrhagic shock- now stable 08/04/2012  . Coagulopathy- chronic anticoagulation with Xarelto 08/04/2012  . Bradycardia- HR 58 NSR on admission 08/04/12 08/04/2012    Otelia Limes, PTA 07/14/2019, 11:07 AM  [place], [place], [address] Phone: [phone]   Fax:  [phone]  Name: Sean Stanley MRN: [id number] Date of Birth: Jun 07, 1940

## 2019-07-15 DIAGNOSIS — R1313 Dysphagia, pharyngeal phase: Secondary | ICD-10-CM | POA: Diagnosis not present

## 2019-07-15 DIAGNOSIS — R1312 Dysphagia, oropharyngeal phase: Secondary | ICD-10-CM | POA: Diagnosis not present

## 2019-07-16 DIAGNOSIS — R1313 Dysphagia, pharyngeal phase: Secondary | ICD-10-CM | POA: Diagnosis not present

## 2019-07-16 DIAGNOSIS — R490 Dysphonia: Secondary | ICD-10-CM | POA: Diagnosis not present

## 2019-07-16 DIAGNOSIS — J3801 Paralysis of vocal cords and larynx, unilateral: Secondary | ICD-10-CM | POA: Diagnosis not present

## 2019-07-16 DIAGNOSIS — J383 Other diseases of vocal cords: Secondary | ICD-10-CM | POA: Diagnosis not present

## 2019-07-20 ENCOUNTER — Ambulatory Visit: Payer: Medicare Other

## 2019-07-20 ENCOUNTER — Other Ambulatory Visit: Payer: Self-pay

## 2019-07-20 DIAGNOSIS — M6281 Muscle weakness (generalized): Secondary | ICD-10-CM | POA: Diagnosis not present

## 2019-07-20 DIAGNOSIS — F809 Developmental disorder of speech and language, unspecified: Secondary | ICD-10-CM

## 2019-07-20 DIAGNOSIS — M542 Cervicalgia: Secondary | ICD-10-CM | POA: Diagnosis not present

## 2019-07-20 DIAGNOSIS — C73 Malignant neoplasm of thyroid gland: Secondary | ICD-10-CM

## 2019-07-20 DIAGNOSIS — R293 Abnormal posture: Secondary | ICD-10-CM

## 2019-07-20 DIAGNOSIS — Z483 Aftercare following surgery for neoplasm: Secondary | ICD-10-CM | POA: Diagnosis not present

## 2019-07-20 NOTE — Therapy (Signed)
[place], [place], [address] Phone: [phone]   Fax:  [phone]  Physical Therapy Treatment  Patient Details  Name: Sean Stanley MRN: [id number] Date of Birth: 1940-04-21 Referring Provider (PT): Alvy Bimler   Encounter Date: 07/20/2019  PT End of Session - 07/20/19 0904    Visit Number  10    Number of Visits  15    Date for PT Re-Evaluation  07/23/19    PT Start Time  0801    PT Stop Time  0901    PT Time Calculation (min)  60 min    Activity Tolerance  Patient tolerated treatment well    Behavior During Therapy  [facility] for tasks assessed/performed       Past Medical History:  Diagnosis Date  . Abdominal aortic aneurysm, ruptured ([employer]) 2014   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, IR could not access culprit lesion and treated with anticoag reversal; no AAA noted on 06/13/17 CTA  . 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 Atrial flutter ([place])   . BPH (benign prostatic hyperplasia)   . Cancer ([place])    melanoma on lower right back and left chest - surgically removed and cleared  . Dysrhythmia   . 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 Headache    migraine- not current  . 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 History of blood transfusion   . Hypercholesteremia   . Hypercholesterolemia   . Lymphoma ([place]) 04/2018  . Mitral valve disease    annuloplasty 2002 [facility]  . Neuropathy   . Neuropathy of both feet    pt states due to exposure to [employer]  . OSA (obstructive sleep apnea)    does not use cpap, Dr. Maxwell Caul told him he had improved  . Pneumonia   . Thoracic ascending aortic aneurysm (HCC)    4.5 cm 03/2018 CT  . Thyroid cancer [facility])     Past Surgical History:  Procedure  Laterality Date  . ABDOMINAL ANGIOGRAM  08/05/12  . aneurysm repair    . CARDIOVERSION  02/12/2012   Procedure: CARDIOVERSION;  Surgeon: Pixie Casino, MD;  Location[facility] ENDOSCOPY;  Service: Cardiovascular;  Laterality: N/A;  . CARDIOVERSION N/A 06/22/2013   Procedure: CARDIOVERSION;  Surgeon: [name], MD;  Location: [place];  Service: Cardiovascular;  Laterality: N/A;  . CATARACT EXTRACTION Bilateral 2018   with lens implant  . CHOLECYSTECTOMY    . COLONOSCOPY    . ESOPHAGOGASTRODUODENOSCOPY    . IR IMAGING GUIDED PORT INSERTION  05/26/2018  . LYMPH NODE BIOPSY Left 04/27/2018   Procedure: EXCISIONAL BIOPSY OF LEFT CERVICAL LYMPH NODE;  Surgeon: Melida Quitter, MD;  Location: [place];  Service: ENT;  Laterality: Left;  . LYMPH NODE BIOPSY Left 11/23/2018   Procedure: LEFT CERVICAL LYMPH NODE OPEN BIOPSY;  Surgeon: Melida Quitter, MD;  Location: [place];  Service: ENT;  Laterality: Left;  . MENISCUS REPAIR Right 2009  . MITRAL VALVE REPAIR  2002   [facility]  . NM MYOVIEW LTD  07/22/2006   no ischemia  . RADICAL NECK DISSECTION Left 01/15/2019   Procedure: LEFT NECK DISSECTION;  Surgeon: Melida Quitter, MD;  Location: [place];  Service: ENT;  Laterality: Left;  . RIGHT HEART CATH  06/19/2004   normal right heart dynamics. EF 50%  . TEE WITHOUT CARDIOVERSION  02/12/2012   Procedure: TRANSESOPHAGEAL ECHOCARDIOGRAM (TEE);  Surgeon: Pixie Casino, MD;  Location: [facility] ENDOSCOPY;  Service: Cardiovascular;  Laterality: N/A;  . TEE WITHOUT CARDIOVERSION N/A 06/22/2013   Procedure: TRANSESOPHAGEAL ECHOCARDIOGRAM (TEE);  Surgeon: [name], MD;  Location: [place];  Service: Cardiovascular;  Laterality: N/A;  . THYROIDECTOMY N/A 01/15/2019   Procedure: TOTAL THYROIDECTOMY;  Surgeon: Melida Quitter, MD;  Location: [place];  Service: ENT;  Laterality: N/A;    There were no vitals filed for this visit.  Subjective Assessment - 07/20/19 1053    Subjective  I saw my surgeon at [place] last week  and I'm very frustrated since that visit. He released me along with the speech therapist there and basically said I've done all I can for you. I want my voice better and want to know what to do for that?    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 Pt did not have radiation. In Feb 2021 pt had vocal cord medialization and is receiving speech therapy for swallowing at [facility]  Past history :12/2000 mitral valve repair, 10/2009 R knee arthroscopic repair of meniscus, 01/2012 cardioversion, 07/2012 retroperitoneal abdominal bleed, 11/2012- coiling of aneurysm- 4 abdominal aneurysms remain, 01/2013 cardioversion, 07/2013 ablation, 2018 cataract srugery, 01/2019 vocal cord/fold injections    Patient Stated Goals  to get relief of pain and swelling in his neck and make sure things are not getting out of control                       [facility] Adult PT Treatment/Exercise - 07/20/19 0001      Self-Care   Self-Care  Other Self-Care Comments    Other Self-Care Comments   Spent time at beginning of session addressing pts goals and assessing current functional status. Determined pt has made great gains with PT meeting most goals and answered his questions regarding how physical therapy has helped his cervical A/ROM and lymphedema but speech therapy is what will help with his vocal cords. Pt would like to try seeing local speech therapist [name] so Inetta Fermo, PT sent request for referral to Dr. Alvy Bimler.       Manual Therapy   Soft tissue mobilization  --    Myofascial Release  along the incision and across anterior chest fascial into the posterior trapezius and anterior central portion of the neck above and below the thyroid cartilage due to adhesions attached to the thyroid cartilage. Continues with  visible and palpable cording superior to clavicle near SCM origin so focused here as well    Manual Lymphatic Drainage (MLD)  In supine with HOB slightly elevated: short and long neck, bil shoulder collectors, bil axillary nodes, superficial abdominals only, worked bil sides of cervical spine one at a time toward shoulder collectors, anterior neck down toward axilla bil, stationary circles above and below incision, sides of the head over buccal/temporal area then worked down the sides of the cervical spine toward shoulder collectors    Passive ROM  Cervical bil rotation and side bending gently to end ranges multiple times throughout session  PT Long Term Goals - 07/20/19 0804      PT LONG TERM GOAL #1   Title  Pt will report the fullness in his anterior neck is decreased by 50%    Baseline  50% improvement reported at this time-07/20/19    Status  Achieved      PT LONG TERM GOAL #2   Title  Pt will improved right lateral side bending of neck to 35 degrees    Baseline  06/21/2019 30 degrees; 38 degrees - 07/20/19    Status  Achieved      PT LONG TERM GOAL #3   Title  Pt will report the sensitivity of his skin on the left side of his neck is decreaed by 50%    Baseline  50% improvement reported with this at this time as pt reports it isn't sensitive all the time, now just when touched-07/20/19    Status  Achieved      PT LONG TERM GOAL #4   Title  Pt will report a 75% improvement in tightness around left neck dissection scar to improve comfort.    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 Explained to pt that if there is nerve damage that is able to heal this will take more time, but also answered  his question that continuing with physical theapy at this time will not furhter improve this particular issue. He is very interested in seeing speech therapist here to see if there are any new things he could try to work on improving vocal cord issue. So request for referral was sent to DrMarland Kitchen Alvy Bimler from Inetta Fermo, PT for this. Continued with focus on manual therapy to adress cervical tightness that, though improved, is still present. Also continuing with manual lymph drainage. Did encourage pt to cont with HEP and to wear his chip pack consistently to keep lymphedema maintained as he reports not doing this often. He verbalized understanding all and seemed some encouraged after session today. Did explain to him that with nerve damage more time is required to heal and seeing speech therapist may or may not be more beneficial than what he has already been doing. Pt is still willing to try.    Personal Factors and Comorbidities  Age;Comorbidity 2    Comorbidities  heart problems (atrial fib with cardioversions), abdominal aneuryms    Examination-Participation Restrictions  Driving    Stability/Clinical Decision Making  Stable/Uncomplicated    Rehab Potential  Good    PT Frequency  2x / week    PT Duration  4 weeks    PT Treatment/Interventions  ADLs/Self Care Home Management;Therapeutic exercise;Manual techniques;Manual lymph drainage;Patient/family education;Scar mobilization;Passive range of motion;Orthotic Fit/Training;Taping;Compression bandaging    PT Next Visit Plan  Probable D/C next with 10th visit note; manual work to neck  with skin desensitization techniques, MLD to neck    PT Home Exercise Plan  head and neck ROM exercises,Access Code: [email or web address]    Consulted and Agree with Plan of Care  Patient       Patient will benefit from skilled therapeutic intervention in order to improve the following deficits and impairments:  Pain, Increased  fascial restricitons, Decreased scar mobility, Decreased  range of motion, Decreased strength, Increased edema, Postural dysfunction, Increased muscle spasms  Visit Diagnosis: Cervicalgia  Muscle weakness (generalized)  Aftercare following surgery for neoplasm  Abnormal posture     Problem List Patient Active Problem List   Diagnosis Date Noted  . 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 Deficiency anemia 04/19/2019  . Vitamin D deficiency 04/19/2019  . Hypocalcemia 04/19/2019  . Acquired lymphedema 02/22/2019  . Speech and language deficits 02/22/2019  . 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([place]) 11/26/2018  . Lymphoma ([place]) 11/23/2018  . Hyperlipidemia 11/03/2018  . Neck discomfort 09/23/2018  . Other constipation 08/26/2018  . Pancytopenia, acquired ([place]) 07/28/2018  . 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 Insomnia secondary to chronic pain 10/28/2017  . OSA (obstructive sleep apnea) 10/28/2017  . OSA on CPAP 07/23/2017  . Neuropathy 07/23/2017  . RLS (restless legs syndrome) 07/23/2017  . Gallstones 03/30/2013  . Palpitations 02/16/2013  . 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 Pain in limb-Abdominal 09/21/2012  . Nontraumatic retroperitoneal hematoma, spontaneous while on anticoagulant therapy 08/29/2012  . Acute post-hemorrhagic anemia 08/29/2012  . Acute GI bleeding, spontaneous  08/06/2012  . Atrial flutter, status post TE guided cardioversion November 2013 08/06/2012  . 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/ring by Dr Evelina Dun at [facility] '02 08/06/2012  . Normal coronary arteries at cath 2006 (false positive Nuc) 08/06/2012  . 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 Contrast media allergy 08/06/2012  . Acute renal insufficiency 08/06/2012  . NSVT (nonsustained ventricular tachycardia) ([place]) 08/06/2012  . Hemorrhagic shock- now stable 08/04/2012  . Coagulopathy- chronic anticoagulation with Xarelto 08/04/2012  . Bradycardia- HR 58 NSR on admission 08/04/12 08/04/2012    Otelia Limes, PTA 07/20/2019, 11:08 AM  [place], [place], [address] Phone: [phone]   Fax:  [phone]  Name: Sean Stanley MRN: [id number] Date of Birth: 05-01-1940

## 2019-07-22 ENCOUNTER — Ambulatory Visit: Payer: Medicare Other

## 2019-07-22 ENCOUNTER — Other Ambulatory Visit: Payer: Self-pay

## 2019-07-22 DIAGNOSIS — R293 Abnormal posture: Secondary | ICD-10-CM | POA: Diagnosis not present

## 2019-07-22 DIAGNOSIS — M542 Cervicalgia: Secondary | ICD-10-CM

## 2019-07-22 DIAGNOSIS — Z483 Aftercare following surgery for neoplasm: Secondary | ICD-10-CM

## 2019-07-22 DIAGNOSIS — M6281 Muscle weakness (generalized): Secondary | ICD-10-CM | POA: Diagnosis not present

## 2019-07-22 NOTE — Therapy (Signed)
[place], [place], [address] Phone: [phone]   Fax:  [phone]  Physical Therapy Treatment  Patient Details  Name: Sean Stanley MRN: [id number] Date of Birth: 11-23-1940 Referring Provider (PT): Alvy Bimler   Encounter Date: 07/22/2019  PT End of Session - 07/22/19 0902    Visit Number  11    Number of Visits  115    Date for PT Re-Evaluation  07/23/19    PT Start Time  0801    PT Stop Time  0856    PT Time Calculation (min)  55 min    Activity Tolerance  Patient tolerated treatment well    Behavior During Therapy  [facility] for tasks assessed/performed       Past Medical History:  Diagnosis Date  . 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 Atrial flutter ([place])   . BPH (benign prostatic hyperplasia)   . Cancer ([place])    melanoma on lower right back and left chest - surgically removed and cleared  . Dysrhythmia   . 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 Headache    migraine- not current  . 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 History of blood transfusion   . Hypercholesteremia   . Hypercholesterolemia   . Lymphoma ([place]) 04/2018  . Mitral valve disease    annuloplasty 2002 [facility]  . Neuropathy   . Neuropathy of both feet    pt states due to exposure to [employer]  . OSA (obstructive sleep apnea)    does not use cpap, Dr. Maxwell Caul told him he had improved  . Pneumonia   . Thoracic ascending aortic aneurysm (HCC)    4.5 cm 03/2018 CT  . Thyroid cancer [facility])     Past Surgical History:   Procedure Laterality Date  . ABDOMINAL ANGIOGRAM  08/05/12  . aneurysm repair    . CARDIOVERSION  02/12/2012   Procedure: CARDIOVERSION;  Surgeon: Pixie Casino, MD;  Location[facility] ENDOSCOPY;  Service: Cardiovascular;  Laterality: N/A;  . CARDIOVERSION N/A 06/22/2013   Procedure: CARDIOVERSION;  Surgeon: [name], MD;  Location: [place];  Service: Cardiovascular;  Laterality: N/A;  . CATARACT EXTRACTION Bilateral 2018   with lens implant  . CHOLECYSTECTOMY    . COLONOSCOPY    . ESOPHAGOGASTRODUODENOSCOPY    . IR IMAGING GUIDED PORT INSERTION  05/26/2018  . LYMPH NODE BIOPSY Left 04/27/2018   Procedure: EXCISIONAL BIOPSY OF LEFT CERVICAL LYMPH NODE;  Surgeon: Melida Quitter, MD;  Location: [place];  Service: ENT;  Laterality: Left;  . LYMPH NODE BIOPSY Left 11/23/2018   Procedure: LEFT CERVICAL LYMPH NODE OPEN BIOPSY;  Surgeon: Melida Quitter, MD;  Location: [place];  Service: ENT;  Laterality: Left;  . MENISCUS REPAIR Right 2009  . MITRAL VALVE REPAIR  2002   [facility]  . NM MYOVIEW LTD  07/22/2006   no ischemia  . RADICAL NECK DISSECTION Left 01/15/2019   Procedure: LEFT NECK DISSECTION;  Surgeon: Melida Quitter, MD;  Location: [place];  Service: ENT;  Laterality: Left;  . RIGHT HEART CATH  06/19/2004   normal right heart dynamics. EF 50%  . TEE WITHOUT CARDIOVERSION  02/12/2012   Procedure: TRANSESOPHAGEAL ECHOCARDIOGRAM (TEE);  Surgeon: Pixie Casino, MD;  Location: [facility] ENDOSCOPY;  Service: Cardiovascular;  Laterality: N/A;  . TEE WITHOUT CARDIOVERSION N/A 06/22/2013   Procedure: TRANSESOPHAGEAL ECHOCARDIOGRAM (TEE);  Surgeon: [name], MD;  Location: [place];  Service: Cardiovascular;  Laterality: N/A;  . THYROIDECTOMY N/A 01/15/2019   Procedure: TOTAL THYROIDECTOMY;  Surgeon: Melida Quitter, MD;  Location: [place];  Service: ENT;  Laterality: N/A;    There were no vitals filed for this visit.                    [facility] Adult PT Treatment/Exercise -  07/22/19 0001      Manual Therapy   Myofascial Release  along the incision and anterior central portion of the neck above and below the thyroid cartilage due to adhesions attached to the thyroid cartilage.    Manual Lymphatic Drainage (MLD)  In supine with HOB slightly elevated: short and long neck, bil shoulder collectors, bil axillary nodes, superficial abdominals only, worked bil sides of cervical spine one at a time toward shoulder collectors, anterior neck down toward axilla bil, stationary circles above and below incision, sides of the head over buccal/temporal area then worked down the sides of the cervical spine toward shoulder collectors    Passive ROM  Cervical bil rotation and side bending gently to end ranges multiple times throughout session, pt had noticeable improved P/ROM with less guarding, also required less VC to relax during session today                  PT Long Term Goals - 07/20/19 0804      PT LONG TERM GOAL #1   Title  Pt will report the fullness in his anterior neck is decreased by 50%    Baseline  50% improvement reported at this time-07/20/19    Status  Achieved      PT LONG TERM GOAL #2   Title  Pt will improved right lateral side bending of neck to 35 degrees    Baseline  06/21/2019 30 degrees; 38 degrees - 07/20/19    Status  Achieved      PT LONG TERM GOAL #3   Title  Pt will report the sensitivity of his skin on the left side of his neck is decreaed by 50%    Baseline  50% improvement reported with this at this time as pt reports it isn't sensitive all the time, now just when touched-07/20/19    Status  Achieved      PT LONG TERM GOAL #4   Title  Pt will report a 75% improvement in tightness around left neck dissection scar to improve comfort.    Baseline  50% improved with this-07/20/19    Status  On-going            Plan - 07/22/19 0902    Clinical Impression Statement  Overall pt has done well with this episode of care meeting most of  his goals. Only goal not met was report of decreased tightness to being 75% improved, pt repotrs 50% at this time showing good progress. 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 Alvy Bimler after last session and pt would like to be on hold with physical  therapy at this time and work with speech therapy.    Personal Factors and Comorbidities  Age;Comorbidity 2    Comorbidities  heart problems (atrial fib with cardioversions), abdominal aneuryms    Examination-Participation Restrictions  Driving    Stability/Clinical Decision Making  Stable/Uncomplicated    Rehab Potential  Good    PT Frequency  2x / week    PT Duration  4 weeks    PT Treatment/Interventions  ADLs/Self Care Home Management;Therapeutic exercise;Manual techniques;Manual lymph drainage;Patient/family education;Scar mobilization;Passive range of motion;Orthotic Fit/Training;Taping;Compression bandaging    PT Next Visit Plan  Pt on hold as he would like to pursue speech therapy with [facility]. Pt plans to call us in next few weeks if he is unable to manage symptoms with HEP.    PT Home Exercise Plan  head and neck ROM exercises,Access Code: [email or web address]    Consulted and Agree with Plan of Care  Patient       Patient will benefit from skilled therapeutic intervention in order to improve the following deficits and impairments:  Pain, Increased fascial restricitons, Decreased scar mobility, Decreased range of motion, Decreased strength, Increased edema, Postural dysfunction, Increased muscle spasms  Visit Diagnosis: Cervicalgia  Muscle weakness (generalized)  Aftercare following surgery for neoplasm  Abnormal posture     Problem List Patient Active Problem List   Diagnosis Date Noted  . 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 Deficiency anemia 04/19/2019  . Vitamin D deficiency 04/19/2019  . Hypocalcemia 04/19/2019  . Acquired lymphedema 02/22/2019  . Speech  and language deficits 02/22/2019  . 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 Lymphoma ([place]) 11/23/2018  . Hyperlipidemia 11/03/2018  . Neck discomfort 09/23/2018  . Other constipation 08/26/2018  . Pancytopenia, acquired ([place]) 07/28/2018  . 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 Insomnia secondary to chronic pain 10/28/2017  . OSA (obstructive sleep apnea) 10/28/2017  . OSA on CPAP 07/23/2017  . Neuropathy 07/23/2017  . RLS (restless legs syndrome) 07/23/2017  . Gallstones 03/30/2013  . Palpitations 02/16/2013  . 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 Pain in limb-Abdominal 09/21/2012  . Nontraumatic retroperitoneal hematoma, spontaneous while on anticoagulant therapy 08/29/2012  . Acute post-hemorrhagic anemia 08/29/2012  . Acute GI bleeding, spontaneous  08/06/2012  . Atrial flutter, status post TE guided cardioversion November 2013 08/06/2012  . 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/ring by Dr Evelina Dun [facility] '02 08/06/2012  . Normal coronary arteries at cath 2006 (false positive Nuc) 08/06/2012  . 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 Contrast media allergy 08/06/2012  . Acute renal insufficiency 08/06/2012  . NSVT (nonsustained ventricular tachycardia) ([place]) 08/06/2012  . Hemorrhagic shock- now stable 08/04/2012  . Coagulopathy- chronic anticoagulation with Xarelto 08/04/2012  . Bradycardia- HR 58 NSR on admission 08/04/12 08/04/2012    Otelia Limes, PTA 07/22/2019, 11:35 AM  [place], [place], [address] Phone: [phone]   Fax:  [phone]  Name: Sean Stanley MRN: [id number] Date of Birth: 05-Jun-1940

## 2019-07-28 ENCOUNTER — Ambulatory Visit: Payer: Medicare Other

## 2019-07-29 DIAGNOSIS — R1313 Dysphagia, pharyngeal phase: Secondary | ICD-10-CM | POA: Diagnosis not present

## 2019-07-29 DIAGNOSIS — J3801 Paralysis of vocal cords and larynx, unilateral: Secondary | ICD-10-CM | POA: Diagnosis not present

## 2019-08-02 ENCOUNTER — Other Ambulatory Visit: Payer: Self-pay

## 2019-08-02 ENCOUNTER — Inpatient Hospital Stay: Payer: Medicare Other | Attending: Hematology and Oncology

## 2019-08-02 DIAGNOSIS — R351 Nocturia: Secondary | ICD-10-CM | POA: Diagnosis not present

## 2019-08-02 DIAGNOSIS — N401 Enlarged prostate with lower urinary tract symptoms: Secondary | ICD-10-CM | POA: Diagnosis not present

## 2019-08-02 DIAGNOSIS — C8318 Mantle cell lymphoma, lymph nodes of multiple sites: Secondary | ICD-10-CM | POA: Insufficient documentation

## 2019-08-02 DIAGNOSIS — Z452 Encounter for adjustment and management of vascular access device: Secondary | ICD-10-CM | POA: Diagnosis not present

## 2019-08-02 MED ORDER — HEPARIN SOD (PORK) LOCK FLUSH 100 UNIT/ML IV SOLN
250.0000 [IU] | Freq: Once | INTRAVENOUS | Status: AC
  Administered 2019-08-02: 500 [IU]
  Filled 2019-08-02: qty 5

## 2019-08-02 MED ORDER — SODIUM CHLORIDE 0.9% FLUSH
10.0000 mL | Freq: Once | INTRAVENOUS | Status: AC
  Administered 2019-08-02: 10 mL
  Filled 2019-08-02: qty 10

## 2019-08-02 NOTE — Patient Instructions (Signed)

## 2019-08-04 DIAGNOSIS — S90561S Insect bite (nonvenomous), right ankle, sequela: Secondary | ICD-10-CM | POA: Diagnosis not present

## 2019-08-04 DIAGNOSIS — S60562S Insect bite (nonvenomous) of left hand, sequela: Secondary | ICD-10-CM | POA: Diagnosis not present

## 2019-08-04 DIAGNOSIS — S30861A Insect bite (nonvenomous) of abdominal wall, initial encounter: Secondary | ICD-10-CM | POA: Diagnosis not present

## 2019-08-06 DIAGNOSIS — J3801 Paralysis of vocal cords and larynx, unilateral: Secondary | ICD-10-CM | POA: Diagnosis not present

## 2019-08-06 DIAGNOSIS — R490 Dysphonia: Secondary | ICD-10-CM | POA: Diagnosis not present

## 2019-08-06 DIAGNOSIS — Z85850 Personal history of malignant neoplasm of thyroid: Secondary | ICD-10-CM | POA: Diagnosis not present

## 2019-08-06 DIAGNOSIS — R1313 Dysphagia, pharyngeal phase: Secondary | ICD-10-CM | POA: Diagnosis not present

## 2019-08-16 DIAGNOSIS — N401 Enlarged prostate with lower urinary tract symptoms: Secondary | ICD-10-CM | POA: Diagnosis not present

## 2019-08-20 DIAGNOSIS — R1312 Dysphagia, oropharyngeal phase: Secondary | ICD-10-CM | POA: Diagnosis not present

## 2019-08-20 DIAGNOSIS — J3801 Paralysis of vocal cords and larynx, unilateral: Secondary | ICD-10-CM | POA: Diagnosis not present

## 2019-09-01 ENCOUNTER — Telehealth: Payer: Self-pay | Admitting: Neurology

## 2019-09-01 NOTE — Telephone Encounter (Signed)
I contacted the pt's wife and advised we would need the pt to schedule a f/u to discuss adding a new med due to the fact the pt has not been seen in over a year. Wife was advised to call back and schedule f/u.

## 2019-09-01 NOTE — Telephone Encounter (Signed)
Pt's wife Sean Stanley called stating that the pt is not taking his medications for his Neuropathy and she is wondering if he can be prescribed a topical gel for the Neuropathy. Please advise.

## 2019-09-20 NOTE — Telephone Encounter (Signed)
Called the patient's wife back. There was no answer. Left a detailed message that we need to get the patient scheduled for a follow up visit. 6/9 a VM was also left returning the call for them.   **When patient calls back please advise that an apt is needed since it has been over a year since patient has been seen.

## 2019-09-20 NOTE — Telephone Encounter (Signed)
Patient wife called stating this is the second time she has called about this and she has not got an answer back if Dr. Brett Fairy would be okay with prescribing him  something topical gel for the Neuropathy or not.

## 2019-09-28 ENCOUNTER — Other Ambulatory Visit: Payer: Self-pay

## 2019-09-28 ENCOUNTER — Inpatient Hospital Stay: Payer: Medicare Other | Attending: Hematology and Oncology

## 2019-09-28 DIAGNOSIS — C8318 Mantle cell lymphoma, lymph nodes of multiple sites: Secondary | ICD-10-CM | POA: Diagnosis not present

## 2019-09-28 DIAGNOSIS — Z452 Encounter for adjustment and management of vascular access device: Secondary | ICD-10-CM | POA: Diagnosis not present

## 2019-09-28 MED ORDER — HEPARIN SOD (PORK) LOCK FLUSH 100 UNIT/ML IV SOLN
500.0000 [IU] | Freq: Once | INTRAVENOUS | Status: AC
  Administered 2019-09-28: 500 [IU]
  Filled 2019-09-28: qty 5

## 2019-09-28 MED ORDER — SODIUM CHLORIDE 0.9% FLUSH
10.0000 mL | Freq: Once | INTRAVENOUS | Status: AC
  Administered 2019-09-28: 10 mL
  Filled 2019-09-28: qty 10

## 2019-10-02 ENCOUNTER — Other Ambulatory Visit: Payer: Self-pay | Admitting: Neurology

## 2019-10-06 DIAGNOSIS — S80862A Insect bite (nonvenomous), left lower leg, initial encounter: Secondary | ICD-10-CM | POA: Diagnosis not present

## 2019-10-06 DIAGNOSIS — D485 Neoplasm of uncertain behavior of skin: Secondary | ICD-10-CM | POA: Diagnosis not present

## 2019-10-06 DIAGNOSIS — D1801 Hemangioma of skin and subcutaneous tissue: Secondary | ICD-10-CM | POA: Diagnosis not present

## 2019-10-06 DIAGNOSIS — D225 Melanocytic nevi of trunk: Secondary | ICD-10-CM | POA: Diagnosis not present

## 2019-10-06 DIAGNOSIS — D2361 Other benign neoplasm of skin of right upper limb, including shoulder: Secondary | ICD-10-CM | POA: Diagnosis not present

## 2019-10-06 DIAGNOSIS — L821 Other seborrheic keratosis: Secondary | ICD-10-CM | POA: Diagnosis not present

## 2019-10-06 DIAGNOSIS — L814 Other melanin hyperpigmentation: Secondary | ICD-10-CM | POA: Diagnosis not present

## 2019-10-15 DIAGNOSIS — Z0000 Encounter for general adult medical examination without abnormal findings: Secondary | ICD-10-CM | POA: Diagnosis not present

## 2019-10-15 DIAGNOSIS — E7800 Pure hypercholesterolemia, unspecified: Secondary | ICD-10-CM | POA: Diagnosis not present

## 2019-10-15 DIAGNOSIS — C73 Malignant neoplasm of thyroid gland: Secondary | ICD-10-CM | POA: Diagnosis not present

## 2019-10-15 DIAGNOSIS — Z9889 Other specified postprocedural states: Secondary | ICD-10-CM | POA: Diagnosis not present

## 2019-10-15 DIAGNOSIS — Z85820 Personal history of malignant melanoma of skin: Secondary | ICD-10-CM | POA: Diagnosis not present

## 2019-10-15 DIAGNOSIS — C8310 Mantle cell lymphoma, unspecified site: Secondary | ICD-10-CM | POA: Diagnosis not present

## 2019-10-15 DIAGNOSIS — I722 Aneurysm of renal artery: Secondary | ICD-10-CM | POA: Diagnosis not present

## 2019-10-15 DIAGNOSIS — G622 Polyneuropathy due to other toxic agents: Secondary | ICD-10-CM | POA: Diagnosis not present

## 2019-10-15 DIAGNOSIS — N400 Enlarged prostate without lower urinary tract symptoms: Secondary | ICD-10-CM | POA: Diagnosis not present

## 2019-10-25 ENCOUNTER — Ambulatory Visit: Payer: Medicare Other | Admitting: Neurology

## 2019-10-27 ENCOUNTER — Encounter: Payer: Self-pay | Admitting: Internal Medicine

## 2019-10-27 ENCOUNTER — Encounter: Payer: Self-pay | Admitting: Hematology and Oncology

## 2019-11-09 DIAGNOSIS — E782 Mixed hyperlipidemia: Secondary | ICD-10-CM | POA: Diagnosis not present

## 2019-11-09 DIAGNOSIS — I340 Nonrheumatic mitral (valve) insufficiency: Secondary | ICD-10-CM | POA: Diagnosis not present

## 2019-11-09 DIAGNOSIS — I712 Thoracic aortic aneurysm, without rupture: Secondary | ICD-10-CM | POA: Diagnosis not present

## 2019-11-09 DIAGNOSIS — I714 Abdominal aortic aneurysm, without rupture: Secondary | ICD-10-CM | POA: Diagnosis not present

## 2019-11-09 DIAGNOSIS — I5032 Chronic diastolic (congestive) heart failure: Secondary | ICD-10-CM | POA: Diagnosis not present

## 2019-11-09 DIAGNOSIS — R002 Palpitations: Secondary | ICD-10-CM | POA: Diagnosis not present

## 2019-11-09 DIAGNOSIS — I493 Ventricular premature depolarization: Secondary | ICD-10-CM | POA: Diagnosis not present

## 2019-11-09 DIAGNOSIS — I4892 Unspecified atrial flutter: Secondary | ICD-10-CM | POA: Diagnosis not present

## 2019-11-09 DIAGNOSIS — Z79899 Other long term (current) drug therapy: Secondary | ICD-10-CM | POA: Diagnosis not present

## 2019-11-09 DIAGNOSIS — Z7901 Long term (current) use of anticoagulants: Secondary | ICD-10-CM | POA: Diagnosis not present

## 2019-11-09 DIAGNOSIS — I483 Typical atrial flutter: Secondary | ICD-10-CM | POA: Diagnosis not present

## 2019-11-09 DIAGNOSIS — C8310 Mantle cell lymphoma, unspecified site: Secondary | ICD-10-CM | POA: Diagnosis not present

## 2019-11-09 DIAGNOSIS — I110 Hypertensive heart disease with heart failure: Secondary | ICD-10-CM | POA: Diagnosis not present

## 2019-11-09 DIAGNOSIS — I5042 Chronic combined systolic (congestive) and diastolic (congestive) heart failure: Secondary | ICD-10-CM | POA: Diagnosis not present

## 2019-11-09 DIAGNOSIS — R0602 Shortness of breath: Secondary | ICD-10-CM | POA: Diagnosis not present

## 2019-11-09 DIAGNOSIS — G729 Myopathy, unspecified: Secondary | ICD-10-CM | POA: Diagnosis not present

## 2019-11-09 DIAGNOSIS — G629 Polyneuropathy, unspecified: Secondary | ICD-10-CM | POA: Diagnosis not present

## 2019-11-09 DIAGNOSIS — I059 Rheumatic mitral valve disease, unspecified: Secondary | ICD-10-CM | POA: Diagnosis not present

## 2019-11-17 ENCOUNTER — Ambulatory Visit (INDEPENDENT_AMBULATORY_CARE_PROVIDER_SITE_OTHER): Payer: Medicare Other | Admitting: Neurology

## 2019-11-17 ENCOUNTER — Encounter: Payer: Self-pay | Admitting: Neurology

## 2019-11-17 VITALS — BP 130/84 | HR 69 | Ht 73.0 in | Wt 218.0 lb

## 2019-11-17 DIAGNOSIS — D61818 Other pancytopenia: Secondary | ICD-10-CM | POA: Diagnosis not present

## 2019-11-17 DIAGNOSIS — G8929 Other chronic pain: Secondary | ICD-10-CM | POA: Diagnosis not present

## 2019-11-17 DIAGNOSIS — G608 Other hereditary and idiopathic neuropathies: Secondary | ICD-10-CM | POA: Diagnosis not present

## 2019-11-17 DIAGNOSIS — G4701 Insomnia due to medical condition: Secondary | ICD-10-CM | POA: Diagnosis not present

## 2019-11-17 DIAGNOSIS — F809 Developmental disorder of speech and language, unspecified: Secondary | ICD-10-CM

## 2019-11-17 DIAGNOSIS — C73 Malignant neoplasm of thyroid gland: Secondary | ICD-10-CM | POA: Diagnosis not present

## 2019-11-17 DIAGNOSIS — C8318 Mantle cell lymphoma, lymph nodes of multiple sites: Secondary | ICD-10-CM

## 2019-11-17 DIAGNOSIS — R479 Unspecified speech disturbances: Secondary | ICD-10-CM | POA: Diagnosis not present

## 2019-11-17 MED ORDER — BUPROPION HCL ER (XL) 150 MG PO TB24: 150.0000 mg | ORAL_TABLET | Freq: Every day | ORAL | 3 refills | Status: DC

## 2019-11-17 MED ORDER — LIDOCAINE-PRILOCAINE 2.5-2.5 % EX CREA: TOPICAL_CREAM | CUTANEOUS | 3 refills | Status: DC

## 2019-11-17 NOTE — Progress Notes (Signed)
SLEEP MED[facility]INE CLINIC   Provider:  Larey Seat, M D  Primary Care Physician:  [name], MD   Referring Provider: [name], MD   Chief Complaint  Patient presents with  . Follow-up    neuropathy fu, med refills needed, pt states neuropathy has worsen   . room 10    alone      I have the pleasure of seeing Mr. Sean Stanley today meanwhile 79 year old gentleman, who had a rough year.  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  Cataract surgeries in 2018 in February 2020 left neck node lymph node excision for biopsy diagnosed with mantle cell B non-Hodgkin's lymphoma treated here locally at [facility] has a Port-A-Cath has been on rituximab a drug we are very familiar with the neurology as well.  July 2020 completion of his chemotherapy and now considered in remission.  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  However he is speaking portable and in this quiet room I can hear him very well.  In May of this year he had to take bite and was treated preventively with doxycycline and in October 2021 he is planned to have an enlarged prostate procedure.  He also had a [facility] directed exam for high-frequency hearing loss.  Medication list is mildly unchanged.  He applies EMLA to the affected area,  he has to supplement thyroid hormone now with Synthroid 175 mcg daily he has dry eyes until he takes polyethylene glycol appropriately.  He is on Wellbutrin and this works out well.  Acetaminophen as needed Zetia at bedtime, Proscar at bedtime.  Has difficulties sleeping. 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 He is reluctant to speak as people lean in to hear him, and he  Worries about Covid.  Mother had parkinson's disease.     Video visit 08-2018 History of Present Illness: patient with neuropathy and recently diagnosed Lymphoma, having success with neuropathy control by walking regularly, only needing occasionally tylenol.  Observations/Objective: reports improvement in sleep duration and quality. Patient presents well groomed, in no acute distress.  Mr. Judea Riches is a 79 year old Caucasian right-handed gentleman with an underlying medical history of non-Hodgkin lymphoma, neuropathy, he was evaluated for sleep apnea which was mild and strictly positional without associated hypoxemia and there were no PLM's.  Yet his neuropathy has kept him from sleeping well and he has reported very fragmented sleep.  Dr. 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  This is a neuropathy form that can be seen with agent orange exposure, which the patient had during the [place] War.  This neuropathy has ascended over the last 25 years slowly but steadily.     RV 04-30-2018, FAIZON CAPOZZI is a 79 y.o. male , seen here as in a referral  from [name]  and upon recommendation of [name] for evaluation of neuropathy. Neuropathy has impaired his sleep. He is relieved to hear that his apnea was strictly positional, no hypoxemia and no PLMs. He had nocturia and is followed by Dr Marveen Reeks. He has just heard from his ENT physician that he has Non- Hodkins Lymphoma, a neck lymph node, which was felt and found enlarged by CT. [name]. This is a  shock. He had a MOCA today, 26/ 30 points.       HPI:  ROE WILNER is a 79 y.o. male , seen here as in a referral  from [name] and upon recommendation of [name] for evaluation of neuropathy.  Neuropathy has impaired his sleep. He is relieved to hear that his apnea was strictly positional, no hypoxemia and no PLMs. He had nocturia and is followed by Dr Marveen Reeks.   Today's 28 October 2017, and that he was summarized the patient's baseline polysomnography on 12 September 2017.  The apnea hypopnea index was 18.2, in supine position 75.8 apneas per hour, in nonsupine position 2.7/h.  There was no REM exenteration.  There was no evidence of hypoxemia time spent below 89% oxygen saturation was 0.1-minute.  The patient had only 5 periodic limb movements but many spontaneous arousals that may have been pain or discomfort related, the EKG was not keeping with a sinus rhythm he had some intermittent PACs and PVCs.  Facet the patient would not require CPAP intervention if he could train him to sleep on his side.  We discussed the tennis ball method or other behavior changing methods to help him avoid supine sleep.  I would also mentioned to say that he will sleep better once he is entrained into the sleep position.   His wife was also interested in the neuropathy work up- he has only had a low Vit D level, no autoimmune evidence. His EMG was interrepted by Dr Jannifer Franklin. No radiculopathy- he has sensory issues, pain and feels numb in both feet.  He likes the foot spa, jet massage . Wearing supportive shoes, treating pain.  Using trazodone for sleep- avoiding anticholinergic agents.  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 Wife asked about hemp topical oils.   I have the pleasure of seeing Mr. Hermann as a new patient to mostly practice on 23 Jul 2017.  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  About 25 years ago he began noticing numbness in his toes which from their ascended to the lower extremities.  It has not affected the upper extremities.  He does not have an associated skin rash.  He does not have raynaudes syndrome.  He has noted that when he is physically more active and more on his feet he will have more pain at night and sometimes also has muscle spasms in the lower extremities especially the gastrocnemius, described as a charley horse. He has been woken by these cramps.  It comes and goes, not lasting long but very, very painful.He is of [place] ancestry, [place].    Chief complaint according to patient : sleep disorder, neuropathy, agent orange exposure.  Sleep habits are as follows: The patient's bedtime is between 10 and 10:30 PM, the bedroom is cool, quiet dark. He  sleeps within 15 minutes, but wakes up for 2-3 times due to nocturia. He has increased exercise recently, feels this improved his sleep time. Fragmented sleep, total  Sleep time of 5 hours in a good night. Naps in daytime whenever he sits still. Wakes not refreshed, excessive daytime sleepiness. Wife stated he snores. -Was placed once on CPAP but quit using it. No family history of insomnia or neuropathy.   Sleep medical history and family sleep history:   Atrial fibrillation,  Dr. Esau Grew. cardioablation after many cardioversions has held up! 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  No smoking history, ETOH : drinks hard liquor, now only wine . Caffeine ; 1 cup in PM, decaffeinated- soda and no iced teas.     Review of Systems: Out of a complete 14 system review, the patient complains of only the following symptoms, and all other reviewed systems are negative.  Insomnia, painful neuropathy, thyroid cancer , Lymphoma.    Social History    Socioeconomic History  . Marital status: Married    Spouse name: [name]  . Number of children: 2  . Years of education: Not on file  . Highest education level: Not on file  Occupational History  . Occupation: retired Hydrologist  Tobacco Use  . Smoking status: Never Smoker  . Smokeless tobacco: Never Used  Vaping Use  . Vaping Use: Never used  Substance and Sexual Activity  . Alcohol use: Not Currently    Comment: social  . Drug use: No  . Sexual activity: Not on file  Other Topics Concern  . Not on file  Social History Narrative   Lives in [place], Retired   Social Determinants of Health   Financial Resource Strain:   . Difficulty of Paying Living Expenses: Not on file  Food Insecurity:   . Worried About Charity fundraiser in the Last Year: Not on file  . Ran Out of Food in the Last Year: Not on file  Transportation Needs:   . Lack of Transportation (Medical): Not on file  . Lack of Transportation (Non-Medical): Not on file  Physical Activity:   . Days of Exercise per Week: Not on file  . Minutes of Exercise per Session: Not on file  Stress:   . Feeling of Stress : Not on file  Social Connections:   . Frequency of Communication with Friends and Family: Not on file  . Frequency of Social Gatherings with Friends and Family: Not on file  . Attends Religious Services: Not on file  . Active Member of Clubs or Organizations: Not on file  . Attends Archivist Meetings: Not on file  . Marital Status: Not on file  Intimate Partner Violence:   . Fear of Current or Ex-Partner: Not on file  . Emotionally Abused: Not on file  . Physically Abused: Not on file  . Sexually Abused: Not on file    Family History  Problem Relation Age of Onset  . Parkinson's disease Mother 61  . Heart failure Father 10  . Cancer Maternal Grandmother   . Heart attack Maternal Grandfather   . Heart attack Paternal Grandmother   . Heart attack Paternal Grandfather     Past Medical  History:  Diagnosis Date  . 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 Atrial flutter ([place])   . BPH (benign prostatic hyperplasia)   . Cancer ([place])    melanoma on lower right back and left chest - surgically removed and cleared  . Dysrhythmia   . 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 Headache    migraine- not current  . 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 History of blood transfusion   . Hypercholesteremia   . Hypercholesterolemia   . Lymphoma ([place]) 04/2018  . Mitral valve disease    annuloplasty 2002 [facility]  . Neuropathy   . Neuropathy of both feet    pt states due to exposure to [employer]  . OSA (obstructive sleep apnea)    does not use cpap, Dr. Maxwell Caul told him he had improved  . Pneumonia   . Thoracic ascending aortic aneurysm ([facility])    4.5 cm 03/2018 CT  . Thyroid cancer [facility])     Past Surgical History:  Procedure Laterality Date  . ABDOMINAL ANGIOGRAM  08/05/12  . aneurysm repair    . CARDIOVERSION  02/12/2012   Procedure: CARDIOVERSION;  Surgeon: Pixie Casino, MD;  Location: [facility] ENDOSCOPY;  Service: Cardiovascular;  Laterality: N/A;  . CARDIOVERSION N/A 06/22/2013   Procedure: CARDIOVERSION;  Surgeon: [name], MD;  Location: [place];  Service: Cardiovascular;  Laterality: N/A;  . CATARACT EXTRACTION Bilateral 2018   with lens implant  . CHOLECYSTECTOMY    . COLONOSCOPY    . ESOPHAGOGASTRODUODENOSCOPY    . IR IMAGING GUIDED PORT INSERTION  05/26/2018  . LYMPH NODE BIOPSY Left 04/27/2018   Procedure: EXCISIONAL BIOPSY OF LEFT CERVICAL LYMPH NODE;   Surgeon: Melida Quitter, MD;  Location: [place];  Service: ENT;  Laterality: Left;  . LYMPH NODE BIOPSY Left 11/23/2018   Procedure: LEFT CERVICAL LYMPH NODE OPEN BIOPSY;  Surgeon: Melida Quitter, MD;  Location: [place];  Service: ENT;  Laterality: Left;  . MENISCUS REPAIR Right 2009  . MITRAL VALVE REPAIR  2002   [facility]  . NM MYOVIEW LTD  07/22/2006   no ischemia  . RADICAL NECK DISSECTION Left 01/15/2019   Procedure: LEFT NECK DISSECTION;  Surgeon: Melida Quitter, MD;  Location: [place];  Service: ENT;  Laterality: Left;  . RIGHT HEART CATH  06/19/2004   normal right heart dynamics. EF 50%  . TEE WITHOUT CARDIOVERSION  02/12/2012   Procedure: TRANSESOPHAGEAL ECHOCARDIOGRAM (TEE);  Surgeon: Pixie Casino, MD;  Location: [facility] ENDOSCOPY;  Service: Cardiovascular;  Laterality: N/A;  . TEE WITHOUT CARDIOVERSION N/A 06/22/2013   Procedure: TRANSESOPHAGEAL ECHOCARDIOGRAM (TEE);  Surgeon: [name], MD;  Location: [place];  Service: Cardiovascular;  Laterality: N/A;  . THYROIDECTOMY N/A 01/15/2019   Procedure: TOTAL THYROIDECTOMY;  Surgeon: Melida Quitter, MD;  Location: [place];  Service: ENT;  Laterality: N/A;    Current Outpatient Medications  Medication Sig Dispense Refill  . acetaminophen (TYLENOL) 500 MG tablet Take 1,000 mg by mouth daily as needed for moderate pain.    Marland Kitchen alfuzosin (UROXATRAL) 10 MG 24 hr tablet Take 10 mg by mouth at bedtime.     Marland Kitchen buPROPion (WELLBUTRIN XL) 150 MG 24 hr tablet TAKE 1 TABLET DAILY (Patient taking  differently: Take 150 mg by mouth daily. ) 90 tablet 3  . Cholecalciferol (VITAMIN D) 2000 units tablet Take 2,000 Units by mouth at bedtime.     Marland Kitchen ezetimibe (ZETIA) 10 MG tablet Take 10 mg by mouth at bedtime.    . finasteride (PROSCAR) 5 MG tablet Take 5 mg by mouth at bedtime.     Marland Kitchen levothyroxine (SYNTHROID) 175 MCG tablet Take 1 tablet (175 mcg total) by mouth daily.    Marland Kitchen lidocaine-prilocaine (EMLA) cream Apply to affected area once 30 g 3  . metoprolol  succinate (TOPROL-XL) 25 MG 24 hr tablet Take 12.5 mg by mouth daily.     Vladimir Faster Glycol-Propyl Glycol (SYSTANE HYDRATION PF OP) Place 1 drop into both eyes 3 (three) times daily as needed (dry eyes).      No current facility-administered medications for this visit.    Allergies as of 11/17/2019 - Review Complete 11/17/2019  Allergen Reaction Noted  . Aspirin  04/24/2018  . Levaquin [levofloxacin in d5w]  07/22/2017  . Nickel Itching 07/22/2017  . Other  11/19/2018  . Statins Other (See Comments) 03/30/2013  . Allopurinol Rash 06/17/2018  . Ampicillin Rash 11/16/2012    Vitals: BP 130/84   Pulse 69   Ht 6\' 1"  (1.854 m)   Wt 218 lb (98.9 kg)   BMI 28.76 kg/m  Last Weight:  Wt Readings from Last 1 Encounters:  11/17/19 218 lb (98.9 kg)   KCL:EXNT mass index is 28.76 kg/m.     Last Height:   Ht Readings from Last 1 Encounters:  11/17/19 6\' 1"  (1.854 m)    Physical exam:  General: The patient is awake, alert and appears not in acute distress. The patient is well groomed. Head: Normocephalic, atraumatic. Neck is supple. Mallampati 4  neck circumference:17. 5 . Nasal airflow patent ,   Cardiovascular:  Regular rate and rhythm , without  murmurs or carotid bruit, and without distended neck veins. Status post mitral valve repair.  Respiratory: Lungs are clear to auscultation. Skin:  Without evidence of edema, or rash Trunk: BMI is 29. The patient's posture is erect   Neurologic exam : The patient is awake and alert, oriented to place and time.   Attention span & concentration ability appears normal.  Speech is fluent,  without   dysarthria, dysphonia or aphasia.  Mood and affect are appropriate.  Cranial nerves: No change in taste or smell. Pupils are equal and reactive to light.Extraocular movements  in vertical and horizontal planes intact and without nystagmus. Visual fields by finger perimetry are intact.Hearing to finger rub intact.   Facial sensation intact to  fine touch.  Facial motor strength is symmetric and tongue and uvula move midline. Shoulder shrug was symmetrical.   Motor exam:  Normal tone, muscle bulk and symmetric strength in all extremities. Sensory:  Fine touch, pinprick and vibration were tested in all extremities. Proprioception tested in the upper extremities was normal. Coordination: Rapid alternating movements in the fingers/hands was normal. Finger-to-nose maneuver  normal without evidence of ataxia, dysmetria or tremor. Gait and station: Patient walks without assistive device and is able unassisted to climb up to the exam table. Strength within normal limits. Stance is stable and normal. Tandem gait is unfragmented. Turns with 3  Steps.  Deep tendon reflexes: in the  upper and lower extremities are symmetric and intact. Babinski maneuver response is  downgoing.   Assessment:  After physical and neurologic examination, review of laboratory studies,  Personal review  of imaging studies, reports of other /same  Imaging studies, results of polysomnography and / or neurophysiology testing and pre-existing records as far as provided in visit., my assessment is   1)confirmed by NCV and EMG is his neuropathy with pain, Pin and needles, cramping and burning. No associated skin dystrophy, cyanosis, all pulses palpable.this affects his sleep.  mother had parkinson's disease. No family history of NP , no genetic testing.  Had Agent orange exposure.  This can be a [facility] claim .   2) Mr. Due was diagnosed with mild uncomplicated obstructive sleep apnea in the past, did not feel the benefit from CPAP therapy at the time, could not find a fitting mask.  He is still snoring and he has nocturia so it would be beneficial to see if he could not find a fitting nasal pillow or nasal cradle mask. He will do better avoiding supine sleep - tennisball method explain.    3) memory concern, delayed word finding.  Montreal Cognitive Assessment  04/30/2018   Visuospatial/ Executive (0/5) 5  Naming (0/3) 3  Attention: Read list of digits (0/2) 2  Attention: Read list of letters (0/1) 1  Attention: Serial 7 subtraction starting at 100 (0/3) 3  Language: Repeat phrase (0/2) 2  Language : Fluency (0/1) 1  Abstraction (0/2) 2  Delayed Recall (0/5) 0  Orientation (0/6) 6  Total 25    The patient was advised of the nature of the diagnosed disorder , the treatment options and the  risks for general health and wellness arising from not treating the condition.   I spent more than 25 minutes of face to face time with the patient.  Greater than 50% of time was spent in counseling and coordination of care. We have discussed the diagnosis and differential and I answered the patient's questions.    Plan:  Treatment plan and additional workup :  Try Wellbutrin in AM for neuropathy , anxiety, does not increase fall risk.  I refill today.  Offered Klonopin again.e filled it, hasn't used it.   He has not taken TRAZODONE. Adheres to the [employer],  Can try Requip for nocturnal pain, he doesn't have classical RLS, more stiffness. Its advancing- ascending.   MOCA next time again.  RV prn, 6 month  [place] and [place], MD 08/20/4130, 44:01 AM  Certified in Neurology by ABPN Certified in [place] by [facility] [address] [place],  [address]

## 2019-11-17 NOTE — Patient Instructions (Signed)
Thyroid Cancer The thyroid is a gland in the front of the neck. It makes hormones that help control many body functions, including how the body uses energy (metabolism). Thyroid cancer is an abnormal growth of cancerous (malignant) cells in the thyroid gland. There are four main types of thyroid cancer:  Papillary cancer. This is the least harmful type of thyroid cancer. It typically affects women of childbearing age.  Follicular cancer. This type of cancer is the most likely to come back after treatment (recur) and spread to other parts of the body (metastasize).  Medullary cancer. This can be passed from parent to child (inherited). A person who inherits the gene for this cancer is at high risk for developing the cancer.  Anaplastic cancer. This type may spread quickly to the windpipe (trachea) and cause breathing problems. This type of cancer is most common among people age 78 and older. What are the causes? The exact cause of thyroid cancer is not known. What increases the risk? Risk factors for thyroid cancer include:  Exposure to radiation of the head, neck, or chest in the past, especially during infancy or childhood (such as from radiation therapy for cancer).  Having a thyroid that is larger than normal (enlarged). This may also be called a thyroid goiter.  A family history of thyroid disease.  Being male. What are the signs or symptoms? Symptoms of thyroid cancer may include:  Enlarged thyroid gland. This may look like a large lump or swelling in the lower, front area of the neck.  Hoarseness or a change in how your voice sounds.  A cough.  Coughing up blood.  Difficulty swallowing.  Shortness of breath. How is this diagnosed? This condition may be diagnosed based on:  Your symptoms and medical history.  Imaging tests of the neck area, such as: ? Ultrasound. ? CT scan. ? MRI. ? PET scan.  Blood tests.  Removal and testing of a thyroid tissue sample  (biopsy). This will help determine the type of thyroid cancer. Your cancer will be assessed (staged) to determine how severe it is and how much it has spread. How is this treated? Most thyroid cancers are treated with surgery to remove most or all of the thyroid gland (thyroidectomy). In some cases, the lymph nodes in the neck that are close to the thyroid may also be removed during surgery. Lymph nodes are part of the body's disease-fighting (immune) system, and they are usually the first place that cancer spreads to. Treatment may also involve:  Radioactive iodine treatment. This is a procedure that involves swallowing a substance (radioactive iodine, or radioiodine) that gets absorbed by the thyroid and destroys cancerous tissue. This may be given to: ? Destroy remaining tissue that could not be removed surgically. ? Treat thyroid cancer that has recurred or metastasized.  Medicines that kill cancer cells in the body (chemotherapy).  High-energy rays that kill cancer cells in the body (radiation therapy). You may have this if your cancer has spread to your bones.  A procedure to kill cancer cells in the thyroid by injecting them with alcohol (alcohol ablation).  Medicines that help your body's immune system fight the cancer cells (immunotherapy).  Thyroid hormone therapy to: ? Replace the hormone in the body that is normally made by the thyroid. ? Reduce the activity of (suppress) a hormone that activates the thyroid (thyroid-stimulating hormone, TSH). Follow these instructions at home:  Take over-the-counter and prescription medicines only as told by your health care provider.  Consider joining a support group for people who have thyroid cancer.  Eat a healthy diet that includes fruits and vegetables, whole grains, lean protein, and low-fat or nonfat dairy products.  Work with your health care provider to manage any side effects of treatment.  Keep all follow-up visits as told by  your health care provider. This is important. Contact a health care provider if:  You have nausea or vomiting.  You have diarrhea.  You have a rash.  You have a fever.  You have problems with urinating, such as: ? A burning sensation while urinating. ? Needing to urinate more often than usual. ? Pain or difficulty urinating. ? Blood in your urine.  You develop a new cough.  You have symptoms of too much thyroid hormone (hyperthyroidism), such as: ? Nervousness or anxiety. ? Weight loss without trying. ? Sweating. ? Difficulty sleeping. ? Hair loss. ? Heart palpitations. ? Frequent bowel movements.  You have symptoms of too little thyroid hormone (hypothyroidism), such as: ? Fatigue. ? Puffiness in your face, hands, or feet. ? Weight gain without trying. ? Feeling cold. ? Constipation. Get help right away if:  You have chest pain.  You have shortness of breath.  You suddenly feel too weak or dizzy to stand or walk. Summary  Thyroid cancer is an abnormal growth of cancerous (malignant) cells in the thyroid gland.  The exact cause of thyroid cancer is unknown. A number of factors increase the risk, including past exposure to radiation.  Diagnosis is based on imaging studies and a biopsy.  Most thyroid cancers are treated with surgery to remove most or all of the thyroid gland (thyroidectomy), followed by other treatments such as radiation therapy, hormone therapy, or radioactive iodine treatment. This information is not intended to replace advice given to you by your health care provider. Make sure you discuss any questions you have with your health care provider. Document Revised: 02/21/2017 Document Reviewed: 11/13/2016 Elsevier Patient Education  2020 [employer].

## 2019-11-18 ENCOUNTER — Telehealth: Payer: Self-pay | Admitting: Neurology

## 2019-11-18 MED ORDER — BUPROPION HCL ER (XL) 150 MG PO TB24
150.0000 mg | ORAL_TABLET | Freq: Every day | ORAL | 3 refills | Status: DC
Start: 2019-11-18 — End: 2020-11-15

## 2019-11-18 NOTE — Addendum Note (Signed)
Addended by: Darleen Crocker on: 11/18/2019 10:59 AM   Modules accepted: Orders

## 2019-11-18 NOTE — Telephone Encounter (Signed)
[facility] with 3 refills.

## 2019-11-18 NOTE — Telephone Encounter (Signed)
Pt's wife Benjimen Kelley called would like prescription for buPROPion (WELLBUTRIN XL) 150 MG 24 hr tablet sent to [place] to be able to get a 90 day supply.

## 2019-11-19 ENCOUNTER — Encounter (INDEPENDENT_AMBULATORY_CARE_PROVIDER_SITE_OTHER): Payer: Medicare Other | Admitting: Ophthalmology

## 2019-11-19 ENCOUNTER — Other Ambulatory Visit: Payer: Self-pay

## 2019-11-19 DIAGNOSIS — H33303 Unspecified retinal break, bilateral: Secondary | ICD-10-CM | POA: Diagnosis not present

## 2019-11-19 DIAGNOSIS — H43813 Vitreous degeneration, bilateral: Secondary | ICD-10-CM | POA: Diagnosis not present

## 2019-11-22 ENCOUNTER — Encounter (HOSPITAL_COMMUNITY): Payer: Self-pay

## 2019-11-22 ENCOUNTER — Inpatient Hospital Stay: Payer: Medicare Other | Attending: Hematology and Oncology

## 2019-11-22 ENCOUNTER — Ambulatory Visit (HOSPITAL_COMMUNITY)
Admission: RE | Admit: 2019-11-22 | Discharge: 2019-11-22 | Disposition: A | Discharge status: Home / Self Care | Payer: Medicare Other | Source: Ambulatory Visit | Attending: Hematology and Oncology | Admitting: Hematology and Oncology

## 2019-11-22 ENCOUNTER — Inpatient Hospital Stay: Payer: Medicare Other

## 2019-11-22 ENCOUNTER — Other Ambulatory Visit: Payer: Self-pay

## 2019-11-22 DIAGNOSIS — Z79899 Other long term (current) drug therapy: Secondary | ICD-10-CM | POA: Insufficient documentation

## 2019-11-22 DIAGNOSIS — C8318 Mantle cell lymphoma, lymph nodes of multiple sites: Secondary | ICD-10-CM

## 2019-11-22 DIAGNOSIS — N400 Enlarged prostate without lower urinary tract symptoms: Secondary | ICD-10-CM | POA: Insufficient documentation

## 2019-11-22 DIAGNOSIS — Z452 Encounter for adjustment and management of vascular access device: Secondary | ICD-10-CM | POA: Insufficient documentation

## 2019-11-22 DIAGNOSIS — K5730 Diverticulosis of large intestine without perforation or abscess without bleeding: Secondary | ICD-10-CM | POA: Diagnosis not present

## 2019-11-22 DIAGNOSIS — C8310 Mantle cell lymphoma, unspecified site: Secondary | ICD-10-CM | POA: Diagnosis not present

## 2019-11-22 DIAGNOSIS — I2510 Atherosclerotic heart disease of native coronary artery without angina pectoris: Secondary | ICD-10-CM | POA: Diagnosis not present

## 2019-11-22 DIAGNOSIS — Z85850 Personal history of malignant neoplasm of thyroid: Secondary | ICD-10-CM | POA: Diagnosis not present

## 2019-11-22 LAB — CMP (CANCER CENTER ONLY)
ALT: 22 U/L (ref 0–44)
AST: 19 U/L (ref 15–41)
Albumin: 4.1 g/dL (ref 3.5–5.0)
Alkaline Phosphatase: 71 U/L (ref 38–126)
Anion gap: 7 (ref 5–15)
BUN: 21 mg/dL (ref 8–23)
CO2: 28 mmol/L (ref 22–32)
Calcium: 9.9 mg/dL (ref 8.9–10.3)
Chloride: 103 mmol/L (ref 98–111)
Creatinine: 1.22 mg/dL (ref 0.61–1.24)
GFR, Est AFR Am: >60 mL/min (ref >60)
GFR, Estimated: 56 mL/min — ABNORMAL LOW (ref >60)
Glucose, Bld: 87 mg/dL (ref 70–99)
Potassium: 4.5 mmol/L (ref 3.5–5.1)
Sodium: 138 mmol/L (ref 135–145)
Total Bilirubin: 1 mg/dL (ref 0.3–1.2)
Total Protein: 6.6 g/dL (ref 6.5–8.1)

## 2019-11-22 LAB — CBC WITH DIFFERENTIAL (CANCER CENTER ONLY)
Abs Immature Granulocytes: 0.03 10*3/uL (ref 0.00–0.07)
Basophils Absolute: 0.1 10*3/uL (ref 0.0–0.1)
Basophils Relative: 1 %
Eosinophils Absolute: 0.2 10*3/uL (ref 0.0–0.5)
Eosinophils Relative: 3 %
HCT: 41.2 % (ref 39.0–52.0)
Hemoglobin: 13.7 g/dL (ref 13.0–17.0)
Immature Granulocytes: 0 %
Lymphocytes Relative: 11 %
Lymphs Abs: 0.7 10*3/uL (ref 0.7–4.0)
MCH: 30.9 pg (ref 26.0–34.0)
MCHC: 33.3 g/dL (ref 30.0–36.0)
MCV: 92.8 fL (ref 80.0–100.0)
Monocytes Absolute: 0.8 10*3/uL (ref 0.1–1.0)
Monocytes Relative: 12 %
Neutro Abs: 4.9 10*3/uL (ref 1.7–7.7)
Neutrophils Relative %: 73 %
Platelet Count: 159 10*3/uL (ref 150–400)
RBC: 4.44 MIL/uL (ref 4.22–5.81)
RDW: 12.6 % (ref 11.5–15.5)
WBC Count: 6.7 10*3/uL (ref 4.0–10.5)
nRBC: 0 % (ref 0.0–0.2)

## 2019-11-22 MED ORDER — SODIUM CHLORIDE 0.9% FLUSH
10.0000 mL | Freq: Once | INTRAVENOUS | Status: AC
  Administered 2019-11-22: 10 mL
  Filled 2019-11-22: qty 10

## 2019-11-22 MED ORDER — IOHEXOL 300 MG/ML  SOLN
100.0000 mL | Freq: Once | INTRAMUSCULAR | Status: AC | PRN
  Administered 2019-11-22: 100 mL via INTRAVENOUS

## 2019-11-22 MED ORDER — SODIUM CHLORIDE (PF) 0.9 % IJ SOLN
INTRAMUSCULAR | Status: AC
  Filled 2019-11-22: qty 50

## 2019-11-22 MED ORDER — HEPARIN SOD (PORK) LOCK FLUSH 100 UNIT/ML IV SOLN
INTRAVENOUS | Status: AC
  Filled 2019-11-22: qty 5

## 2019-11-22 NOTE — Patient Instructions (Signed)

## 2019-11-23 ENCOUNTER — Encounter: Payer: Self-pay | Admitting: Hematology and Oncology

## 2019-11-23 ENCOUNTER — Inpatient Hospital Stay (HOSPITAL_BASED_OUTPATIENT_CLINIC_OR_DEPARTMENT_OTHER): Payer: Medicare Other | Admitting: Hematology and Oncology

## 2019-11-23 DIAGNOSIS — I7121 Aneurysm of the ascending aorta, without rupture: Secondary | ICD-10-CM | POA: Insufficient documentation

## 2019-11-23 DIAGNOSIS — Z452 Encounter for adjustment and management of vascular access device: Secondary | ICD-10-CM | POA: Diagnosis not present

## 2019-11-23 DIAGNOSIS — C8318 Mantle cell lymphoma, lymph nodes of multiple sites: Secondary | ICD-10-CM

## 2019-11-23 DIAGNOSIS — N400 Enlarged prostate without lower urinary tract symptoms: Secondary | ICD-10-CM | POA: Diagnosis not present

## 2019-11-23 DIAGNOSIS — Z85850 Personal history of malignant neoplasm of thyroid: Secondary | ICD-10-CM | POA: Diagnosis not present

## 2019-11-23 DIAGNOSIS — I712 Thoracic aortic aneurysm, without rupture: Secondary | ICD-10-CM

## 2019-11-23 DIAGNOSIS — C73 Malignant neoplasm of thyroid gland: Secondary | ICD-10-CM | POA: Diagnosis not present

## 2019-11-23 DIAGNOSIS — I890 Lymphedema, not elsewhere classified: Secondary | ICD-10-CM | POA: Diagnosis not present

## 2019-11-23 DIAGNOSIS — I2510 Atherosclerotic heart disease of native coronary artery without angina pectoris: Secondary | ICD-10-CM | POA: Diagnosis not present

## 2019-11-23 DIAGNOSIS — Z79899 Other long term (current) drug therapy: Secondary | ICD-10-CM | POA: Diagnosis not present

## 2019-11-23 NOTE — Assessment & Plan Note (Signed)
He will continue medical management We will repeat CT imaging in a year

## 2019-11-23 NOTE — Assessment & Plan Note (Signed)
He has significant lymphedema since discontinuation of physical therapy I will refer him back again for further lymphedema treatment

## 2019-11-23 NOTE — Assessment & Plan Note (Signed)
He is doing well We discussed the importance of follow-up with endocrinologist

## 2019-11-23 NOTE — Assessment & Plan Note (Signed)
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

## 2019-11-23 NOTE — Progress Notes (Signed)
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 with the patient He has no signs of cancer recurrence We discussed risk and benefits of keeping his port Assuming that his port will work after [employer] and of the week, we will maintain port for up to 2 years I plan to see him in 6 months for further follow-up The patient is educated to watch for signs and symptoms of cancer recurrence I recommend imaging study once a year or sooner if he have signs or symptoms worrisome for cancer recurrence  Thyroid cancer [facility]) He is doing well We discussed the importance of follow-up with endocrinologist  Acquired lymphedema He has significant lymphedema since discontinuation of physical therapy I will refer him back again for further lymphedema treatment  Ascending aortic aneurysm [facility]) He will continue medical management We will repeat CT imaging in a year   Orders Placed This Encounter  Procedures  . Ambulatory referral to Physical Therapy    Referral Priority:   Routine    Referral Type:   Physical Medicine    Referral Reason:   Specialty Services Required    Requested Specialty:   Physical Therapy    Number of Visits Requested:   1    All questions were answered. The patient knows to call the clinic with any problems, questions or concerns. The total time spent in the appointment was 30 minutes encounter with patients including review of chart and various tests results, discussions about plan of care and coordination of care plan   Heath Lark, MD 11/23/2019 2:52 PM  INTERVAL HISTORY: Please see below for problem oriented charting. He returns with his wife for further follow-up He is  doing well No recent infection, fever or chills No new lymphadenopathy He complained of recurrent lymphedema since discontinuation of physical therapy and would like to continue therapy if possible  SUMMARY OF ONCOLOGIC HISTORY: Oncology History Overview Note  MIPI score 7.5 high risk   Mantle cell lymphoma ([place])  06/13/2017 Imaging   Ct imaging at [facility] 1. 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 2. 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 3. Slight reduction in size of right retroperitoneal evolving hematoma.   03/30/2018 Imaging   Ct neck 1. Extensive adenopathy on the left. The largest node is a level 1/submandibular node measuring 38 x 24 x 22 mm. Largest level 2 node measures 3.2 x 2 x 2 cm. Numerous other smaller but round lymph nodes throughout the left neck in the level 2 through level 4 region. The largest level 5 node measures 3.4 x 3.4 x 2.3 cm with a transverse diameter of 2.3 cm. This contains some internal calcifications. There are numerous other pathologic nodes in the left supraclavicular to axillary region. This pattern of disease could be due to extensive left neck metastatic disease from unknown primary, lymphoma, or other systemic malignancy. 2. No evidence of mucosal or submucosal lesion. 3. Diameter of the ascending aorta is 4.5 cm. Recommend annual imaging followup by CTA or MRA. Aortic Atherosclerosis (ICD10-I70.0).    04/10/2018 Pathology Results   Left zone 1 neck mass,  Fine Needle Aspiration I (smears and cell block): Atypical lymphoid proliferation. See comment.  Specimen Adequacy: Satisfactory for evaluation.  COMMENT:The aspirate demonstrates abundant small to medium sized lymphocytes with scattered epithelioid cells in the background which may be histiocytes. The smears are  cellular, [name], the findings indicate an atypical lymphoid proliferation but are too limited for definitive diagnosis. Excisional biopsy with flow cytometry is recommended.   04/10/2018 Procedure   He was ENT who performed FNA of neck LN   04/27/2018 Pathology Results   Lymph node for lymphoma, [name], [name], [name], [name], [name], [name], [name], the features are consistent with a mantle cell lymphoma   05/13/2018 PET scan   1. Hypermetabolic left cervical lymphadenopathy, left axillary lymphadenopathy, mediastinal / right hilar lymphadenopathy, right external iliac lymphadenopathy, and subcentimeter left external iliac and left inguinal lymph nodes, [name], [name], [name], without focal lesions, that may represent lymphomatous involvement of the spleen.   05/21/2018 Cancer Staging   Staging form: Hodgkin and Non-Hodgkin Lymphoma, AJCC 8th Edition - Clinical: Stage  III - Signed by Heath Lark, MD on 05/21/2018   05/26/2018 Procedure   Ultrasound and fluoroscopically guided right internal jugular single lumen power port catheter insertion. Tip in the SVC/RA junction. Catheter ready for use.    06/01/2018 -  Chemotherapy   The patient had Bendamustine and Rituximab for chemotherapy treatment   08/24/2018 PET scan   1. Partial response to therapy with complete resolution of size and metabolic activity of the dominant LEFT submandibular node (level 1) and LEFT axillary nodes. 2. Persistent and increased metabolic activity of left level 3 lymph nodes ( Deauville 4). Nodes are partially calcified and similar size. 3. No new metastatic disease. 4. Normal spleen and bone marrow.   11/09/2018 PET scan   IMPRESSION: 1. Mild response to therapy of hypermetabolic left cervical nodes. (Deauville) 4. 2. No new or progressive disease. 3. Coronary artery atherosclerosis. Aortic Atherosclerosis (ICD10-I70.0). 4. Trace cul-de-sac fluid, similar.   12/18/2018 Imaging   1. 4 mm solid nodule in the right lower lobe, which is technically indeterminate. Recommend attention on follow-up per clinical protocol. 2. Small calcified nodule adjacent to the left lobe of the thyroid, which may represent thyroid nodule or calcified lymph node. Recommend correlation with thyroid ultrasound. 3. The ascending aorta appears mildly dilated, measuring up to 4.2cm. 4. Decreased left axillary lymphadenopathy.   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   Stranding with postsurgical changes along the right anterior neck anterior to the thyroid cartilage, favored to be related to recent surgery. Attention  on follow-up is suggested.   No suspicious lymphadenopathy in the neck, chest, abdomen, or pelvis. Deauville criteria 1   11/22/2019 Imaging   1. No evidence of recurrent lymphoma in the chest,  abdomen or pelvis. Normal size spleen. 2. 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 Recommend annual imaging followup by CTA or MRA. This recommendation follows 2010 [facility]/AHA/AATS/ACR/ASA/SCA/SCAI/SIR/STS/SVM Guidelines for the Diagnosis and Management of Patients with Thoracic Aortic Disease. Circulation. 2010; 121: Q947-M546. Aortic aneurysm NOS (ICD10-I71.9). Moderate diffuse colonic diverticulosis. Mild prostatomegaly. Aortic Atherosclerosis (ICD10-I70.0).   Thyroid cancer ([place])  11/23/2018 Pathology Results   Lymph node for lymphoma, Left zone 3 - PAPILLARY THYROID CARCINOMA. - SEE MICROSCOPIC DESCRIPTION. Microscopic Comment The specimen consists mostly of encapsulated papillary thyroid carcinoma. There are portions of lymphoid tissue attached to the periphery of the specimen and in the adjacent adipose tissue which suggests that this may represent papillary carcinoma metastatic to a lymph node.   11/23/2018 Surgery   PREOPERATIVE DIAGNOSIS:  Mantle cell lymphoma and cervical lymphadenopathy.   POSTOPERATIVE DIAGNOSIS:  Mantle cell lymphoma and cervical lymphadenopathy.   PROCEDURE:  Excisional biopsy of left cervical lymph nodes.     01/15/2019 Pathology Results   A. SOFT TISSUE, NECK, ADJACENT TO LEFT RECURRENT NERVE, BIOPSY: - Papillary thyroid carcinoma. B. 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 C. ADDITIONAL LEFT LEVEL 4 TISSUE, EXCISION: - There is no evidence of carcinoma in 2 of 2 lymph nodes (0/2). D. TOTAL THYROIDECTOMY: - Papillary thyroid carcinoma, 0.8 cm. - Carcinoma is broadly present at an inked tissue edge. - See oncology table below. E. LEFT LEVEL 6 TISSUE, EXCISION: - Benign fibroadipose tissue. - There is no evidence of malignancy. F. LEFT RECURRENT NERVE AND SURROUNDING TISSUE, EXCISION: - Metastatic papillary thyroid carcinoma in 3 of 3 lymph nodes  (3/3).  THYROID GLAND: Procedure: Thyroidectomy Tumor Focality: Unifocal Tumor Site: Left lobe Tumor Size: 0.8 cm Histologic Type: 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Margins: Carcinoma is broadly present at a black inked tissue edge. 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   PROCEDURE:  Total thyroidectomy and left modified radical neck dissection.   SURGEON:  Melida Quitter, MD     FINDINGS:  There was a firm nodule in zone IIB on the left side and also some firm tissue and adherence to the jugular vein in zone III region requiring removal of a portion of the vein.  There was a firm nodule in the left thyroid lobe that extended posterior to the gland, adhering to the trachea, and encompassing the recurrent laryngeal nerve.  Tissue was sent for frozen section from adjacent to the nerve, demonstrating papillary carcinoma.  The nerve stimulated distal to the mass but not proximal to the mass; thus, the nerve was removed with the mass.  The left-sided parathyroid glands were visualized, as was the right superior parathyroid gland.  The right recurrent nerve was kept safe and stimulated well at the  end of the case.     REVIEW OF SYSTEMS:   Constitutional: Denies fevers, chills or abnormal weight loss Eyes: Denies blurriness of vision Ears, nose, mouth, throat, and face: Denies mucositis or sore  throat Respiratory: Denies cough, dyspnea or wheezes Cardiovascular: Denies palpitation, chest discomfort or lower extremity swelling Gastrointestinal:  Denies nausea, heartburn or change in bowel habits Skin: Denies abnormal skin rashes Lymphatics: Denies new lymphadenopathy or easy bruising Neurological:Denies numbness, tingling or new weaknesses Behavioral/Psych: Mood is stable, no new changes  All other systems were reviewed with the patient and are negative.  I have reviewed the past medical history, past surgical history, social history and family history with the patient and they are unchanged from previous note.  ALLERGIES:  is allergic to aspirin, levaquin [levofloxacin in d5w], nickel, other, statins, allopurinol, and ampicillin.  MEDICATIONS:  Current Outpatient Medications  Medication Sig Dispense Refill  . acetaminophen (TYLENOL) 500 MG tablet Take 1,000 mg by mouth daily as needed for moderate pain.    Marland Kitchen alfuzosin (UROXATRAL) 10 MG 24 hr tablet Take 10 mg by mouth at bedtime.     Marland Kitchen buPROPion (WELLBUTRIN XL) 150 MG 24 hr tablet Take 1 tablet (150 mg total) by mouth daily. 90 tablet 3  . Cholecalciferol (VITAMIN D) 2000 units tablet Take 2,000 Units by mouth at bedtime.     Marland Kitchen ezetimibe (ZETIA) 10 MG tablet Take 10 mg by mouth at bedtime.    . finasteride (PROSCAR) 5 MG tablet Take 5 mg by mouth at bedtime.     Marland Kitchen levothyroxine (SYNTHROID) 175 MCG tablet Take 1 tablet (175 mcg total) by mouth daily.    Marland Kitchen lidocaine-prilocaine (EMLA) cream Apply to affected area once 30 g 3  . metoprolol succinate (TOPROL-XL) 25 MG 24 hr tablet Take 12.5 mg by mouth daily.     Vladimir Faster Glycol-Propyl Glycol (SYSTANE HYDRATION PF OP) Place 1 drop into both eyes 3 (three) times daily as needed (dry eyes).      No current facility-administered medications for this visit.    PHYSICAL EXAMINATION: ECOG PERFORMANCE STATUS: 1 - Symptomatic but completely ambulatory  Vitals:   11/23/19 1057  BP:  129/77  Pulse: 70  Resp: 18  Temp: (!) 96.2 F (35.7 C)  SpO2: 97%   Filed Weights   11/23/19 1057  Weight: 220 lb (99.8 kg)    GENERAL:alert, no distress and comfortable NECK: Noted lymphedema on his neck NEURO: alert & oriented x 3 with fluent speech, no focal motor/sensory deficits  LABORATORY DATA:  I have reviewed the data as listed    Component Value Date/Time   NA 138 11/22/2019 1023   K 4.5 11/22/2019 1023   CL 103 11/22/2019 1023   CO2 28 11/22/2019 1023   GLUCOSE 87 11/22/2019 1023   BUN 21 11/22/2019 1023   CREATININE 1.22 11/22/2019 1023   CALCIUM 9.9 11/22/2019 1023   PROT 6.6 11/22/2019 1023   PROT 6.6 07/23/2017 1504   ALBUMIN 4.1 11/22/2019 1023   AST 19 11/22/2019 1023   ALT 22 11/22/2019 1023   ALKPHOS 71 11/22/2019 1023   BILITOT 1.0 11/22/2019 1023   GFRNONAA 56 (L) 11/22/2019 1023   GFRAA >60 11/22/2019 1023    No results found for: SPEP, UPEP  Lab Results  Component Value Date   WBC 6.7 11/22/2019   NEUTROABS 4.9 11/22/2019   HGB 13.7 11/22/2019   HCT 41.2 11/22/2019   MCV 92.8 11/22/2019   PLT 159 11/22/2019  Chemistry      Component Value Date/Time   NA 138 11/22/2019 1023   K 4.5 11/22/2019 1023   CL 103 11/22/2019 1023   CO2 28 11/22/2019 1023   BUN 21 11/22/2019 1023   CREATININE 1.22 11/22/2019 1023      Component Value Date/Time   CALCIUM 9.9 11/22/2019 1023   ALKPHOS 71 11/22/2019 1023   AST 19 11/22/2019 1023   ALT 22 11/22/2019 1023   BILITOT 1.0 11/22/2019 1023       RADIOGRAPHIC STUDIES: I have reviewed multiple CT imaging with the patient I have personally reviewed the radiological images as listed and agreed with the findings in the report. CT CHEST W CONTRAST  Result Date: 11/22/2019 CLINICAL DATA:  Mantle cell lymphoma status post chemotherapy. 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 Restaging. EXAM: CT CHEST, ABDOMEN, AND PELVIS WITH CONTRAST TECHNIQUE: Multidetector CT  imaging of the chest, abdomen and pelvis was performed following the standard protocol during bolus administration of intravenous contrast. CONTRAST:  165mL OMNIPAQUE IOHEXOL 300 MG/ML  SOLN COMPARISON:  05/24/2019 PET-CT. FINDINGS: CT CHEST FINDINGS Cardiovascular: Borderline mild cardiomegaly. No significant pericardial effusion/thickening. Right internal jugular Port-A-Cath terminates in the lower third of the SVC. Atherosclerotic thoracic aorta with ectatic 4.4 cm ascending thoracic aorta. Top-normal caliber main pulmonary artery (3.0 cm diameter). No central pulmonary emboli. Mediastinum/Nodes: Total thyroidectomy. Unremarkable esophagus. No pathologically enlarged axillary, mediastinal or hilar lymph nodes. Lungs/Pleura: No pneumothorax. No pleural effusion. No acute consolidative airspace disease, lung masses or significant pulmonary nodules. Chronic parenchymal banding at the left greater than right lung bases is unchanged. Musculoskeletal: No aggressive appearing focal osseous lesions. Marked thoracic spondylosis. CT ABDOMEN PELVIS FINDINGS Hepatobiliary: Normal liver size. Subcentimeter hypervascular focus at the posterior right liver dome (series 2/image 53) is stable since 03/06/2013 CT angiogram and considered benign. Indistinct hypervascular subcapsular focus at the left liver dome (series 2/image 52), more discrete on today's scan although present on 03/06/2013 CT angiogram study, favoring transient benign vascular focus. No liver masses. Cholecystectomy. Bile ducts are within normal post cholecystectomy limits with CBD diameter 7 mm. Pancreas: Normal, with no mass or duct dilation. Spleen: Normal size. No mass. Adrenals/Urinary Tract: Normal adrenals. Simple 1.5 cm upper left renal cyst. Small parapelvic renal cysts in the left greater than right kidneys. No hydronephrosis. Normal bladder. Stomach/Bowel: Normal non-distended stomach. Chronic hypodense 3.0 x 1.4 cm collection posterior to the  transverse duodenum (series 2/image 80), substantially reduced from 8.2 x 5.4 cm on 03/06/2013 CT angiogram study and slightly decreased from 3.4 x 1.5 cm on 05/24/2019 PET-CT. Normal caliber small bowel with no small bowel wall thickening. Normal appendix. Oral contrast transits to the colon. Moderate diffuse colonic diverticulosis with no large bowel wall thickening or significant pericolonic fat stranding. Vascular/Lymphatic: Atherosclerotic nonaneurysmal abdominal aorta. Patent portal, splenic and renal veins. No pathologically enlarged lymph nodes in the abdomen or pelvis. Reproductive: Mild prostatomegaly. Other: No pneumoperitoneum, ascites or focal fluid collection. Musculoskeletal: No aggressive appearing focal osseous lesions. Moderate lumbar spondylosis. IMPRESSION: 1. No evidence of recurrent lymphoma in the chest, abdomen or pelvis. Normal size spleen. 2. 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 Recommend annual imaging followup by CTA or MRA. This recommendation follows 2010 ACCF/AHA/AATS/ACR/ASA/SCA/SCAI/SIR/STS/SVM Guidelines for the Diagnosis and Management of Patients with Thoracic Aortic Disease. Circulation. 2010; 121: E831-D176. Aortic aneurysm NOS (ICD10-I71.9). Moderate diffuse colonic diverticulosis. Mild prostatomegaly. Aortic Atherosclerosis (ICD10-I70.0). Electronically Signed   By: Ilona Sorrel M.D.   On: 11/22/2019  14:03   CT ABDOMEN PELVIS W CONTRAST  Result Date: 11/22/2019 CLINICAL DATA:  Mantle cell lymphoma status post chemotherapy. 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 Restaging. EXAM: CT CHEST, ABDOMEN, AND PELVIS WITH CONTRAST TECHNIQUE: Multidetector CT imaging of the chest, abdomen and pelvis was performed following the standard protocol during bolus administration of intravenous contrast. CONTRAST:  124mL OMNIPAQUE IOHEXOL 300 MG/ML  SOLN COMPARISON:  05/24/2019 PET-CT. FINDINGS: CT CHEST FINDINGS Cardiovascular:  Borderline mild cardiomegaly. No significant pericardial effusion/thickening. Right internal jugular Port-A-Cath terminates in the lower third of the SVC. Atherosclerotic thoracic aorta with ectatic 4.4 cm ascending thoracic aorta. Top-normal caliber main pulmonary artery (3.0 cm diameter). No central pulmonary emboli. Mediastinum/Nodes: Total thyroidectomy. Unremarkable esophagus. No pathologically enlarged axillary, mediastinal or hilar lymph nodes. Lungs/Pleura: No pneumothorax. No pleural effusion. No acute consolidative airspace disease, lung masses or significant pulmonary nodules. Chronic parenchymal banding at the left greater than right lung bases is unchanged. Musculoskeletal: No aggressive appearing focal osseous lesions. Marked thoracic spondylosis. CT ABDOMEN PELVIS FINDINGS Hepatobiliary: Normal liver size. Subcentimeter hypervascular focus at the posterior right liver dome (series 2/image 53) is stable since 03/06/2013 CT angiogram and considered benign. Indistinct hypervascular subcapsular focus at the left liver dome (series 2/image 52), more discrete on today's scan although present on 03/06/2013 CT angiogram study, favoring transient benign vascular focus. No liver masses. Cholecystectomy. Bile ducts are within normal post cholecystectomy limits with CBD diameter 7 mm. Pancreas: Normal, with no mass or duct dilation. Spleen: Normal size. No mass. Adrenals/Urinary Tract: Normal adrenals. Simple 1.5 cm upper left renal cyst. Small parapelvic renal cysts in the left greater than right kidneys. No hydronephrosis. Normal bladder. Stomach/Bowel: Normal non-distended stomach. Chronic hypodense 3.0 x 1.4 cm collection posterior to the transverse duodenum (series 2/image 80), substantially reduced from 8.2 x 5.4 cm on 03/06/2013 CT angiogram study and slightly decreased from 3.4 x 1.5 cm on 05/24/2019 PET-CT. Normal caliber small bowel with no small bowel wall thickening. Normal appendix. Oral contrast  transits to the colon. Moderate diffuse colonic diverticulosis with no large bowel wall thickening or significant pericolonic fat stranding. Vascular/Lymphatic: Atherosclerotic nonaneurysmal abdominal aorta. Patent portal, splenic and renal veins. No pathologically enlarged lymph nodes in the abdomen or pelvis. Reproductive: Mild prostatomegaly. Other: No pneumoperitoneum, ascites or focal fluid collection. Musculoskeletal: No aggressive appearing focal osseous lesions. Moderate lumbar spondylosis. IMPRESSION: 1. No evidence of recurrent lymphoma in the chest, abdomen or pelvis. Normal size spleen. 2. 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 Recommend annual imaging followup by CTA or MRA. This recommendation follows 2010 ACCF/AHA/AATS/ACR/ASA/SCA/SCAI/SIR/STS/SVM Guidelines for the Diagnosis and Management of Patients with Thoracic Aortic Disease. Circulation. 2010; 121: O707-E675. Aortic aneurysm NOS (ICD10-I71.9). Moderate diffuse colonic diverticulosis. Mild prostatomegaly. Aortic Atherosclerosis (ICD10-I70.0). Electronically Signed   By: Ilona Sorrel M.D.   On: 11/22/2019 14:03

## 2019-11-26 ENCOUNTER — Other Ambulatory Visit: Payer: Self-pay

## 2019-11-26 ENCOUNTER — Inpatient Hospital Stay: Payer: Medicare Other | Attending: Hematology and Oncology

## 2019-11-26 DIAGNOSIS — C8318 Mantle cell lymphoma, lymph nodes of multiple sites: Secondary | ICD-10-CM | POA: Diagnosis not present

## 2019-11-26 DIAGNOSIS — I722 Aneurysm of renal artery: Secondary | ICD-10-CM | POA: Diagnosis not present

## 2019-11-26 DIAGNOSIS — Z452 Encounter for adjustment and management of vascular access device: Secondary | ICD-10-CM | POA: Insufficient documentation

## 2019-11-26 DIAGNOSIS — I712 Thoracic aortic aneurysm, without rupture: Secondary | ICD-10-CM | POA: Diagnosis not present

## 2019-11-26 MED ORDER — HEPARIN SOD (PORK) LOCK FLUSH 100 UNIT/ML IV SOLN
500.0000 [IU] | Freq: Once | INTRAVENOUS | Status: AC
  Administered 2019-11-26: 500 [IU]
  Filled 2019-11-26: qty 5

## 2019-11-26 MED ORDER — SODIUM CHLORIDE 0.9% FLUSH
10.0000 mL | Freq: Once | INTRAVENOUS | Status: AC
  Administered 2019-11-26: 10 mL
  Filled 2019-11-26: qty 10

## 2019-12-06 DIAGNOSIS — C73 Malignant neoplasm of thyroid gland: Secondary | ICD-10-CM | POA: Diagnosis not present

## 2019-12-06 DIAGNOSIS — J3800 Paralysis of vocal cords and larynx, unspecified: Secondary | ICD-10-CM | POA: Diagnosis not present

## 2019-12-06 DIAGNOSIS — C8310 Mantle cell lymphoma, unspecified site: Secondary | ICD-10-CM | POA: Diagnosis not present

## 2019-12-06 DIAGNOSIS — E785 Hyperlipidemia, unspecified: Secondary | ICD-10-CM | POA: Diagnosis not present

## 2019-12-06 DIAGNOSIS — G609 Hereditary and idiopathic neuropathy, unspecified: Secondary | ICD-10-CM | POA: Diagnosis not present

## 2019-12-06 DIAGNOSIS — Z952 Presence of prosthetic heart valve: Secondary | ICD-10-CM | POA: Diagnosis not present

## 2019-12-06 DIAGNOSIS — I714 Abdominal aortic aneurysm, without rupture: Secondary | ICD-10-CM | POA: Diagnosis not present

## 2019-12-06 DIAGNOSIS — I2510 Atherosclerotic heart disease of native coronary artery without angina pectoris: Secondary | ICD-10-CM | POA: Diagnosis not present

## 2019-12-06 DIAGNOSIS — N1832 Chronic kidney disease, stage 3b: Secondary | ICD-10-CM | POA: Diagnosis not present

## 2019-12-06 DIAGNOSIS — I429 Cardiomyopathy, unspecified: Secondary | ICD-10-CM | POA: Diagnosis not present

## 2019-12-06 DIAGNOSIS — E890 Postprocedural hypothyroidism: Secondary | ICD-10-CM | POA: Diagnosis not present

## 2019-12-06 DIAGNOSIS — I700 Atherosclerosis of aorta: Secondary | ICD-10-CM | POA: Diagnosis not present

## 2019-12-13 DIAGNOSIS — I722 Aneurysm of renal artery: Secondary | ICD-10-CM | POA: Diagnosis not present

## 2019-12-13 DIAGNOSIS — I728 Aneurysm of other specified arteries: Secondary | ICD-10-CM | POA: Diagnosis not present

## 2019-12-23 ENCOUNTER — Encounter: Payer: Self-pay | Admitting: Rehabilitation

## 2019-12-23 ENCOUNTER — Ambulatory Visit: Payer: Medicare Other | Attending: Hematology and Oncology | Admitting: Rehabilitation

## 2019-12-23 ENCOUNTER — Other Ambulatory Visit: Payer: Self-pay

## 2019-12-23 DIAGNOSIS — E890 Postprocedural hypothyroidism: Secondary | ICD-10-CM | POA: Diagnosis not present

## 2019-12-23 DIAGNOSIS — M542 Cervicalgia: Secondary | ICD-10-CM | POA: Diagnosis not present

## 2019-12-23 DIAGNOSIS — R293 Abnormal posture: Secondary | ICD-10-CM | POA: Diagnosis not present

## 2019-12-23 DIAGNOSIS — Z483 Aftercare following surgery for neoplasm: Secondary | ICD-10-CM

## 2019-12-23 NOTE — Patient Instructions (Signed)
[facility]: [email or web address]

## 2019-12-23 NOTE — Therapy (Addendum)
[place], [place], [address] Phone: [phone]   Fax:  [phone]  Physical Therapy Evaluation  Patient Details  Name: Sean Stanley MRN: [id number] Date of Birth: August 27, 1940 Referring Provider (PT): Alvy Bimler   Encounter Date: 12/23/2019   PT End of Session - 12/23/19 2112    Visit Number 1    Number of Visits 13    Date for PT Re-Evaluation 02/03/20    PT Start Time 1517    PT Stop Time 1355    PT Time Calculation (min) 50 min    Activity Tolerance Patient tolerated treatment well    Behavior During Therapy [facility] for tasks assessed/performed           Past Medical History:  Diagnosis Date  . 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 Atrial flutter ([place])   . BPH (benign prostatic hyperplasia)   . Cancer ([place])    melanoma on lower right back and left chest - surgically removed and cleared  . Dysrhythmia   . 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 Headache    migraine- not current  . 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 History of blood transfusion   . Hypercholesteremia   . Hypercholesterolemia   . Lymphoma ([place]) 04/2018  . Mitral valve disease    annuloplasty 2002 [facility]  . Neuropathy   . Neuropathy of both feet    pt states due to exposure to [employer]  . OSA (obstructive sleep apnea)    does not use cpap, Dr. Maxwell Caul told him he had improved  . Pneumonia   . Thoracic ascending aortic aneurysm (H[facility])    4.5 cm 03/2018 CT  . Thyroid cancer [facility])     Past Surgical History:  Procedure  Laterality Date  . ABDOMINAL ANGIOGRAM  08/05/12  . aneurysm repair    . CARDIOVERSION  02/12/2012   Procedure: CARDIOVERSION;  Surgeon: Pixie Casino, MD;  Location: [facility] ENDOSCOPY;  Service: Cardiovascular;  Laterality: N/A;  . CARDIOVERSION N/A 06/22/2013   Procedure: CARDIOVERSION;  Surgeon: [name], MD;  Location: [place];  Service: Cardiovascular;  Laterality: N/A;  . CATARACT EXTRACTION Bilateral 2018   with lens implant  . CHOLECYSTECTOMY    . COLONOSCOPY    . ESOPHAGOGASTRODUODENOSCOPY    . IR IMAGING GUIDED PORT INSERTION  05/26/2018  . LYMPH NODE BIOPSY Left 04/27/2018   Procedure: EXCISIONAL BIOPSY OF LEFT CERVICAL LYMPH NODE;  Surgeon: Melida Quitter, MD;  Location: [place];  Service: ENT;  Laterality: Left;  . LYMPH NODE BIOPSY Left 11/23/2018   Procedure: LEFT CERVICAL LYMPH NODE OPEN BIOPSY;  Surgeon: Melida Quitter, MD;  Location: [place];  Service: ENT;  Laterality: Left;  . MENISCUS REPAIR Right 2009  . MITRAL VALVE REPAIR  2002   [facility]  . NM MYOVIEW LTD  07/22/2006   no ischemia  . RADICAL NECK DISSECTION Left 01/15/2019   Procedure: LEFT NECK DISSECTION;  Surgeon: Melida Quitter, MD;  Location: [place];  Service: ENT;  Laterality: Left;  . RIGHT HEART CATH  06/19/2004   normal right heart dynamics. EF 50%  . TEE WITHOUT CARDIOVERSION  02/12/2012   Procedure: TRANSESOPHAGEAL ECHOCARDIOGRAM (TEE);  Surgeon: Pixie Casino, MD;  Location: [facility] ENDOSCOPY;  Service: Cardiovascular;  Laterality: N/A;  . TEE WITHOUT CARDIOVERSION N/A 06/22/2013   Procedure: TRANSESOPHAGEAL ECHOCARDIOGRAM (TEE);  Surgeon: [name], MD;  Location: [place];  Service: Cardiovascular;  Laterality: N/A;  . THYROIDECTOMY N/A 01/15/2019   Procedure: TOTAL THYROIDECTOMY;  Surgeon: Melida Quitter, MD;  Location: [place];  Service: ENT;  Laterality: N/A;    There were no vitals filed for this visit.    Subjective Assessment - 12/23/19 1304    Subjective Still having the discomfort and  stiffness on the left side.  Nothing new since last time.  I have not stretched since last time,  I do the massage but it also does not help. 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 Pt did not have radiation. In Feb 2021 pt had vocal cord medialization and has completed [facility] and here.  Past history :12/2000 mitral valve repair, 10/2009 R knee arthroscopic repair of meniscus, 01/2012 cardioversion, 07/2012 retroperitoneal abdominal bleed, 11/2012- coiling of aneurysm- 4 abdominal aneurysms remain, 01/2013 cardioversion, 07/2013 ablation, 2018 cataract surgery, 01/2019 vocal cord/fold injections    Patient Stated Goals Get relief from discomfort in the neck    Currently in Pain? Yes    Pain Score 1     Pain Location Neck    Pain Orientation Left    Pain Descriptors / Indicators Discomfort    Pain Type Chronic pain    Pain Onset More than a month ago    Pain Frequency Constant    Aggravating Factors  looking up,              [facility] PT Assessment - 12/23/19 0001      Assessment   Medical Diagnosis papillary thyroid cancer, mantle cell lymphoma    Referring Provider (PT) Alvy Bimler    Onset Date/Surgical Date 05/21/18    Hand Dominance Right    Prior Therapy none      Precautions   Precautions Other (comment)    Precaution Comments at risk of lymphedema      Restrictions   Weight Bearing Restrictions No      Balance Screen   Has the patient fallen in the past 6 months No    Has the patient had a decrease in activity level because of a fear of falling?  No    Is the patient reluctant to leave their home because of a fear of falling?  No      Home Environment   Living Environment Private residence    Living Arrangements  Spouse/significant other    Available Help at Discharge Family    Type of [address] Access Level entry    [place] One level      Prior Function   Level of [place] Independent    Vocation Retired    Leisure lives at [place] for tasks assessed      Observation/Other Assessments   Observations  pt with old well healed scar at left lateral neck with newer scar at anterior neck some scar tissue tightness but no edema       Posture/Postural Control   Posture/Postural Control Postural limitations    Postural Limitations Rounded Shoulders;Forward head      AROM   Overall AROM Comments shoulders WFL    Cervical Flexion 50    Cervical Extension 47   back of the neck tightness   Cervical - Right Side Bend 23   pain back of the neck   Cervical - Left Side Bend 23   pain back of neck   Cervical - Right Rotation 45   suboccipital pain   Cervical - Left Rotation 48   suboccipital pain     Strength   Overall Strength Deficits    Overall Strength Comments appears to have gerneralized deconditioning, no focal deficits       Palpation   Palpation comment no edema present; scar tissue firm at the left neck                       Objective measurements completed on examination: See above findings.       [facility] Adult PT Treatment/Exercise - 12/23/19 0001      Exercises   Exercises Neck      Neck Exercises: Seated   Neck Retraction 5 reps    Neck Retraction Limitations with brief instruction.  Needs more review    Other Seated Exercise scapular retractions x 10      Manual Therapy   Soft tissue mobilization to the suboccipitals  with release 2x30" and then scar tissue mobilization mid incision working horizontally and vertically    Passive ROM of the cspine into rotation and lateral flexion bil                       PT Long Term Goals - 12/23/19 2122      PT LONG TERM  GOAL #1   Title Pt will report the fullness in his anterior neck is decreased by 50%    Time 6    Period Weeks    Status New      PT LONG TERM GOAL #2   Title Pt will improved right lateral side bending of neck to 35 degrees    Time 6    Period Weeks    Status New      PT LONG TERM GOAL #3   Title Pt will report the sensitivity of his skin on the left side of his neck is decreaed by 50%    Time 6    Period Weeks    Status New      PT LONG TERM GOAL #4   Title Pt will improve cervical AROM to no pain in the suboccipital region    Time 6    Period Weeks    Status New                  Plan - 12/23/19 2113    Clinical Impression Statement Pt returns to therapy here with continued discomfort on the left side of the neck mainly in the morning, pain in the suboccipital region with cervical AROM, decreased cervical AROM, and scar tissue restriction.  Pt was initially treated for some edema in the anterior neck although seems more like skin over incision at visit today.  Will continue with cervical AROM, scar tissue release, and PROM/stretching as  well as postural work    Charity fundraiser Factors and Comorbidities Age;Comorbidity 3+    Comorbidities heart problems (atrial fib with cardioversions), abdominal aneuryms (stable), ALND    Stability/Clinical Decision Making Stable/Uncomplicated    Rehab Potential Good    PT Frequency 2x / week    PT Duration 6 weeks    PT Treatment/Interventions ADLs/Self Care Home Management;Therapeutic exercise;Manual techniques;Manual lymph drainage;Patient/family education;Scar mobilization;Passive range of motion;Orthotic Fit/Training;Taping;Compression bandaging    PT Next Visit Plan review postural exercise (chin tuck, scapular retractions) try meeks, cervical PROM, suboccipital release/PROM, scar tissue release    PT Home Exercise Plan [id number]    Consulted and Agree with Plan of Care Patient           Patient will benefit from skilled therapeutic  intervention in order to improve the following deficits and impairments:  Pain, Increased fascial restricitons, Decreased scar mobility, Decreased range of motion, Decreased strength, Increased edema, Postural dysfunction, Increased muscle spasms  Visit Diagnosis: Cervicalgia  Aftercare following surgery for neoplasm  Abnormal posture     Problem List Patient Active Problem List   Diagnosis Date Noted  . Ascending aortic aneurysm ([place]) 11/23/2019  . Peripheral sensory neuropathy 11/17/2019  . Splenic artery aneurysm ([place]) 06/08/2019  . Deficiency anemia 04/19/2019  . Vitamin D deficiency 04/19/2019  . Hypocalcemia 04/19/2019  . Acquired lymphedema 02/22/2019  . Speech and language deficits 02/22/2019  . Thyroid cancer ([place]) 11/26/2018  . Lymphoma ([place]) 11/23/2018  . Hyperlipidemia 11/03/2018  . Neck discomfort 09/23/2018  . Other constipation 08/26/2018  . Pancytopenia, acquired ([place]) 07/28/2018  . Mantle cell lymphoma ([place]) 05/18/2018  . Insomnia secondary to chronic pain 10/28/2017  . OSA (obstructive sleep apnea) 10/28/2017  . OSA on CPAP 07/23/2017  . Neuropathy 07/23/2017  . RLS (restless legs syndrome) 07/23/2017  . Gallstones 03/30/2013  . Palpitations 02/16/2013  . 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 Pain in limb-Abdominal 09/21/2012  . Nontraumatic retroperitoneal hematoma, spontaneous while on anticoagulant therapy 08/29/2012  . Acute post-hemorrhagic anemia 08/29/2012  . Acute GI bleeding, spontaneous  08/06/2012  . 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 '02 08/06/2012  . Normal coronary arteries at cath 2006 (false positive Nuc) 08/06/2012  . 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 Contrast media allergy 08/06/2012  . Acute renal insufficiency 08/06/2012  . Hemorrhagic shock- now stable 08/04/2012  . Bradycardia- HR 58 NSR on admission 08/04/12 08/04/2012    Stark Bray 12/23/2019, 9:29  PM  [place], [place], [address] Phone: [phone]   Fax:  [phone]  Name: Sean Stanley MRN: [id number] Date of Birth: October 31, 1940  PHYSICAL THERAPY DISCHARGE SUMMARY  Visits from Start of Care: 1  Current functional level related to goals / functional outcomes: See above   Remaining deficits: See above   Education / Equipment: See above Plan: Patient agrees to discharge.  Patient goals were not met. Patient is being discharged due to not returning since the last visit.  ?????    Shan Levans, PT

## 2019-12-24 DIAGNOSIS — E7800 Pure hypercholesterolemia, unspecified: Secondary | ICD-10-CM | POA: Diagnosis not present

## 2019-12-24 DIAGNOSIS — N401 Enlarged prostate with lower urinary tract symptoms: Secondary | ICD-10-CM | POA: Diagnosis not present

## 2019-12-24 DIAGNOSIS — R351 Nocturia: Secondary | ICD-10-CM | POA: Diagnosis not present

## 2019-12-24 DIAGNOSIS — R3912 Poor urinary stream: Secondary | ICD-10-CM | POA: Diagnosis not present

## 2019-12-24 DIAGNOSIS — I722 Aneurysm of renal artery: Secondary | ICD-10-CM | POA: Diagnosis not present

## 2019-12-24 DIAGNOSIS — N400 Enlarged prostate without lower urinary tract symptoms: Secondary | ICD-10-CM | POA: Diagnosis not present

## 2019-12-30 ENCOUNTER — Telehealth: Payer: Self-pay | Admitting: Neurology

## 2019-12-30 NOTE — Telephone Encounter (Signed)
Pt called, would like to wean my self off of buPROPion (WELLBUTRIN XL) 150 MG 24 hr tablet. My cancer is in remission and do not need this medication anymore. Would like a call from the nurse to advise what to do.

## 2019-12-31 DIAGNOSIS — L986 Other infiltrative disorders of the skin and subcutaneous tissue: Secondary | ICD-10-CM | POA: Diagnosis not present

## 2019-12-31 DIAGNOSIS — D485 Neoplasm of uncertain behavior of skin: Secondary | ICD-10-CM | POA: Diagnosis not present

## 2019-12-31 NOTE — Telephone Encounter (Signed)
wellbutrin is used in depression, is not causing drowsiness or weight gain-  It is also used in some neuropathies, helping stinging and burning sensations. Please make Mr Sean Stanley aware of this. Weaning by going to 1 tab 150 mg every other day for 2 weeks, than stop.CD

## 2020-01-03 NOTE — Telephone Encounter (Signed)
Called the patient back to discuss weaning. I spoke with the wife. I advised of the schedule to wean off the medication but also wanted her to remind the pt that while this is classified as a antidepressant it has also been successful in treating some neuropathies. Advised to make sure he is aware of that in case he decides to continue with the medication as treatment of his neuropathy. She was appreciative for the information and will pass the information along to the patient.

## 2020-01-05 DIAGNOSIS — Z23 Encounter for immunization: Secondary | ICD-10-CM | POA: Diagnosis not present

## 2020-01-06 DIAGNOSIS — S60562D Insect bite (nonvenomous) of left hand, subsequent encounter: Secondary | ICD-10-CM | POA: Diagnosis not present

## 2020-01-06 DIAGNOSIS — L0889 Other specified local infections of the skin and subcutaneous tissue: Secondary | ICD-10-CM | POA: Diagnosis not present

## 2020-01-21 ENCOUNTER — Inpatient Hospital Stay: Payer: Medicare Other | Attending: Hematology and Oncology

## 2020-01-21 ENCOUNTER — Other Ambulatory Visit: Payer: Self-pay

## 2020-01-21 DIAGNOSIS — C8318 Mantle cell lymphoma, lymph nodes of multiple sites: Secondary | ICD-10-CM | POA: Insufficient documentation

## 2020-01-21 DIAGNOSIS — Z452 Encounter for adjustment and management of vascular access device: Secondary | ICD-10-CM | POA: Insufficient documentation

## 2020-01-21 MED ORDER — SODIUM CHLORIDE 0.9% FLUSH
10.0000 mL | Freq: Once | INTRAVENOUS | Status: AC
  Administered 2020-01-21: 10 mL
  Filled 2020-01-21: qty 10

## 2020-01-21 MED ORDER — HEPARIN SOD (PORK) LOCK FLUSH 100 UNIT/ML IV SOLN
500.0000 [IU] | Freq: Once | INTRAVENOUS | Status: AC
  Administered 2020-01-21: 500 [IU]
  Filled 2020-01-21: qty 5

## 2020-01-21 NOTE — Patient Instructions (Signed)

## 2020-02-01 ENCOUNTER — Encounter: Payer: Self-pay | Admitting: Hematology and Oncology

## 2020-03-16 ENCOUNTER — Other Ambulatory Visit: Payer: Self-pay

## 2020-03-16 ENCOUNTER — Inpatient Hospital Stay: Payer: Medicare Other | Attending: Hematology and Oncology

## 2020-03-16 DIAGNOSIS — Z452 Encounter for adjustment and management of vascular access device: Secondary | ICD-10-CM | POA: Insufficient documentation

## 2020-03-16 DIAGNOSIS — C8318 Mantle cell lymphoma, lymph nodes of multiple sites: Secondary | ICD-10-CM | POA: Insufficient documentation

## 2020-03-16 MED ORDER — HEPARIN SOD (PORK) LOCK FLUSH 100 UNIT/ML IV SOLN
250.0000 [IU] | Freq: Once | INTRAVENOUS | Status: AC
  Administered 2020-03-16: 500 [IU]
  Filled 2020-03-16: qty 5

## 2020-03-16 MED ORDER — SODIUM CHLORIDE 0.9% FLUSH
10.0000 mL | Freq: Once | INTRAVENOUS | Status: AC
  Administered 2020-03-16: 10 mL
  Filled 2020-03-16: qty 10

## 2020-03-16 NOTE — Patient Instructions (Signed)

## 2020-03-27 ENCOUNTER — Other Ambulatory Visit: Payer: Self-pay

## 2020-03-27 ENCOUNTER — Encounter: Payer: Self-pay | Admitting: Cardiology

## 2020-03-27 ENCOUNTER — Ambulatory Visit (INDEPENDENT_AMBULATORY_CARE_PROVIDER_SITE_OTHER): Payer: Medicare Other | Admitting: Cardiology

## 2020-03-27 VITALS — BP 138/88 | HR 67 | Ht 73.0 in | Wt 222.4 lb

## 2020-03-27 DIAGNOSIS — Z9889 Other specified postprocedural states: Secondary | ICD-10-CM

## 2020-03-27 DIAGNOSIS — E785 Hyperlipidemia, unspecified: Secondary | ICD-10-CM

## 2020-03-27 DIAGNOSIS — I714 Abdominal aortic aneurysm, without rupture, unspecified: Secondary | ICD-10-CM

## 2020-03-27 DIAGNOSIS — I340 Nonrheumatic mitral (valve) insufficiency: Secondary | ICD-10-CM

## 2020-03-27 DIAGNOSIS — I519 Heart disease, unspecified: Secondary | ICD-10-CM

## 2020-03-27 DIAGNOSIS — I059 Rheumatic mitral valve disease, unspecified: Secondary | ICD-10-CM | POA: Diagnosis not present

## 2020-03-27 DIAGNOSIS — I4892 Unspecified atrial flutter: Secondary | ICD-10-CM

## 2020-03-27 NOTE — Progress Notes (Signed)
Cardiology Office Note:    Date:  03/28/2020   ID:  Sean Stanley, DOB Aug 17, 1940, MRN [id number]  PCP:  [name], MD  [facility] Cardiologist:  Lolita Patella, MD  [place] Electrophysiologist:  None   Referring MD: [name], MD   Reason for visit: Establish cardiology care in [facility]  History of Present Illness:    Sean Stanley is a 80 y.o. male with a very complicated cardiac and oncology history we would like to transfer his care to [facility].  The patient is a [employer] veteran, he has been followed by Dr. Edwin Dada, cardiology is at [facility].  The patient underwent minimally invasive mitral valve repair in October 2002 at [facility] and has been followed there since then.  He was briefly seen at [facility] for episode of atrial flutter followed by cardioversion in 2014.  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 In May 2015 he underwent atrial flutter ablation. He has been followed by Dr. 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 He is currently in remission for both of his cancers. He has residual vocal cord dysfunction.  He states that he has been stable from cardiac standpoint, he underwent echocardiography at the [facility] in August 2021 that showed mild left ventricle dysfunction with LVEF of 45 to 50%, global longitudinal strain -12.8%, moderate concentric left ventricular hypertrophy, normal right ventricular systolic function, status post mitral valve repair with prosthetic mitral valve ring in place, with mild residual mitral regurgitation, moderately dilated left atrium, trivial tricuspid regurgitation,  normal size of the right atrium and normal right-sided pressures. The patient states that he does not have his usual stamina after therapy for post type of cancers however remains active, and denies any chest pain or shortness of breath. He has no lower extremity edema no orthopnea, no palpitations dizziness or syncope.  Past Medical History:  Diagnosis Date  . 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 Atrial flutter ([place])   . BPH (benign prostatic hyperplasia)   . Cancer ([place])    melanoma on lower right back and left chest - surgically removed and cleared  . Dysrhythmia   . 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 Headache    migraine- not current  . 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 History of blood transfusion   . Hypercholesteremia   . Hypercholesterolemia   . Lymphoma ([place]) 04/2018  . Mitral valve disease    annuloplasty 2002 [facility]  . Neuropathy   . Neuropathy of both feet    pt states due to exposure to [employer]  . OSA (obstructive sleep apnea)    does not use cpap, Dr. Maxwell Caul told him he had improved  . Pneumonia   .  Thoracic ascending aortic aneurysm (HCC)    4.5 cm 03/2018 CT  . Thyroid cancer [facility])     Past Surgical History:  Procedure Laterality Date  . ABDOMINAL ANGIOGRAM  08/05/12  . aneurysm repair    . CARDIOVERSION  02/12/2012   Procedure: CARDIOVERSION;  Surgeon: Pixie Casino, MD;  Location: [facility] ENDOSCOPY;  Service: Cardiovascular;  Laterality: N/A;  . CARDIOVERSION N/A 06/22/2013   Procedure: CARDIOVERSION;  Surgeon: [name], MD;  Location: [place];  Service: Cardiovascular;  Laterality: N/A;  . CATARACT EXTRACTION Bilateral 2018   with lens implant  . CHOLECYSTECTOMY    . COLONOSCOPY    . ESOPHAGOGASTRODUODENOSCOPY    . IR IMAGING GUIDED PORT INSERTION  05/26/2018  . LYMPH NODE BIOPSY Left 04/27/2018   Procedure: EXCISIONAL BIOPSY OF LEFT CERVICAL LYMPH NODE;  Surgeon: Melida Quitter, MD;  Location: [place];  Service: ENT;  Laterality: Left;  . LYMPH NODE BIOPSY Left 11/23/2018   Procedure: LEFT CERVICAL LYMPH NODE OPEN BIOPSY;  Surgeon: Melida Quitter, MD;  Location: [place];  Service: ENT;  Laterality: Left;  . MENISCUS REPAIR Right 2009  . MITRAL VALVE REPAIR  2002   [facility]  . NM MYOVIEW LTD  07/22/2006   no ischemia  . RADICAL NECK DISSECTION Left 01/15/2019   Procedure: LEFT NECK DISSECTION;  Surgeon: Melida Quitter, MD;  Location: [place];  Service: ENT;  Laterality: Left;  . RIGHT HEART CATH  06/19/2004   normal right heart dynamics. EF 50%  . TEE WITHOUT CARDIOVERSION  02/12/2012   Procedure: TRANSESOPHAGEAL ECHOCARDIOGRAM (TEE);  Surgeon: Pixie Casino, MD;  Location: [facility] ENDOSCOPY;  Service: Cardiovascular;  Laterality: N/A;  . TEE WITHOUT CARDIOVERSION N/A 06/22/2013   Procedure: TRANSESOPHAGEAL ECHOCARDIOGRAM (TEE);  Surgeon: [name], MD;  Location: [facility] ENDOSCOPY;  Service: Cardiovascular;  Laterality: N/A;  . THYROIDECTOMY N/A 01/15/2019   Procedure: TOTAL THYROIDECTOMY;  Surgeon: Melida Quitter, MD;  Location[facility] OR;  Service: ENT;  Laterality: N/A;    Current Medications: Current Meds  Medication Sig  . acetaminophen (TYLENOL) 500 MG tablet Take 1,000 mg by mouth daily as needed for moderate pain.  Marland Kitchen alfuzosin (UROXATRAL) 10 MG 24 hr tablet Take 10 mg by mouth at bedtime.   Marland Kitchen buPROPion (WELLBUTRIN XL) 150 MG 24 hr tablet Take 1 tablet (150 mg total) by mouth daily.  . Cholecalciferol (VITAMIN D) 2000 units tablet Take 2,000 Units by mouth at bedtime.   Marland Kitchen ezetimibe (ZETIA) 10 MG tablet Take 10 mg by mouth at  bedtime.  . finasteride (PROSCAR) 5 MG tablet Take 5 mg by mouth at bedtime.   Marland Kitchen levothyroxine (SYNTHROID) 175 MCG tablet Take 1 tablet (175 mcg total) by mouth daily.  Marland Kitchen lidocaine-prilocaine (EMLA) cream Apply to affected area once  . metoprolol succinate (TOPROL-XL) 25 MG 24 hr tablet Take 12.5 mg by mouth daily.   Vladimir Faster Glycol-Propyl Glycol (SYSTANE HYDRATION PF OP) Place 1 drop into both eyes 3 (three) times daily as needed (dry eyes).      Allergies:   Aspirin, Levaquin [levofloxacin in d5w], Nickel, Other, Statins, Allopurinol, and Ampicillin   Social History   Socioeconomic History  . Marital status: Married    Spouse name: [name]  . Number of children: 2  . Years of education: Not on file  . Highest education level: Not on file  Occupational History  . Occupation: retired Hydrologist  Tobacco Use  . Smoking status: Never Smoker  . Smokeless tobacco: Never  Used  Vaping Use  . Vaping Use: Never used  Substance and Sexual Activity  . Alcohol use: Not Currently    Comment: social  . Drug use: No  . Sexual activity: Not on file  Other Topics Concern  . Not on file  Social History Narrative   [place] in [place], Retired   Social Determinants of Radio broadcast assistant Strain: Not on [employer] Insecurity: Not on file  Transportation Needs: Not on file  Physical Activity: Not on file  Stress: Not on file  Social Connections: Not on file    Family History: The patient's family history includes Cancer in his maternal grandmother; Heart attack in his maternal grandfather, paternal grandfather, and paternal grandmother; Heart failure (age of onset: 77) in his father; Parkinson's disease (age of onset: 30) in his mother.  ROS:   Please see the history of present illness.    All other systems reviewed and are negative.  EKGs/Labs/Other Studies Reviewed:    The following studies were reviewed today:  EKG:  EKG is ordered today.  The ekg ordered today demonstrates  sinus rhythm, normal EKG, this was personally reviewed.  Recent Labs: 11/22/2019: ALT 22; BUN 21; Creatinine 1.22; Hemoglobin 13.7; Platelet Count 159; Potassium 4.5; Sodium 138  Recent Lipid Panel    Component Value Date/Time   CHOL 185 02/22/2019 0757   TRIG 115 02/22/2019 0757   HDL 58 02/22/2019 0757   CHOLHDL 3.2 02/22/2019 0757   VLDL 23 02/22/2019 0757   LDLCALC 104 (H) 02/22/2019 0757    Physical Exam:    VS:  BP 138/88   Pulse 67   Ht 6\' 1"  (1.854 m)   Wt 222 lb 6.4 oz (100.9 kg)   SpO2 96%   BMI 29.34 kg/m     Wt Readings from Last 3 Encounters:  03/27/20 222 lb 6.4 oz (100.9 kg)  11/23/19 220 lb (99.8 kg)  11/17/19 218 lb (98.9 kg)    GEN:  Well nourished, well developed in no acute distress HEENT: Normal NECK: No JVD; No carotid bruits LYMPHATICS: No lymphadenopathy CARDIAC: RRR, 2 out of 6 systolic murmur, rubs, gallops RESPIRATORY:  Clear to auscultation without rales, wheezing or rhonchi  ABDOMEN: Soft, non-tender, non-distended MUSCULOSKELETAL:  No edema; No deformity  SKIN: Warm and dry NEUROLOGIC:  Alert and oriented x 3 PSYCHIATRIC:  Normal affect   ASSESSMENT:    1. 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/ring by Dr Evelina Dun at [facility] '02   2. Hyperlipidemia, unspecified hyperlipidemia type   3. S/P mitral valve repair   4. Nonrheumatic mitral valve regurgitation   5. Paroxysmal atrial flutter (HCC)   6. LV dysfunction   7. AAA (abdominal aortic aneurysm) without rupture (HCC)    PLAN:    In order of problems listed above:  1. Status post minimally invasive mitral repair with mild residual mitral regurgitation with normal mean transmitral gradient of 1 mmHg. The patient has mild LV dysfunction with LVEF 45 to 50% that seems to be slightly improved from the echocardiogram performed here at [facility] in March 2015 when it was 40 to 45%. The patient has no signs of fluid overload. 2. Paroxysmal atrial flutter -there appears to be no  recurrence since ablation in 2015. The patient is not on any anticoagulation. He is in sinus rhythm today. 3. Hyperlipidemia -with no known coronary artery disease, currently on Zetia, with most recent lipid panel in November 2020 with HDL 58, LDL 104 and triglycerides  115. 4. Multiple abdominal aortic aneurysms, status post partial coiling, he is being followed by Delsa Bern, PA at [facility].  Medication Adjustments/Labs and Tests Ordered: Current medicines are reviewed at length with the patient today.  Concerns regarding medicines are outlined above.  Orders Placed This Encounter  Procedures  . EKG 12-Lead   No orders of the defined types were placed in this encounter.   Patient Instructions  Medication Instructions:   Your physician recommends that you continue on your current medications as directed. Please refer to the Current Medication list given to you today.  *If you need a refill on your cardiac medications before your next appointment, please call your pharmacy*   Follow-Up: At [facility], you and your health needs are our priority.  As part of our continuing mission to provide you with exceptional heart care, we have created designated Provider Care Teams.  These Care Teams include your primary Cardiologist (physician) and Advanced Practice Providers (APPs -  Physician Assistants and Nurse Practitioners) who all work together to provide you with the care you need, when you need it.  We recommend signing up for the patient portal called "MyChart".  Sign up information is provided on this After Visit Summary.  MyChart is used to connect with patients for Virtual Visits (Telemedicine).  Patients are able to view lab/test results, encounter notes, upcoming appointments, etc.  Non-urgent messages can be sent to your provider as well.   To learn more about what you can do with MyChart, go to [email or web address].    Your next appointment:   6 month(s)  The format for your next  appointment:   In Person  Provider:   Tobias Alexander, MD        Signed, Tobias Alexander, MD  03/28/2020 1:17 PM    [facility]

## 2020-03-27 NOTE — Patient Instructions (Signed)

## 2020-04-27 DIAGNOSIS — I712 Thoracic aortic aneurysm, without rupture: Secondary | ICD-10-CM | POA: Diagnosis not present

## 2020-04-27 DIAGNOSIS — I059 Rheumatic mitral valve disease, unspecified: Secondary | ICD-10-CM | POA: Diagnosis not present

## 2020-04-27 DIAGNOSIS — I5032 Chronic diastolic (congestive) heart failure: Secondary | ICD-10-CM | POA: Diagnosis not present

## 2020-04-27 DIAGNOSIS — I472 Ventricular tachycardia: Secondary | ICD-10-CM | POA: Diagnosis not present

## 2020-04-27 DIAGNOSIS — R002 Palpitations: Secondary | ICD-10-CM | POA: Diagnosis not present

## 2020-04-27 DIAGNOSIS — E782 Mixed hyperlipidemia: Secondary | ICD-10-CM | POA: Diagnosis not present

## 2020-04-27 DIAGNOSIS — I483 Typical atrial flutter: Secondary | ICD-10-CM | POA: Diagnosis not present

## 2020-05-17 ENCOUNTER — Encounter: Payer: Self-pay | Admitting: Neurology

## 2020-05-17 ENCOUNTER — Ambulatory Visit (INDEPENDENT_AMBULATORY_CARE_PROVIDER_SITE_OTHER): Payer: Medicare Other | Admitting: Neurology

## 2020-05-17 ENCOUNTER — Other Ambulatory Visit: Payer: Self-pay

## 2020-05-17 VITALS — BP 116/75 | HR 69 | Ht 73.0 in | Wt 217.0 lb

## 2020-05-17 DIAGNOSIS — G6289 Other specified polyneuropathies: Secondary | ICD-10-CM

## 2020-05-17 DIAGNOSIS — Z77098 Contact with and (suspected) exposure to other hazardous, chiefly nonmedicinal, chemicals: Secondary | ICD-10-CM

## 2020-05-17 DIAGNOSIS — G2581 Restless legs syndrome: Secondary | ICD-10-CM

## 2020-05-17 DIAGNOSIS — G608 Other hereditary and idiopathic neuropathies: Secondary | ICD-10-CM | POA: Insufficient documentation

## 2020-05-17 DIAGNOSIS — C8318 Mantle cell lymphoma, lymph nodes of multiple sites: Secondary | ICD-10-CM

## 2020-05-17 MED ORDER — LIDOCAINE-PRILOCAINE 2.5-2.5 % EX CREA: TOPICAL_CREAM | CUTANEOUS | 3 refills | Status: DC

## 2020-05-17 MED ORDER — ROPINIROLE HCL 0.25 MG PO TABS: 0.2500 mg | ORAL_TABLET | Freq: Every day | ORAL | 5 refills | Status: DC

## 2020-05-17 NOTE — Patient Instructions (Signed)
Lidocaine; Prilocaine cream What is this medicine? LIDOCAINE; PRILOCAINE (LYE doe kane; PRIL oh kane) is a topical anesthetic that causes loss of feeling in the skin and surrounding tissues. It is used to numb the skin before procedures or injections. This medicine may be used for other purposes; ask your health care provider or pharmacist if you have questions. COMMON BRAND NAME(S): ANODYNE LPT, DermacinRx, EMLA, IV Novice Pack, Lido-Prilo Caine, Lidotor, LiProZonePak, [employer], LP [employer], NIKE with W. R. [employer]kley, Microvix LP, [place], [place]-II, Port-Prep, PrepIV, PRILOHEAL PLUS 30, Prilovix, AGCO Corporation, Prilovix [employer], Prilovix Plus, Prilovix Ultralite, Prilovix [employer], Prilovixil, Pri[place]-II, REAL HEAL-I, Relador, [place], [facility], VallaDerm-90 What should I tell my health care provider before I take this medicine? They need to know if you have any of these conditions:  G6PD deficiency  heart disease  kidney disease  liver disease  an unusual or allergic reaction to lidocaine, prilocaine, other medicines, foods, dyes, or preservatives  pregnant or trying to get pregnant  breast-feeding How should I use this medicine? This medicine is for external use only on the skin. Do not take by mouth. Follow the directions on the prescription label. Wash hands before and after use. Do not use more or leave in contact with the skin longer than directed. Do not apply to eyes or open wounds. It can cause irritation and blurred or temporary loss of vision. If this medicine comes in contact with your eyes, immediately rinse the eye with water. Do not touch or rub the eye. Contact your health care provider right away. Talk to your pediatrician regarding the use of this medicine in children. While this medicine may be prescribed for children for selected conditions, precautions do apply. Overdosage: If you think you have taken too much of this medicine contact a poison  control center or emergency room at once. NOTE: This medicine is only for you. Do not share this medicine with others. What if I miss a dose? This medicine is usually only applied once prior to each procedure. It must be in contact with the skin for a period of time for it to work. If you applied this medicine later than directed, tell your health care professional before starting the procedure. What may interact with this medicine? This medicine may interact with the following medications:  acetaminophen  certain antibiotics like dapsone, nitrofurantoin, aminosalicylic acid, sulfasalazine  certain medicines for seizures like phenobarbital, phenytoin, valproic acid  chloroquine  cyclophosphamide  flutamide  hydroxyurea  ifosfamide  metoclopramide  nitroglycerin  other local anesthetics like pramoxine, tetracaine  primaquine  quinine This list may not describe all possible interactions. Give your health care provider a list of all the medicines, herbs, non-prescription drugs, or dietary supplements you use. Also tell them if you smoke, drink alcohol, or use illegal drugs. Some items may interact with your medicine. What should I watch for while using this medicine? Be careful to avoid injury to the treated area while it is numb and you are not aware of pain. Avoid scratching, rubbing, or exposing the treated area to hot or cold temperatures until complete sensation has returned. The numb feeling will wear off a few hours after applying the cream. What side effects may I notice from receiving this medicine? Side effects that you should report to your doctor or health care professional as soon as possible:  blurred vision  chest pain  difficulty breathing  dizziness  drowsiness  fast or irregular heartbeat  skin rash or itching  swelling of your throat, lips, or face  trembling Side effects that usually do not require medical attention (report to your doctor or  health care professional if they continue or are bothersome):  changes in ability to feel hot or cold  redness and swelling at the application site This list may not describe all possible side effects. Call your doctor for medical advice about side effects. You may report side effects to FDA at 1-800-FDA-1088. Where should I keep my medicine? Keep out of reach of children. Store at room temperature between 15 and 30 degrees C (59 and 86 degrees F). Keep container tightly closed. Throw away any unused medicine after the expiration date. NOTE: This sheet is a summary. It may not cover all possible information. If you have questions about this medicine, talk to your doctor, pharmacist, or health care provider.  2021 Elsevier/Gold Standard (2017-03-03 10:11:16) Neuropathic Pain Neuropathic pain is pain caused by damage to the nerves that are responsible for certain sensations in your body (sensory nerves). The pain can be caused by:  Damage to the sensory nerves that send signals to your spinal cord and brain (peripheral nervous system).  Damage to the sensory nerves in your brain or spinal cord (central nervous system). Neuropathic pain can make you more sensitive to pain. Even a minor sensation can feel very painful. This is usually a long-term condition that can be difficult to treat. The type of pain differs from person to person. It may:  Start suddenly (acute), or it may develop slowly and last for a long time (chronic).  Come and go as damaged nerves heal, or it may stay at the same level for years.  Cause emotional distress, loss of sleep, and a lower quality of life. What are the causes? The most common cause of this condition is diabetes. Many other diseases and conditions can also cause neuropathic pain. Causes of neuropathic pain can be classified as:  Toxic. This is caused by medicines and chemicals. The most common cause of toxic neuropathic pain is damage from cancer treatments  (chemotherapy).  Metabolic. This can be caused by: ? Diabetes. This is the most common disease that damages the nerves. ? Lack of vitamin B from long-term alcohol abuse.  Traumatic. Any injury that cuts, crushes, or stretches a nerve can cause damage and pain. A common example is feeling pain after losing an arm or leg (phantom limb pain).  Compression-related. If a sensory nerve gets trapped or compressed for a long period of time, the blood supply to the nerve can be cut off.  Vascular. Many blood vessel diseases can cause neuropathic pain by decreasing blood supply and oxygen to nerves.  Autoimmune. This type of pain results from diseases in which the body's defense system (immune system) mistakenly attacks sensory nerves. Examples of autoimmune diseases that can cause neuropathic pain include lupus and multiple sclerosis.  Infectious. Many types of viral infections can damage sensory nerves and cause pain. Shingles infection is a common cause of this type of pain.  Inherited. Neuropathic pain can be a symptom of many diseases that are passed down through families (genetic). What increases the risk? You are more likely to develop this condition if:  You have diabetes.  You smoke.  You drink too much alcohol.  You are taking certain medicines, including medicines that kill cancer cells (chemotherapy) or that treat immune system disorders. What are the signs or symptoms? The main symptom is pain. Neuropathic pain is often described as:  Burning.  Shock-like.  Stinging.  Hot or cold.  Itching. How is this diagnosed? No single test can diagnose neuropathic pain. It is diagnosed based on:  Physical exam and your symptoms. Your health care provider will ask you about your pain. You may be asked to use a pain scale to describe how bad your pain is.  Tests. These may be done to see if you have a high sensitivity to pain and to help find the cause and location of any sensory  nerve damage. They include: ? Nerve conduction studies to test how well nerve signals travel through your sensory nerves (electrodiagnostic testing). ? Stimulating your sensory nerves through electrodes on your skin and measuring the response in your spinal cord and brain (somatosensory evoked potential).  Imaging studies, such as: ? X-rays. ? CT scan. ? MRI. How is this treated? Treatment for neuropathic pain may change over time. You may need to try different treatment options or a combination of treatments. Some options include:  Treating the underlying cause of the neuropathy, such as diabetes, kidney disease, or vitamin deficiencies.  Stopping medicines that can cause neuropathy, such as chemotherapy.  Medicine to relieve pain. Medicines may include: ? Prescription or over-the-counter pain medicine. ? Anti-seizure medicine. ? Antidepressant medicines. ? Pain-relieving patches that are applied to painful areas of skin. ? A medicine to numb the area (local anesthetic), which can be injected as a nerve block.  Transcutaneous nerve stimulation. This uses electrical currents to block painful nerve signals. The treatment is painless.  Alternative treatments, such as: ? Acupuncture. ? Meditation. ? Massage. ? Physical therapy. ? Pain management programs. ? Counseling. Follow these instructions at home: Medicines  Take over-the-counter and prescription medicines only as told by your health care provider.  Do not drive or use heavy machinery while taking prescription pain medicine.  If you are taking prescription pain medicine, take actions to prevent or treat constipation. Your health care provider may recommend that you: ? Drink enough fluid to keep your urine pale yellow. ? Eat foods that are high in fiber, such as fresh fruits and vegetables, whole grains, and beans. ? Limit foods that are high in fat and processed sugars, such as fried or sweet foods. ? Take an  over-the-counter or prescription medicine for constipation.   Lifestyle  Have a good support system at home.  Consider joining a chronic pain support group.  Do not use any products that contain nicotine or tobacco, such as cigarettes and e-cigarettes. If you need help quitting, ask your health care provider.  Do not drink alcohol.   General instructions  Learn as much as you can about your condition.  Work closely with all your health care providers to find the treatment plan that works best for you.  Ask your health care provider what activities are safe for you.  Keep all follow-up visits as told by your health care provider. This is important. Contact a health care provider if:  Your pain treatments are not working.  You are having side effects from your medicines.  You are struggling with tiredness (fatigue), mood changes, depression, or anxiety. Summary  Neuropathic pain is pain caused by damage to the nerves that are responsible for certain sensations in your body (sensory nerves).  Neuropathic pain may come and go as damaged nerves heal, or it may stay at the same level for years.  Neuropathic pain is usually a long-term condition that can be difficult to treat. Consider joining a chronic pain  support group. This information is not intended to replace advice given to you by your health care provider. Make sure you discuss any questions you have with your health care provider. Document Revised: 07/02/2018 Document Reviewed: 03/28/2017 Elsevier Patient Education  2021 [employer].

## 2020-05-17 NOTE — Progress Notes (Signed)
SLEEP MED[facility]INE CLINIC   Provider:  Larey Stanley, M D  Primary Care Physician:  Sean Huddle, MD   Referring Provider: [name], MD   Chief Complaint  Patient presents with  . Follow-up    Pt alone, rm 11. Presents for yearly visit. Overall neuropathy is worsening. He is hoping there is a cream or something that can be tried that may be effective in helping.     I have the pleasure of seeing Sean Stanley again on 05-17-2020-  A 80 year old  Caucasian gentleman, who had a another rough year.  His neuropathy is progression, from the toes ascending above the ankle, pain keeps him awake at night. 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 In February 2020 left neck node lymph node excision for biopsy diagnosed with mantle cell B non-Hodgkin's lymphoma treated here locally at [facility] has a Port-A-Cath has been on rituximab a drug we are very familiar with the neurology as well.  July 2020 completion of his chemotherapy and now considered in remission.  In July 2021 he was still in remission, had a port- a -cath removed. [place], [place]- treatable , not cureable.   He takes Tylenol and CBD oil skin treatment. Tried Voltaren - he is only in pain at rest and not while up and about. Left side worse then the right. Creepy crawly sensation- RLS ? He massages it to get relief.    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  EMG evaluation of the right lower extremity shows chronic stable distal signs of neuropathic denervation consistent with the diagnosis of peripheral neuropathy.  There is no evidence of an overlying lumbosacral radiculopathy. 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  Cataract surgeries in 2018 . In February 2020 left neck node lymph node excision for biopsy diagnosed with mantle cell B non-Hodgkin's lymphoma treated here locally at [facility] lung [facility] center has a Port-A-Cath has been on rituximab a drug we are very familiar with the neurology as well.  July 2020 completion of his chemotherapy and now considered in remission.   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  However he is speaking portable and in this quiet room I can hear him very well.  In May of this year he had to take bite and was treated preventively with doxycycline and in October 2021 he is planned to have an enlarged prostate procedure.  He also had a [facility] directed exam for high-frequency hearing loss.  Medication list is mildly unchanged.  He applies EMLA to the affected area, he has to supplement thyroid hormone now  with Synthroid 175 mcg daily he has dry eyes until he takes polyethylene Stanley appropriately.  He is on Wellbutrin and this works out well.  Acetaminophen as needed Zetia at bedtime, Proscar at bedtime.  Has difficulties sleeping. 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 He is reluctant to speak as people lean in to hear him, and he  Worries about Covid.  Mother had parkinson's disease.     Video visit 08-2018 History  of Present Illness: patient with neuropathy and recently diagnosed Lymphoma, having success with neuropathy control by walking regularly, only needing occasionally tylenol.  Observations/Objective: reports improvement in sleep duration and quality. Patient presents well groomed, in no acute distress.  Sean Stanley is a 80 year old Caucasian right-handed gentleman with an underlying medical history of non-Hodgkin lymphoma, neuropathy, he was evaluated for sleep apnea which was mild and strictly positional without associated hypoxemia and there were no PLM's.  Yet his neuropathy has kept him from sleeping well and he has reported very fragmented sleep.  Sean. Jannifer Stanley had performed an EMG and nerve conduction study which showed no radiculopathy but clearly an axonal neuropathy.  This is a neuropathy form that can be seen with agent orange exposure, which the patient had during the [place] War.  This neuropathy has ascended over the last 25 years slowly but steadily.     RV 04-30-2018, Sean Stanley is a 80 y.o. male , seen here as in a referral  from Sean Stanley and upon recommendation of Sean Stanley for evaluation of neuropathy. Neuropathy has impaired his sleep. He is relieved to hear that his apnea was strictly positional, no hypoxemia and no PLMs. He had nocturia and is followed by Sean Stanley. He has just heard from his ENT physician that he has Non- Hodkins Lymphoma, a neck lymph node, which was felt and found enlarged by CT. Sean Redmond Stanley. This is a shock. He had a MOCA today, 26/ 30 points.       HPI:  Sean Stanley is a 79 y.o. male , seen here as in a referral  from Sean Stanley and upon recommendation of Sean Stanley for evaluation of neuropathy.  Neuropathy has impaired his sleep. He is relieved to hear that his apnea was strictly positional, no hypoxemia and no PLMs. He had nocturia and is followed by Sean Stanley.   Today's 28 October 2017, and that he was summarized the patient's baseline  polysomnography on 12 September 2017.  The apnea hypopnea index was 18.2, in supine position 75.8 apneas per hour, in nonsupine position 2.7/h.  There was no REM exenteration.  There was no evidence of hypoxemia time spent below 89% oxygen saturation was 0.1-minute.  The patient had only 5 periodic limb movements but many spontaneous arousals that may have been pain or discomfort related, the EKG was not keeping with a sinus rhythm he had some intermittent PACs and PVCs.  Facet the patient would not require CPAP intervention if he could train him to sleep on his side.  We discussed the tennis ball method or other behavior changing methods to help him avoid supine sleep.  I would also mentioned to say that he will sleep better once he is entrained into the sleep position.   His wife was also interested in the neuropathy work up- he has only had a low Vit D level, no autoimmune evidence. His EMG was interrepted by Sean Sean Stanley. No radiculopathy- he has sensory issues, pain and feels numb in both feet.  He likes the foot spa, jet massage . Wearing supportive shoes, treating pain.  Using trazodone for sleep- avoiding anticholinergic agents.  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 Wife asked about hemp topical oils.   I have the pleasure of seeing Sean Stanley as a new patient to mostly practice on 23 Jul 2017.  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  About 25 years ago he began noticing numbness in his toes which from their ascended to the lower extremities.  It has not affected the upper extremities.  He does not have an associated skin rash.  He does not have raynaudes syndrome.  He has noted that when he is physically more active and more on his feet he will have more pain at night and sometimes also  has muscle spasms in the lower extremities especially the gastrocnemius, described as a charley horse. He has been woken by these cramps.  It comes and goes, not lasting long but very, very painful.He is of [place] ancestry, [place].    Chief complaint according to patient : sleep disorder, neuropathy, agent orange exposure.  Sleep habits are as follows: The patient's bedtime is between 10 and 10:30 PM, the bedroom is cool, quiet dark. He sleeps within 15 minutes, but wakes up for 2-3 times due to nocturia. He has increased exercise recently, feels this improved his sleep time. Fragmented sleep, total  Sleep time of 5 hours in a good night. Naps in daytime whenever he sits still. Wakes not refreshed, excessive daytime sleepiness. Wife stated he snores. -Was placed once on CPAP but quit using it. No family history of insomnia or neuropathy.   Sleep medical history and family sleep history:   Atrial fibrillation,  Sean. Esau Grew. cardioablation after many cardioversions has held up! mother had parkinson's disease. No family history of NP , no genetic testing.  Had Agent orange exposure.  This can be a VA claim .      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  No smoking history, ETOH : drinks hard liquor, now only wine . Caffeine ; 1 cup in PM, decaffeinated- soda and no iced teas.     Review of Systems: Out of a complete 14 system review, the patient complains of only the following symptoms, and all other reviewed systems are negative.  Insomnia, painful neuropathy, thyroid cancer , Lymphoma.    Social History   Socioeconomic History  . Marital status: Married    Spouse name: [name]  . Number of children: 2  . Years of education: Not on file  . Highest education level: Not on file  Occupational History  . Occupation: retired Hydrologist  Tobacco Use  . Smoking status: Never Smoker  . Smokeless tobacco: Never Used  Vaping Use   . Vaping Use: Never used  Substance and Sexual Activity  . Alcohol use: Not Currently    Comment: social  . Drug use: No  . Sexual activity: Not on file  Other Topics Concern  . Not on file  Social History Narrative   Lives in [place], Retired   Social Determinants of Radio broadcast assistant Strain: Not on [employer] Insecurity: Not on file  Transportation Needs: Not  on file  Physical Activity: Not on file  Stress: Not on file  Social Connections: Not on file  Intimate Partner Violence: Not on file    Family History  Problem Relation Age of Onset  . Parkinson's disease Mother 48  . Heart failure Father 35  . Cancer Maternal Grandmother   . Heart attack Maternal Grandfather   . Heart attack Paternal Grandmother   . Heart attack Paternal Grandfather     Past Medical History:  Diagnosis Date  . 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 Atrial flutter ([place])   . BPH (benign prostatic hyperplasia)   . Cancer ([place])    melanoma on lower right back and left chest - surgically removed and cleared  . Dysrhythmia   . 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 Headache    migraine- not current  . 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 History of blood transfusion   . Hypercholesteremia   . Hypercholesterolemia   . Lymphoma ([place]) 04/2018  . Mitral valve disease    annuloplasty 2002 [facility]  . Neuropathy   . Neuropathy of both feet    pt states due to exposure to [employer]  . OSA (obstructive sleep apnea)    does not use cpap, Sean. Maxwell Caul told him he had improved  .  Pneumonia   . Thoracic ascending aortic aneurysm (H[facility])    4.5 cm 03/2018 CT  . Thyroid cancer [facility])     Past Surgical History:  Procedure Laterality Date  . ABDOMINAL ANGIOGRAM  08/05/12  . aneurysm repair    . CARDIOVERSION  02/12/2012   Procedure: CARDIOVERSION;  Surgeon: Pixie Casino, MD;  Location: [facility] ENDOSCOPY;  Service: Cardiovascular;  Laterality: N/A;  . CARDIOVERSION N/A 06/22/2013   Procedure: CARDIOVERSION;  Surgeon: [name], MD;  Location: [place];  Service: Cardiovascular;  Laterality: N/A;  . CATARACT EXTRACTION Bilateral 2018   with lens implant  . CHOLECYSTECTOMY    . COLONOSCOPY    . ESOPHAGOGASTRODUODENOSCOPY    . IR IMAGING GUIDED PORT INSERTION  05/26/2018  . LYMPH NODE BIOPSY Left 04/27/2018   Procedure: EXCISIONAL BIOPSY OF LEFT CERVICAL LYMPH NODE;  Surgeon: Melida Quitter, MD;  Location: [place];  Service: ENT;  Laterality: Left;  . LYMPH NODE BIOPSY Left 11/23/2018   Procedure: LEFT CERVICAL LYMPH NODE OPEN BIOPSY;  Surgeon: Melida Quitter, MD;  Location: [place];  Service: ENT;  Laterality: Left;  . MENISCUS REPAIR Right 2009  . MITRAL VALVE REPAIR  2002   [facility]  . NM MYOVIEW LTD  07/22/2006   no ischemia  . RADICAL NECK DISSECTION Left 01/15/2019   Procedure: LEFT NECK DISSECTION;  Surgeon: Melida Quitter, MD;  Location: [place];  Service: ENT;  Laterality: Left;  . RIGHT HEART CATH  06/19/2004   normal right heart dynamics. EF 50%  . TEE WITHOUT CARDIOVERSION  02/12/2012   Procedure: TRANSESOPHAGEAL ECHOCARDIOGRAM (TEE);  Surgeon: Pixie Casino, MD;  Location: [facility] ENDOSCOPY;  Service: Cardiovascular;  Laterality: N/A;  . TEE WITHOUT  CARDIOVERSION N/A 06/22/2013   Procedure: TRANSESOPHAGEAL ECHOCARDIOGRAM (TEE);  Surgeon: [name], MD;  Location: [place];  Service: Cardiovascular;  Laterality: N/A;  . THYROIDECTOMY N/A 01/15/2019   Procedure: TOTAL THYROIDECTOMY;  Surgeon: Melida Quitter, MD;  Location: [place];  Service: ENT;  Laterality:  N/A;    Current Outpatient Medications  Medication Sig Dispense Refill  . acetaminophen (TYLENOL) 500 MG tablet Take 1,000 mg by mouth daily as needed for moderate pain.    Marland Kitchen alfuzosin (UROXATRAL) 10 MG 24 hr tablet Take 10 mg by mouth at bedtime.     Marland Kitchen buPROPion (WELLBUTRIN XL) 150 MG 24 hr tablet Take 1 tablet (150 mg total) by mouth daily. 90 tablet 3  . Cholecalciferol (VITAMIN D) 2000 units tablet Take 2,000 Units by mouth at bedtime.     Marland Kitchen ezetimibe (ZETIA) 10 MG tablet Take 10 mg by mouth at bedtime.    . finasteride (PROSCAR) 5 MG tablet Take 5 mg by mouth at bedtime.     Marland Kitchen levothyroxine (SYNTHROID) 175 MCG tablet Take 1 tablet (175 mcg total) by mouth daily.    Marland Kitchen lidocaine-prilocaine (EMLA) cream Apply to affected area once 30 g 3  . metoprolol succinate (TOPROL-XL) 25 MG 24 hr tablet Take 12.5 mg by mouth daily.     Sean Stanley (SYSTANE HYDRATION PF OP) Place 1 drop into both eyes 3 (three) times daily as needed (dry eyes).      No current facility-administered medications for this visit.    Allergies as of 05/17/2020 - Review Complete 05/17/2020  Allergen Reaction Noted  . Aspirin  04/24/2018  . Levaquin [levofloxacin in d5w]  07/22/2017  . Nickel Itching 07/22/2017  . Other  11/19/2018  . Statins Other (See Comments) 03/30/2013  . Allopurinol Rash 06/17/2018  . Ampicillin Rash 11/16/2012    Vitals: BP 116/75   Pulse 69   Ht 6\' 1"  (1.854 m)   Wt 217 lb (98.4 kg)   BMI 28.63 kg/m  Last Weight:  Wt Readings from Last 1 Encounters:  05/17/20 217 lb (98.4 kg)   DGU:YQIH mass index is 28.63 kg/m.     Last Height:   Ht Readings from Last 1 Encounters:  05/17/20 6\' 1"  (1.854 m)    Physical exam:  General: The patient is awake, alert and appears not in acute distress. The patient is well groomed. Head: Normocephalic, atraumatic. Neck is supple. Mallampati 4  neck circumference:17. 5 . Nasal airflow patent ,   Cardiovascular:  Regular rate  and rhythm , without  murmurs or carotid bruit, and without distended neck veins. Status post mitral valve repair.  Respiratory: Lungs are clear to auscultation. Skin:  Without evidence of edema, or rash Trunk: BMI is 29. The patient's posture is erect   Neurologic exam : The patient is awake and alert, oriented to place and time.   Attention span & concentration ability appears normal.  Speech is fluent,  without   dysarthria, dysphonia or aphasia.  Mood and affect are appropriate.  Cranial nerves: No change in taste or smell.  Pupils are equal and reactive to light.Extraocular movements  in vertical and horizontal planes intact and without nystagmus. Visual fields by finger perimetry are intact. Hearing to finger rub intact.   Facial sensation intact to fine touch.  Facial motor strength is symmetric and tongue and uvula move midline. Shoulder shrug was symmetrical.   Motor exam:  Normal tone, muscle bulk and symmetric strength in all extremities. Sensory:  Vibration completely lost below knee , ascending skin changes, dystrophy, hair loss, shiny skin.   Coordination: Rapid alternating movements in the fingers/hands was normal.  Finger-to-nose maneuver  normal without evidence of ataxia, dysmetria or tremor. Gait and station: Patient walks without assistive device and is able unassisted to climb up to the exam table. Strength within normal limits. Stance is stable and normal. He still turns with 3  Steps.  Deep tendon reflexes: in the  upper and lower extremities are symmetric and intact. Babinski maneuver response is  downgoing.   Assessment:   30 minutes - After physical and neurologic examination, review of laboratory studies,  Personal review of imaging studies, reports of other /same  Imaging studies, results of polysomnography and / or neurophysiology testing and pre-existing records as far as provided in visit., my assessment is   1)confirmed by NCV and EMG is his neuropathy  with pain, Pin and needles, cramping and burning. Now clearly with associated skin dystrophy,skin is shiny and hairless- loss of DTR- all pulses palpable  can't feel vibration but temperature. RLS like - .this affects his sleep.  Neuropathy is ascending and axonal.     Montreal Cognitive Assessment  04/30/2018  Visuospatial/ Executive (0/5) 5  Naming (0/3) 3  Attention: Read list of digits (0/2) 2  Attention: Read list of letters (0/1) 1  Attention: Serial 7 subtraction starting at 100 (0/3) 3  Language: Repeat phrase (0/2) 2  Language : Fluency (0/1) 1  Abstraction (0/2) 2  Delayed Recall (0/5) 0  Orientation (0/6) 6  Total 25    The patient was advised of the nature of the diagnosed disorder , the treatment options and the  risks for general health and wellness arising from not treating the condition.   I spent more than 25 minutes of face to face time with the patient.  Greater than 50% of time was spent in counseling and coordination of care. We have discussed the diagnosis and differential and I answered the patient's questions.    Plan:  Treatment plan and additional workup :  Wellbutrin in AM po. Lets add low dose Requip for RLS like creepy crawly sensation-  nocturnal pain,  Axonal Neuropathy- more stiffness. Its advancing- ascending. EMLA cream ordered.    MOCA next time again.  RV prn, 6 month  Epworth and Baker, MD 0/51/1021, 11:73 AM  Certified in Neurology by ABPN Certified in [place] by [facility] [address] [place], [place] [address]

## 2020-05-19 ENCOUNTER — Ambulatory Visit
Admission: RE | Admit: 2020-05-19 | Discharge: 2020-05-19 | Disposition: A | Discharge status: Home / Self Care | Payer: Medicare Other | Source: Ambulatory Visit | Attending: Internal Medicine | Admitting: Internal Medicine

## 2020-05-19 ENCOUNTER — Other Ambulatory Visit: Payer: Self-pay

## 2020-05-19 ENCOUNTER — Other Ambulatory Visit: Payer: Self-pay | Admitting: Internal Medicine

## 2020-05-19 DIAGNOSIS — R079 Chest pain, unspecified: Secondary | ICD-10-CM

## 2020-05-19 DIAGNOSIS — R0789 Other chest pain: Secondary | ICD-10-CM | POA: Diagnosis not present

## 2020-05-19 DIAGNOSIS — Z8572 Personal history of non-Hodgkin lymphomas: Secondary | ICD-10-CM | POA: Diagnosis not present

## 2020-05-19 DIAGNOSIS — Z85850 Personal history of malignant neoplasm of thyroid: Secondary | ICD-10-CM | POA: Diagnosis not present

## 2020-05-22 ENCOUNTER — Inpatient Hospital Stay: Payer: Medicare Other

## 2020-05-22 ENCOUNTER — Telehealth: Payer: Self-pay | Admitting: Hematology and Oncology

## 2020-05-22 ENCOUNTER — Inpatient Hospital Stay: Payer: Medicare Other | Attending: Hematology and Oncology | Admitting: Hematology and Oncology

## 2020-05-22 ENCOUNTER — Other Ambulatory Visit: Payer: Self-pay

## 2020-05-22 ENCOUNTER — Encounter: Payer: Self-pay | Admitting: Hematology and Oncology

## 2020-05-22 VITALS — BP 132/79 | HR 72 | Temp 97.5°F | Resp 18 | Ht 73.0 in | Wt 217.6 lb

## 2020-05-22 DIAGNOSIS — C73 Malignant neoplasm of thyroid gland: Secondary | ICD-10-CM | POA: Diagnosis not present

## 2020-05-22 DIAGNOSIS — Z85850 Personal history of malignant neoplasm of thyroid: Secondary | ICD-10-CM | POA: Diagnosis not present

## 2020-05-22 DIAGNOSIS — Z79899 Other long term (current) drug therapy: Secondary | ICD-10-CM | POA: Diagnosis not present

## 2020-05-22 DIAGNOSIS — Z452 Encounter for adjustment and management of vascular access device: Secondary | ICD-10-CM | POA: Diagnosis not present

## 2020-05-22 DIAGNOSIS — M542 Cervicalgia: Secondary | ICD-10-CM | POA: Diagnosis not present

## 2020-05-22 DIAGNOSIS — N400 Enlarged prostate without lower urinary tract symptoms: Secondary | ICD-10-CM | POA: Diagnosis not present

## 2020-05-22 DIAGNOSIS — C8318 Mantle cell lymphoma, lymph nodes of multiple sites: Secondary | ICD-10-CM

## 2020-05-22 LAB — CMP (CANCER CENTER ONLY)
ALT: 16 U/L (ref 0–44)
AST: 16 U/L (ref 15–41)
Albumin: 3.8 g/dL (ref 3.5–5.0)
Alkaline Phosphatase: 62 U/L (ref 38–126)
Anion gap: 7 (ref 5–15)
BUN: 20 mg/dL (ref 8–23)
CO2: 25 mmol/L (ref 22–32)
Calcium: 8.8 mg/dL — ABNORMAL LOW (ref 8.9–10.3)
Chloride: 107 mmol/L (ref 98–111)
Creatinine: 1.21 mg/dL (ref 0.61–1.24)
GFR, Estimated: >60 mL/min (ref >60)
Glucose, Bld: 112 mg/dL — ABNORMAL HIGH (ref 70–99)
Potassium: 4.3 mmol/L (ref 3.5–5.1)
Sodium: 139 mmol/L (ref 135–145)
Total Bilirubin: 1.4 mg/dL — ABNORMAL HIGH (ref 0.3–1.2)
Total Protein: 6 g/dL — ABNORMAL LOW (ref 6.5–8.1)

## 2020-05-22 LAB — CBC WITH DIFFERENTIAL (CANCER CENTER ONLY)
Abs Immature Granulocytes: 0.01 10*3/uL (ref 0.00–0.07)
Basophils Absolute: 0 10*3/uL (ref 0.0–0.1)
Basophils Relative: 1 %
Eosinophils Absolute: 0.1 10*3/uL (ref 0.0–0.5)
Eosinophils Relative: 3 %
HCT: 38.7 % — ABNORMAL LOW (ref 39.0–52.0)
Hemoglobin: 12.8 g/dL — ABNORMAL LOW (ref 13.0–17.0)
Immature Granulocytes: 0 %
Lymphocytes Relative: 13 %
Lymphs Abs: 0.6 10*3/uL — ABNORMAL LOW (ref 0.7–4.0)
MCH: 30.6 pg (ref 26.0–34.0)
MCHC: 33.1 g/dL (ref 30.0–36.0)
MCV: 92.6 fL (ref 80.0–100.0)
Monocytes Absolute: 0.6 10*3/uL (ref 0.1–1.0)
Monocytes Relative: 13 %
Neutro Abs: 3.1 10*3/uL (ref 1.7–7.7)
Neutrophils Relative %: 70 %
Platelet Count: 151 10*3/uL (ref 150–400)
RBC: 4.18 MIL/uL — ABNORMAL LOW (ref 4.22–5.81)
RDW: 12.8 % (ref 11.5–15.5)
WBC Count: 4.4 10*3/uL (ref 4.0–10.5)
nRBC: 0 % (ref 0.0–0.2)

## 2020-05-22 MED ORDER — HEPARIN SOD (PORK) LOCK FLUSH 100 UNIT/ML IV SOLN
500.0000 [IU] | Freq: Once | INTRAVENOUS | Status: AC
  Administered 2020-05-22: 500 [IU]
  Filled 2020-05-22: qty 5

## 2020-05-22 MED ORDER — SODIUM CHLORIDE 0.9% FLUSH
10.0000 mL | Freq: Once | INTRAVENOUS | Status: AC
  Administered 2020-05-22: 10 mL
  Filled 2020-05-22: qty 10

## 2020-05-22 NOTE — Assessment & Plan Note (Signed)
He has treated thyroid cancer I will order CT imaging of his neck for further follow-up

## 2020-05-22 NOTE — Telephone Encounter (Signed)
Scheduled appts per 2/28 sch msg. Gave pt a print out of AVS.

## 2020-05-22 NOTE — Patient Instructions (Signed)

## 2020-05-22 NOTE — Assessment & Plan Note (Signed)
He has slight lymphedema on exam I recommend the patient to continue on neck exercises to prevent stiffness

## 2020-05-22 NOTE — Progress Notes (Signed)
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 CT CHEST ABDOMEN PELVIS W CONTRAST    Standing Status:   Future    Standing Expiration Date:   05/22/2021    Order Specific Question:   Preferred imaging location?    Answer:   [facility]    Order Specific Question:   Radiology Contrast Protocol - do NOT remove file path    Answer:   [email or web address]  . CT SOFT TISSUE NECK W CONTRAST    Standing Status:   Future    Standing Expiration Date:   05/22/2021    Order Specific Question:   If indicated for the ordered procedure, I authorize the administration of contrast media per Radiology protocol    Answer:   Yes    Order Specific Question:   Preferred imaging location?    Answer:   [facility]    Order Specific Question:   Radiology Contrast Protocol - do NOT remove file path    Answer:   [email or web address]    All questions were answered. The patient knows to call the clinic with any problems, questions or concerns. The total time spent in the appointment was 20 minutes encounter with patients including review of chart and various tests results, discussions about plan of  care and coordination of care plan   Artis Delay, MD 05/22/2020 2:50 PM  INTERVAL HISTORY: Please see below for problem oriented charting. Returns with his wife for further follow-up He complained of slight neck discomfort since surgery He also have new onset of left pleuritic type chest wall pain for the past month He had chest x-ray done which came back unremarkable No new lymphadenopathy He continues to have peripheral neuropathy, stable  SUMMARY OF ONCOLOGIC HISTORY: Oncology History Overview Note  MIPI score 7.5 high risk   Mantle cell lymphoma (HCC)  06/13/2017 Imaging   Ct imaging at [facility] 1. 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 2. 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 3. Slight reduction in size of right retroperitoneal evolving hematoma.   03/30/2018 Imaging   Ct neck 1. Extensive adenopathy on the left. The largest node is a level 1/submandibular node measuring 38 x 24 x 22 mm. Largest level 2 node measures 3.2 x 2 x 2 cm. Numerous other smaller but round lymph nodes throughout the left neck in the level 2 through level 4 region. The largest level 5 node measures 3.4 x 3.4 x 2.3 cm with a transverse diameter of 2.3 cm. This contains some internal calcifications. There are numerous other pathologic nodes in the left supraclavicular to axillary region. This pattern of disease could be due  to extensive left neck metastatic disease from unknown primary, lymphoma, or other systemic malignancy. 2. No evidence of mucosal or submucosal lesion. 3. Diameter of the ascending aorta is 4.5 cm. Recommend annual imaging followup by CTA or MRA. Aortic Atherosclerosis (ICD10-I70.0).    04/10/2018 Pathology Results   Left zone 1 neck mass, Fine Needle Aspiration I (smears and cell block): Atypical lymphoid  proliferation. See comment.  Specimen Adequacy: Satisfactory for evaluation.  COMMENT:The aspirate demonstrates abundant small to medium sized lymphocytes with scattered epithelioid cells in the background which may be histiocytes. The smears are cellular, [name], the findings indicate an atypical lymphoid proliferation but are too limited for definitive diagnosis. Excisional biopsy with flow cytometry is recommended.   04/10/2018 Procedure   He was ENT who performed FNA of neck LN   04/27/2018 Pathology Results   Lymph node for lymphoma, [name], [name], [name], [name], [name], [name], [name], the features are consistent with a mantle cell lymphoma   05/13/2018 PET scan   1. Hypermetabolic left cervical lymphadenopathy, left axillary lymphadenopathy, mediastinal / right hilar lymphadenopathy, right external iliac lymphadenopathy, and subcentimeter left external iliac and left inguinal lymph nodes, [name], mildly increased FDG activity in the  spleen, [name], without focal lesions, that may represent lymphomatous involvement of the spleen.   05/21/2018 Cancer Staging   Staging form: Hodgkin and Non-Hodgkin Lymphoma, AJCC 8th Edition - Clinical: Stage III - Signed by Heath Lark, MD on 05/21/2018   05/26/2018 Procedure   Ultrasound and fluoroscopically guided right internal jugular single lumen power port catheter insertion. Tip in the SVC/RA junction. Catheter ready for use.    06/01/2018 -  Chemotherapy   The patient had Bendamustine and Rituximab for chemotherapy treatment   08/24/2018 PET scan   1. Partial response to therapy with complete resolution of size and metabolic activity of the dominant LEFT submandibular node (level 1) and LEFT axillary nodes. 2. Persistent and increased metabolic activity of left level 3 lymph nodes ( Deauville 4). Nodes are partially calcified and similar size. 3. No new metastatic disease. 4. Normal spleen and bone marrow.   11/09/2018 PET scan   IMPRESSION: 1. Mild response to therapy of hypermetabolic left cervical nodes. (Deauville) 4. 2. No new or progressive disease. 3. Coronary artery atherosclerosis. Aortic Atherosclerosis (ICD10-I70.0). 4. Trace cul-de-sac fluid, similar.   12/18/2018 Imaging   1. 4 mm solid nodule in the right lower lobe, which is technically indeterminate. Recommend attention on follow-up per clinical protocol. 2. Small calcified nodule adjacent to the left lobe of the thyroid, which may represent thyroid nodule or calcified lymph node. Recommend correlation with thyroid ultrasound. 3. The ascending aorta appears mildly dilated, measuring  up to 4.2cm. 4. Decreased left axillary lymphadenopathy.   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   Stranding with postsurgical changes along the right anterior neck anterior to the thyroid  cartilage, favored to be related to recent surgery. Attention on follow-up is suggested.   No suspicious lymphadenopathy in the neck, chest, abdomen, or pelvis. Deauville criteria 1   11/22/2019 Imaging   1. No evidence of recurrent lymphoma in the chest, abdomen or pelvis. Normal size spleen. 2. 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 Recommend annual imaging followup by CTA or MRA. This recommendation follows 2010 CCF/AHA/AATS/ACR/ASA/SCA/SCAI/SIR/STS/SVM Guidelines for the Diagnosis and Management of Patients with Thoracic Aortic Disease. Circulation. 2010; 121: V956-L875. Aortic aneurysm NOS (ICD10-I71.9). Moderate diffuse colonic diverticulosis. Mild prostatomegaly. Aortic Atherosclerosis (ICD10-I70.0).   Thyroid cancer ([place])  11/23/2018 Pathology Results   Lymph node for lymphoma, Left zone 3 - PAPILLARY THYROID CARCINOMA. - SEE MICROSCOPIC DESCRIPTION. Microscopic Comment The specimen consists mostly of encapsulated papillary thyroid carcinoma. There are portions of lymphoid tissue attached to the periphery of the specimen and in the adjacent adipose tissue which suggests that this may represent papillary carcinoma metastatic to a lymph node.   11/23/2018 Surgery   PREOPERATIVE DIAGNOSIS:  Mantle cell lymphoma and cervical lymphadenopathy.   POSTOPERATIVE DIAGNOSIS:  Mantle cell lymphoma and cervical lymphadenopathy.   PROCEDURE:  Excisional biopsy of left cervical lymph nodes.     01/15/2019 Pathology Results   A. SOFT TISSUE, NECK, ADJACENT TO LEFT RECURRENT NERVE, BIOPSY: - Papillary thyroid carcinoma. B. 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 C. ADDITIONAL LEFT LEVEL 4 TISSUE, EXCISION: - There is no evidence of carcinoma in 2 of 2 lymph nodes (0/2). D. TOTAL THYROIDECTOMY: - Papillary thyroid carcinoma, 0.8 cm. - Carcinoma is broadly present at an inked tissue edge. -  See oncology table below. E. LEFT LEVEL 6 TISSUE, EXCISION: - Benign fibroadipose tissue. - There is no evidence of malignancy. F. LEFT RECURRENT NERVE AND SURROUNDING TISSUE, EXCISION: - Metastatic papillary thyroid carcinoma in 3 of 3 lymph nodes (3/3).  THYROID GLAND: Procedure: Thyroidectomy Tumor Focality: Unifocal Tumor Site: Left lobe Tumor Size: 0.8 cm Histologic Type: 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Margins: Carcinoma is broadly present at a black inked tissue edge. 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   PROCEDURE:  Total thyroidectomy and left modified radical neck dissection.   SURGEON:  Melida Quitter, MD     FINDINGS:  There was a firm nodule in zone IIB on the left side and also some firm tissue and adherence to the jugular vein in zone III region requiring removal of a portion of the vein.  There was a firm nodule in the left thyroid lobe that extended posterior to the gland, adhering to the trachea, and encompassing the recurrent laryngeal nerve.  Tissue was sent for frozen section from adjacent  to the nerve, demonstrating papillary carcinoma.  The nerve stimulated distal to the mass but not proximal to the mass; thus, the nerve was removed with the mass.  The left-sided parathyroid glands were visualized, as was the right superior parathyroid gland.  The right recurrent nerve was kept  safe and stimulated well at the end of the case.     REVIEW OF SYSTEMS:   Constitutional: Denies fevers, chills or abnormal weight loss Eyes: Denies blurriness of vision Ears, nose, mouth, throat, and face: Denies mucositis or sore throat Respiratory: Denies cough, dyspnea or wheezes Cardiovascular: Denies palpitation, chest discomfort or lower extremity swelling Gastrointestinal:  Denies nausea, heartburn or change in bowel habits Skin: Denies abnormal skin rashes Lymphatics: Denies new lymphadenopathy or easy bruising Neurological:Denies numbness, tingling or new weaknesses Behavioral/Psych: Mood is stable, no new changes  All other systems were reviewed with the patient and are negative.  I have reviewed the past medical history, past surgical history, social history and family history with the patient and they are unchanged from previous note.  ALLERGIES:  is allergic to aspirin, levaquin [levofloxacin in d5w], nickel, other, statins, allopurinol, and ampicillin.  MEDICATIONS:  Current Outpatient Medications  Medication Sig Dispense Refill  . acetaminophen (TYLENOL) 500 MG tablet Take 1,000 mg by mouth daily as needed for moderate pain.    Marland Kitchen alfuzosin (UROXATRAL) 10 MG 24 hr tablet Take 10 mg by mouth at bedtime.     Marland Kitchen buPROPion (WELLBUTRIN XL) 150 MG 24 hr tablet Take 1 tablet (150 mg total) by mouth daily. 90 tablet 3  . Cholecalciferol (VITAMIN D) 2000 units tablet Take 2,000 Units by mouth at bedtime.     Marland Kitchen ezetimibe (ZETIA) 10 MG tablet Take 10 mg by mouth at bedtime.    . finasteride (PROSCAR) 5 MG tablet Take 5 mg by mouth at bedtime.     Marland Kitchen levothyroxine (SYNTHROID) 175 MCG tablet Take 1 tablet (175 mcg total) by mouth daily.    Marland Kitchen lidocaine-prilocaine (EMLA) cream Apply to affected area once before sleep time- 30 g 3  . metoprolol succinate (TOPROL-XL) 25 MG 24 hr tablet Take 12.5 mg by mouth daily.     Vladimir Faster Glycol-Propyl Glycol (SYSTANE HYDRATION PF OP) Place 1  drop into both eyes 3 (three) times daily as needed (dry eyes).     Marland Kitchen rOPINIRole (REQUIP) 0.25 MG tablet Take 1 tablet (0.25 mg total) by mouth at bedtime. 30 tablet 5   No current facility-administered medications for this visit.    PHYSICAL EXAMINATION: ECOG PERFORMANCE STATUS: 1 - Symptomatic but completely ambulatory  Vitals:   05/22/20 0958  BP: 132/79  Pulse: 72  Resp: 18  Temp: (!) 97.5 F (36.4 C)  SpO2: 98%   Filed Weights   05/22/20 0958  Weight: 217 lb 9.6 oz (98.7 kg)    GENERAL:alert, no distress and comfortable SKIN: skin color, texture, turgor are normal, no rashes or significant lesions EYES: normal, Conjunctiva are pink and non-injected, sclera clear OROPHARYNX:no exudate, no erythema and lips, buccal mucosa, and tongue normal  NECK: He has slight lymphedema near his neck LYMPH:  no palpable lymphadenopathy in the cervical, axillary or inguinal LUNGS: clear to auscultation and percussion with normal breathing effort HEART: regular rate & rhythm and no murmurs and no lower extremity edema ABDOMEN:abdomen soft, non-tender and normal bowel sounds Musculoskeletal:no cyanosis of digits and no clubbing  NEURO: alert & oriented x 3 with fluent speech, no focal motor/sensory deficits  LABORATORY DATA:  I have reviewed the data as listed    Component Value Date/Time   NA 139 05/22/2020 0941   K 4.3 05/22/2020 0941   CL 107 05/22/2020 0941   CO2 25 05/22/2020 0941   GLUCOSE 112 (H) 05/22/2020 0941   BUN 20 05/22/2020 0941   CREATININE 1.21 05/22/2020 0941   CALCIUM 8.8 (L) 05/22/2020 0941   PROT 6.0 (L) 05/22/2020 0941   PROT 6.6 07/23/2017 1504   ALBUMIN 3.8 05/22/2020 0941   AST 16 05/22/2020 0941   ALT 16 05/22/2020 0941   ALKPHOS 62 05/22/2020 0941   BILITOT 1.4 (H) 05/22/2020 0941   GFRNONAA >60 05/22/2020 0941   GFRAA >60 11/22/2019 1023    No results found for: SPEP, UPEP  Lab Results  Component Value Date   WBC 4.4 05/22/2020   NEUTROABS  3.1 05/22/2020   HGB 12.8 (L) 05/22/2020   HCT 38.7 (L) 05/22/2020   MCV 92.6 05/22/2020   PLT 151 05/22/2020      Chemistry      Component Value Date/Time   NA 139 05/22/2020 0941   K 4.3 05/22/2020 0941   CL 107 05/22/2020 0941   CO2 25 05/22/2020 0941   BUN 20 05/22/2020 0941   CREATININE 1.21 05/22/2020 0941      Component Value Date/Time   CALCIUM 8.8 (L) 05/22/2020 0941   ALKPHOS 62 05/22/2020 0941   AST 16 05/22/2020 0941   ALT 16 05/22/2020 0941   BILITOT 1.4 (H) 05/22/2020 0941       RADIOGRAPHIC STUDIES: I have personally reviewed the radiological images as listed and agreed with the findings in the report. DG Ribs Unilateral W/Chest Left  Result Date: 05/21/2020 CLINICAL DATA:  Left-sided chest wall pain/rib pain several months. No injury. History of mantle cell lymphoma and thyroid cancer. EXAM: LEFT RIBS AND CHEST - 3+ VIEW COMPARISON:  03/24/2019 FINDINGS: Right IJ Port-A-Cath unchanged. Lungs are adequately inflated and otherwise clear. Cardiomediastinal silhouette is unremarkable. No evidence of focal left rib fracture or abnormality. Degenerative changes of the spine. IMPRESSION: 1. No acute findings. 2. Right IJ Port-A-Cath unchanged. Electronically Signed   By: Marin Olp M.D.   On: 05/21/2020 09:38

## 2020-05-22 NOTE — Assessment & Plan Note (Signed)
Clinically, he has no signs of cancer recurrence However, due to his pleuritic type chest pain and discomfort, I think is reasonable to order repeat CT imaging to rule out cancer recurrence

## 2020-05-23 ENCOUNTER — Telehealth: Payer: Self-pay

## 2020-05-23 ENCOUNTER — Telehealth: Payer: Self-pay | Admitting: Hematology and Oncology

## 2020-05-23 NOTE — Telephone Encounter (Signed)
RN spoke with wife, they will be calling to set up today.  Confirmed scheduling number with patient's wife.  No further needs.

## 2020-05-23 NOTE — Telephone Encounter (Signed)
Called pt per 3/1 sch msg - no answer. Left message for pt to call back to reschedule appt.

## 2020-05-23 NOTE — Telephone Encounter (Signed)
-----   Message from Heath Lark, MD sent at 05/23/2020  8:14 AM EST ----- Regarding: can you call and remind him to call and schedule CT

## 2020-05-24 ENCOUNTER — Telehealth: Payer: Self-pay

## 2020-05-24 NOTE — Telephone Encounter (Signed)
Wife called and left a message to move appt to 3/10 with Dr. Alvy Bimler. Called back and left message .Appt moved to 3/10 at 1040. Ask him to call the office back if needed.

## 2020-05-30 ENCOUNTER — Other Ambulatory Visit: Payer: Self-pay

## 2020-05-30 ENCOUNTER — Ambulatory Visit (HOSPITAL_COMMUNITY)
Admission: RE | Admit: 2020-05-30 | Discharge: 2020-05-30 | Disposition: A | Discharge status: Home / Self Care | Payer: Medicare Other | Source: Ambulatory Visit | Attending: Hematology and Oncology | Admitting: Hematology and Oncology

## 2020-05-30 ENCOUNTER — Inpatient Hospital Stay: Payer: Medicare Other | Attending: Hematology and Oncology

## 2020-05-30 ENCOUNTER — Encounter (HOSPITAL_COMMUNITY): Payer: Self-pay

## 2020-05-30 DIAGNOSIS — N200 Calculus of kidney: Secondary | ICD-10-CM | POA: Diagnosis not present

## 2020-05-30 DIAGNOSIS — C8318 Mantle cell lymphoma, lymph nodes of multiple sites: Secondary | ICD-10-CM

## 2020-05-30 DIAGNOSIS — I712 Thoracic aortic aneurysm, without rupture: Secondary | ICD-10-CM | POA: Insufficient documentation

## 2020-05-30 DIAGNOSIS — E890 Postprocedural hypothyroidism: Secondary | ICD-10-CM | POA: Diagnosis not present

## 2020-05-30 DIAGNOSIS — C73 Malignant neoplasm of thyroid gland: Secondary | ICD-10-CM | POA: Insufficient documentation

## 2020-05-30 DIAGNOSIS — D1779 Benign lipomatous neoplasm of other sites: Secondary | ICD-10-CM | POA: Diagnosis not present

## 2020-05-30 DIAGNOSIS — Z8572 Personal history of non-Hodgkin lymphomas: Secondary | ICD-10-CM | POA: Diagnosis not present

## 2020-05-30 DIAGNOSIS — D171 Benign lipomatous neoplasm of skin and subcutaneous tissue of trunk: Secondary | ICD-10-CM | POA: Diagnosis not present

## 2020-05-30 DIAGNOSIS — Z79899 Other long term (current) drug therapy: Secondary | ICD-10-CM | POA: Insufficient documentation

## 2020-05-30 DIAGNOSIS — Z452 Encounter for adjustment and management of vascular access device: Secondary | ICD-10-CM | POA: Diagnosis not present

## 2020-05-30 DIAGNOSIS — C8313 Mantle cell lymphoma, intra-abdominal lymph nodes: Secondary | ICD-10-CM | POA: Diagnosis not present

## 2020-05-30 DIAGNOSIS — I2510 Atherosclerotic heart disease of native coronary artery without angina pectoris: Secondary | ICD-10-CM | POA: Diagnosis not present

## 2020-05-30 DIAGNOSIS — I6523 Occlusion and stenosis of bilateral carotid arteries: Secondary | ICD-10-CM | POA: Diagnosis not present

## 2020-05-30 DIAGNOSIS — N400 Enlarged prostate without lower urinary tract symptoms: Secondary | ICD-10-CM | POA: Diagnosis not present

## 2020-05-30 MED ORDER — HEPARIN SOD (PORK) LOCK FLUSH 100 UNIT/ML IV SOLN
500.0000 [IU] | Freq: Once | INTRAVENOUS | Status: AC
  Administered 2020-05-30: 500 [IU]
  Filled 2020-05-30: qty 5

## 2020-05-30 MED ORDER — SODIUM CHLORIDE 0.9% FLUSH
10.0000 mL | Freq: Once | INTRAVENOUS | Status: AC
  Administered 2020-05-30: 10 mL
  Filled 2020-05-30: qty 10

## 2020-05-30 MED ORDER — IOHEXOL 300 MG/ML  SOLN
100.0000 mL | Freq: Once | INTRAMUSCULAR | Status: AC | PRN
  Administered 2020-05-30: 100 mL via INTRAVENOUS

## 2020-05-31 ENCOUNTER — Telehealth: Payer: Self-pay | Admitting: Oncology

## 2020-05-31 ENCOUNTER — Telehealth: Payer: Medicare Other | Admitting: Hematology and Oncology

## 2020-05-31 NOTE — Telephone Encounter (Signed)
Called [name] (wife) and rescheduled appointment for tomorrow to 9:40.  They were not able to move the appointment to today at 3:00.

## 2020-06-01 ENCOUNTER — Inpatient Hospital Stay: Payer: Medicare Other | Admitting: Hematology and Oncology

## 2020-06-01 ENCOUNTER — Inpatient Hospital Stay (HOSPITAL_BASED_OUTPATIENT_CLINIC_OR_DEPARTMENT_OTHER): Payer: Medicare Other | Admitting: Hematology and Oncology

## 2020-06-01 ENCOUNTER — Encounter: Payer: Self-pay | Admitting: Hematology and Oncology

## 2020-06-01 DIAGNOSIS — C73 Malignant neoplasm of thyroid gland: Secondary | ICD-10-CM | POA: Diagnosis not present

## 2020-06-01 DIAGNOSIS — C8318 Mantle cell lymphoma, lymph nodes of multiple sites: Secondary | ICD-10-CM | POA: Diagnosis not present

## 2020-06-01 DIAGNOSIS — I7121 Aneurysm of the ascending aorta, without rupture: Secondary | ICD-10-CM

## 2020-06-01 DIAGNOSIS — I712 Thoracic aortic aneurysm, without rupture: Secondary | ICD-10-CM

## 2020-06-01 NOTE — Progress Notes (Signed)
HEMATOLOGY-ONCOLOGY ELECTRONIC VISIT PROGRESS NOTE  Patient Care Team: [name], MD as PCP - General (Internal Medicine) Blazing, Venia Carbon, MD as PCP - Cardiology (Cardiology) Festus Aloe, MD as Referring Physician (Hematology and Oncology)  I connected with by [facility] video conference and verified that I am speaking with the correct person using two identifiers.  I discussed the limitations, risks, security and privacy concerns of performing an evaluation and management service by EP[facility] and the availability of in person appointments.  I also discussed with the patient that there may be a patient responsible charge related to this service. The patient expressed understanding and agreed to proceed.   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   INTERVAL HISTORY: Please see below for problem oriented charting. 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 2. 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 3. Slight  reduction in size of right retroperitoneal evolving hematoma.   03/30/2018 Imaging   Ct neck 1. Extensive adenopathy on the left. The largest node is a level 1/submandibular node measuring 38 x 24 x 22 mm. Largest level 2 node measures 3.2 x 2 x 2 cm. Numerous other smaller but round lymph nodes throughout the left neck in the level 2 through level 4 region. The largest level 5 node measures 3.4 x 3.4 x 2.3 cm with a transverse diameter of 2.3 cm. This contains some internal calcifications. There are numerous other pathologic nodes in the left supraclavicular to axillary region. This pattern of disease could be due to extensive left neck metastatic disease from unknown primary, lymphoma, or other systemic malignancy. 2. No evidence of mucosal or submucosal lesion. 3. Diameter of the ascending aorta is 4.5 cm. Recommend annual imaging followup by CTA or MRA. Aortic Atherosclerosis (ICD10-I70.0).    04/10/2018 Pathology Results   Left zone 1 neck mass, [name], [name], the findings indicate an atypical lymphoid proliferation but are too limited for definitive diagnosis. Excisional biopsy with flow cytometry is recommended.   04/10/2018 Procedure   He was ENT who performed FNA of  neck LN   04/27/2018 Pathology Results   Lymph node for lymphoma, Left zone 2 Cervical - MANTLE CELL LYMPHOMA - SEE  COMMENT Microscopic Comment The biopsies have nodal architectural effacement by a monotonous lymphoid population. The lymphocytes are predominantly small in size with round nuclei and mature, [name], [name], [name], [name], [name], [name], the features are consistent with a mantle cell lymphoma   05/13/2018 PET scan   1. Hypermetabolic left cervical lymphadenopathy, left axillary lymphadenopathy, mediastinal / right hilar lymphadenopathy, right external iliac lymphadenopathy, and subcentimeter left external iliac and left inguinal lymph nodes, [name], [name], [name], without focal lesions, that may represent lymphomatous involvement of the spleen.   05/21/2018 Cancer Staging   Staging form: Hodgkin and Non-Hodgkin Lymphoma, AJCC 8th Edition - Clinical: Stage III - Signed by Heath Lark, MD on 05/21/2018   05/26/2018 Procedure   Ultrasound and fluoroscopically guided right internal jugular single lumen power port catheter insertion. Tip in the SVC/RA junction. Catheter ready for use.    06/01/2018 -  Chemotherapy   The patient had Bendamustine and Rituximab for chemotherapy treatment   08/24/2018 PET scan   1. Partial response to therapy with complete resolution of size and metabolic activity of the dominant LEFT submandibular node (level 1) and LEFT axillary nodes. 2. Persistent and increased metabolic activity of left level 3 lymph nodes ( [place] 4). Nodes are partially  calcified and similar size. 3. No new metastatic disease. 4. Normal spleen and bone marrow.   11/09/2018 PET scan   IMPRESSION: 1. Mild response to therapy of hypermetabolic left cervical nodes. ([place]) 4. 2. No new or progressive disease. 3. Coronary artery atherosclerosis. Aortic Atherosclerosis (ICD10-I70.0). 4. Trace cul-de-sac fluid, similar.   12/18/2018 Imaging   1. 4 mm solid nodule in the right lower lobe, which is technically indeterminate. Recommend attention on follow-up per clinical protocol. 2. Small calcified nodule adjacent to the left lobe of the thyroid, which may represent thyroid nodule or calcified lymph node. Recommend correlation with thyroid ultrasound. 3. The ascending aorta appears mildly dilated, measuring up to 4.2cm. 4. Decreased left axillary lymphadenopathy.   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   Stranding with postsurgical changes along the right anterior neck anterior to the thyroid cartilage, favored to be related to recent surgery. Attention on follow-up is suggested.   No suspicious lymphadenopathy in the neck, chest, abdomen, or pelvis. Deauville criteria 1   11/22/2019 Imaging   1. No evidence of recurrent lymphoma in the chest, abdomen or pelvis. Normal size spleen. 2. 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 Recommend annual imaging followup by CTA or MRA. This recommendation follows 2010 [facility]/AHA/AATS/ACR/ASA/SCA/SCAI/SIR/STS/SVM Guidelines for the Diagnosis and Management of Patients with Thoracic Aortic Disease. Circulation. 2010; 121: I778-E423. Aortic aneurysm NOS (ICD10-I71.9). Moderate diffuse colonic diverticulosis. Mild prostatomegaly. Aortic Atherosclerosis (ICD10-I70.0).   05/30/2020 Imaging   Stable exam. No evidence of recurrent lymphoma or metastatic disease within the chest, abdomen, or pelvis.  Colonic  diverticulosis, without radiographic evidence of diverticulitis.   Tiny nonobstructing left renal calculus.   Stable mildly enlarged prostate.   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   Aortic and coronary atherosclerotic calcification.   Thyroid cancer ([place])  11/23/2018 Pathology Results   Lymph node for lymphoma, Left zone 3 - PAPILLARY THYROID CARCINOMA. - SEE MICROSCOPIC DESCRIPTION. Microscopic Comment The specimen consists mostly of encapsulated papillary thyroid carcinoma. There are portions of lymphoid tissue attached to the periphery of the specimen and in the adjacent adipose tissue which suggests that this may represent papillary carcinoma metastatic to a lymph node.   11/23/2018 Surgery   PREOPERATIVE DIAGNOSIS:  Mantle cell lymphoma and cervical lymphadenopathy.   POSTOPERATIVE DIAGNOSIS:  Mantle cell lymphoma and cervical lymphadenopathy.   PROCEDURE:  Excisional biopsy of left cervical lymph nodes.     01/15/2019 Pathology Results   A. SOFT TISSUE, NECK, ADJACENT TO LEFT RECURRENT NERVE, BIOPSY: - Papillary thyroid carcinoma. B. 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 C. ADDITIONAL LEFT LEVEL 4 TISSUE, EXCISION: - There is no evidence of carcinoma in 2 of 2 lymph nodes (0/2). D. TOTAL THYROIDECTOMY: - Papillary thyroid carcinoma, 0.8 cm. - Carcinoma is broadly present at an inked tissue edge. - See oncology table below. E. LEFT LEVEL 6 TISSUE, EXCISION: - Benign fibroadipose tissue. - There is no evidence of malignancy. F. LEFT RECURRENT NERVE AND SURROUNDING TISSUE, EXCISION: - Metastatic papillary thyroid carcinoma in 3 of 3 lymph nodes (3/3).  THYROID GLAND: Procedure: Thyroidectomy Tumor Focality: Unifocal Tumor Site: Left lobe Tumor Size: 0.8 cm Histologic Type: 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Margins: Carcinoma is broadly present at a black inked tissue  edge. 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   PROCEDURE:  Total thyroidectomy and left modified radical neck dissection.   SURGEON:  Melida Quitter, MD     FINDINGS:  There was a firm nodule in zone IIB on the left side and also some firm tissue and adherence to the jugular vein in zone III region requiring removal of a portion of the vein.  There was a firm nodule in the left thyroid lobe that extended posterior to the gland, adhering to the trachea, and encompassing the recurrent laryngeal nerve.  Tissue was sent for frozen section from adjacent to the nerve, demonstrating papillary carcinoma.  The nerve stimulated distal to the mass but not proximal to the mass; thus, the nerve was removed with the mass.  The left-sided parathyroid glands were visualized, as was the right superior parathyroid gland.  The right recurrent nerve was kept safe and stimulated well at the end of the case.   05/30/2020 Imaging   Negative for recurrent mass or adenopathy in the neck   Left vocal cord paresis.     REVIEW OF SYSTEMS:   Constitutional: Denies fevers, chills or abnormal weight loss Eyes: Denies blurriness of vision Ears, nose, mouth, throat, and face: Denies mucositis or  sore throat Respiratory: Denies cough, dyspnea or wheezes Cardiovascular: Denies palpitation, chest discomfort Gastrointestinal:  Denies  nausea, heartburn or change in bowel habits Skin: Denies abnormal skin rashes Lymphatics: Denies new lymphadenopathy or easy bruising Neurological:Denies numbness, tingling or new weaknesses Behavioral/Psych: Mood is stable, no new changes  Extremities: No lower extremity edema All other systems were reviewed with the patient and are negative.  I have reviewed the past medical history, past surgical history, social history and family history with the patient and they are unchanged from previous note.  ALLERGIES:  is allergic to aspirin, levaquin [levofloxacin in d5w], nickel, other, statins, allopurinol, and ampicillin.  MEDICATIONS:  Current Outpatient Medications  Medication Sig Dispense Refill  . acetaminophen (TYLENOL) 500 MG tablet Take 1,000 mg by mouth daily as needed for moderate pain.    Marland Kitchen alfuzosin (UROXATRAL) 10 MG 24 hr tablet Take 10 mg by mouth at bedtime.     Marland Kitchen buPROPion (WELLBUTRIN XL) 150 MG 24 hr tablet Take 1 tablet (150 mg total) by mouth daily. 90 tablet 3  . Cholecalciferol (VITAMIN D) 2000 units tablet Take 2,000 Units by mouth at bedtime.     Marland Kitchen ezetimibe (ZETIA) 10 MG tablet Take 10 mg by mouth at bedtime.    . finasteride (PROSCAR) 5 MG tablet Take 5 mg by mouth at bedtime.     Marland Kitchen levothyroxine (SYNTHROID) 175 MCG tablet Take 1 tablet (175 mcg total) by mouth daily.    Marland Kitchen lidocaine-prilocaine (EMLA) cream Apply to affected area once before sleep time- 30 g 3  . metoprolol succinate (TOPROL-XL) 25 MG 24 hr tablet Take 12.5 mg by mouth daily.     Vladimir Faster Glycol-Propyl Glycol (SYSTANE HYDRATION PF OP) Place 1 drop into both eyes 3 (three) times daily as needed (dry eyes).     Marland Kitchen rOPINIRole (REQUIP) 0.25 MG tablet Take 1 tablet (0.25 mg total) by mouth at bedtime. 30 tablet 5   No current facility-administered medications for this visit.    PHYSICAL EXAMINATION: ECOG PERFORMANCE STATUS: 0 - Asymptomatic  LABORATORY DATA:  I have reviewed the data as listed CMP  Latest Ref Rng & Units 05/22/2020 11/22/2019 06/07/2019  Glucose 70 - 99 mg/dL 112(H) 87 104(H)  BUN 8 - 23 mg/dL 20 21 24(H)  Creatinine 0.61 - 1.24 mg/dL 1.21 1.22 1.30(H)  Sodium 135 - 145 mmol/L 139 138 143  Potassium 3.5 - 5.1 mmol/L 4.3 4.5 3.9  Chloride 98 - 111 mmol/L 107 103 108  CO2 22 - 32 mmol/L $RemoveB'25 28 25  'hYtYLZaw$ Calcium 8.9 - 10.3 mg/dL 8.8(L) 9.9 8.8(L)  Total Protein 6.5 - 8.1 g/dL 6.0(L) 6.6 6.3(L)  Total Bilirubin 0.3 - 1.2 mg/dL 1.4(H) 1.0 1.2  Alkaline Phos 38 - 126 U/L 62 71 58  AST 15 - 41 U/L $Remo'16 19 17  'QETTr$ ALT 0 - 44 U/L $Remo'16 22 16    'DzQCS$ Lab Results  Component Value Date   WBC 4.4 05/22/2020   HGB 12.8 (L) 05/22/2020   HCT 38.7 (L) 05/22/2020   MCV 92.6 05/22/2020   PLT 151 05/22/2020   NEUTROABS 3.1 05/22/2020     RADIOGRAPHIC STUDIES: I attempted to share my screen with the patient but he was not able to see the pictures.  I reviewed the imaging myself I have personally reviewed the radiological images as listed and agreed with the findings in the report. DG Ribs Unilateral W/Chest Left  Result Date: 05/21/2020 CLINICAL DATA:  Left-sided chest wall pain/rib pain several months. No  injury. History of mantle cell lymphoma and thyroid cancer. EXAM: LEFT RIBS AND CHEST - 3+ VIEW COMPARISON:  03/24/2019 FINDINGS: Right IJ Port-A-Cath unchanged. Lungs are adequately inflated and otherwise clear. Cardiomediastinal silhouette is unremarkable. No evidence of focal left rib fracture or abnormality. Degenerative changes of the spine. IMPRESSION: 1. No acute findings. 2. Right IJ Port-A-Cath unchanged. Electronically Signed   By: Marin Olp M.D.   On: 05/21/2020 09:38   CT SOFT TISSUE NECK W CONTRAST  Result Date: 05/30/2020 CLINICAL DATA:  History of lymphoma. History of thyroid cancer. Follow-up response to treatment. EXAM: CT NECK WITH CONTRAST TECHNIQUE: Multidetector CT imaging of the neck was performed using the standard protocol following the bolus administration of intravenous  contrast. CONTRAST:  150mL OMNIPAQUE IOHEXOL 300 MG/ML  SOLN COMPARISON:  CT neck 03/30/2018 FINDINGS: Pharynx and larynx: Negative for mass or edema in the pharynx. Asymmetric vocal folds. Asymmetric enlargement of the left piriform sinus and left laryngeal ventricle. Findings compatible with left vocal cord paresis. This was not present previously. Salivary glands: No inflammation, mass, or stone. Thyroid: Postop thyroidectomy. No thyroid tissue or mass in the thyroid bed is identified. Lymph nodes: Postop left neck dissection for metastatic thyroid cancer. Previously noted enlarged lymph nodes have resolved. There are postsurgical changes around the left sternocleidomastoid muscle with loss of fat plane between the carotid artery and sternocleidomastoid and scarring in the platysmas and surrounding soft tissues. Left submandibular lymph nodes seen on the prior study no longer present. Calcified left level 5 lymph node no longer identified. No recurrent adenopathy. No worrisome lymph nodes in the right neck. Vascular: Left jugular vein resected. Right jugular vein patent. Atherosclerotic calcification in the carotid artery bilaterally. Right jugular Port-A-Cath. Limited intracranial: Negative Visualized orbits: No orbital mass.  Bilateral cataract extraction Mastoids and visualized paranasal sinuses: Minimal mucosal edema paranasal sinuses. Skeleton: Cervical spondylosis.  No acute skeletal abnormality. Upper chest: Lung apices clear bilaterally Other: None IMPRESSION: Negative for recurrent mass or adenopathy in the neck Left vocal cord paresis. Electronically Signed   By: Franchot Gallo M.D.   On: 05/30/2020 08:37   CT CHEST ABDOMEN PELVIS W CONTRAST  Result Date: 05/30/2020 CLINICAL DATA:  Follow-up mantle cell lymphoma and thyroid carcinoma. EXAM: CT CHEST, ABDOMEN, AND PELVIS WITH CONTRAST TECHNIQUE: Multidetector CT imaging of the chest, abdomen and pelvis was performed following the standard protocol  during bolus administration of intravenous contrast. CONTRAST:  17mL OMNIPAQUE IOHEXOL 300 MG/ML  SOLN COMPARISON:  11/22/2019 FINDINGS: CT CHEST FINDINGS Cardiovascular: No acute findings. Aortic and coronary atherosclerotic calcification noted. 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 Mediastinum/Lymph Nodes: No masses or pathologically enlarged lymph nodes identified. Lungs/Pleura: No suspicious pulmonary nodules or masses identified. No evidence of infiltrate or pleural effusion. Small benign pleural lipoma in the right lateral pleural space remains stable. Musculoskeletal:  No suspicious bone lesions identified. CT ABDOMEN AND PELVIS FINDINGS Hepatobiliary: 1.5 cm hypervascular lesion in the left liver dome and sub-cm hypervascular focus in the posterior right liver dome remain stable and consistent with benign etiologies. No new or enlarging liver lesions identified. Prior cholecystectomy. No evidence of biliary obstruction. Pancreas:  No mass or inflammatory changes. Spleen:  Within normal limits in size and appearance. Adrenals/Urinary tract: 2 mm calculus again seen in upper pole left kidney. Small left upper pole renal cyst remains stable. No evidence of renal mass, ureteral calculi, or hydronephrosis. Stomach/Bowel: No evidence of obstruction or inflammatory process. Stable 3.0 x 1.4 cm fluid collection along the  posterior wall of the transverse duodenum (image 83/2). Normal appendix visualized. Mild colonic diverticulosis again noted, without evidence of diverticulitis. Vascular/Lymphatic: No pathologically enlarged lymph nodes identified. Embolization coil again seen in the region of the splenic hilum. No abdominal aortic aneurysm. Aortic atherosclerotic calcification noted. Reproductive:  Stable mildly enlarged prostate. Other:  None. Musculoskeletal:  No suspicious bone lesions identified. IMPRESSION: Stable exam. No evidence of recurrent lymphoma or metastatic disease within the chest,  abdomen, or pelvis. Colonic diverticulosis, without radiographic evidence of diverticulitis. Tiny nonobstructing left renal calculus. Stable mildly enlarged prostate. 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 Recommend annual imaging followup by CTA or MRA. This recommendation follows 2010 ACCF/AHA/AATS/ACR/ASA/SCA/SCAI/SIR/STS/SVM Guidelines for the Diagnosis and Management of Patients with Thoracic Aortic Disease. Circulation. 2010; 121: D471-E550. Aortic aneurysm NOS (ICD10-I71.9) Aortic and coronary atherosclerotic calcification. Electronically Signed   By: Marlaine Hind M.D.   On: 05/30/2020 11:50    I discussed the assessment and treatment plan with the patient. The patient was provided an opportunity to ask questions and all were answered. The patient agreed with the plan and demonstrated an understanding of the instructions. The patient was advised to call back or seek an in-person evaluation if the symptoms worsen or if the condition fails to improve as anticipated.    I spent 20 minutes for the appointment reviewing test results, discuss management and coordination of care.  Heath Lark, MD 06/01/2020 9:54 AM

## 2020-06-01 NOTE — Assessment & Plan Note (Signed)
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

## 2020-06-01 NOTE — Assessment & Plan Note (Signed)
Repeat CT imaging show no evidence of disease I recommend keeping his port for another 6 months before removing it I plan to see him in 6 months with history and physical examination and to keep CT imaging once a year

## 2020-06-01 NOTE — Assessment & Plan Note (Signed)
The aortic aneurysm is stable Continue monitoring and risk factor modification

## 2020-06-02 ENCOUNTER — Telehealth: Payer: Self-pay | Admitting: Hematology and Oncology

## 2020-06-02 NOTE — Telephone Encounter (Signed)
Scheduled appts per 3/10 sch msg. Spoke to patient who is aware of appts dates and times.

## 2020-06-12 DIAGNOSIS — N1832 Chronic kidney disease, stage 3b: Secondary | ICD-10-CM | POA: Diagnosis not present

## 2020-06-12 DIAGNOSIS — C8310 Mantle cell lymphoma, unspecified site: Secondary | ICD-10-CM | POA: Diagnosis not present

## 2020-06-12 DIAGNOSIS — I4892 Unspecified atrial flutter: Secondary | ICD-10-CM | POA: Diagnosis not present

## 2020-06-12 DIAGNOSIS — I429 Cardiomyopathy, unspecified: Secondary | ICD-10-CM | POA: Diagnosis not present

## 2020-06-12 DIAGNOSIS — I700 Atherosclerosis of aorta: Secondary | ICD-10-CM | POA: Diagnosis not present

## 2020-06-12 DIAGNOSIS — C73 Malignant neoplasm of thyroid gland: Secondary | ICD-10-CM | POA: Diagnosis not present

## 2020-06-12 DIAGNOSIS — I714 Abdominal aortic aneurysm, without rupture: Secondary | ICD-10-CM | POA: Diagnosis not present

## 2020-06-12 DIAGNOSIS — J3800 Paralysis of vocal cords and larynx, unspecified: Secondary | ICD-10-CM | POA: Diagnosis not present

## 2020-06-12 DIAGNOSIS — I2510 Atherosclerotic heart disease of native coronary artery without angina pectoris: Secondary | ICD-10-CM | POA: Diagnosis not present

## 2020-06-12 DIAGNOSIS — E890 Postprocedural hypothyroidism: Secondary | ICD-10-CM | POA: Diagnosis not present

## 2020-06-12 DIAGNOSIS — E785 Hyperlipidemia, unspecified: Secondary | ICD-10-CM | POA: Diagnosis not present

## 2020-06-12 DIAGNOSIS — G609 Hereditary and idiopathic neuropathy, unspecified: Secondary | ICD-10-CM | POA: Diagnosis not present

## 2020-06-19 DIAGNOSIS — H04123 Dry eye syndrome of bilateral lacrimal glands: Secondary | ICD-10-CM | POA: Diagnosis not present

## 2020-06-19 DIAGNOSIS — H40013 Open angle with borderline findings, low risk, bilateral: Secondary | ICD-10-CM | POA: Diagnosis not present

## 2020-07-14 ENCOUNTER — Encounter: Payer: Self-pay | Admitting: Hematology and Oncology

## 2020-07-25 ENCOUNTER — Other Ambulatory Visit: Payer: Self-pay

## 2020-07-25 ENCOUNTER — Inpatient Hospital Stay: Payer: Medicare Other | Attending: Hematology and Oncology

## 2020-07-25 DIAGNOSIS — Z452 Encounter for adjustment and management of vascular access device: Secondary | ICD-10-CM | POA: Diagnosis not present

## 2020-07-25 DIAGNOSIS — C8318 Mantle cell lymphoma, lymph nodes of multiple sites: Secondary | ICD-10-CM | POA: Insufficient documentation

## 2020-07-25 MED ORDER — SODIUM CHLORIDE 0.9% FLUSH
10.0000 mL | Freq: Once | INTRAVENOUS | Status: AC
  Administered 2020-07-25: 10 mL
  Filled 2020-07-25: qty 10

## 2020-07-25 MED ORDER — HEPARIN SOD (PORK) LOCK FLUSH 100 UNIT/ML IV SOLN
250.0000 [IU] | Freq: Once | INTRAVENOUS | Status: AC
Start: 2020-07-25 — End: 2020-07-25
  Administered 2020-07-25: 500 [IU]
  Filled 2020-07-25: qty 5

## 2020-07-25 NOTE — Patient Instructions (Signed)

## 2020-09-01 ENCOUNTER — Other Ambulatory Visit (HOSPITAL_BASED_OUTPATIENT_CLINIC_OR_DEPARTMENT_OTHER): Payer: Self-pay

## 2020-09-01 ENCOUNTER — Encounter: Payer: Self-pay | Admitting: Hematology and Oncology

## 2020-09-01 ENCOUNTER — Ambulatory Visit: Payer: Medicare Other | Attending: Internal Medicine

## 2020-09-01 ENCOUNTER — Other Ambulatory Visit: Payer: Self-pay

## 2020-09-01 DIAGNOSIS — Z23 Encounter for immunization: Secondary | ICD-10-CM

## 2020-09-01 MED ORDER — COVID-19 MRNA VACC (MODERNA) 100 MCG/0.5ML IM SUSP
INTRAMUSCULAR | 0 refills | Status: DC
  Filled 2020-09-01: qty 0.25, 1d supply, fill #0

## 2020-09-01 NOTE — Progress Notes (Signed)
   Covid-19 Vaccination Clinic  Name:  Sean Stanley    MRN: [id number] DOB: 01-27-1941  09/01/2020  [name] was observed post Covid-19 immunization for 15 minutes without incident. He was provided with Vaccine Information Sheet and instruction to access the V-Safe system.   [name] was instructed to call 911 with any severe reactions post vaccine: Difficulty breathing  Swelling of face and throat  A fast heartbeat  A bad rash all over body  Dizziness and weakness   Immunizations Administered     Name Date Dose VIS Date Route   Moderna Covid-19 Booster Vaccine 09/01/2020  2:04 PM 0.25 mL 01/12/2020 Intramuscular   Manufacturer: Moderna   Lot: [id number]   [place]: [id number]

## 2020-09-05 DIAGNOSIS — L509 Urticaria, unspecified: Secondary | ICD-10-CM | POA: Diagnosis not present

## 2020-09-05 DIAGNOSIS — W57XXXA Bitten or stung by nonvenomous insect and other nonvenomous arthropods, initial encounter: Secondary | ICD-10-CM | POA: Diagnosis not present

## 2020-09-19 ENCOUNTER — Encounter: Payer: Self-pay | Admitting: Hematology and Oncology

## 2020-09-19 ENCOUNTER — Inpatient Hospital Stay: Payer: Medicare Other | Attending: Hematology and Oncology

## 2020-09-19 ENCOUNTER — Other Ambulatory Visit: Payer: Self-pay

## 2020-09-19 DIAGNOSIS — C8318 Mantle cell lymphoma, lymph nodes of multiple sites: Secondary | ICD-10-CM | POA: Diagnosis not present

## 2020-09-19 LAB — CMP (CANCER CENTER ONLY)
ALT: 13 U/L (ref 0–44)
AST: 19 U/L (ref 15–41)
Albumin: 3.8 g/dL (ref 3.5–5.0)
Alkaline Phosphatase: 59 U/L (ref 38–126)
Anion gap: 7 (ref 5–15)
BUN: 24 mg/dL — ABNORMAL HIGH (ref 8–23)
CO2: 27 mmol/L (ref 22–32)
Calcium: 8.8 mg/dL — ABNORMAL LOW (ref 8.9–10.3)
Chloride: 106 mmol/L (ref 98–111)
Creatinine: 1.27 mg/dL — ABNORMAL HIGH (ref 0.61–1.24)
GFR, Estimated: 57 mL/min — ABNORMAL LOW (ref >60)
Glucose, Bld: 108 mg/dL — ABNORMAL HIGH (ref 70–99)
Potassium: 4.7 mmol/L (ref 3.5–5.1)
Sodium: 140 mmol/L (ref 135–145)
Total Bilirubin: 1.3 mg/dL — ABNORMAL HIGH (ref 0.3–1.2)
Total Protein: 6.4 g/dL — ABNORMAL LOW (ref 6.5–8.1)

## 2020-09-19 LAB — CBC WITH DIFFERENTIAL (CANCER CENTER ONLY)
Abs Immature Granulocytes: 0.03 10*3/uL (ref 0.00–0.07)
Basophils Absolute: 0 10*3/uL (ref 0.0–0.1)
Basophils Relative: 1 %
Eosinophils Absolute: 0.1 10*3/uL (ref 0.0–0.5)
Eosinophils Relative: 2 %
HCT: 37.9 % — ABNORMAL LOW (ref 39.0–52.0)
Hemoglobin: 12.8 g/dL — ABNORMAL LOW (ref 13.0–17.0)
Immature Granulocytes: 1 %
Lymphocytes Relative: 18 %
Lymphs Abs: 0.9 10*3/uL (ref 0.7–4.0)
MCH: 30.8 pg (ref 26.0–34.0)
MCHC: 33.8 g/dL (ref 30.0–36.0)
MCV: 91.3 fL (ref 80.0–100.0)
Monocytes Absolute: 0.6 10*3/uL (ref 0.1–1.0)
Monocytes Relative: 11 %
Neutro Abs: 3.3 10*3/uL (ref 1.7–7.7)
Neutrophils Relative %: 67 %
Platelet Count: 164 10*3/uL (ref 150–400)
RBC: 4.15 MIL/uL — ABNORMAL LOW (ref 4.22–5.81)
RDW: 12.9 % (ref 11.5–15.5)
WBC Count: 4.8 10*3/uL (ref 4.0–10.5)
nRBC: 0 % (ref 0.0–0.2)

## 2020-09-19 MED ORDER — HEPARIN SOD (PORK) LOCK FLUSH 100 UNIT/ML IV SOLN
250.0000 [IU] | Freq: Once | INTRAVENOUS | Status: AC
  Administered 2020-09-19: 500 [IU]
  Filled 2020-09-19: qty 5

## 2020-09-19 MED ORDER — SODIUM CHLORIDE 0.9% FLUSH
10.0000 mL | Freq: Once | INTRAVENOUS | Status: AC
  Administered 2020-09-19: 10 mL
  Filled 2020-09-19: qty 10

## 2020-09-22 ENCOUNTER — Encounter: Payer: Self-pay | Admitting: Hematology and Oncology

## 2020-10-09 NOTE — Progress Notes (Addendum)
Cardiology Office Note:    Date:  10/11/2020   ID:  Se[facility], DOB 07-27-1940, MRN [id number]  PCP:  Sean Huddle, MD   [facility] Providers Cardiologist:  Sean Patella, MD {   Referring MD: Sean Huddle, MD     History of Present Illness:    Sean [facility] is a 80 y.o. male with a very complicated cardiac and oncology history who was previously followed by Dr. Meda [facility] who now presents to clinic for follow-up.  The patient is a [place] [employer] veteran, he has been followed by Dr. Edwin [facility], cardiology is at [facility].  The patient underwent minimally invasive mitral valve repair in October 2002 at [facility] and has been followed there since then.  He was briefly seen at [facility] for episode of atrial flutter followed by cardioversion in 2014.  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 In May 2015 he underwent atrial flutter ablation.   He has also been followed by Dr. 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 He is currently in remission for both of his cancers. He has residual vocal cord dysfunction.  During last visit with Dr. Meda [facility] on 03/27/20, the patient was stable from CV standpoint. He underwent TTE at [facility] 10/2019 that showed mild left ventricle dysfunction with LVEF of 45 to 50%, global longitudinal strain -12.8%, moderate concentric left ventricular hypertrophy, normal right ventricular systolic function, status post mitral valve repair with prosthetic mitral valve ring in place, with mild residual mitral regurgitation, moderately dilated left atrium, trivial tricuspid regurgitation, normal size of the right atrium and normal right-sided pressures.  Today, the  patient feels overall well. Has been having low energy after being treated for his malignancies. Also underwent RAI in 2020 and has been maintained on levothyroxine. Has not been sleeping as well due to frequent urination during the middle of the night which disrupts his sleep. Plans to see Urology to treat further.  Otherwise, he is able to do acquatic exercises in the pool for 60minutes. No chest pain or SOB with exercise. Has peripheral neuropathy for which he takes tylenol. No cramping. Occasional palpitations, but no sustained events or known recurrence of Afib.   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   s/p coiling 12/16/12 - [facility]   Atrial flutter ([facility])    BPH (benign prostatic hyperplasia)    Cancer ([place])    melanoma on lower right back and left chest - surgically removed and cleared   Dysrhythmia    H/O agent Orange exposure    Headache    migraine- not current   High bilirubin    pt states it's genetic   History of blood transfusion    Hypercholesteremia    Hypercholesterolemia    Lymphoma ([place]) 04/2018   Mitral valve disease  annuloplasty 2002 [facility]   Neuropathy    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   OSA (obstructive sleep apnea)    does not use cpap, [facility] told him he had improved   Pneumonia    Thoracic ascending aortic aneurysm ([place])    4.5 cm 03/2018 CT   Thyroid cancer [facility])     Past Surgical History:  Procedure Laterality Date   ABDOMINAL ANGIOGRAM  08/05/12   aneurysm repair     CARDIOVERSION  02/12/2012    Procedure: CARDIOVERSION;  Surgeon: Sean Casino, MD;  Location: [place];  Service: Cardiovascular;  Laterality: N/A;   CARDIOVERSION N/A 06/22/2013   Procedure: CARDIOVERSION;  Surgeon: Sean Spark, MD;  Location: [place];  Service: Cardiovascular;  Laterality: N/A;   CATARACT EXTRACTION Bilateral 2018   with lens implant   CHOLECYSTECTOMY     COLONOSCOPY     ESOPHAGOGASTRODUODENOSCOPY     IR IMAGING GUIDED PORT INSERTION  05/26/2018   LYMPH NODE BIOPSY Left 04/27/2018   Procedure: EXCISIONAL BIOPSY OF LEFT CERVICAL LYMPH NODE;  Surgeon: Sean Quitter, MD;  Location: [place];  Service: ENT;  Laterality: Left;   LYMPH NODE BIOPSY Left 11/23/2018   Procedure: LEFT CERVICAL LYMPH NODE OPEN BIOPSY;  Surgeon: Sean Quitter, MD;  Location: [place];  Service: ENT;  Laterality: Left;   MENISCUS REPAIR Right 2009   MITRAL VALVE REPAIR  2002   [facility]   NM MYOVIEW LTD  07/22/2006   no ischemia   RADICAL NECK DISSECTION Left 01/15/2019   Procedure: LEFT NECK DISSECTION;  Surgeon: Sean Quitter, MD;  Location: [place];  Service: ENT;  Laterality: Left;   RIGHT HEART CATH  06/19/2004   normal right heart dynamics. EF 50%   TEE WITHOUT CARDIOVERSION  02/12/2012   Procedure: TRANSESOPHAGEAL ECHOCARDIOGRAM (TEE);  Surgeon: Sean Casino, MD;  Location: [facility];  Service: Cardiovascular;  Laterality: N/A;   TEE WITHOUT CARDIOVERSION N/A 06/22/2013   Procedure: TRANSESOPHAGEAL ECHOCARDIOGRAM (TEE);  Surgeon: Sean Spark, MD;  Location: [place];  Service: Cardiovascular;  Laterality: N/A;   THYROIDECTOMY N/A 01/15/2019   Procedure: TOTAL THYROIDECTOMY;  Surgeon: Sean Quitter, MD;  Location: [facility];  Service: ENT;  Laterality: N/A;    Current Medications: Current Meds  Medication Sig   acetaminophen (TYLENOL) 500 MG tablet Take 1,000 mg by mouth daily as needed for moderate pain.   alfuzosin (UROXATRAL) 10 MG 24 hr tablet Take 10 mg by mouth at bedtime.    buPROPion (WELLBUTRIN  XL) 150 MG 24 hr tablet Take 1 tablet (150 mg total) by mouth daily.   Cholecalciferol (VITAMIN D) 2000 units tablet Take 2,000 Units by mouth at bedtime.    ezetimibe (ZETIA) 10 MG tablet Take 10 mg by mouth at bedtime.   finasteride (PROSCAR) 5 MG tablet Take 5 mg by mouth at bedtime.    levothyroxine (SYNTHROID) 175 MCG tablet Take 1 tablet (175 mcg total) by mouth daily.   lidocaine-prilocaine (EMLA) cream Apply to affected area once before sleep time-   metoprolol succinate (TOPROL-XL) 25 MG 24 hr tablet Take 12.5 mg by mouth daily.    Polyethyl Glycol-Propyl Glycol (SYSTANE HYDRATION PF OP) Place 1 drop into both eyes 3 (three) times daily as needed (dry eyes).      Allergies:   Aspirin, Levaquin [levofloxacin in d5w], Nickel, Other, Statins, Allopurinol, and Ampicillin   Social History   Socioeconomic History  Marital status: Married    Spouse name: [name]   Number of children: 2   Years of education: Not on file   Highest education level: Not on file  Occupational History   Occupation: retired Hydrologist  Tobacco Use   Smoking status: Never   Smokeless tobacco: Never  Vaping Use   Vaping Use: Never used  Substance and Sexual Activity   Alcohol use: Not Currently    Comment: social   Drug use: No   Sexual activity: Not on file  Other Topics Concern   Not on file  Social History Narrative   Lives in [place], Retired   Science writer Determinants of Radio broadcast assistant Strain: Not on file  Food Insecurity: Not on file  Transportation Needs: Not on file  Physical Activity: Not on file  Stress: Not on file  Social Connections: Not on file     Family History: The patient's family history includes Cancer in his maternal grandmother; Heart attack in his maternal grandfather, paternal grandfather, and paternal grandmother; Heart failure (age of onset: 38) in his father; Parkinson's disease (age of onset: 56) in his mother.  ROS:   Please see the history of present  illness.     Review of Systems  Constitutional:  Positive for malaise/fatigue. Negative for chills and fever.  HENT:  Negative for sore throat.   Eyes:  Negative for blurred vision.  Respiratory:  Negative for shortness of breath.   Cardiovascular:  Positive for palpitations and leg swelling. Negative for chest pain, orthopnea, claudication and PND.  Gastrointestinal:  Negative for nausea and vomiting.  Genitourinary:  Negative for hematuria.  Musculoskeletal:  Positive for myalgias.  Neurological:  Negative for dizziness and loss of consciousness.  Psychiatric/Behavioral:  Negative for substance abuse.     EKGs/Labs/Other Studies Reviewed:    The following studies were reviewed today: TTE 12/11/19: ECHOCARDIOGRAPHIC DESCRIPTIONS -----------------------------------------------  AORTIC ROOT          Size: Not seen    Dissection: INDETERM FOR DISSECTION   AORTIC VALVE      Leaflets: Tricuspid             Morphology: MILDLY THICKENED      Mobility: Fully Mobile   LEFT VENTRICLE                                      Anterior: Normal          Size: Normal                                 Lateral: Normal   Contraction: Normal                                  Septal: Normal    Closest EF: 50% (Estimated)  Calc.EF: 48% (3D)      Apical: Normal     LV masses: No Masses                             Inferior: Normal           LVH: MODERATE LVH  Posterior: Normal   LV GLS(AVG): -12.8% Normal Range [ <= -16]  Dias.FxClass: N/A   MITRAL VALVE      Leaflets: ABNORMAL                Mobility: Fully mobile    Morphology: PROSTHETIC RING   LEFT ATRIUM          Size: MILDLY ENLARGED     LA masses: No masses                Normal IAS   MAIN PA          Size: Not seen   PULMONIC VALVE    Morphology: Normal      Mobility: Fully Mobile   RIGHT VENTRICLE          Size: MILDLY ENLARGED           Free wall: Normal   Contraction: Normal                    RV masses: No  Masses   TRICUSPID VALVE      Leaflets: Normal                  Mobility: Fully mobile    Morphology: Normal   RIGHT ATRIUM          Size: Normal                     RA Other: None     RA masses: No masses   PERICARDIUM         Fluid: No effusion   INFERIOR VENACAVA          Size: Not Seen   Not Seen   DOPPLER ECHO and OTHER SPECIAL PROCEDURES ------------------------------------     Aortic: No AR                  No AS      Mitral: MILD MR                PROSTHETIC MV RING      1.0 m/s peak vel    4 mmHg peak grad   1 mmHg mean grad     MV Inflow E Vel.= 80.0 cm/s  MV Annulus E'Vel.= 6.0 cm/s  E/E'Ratio= 13   Tricuspid: TRIVIAL TR             No TS   Pulmonary: No PR                  No PS       Other:   INTERPRETATION ---------------------------------------------------------------    MILD LV DYSFUNCTION (See above) WITH MODERATE LVH    NORMAL RIGHT VENTRICULAR SYSTOLIC FUNCTION    VALVULAR REGURGITATION: MILD MR, TRIVIAL TR    PROSTHETIC VALVE(S): PROSTHETIC MV RING    LV EF AT LOWER LIMITS OF NORMAL    3D acquisition and reconstructions were performed as part of this    examination to more accurately quantify the effects of identified    structural abnormalities as part of the exam. (post-processing on an    Independent workstation).     Compared with prior Echo study on 04/20/2013: NO SIGNIFICANT CHANGES   EKG:  No new tracing  Recent Labs: 09/19/2020: ALT 13; BUN 24; Creatinine 1.27; Hemoglobin 12.8; Platelet Count 164; Potassium 4.7; Sodium 140  Recent Lipid Panel    Component Value Date/Time   CHOL 185 02/22/2019 0757  TRIG 115 02/22/2019 0757   HDL 58 02/22/2019 0757   CHOLHDL 3.2 02/22/2019 0757   VLDL 23 02/22/2019 0757   LDLCALC 104 (H) 02/22/2019 0757     Risk Assessment/Calculations:    CHA2DS2-VASc Score = 5  This indicates a 7.2% annual risk of stroke. The patient's score is based upon: CHF History: Yes HTN History: Yes Diabetes History:  No Stroke History: No Vascular Disease History: Yes Age Score: 2 Gender Score: 0         Physical Exam:    VS:  BP 122/72   Pulse 70   Ht 6\' 1"  (1.854 m)   Wt 220 lb 9.6 oz (100.1 kg)   SpO2 97%   BMI 29.10 kg/m     Wt Readings from Last 3 Encounters:  10/11/20 220 lb 9.6 oz (100.1 kg)  05/22/20 217 lb 9.6 oz (98.7 kg)  05/17/20 217 lb (98.4 kg)     GEN:  Well nourished, well developed in no acute distress HEENT: Normal NECK: No JVD; No carotid bruits CARDIAC: RRR, soft systolic murmur. No rubs, gallops RESPIRATORY:  Clear to auscultation without rales, wheezing or rhonchi  ABDOMEN: Soft, non-tender, non-distended MUSCULOSKELETAL:  Trace LE edema; No deformity  SKIN: Warm and dry NEUROLOGIC:  Alert and oriented x 3 PSYCHIATRIC:  Normal affect   ASSESSMENT:    1. Mitral valve disease   2. S/P mitral valve repair   3. Encounter for chemotherapy management   4. Hyperlipidemia, unspecified hyperlipidemia type   5. Nonrheumatic mitral valve regurgitation   6. Paroxysmal atrial flutter (HCC)   7. AAA (abdominal aortic aneurysm) without rupture ([place])   8. 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 The patient has mild LV dysfunction with LVEF 45 to 50% that seems to be slightly improved from the echocardiogram performed here at [facility] in March 2015 when it was 40 to 45%. Has trace LE edema but otherwise appears stable.  -Repeat TTE for monitoring -Continue metop 12.5mg  daily -Needs SBE PPX  #Heart Failure with mildly Reduced LVEF: LVEF 45-50% on TTE in 10/2019. Appears relatively euvolemic today with NYHA class I-II symtpoms. Did not tolerate ACE due to hypotension. -Continue metop 25mg  XL daily -Pending blood pressures, can try to add low  dose losartan +/- farxiga -Low Na diet -Compression socks for LE edema  #Paroxysmal atrial flutter: S/p ablation in 2015 not on AC at this time due to history of RP bleeding in the past. No known recurrence.  -Continue metop 25mg  XL daily  #Hyperlipidemia: No known coronary artery disease. Statin intolerant -Continue zetia 10mg  daily -Goal LDL<100 -Check lipid panel  #Multiple abdominal aortic aneurysms, status post partial coiling: #Ascending aortic aneurysm: Aneurysmal dilatation of the ascending thoracic aorta measuring 44 x 45 mm -Repeat CTA planned for 10/2020; will follow-up -Needs continued serial imaging  #Thyroid Cancer: #Non-Hodgkins Lymphoma: In remission. Followed by Dr. Alvy [facility]. -Follow-up with Dr. Alvy [facility] as scheduled -TTE with strain  Discussed patient's history in detail with the patient and his wife today.  Medication Adjustments/Labs and Tests Ordered: Current medicines are reviewed at length with the patient today.  Concerns regarding medicines are outlined above.  Orders Placed This Encounter  Procedures   Lipid Profile   ECHOCARDIOGRAM COMPLETE   No orders of the  defined types were placed in this encounter.   Patient Instructions  Medication Instructions:   Your physician recommends that you continue on your current medications as directed. Please refer to the Current Medication list given to you today.  *If you need a refill on your cardiac medications before your next appointment, please call your pharmacy*   LAB:  THIS WEEK OR NEXT WEEK--LIPIDS--PLEASE COME FASTING TO THIS LAB APPOINTMENT  Testing/Procedures:  Your physician has requested that you have an echocardiogram. Echocardiography is a painless test that uses sound waves to create images of your heart. It provides your doctor with information about the size and shape of your heart and how well your heart's chambers and valves are working. This procedure takes approximately one hour.  There are no restrictions for this procedure.  DO ECHO WITH STRAIN   Follow-Up: At [facility], you and your health needs are our priority.  As part of our continuing mission to provide you with exceptional heart care, we have created designated Provider Care Teams.  These Care Teams include your primary Cardiologist (physician) and Advanced Practice Providers (APPs -  Physician Assistants and Nurse Practitioners) who all work together to provide you with the care you need, when you need it.  We recommend signing up for the patient portal called "MyChart".  Sign up information is provided on this After Visit Summary.  MyChart is used to connect with patients for Virtual Visits (Telemedicine).  Patients are able to view lab/test results, encounter notes, upcoming appointments, etc.  Non-urgent messages can be sent to your provider as well.   To learn more about what you can do with MyChart, go to [email or web address].    Your next appointment:   6 month(s)  The format for your next appointment:   In Person  Provider:   You will see one of the following Advanced Practice Providers on your designated Care Team:   Richardson Dopp, PA-C Robbie Lis, PA-C     Signed, Freada Bergeron, MD  10/11/2020 6:02 PM    [place]

## 2020-10-11 ENCOUNTER — Encounter: Payer: Self-pay | Admitting: Hematology and Oncology

## 2020-10-11 ENCOUNTER — Other Ambulatory Visit: Payer: Self-pay

## 2020-10-11 ENCOUNTER — Encounter: Payer: Self-pay | Admitting: Cardiology

## 2020-10-11 ENCOUNTER — Ambulatory Visit (INDEPENDENT_AMBULATORY_CARE_PROVIDER_SITE_OTHER): Payer: Medicare Other | Admitting: Cardiology

## 2020-10-11 VITALS — BP 122/72 | HR 70 | Ht 73.0 in | Wt 220.6 lb

## 2020-10-11 DIAGNOSIS — Z5111 Encounter for antineoplastic chemotherapy: Secondary | ICD-10-CM

## 2020-10-11 DIAGNOSIS — I714 Abdominal aortic aneurysm, without rupture, unspecified: Secondary | ICD-10-CM

## 2020-10-11 DIAGNOSIS — I059 Rheumatic mitral valve disease, unspecified: Secondary | ICD-10-CM

## 2020-10-11 DIAGNOSIS — E785 Hyperlipidemia, unspecified: Secondary | ICD-10-CM | POA: Diagnosis not present

## 2020-10-11 DIAGNOSIS — I4892 Unspecified atrial flutter: Secondary | ICD-10-CM | POA: Diagnosis not present

## 2020-10-11 DIAGNOSIS — I5022 Chronic systolic (congestive) heart failure: Secondary | ICD-10-CM

## 2020-10-11 DIAGNOSIS — Z9221 Personal history of antineoplastic chemotherapy: Secondary | ICD-10-CM

## 2020-10-11 DIAGNOSIS — I340 Nonrheumatic mitral (valve) insufficiency: Secondary | ICD-10-CM

## 2020-10-11 DIAGNOSIS — Z9889 Other specified postprocedural states: Secondary | ICD-10-CM | POA: Diagnosis not present

## 2020-10-11 NOTE — Patient Instructions (Addendum)
Medication Instructions:   Your physician recommends that you continue on your current medications as directed. Please refer to the Current Medication list given to you today.  *If you need a refill on your cardiac medications before your next appointment, please call your pharmacy*   LAB:  THIS WEEK OR NEXT WEEK--LIPIDS--PLEASE COME FASTING TO THIS LAB APPOINTMENT  Testing/Procedures:  Your physician has requested that you have an echocardiogram. Echocardiography is a painless test that uses sound waves to create images of your heart. It provides your doctor with information about the size and shape of your heart and how well your heart's chambers and valves are working. This procedure takes approximately one hour. There are no restrictions for this procedure.  DO ECHO WITH STRAIN   Follow-Up: At [facility], you and your health needs are our priority.  As part of our continuing mission to provide you with exceptional heart care, we have created designated Provider Care Teams.  These Care Teams include your primary Cardiologist (physician) and Advanced Practice Providers (APPs -  Physician Assistants and Nurse Practitioners) who all work together to provide you with the care you need, when you need it.  We recommend signing up for the patient portal called "MyChart".  Sign up information is provided on this After Visit Summary.  MyChart is used to connect with patients for Virtual Visits (Telemedicine).  Patients are able to view lab/test results, encounter notes, upcoming appointments, etc.  Non-urgent messages can be sent to your provider as well.   To learn more about what you can do with MyChart, go to [email or web address].    Your next appointment:   6 month(s)  The format for your next appointment:   In Person  Provider:   You will see one of the following Advanced Practice Providers on your designated Care Team:   Richardson Dopp, PA-C [place], [place]

## 2020-10-12 ENCOUNTER — Telehealth: Payer: Self-pay | Admitting: Cardiology

## 2020-10-12 NOTE — Telephone Encounter (Signed)
New Message:      Patient saw Dr Johney Frame yesterday afternoon. The wife called and wanted to be sure that  Dr Johney Frame and the Staff  know that the patient tested positive for Covid today.

## 2020-10-12 NOTE — Telephone Encounter (Signed)
Did speak with the pt and wife.  Best wishes provided.  Did cancel his lab for next week.  Rescheduled lab for 10/24/20 and he will have his echo on 8/3.  Both parties were gracious for all the assistance provided.

## 2020-10-17 ENCOUNTER — Other Ambulatory Visit: Payer: Medicare Other

## 2020-10-20 ENCOUNTER — Other Ambulatory Visit: Payer: Medicare Other

## 2020-10-20 DIAGNOSIS — I722 Aneurysm of renal artery: Secondary | ICD-10-CM | POA: Diagnosis not present

## 2020-10-20 DIAGNOSIS — E7800 Pure hypercholesterolemia, unspecified: Secondary | ICD-10-CM | POA: Diagnosis not present

## 2020-10-20 DIAGNOSIS — N400 Enlarged prostate without lower urinary tract symptoms: Secondary | ICD-10-CM | POA: Diagnosis not present

## 2020-10-24 ENCOUNTER — Other Ambulatory Visit: Payer: Medicare Other

## 2020-10-26 ENCOUNTER — Ambulatory Visit (HOSPITAL_COMMUNITY): Payer: Medicare Other | Attending: Cardiology

## 2020-10-26 ENCOUNTER — Other Ambulatory Visit: Payer: Medicare Other

## 2020-10-26 ENCOUNTER — Other Ambulatory Visit: Payer: Self-pay

## 2020-10-26 DIAGNOSIS — E785 Hyperlipidemia, unspecified: Secondary | ICD-10-CM

## 2020-10-26 DIAGNOSIS — Z5111 Encounter for antineoplastic chemotherapy: Secondary | ICD-10-CM | POA: Insufficient documentation

## 2020-10-26 DIAGNOSIS — I059 Rheumatic mitral valve disease, unspecified: Secondary | ICD-10-CM | POA: Diagnosis not present

## 2020-10-26 DIAGNOSIS — Z9889 Other specified postprocedural states: Secondary | ICD-10-CM

## 2020-10-26 DIAGNOSIS — Z954 Presence of other heart-valve replacement: Secondary | ICD-10-CM | POA: Diagnosis not present

## 2020-10-26 LAB — LIPID PANEL
Chol/HDL Ratio: 3.5 ratio (ref 0.0–5.0)
Cholesterol, Total: 184 mg/dL (ref 100–199)
HDL: 52 mg/dL (ref >39)
LDL Chol Calc (NIH): 112 mg/dL — ABNORMAL HIGH (ref 0–99)
Triglycerides: 111 mg/dL (ref 0–149)
VLDL Cholesterol Cal: 20 mg/dL (ref 5–40)

## 2020-10-26 LAB — ECHOCARDIOGRAM COMPLETE
Area-P 1/2: 1.65 cm2
MV VTI: 2.18 cm2
S' Lateral: 3.9 cm

## 2020-10-30 ENCOUNTER — Other Ambulatory Visit: Payer: Self-pay | Admitting: *Deleted

## 2020-10-30 DIAGNOSIS — E785 Hyperlipidemia, unspecified: Secondary | ICD-10-CM

## 2020-10-31 ENCOUNTER — Encounter (HOSPITAL_BASED_OUTPATIENT_CLINIC_OR_DEPARTMENT_OTHER): Payer: Self-pay | Admitting: Emergency Medicine

## 2020-10-31 ENCOUNTER — Other Ambulatory Visit: Payer: Self-pay

## 2020-10-31 ENCOUNTER — Emergency Department (HOSPITAL_BASED_OUTPATIENT_CLINIC_OR_DEPARTMENT_OTHER): Payer: Medicare Other

## 2020-10-31 ENCOUNTER — Telehealth (HOSPITAL_COMMUNITY): Payer: Self-pay

## 2020-10-31 ENCOUNTER — Emergency Department (HOSPITAL_BASED_OUTPATIENT_CLINIC_OR_DEPARTMENT_OTHER)
Admission: EM | Admit: 2020-10-31 | Discharge: 2020-10-31 | Disposition: A | Discharge status: Home / Self Care | Payer: Medicare Other | Attending: Emergency Medicine | Admitting: Emergency Medicine

## 2020-10-31 DIAGNOSIS — R600 Localized edema: Secondary | ICD-10-CM | POA: Insufficient documentation

## 2020-10-31 DIAGNOSIS — R000 Tachycardia, unspecified: Secondary | ICD-10-CM | POA: Diagnosis not present

## 2020-10-31 DIAGNOSIS — Z85828 Personal history of other malignant neoplasm of skin: Secondary | ICD-10-CM | POA: Insufficient documentation

## 2020-10-31 DIAGNOSIS — R079 Chest pain, unspecified: Secondary | ICD-10-CM

## 2020-10-31 DIAGNOSIS — I4891 Unspecified atrial fibrillation: Secondary | ICD-10-CM | POA: Diagnosis not present

## 2020-10-31 DIAGNOSIS — R002 Palpitations: Secondary | ICD-10-CM | POA: Diagnosis present

## 2020-10-31 DIAGNOSIS — Z85850 Personal history of malignant neoplasm of thyroid: Secondary | ICD-10-CM | POA: Diagnosis not present

## 2020-10-31 DIAGNOSIS — R0789 Other chest pain: Secondary | ICD-10-CM | POA: Diagnosis not present

## 2020-10-31 LAB — COMPREHENSIVE METABOLIC PANEL
ALT: 16 U/L (ref 0–44)
AST: 15 U/L (ref 15–41)
Albumin: 3.9 g/dL (ref 3.5–5.0)
Alkaline Phosphatase: 56 U/L (ref 38–126)
Anion gap: 9 (ref 5–15)
BUN: 24 mg/dL — ABNORMAL HIGH (ref 8–23)
CO2: 25 mmol/L (ref 22–32)
Calcium: 8.8 mg/dL — ABNORMAL LOW (ref 8.9–10.3)
Chloride: 107 mmol/L (ref 98–111)
Creatinine, Ser: 1.16 mg/dL (ref 0.61–1.24)
GFR, Estimated: >60 mL/min (ref >60)
Glucose, Bld: 101 mg/dL — ABNORMAL HIGH (ref 70–99)
Potassium: 3.9 mmol/L (ref 3.5–5.1)
Sodium: 141 mmol/L (ref 135–145)
Total Bilirubin: 0.8 mg/dL (ref 0.3–1.2)
Total Protein: 5.9 g/dL — ABNORMAL LOW (ref 6.5–8.1)

## 2020-10-31 LAB — TROPONIN I (HIGH SENSITIVITY)
Troponin I (High Sensitivity): 6 ng/L (ref <18)
Troponin I (High Sensitivity): 7 ng/L (ref <18)

## 2020-10-31 LAB — CBC
HCT: 38.1 % — ABNORMAL LOW (ref 39.0–52.0)
Hemoglobin: 12.7 g/dL — ABNORMAL LOW (ref 13.0–17.0)
MCH: 30 pg (ref 26.0–34.0)
MCHC: 33.3 g/dL (ref 30.0–36.0)
MCV: 89.9 fL (ref 80.0–100.0)
Platelets: 190 10*3/uL (ref 150–400)
RBC: 4.24 MIL/uL (ref 4.22–5.81)
RDW: 12.7 % (ref 11.5–15.5)
WBC: 5.6 10*3/uL (ref 4.0–10.5)
nRBC: 0 % (ref 0.0–0.2)

## 2020-10-31 LAB — BRAIN NATRIURETIC PEPTIDE: B Natriuretic Peptide: 154.1 pg/mL — ABNORMAL HIGH (ref 0.0–100.0)

## 2020-10-31 MED ORDER — SODIUM CHLORIDE 0.9 % IV SOLN: Freq: Once | INTRAVENOUS | Status: AC

## 2020-10-31 NOTE — ED Notes (Signed)
  Patient converted from a fib to sinus rhythm while speaking with EDP.  Repeat EKG obtained.

## 2020-10-31 NOTE — Telephone Encounter (Signed)
Reached out to patient to schedule ED f/u. Left vm for pt to call back

## 2020-10-31 NOTE — ED Notes (Signed)
XR present with portable

## 2020-10-31 NOTE — ED Provider Notes (Addendum)
[facility] EMERGENCY DEPT Provider Note  CSN: [id number] Arrival date & time: 10/31/20 0203  Chief Complaint(s) Tachycardia  HPI Sean Stanley is a 80 y.o. male with extensive past medical history listed below who presents to the emergency department for palpitations that began 2 hours prior to arrival.  Reports that around 12 AM he felt his heart rate racing and noted that rate was in the 130s.  Felt anxious inside without overt chest pain or shortness of breath.  No alleviating or aggravating factors.  No lightheadedness.  No syncopal episodes.  Tachycardia persisted prompting his visit.  Patient reports he had COVID 2 weeks ago and was treated with Paxlovid. No other recent fevers or infections.  No coughing or congestion.  No nausea or vomiting.  No abdominal pain.  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   s/p coiling 12/16/12 - [facility]   Atrial flutter ([facility])    BPH (benign prostatic hyperplasia)    Cancer ([place])    melanoma on lower right back and left chest - surgically removed and cleared   Dysrhythmia    H/O agent Orange exposure    Headache    migraine- not current   High bilirubin    pt states it's genetic   History of blood transfusion    Hypercholesteremia    Hypercholesterolemia    Lymphoma ([place]) 04/2018   Mitral valve disease    annuloplasty 2002 [facility]   Neuropathy    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/08/2019   Deficiency anemia 04/19/2019   Vitamin D deficiency 04/19/2019   Hypocalcemia 04/19/2019   Acquired lymphedema 02/22/2019   Speech and language deficits 02/22/2019   Thyroid cancer ([place]) 11/26/2018   Lymphoma ([place]) 11/23/2018   Hyperlipidemia 11/03/2018   Neck discomfort 09/23/2018   Other constipation 08/26/2018   Pancytopenia, acquired ([place]) 07/28/2018   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   Insomnia secondary to chronic pain 10/28/2017   OSA (obstructive sleep apnea) 10/28/2017   OSA on CPAP 07/23/2017   Neuropathy 07/23/2017   RLS (restless legs syndrome) 07/23/2017   Gallstones 03/30/2013   Palpitations 02/16/2013   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   Pain in limb-Abdominal 09/21/2012   Nontraumatic retroperitoneal hematoma, spontaneous while on anticoagulant therapy 08/29/2012   Acute post-hemorrhagic anemia 08/29/2012   Acute GI bleeding, spontaneous  08/06/2012   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/ring by Dr Evelina Dun at [facility]02 08/06/2012   Normal coronary arteries at cath  2006 (false positive Nuc) 08/06/2012   PVD, < 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 Authorizing Provider  acetaminophen (TYLENOL) 500 MG tablet Take 1,000 mg by mouth daily as needed for moderate pain.    [provider]  alfuzosin (UROXATRAL) 10 MG 24 hr tablet Take 10 mg by mouth at bedtime.     [provider]  buPROPion (WELLBUTRIN XL) 150 MG 24 hr tablet Take 1 tablet (150 mg total) by mouth daily. 11/18/19   Dohmeier, Asencion Partridge, MD  Cholecalciferol (VITAMIN D) 2000 units tablet Take 2,000 Units by mouth at bedtime.     [provider]  COVID-19 mRNA vaccine, Moderna, 100 MCG/0.5ML injection Inject into the muscle. Patient not taking: Reported on 10/11/2020 09/01/20   Carlyle Basques, MD  ezetimibe (ZETIA) 10 MG tablet Take 10 mg by mouth at bedtime.    [provider]  finasteride (PROSCAR) 5 MG tablet Take 5 mg by mouth at bedtime.  09/23/14   [provider]  levothyroxine (SYNTHROID) 175 MCG tablet Take 1 tablet (175 mcg total) by mouth daily. 05/25/19   Heath Lark, MD  lidocaine-prilocaine (EMLA) cream Apply to affected area once before sleep time- 05/17/20   Dohmeier, Asencion Partridge, MD  metoprolol succinate (TOPROL-XL) 25 MG 24 hr tablet Take 12.5 mg by mouth daily.  06/11/16   [provider]  Polyethyl Glycol-Propyl Glycol (SYSTANE HYDRATION PF OP) Place 1 drop into both eyes 3 (three) times daily as needed (dry eyes).     [provider]  rOPINIRole (REQUIP) 0.25 MG tablet Take 1 tablet (0.25 mg total) by mouth at bedtime. Patient not taking: Reported on 10/11/2020 05/17/20   Dohmeier, Asencion Partridge, MD                                                                                                                                    Past Surgical History Past Surgical History:  Procedure Laterality Date   ABDOMINAL ANGIOGRAM  08/05/12   aneurysm repair     CARDIOVERSION  02/12/2012   Procedure: CARDIOVERSION;  Surgeon: Pixie Casino, MD;  Location[facility] ENDOSCOPY;  Service: Cardiovascular;  Laterality: N/A;   CARDIOVERSION N/A 06/22/2013   Procedure: CARDIOVERSION;  Surgeon: [name], MD;  Location: [place];  Service: Cardiovascular;  Laterality: N/A;    CATARACT EXTRACTION Bilateral 2018   with lens implant   CHOLECYSTECTOMY     COLONOSCOPY     ESOPHAGOGASTRODUODENOSCOPY     IR IMAGING GUIDED PORT INSERTION  05/26/2018   LYMPH NODE BIOPSY Left 04/27/2018   Procedure: EXCISIONAL BIOPSY OF LEFT CERVICAL LYMPH NODE;  Surgeon: Melida Quitter, MD;  Location: [place];  Service: ENT;  Laterality: Left;   LYMPH NODE BIOPSY Left 11/23/2018   Procedure: LEFT CERVICAL LYMPH NODE OPEN BIOPSY;  Surgeon: Melida Quitter, MD;  Location: [place];  Service: ENT;  Laterality: Left;   MENISCUS REPAIR Right 2009   MITRAL VALVE REPAIR  2002   [facility]   NM MYOVIEW LTD  07/22/2006   no ischemia   RADICAL NECK DISSECTION Left 01/15/2019   Procedure: LEFT NECK DISSECTION;  Surgeon: Melida Quitter, MD;  Location: [place];  Service: ENT;  Laterality: Left;   RIGHT HEART CATH  06/19/2004   normal right heart dynamics. EF 50%   TEE WITHOUT CARDIOVERSION  02/12/2012   Procedure: TRANSESOPHAGEAL ECHOCARDIOGRAM (TEE);  Surgeon: Pixie Casino, MD;  Location: [facility] ENDOSCOPY;  Service: Cardiovascular;  Laterality: N/A;   TEE WITHOUT CARDIOVERSION N/A 06/22/2013   Procedure: TRANSESOPHAGEAL ECHOCARDIOGRAM (TEE);  Surgeon: [name], MD;  Location: [facility] ENDOSCOPY;  Service: Cardiovascular;  Laterality: N/A;   THYROIDECTOMY N/A 01/15/2019   Procedure: TOTAL THYROIDECTOMY;  Surgeon: Melida Quitter, MD;  Location: [facility] OR;  Service: ENT;  Laterality: N/A;   Family History Family History  Problem Relation Age of Onset   Parkinson's disease Mother 68   Heart failure Father 83   Cancer Maternal Grandmother    Heart attack Maternal Grandfather    Heart attack Paternal Grandmother    Heart attack Paternal Grandfather     Social History Social History   Tobacco Use   Smoking status: Never   Smokeless tobacco: Never  Vaping Use   Vaping Use: Never used  Substance Use Topics   Alcohol use: Not Currently    Comment: social   Drug use: No   Allergies Aspirin, Levaquin  [levofloxacin in d5w], Nickel, Other, Statins, Allopurinol, and Ampicillin  Review of Systems Review of Systems All other systems are reviewed and are negative for acute change except as noted in the HPI  Physical Exam Vital Signs  I have reviewed the triage vital signs BP 100/85 (BP Location: Right Arm)   Pulse (!) 135   Temp 97.7 F (36.5 C) (Oral)   Resp 18   Ht '6\' 1"'$  (1.854 m)   Wt 99.8 kg   SpO2 98%   BMI 29.03 kg/m   Physical Exam Vitals reviewed.  Constitutional:      General: He is not in acute distress.    Appearance: He is well-developed. He is not diaphoretic.  HENT:     Head: Normocephalic and atraumatic.     Nose: Nose normal.  Eyes:     General: No scleral icterus.       Right eye: No discharge.        Left eye: No discharge.     Conjunctiva/sclera: Conjunctivae normal.     Pupils: Pupils are equal, round, and reactive to light.  Cardiovascular:     Rate and Rhythm: Tachycardia present. Rhythm irregularly irregular.     Heart sounds: No murmur heard.   No friction rub. No gallop.  Pulmonary:     Effort: Pulmonary effort is normal. No respiratory distress.     Breath sounds: Normal breath sounds. No stridor. No rales.  Abdominal:     General: There is no distension.     Palpations: Abdomen is soft.     Tenderness: There is no abdominal tenderness.  Musculoskeletal:        General: No tenderness.     Cervical back: Normal range of motion and neck supple.  Right lower leg: 1+ Pitting Edema present.     Left lower leg: 1+ Pitting Edema present.  Skin:    General: Skin is warm and dry.     Findings: No erythema or rash.  Neurological:     Mental Status: He is alert and oriented to person, place, and time.    ED Results and Treatments Labs (all labs ordered are listed, but only abnormal results are displayed) Labs Reviewed  CBC - Abnormal; Notable for the following components:      Result Value   Hemoglobin 12.7 (*)    HCT 38.1 (*)    All  other components within normal limits  COMPREHENSIVE METABOLIC PANEL - Abnormal; Notable for the following components:   Glucose, Bld 101 (*)    BUN 24 (*)    Calcium 8.8 (*)    Total Protein 5.9 (*)    All other components within normal limits  BRAIN NATRIURETIC PEPTIDE - Abnormal; Notable for the following components:   B Natriuretic Peptide 154.1 (*)    All other components within normal limits  TROPONIN I (HIGH SENSITIVITY)  TROPONIN I (HIGH SENSITIVITY)                                                                                                                         EKG - Not crossing over        Radiology DG Chest Port 1 View  Result Date: 10/31/2020 CLINICAL DATA:  Tachycardia EXAM: PORTABLE CHEST 1 VIEW COMPARISON:  05/19/2020 FINDINGS: Cardiac shadow is enlarged but stable. Aortic calcifications are noted. Right chest wall port is noted in satisfactory position. The lungs are well aerated bilaterally. No focal infiltrate or sizable effusion is seen. No bony abnormality is noted. IMPRESSION: No acute abnormality noted. Electronically Signed   By: Inez Catalina M.D.   On: 10/31/2020 02:47    Pertinent labs & imaging results that were available during my care of the patient were reviewed by me and considered in my medical decision making (see MDM for details).  Medications Ordered in ED Medications  0.9 %  sodium chloride infusion ( Intravenous New Bag/Given 10/31/20 0253)                                                                                                                                     Procedures .1-3 Lead EKG Interpretation  Date/Time: 10/31/2020 6:51  AM Performed by: Fatima Blank, MD Authorized by: Fatima Blank, MD     Interpretation: abnormal     ECG rate:  142   ECG rate assessment: tachycardic     Rhythm: atrial fibrillation     Ectopy: none     Conduction: normal   .1-3 Lead EKG Interpretation  Date/Time: 10/31/2020 6:51  AM Performed by: Fatima Blank, MD Authorized by: Fatima Blank, MD     Interpretation: normal     ECG rate:  77   ECG rate assessment: normal     Rhythm: sinus rhythm     Ectopy: none     Conduction: normal    (including critical care time)  Medical Decision Making / ED Course I have reviewed the nursing notes for this encounter and the patient's prior records (if available in EHR or on provided paperwork).  Sean Stanley was evaluated in Emergency Department on 10/31/2020 for the symptoms described in the history of present illness. He was evaluated in the context of the global COVID-19 pandemic, which necessitated consideration that the patient might be at risk for infection with the SARS-CoV-2 virus that causes COVID-19. Institutional protocols and algorithms that pertain to the evaluation of patients at risk for COVID-19 are in a state of rapid change based on information released by regulatory bodies including the CDC and federal and state organizations. These policies and algorithms were followed during the patient's care in the ED.     Patient presents in A. fib RVR. Given the patient's history of abdominal aneurysms with prior bleeds, anticoagulation is not an option. Will not be able to cardiovert. While discussing options and starting him on diltiazem, he self converted to normal sinus rhythm and rates of 70s.  Will obtain screening labs and monitor.  Pertinent labs & imaging results that were available during my care of the patient were reviewed by me and considered in my medical decision making:  No leukocytosis or significant anemia. No electrolyte derangements or renal sufficiency. Troponins negative x2. BNP ~150.  Patient monitored for 3 hours without recurrence. Feel he is stable for outpatient management. Ambulatory referral to A. fib clinic placed.  Final Clinical Impression(s) / ED Diagnoses Final diagnoses:  Chest pain  Atrial fibrillation  with RVR (Normal)    The patient appears reasonably screened and/or stabilized for discharge and I doubt any other medical condition or other [facility] requiring further screening, evaluation, or treatment in the ED at this time prior to discharge. Safe for discharge with strict return precautions.  Disposition: Discharge  Condition: Good  I have discussed the results, Dx and Tx plan with the patient/family who expressed understanding and agree(s) with the plan. Discharge instructions discussed at length. The patient/family was given strict return precautions who verbalized understanding of the instructions. No further questions at time of discharge.    ED Discharge Orders          Ordered    Amb Referral to AFIB Clinic        10/31/20 0524            Follow Up: [place] [address] [id number] [place] [phone] Call  if you have not been called about your appointment by Thursday mid-day.  [name], MD [address] [place] [address] [phone]  Call  as needed    This chart was dictated using voice recognition software.  Despite best efforts to proofread,  errors can occur which can change the documentation meaning.      Fatima Blank, MD 10/31/20 [phone]

## 2020-10-31 NOTE — ED Triage Notes (Signed)
  Patient comes in with tachycardia that he states started happening around 2 hours ago.  Patient states he was sleeping and felt his HR go up.  Checked HR with pulse oximeter and showed 138.  Has hx of a fib and takes carvedilol. Patient states he has no pain but felt anxious at the time and felt like his heart was going to come out of his chest.  No syncopal symptoms.  Patient states he just got over COVID, was taking paxlovid and had rebound COVID.  No pain at this time.

## 2020-11-01 DIAGNOSIS — E7800 Pure hypercholesterolemia, unspecified: Secondary | ICD-10-CM | POA: Diagnosis not present

## 2020-11-01 DIAGNOSIS — Z1389 Encounter for screening for other disorder: Secondary | ICD-10-CM | POA: Diagnosis not present

## 2020-11-01 DIAGNOSIS — Z9889 Other specified postprocedural states: Secondary | ICD-10-CM | POA: Diagnosis not present

## 2020-11-01 DIAGNOSIS — Z85820 Personal history of malignant melanoma of skin: Secondary | ICD-10-CM | POA: Diagnosis not present

## 2020-11-01 DIAGNOSIS — I722 Aneurysm of renal artery: Secondary | ICD-10-CM | POA: Diagnosis not present

## 2020-11-01 DIAGNOSIS — N400 Enlarged prostate without lower urinary tract symptoms: Secondary | ICD-10-CM | POA: Diagnosis not present

## 2020-11-01 DIAGNOSIS — C73 Malignant neoplasm of thyroid gland: Secondary | ICD-10-CM | POA: Diagnosis not present

## 2020-11-01 DIAGNOSIS — Z0000 Encounter for general adult medical examination without abnormal findings: Secondary | ICD-10-CM | POA: Diagnosis not present

## 2020-11-01 DIAGNOSIS — I4891 Unspecified atrial fibrillation: Secondary | ICD-10-CM | POA: Diagnosis not present

## 2020-11-01 DIAGNOSIS — C8310 Mantle cell lymphoma, unspecified site: Secondary | ICD-10-CM | POA: Diagnosis not present

## 2020-11-01 DIAGNOSIS — G622 Polyneuropathy due to other toxic agents: Secondary | ICD-10-CM | POA: Diagnosis not present

## 2020-11-03 ENCOUNTER — Other Ambulatory Visit: Payer: Self-pay

## 2020-11-03 ENCOUNTER — Telehealth: Payer: Self-pay | Admitting: *Deleted

## 2020-11-03 ENCOUNTER — Ambulatory Visit (HOSPITAL_COMMUNITY)
Admission: RE | Admit: 2020-11-03 | Discharge: 2020-11-03 | Disposition: A | Discharge status: Home / Self Care | Payer: Medicare Other | Source: Ambulatory Visit | Attending: Nurse Practitioner | Admitting: Nurse Practitioner

## 2020-11-03 ENCOUNTER — Encounter (HOSPITAL_COMMUNITY): Payer: Self-pay | Admitting: Nurse Practitioner

## 2020-11-03 VITALS — BP 128/72 | HR 69 | Ht 73.0 in | Wt 216.0 lb

## 2020-11-03 DIAGNOSIS — Z8249 Family history of ischemic heart disease and other diseases of the circulatory system: Secondary | ICD-10-CM | POA: Diagnosis not present

## 2020-11-03 DIAGNOSIS — I480 Paroxysmal atrial fibrillation: Secondary | ICD-10-CM | POA: Insufficient documentation

## 2020-11-03 DIAGNOSIS — I4892 Unspecified atrial flutter: Secondary | ICD-10-CM

## 2020-11-03 DIAGNOSIS — Z79899 Other long term (current) drug therapy: Secondary | ICD-10-CM | POA: Diagnosis not present

## 2020-11-03 DIAGNOSIS — Z7901 Long term (current) use of anticoagulants: Secondary | ICD-10-CM | POA: Insufficient documentation

## 2020-11-03 NOTE — Progress Notes (Signed)
Primary Care Physician: [name], MD Referring Physician: ER f/u  Prior Cardiologist: Dr. Edwin Dada ([facility]), recently transferred over to Dr. Joslyn Devon is a 80 y.o. male with a h/o  MV repair 2002,  multi abdominal aneurysms with prior bleeding,(followed at [facility]),  unable to take anticoagulation, h/o atrial flutter, that is in the afib clinic, for onset of palpitations 8/9. He was found to be in rapid atrial fib at 143 bpm. He could not be cardioverted as he is not on anticoagulation 2/2 to multiple abdominal  aneurysms. He converted as IV was being started.  He was watched for another 3 hours and then d/c home with f/u here.  He is on metoprolol daily.   He states h/o ablation in 2015 at [facility] and had Covid the end of July. This is the first arrhythmia he has dad since ablation. He is in SR today.   Today, he denies symptoms of palpitations, chest pain, shortness of breath, orthopnea, PND, lower extremity edema, dizziness, presyncope, syncope, or neurologic sequela. The patient is tolerating medications without difficulties and is otherwise without complaint today.   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   s/p coiling 12/16/12 - [facility]   Atrial flutter ([facility])    BPH (benign prostatic hyperplasia)    Cancer ([place])    melanoma on lower right back and left chest - surgically removed and cleared   Dysrhythmia    H/O agent Orange exposure    Headache    migraine- not current   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 Maxwell Caul told him he had improved   Pneumonia    Thoracic ascending aortic aneurysm ([place])    4.5 cm 03/2018 CT   Thyroid cancer [facility])    Past Surgical History:  Procedure Laterality Date   ABDOMINAL ANGIOGRAM  08/05/12   aneurysm repair     CARDIOVERSION  02/12/2012   Procedure: CARDIOVERSION;  Surgeon: Pixie Casino, MD;  Location: [facility] ENDOSCOPY;  Service: Cardiovascular;  Laterality: N/A;   CARDIOVERSION N/A 06/22/2013   Procedure: CARDIOVERSION;  Surgeon: [name], MD;  Location: [place];  Service: Cardiovascular;  Laterality: N/A;   CATARACT EXTRACTION Bilateral 2018   with lens implant   CHOLECYSTECTOMY     COLONOSCOPY     ESOPHAGOGASTRODUODENOSCOPY     IR IMAGING GUIDED PORT INSERTION  05/26/2018   LYMPH NODE BIOPSY Left 04/27/2018   Procedure: EXCISIONAL BIOPSY OF LEFT CERVICAL LYMPH NODE;  Surgeon: Melida Quitter, MD;  Location: [place];  Service: ENT;  Laterality: Left;   LYMPH NODE BIOPSY Left 11/23/2018   Procedure: LEFT CERVICAL LYMPH NODE OPEN BIOPSY;  Surgeon:  Melida Quitter, MD;  Location: [place];  Service: ENT;  Laterality: Left;   MENISCUS REPAIR Right 2009   MITRAL VALVE REPAIR  2002   [facility]   NM MYOVIEW LTD  07/22/2006   no ischemia   RADICAL NECK DISSECTION Left 01/15/2019   Procedure: LEFT NECK DISSECTION;  Surgeon: Melida Quitter, MD;  Location: [place];  Service: ENT;  Laterality: Left;   RIGHT HEART CATH  06/19/2004   normal right heart dynamics. EF 50%   TEE WITHOUT CARDIOVERSION  02/12/2012   Procedure: TRANSESOPHAGEAL ECHOCARDIOGRAM (TEE);  Surgeon: Pixie Casino, MD;  Location: [facility] ENDOSCOPY;  Service: Cardiovascular;  Laterality: N/A;   TEE WITHOUT CARDIOVERSION N/A 06/22/2013   Procedure: TRANSESOPHAGEAL  ECHOCARDIOGRAM (TEE);  Surgeon: [name], MD;  Location: [place];  Service: Cardiovascular;  Laterality: N/A;   THYROIDECTOMY N/A 01/15/2019   Procedure: TOTAL THYROIDECTOMY;  Surgeon: Melida Quitter, MD;  Location: [facility] OR;  Service: ENT;  Laterality: N/A;    Current Outpatient Medications  Medication Sig Dispense Refill   acetaminophen (TYLENOL) 500 MG tablet Take 1,000 mg by mouth daily as needed for moderate pain.     alfuzosin (UROXATRAL) 10 MG 24 hr tablet Take 10 mg by mouth at bedtime.      buPROPion (WELLBUTRIN XL) 150 MG 24 hr tablet Take 1 tablet (150 mg total) by mouth daily. 90 tablet 3   Cholecalciferol (VITAMIN D) 2000 units tablet Take 2,000 Units by mouth at bedtime.      COVID-19 mRNA vaccine, Moderna, 100 MCG/0.5ML injection Inject into the muscle. (Patient not taking: Reported on 10/11/2020) 0.25 mL 0   ezetimibe (ZETIA) 10 MG tablet Take 10 mg by mouth at bedtime.     finasteride (PROSCAR) 5 MG tablet Take 5 mg by mouth at bedtime.      levothyroxine (SYNTHROID) 175 MCG tablet Take 1 tablet (175 mcg total) by mouth daily.     lidocaine-prilocaine (EMLA) cream Apply to affected area once before sleep time- 30 g 3   metoprolol succinate (TOPROL-XL) 25 MG 24 hr tablet Take 12.5 mg by mouth daily.      Polyethyl Glycol-Propyl Glycol (SYSTANE HYDRATION PF OP) Place 1 drop into both eyes 3 (three) times daily as needed (dry eyes).      rOPINIRole (REQUIP) 0.25 MG tablet Take 1 tablet (0.25 mg total) by mouth at bedtime. (Patient not taking: Reported on 10/11/2020) 30 tablet 5   No current facility-administered medications for this encounter.    Allergies  Allergen Reactions   Aspirin     Has been instructed not to take any blood thinners even aspirin due to history of several aneurysms    Levaquin [Levofloxacin In D5w]     tendonitis   Nickel Itching   Other     No blood thinners due to history of several aneurysms    Statins Other (See Comments)    Muscle  aches   Allopurinol Rash   Ampicillin Rash    Did it involve swelling of the face/tongue/throat, SOB, or low BP? No Did it involve sudden or severe rash/hives, skin peeling, or any reaction on the inside of your mouth or nose? Yes Did you need to seek medical attention at a hospital or doctor's office? Yes When did it last happen?      50+ years If all above answers are "NO", may proceed with cephalosporin use.     Social History   Socioeconomic History   Marital status: Married  Spouse name: [name]   Number of children: 2   Years of education: Not on file   Highest education level: Not on file  Occupational History   Occupation: retired Hydrologist  Tobacco Use   Smoking status: Never   Smokeless tobacco: Never  Vaping Use   Vaping Use: Never used  Substance and Sexual Activity   Alcohol use: Not Currently    Comment: social   Drug use: No   Sexual activity: Not on file  Other Topics Concern   Not on file  Social History Narrative   Lives in [place], Retired   Science writer Determinants of Radio broadcast assistant Strain: Not on file  Food Insecurity: Not on file  Transportation Needs: Not on file  Physical Activity: Not on file  Stress: Not on file  Social Connections: Not on file  Intimate Partner Violence: Not on file    Family History  Problem Relation Age of Onset   Parkinson's disease Mother 2   Heart failure Father 59   Cancer Maternal Grandmother    Heart attack Maternal Grandfather    Heart attack Paternal Grandmother    Heart attack Paternal Grandfather     ROS- All systems are reviewed and negative except as per the HPI above  Physical Exam: There were no vitals filed for this visit. Wt Readings from Last 3 Encounters:  10/31/20 99.8 kg  10/11/20 100.1 kg  05/22/20 98.7 kg    Labs: Lab Results  Component Value Date   NA 141 10/31/2020   K 3.9 10/31/2020   CL 107 10/31/2020   CO2 25 10/31/2020   GLUCOSE 101 (H) 10/31/2020   BUN 24 (H)  10/31/2020   CREATININE 1.16 10/31/2020   CALCIUM 8.8 (L) 10/31/2020   PHOS 3.4 08/11/2012   MG 2.2 08/11/2012   Lab Results  Component Value Date   INR 1.0 05/26/2018   Lab Results  Component Value Date   CHOL 184 10/26/2020   HDL 52 10/26/2020   LDLCALC 112 (H) 10/26/2020   TRIG 111 10/26/2020     GEN- The patient is well appearing, alert and oriented x 3 today.   Head- normocephalic, atraumatic Eyes-  Sclera clear, conjunctiva pink Ears- hearing intact Oropharynx- clear Neck- supple, no JVP Lymph- no cervical lymphadenopathy Lungs- Clear to ausculation bilaterally, normal work of breathing Heart- Regular rate and rhythm, no murmurs, rubs or gallops, PMI not laterally displaced GI- soft, NT, ND, + BS Extremities- no clubbing, cyanosis, or edema MS- no significant deformity or atrophy Skin- no rash or lesion Psych- euthymic mood, full affect Neuro- strength and sensation are intact  EKG-ER ekg reviewed showing rapid afib, ekg today shows NSR at 69 bpm  Epic records reviewed  Echo- 1. Left ventricular ejection fraction, by estimation, is 50 to 55%. Left  ventricular ejection fraction by 3D volume is 48 %. The left ventricle has  mildly decreased function. The left ventricle demonstrates global  hypokinesis. Left ventricular diastolic   parameters are consistent with Grade I diastolic dysfunction (impaired  relaxation). Elevated left ventricular end-diastolic pressure. The average  left ventricular global longitudinal strain is 18.8 %. The global  longitudinal strain is normal.   2. Right ventricular systolic function is low normal. The right  ventricular size is normal. There is normal pulmonary artery systolic  pressure.   3. Left atrial size was mild to moderately dilated.   4. Right atrial size was mildly dilated.   5. The mitral valve has been repaired/replaced.  Trivial mitral valve  regurgitation. The mean mitral valve gradient is 1.0 mmHg with average  heart  rate of 58 bpm. There is a prosthetic annuloplasty ring present in  the mitral position. Procedure Date:  2002.   6. The aortic valve is tricuspid. Aortic valve regurgitation is not  visualized. Mild aortic valve sclerosis is present, with no evidence of  aortic valve stenosis.   7. Aortic dilatation noted. There is mild dilatation of the aortic root,  measuring 39 mm. There is mild dilatation of the ascending aorta,  measuring 41 mm.   Assessment and Plan:  1. Paroxysmal afib This is the first brief  episode since ablation in 2015 Possibly Covid related?  In SR today Continue metoprolol 12.5 mg daily This could possibly be increased if needed to control afib  Advised to stop alcohol use  To discuss obtaining  a sleep study with PCP   2. CHA2DS2VASc  score of 2 He has been told in the past to avoid anticoagulation because of several aneurysms and prior bleeds This is followed at [facility] and he is due to have repeat scans soon  I discussed that is it not feasible to discuss advanced means to control afib such as  cardioversion, antiarrythmic's or repeat ablation if not on anticoagulation  He could possibly be a Watchman candidate but would need the input of the vascular MD's at [facility] to see if he could tolerate anticoagulation  for short term He will discuss this with them on f/u   Butch Penny C. Thales Knipple, [facility] [address] [place], [place] [address] [phone]

## 2020-11-03 NOTE — Telephone Encounter (Signed)
Pt and wife dropped off some BP recordings for Dr. Johney Frame, as she requested the pt to do at last OV.  Below are recordings:  10/24/20--  1145- 112/67 HR-73                8:30 PM 136-78 HR-82  10/25/20-- 9:00 PM- 108/60 HR-74  10/26/20--12:45 PM--121/68  HR-74             10 PM   117/72 HR-71  10/27/20--9:15 PM--103/56 HR-61  10/28/20--0930--104/62  HR-74            9:20 PM--114/60 HR-74  10/29/20--0830- 128/70 HR-63             Pt noted on BP recording sheet that was dropped off, that on Monday 8/8 he went to the ER around midnight for rapid HR 140 bpm.  Pt was seen then, and was diagnosed with afib.  He endorsed he converted to NSR during ER stay and was monitored for 2 hours thereafter to make sure he maintained sinus rhythm.  Pt was then referred to our afib clinic to be seen.  He saw afib clinic on today 11/03/20.  Will route this message to Dr. Jacolyn Reedy in-basket for covering Cardiologist to review (Dr. Johney Frame out on maternity leave).

## 2020-11-06 NOTE — Telephone Encounter (Signed)
Arnie, [place]" - 11/03/2020  5:32 PM Werner Lean, MD  Sent: Sun November 05, 2020  9:54 AM  To: Nuala Alpha, LPN   Message  BP and Heart Rate WNL.  No changes at this time

## 2020-11-06 NOTE — Telephone Encounter (Signed)
Spoke with the pt and made him aware that Dr. Gasper Sells (covering for Dr. Johney Frame) reviewed his HR/BP recordings, and said all is WNL, no further changes to be made at this time. Pt verbalized understanding and agrees with this plan.

## 2020-11-07 DIAGNOSIS — Z85850 Personal history of malignant neoplasm of thyroid: Secondary | ICD-10-CM | POA: Diagnosis not present

## 2020-11-07 DIAGNOSIS — J383 Other diseases of vocal cords: Secondary | ICD-10-CM | POA: Diagnosis not present

## 2020-11-07 DIAGNOSIS — J384 Edema of larynx: Secondary | ICD-10-CM | POA: Diagnosis not present

## 2020-11-07 DIAGNOSIS — R1310 Dysphagia, unspecified: Secondary | ICD-10-CM | POA: Diagnosis not present

## 2020-11-07 DIAGNOSIS — J3801 Paralysis of vocal cords and larynx, unilateral: Secondary | ICD-10-CM | POA: Diagnosis not present

## 2020-11-13 DIAGNOSIS — L821 Other seborrheic keratosis: Secondary | ICD-10-CM | POA: Diagnosis not present

## 2020-11-13 DIAGNOSIS — L570 Actinic keratosis: Secondary | ICD-10-CM | POA: Diagnosis not present

## 2020-11-13 DIAGNOSIS — L814 Other melanin hyperpigmentation: Secondary | ICD-10-CM | POA: Diagnosis not present

## 2020-11-13 DIAGNOSIS — Z85820 Personal history of malignant melanoma of skin: Secondary | ICD-10-CM | POA: Diagnosis not present

## 2020-11-13 DIAGNOSIS — D225 Melanocytic nevi of trunk: Secondary | ICD-10-CM | POA: Diagnosis not present

## 2020-11-13 DIAGNOSIS — D2361 Other benign neoplasm of skin of right upper limb, including shoulder: Secondary | ICD-10-CM | POA: Diagnosis not present

## 2020-11-13 DIAGNOSIS — D1801 Hemangioma of skin and subcutaneous tissue: Secondary | ICD-10-CM | POA: Diagnosis not present

## 2020-11-14 ENCOUNTER — Other Ambulatory Visit: Payer: Self-pay

## 2020-11-14 ENCOUNTER — Inpatient Hospital Stay: Payer: Medicare Other | Attending: Hematology and Oncology

## 2020-11-14 DIAGNOSIS — Z452 Encounter for adjustment and management of vascular access device: Secondary | ICD-10-CM | POA: Insufficient documentation

## 2020-11-14 DIAGNOSIS — C8318 Mantle cell lymphoma, lymph nodes of multiple sites: Secondary | ICD-10-CM

## 2020-11-14 MED ORDER — HEPARIN SOD (PORK) LOCK FLUSH 100 UNIT/ML IV SOLN
500.0000 [IU] | Freq: Once | INTRAVENOUS | Status: AC
  Administered 2020-11-14: 500 [IU]

## 2020-11-14 MED ORDER — SODIUM CHLORIDE 0.9% FLUSH
10.0000 mL | Freq: Once | INTRAVENOUS | Status: AC
  Administered 2020-11-14: 10 mL

## 2020-11-15 ENCOUNTER — Ambulatory Visit (INDEPENDENT_AMBULATORY_CARE_PROVIDER_SITE_OTHER): Payer: Medicare Other | Admitting: Neurology

## 2020-11-15 ENCOUNTER — Encounter: Payer: Self-pay | Admitting: Neurology

## 2020-11-15 VITALS — BP 117/69 | HR 72 | Ht 73.0 in | Wt 215.5 lb

## 2020-11-15 DIAGNOSIS — I480 Paroxysmal atrial fibrillation: Secondary | ICD-10-CM

## 2020-11-15 DIAGNOSIS — I714 Abdominal aortic aneurysm, without rupture, unspecified: Secondary | ICD-10-CM

## 2020-11-15 DIAGNOSIS — G2581 Restless legs syndrome: Secondary | ICD-10-CM

## 2020-11-15 DIAGNOSIS — Z77098 Contact with and (suspected) exposure to other hazardous, chiefly nonmedicinal, chemicals: Secondary | ICD-10-CM

## 2020-11-15 DIAGNOSIS — G6289 Other specified polyneuropathies: Secondary | ICD-10-CM | POA: Diagnosis not present

## 2020-11-15 DIAGNOSIS — C8318 Mantle cell lymphoma, lymph nodes of multiple sites: Secondary | ICD-10-CM | POA: Diagnosis not present

## 2020-11-15 MED ORDER — BUPROPION HCL ER (XL) 150 MG PO TB24: 150.0000 mg | ORAL_TABLET | Freq: Every day | ORAL | 3 refills | Status: DC

## 2020-11-15 NOTE — Patient Instructions (Addendum)
There are well-accepted and sensible ways to reduce risk for Alzheimers disease and other degenerative brain disorders .  Exercise Daily Walk A daily 20 minute walk should be part of your routine. Disease related apathy can be a significant roadblock to exercise and the only way to overcome this is to make it a daily routine and perhaps have a reward at the end (something your loved one loves to eat or drink perhaps) or a personal trainer coming to the home can also be very useful. Most importantly, the patient is much more likely to exercise if the caregiver / spouse does it with him/her. In general a structured, repetitive schedule is best.  General Health: Any diseases which effect your body will effect your brain such as a pneumonia, urinary infection, blood clot, heart attack or stroke. Keep contact with your primary care doctor for regular follow ups.  Sleep. A good nights sleep is healthy for the brain. Seven hours is recommended. If you have insomnia or poor sleep habits we can give you some instructions. If you have sleep apnea wear your mask.  Diet: Eating a heart healthy diet is also a good idea; fish and poultry instead of red meat, nuts (mostly non-peanuts), vegetables, fruits, olive oil or canola oil (instead of butter), minimal salt (use other spices to flavor foods), whole grain rice, bread, cereal and pasta and wine in moderation.Research is now showing that the MIND diet, which is a combination of The Mediterranean diet and the DASH diet, is beneficial for cognitive processing and longevity. Information about this diet can be found in The MIND Diet, a book by Maggie Moon, MS, RDN, and online at https://www.healthline.com/nutrition/mind-diet  Finances, Power of Attorney and Advance Directives: You should consider putting legal safeguards in place with regard to financial and medical decision making. While the spouse always has power of attorney for medical and financial issues in the  absence of any form, you should consider what you want in case the spouse / caregiver is no longer around or capable of making decisions.   The Alzheimers Association Position on Disease Prevention  Can Alzheimer's be prevented? It's a question that continues to intrigue researchers and fuel new investigations. There are no clear-cut answers yet -- partially due to the need for more large-scale studies in diverse populations -- but promising research is under way. The Alzheimer's Association is leading the worldwide effort to find a treatment for Alzheimer's, delay its onset and prevent it from developing.   What causes Alzheimer's? Experts agree that in the vast majority of cases, Alzheimer's, like other common chronic conditions, probably develops as a result of complex interactions among multiple factors, including age, genetics, environment, lifestyle and coexisting medical conditions. Although some risk factors -- such as age or genes -- cannot be changed, other risk factors -- such as high blood pressure and lack of exercise -- usually can be changed to help reduce risk. Research in these areas may lead to new ways to detect those at highest risk.  Prevention studies A small percentage of people with Alzheimer's disease (less than 1 percent) have an early-onset type associated with genetic mutations. Individuals who have these genetic mutations are guaranteed to develop the disease. An ongoing clinical trial conducted by the [facility] ([facility]), is testing whether antibodies to beta-amyloid can reduce the accumulation of beta-amyloid plaque in the brains of people with such genetic mutations and thereby reduce, delay or prevent symptoms. Participants in the trial are receiving antibodies (  or placebo) before they develop symptoms, and the development of beta-amyloid plaques is being monitored by brain scans and other tests.  Another clinical trial, known as the A4 trial  (Anti-Amyloid Treatment in Asymptomatic Alzheimer's), is testing whether antibodies to beta-amyloid can reduce the risk of Alzheimer's disease in older people (ages 65 to 85) at high risk for the disease. The A4 trial is being conducted by the [facility]'s [facility] Study.  Though research is still evolving, evidence is strong that people can reduce their risk by making key lifestyle changes, including participating in regular activity and maintaining good heart health. Based on this research, the Alzheimer's Association offers 10 Ways to Love Your Brain -- a collection of tips that can reduce the risk of cognitive decline.  Heart-head connection  New research shows there are things we can do to reduce the risk of mild cognitive impairment and dementia.  Several conditions known to increase the risk of cardiovascular disease -- such as high blood pressure, diabetes and high cholesterol -- also increase the risk of developing Alzheimer's. Some autopsy studies show that as many as 80 percent of individuals with Alzheimer's disease also have cardiovascular disease.  A longstanding question is why some people develop hallmark Alzheimer's plaques and tangles but do not develop the symptoms of Alzheimer's. Vascular disease may help researchers eventually find an answer. Some autopsy studies suggest that plaques and tangles may be present in the brain without causing symptoms of cognitive decline unless the brain also shows evidence of vascular disease. More research is needed to better understand the link between vascular health and Alzheimer's.  Physical exercise and diet Regular physical exercise may be a beneficial strategy to lower the risk of Alzheimer's and vascular dementia. Exercise may directly benefit brain cells by increasing blood and oxygen flow in the brain. Because of its known cardiovascular benefits, a medically approved exercise program is a valuable part of any overall wellness  plan.  Current evidence suggests that heart-healthy eating may also help protect the brain. Heart-healthy eating includes limiting the intake of sugar and saturated fats and making sure to eat plenty of fruits, vegetables, and whole grains. No one diet is best. Two diets that have been studied and may be beneficial are the DASH (Dietary Approaches to Stop Hypertension) diet and the Mediterranean diet. The DASH diet emphasizes vegetables, fruits and fat-free or low-fat dairy products; includes whole grains, fish, poultry, beans, seeds, nuts and vegetable oils; and limits sodium, sweets, sugary beverages and red meats. A Mediterranean diet includes relatively little red meat and emphasizes whole grains, fruits and vegetables, fish and shellfish, and nuts, olive oil and other healthy fats.  Social connections and intellectual activity A number of studies indicate that maintaining strong social connections and keeping mentally active as we age might lower the risk of cognitive decline and Alzheimer's. Experts are not certain about the reason for this association. It may be due to direct mechanisms through which social and mental stimulation strengthen connections between nerve cells in the brain.  Head trauma There appears to be a strong link between future risk of Alzheimer's and serious head trauma, especially when injury involves loss of consciousness. You can help reduce your risk of Alzheimer's by protecting your head.  Wear a seat belt  Use a helmet when participating in sports  "Fall-proof" your home   What you can do now While research is not yet conclusive, certain lifestyle choices, such as physical activity and diet, may help support brain   health and prevent Alzheimer's. Many of these lifestyle changes have been shown to lower the risk of other diseases, like heart disease and diabetes, which have been linked to Alzheimer's. With few drawbacks and plenty of known benefits, healthy lifestyle  choices can improve your health and possibly protect your brain.  Learn more about brain health. You can help increase our knowledge by considering participation in a clinical study. Our free clinical trial matching services, Trial[employer], can help you find clinical trials in your area that are seeking volunteers.  Understanding prevention research Here are some things to keep in mind about the research underlying much of our current knowledge about possible prevention:  Insights about potentially modifiable risk factors apply to large population groups, not to individuals. Studies can show that factor X is associated with outcome Y, but cannot guarantee that any specific person will have that outcome. As a result, you can "do everything right" and still have a serious health problem or "do everything wrong" and live to be 100.  Much of our current evidence comes from large epidemiological studies such as the [place] Aging Study, the Nurses' Health Study, the Adult Changes in Thought Study and the [employer] These studies explore pre-existing behaviors and use statistical methods to relate those behaviors to health outcomes. This type of study can show an "association" between a factor and an outcome but cannot "prove" cause and effect. This is why we describe evidence based on these studies with such language as "suggests," "may show," "might protect," and "is associated with."  The gold standard for showing cause and effect is a clinical trial in which participants are randomly assigned to a prevention or risk management strategy or a control group. Researchers follow the two groups over time to see if their outcomes differ significantly.  It is unlikely that some prevention or risk management strategies will ever be tested in randomized trials for ethical or practical reasons. One example is exercise. Definitively testing the impact of exercise on Alzheimer's risk would require a huge  trial enrolling thousands of people and following them for many years. The expense and logistics of such a trial would be prohibitive, and it would require some people to go without exercise, a known health benefit.  Atrial Fibrillation  Atrial fibrillation is a type of irregular or rapid heartbeat (arrhythmia). In atrial fibrillation, the top part of the heart (atria) beats in an irregular pattern. This makes the heart unable to pump bloodnormally and effectively. The goal of treatment is to prevent blood clots from forming, control your heart rate, or restore your heartbeat to a normal rhythm. If this condition is not treated, it can cause serious problems, such as a weakened heart muscle (cardiomyopathy) or a stroke. What are the causes? This condition is often caused by medical conditions that damage the heart's electrical system. These include: High blood pressure (hypertension). This is the most common cause. Certain heart problems or conditions, such as heart failure, coronary artery disease, heart valve problems, or heart surgery. Diabetes. Overactive thyroid (hyperthyroidism). Obesity. Chronic kidney disease. In some cases, the cause of this condition is not known. What increases the risk? This condition is more likely to develop in: Older people. People who smoke. Athletes who do endurance exercise. People who have a family history of atrial fibrillation. Men. People who use drugs. People who drink a lot of alcohol. People who have lung conditions, such as emphysema, pneumonia, or COPD. People who have obstructive sleep apnea. What are the  signs or symptoms? Symptoms of this condition include: A feeling that your heart is racing or beating irregularly. Discomfort or pain in your chest. Shortness of breath. Sudden light-headedness or weakness. Tiring easily during exercise or activity. Fatigue. Syncope (fainting). Sweating. In some cases, there are no symptoms. How is  this diagnosed? Your health care provider may detect atrial fibrillation when taking your pulse. If detected, this condition may be diagnosed with: An electrocardiogram (ECG) to check electrical signals of the heart. An ambulatory cardiac monitor to record your heart's activity for a few days. A transthoracic echocardiogram (TTE) to create pictures of your heart. A transesophageal echocardiogram (TEE) to create even closer pictures of your heart. A stress test to check your blood supply while you exercise. Imaging tests, such as a CT scan or chest X-ray. Blood tests. How is this treated? Treatment depends on underlying conditions and how you feel when you experience atrial fibrillation. This condition may be treated with: Medicines to prevent blood clots or to treat heart rate or heart rhythm problems. Electrical cardioversion to reset the heart's rhythm. A pacemaker to correct abnormal heart rhythm. Ablation to remove the heart tissue that sends abnormal signals. Left atrial appendage closure to seal the area where blood clots can form. In some cases, underlying conditions will be treated. Follow these instructions at home: Medicines Take over-the counter and prescription medicines only as told by your health care provider. Do not take any new medicines without talking to your health care provider. If you are taking blood thinners: Talk with your health care provider before you take any medicines that contain aspirin or NSAIDs, such as ibuprofen. These medicines increase your risk for dangerous bleeding. Take your medicine exactly as told, at the same time every day. Avoid activities that could cause injury or bruising, and follow instructions about how to prevent falls. Wear a medical alert bracelet or carry a card that lists what medicines you take. Lifestyle     Do not use any products that contain nicotine or tobacco, such as cigarettes, e-cigarettes, and chewing tobacco. If you  need help quitting, ask your health care provider. Eat heart-healthy foods. Talk with a dietitian to make an eating plan that is right for you. Exercise regularly as told by your health care provider. Do not drink alcohol. Lose weight if you are overweight. Do not use drugs, including cannabis. General instructions If you have obstructive sleep apnea, manage your condition as told by your health care provider. Do not use diet pills unless your health care provider approves. Diet pills can make heart problems worse. Keep all follow-up visits as told by your health care provider. This is important. Contact a health care provider if you: Notice a change in the rate, rhythm, or strength of your heartbeat. Are taking a blood thinner and you notice more bruising. Tire more easily when you exercise or do heavy work. Have a sudden change in weight. Get help right away if you have:  Chest pain, abdominal pain, sweating, or weakness. Trouble breathing. Side effects of blood thinners, such as blood in your vomit, stool, or urine, or bleeding that cannot stop. Any symptoms of a stroke. "BE FAST" is an easy way to remember the main warning signs of a stroke: B - Balance. Signs are dizziness, sudden trouble walking, or loss of balance. E - Eyes. Signs are trouble seeing or a sudden change in vision. F - Face. Signs are sudden weakness or numbness of the face, or the face  or eyelid drooping on one side. A - Arms. Signs are weakness or numbness in an arm. This happens suddenly and usually on one side of the body. S - Speech. Signs are sudden trouble speaking, slurred speech, or trouble understanding what people say. T - Time. Time to call emergency services. Write down what time symptoms started. Other signs of a stroke, such as: A sudden, severe headache with no known cause. Nausea or vomiting. Seizure. These symptoms may represent a serious problem that is an emergency. Do not wait to see if the  symptoms will go away. Get medical help right away. Call your local emergency services (911 in the [place] Do not drive yourself to the hospital. Summary Atrial fibrillation is a type of irregular or rapid heartbeat (arrhythmia). Symptoms include a feeling that your heart is beating fast or irregularly. You may be given medicines to prevent blood clots or to treat heart rate or heart rhythm problems. Get help right away if you have signs or symptoms of a stroke. Get help right away if you cannot catch your breath or have chest pain or pressure. This information is not intended to replace advice given to you by your health care provider. Make sure you discuss any questions you have with your healthcare provider. Document Revised: 09/02/2018 Document Reviewed: 09/02/2018 [employer] Patient Education  [employer].

## 2020-11-15 NOTE — Progress Notes (Signed)
SLEEP MEDICINE CLINIC   Provider:  Larey Seat, M D  Primary Care Physician:  [name], MD   Referring Provider: [name], MD   Chief Complaint  Patient presents with   Follow-up    Rm 11, alone. Here for 6 month f/u. Pt was seen at the ER for afib on 10/30/20. Pt is requesting a repeat his SS, to confirm her doesn't have sleep apnea and could trigger afib.      I have the pleasure of seeing Sean Stanley again on 05-17-2020-  A 80 year old  Caucasian gentleman, who had a another rough year.  His neuropathy is progression, from the toes ascending above the ankle, pain keeps him awake at night. 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 In February 2020 left neck node lymph node excision for biopsy diagnosed with mantle cell B non-Hodgkin's lymphoma treated here locally at [facility] lung cancer center has a Port-A-Cath has been on rituximab a drug we are very familiar with the neurology as well.  July 2020 completion of his chemotherapy and now considered in remission.  In July 2021 he was still in remission, had a port- a -cath removed. [place], [place]- treatable , not cureable.   He takes Tylenol and CBD oil skin treatment. Tried Voltaren - he is only in pain at rest and not while up and about. Left side worse then the right. Creepy crawly sensation- RLS ? He massages it to get relief.    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  EMG evaluation of the right lower extremity shows chronic stable distal signs of neuropathic denervation consistent with the diagnosis of peripheral neuropathy.  There is no evidence of an overlying lumbosacral radiculopathy. 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  Cataract surgeries in 2018 . In February 2020 left neck node lymph node excision for biopsy diagnosed with mantle cell B non-Hodgkin's lymphoma treated here locally at [facility] lung cancer center has a Port-A-Cath has been on rituximab a drug we are very familiar with the neurology as well.  July 2020 completion of his chemotherapy and now considered in remission.   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  However he is speaking portable and in this quiet room I can hear him very well.  In May of this year he had to take bite and was treated preventively with doxycycline and in October 2021 he is planned to have an enlarged prostate procedure.  He also had a [facility] directed exam for high-frequency hearing loss.  Medication list is mildly unchanged.  He applies EMLA to the affected area, he  has to supplement thyroid hormone now with Synthroid 175 mcg daily he has dry eyes until he takes polyethylene glycol appropriately.  He is on Wellbutrin and this works out well.  Acetaminophen as needed Zetia at bedtime, Proscar at bedtime.  Has difficulties sleeping. 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 He is reluctant to speak as people lean in to hear him, and he  Worries about Covid.  Mother had parkinson's disease.     Video visit 08-2018  History of Present Illness: patient with neuropathy and recently diagnosed Lymphoma, having success with neuropathy control by walking regularly, only needing occasionally tylenol.  Observations/Objective: reports improvement in sleep duration and quality. Patient presents well groomed, in no acute distress.  Sean Stanley is a 80 year old Caucasian right-handed gentleman with an underlying medical history of non-Hodgkin lymphoma, neuropathy, he was evaluated for sleep apnea which was mild and strictly positional without associated hypoxemia and there were no PLM's.  Yet his neuropathy has kept him from sleeping well and he has reported very fragmented sleep.  Dr. 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  This is a neuropathy form that can be seen with agent orange exposure, which the patient had during the [place] War.  This neuropathy has ascended over the last 25 years slowly but steadily.     RV 04-30-2018, Sean Stanley is a 80 y.o. male , seen here as in a referral  from [name] and upon recommendation of [name] for evaluation of neuropathy. Neuropathy has impaired his sleep. He is relieved to hear that his apnea was strictly positional, no hypoxemia and no PLMs. He had nocturia and is followed by Dr Marveen Reeks. He has just heard from his ENT physician that he has Non- Hodkins Lymphoma, a neck lymph node, which was felt and found enlarged by CT. [name]. This is a shock. He had a MOCA today, 26/ 30 points.       HPI:  Sean Stanley is a 80 y.o. male , seen here as in a referral  from [name] and upon recommendation of [name] for evaluation of neuropathy.  Neuropathy has impaired his sleep. He is relieved to hear that his apnea was strictly positional, no hypoxemia and no PLMs. He had nocturia and is followed by Dr Marveen Reeks.   Today's 28 October 2017, and that he was summarized the patient's  baseline polysomnography on 12 September 2017.  The apnea hypopnea index was 18.2, in supine position 75.8 apneas per hour, in nonsupine position 2.7/h.  There was no REM exenteration.  There was no evidence of hypoxemia time spent below 89% oxygen saturation was 0.1-minute.  The patient had only 5 periodic limb movements but many spontaneous arousals that may have been pain or discomfort related, the EKG was not keeping with a sinus rhythm he had some intermittent PACs and PVCs.  Facet the patient would not require CPAP intervention if he could train him to sleep on his side.  We discussed the tennis ball method or other behavior changing methods to help him avoid supine sleep.  I would also mentioned to say that he will sleep better once he is entrained into the sleep position.   His wife was also interested in the neuropathy work up- he has only had a low Vit D level, no autoimmune evidence. His EMG was interrepted by Dr Jannifer Franklin. No radiculopathy- he has sensory issues, pain and  feels numb in both feet.  He likes the foot spa, jet massage . Wearing supportive shoes, treating pain.  Using trazodone for sleep- avoiding anticholinergic agents.  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 Wife asked about hemp topical oils.   I have the pleasure of seeing Mr. Silerio as a new patient to mostly practice on 23 Jul 2017.  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  About 25 years ago he began noticing numbness in his toes which from their ascended to the lower extremities.  It has not affected the upper extremities.  He does not have an associated skin rash.  He does not have raynaudes syndrome.  He has noted that when he is physically more active and more on his feet he will have more pain at night and  sometimes also has muscle spasms in the lower extremities especially the gastrocnemius, described as a charley horse. He has been woken by these cramps.  It comes and goes, not lasting long but very, very painful.He is of [place] ancestry, [place].    Chief complaint according to patient : sleep disorder, neuropathy, agent orange exposure.  Sleep habits are as follows: The patient's bedtime is between 10 and 10:30 PM, the bedroom is cool, quiet dark. He sleeps within 15 minutes, but wakes up for 2-3 times due to nocturia. He has increased exercise recently, feels this improved his sleep time. Fragmented sleep, total  Sleep time of 5 hours in a good night. Naps in daytime whenever he sits still. Wakes not refreshed, excessive daytime sleepiness. Wife stated he snores. -Was placed once on CPAP but quit using it. No family history of insomnia or neuropathy.   Sleep medical history and family sleep history:   Atrial fibrillation,  Dr. Esau Grew. cardioablation after many cardioversions has held up! mother had parkinson's disease. No family history of NP , no genetic testing.  Had Agent orange exposure.  This can be a VA claim .      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  No smoking history, ETOH : drinks hard liquor, now only wine . Caffeine ; 1 cup in PM, decaffeinated- soda and no iced teas.     Review of Systems: Out of a complete 14 system review, the patient complains of only the following symptoms, and all other reviewed systems are negative.  Insomnia, painful neuropathy, thyroid cancer , Lymphoma.    Social History   Socioeconomic History   Marital status: Married    Spouse name: [name]   Number of children: 2   Years of education: Not on file   Highest education level: Not on file  Occupational History   Occupation: retired Hydrologist  Tobacco Use   Smoking status: Never   Smokeless tobacco: Never  Vaping Use    Vaping Use: Never used  Substance and Sexual Activity   Alcohol use: Not Currently    Comment: social   Drug use: No   Sexual activity: Not on file  Other Topics Concern   Not on file  Social History Narrative   Lives in [place], Retired   Science writer Determinants of Radio broadcast assistant Strain: Not on [employer] Insecurity: Not on file  Transportation Needs: Not on file  Physical Activity: Not on file  Stress: Not on file  Social Connections: Not on file  Intimate Partner Violence: Not on file    Family History  Problem Relation Age of Onset   Parkinson's disease Mother 37   Heart failure Father 82   Cancer Maternal Grandmother    Heart attack Maternal Grandfather    Heart attack Paternal Grandmother    Heart attack Paternal Grandfather     Past Medical History:  Diagnosis Date   Abdominal aortic aneurysm, 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, IR could not access culprit lesion and treated with anticoag reversal; no AAA noted on 06/13/17 CTA   Aneurysm artery, 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[place])    followed at [facility]   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 [facility])    [facility]   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   s/p coiling 12/16/12 - [facility]   Atrial flutter ([facility])    BPH (benign prostatic hyperplasia)    Cancer ([place])    melanoma on lower right back and left chest - surgically removed and cleared   Dysrhythmia    H/O agent Orange exposure    Headache    migraine- not current   High bilirubin    pt states it's genetic   History of blood transfusion    Hypercholesteremia    Hypercholesterolemia    Lymphoma ([place]) 04/2018   Mitral valve disease    annuloplasty 2002 [facility]   Neuropathy    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[facility])    4.5 cm 03/2018 CT   Thyroid cancer [facility])     Past Surgical History:  Procedure Laterality Date   ABDOMINAL ANGIOGRAM  08/05/12   aneurysm repair     CARDIOVERSION  02/12/2012   Procedure: CARDIOVERSION;  Surgeon: Pixie Casino, MD;  Location: [facility] ENDOSCOPY;  Service: Cardiovascular;  Laterality: N/A;   CARDIOVERSION N/A 06/22/2013   Procedure: CARDIOVERSION;  Surgeon: [name], MD;  Location: [place];  Service: Cardiovascular;  Laterality: N/A;   CATARACT EXTRACTION Bilateral 2018   with lens implant   CHOLECYSTECTOMY     COLONOSCOPY     ESOPHAGOGASTRODUODENOSCOPY     IR IMAGING GUIDED PORT INSERTION  05/26/2018   LYMPH NODE BIOPSY Left 04/27/2018   Procedure: EXCISIONAL BIOPSY OF LEFT CERVICAL LYMPH NODE;  Surgeon: Melida Quitter, MD;  Location: [place];  Service: ENT;  Laterality: Left;   LYMPH NODE BIOPSY Left 11/23/2018   Procedure: LEFT CERVICAL LYMPH NODE OPEN BIOPSY;  Surgeon: Melida Quitter, MD;  Location: [place];  Service: ENT;  Laterality: Left;   MENISCUS REPAIR Right 2009   MITRAL VALVE REPAIR  2002   [facility]   NM MYOVIEW LTD  07/22/2006   no ischemia   RADICAL NECK DISSECTION Left 01/15/2019   Procedure: LEFT NECK DISSECTION;  Surgeon: Melida Quitter, MD;  Location: [place];  Service: ENT;  Laterality: Left;   RIGHT HEART CATH  06/19/2004   normal right heart dynamics. EF 50%   TEE WITHOUT CARDIOVERSION  02/12/2012   Procedure: TRANSESOPHAGEAL ECHOCARDIOGRAM (TEE);  Surgeon: Pixie Casino, MD;  Location: [place];  Service: Cardiovascular;  Laterality: N/A;  TEE WITHOUT CARDIOVERSION N/A 06/22/2013   Procedure: TRANSESOPHAGEAL ECHOCARDIOGRAM (TEE);  Surgeon: [name], MD;  Location: [place];  Service: Cardiovascular;  Laterality: N/A;   THYROIDECTOMY N/A 01/15/2019   Procedure: TOTAL THYROIDECTOMY;  Surgeon: Melida Quitter, MD;  Location: [facility] OR;  Service: ENT;  Laterality: N/A;    Current Outpatient Medications  Medication Sig  Dispense Refill   acetaminophen (TYLENOL) 500 MG tablet Take 1,000 mg by mouth daily as needed for moderate pain.     alfuzosin (UROXATRAL) 10 MG 24 hr tablet Take 10 mg by mouth at bedtime.      buPROPion (WELLBUTRIN XL) 150 MG 24 hr tablet Take 1 tablet (150 mg total) by mouth daily. 90 tablet 3   Cholecalciferol (VITAMIN D) 2000 units tablet Take 2,000 Units by mouth at bedtime.      ezetimibe (ZETIA) 10 MG tablet Take 10 mg by mouth at bedtime.     finasteride (PROSCAR) 5 MG tablet Take 5 mg by mouth at bedtime.      levothyroxine (SYNTHROID) 175 MCG tablet Take 1 tablet (175 mcg total) by mouth daily.     lidocaine-prilocaine (EMLA) cream Apply to affected area once before sleep time- 30 g 3   metoprolol succinate (TOPROL-XL) 25 MG 24 hr tablet Take 12.5 mg by mouth every evening.     Polyethyl Glycol-Propyl Glycol (SYSTANE HYDRATION PF OP) Place 1 drop into both eyes 3 (three) times daily as needed (dry eyes).      No current facility-administered medications for this visit.    Allergies as of 11/15/2020 - Review Complete 11/15/2020  Allergen Reaction Noted   Aspirin  04/24/2018   Levaquin [levofloxacin in d5w]  07/22/2017   Nickel Itching 07/22/2017   Other  11/19/2018   Simvastatin  08/19/2019   Statins Other (See Comments) 03/30/2013   Allopurinol Rash 06/17/2018   Ampicillin Rash 11/16/2012   Penicillin g Rash 08/19/2019    Vitals: BP 117/69   Pulse 72   Ht '6\' 1"'$  (1.854 m)   Wt 215 lb 8 oz (97.8 kg)   BMI 28.43 kg/m  Last Weight:  Wt Readings from Last 1 Encounters:  11/15/20 215 lb 8 oz (97.8 kg)   [id number] mass index is 28.43 kg/m.     Last Height:   Ht Readings from Last 1 Encounters:  11/15/20 '6\' 1"'$  (1.854 m)    Physical exam:  General: The patient is awake, alert and appears not in acute distress. The patient is well groomed. Head: Normocephalic, atraumatic. Neck is supple. Mallampati 2  neck circumference:17. 5 . Nasal airflow patent , very hoarse  voice.   Cardiovascular:  Regular rate and rhythm , without  murmurs or carotid bruit, and without distended neck veins. Status post mitral valve repair.  Respiratory: Lungs are clear to auscultation. Skin:  Without evidence of edema, or rash Trunk: BMI is 28.5. last 8 pounds.   The patient's posture is erect   Neurologic exam : The patient is awake and alert, oriented to place and time.   Attention span & concentration ability appears normal.  Speech is fluent,  with dysarthria, dysphonia .  Mood and affect are concerned.   Cranial nerves: No change in taste or smell.  Pupils are equal and reactive to light.Extraocular movements  in vertical and horizontal planes intact and without nystagmus. Visual fields by finger perimetry are intact. Hearing to finger rub intact.   Facial sensation intact to fine touch.  Facial motor strength is symmetric  and tongue and uvula move midline. Shoulder shrug was symmetrical.   Motor exam:  Normal tone, muscle bulk and symmetric strength in all extremities. Sensory:  Vibration completely lost below knee , ascending skin changes, dystrophy, hair loss, shiny skin.  Coordination: Rapid alternating movements in the fingers/hands was normal.  Finger-to-nose maneuver  normal without evidence of ataxia, dysmetria or tremor. Gait and station: Patient walks without assistive device and is able unassisted to climb up to the exam table. Strength within normal limits. Stance is stable and normal. He still turns with 3  Steps.  Deep tendon reflexes: in the  upper and lower extremities are symmetric and intact. Babinski maneuver response is  downgoing.   Assessment:   30 minutes - After physical and neurologic examination, review of laboratory studies,  Personal review of imaging studies, reports of other /same  Imaging studies, results of polysomnography and / or neurophysiology testing and pre-existing records as far as provided in visit., my assessment is    1)confirmed by NCV and EMG is his neuropathy with pain, Pin and needles, cramping and burning. Now clearly with associated skin dystrophy,skin is shiny and hairless- loss of DTR- all pulses palpable  can't feel vibration but temperature. RLS like - .this affects his sleep.  Neuropathy is ascending and axonal.   2)He has atrial fibrillation, does not drink, no caffeine, no chocolate- has a history of AAA, and of of' many lower retroperitoneal abdominal aneurysmata" . Had a bleed in may 2014. Was told not to get on anticoagulation.  One was coiled at [facility].  Worried about apnea in light of atrial fib. SOB, fatigue   3) concerned  about MCI.  Just borderline, short term memory , recall delayed,    Montreal Cognitive Assessment  11/15/2020 04/30/2018  Visuospatial/ Executive (0/5) 4 5  Naming (0/3) 3 3  Attention: Read list of digits (0/2) 2 2  Attention: Read list of letters (0/1) 1 1  Attention: Serial 7 subtraction starting at 100 (0/3) 3 3  Language: Repeat phrase (0/2) 2 2  Language : Fluency (0/1) 0 1  Abstraction (0/2) 2 2  Delayed Recall (0/5) 3 0  Orientation (0/6) 6 6  Total 26 25    The patient was advised of the nature of the diagnosed disorder , the treatment options and the  risks for general health and wellness arising from not treating the condition.   I spent more than 35 minutes of face to face time with the patient.  Greater than 50% of time was spent in counseling and coordination of care. We have discussed the diagnosis and differential and I answered the patient's questions.    Plan:  Treatment plan and additional workup :   Repeat an HST- patient concerned about atrial fibrillation and wakes form arrhythmia,  Wellbutrin in AM po.helps with neuropathy.  Lets add low dose Requip for RLS like creepy crawly sensation-  nocturnal pain,  Axonal Neuropathy- more stiffness. Its advancing- ascending. EMLA cream reordered.     RV after HST , 3 month  [place] , no new  MOCA needed until 2023.     Larey Seat, MD [id number], [id number] AM  Certified in Neurology by ABPN Certified in [place] by [facility] [address] [place], [place] [address]

## 2020-11-16 ENCOUNTER — Ambulatory Visit (INDEPENDENT_AMBULATORY_CARE_PROVIDER_SITE_OTHER): Payer: Medicare Other | Admitting: Pharmacist

## 2020-11-16 ENCOUNTER — Other Ambulatory Visit: Payer: Self-pay

## 2020-11-16 DIAGNOSIS — E785 Hyperlipidemia, unspecified: Secondary | ICD-10-CM

## 2020-11-16 DIAGNOSIS — I2510 Atherosclerotic heart disease of native coronary artery without angina pectoris: Secondary | ICD-10-CM

## 2020-11-16 NOTE — Patient Instructions (Addendum)
It was nice to meet you today  Your LDL cholesterol is 112, your goal is < 70 because of the calcifications noted on your chest scan  Work on a heart healthy diet and staying active  Continue ezetimibe (Zetia) each day for your cholesterol  We'll recheck fasting labs on Tuesday November 22  We can consider starting either a low dose of pravastatin (daily pill that lowers your LDL by about 30%) or an injection like Repatha that's given every 2 weeks (lowers your LDL by 60%)

## 2020-11-16 NOTE — Progress Notes (Signed)
Patient ID: Sean Stanley                 DOB: Feb 16, 1941                    MRN: [id number]     HPI: Sean Stanley is a pleasant 80 y.o. male patient referred to lipid clinic by Dr Gasper Sells. 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 TTE at [facility] 10/2019 showed mild LV dysfunction with LVEF 45-50%. Aortic and coronary atherosclerosis was noted on 05/2020 CT.  Pt currently takes ezetimibe which he is tolerating well. He has previously taken simvastatin and rosuvastatin, both of which caused muscle pain/chest discomfort after 3-6 months of therapy with a more gradual onset. Symptoms improved after statin discontinuation. Does not wish to take new medications if possible.  Current Medications: ezetimibe '10mg'$  daily Intolerances: simvastatin, rosuvastatin - muscle aches Risk Factors: coronary atherosclerosis noted on chest CT LDL goal: '70mg'$ /dL  Diet: Lives at [employer].  Likes fish, chicken, salads No fast food Breakfast - boiled egg, OJ, toast Avoids caffeine, alcohol and chocolate to avoid afib triggers  Exercise: aquatic exercises 3x per week, walking limited due to neuropathy. About 100 mins of activity per week  Family History: The patient's family history includes Cancer in his maternal grandmother; Heart attack in his maternal grandfather, paternal grandfather, and paternal grandmother; Heart failure (age of onset: 80) in his father; Parkinson's disease (age of onset: 20) in his mother.  Social History: [employer] vet. Has been married to retired Therapist, sports for 6 years. Denies tobacco and drug use, social alcohol use.  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   s/p coiling 12/16/12 - [facility]   Atrial flutter ([facility])    BPH (benign prostatic hyperplasia)    Cancer ([place])    melanoma on lower right back and left chest - surgically removed and cleared   Dysrhythmia    H/O agent Orange exposure    Headache    migraine- not current   High bilirubin    pt states it's genetic   History of blood transfusion    Hypercholesteremia    Hypercholesterolemia    Lymphoma ([place]) 04/2018   Mitral valve disease    annuloplasty 2002 [facility]   Neuropathy    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 Maxwell Caul told him he had improved   Pneumonia    Thoracic ascending aortic aneurysm ([place])    4.5 cm 03/2018 CT   Thyroid cancer [facility])     Current Outpatient Medications on File Prior to Visit  Medication Sig Dispense Refill   acetaminophen (TYLENOL) 500 MG tablet Take 1,000 mg by mouth  daily as needed for moderate pain.     alfuzosin (UROXATRAL) 10 MG 24 hr tablet Take 10 mg by mouth at bedtime.      buPROPion (WELLBUTRIN XL) 150 MG 24 hr tablet Take 1 tablet (150 mg total) by mouth daily. 90 tablet 3   Cholecalciferol (VITAMIN D) 2000 units tablet Take 2,000 Units by mouth at bedtime.      ezetimibe (ZETIA) 10 MG tablet Take 10 mg by mouth at bedtime.     finasteride (PROSCAR) 5 MG tablet Take 5 mg by mouth at bedtime.      levothyroxine (SYNTHROID) 175 MCG tablet Take 1 tablet (175 mcg total) by mouth daily.     lidocaine-prilocaine (EMLA) cream Apply to affected area once before sleep time- 30 g 3   metoprolol  succinate (TOPROL-XL) 25 MG 24 hr tablet Take 12.5 mg by mouth every evening.     Polyethyl Glycol-Propyl Glycol (SYSTANE HYDRATION PF OP) Place 1 drop into both eyes 3 (three) times daily as needed (dry eyes).      No current facility-administered medications on file prior to visit.    Allergies  Allergen Reactions   Aspirin     Has been instructed not to take any blood thinners even aspirin due to history of several aneurysms    Levaquin [Levofloxacin In D5w]     tendonitis   Nickel Itching   Other     No blood thinners due to history of several aneurysms    Simvastatin    Statins Other (See Comments)    Muscle aches   Allopurinol Rash   Ampicillin Rash    Did it involve swelling of the face/tongue/throat, SOB, or low BP? No Did it involve sudden or severe rash/hives, skin peeling, or any reaction on the inside of your mouth or nose? Yes Did you need to seek medical attention at a hospital or doctor's office? Yes When did it last happen?      50+ years If all above answers are "NO", may proceed with cephalosporin use.    Penicillin G Rash    Assessment/Plan:  1. Hyperlipidemia - LDL 112 on ezetimibe '10mg'$  daily, aggressive LDL goal < 70 given aortic atherosclerosis noted on chest CT. He is intolerant to simvastatin and rosuvastatin (unknown doses). Discussed rechallenging with low dose pravastatin '20mg'$  daily or trying Repatha injections ($36/3 month supply with his [employer]). Pt prefers to avoid addition of new medication at this time and would like to focus on diet/exercise improvements. Will recheck lipids in 3 months and reassess at that time.  Ellice Boultinghouse E. Indria Bishara, PharmD, BCACP, [place] [id number] [address] [place], [place] [address] Phone: [phone]; Fax: [phone] 11/16/2020 9:48 AM

## 2020-11-20 ENCOUNTER — Ambulatory Visit (INDEPENDENT_AMBULATORY_CARE_PROVIDER_SITE_OTHER): Payer: Medicare Other | Admitting: Internal Medicine

## 2020-11-20 ENCOUNTER — Other Ambulatory Visit: Payer: Self-pay

## 2020-11-20 ENCOUNTER — Encounter (INDEPENDENT_AMBULATORY_CARE_PROVIDER_SITE_OTHER): Payer: Medicare Other | Admitting: Ophthalmology

## 2020-11-20 VITALS — BP 94/62 | HR 77 | Ht 73.0 in | Wt 218.0 lb

## 2020-11-20 DIAGNOSIS — I480 Paroxysmal atrial fibrillation: Secondary | ICD-10-CM

## 2020-11-20 NOTE — Patient Instructions (Addendum)
Medication Instructions:  Your physician recommends that you continue on your current medications as directed. Please refer to the Current Medication list given to you today.  Labwork: None ordered.  Testing/Procedures: None ordered.  Follow-Up: Your physician wants you to follow-up in: 02/26/21 at 10:45 am with  Thompson Grayer, MD    Any Other Special Instructions Will Be Listed Below (If Applicable).  If you need a refill on your cardiac medications before your next appointment, please call your pharmacy.

## 2020-11-20 NOTE — Progress Notes (Signed)
Electrophysiology Office Note   Date:  11/20/2020   ID:  Sean Stanley, Sean Stanley [birth year], MRN [id number]  PCP:  Sean Huddle, MD  Cardiologist:  Dr Johney Frame Primary Electrophysiologist: Thompson Grayer, MD    CC: atrial fibrillation   History of Present Illness: Sean Stanley is a 80 y.o. male who presents today for electrophysiology evaluation.  I have seen him previously in 2015 for atrial flutter, but not since.  He declined ablation then. He underwent minimally invasive MV repair at [facility] in 2002.  He did well but developed atrial flutter in 2014.  Reports suggest that he had an atrial flutter ablation (CTI) by Dr Lurene Shadow 07/27/2013.   He has had prior retroperitoneal bleeding and is no longer on [facility] therapy.  He has had multiple abdominal aortic aneurysms and is s/p coiling.  He is also followed by Dr Alvy Bimler for thyroid cancer and non-hodgkins lymphoma.  More recently, he was seen 10/31/2020 in the ER and found to have atrial fibrillation.  This was felt to have been covid related.  He was seen in the AF clinic 11/03/20 (note reviewed) and ETOh avoidance was advised.  Sleep study was ordered.  Today, he denies symptoms of palpitations, chest pain, shortness of breath, orthopnea, PND, lower extremity edema, claudication, dizziness, presyncope, syncope, bleeding, or neurologic sequela. The patient is tolerating medications without difficulties and is otherwise without complaint today.    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   s/p coiling 12/16/12 - [facility]   Atrial flutter ([facility])     BPH (benign prostatic hyperplasia)    Cancer ([place])    melanoma on lower right back and left chest - surgically removed and cleared   Dysrhythmia    H/O agent Orange exposure    Headache    migraine- not current   High 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 Maxwell Caul told him he had improved   Pneumonia    Thoracic ascending aortic aneurysm ([place])    4.5 cm 03/2018 CT   Thyroid cancer [facility])    Past Surgical History:  Procedure Laterality Date   ABDOMINAL ANGIOGRAM  08/05/12   aneurysm repair     CARDIOVERSION  02/12/2012   Procedure: CARDIOVERSION;  Surgeon: Pixie Casino, MD;  Location: [facility] ENDOSCOPY;  Service: Cardiovascular;  Laterality: N/A;   CARDIOVERSION N/A 06/22/2013   Procedure: CARDIOVERSION;  Surgeon: [name], MD;  Location: [place];  Service: Cardiovascular;  Laterality: N/A;   CATARACT EXTRACTION Bilateral 2018   with lens implant   CHOLECYSTECTOMY     COLONOSCOPY     ESOPHAGOGASTRODUODENOSCOPY     IR IMAGING GUIDED PORT INSERTION  05/26/2018  LYMPH NODE BIOPSY Left 04/27/2018   Procedure: EXCISIONAL BIOPSY OF LEFT CERVICAL LYMPH NODE;  Surgeon: Melida Quitter, MD;  Location: [place];  Service: ENT;  Laterality: Left;   LYMPH NODE BIOPSY Left 11/23/2018   Procedure: LEFT CERVICAL LYMPH NODE OPEN BIOPSY;  Surgeon: Melida Quitter, MD;  Location: [place];  Service: ENT;  Laterality: Left;   MENISCUS REPAIR Right 2009   MITRAL VALVE REPAIR  2002   [facility]   NM MYOVIEW LTD  07/22/2006   no ischemia   RADICAL NECK DISSECTION Left 01/15/2019   Procedure: LEFT NECK DISSECTION;  Surgeon: Melida Quitter, MD;  Location: [place];  Service: ENT;  Laterality: Left;   RIGHT HEART CATH   06/19/2004   normal right heart dynamics. EF 50%   TEE WITHOUT CARDIOVERSION  02/12/2012   Procedure: TRANSESOPHAGEAL ECHOCARDIOGRAM (TEE);  Surgeon: Pixie Casino, MD;  Location: [facility] ENDOSCOPY;  Service: Cardiovascular;  Laterality: N/A;   TEE WITHOUT CARDIOVERSION N/A 06/22/2013   Procedure: TRANSESOPHAGEAL ECHOCARDIOGRAM (TEE);  Surgeon: [name], MD;  Location: [place];  Service: Cardiovascular;  Laterality: N/A;   THYROIDECTOMY N/A 01/15/2019   Procedure: TOTAL THYROIDECTOMY;  Surgeon: Melida Quitter, MD;  Location: [facility] OR;  Service: ENT;  Laterality: N/A;     Current Outpatient Medications  Medication Sig Dispense Refill   acetaminophen (TYLENOL) 500 MG tablet Take 1,000 mg by mouth daily as needed for moderate pain.     alfuzosin (UROXATRAL) 10 MG 24 hr tablet Take 10 mg by mouth at bedtime.      buPROPion (WELLBUTRIN XL) 150 MG 24 hr tablet Take 1 tablet (150 mg total) by mouth daily. 90 tablet 3   Cholecalciferol (VITAMIN D) 2000 units tablet Take 2,000 Units by mouth at bedtime.      ezetimibe (ZETIA) 10 MG tablet Take 10 mg by mouth every morning.     finasteride (PROSCAR) 5 MG tablet Take 5 mg by mouth at bedtime.      levothyroxine (SYNTHROID) 175 MCG tablet Take 1 tablet (175 mcg total) by mouth daily.     lidocaine-prilocaine (EMLA) cream Apply to affected area once before sleep time- 30 g 3   metoprolol succinate (TOPROL-XL) 25 MG 24 hr tablet Take 12.5 mg by mouth every evening.     Polyethyl Glycol-Propyl Glycol (SYSTANE HYDRATION PF OP) Place 1 drop into both eyes 3 (three) times daily as needed (dry eyes).      No current facility-administered medications for this visit.    Allergies:   Aspirin, Levaquin [levofloxacin in d5w], Nickel, Other, Simvastatin, Statins, Allopurinol, Ampicillin, and Penicillin g   Social History:  The patient  reports that he has never smoked. He has never used smokeless tobacco. He reports that he does not currently use alcohol.  He reports that he does not use drugs.   Family History:  The patient's family history includes Cancer in his maternal grandmother; Heart attack in his maternal grandfather, paternal grandfather, and paternal grandmother; Heart failure (age of onset: 51) in his father; Parkinson's disease (age of onset: 26) in his mother.    ROS:  Please see the history of present illness.   All other systems are personally reviewed and negative.    PHYSICAL EXAM: VS:  BP 94/62   Pulse 77   Ht '6\' 1"'$  (1.854 m)   Wt 218 lb (98.9 kg)   SpO2 96%   BMI 28.76 kg/m  , BMI Body mass index is 28.76 kg/m. GEN: Well nourished, well  developed, in no acute distress HEENT: normal Neck: no JVD, carotid bruits, or masses Cardiac: RRR; no murmurs, rubs, or gallops,no edema  Respiratory:  clear to auscultation bilaterally, normal work of breathing GI: soft, nontender, nondistended, + BS MS: no deformity or atrophy Skin: warm and dry  Neuro:  Strength and sensation are intact Psych: euthymic mood, full affect  EKG:  EKG is ordered today. The ekg ordered today is personally reviewed and shows sinus rhythm with PVCs   Recent Labs: 10/31/2020: ALT 16; B Natriuretic Peptide 154.1; BUN 24; Creatinine, Ser 1.16; Hemoglobin 12.7; Platelets 190; Potassium 3.9; Sodium 141  personally reviewed   Lipid Panel     Component Value Date/Time   CHOL 184 10/26/2020 0927   TRIG 111 10/26/2020 0927   HDL 52 10/26/2020 0927   CHOLHDL 3.5 10/26/2020 0927   CHOLHDL 3.2 02/22/2019 0757   VLDL 23 02/22/2019 0757   LDLCALC 112 (H) 10/26/2020 0927   personally reviewed   Wt Readings from Last 3 Encounters:  11/20/20 218 lb (98.9 kg)  11/15/20 215 lb 8 oz (97.8 kg)  11/03/20 216 lb (98 kg)      Other studies personally reviewed: Additional studies/ records that were reviewed today include: AF clinic notes, Dr Horald Pollen notes, my prior notes  Review of the above records today demonstrates: as above   ASSESSMENT AND  PLAN:  1.  Atrial fibrillation Ekg 10/31/20 (personally reviewed) confirms afib. Chads2vasc score is at least 3 (vascular dz, age).   He had a single episode of afib in the setting of covid/ paxlovid.  I would not advise initiation on [place] at this time. He has had bleeding issues with [place] previously. Given prior MV repair, it is possible that he has had LAA oversewn.  Does not have atrial clip by CXR.  I will try to obtain records from [place] from 2002 to see if surgical closer of LAA was performed. We discussed options of [place], ILR to further evaluate for AF, Apple Watch to follow for afib, and also REACT AF trial as options. For now, he would prefer a conservative approach.  He will obtain an Apple Watch and follow conservatively.  Return to see me in 3 months    Signed, Thompson Grayer, MD  11/20/2020 11:36 AM     [facility] [address] [place] [address] [phone] (office) [phone] (fax)

## 2020-11-23 DIAGNOSIS — K922 Gastrointestinal hemorrhage, unspecified: Secondary | ICD-10-CM | POA: Diagnosis not present

## 2020-11-23 DIAGNOSIS — I728 Aneurysm of other specified arteries: Secondary | ICD-10-CM | POA: Diagnosis not present

## 2020-11-23 DIAGNOSIS — I480 Paroxysmal atrial fibrillation: Secondary | ICD-10-CM | POA: Diagnosis not present

## 2020-11-23 DIAGNOSIS — I712 Thoracic aortic aneurysm, without rupture: Secondary | ICD-10-CM | POA: Diagnosis not present

## 2020-11-28 ENCOUNTER — Encounter: Payer: Self-pay | Admitting: Hematology and Oncology

## 2020-11-28 ENCOUNTER — Telehealth: Payer: Self-pay | Admitting: Hematology and Oncology

## 2020-11-28 NOTE — Telephone Encounter (Signed)
Rescheduled per provider. Called pt and left a msg

## 2020-11-30 ENCOUNTER — Telehealth: Payer: Self-pay

## 2020-11-30 NOTE — Telephone Encounter (Signed)
Returned wife's call. She has called scheduling several times and has not received a call back. Needs to move 9/27 appt. Appts moved to 9/23. She is aware of appts date/time.

## 2020-12-04 ENCOUNTER — Encounter (INDEPENDENT_AMBULATORY_CARE_PROVIDER_SITE_OTHER): Payer: Medicare Other | Admitting: Ophthalmology

## 2020-12-04 ENCOUNTER — Other Ambulatory Visit: Payer: Self-pay

## 2020-12-04 DIAGNOSIS — H33303 Unspecified retinal break, bilateral: Secondary | ICD-10-CM

## 2020-12-04 DIAGNOSIS — H26491 Other secondary cataract, right eye: Secondary | ICD-10-CM | POA: Diagnosis not present

## 2020-12-04 DIAGNOSIS — H43813 Vitreous degeneration, bilateral: Secondary | ICD-10-CM | POA: Diagnosis not present

## 2020-12-05 DIAGNOSIS — J383 Other diseases of vocal cords: Secondary | ICD-10-CM | POA: Diagnosis not present

## 2020-12-05 DIAGNOSIS — J3801 Paralysis of vocal cords and larynx, unilateral: Secondary | ICD-10-CM | POA: Diagnosis not present

## 2020-12-05 DIAGNOSIS — R490 Dysphonia: Secondary | ICD-10-CM | POA: Diagnosis not present

## 2020-12-08 ENCOUNTER — Other Ambulatory Visit (HOSPITAL_BASED_OUTPATIENT_CLINIC_OR_DEPARTMENT_OTHER): Payer: Self-pay

## 2020-12-08 MED ORDER — ALFUZOSIN HCL ER 10 MG PO TB24
10.0000 mg | ORAL_TABLET | Freq: Every day | ORAL | 0 refills | Status: DC
  Filled 2020-12-08: qty 90, 90d supply, fill #0

## 2020-12-11 ENCOUNTER — Other Ambulatory Visit (HOSPITAL_BASED_OUTPATIENT_CLINIC_OR_DEPARTMENT_OTHER): Payer: Self-pay

## 2020-12-12 ENCOUNTER — Other Ambulatory Visit: Payer: Medicare Other

## 2020-12-12 ENCOUNTER — Ambulatory Visit: Payer: Medicare Other | Admitting: Hematology and Oncology

## 2020-12-13 ENCOUNTER — Ambulatory Visit (INDEPENDENT_AMBULATORY_CARE_PROVIDER_SITE_OTHER): Payer: Medicare Other | Admitting: Neurology

## 2020-12-13 DIAGNOSIS — I714 Abdominal aortic aneurysm, without rupture, unspecified: Secondary | ICD-10-CM

## 2020-12-13 DIAGNOSIS — G6289 Other specified polyneuropathies: Secondary | ICD-10-CM

## 2020-12-13 DIAGNOSIS — C8318 Mantle cell lymphoma, lymph nodes of multiple sites: Secondary | ICD-10-CM

## 2020-12-13 DIAGNOSIS — G4733 Obstructive sleep apnea (adult) (pediatric): Secondary | ICD-10-CM

## 2020-12-13 DIAGNOSIS — G2581 Restless legs syndrome: Secondary | ICD-10-CM

## 2020-12-13 DIAGNOSIS — I480 Paroxysmal atrial fibrillation: Secondary | ICD-10-CM

## 2020-12-13 DIAGNOSIS — Z77098 Contact with and (suspected) exposure to other hazardous, chiefly nonmedicinal, chemicals: Secondary | ICD-10-CM

## 2020-12-14 DIAGNOSIS — J383 Other diseases of vocal cords: Secondary | ICD-10-CM | POA: Diagnosis not present

## 2020-12-14 DIAGNOSIS — J3801 Paralysis of vocal cords and larynx, unilateral: Secondary | ICD-10-CM | POA: Diagnosis not present

## 2020-12-14 DIAGNOSIS — R490 Dysphonia: Secondary | ICD-10-CM | POA: Diagnosis not present

## 2020-12-15 ENCOUNTER — Other Ambulatory Visit: Payer: Self-pay

## 2020-12-15 ENCOUNTER — Encounter: Payer: Self-pay | Admitting: Hematology and Oncology

## 2020-12-15 ENCOUNTER — Inpatient Hospital Stay: Payer: Medicare Other | Attending: Hematology and Oncology

## 2020-12-15 ENCOUNTER — Inpatient Hospital Stay (HOSPITAL_BASED_OUTPATIENT_CLINIC_OR_DEPARTMENT_OTHER): Payer: Medicare Other | Admitting: Hematology and Oncology

## 2020-12-15 DIAGNOSIS — Z79899 Other long term (current) drug therapy: Secondary | ICD-10-CM | POA: Diagnosis not present

## 2020-12-15 DIAGNOSIS — Z452 Encounter for adjustment and management of vascular access device: Secondary | ICD-10-CM | POA: Diagnosis not present

## 2020-12-15 DIAGNOSIS — I480 Paroxysmal atrial fibrillation: Secondary | ICD-10-CM | POA: Diagnosis not present

## 2020-12-15 DIAGNOSIS — C8318 Mantle cell lymphoma, lymph nodes of multiple sites: Secondary | ICD-10-CM

## 2020-12-15 DIAGNOSIS — M542 Cervicalgia: Secondary | ICD-10-CM | POA: Diagnosis not present

## 2020-12-15 DIAGNOSIS — Z85850 Personal history of malignant neoplasm of thyroid: Secondary | ICD-10-CM | POA: Insufficient documentation

## 2020-12-15 DIAGNOSIS — C73 Malignant neoplasm of thyroid gland: Secondary | ICD-10-CM

## 2020-12-15 DIAGNOSIS — N400 Enlarged prostate without lower urinary tract symptoms: Secondary | ICD-10-CM | POA: Diagnosis not present

## 2020-12-15 LAB — CMP (CANCER CENTER ONLY)
ALT: 18 U/L (ref 0–44)
AST: 18 U/L (ref 15–41)
Albumin: 4 g/dL (ref 3.5–5.0)
Alkaline Phosphatase: 66 U/L (ref 38–126)
Anion gap: 7 (ref 5–15)
BUN: 19 mg/dL (ref 8–23)
CO2: 27 mmol/L (ref 22–32)
Calcium: 9.2 mg/dL (ref 8.9–10.3)
Chloride: 106 mmol/L (ref 98–111)
Creatinine: 1.17 mg/dL (ref 0.61–1.24)
GFR, Estimated: >60 mL/min (ref >60)
Glucose, Bld: 112 mg/dL — ABNORMAL HIGH (ref 70–99)
Potassium: 4.3 mmol/L (ref 3.5–5.1)
Sodium: 140 mmol/L (ref 135–145)
Total Bilirubin: 1.3 mg/dL — ABNORMAL HIGH (ref 0.3–1.2)
Total Protein: 6.3 g/dL — ABNORMAL LOW (ref 6.5–8.1)

## 2020-12-15 LAB — CBC WITH DIFFERENTIAL (CANCER CENTER ONLY)
Abs Immature Granulocytes: 0.01 10*3/uL (ref 0.00–0.07)
Basophils Absolute: 0 10*3/uL (ref 0.0–0.1)
Basophils Relative: 1 %
Eosinophils Absolute: 0.2 10*3/uL (ref 0.0–0.5)
Eosinophils Relative: 3 %
HCT: 38.8 % — ABNORMAL LOW (ref 39.0–52.0)
Hemoglobin: 12.8 g/dL — ABNORMAL LOW (ref 13.0–17.0)
Immature Granulocytes: 0 %
Lymphocytes Relative: 17 %
Lymphs Abs: 0.9 10*3/uL (ref 0.7–4.0)
MCH: 30.3 pg (ref 26.0–34.0)
MCHC: 33 g/dL (ref 30.0–36.0)
MCV: 91.7 fL (ref 80.0–100.0)
Monocytes Absolute: 0.5 10*3/uL (ref 0.1–1.0)
Monocytes Relative: 9 %
Neutro Abs: 3.9 10*3/uL (ref 1.7–7.7)
Neutrophils Relative %: 70 %
Platelet Count: 151 10*3/uL (ref 150–400)
RBC: 4.23 MIL/uL (ref 4.22–5.81)
RDW: 13.3 % (ref 11.5–15.5)
WBC Count: 5.5 10*3/uL (ref 4.0–10.5)
nRBC: 0 % (ref 0.0–0.2)

## 2020-12-15 MED ORDER — HEPARIN SOD (PORK) LOCK FLUSH 100 UNIT/ML IV SOLN
500.0000 [IU] | Freq: Once | INTRAVENOUS | Status: AC
  Administered 2020-12-15: 500 [IU]

## 2020-12-15 MED ORDER — SODIUM CHLORIDE 0.9% FLUSH
10.0000 mL | Freq: Once | INTRAVENOUS | Status: AC
  Administered 2020-12-15: 10 mL

## 2020-12-15 NOTE — Assessment & Plan Note (Signed)
His last CT imaging show no evidence of disease Clinically, he has no signs or symptoms to suggest cancer recurrence We discussed the risk and benefit of imaging study Apparently, [facility] is planning for CT imaging and I will check on the imaging once it is done I plan to see him in 6 months with history and physical examination I recommend port removal

## 2020-12-15 NOTE — Assessment & Plan Note (Signed)
His last imaging study showed no evidence of disease I will defer to his physician at [facility] for follow-up

## 2020-12-15 NOTE — Assessment & Plan Note (Signed)
The patient is noted to have some limited neck range of motion We discussed the importance of frequent neck exercises

## 2020-12-15 NOTE — Progress Notes (Signed)
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 show no evidence of disease Clinically, he has no signs or symptoms to suggest cancer recurrence We discussed the risk and benefit of imaging study Apparently, [facility] is planning for CT imaging and I will check on the imaging once it is done I plan to see him in 6 months with history and physical examination I recommend port removal  Thyroid cancer ([place]) His last imaging study showed no evidence of disease I will defer to his physician at [facility] for follow-up  Neck discomfort The patient is noted to have some limited neck range of motion We discussed the importance of frequent neck exercises  No orders of the defined types were placed in this encounter.   All questions were answered. The patient knows to call the clinic with any problems, questions or concerns. The total time spent in the appointment was 20 minutes encounter with patients including review of chart and various tests results, discussions about plan of care and coordination of care plan   Heath Lark, MD 12/15/2020 12:13 PM  INTERVAL HISTORY: Please see below for problem oriented charting. he returns for follow-up for history of mantle cell lymphoma and thyroid cancer He is here accompanied by his wife He was recently found to have paroxysmal atrial fibrillation and is currently on medical management No new lymphadenopathy He had COVID infection several months ago but resolved after treatment He has no lingering side effects He continues to battle with peripheral neuropathy and hoarseness of his voice He follows at [facility] for treatment for his hoarseness He follows at [facility]  for history of aneurysm  REVIEW OF SYSTEMS:   Constitutional: Denies fevers, chills or abnormal weight loss Eyes: Denies blurriness of vision Ears, nose, mouth, throat, and face: Denies mucositis or sore throat Respiratory: Denies cough, dyspnea or wheezes Cardiovascular: Denies palpitation, chest discomfort or lower extremity swelling Gastrointestinal:  Denies nausea, heartburn or change in bowel habits Skin: Denies abnormal skin rashes Lymphatics: Denies new lymphadenopathy or easy bruising Behavioral/Psych: Mood is stable, no new changes  All other systems were reviewed with the patient and are negative.  I have reviewed the past medical history, past surgical history, social history and family history with the patient and they are unchanged from previous note.  ALLERGIES:  is allergic to aspirin, levaquin [levofloxacin in d5w], nickel, other, simvastatin, statins, allopurinol, ampicillin, and penicillin g.  MEDICATIONS:  Current Outpatient Medications  Medication Sig Dispense Refill   acetaminophen (TYLENOL) 500 MG tablet Take 1,000 mg by mouth daily as needed for moderate pain.     alfuzosin (UROXATRAL) 10 MG 24 hr tablet Take 10 mg by mouth at bedtime.      alfuzosin (UROXATRAL) 10 MG 24 hr tablet TAKE 1 TABLET DAILY 120 tablet 0   buPROPion (WELLBUTRIN XL) 150 MG 24 hr tablet Take 1 tablet (150 mg total) by mouth daily. 90 tablet 3   Cholecalciferol (VITAMIN D) 2000 units tablet Take 2,000 Units by mouth at bedtime.      ezetimibe (ZETIA) 10 MG tablet Take 10 mg by mouth every morning.     finasteride (PROSCAR) 5 MG tablet Take 5 mg by mouth at bedtime.      levothyroxine (  SYNTHROID) 175 MCG tablet Take 1 tablet (175 mcg total) by mouth daily.     lidocaine-prilocaine (EMLA) cream Apply to affected area once before sleep time- 30 g 3   metoprolol succinate (TOPROL-XL) 25 MG 24 hr tablet Take 12.5 mg by mouth every evening.     Polyethyl Glycol-Propyl Glycol (SYSTANE HYDRATION PF  OP) Place 1 drop into both eyes 3 (three) times daily as needed (dry eyes).      No current facility-administered medications for this visit.    SUMMARY OF ONCOLOGIC HISTORY: Oncology History Overview Note  MIPI score 7.5 high risk   Mantle cell lymphoma ([place])  06/13/2017 Imaging   Ct imaging at [facility] 1. 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 2. 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 3. Slight reduction in size of right retroperitoneal evolving hematoma.   03/30/2018 Imaging   Ct neck 1. Extensive adenopathy on the left. The largest node is a level 1/submandibular node measuring 38 x 24 x 22 mm. Largest level 2 node measures 3.2 x 2 x 2 cm. Numerous other smaller but round lymph nodes throughout the left neck in the level 2 through level 4 region. The largest level 5 node measures 3.4 x 3.4 x 2.3 cm with a transverse diameter of 2.3 cm. This contains some internal calcifications. There are numerous other pathologic nodes in the left supraclavicular to axillary region. This pattern of disease could be due to extensive left neck metastatic disease from unknown primary, lymphoma, or other systemic malignancy. 2. No evidence of mucosal or submucosal lesion. 3. Diameter of the ascending aorta is 4.5 cm. Recommend annual imaging followup by CTA or MRA. Aortic Atherosclerosis (ICD10-I70.0).     04/10/2018 Pathology Results   Left zone 1 neck mass, Fine Needle Aspiration I (smears and cell block):      Atypical lymphoid proliferation. See comment.       Specimen Adequacy:  Satisfactory for evaluation.  COMMENT:The aspirate demonstrates abundant small to medium sized lymphocytes with scattered epithelioid cells in the background which may be histiocytes.  The smears are cellular, but the cell block includes scant  lymphocytes.  Immunohistochemical stains were attempted showing positive staining for CD20 with rare staining for CD3.  Cytokeratin AE1/AE3 is negative. S100 shows high nonspecific background staining but no specifically diagnostic cellular staining.  Overall, the findings indicate an atypical lymphoid proliferation but are too limited for definitive diagnosis.  Excisional biopsy with flow cytometry is recommended.   04/10/2018 Procedure   He was ENT who performed FNA of neck LN   04/27/2018 Pathology Results   Lymph node for lymphoma, [name], [name], [name], [name], [name], [name], [name], the features are consistent with a mantle cell lymphoma   05/13/2018 PET scan   1. Hypermetabolic left cervical lymphadenopathy, left axillary lymphadenopathy, mediastinal / right hilar lymphadenopathy, right external  iliac lymphadenopathy, and subcentimeter left external iliac and left inguinal lymph nodes, [name], [name], [name], without focal lesions, that may represent lymphomatous involvement of the spleen.   05/21/2018 Cancer Staging   Staging form: Hodgkin and Non-Hodgkin Lymphoma, AJCC 8th Edition - Clinical: Stage III - Signed by Heath Lark, MD on 05/21/2018    05/26/2018 Procedure   Ultrasound and fluoroscopically guided right internal jugular single lumen power port catheter insertion. Tip in the SVC/RA junction. Catheter ready for use.     06/01/2018 -  Chemotherapy   The patient had Bendamustine and Rituximab for chemotherapy treatment   08/24/2018 PET scan   1. Partial response to therapy with complete resolution of size and metabolic activity of the dominant LEFT submandibular node (level 1) and LEFT axillary nodes. 2. Persistent and increased metabolic activity of left level 3 lymph nodes ( [place] 4). Nodes are partially calcified and similar size. 3. No new metastatic disease. 4. Normal spleen and bone marrow.   11/09/2018 PET scan   IMPRESSION: 1. Mild response to therapy of hypermetabolic left cervical nodes. ([place]) 4. 2. No new or progressive disease. 3. Coronary artery atherosclerosis. Aortic Atherosclerosis (ICD10-I70.0). 4. Trace cul-de-sac fluid, similar.   12/18/2018 Imaging   1. 4 mm solid nodule in the right lower lobe, which is technically indeterminate. Recommend attention on follow-up per clinical protocol. 2. Small calcified nodule adjacent to the left lobe of the thyroid, which may represent thyroid nodule or calcified lymph node. Recommend correlation with thyroid ultrasound. 3. The ascending aorta appears mildly dilated, measuring up to 4.2cm. 4. Decreased left axillary lymphadenopathy.   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   Stranding with postsurgical changes along the right anterior neck anterior to the thyroid cartilage, favored to be related to recent surgery. Attention on follow-up is suggested.   No suspicious lymphadenopathy in the neck, chest, abdomen, or pelvis. Deauville criteria 1   11/22/2019 Imaging   1. No evidence of recurrent lymphoma in the chest, abdomen or pelvis. Normal size spleen. 2. Chronic  findings include: Ectatic 4.4 cm ascending thoracic aorta. Recommend annual imaging followup by CTA or MRA. This recommendation follows 2010 CCF/AHA/AATS/ACR/ASA/SCA/SCAI/SIR/STS/SVM Guidelines for the Diagnosis and Management of Patients with Thoracic Aortic Disease. Circulation. 2010; 121: J092-H574. Aortic aneurysm NOS (ICD10-I71.9). Moderate diffuse colonic diverticulosis. Mild prostatomegaly. Aortic Atherosclerosis (ICD10-I70.0).   05/30/2020 Imaging   Stable exam. No evidence of recurrent lymphoma or metastatic disease within the chest, abdomen, or pelvis.   Colonic diverticulosis, without radiographic evidence of diverticulitis.   Tiny nonobstructing left renal calculus.   Stable mildly enlarged prostate.   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   Aortic and coronary atherosclerotic calcification.   Thyroid cancer ([place])  11/23/2018 Pathology Results   Lymph node for lymphoma, Left zone 3 - PAPILLARY THYROID CARCINOMA. - SEE MICROSCOPIC DESCRIPTION. Microscopic Comment The specimen consists mostly of encapsulated papillary thyroid carcinoma. There are portions of lymphoid tissue attached to the periphery of the specimen and in the adjacent adipose tissue which suggests that this may represent papillary carcinoma metastatic to a lymph node.   11/23/2018 Surgery   PREOPERATIVE DIAGNOSIS:  Mantle cell lymphoma and cervical lymphadenopathy.   POSTOPERATIVE DIAGNOSIS:  Mantle cell lymphoma and cervical lymphadenopathy.   PROCEDURE:  Excisional biopsy of left cervical  lymph nodes.     01/15/2019 Pathology Results   A. SOFT TISSUE, NECK, ADJACENT TO LEFT RECURRENT NERVE, BIOPSY: - Papillary thyroid carcinoma. B. 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 C. ADDITIONAL LEFT LEVEL 4 TISSUE, EXCISION: - There is no evidence of carcinoma in 2 of 2 lymph nodes (0/2). D. TOTAL THYROIDECTOMY: - Papillary  thyroid carcinoma, 0.8 cm. - Carcinoma is broadly present at an inked tissue edge. - See oncology table below. E. LEFT LEVEL 6 TISSUE, EXCISION: - Benign fibroadipose tissue. - There is no evidence of malignancy. F. LEFT RECURRENT NERVE AND SURROUNDING TISSUE, EXCISION: - Metastatic papillary thyroid carcinoma in 3 of 3 lymph nodes (3/3).  THYROID GLAND: Procedure: Thyroidectomy Tumor Focality: Unifocal Tumor Site: Left lobe Tumor Size: 0.8 cm Histologic Type: 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Margins: Carcinoma is broadly present at a black inked tissue edge. 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   PROCEDURE:  Total thyroidectomy and left modified radical neck dissection.   SURGEON:  Melida Quitter, MD     FINDINGS:  There was a firm nodule in zone IIB on the left side and also some firm tissue and adherence to the jugular vein in zone III region requiring removal of a portion of the vein.  There was a firm nodule in the left thyroid lobe that extended posterior to the gland, adhering to the trachea, and encompassing the recurrent laryngeal nerve.  Tissue was sent for frozen section from adjacent to the nerve, demonstrating papillary carcinoma.  The nerve stimulated distal to the mass but not proximal to the mass; thus, the nerve was removed with the mass.  The left-sided parathyroid glands were  visualized, as was the right superior parathyroid gland.  The right recurrent nerve was kept safe and stimulated well at the end of the case.   05/30/2020 Imaging   Negative for recurrent mass or adenopathy in the neck   Left vocal cord paresis.     PHYSICAL EXAMINATION: ECOG PERFORMANCE STATUS: 1 - Symptomatic but completely ambulatory  Vitals:   12/15/20 1124  BP: 127/78  Pulse: 69  Resp: 18  Temp: 97.7 F (36.5 C)  SpO2: (!) 78%   Filed Weights   12/15/20 1124  Weight: 217 lb 6.4 oz (98.6 kg)    GENERAL:alert, no distress and comfortable SKIN: skin color, texture, turgor are normal, no rashes or significant lesions EYES: normal, Conjunctiva are pink and non-injected, sclera clear OROPHARYNX:no exudate, no erythema and lips, buccal mucosa, and tongue normal  NECK: Noted well-healed surgical scar.  Noted some limitation of range of motion around his neck LYMPH:  no palpable lymphadenopathy in the cervical, axillary or inguinal LUNGS: clear to auscultation and percussion with normal breathing effort HEART: regular rate & rhythm and no murmurs and no lower extremity edema ABDOMEN:abdomen soft, non-tender and normal bowel sounds Musculoskeletal:no cyanosis of digits and no clubbing  NEURO: alert & oriented x 3 with hoarseness ,  no focal motor/sensory deficits  LABORATORY DATA:  I have reviewed the data as listed    Component Value Date/Time   NA 140 12/15/2020 1048   K 4.3 12/15/2020 1048   CL 106 12/15/2020 1048   CO2 27 12/15/2020 1048   GLUCOSE 112 (H) 12/15/2020 1048   BUN 19 12/15/2020 1048   CREATININE 1.17 12/15/2020 1048   CALCIUM 9.2 12/15/2020 1048   PROT 6.3 (L) 12/15/2020 1048   PROT 6.6 07/23/2017 1504   ALBUMIN 4.0 12/15/2020 1048   AST 18 12/15/2020 1048   ALT 18 12/15/2020 1048   ALKPHOS 66 12/15/2020 1048   BILITOT 1.3 (H) 12/15/2020 1048   GFRNONAA >60 12/15/2020 1048   GFRAA >60 11/22/2019 1023    No results found for: SPEP, UPEP  Lab  Results  Component Value Date   WBC 5.5 12/15/2020   NEUTROABS 3.9 12/15/2020   HGB 12.8 (L) 12/15/2020   HCT 38.8 (L) 12/15/2020   MCV 91.7 12/15/2020   PLT 151 12/15/2020      Chemistry      Component Value Date/Time   NA 140 12/15/2020 1048   K 4.3 12/15/2020 1048   CL 106 12/15/2020 1048   CO2 27 12/15/2020 1048   BUN 19 12/15/2020 1048   CREATININE 1.17 12/15/2020 1048      Component Value Date/Time   CALCIUM 9.2 12/15/2020 1048   ALKPHOS 66 12/15/2020 1048   AST 18 12/15/2020 1048   ALT 18 12/15/2020 1048   BILITOT 1.3 (H) 12/15/2020 1048

## 2020-12-18 NOTE — Progress Notes (Signed)
        [facility] [place] TEST REPORT ( by Watch PAT)   STUDY DATE: 12-13-2020   DOB:1940-12-01      ORDERING CLINICIAN: Larey Seat, MD  REFERRING CLINICIAN: Dr Inda Merlin   CLINICAL INFORMATION/HISTORY: 80 year-old patient with PAF, mantle cell carcinoma, Vocal cord paralysis, RLS and agent orange related neuropathy. Had used CPAP. 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    Epworth sleepiness score: 11/24.   BMI: 28.6 kg/m   Neck Circumference: 18"   FINDINGS:   Sleep Summary: This home sleep study has a total recording time of 9 hours and 4 minutes of which 6 hours and 56 minutes were calculated as total sleep time, the percentage of REM sleep during sleep time was 14.3%.                                      Respiratory Indices: Total AHI per hour of sleep was 10.8/h and REM sleep 17.5/h and the AHI in non-REM sleep was 9.6/h.  This is mild sleep apnea.                       Supine AHI: 27.2/h. Non supine AHI on the right side was 2.4/h. Snoring was mild with a mean volume of 40 dB and present for only 8.1% of total sleep time.                                                  Oxygen Saturation Statistics:   O2 Saturation Range (%): The oxygen saturation range between a nadir of 88% of maximum saturation of 98% and a mean oxygen saturation of 95%.  There was no significant hypoxia time noted                                     O2 Saturation (minutes) <89%:  0.1 minute.         Pulse Rate Statistics:    Pulse Range:    Heart rate ranged between a maximum of 84 and a calculated minimum heart rate of 46 bpm with a mean heart rate of 58 bpm.             IMPRESSION:  This HST confirms the presence of very mild sleep apnea, mild snoring and a clinically insignificant degree of hypoxia.  Since this mild apnea was also very positional dependent I would recommend for the patient to sleep on his right side where the AHI was 2.4/h and avoid supine sleep position.  I would not recommend  CPAP intervention.   RECOMMENDATION: Since this mild apnea was also very positional dependent I would recommend for the patient to sleep on his right side where the AHI was 2.4/h and avoid supine sleep position.  I would not recommend CPAP intervention.   INTERPRETING PHYSICIAN:   Larey Seat, MD   Medical Director of [facility] at [facility].

## 2020-12-19 ENCOUNTER — Other Ambulatory Visit: Payer: Medicare Other

## 2020-12-19 ENCOUNTER — Ambulatory Visit: Payer: Medicare Other | Admitting: Hematology and Oncology

## 2020-12-20 ENCOUNTER — Ambulatory Visit: Payer: Medicare Other | Admitting: Hematology

## 2020-12-20 ENCOUNTER — Other Ambulatory Visit: Payer: Medicare Other

## 2020-12-25 ENCOUNTER — Institutional Professional Consult (permissible substitution): Payer: Medicare Other | Admitting: Internal Medicine

## 2020-12-25 DIAGNOSIS — R633 Feeding difficulties, unspecified: Secondary | ICD-10-CM | POA: Diagnosis not present

## 2020-12-25 DIAGNOSIS — J3801 Paralysis of vocal cords and larynx, unilateral: Secondary | ICD-10-CM | POA: Diagnosis not present

## 2020-12-25 DIAGNOSIS — R1312 Dysphagia, oropharyngeal phase: Secondary | ICD-10-CM | POA: Diagnosis not present

## 2020-12-25 DIAGNOSIS — R1310 Dysphagia, unspecified: Secondary | ICD-10-CM | POA: Diagnosis not present

## 2020-12-25 DIAGNOSIS — J383 Other diseases of vocal cords: Secondary | ICD-10-CM | POA: Diagnosis not present

## 2020-12-25 DIAGNOSIS — R490 Dysphonia: Secondary | ICD-10-CM | POA: Diagnosis not present

## 2020-12-27 DIAGNOSIS — I480 Paroxysmal atrial fibrillation: Secondary | ICD-10-CM | POA: Diagnosis not present

## 2020-12-27 DIAGNOSIS — I728 Aneurysm of other specified arteries: Secondary | ICD-10-CM | POA: Diagnosis not present

## 2020-12-27 DIAGNOSIS — I7121 Aneurysm of the ascending aorta, without rupture: Secondary | ICD-10-CM | POA: Diagnosis not present

## 2020-12-27 DIAGNOSIS — Z23 Encounter for immunization: Secondary | ICD-10-CM | POA: Diagnosis not present

## 2020-12-27 DIAGNOSIS — C8310 Mantle cell lymphoma, unspecified site: Secondary | ICD-10-CM | POA: Diagnosis not present

## 2020-12-27 DIAGNOSIS — I4891 Unspecified atrial fibrillation: Secondary | ICD-10-CM | POA: Diagnosis not present

## 2020-12-27 DIAGNOSIS — I722 Aneurysm of renal artery: Secondary | ICD-10-CM | POA: Diagnosis not present

## 2021-01-01 DIAGNOSIS — I728 Aneurysm of other specified arteries: Secondary | ICD-10-CM | POA: Diagnosis not present

## 2021-01-01 DIAGNOSIS — I7121 Aneurysm of the ascending aorta, without rupture: Secondary | ICD-10-CM | POA: Diagnosis not present

## 2021-01-01 DIAGNOSIS — I722 Aneurysm of renal artery: Secondary | ICD-10-CM | POA: Diagnosis not present

## 2021-01-02 ENCOUNTER — Telehealth: Payer: Self-pay | Admitting: Neurology

## 2021-01-02 ENCOUNTER — Ambulatory Visit
Admission: RE | Admit: 2021-01-02 | Discharge: 2021-01-02 | Disposition: A | Discharge status: Home / Self Care | Payer: Self-pay | Source: Ambulatory Visit | Attending: Hematology and Oncology | Admitting: Hematology and Oncology

## 2021-01-02 ENCOUNTER — Other Ambulatory Visit: Payer: Self-pay

## 2021-01-02 DIAGNOSIS — C8318 Mantle cell lymphoma, lymph nodes of multiple sites: Secondary | ICD-10-CM

## 2021-01-02 DIAGNOSIS — I480 Paroxysmal atrial fibrillation: Secondary | ICD-10-CM | POA: Insufficient documentation

## 2021-01-02 HISTORY — DX: Paroxysmal atrial fibrillation: I48.0

## 2021-01-02 NOTE — Telephone Encounter (Signed)
-----   Message from Larey Seat, MD sent at 01/02/2021  2:07 PM EDT ----- IMPRESSION:  This HST confirms the presence of very mild sleep apnea, mild snoring and a clinically insignificant degree of hypoxia.  Since this mild apnea was also very positional dependent I would recommend for the patient to sleep on his right side where the AHI was 2.4/h and avoid supine sleep position.  I would not recommend CPAP intervention.  RECOMMENDATION: Since this mild apnea was also very positional dependent I would recommend for the patient to sleep on his right side where the AHI was 2.4/h and avoid supine sleep position.  I would not recommend CPAP intervention.

## 2021-01-02 NOTE — Telephone Encounter (Signed)
Called the patient and advised of the sleep study results. Reviewed in detail with the patient and advised of the information. Pt verbalized understanding. Pt had no questions at this time but was encouraged to call back if questions arise.

## 2021-01-02 NOTE — Progress Notes (Signed)
IMPRESSION:  This HST confirms the presence of very mild sleep apnea, mild snoring and a clinically insignificant degree of hypoxia.  Since this mild apnea was also very positional dependent I would recommend for the patient to sleep on his right side where the AHI was 2.4/h and avoid supine sleep position.  I would not recommend CPAP intervention.  RECOMMENDATION: Since this mild apnea was also very positional dependent I would recommend for the patient to sleep on his right side where the AHI was 2.4/h and avoid supine sleep position.  I would not recommend CPAP intervention.

## 2021-01-02 NOTE — Procedures (Signed)
[facility] [place] TEST REPORT ( by Watch PAT)   STUDY DATE: 12-13-2020   DOB:09-10-1940      ORDERING CLINICIAN: Larey Seat, MD  REFERRING CLINICIAN: Dr Inda Merlin   CLINICAL INFORMATION/HISTORY: 80 year-old patient with PAF, mantle cell carcinoma, Vocal cord paralysis, RLS and agent orange related neuropathy. Had used CPAP. 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    Epworth sleepiness score: 11/24.   BMI: 28.6 kg/m   Neck Circumference: 18"   FINDINGS:   Sleep Summary: This home sleep study has a total recording time of 9 hours and 4 minutes of which 6 hours and 56 minutes were calculated as total sleep time, the percentage of REM sleep during sleep time was 14.3%.                                      Respiratory Indices: Total AHI per hour of sleep was 10.8/h and REM sleep 17.5/h and the AHI in non-REM sleep was 9.6/h.  This is mild sleep apnea.                       Supine AHI: 27.2/h. Non supine AHI on the right side was 2.4/h. Snoring was mild with a mean volume of 40 dB and present for only 8.1% of total sleep time.                                                  Oxygen Saturation Statistics:   O2 Saturation Range (%): The oxygen saturation range between a nadir of 88% of maximum saturation of 98% and a mean oxygen saturation of 95%.  There was no significant hypoxia time noted                                     O2 Saturation (minutes) <89%:  0.1 minute.         Pulse Rate Statistics:    Pulse Range:    Heart rate ranged between a maximum of 84 and a calculated minimum heart rate of 46 bpm with a mean heart rate of 58 bpm.             IMPRESSION:  This HST confirms the presence of very mild sleep apnea, mild snoring and a clinically insignificant degree of hypoxia.  Since this mild apnea was also very positional dependent I would recommend for the patient to sleep on his right side where the AHI was 2.4/h and avoid supine sleep position.  I would not recommend CPAP  intervention.   RECOMMENDATION: Since this mild apnea was also very positional dependent I would recommend for the patient to sleep on his right side where the AHI was 2.4/h and avoid supine sleep position.  I would not recommend CPAP intervention.   INTERPRETING PHYSICIAN:   Larey Seat, MD   Medical Director of [facility] at [facility].

## 2021-01-04 ENCOUNTER — Encounter: Payer: Self-pay | Admitting: Hematology and Oncology

## 2021-01-05 ENCOUNTER — Other Ambulatory Visit: Payer: Self-pay | Admitting: Hematology and Oncology

## 2021-01-05 DIAGNOSIS — C8318 Mantle cell lymphoma, lymph nodes of multiple sites: Secondary | ICD-10-CM

## 2021-01-16 DIAGNOSIS — I480 Paroxysmal atrial fibrillation: Secondary | ICD-10-CM | POA: Diagnosis not present

## 2021-02-04 ENCOUNTER — Other Ambulatory Visit: Payer: Self-pay | Admitting: Physician Assistant

## 2021-02-05 ENCOUNTER — Encounter (HOSPITAL_COMMUNITY): Payer: Self-pay

## 2021-02-05 ENCOUNTER — Ambulatory Visit (HOSPITAL_COMMUNITY)
Admission: RE | Admit: 2021-02-05 | Discharge: 2021-02-05 | Disposition: A | Discharge status: Home / Self Care | Payer: Medicare Other | Source: Ambulatory Visit | Attending: Hematology and Oncology | Admitting: Hematology and Oncology

## 2021-02-05 ENCOUNTER — Other Ambulatory Visit: Payer: Self-pay

## 2021-02-05 DIAGNOSIS — C8318 Mantle cell lymphoma, lymph nodes of multiple sites: Secondary | ICD-10-CM | POA: Insufficient documentation

## 2021-02-05 DIAGNOSIS — Z452 Encounter for adjustment and management of vascular access device: Secondary | ICD-10-CM | POA: Diagnosis not present

## 2021-02-05 DIAGNOSIS — Z8572 Personal history of non-Hodgkin lymphomas: Secondary | ICD-10-CM | POA: Diagnosis not present

## 2021-02-05 HISTORY — PX: IR REMOVAL TUN ACCESS W/ PORT W/O FL MOD SED: IMG2290

## 2021-02-05 MED ORDER — FENTANYL CITRATE (PF) 100 MCG/2ML IJ SOLN
INTRAMUSCULAR | Status: AC | PRN
  Administered 2021-02-05 (×2): 50 ug via INTRAVENOUS

## 2021-02-05 MED ORDER — FENTANYL CITRATE (PF) 100 MCG/2ML IJ SOLN
INTRAMUSCULAR | Status: AC
  Filled 2021-02-05: qty 2

## 2021-02-05 MED ORDER — MIDAZOLAM HCL 2 MG/2ML IJ SOLN
INTRAMUSCULAR | Status: AC
  Filled 2021-02-05: qty 2

## 2021-02-05 MED ORDER — MIDAZOLAM HCL 2 MG/2ML IJ SOLN
INTRAMUSCULAR | Status: AC | PRN
  Administered 2021-02-05 (×2): 1 mg via INTRAVENOUS

## 2021-02-05 MED ORDER — LIDOCAINE HCL (PF) 1 % IJ SOLN
INTRAMUSCULAR | Status: AC
  Filled 2021-02-05: qty 30

## 2021-02-05 MED ORDER — SODIUM CHLORIDE 0.9 % IV SOLN: INTRAVENOUS | Status: DC

## 2021-02-05 NOTE — Discharge Instructions (Signed)
For questions /concerns may call Interventional Radiology at [phone]  You may remove your dressing and shower tomorrow afternoon    Implanted Port Removal, Care After This sheet gives you information about how to care for yourself after your procedure. Your health care provider may also give you more specific instructions. If you have problems or questions, contact your health careprovider. What can I expect after the procedure? After the procedure, it is common to have: Soreness or pain near your incision. Some swelling or bruising near your incision. Follow these instructions at home: Medicines Take over-the-counter and prescription medicines only as told by your health care provider. If you were prescribed an antibiotic medicine, take it as told by your health care provider. Do not stop taking the antibiotic even if you start to feel better. Bathing Do not take baths, swim, or use a hot tub until your health care provider approves. Ask your health care provider if you can take showers. You may only be allowed to take sponge baths. Incision care Follow instructions from your health care provider about how to take care of your incision. Make sure you: Wash your hands with soap and water before you change your bandage (dressing). If soap and water are not available, use hand sanitizer. Change your dressing as told by your health care provider. Keep your dressing dry. Leave  skin glue, or adhesive strips in place. These skin closures may need to stay in place for 2 weeks or longer. If adhesive strip edges start to loosen and curl up, you may trim the loose edges.  Check your incision area every day for signs of infection. Check for:       - More redness, swelling, or pain.       - More fluid or blood.       - Warmth.       - Pus or a bad smell.  Driving Do not drive for 24 hours if you were given a medicine to help you relax (sedative) during your procedure. If you did not receive  a sedative, ask your health care provider when it is safe to drive.  Activity Return to your normal activities as told by your health care provider. Ask your health care provider what activities are safe for you. Do not lift anything that is heavier than 10 lb (4.5 kg), or the limit that you are told, until your health care provider says that it is safe. Do not do activities that involve lifting your arms over your head. General instructions Do not use any products that contain nicotine or tobacco, such as cigarettes and e-cigarettes. These can delay healing. If you need help quitting, ask your health care provider. Keep all follow-up visits as told by your health care provider. This is important. Contact a health care provider if: You have more redness, swelling, or pain around your incision. You have more fluid or blood coming from your incision. Your incision feels warm to the touch. You have pus or a bad smell coming from your incision. You have pain that is not relieved by your pain medicine. Get help right away if you have: A fever or chills. Chest pain. Difficulty breathing. Summary After the procedure, it is common to have pain, soreness, swelling, or bruising near your incision. If you were prescribed an antibiotic medicine, take it as told by your health care provider. Do not stop taking the antibiotic even if you start to feel better. Do not drive for 24 hours  if you were given a sedative during your procedure. Return to your normal activities as told by your health care provider. Ask your health care provider what activities are safe for you. This information is not intended to replace advice given to you by your health care provider. Make sure you discuss any questions you have with your healthcare provider.   Moderate Conscious Sedation, Adult, Care After This sheet gives you information about how to care for yourself after your procedure. Your health care provider may also  give you more specific instructions. If you have problems or questions, contact your health careprovider. What can I expect after the procedure? After the procedure, it is common to have: Sleepiness for several hours. Impaired judgment for several hours. Difficulty with balance. Vomiting if you eat too soon. Follow these instructions at home: For the time period you were told by your health care provider: Rest. Do not participate in activities where you could fall or become injured. Do not drive or use machinery. Do not drink alcohol. Do not take sleeping pills or medicines that cause drowsiness. Do not make important decisions or sign legal documents. Do not take care of children on your own. Eating and drinking  Follow the diet recommended by your health care provider. Drink enough fluid to keep your urine pale yellow. If you vomit: Drink water, juice, or soup when you can drink without vomiting. Make sure you have little or no nausea before eating solid foods.  General instructions Take over-the-counter and prescription medicines only as told by your health care provider. Have a responsible adult stay with you for the time you are told. It is important to have someone help care for you until you are awake and alert. Do not smoke. Keep all follow-up visits as told by your health care provider. This is important. Contact a health care provider if: You are still sleepy or having trouble with balance after 24 hours. You feel light-headed. You keep feeling nauseous or you keep vomiting. You develop a rash. You have a fever. You have redness or swelling around the IV site. Get help right away if: You have trouble breathing. You have new-onset confusion at home. Summary After the procedure, it is common to feel sleepy, have impaired judgment, or feel nauseous if you eat too soon. Rest after you get home. Know the things you should not do after the procedure. Follow the diet  recommended by your health care provider and drink enough fluid to keep your urine pale yellow. Get help right away if you have trouble breathing or new-onset confusion at home. This information is not intended to replace advice given to you by your health care provider. Make sure you discuss any questions you have with your healthcare provider. Document Revised: 07/09/2019 Document Reviewed: 02/04/2019 Elsevier Patient Education  2022 [employer].

## 2021-02-05 NOTE — H&P (Signed)
Chief Complaint: Completion of chemotherapy. Request is for portacath removal. No longer needed  Referring Physician(s): Gorsuch,Ni  Supervising Physician: Aletta Edouard  Patient Status: [facility] - Out-pt  History of Present Illness: Sean Stanley is a 80 y.o. 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 IR placed a RIJ single lumen portacath on 3.3.20. Patient has since completed chemotherapy. Team is requesting portacath removal no longer needed.  Currently without any significant complaints. Patient alert and laying in bed, calm and comfortable. States that he has baseline pain in his bilateral lower extremities due to peripheral neuropathy and right knee pain. Denies any fevers, headache, chest pain, SOB, cough, abdominal pain, nausea, vomiting or bleeding. Return precautions and treatment recommendations and follow-up discussed with the patient who is agreeable with the plan.    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   s/p coiling 12/16/12 - [facility]   Atrial flutter ([facility])    BPH (benign prostatic hyperplasia)    Cancer ([place])    melanoma on lower right back and left chest - surgically removed and cleared   Dysrhythmia    H/O agent Orange exposure    Headache    migraine- not current   High 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 Maxwell Caul told him he had improved   Pneumonia    Thoracic ascending aortic aneurysm    4.5 cm 03/2018 CT   Thyroid cancer [facility])     Past Surgical History:  Procedure Laterality Date   ABDOMINAL ANGIOGRAM  08/05/12   aneurysm repair     CARDIOVERSION  02/12/2012   Procedure: CARDIOVERSION;  Surgeon: Pixie Casino, MD;  Location: [place];  Service: Cardiovascular;  Laterality: N/A;   CARDIOVERSION N/A 06/22/2013   Procedure: CARDIOVERSION;  Surgeon: [name], MD;  Location: [place];  Service: Cardiovascular;  Laterality: N/A;   CATARACT EXTRACTION Bilateral 2018   with lens implant   CHOLECYSTECTOMY     COLONOSCOPY     ESOPHAGOGASTRODUODENOSCOPY     IR IMAGING GUIDED PORT INSERTION  05/26/2018   LYMPH NODE BIOPSY Left 04/27/2018   Procedure: EXCISIONAL BIOPSY OF LEFT CERVICAL LYMPH NODE;  Surgeon: Melida Quitter, MD;  Location: [place];  Service: ENT;  Laterality: Left;   LYMPH NODE BIOPSY Left 11/23/2018   Procedure: LEFT CERVICAL LYMPH NODE OPEN BIOPSY;  Surgeon: Melida Quitter, MD;  Location: [place];  Service: ENT;  Laterality: Left;   MENISCUS REPAIR Right 2009   MITRAL VALVE REPAIR  2002   [facility]   NM MYOVIEW LTD  07/22/2006   no ischemia   RADICAL NECK DISSECTION Left 01/15/2019   Procedure: LEFT NECK DISSECTION;  Surgeon: Melida Quitter, MD;  Location: [facility] OR;  Service: ENT;  Laterality: Left;   RIGHT HEART CATH  06/19/2004   normal right heart dynamics. EF 50%   TEE WITHOUT CARDIOVERSION  02/12/2012   Procedure: TRANSESOPHAGEAL ECHOCARDIOGRAM (TEE);  Surgeon: Pixie Casino, MD;  Location: [facility] ENDOSCOPY;  Service: Cardiovascular;  Laterality: N/A;   TEE WITHOUT CARDIOVERSION N/A 06/22/2013   Procedure: TRANSESOPHAGEAL ECHOCARDIOGRAM (TEE);  Surgeon: [name], MD;  Location: [place];  Service: Cardiovascular;  Laterality: N/A;   THYROIDECTOMY N/A 01/15/2019   Procedure: TOTAL THYROIDECTOMY;  Surgeon:  Melida Quitter, MD;  Location: [facility] OR;  Service: ENT;  Laterality: N/A;    Allergies: Aspirin, Levaquin [levofloxacin in d5w], Nickel, Other, Simvastatin, Statins, Allopurinol, Ampicillin, and Penicillin g  Medications: Prior to Admission medications   Medication Sig Start Date End Date Taking? Authorizing Provider  acetaminophen (TYLENOL) 500 MG tablet Take 1,000 mg by mouth daily as needed for moderate pain.   Yes [provider]  alfuzosin (UROXATRAL) 10 MG 24 hr tablet Take 10 mg by mouth at bedtime.    Yes [provider]  buPROPion (WELLBUTRIN XL) 150 MG 24 hr tablet Take 1 tablet (150 mg total) by mouth daily. 11/15/20  Yes Dohmeier, Asencion Partridge, MD  Cholecalciferol (VITAMIN D) 2000 units tablet Take 2,000 Units by mouth at bedtime.    Yes [provider]  ezetimibe (ZETIA) 10 MG tablet Take 10 mg by mouth every morning.   Yes [provider]  finasteride (PROSCAR) 5 MG tablet Take 5 mg by mouth at bedtime.  09/23/14  Yes [provider]  levothyroxine (SYNTHROID) 175 MCG tablet Take 1 tablet (175 mcg total) by mouth daily. 05/25/19  Yes Gorsuch, Ni, MD  lidocaine-prilocaine (EMLA) cream Apply to affected area once before sleep time- 05/17/20  Yes Dohmeier, Asencion Partridge, MD  metoprolol succinate (TOPROL-XL) 25 MG 24 hr tablet Take 12.5 mg by mouth every evening. 06/11/16  Yes [provider]  Polyethyl Glycol-Propyl Glycol (SYSTANE HYDRATION PF OP) Place 1 drop into both eyes 3 (three) times daily as needed (dry eyes).    Yes [provider]     Family History  Problem Relation Age of Onset   Parkinson's disease Mother 62   Heart failure Father 37   Cancer Maternal Grandmother    Heart attack Maternal Grandfather    Heart attack Paternal Grandmother    Heart attack Paternal Grandfather     Social History   Socioeconomic History   Marital status: Married    Spouse name: [name]   Number of children: 2   Years of education: Not on  file   Highest education level: Not on file  Occupational History   Occupation: retired Hydrologist  Tobacco Use   Smoking status: Never   Smokeless tobacco: Never  Vaping Use   Vaping Use: Never used  Substance and Sexual Activity   Alcohol use: Not Currently    Comment: social   Drug use: No   Sexual activity: Not on file  Other Topics Concern   Not on file  Social History Narrative   Lives in [place], Retired   Science writer Determinants of Radio broadcast assistant Strain: Not on file  Food Insecurity: Not on file  Transportation Needs: Not on file  Physical Activity: Not on file  Stress: Not on file  Social Connections: Not on file    Review of Systems: A 12 point ROS discussed and pertinent positives are indicated in the HPI above.  All other systems  are negative.  Review of Systems  Constitutional:  Negative for fever.  HENT:  Negative for congestion.   Respiratory:  Negative for cough and shortness of breath.   Cardiovascular:  Negative for chest pain.  Gastrointestinal:  Negative for abdominal pain.  Neurological:  Negative for headaches.  Psychiatric/Behavioral:  Negative for behavioral problems and confusion.    Vital Signs: Ht 6\' 1"  (1.854 m)   Wt 211 lb (95.7 kg)   BMI 27.84 kg/m   Physical Exam Vitals and nursing note reviewed.  Constitutional:      Appearance: He is well-developed.  HENT:     Head: Normocephalic.     Comments: Hoarseness noted to voice.  Neck:     Comments: RIJ single lumen portacath placement. Pulmonary:     Effort: Pulmonary effort is normal.  Musculoskeletal:        General: Normal range of motion.     Cervical back: Normal range of motion.  Skin:    General: Skin is dry.  Neurological:     Mental Status: He is alert and oriented to person, place, and time.    Imaging: No results found.  Labs:  CBC: Recent Labs    05/22/20 0941 09/19/20 1056 10/31/20 0232 12/15/20 1048  WBC 4.4 4.8 5.6 5.5  HGB 12.8* 12.8* 12.7*  12.8*  HCT 38.7* 37.9* 38.1* 38.8*  PLT 151 164 190 151    COAGS: No results for input(s): INR, APTT in the last 8760 hours.  BMP: Recent Labs    05/22/20 0941 09/19/20 1056 10/31/20 0232 12/15/20 1048  NA 139 140 141 140  K 4.3 4.7 3.9 4.3  CL 107 106 107 106  CO2 25 27 25 27   GLUCOSE 112* 108* 101* 112*  BUN 20 24* 24* 19  CALCIUM 8.8* 8.8* 8.8* 9.2  CREATININE 1.21 1.27* 1.16 1.17  GFRNONAA >60 57* >60 >60    LIVER FUNCTION TESTS: Recent Labs    05/22/20 0941 09/19/20 1056 10/31/20 0232 12/15/20 1048  BILITOT 1.4* 1.3* 0.8 1.3*  AST 16 19 15 18   ALT 16 13 16 18   ALKPHOS 62 59 56 66  PROT 6.0* 6.4* 5.9* 6.3*  ALBUMIN 3.8 3.8 3.9 4.0     Assessment and Plan: 80 y.o. 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 IR placed a RIJ single lumen portacath on 3.3.20. Patient has since completed chemotherapy. Team is requesting portacath removal no longer needed.   No recent labs. All medications are within acceptable parameter. Allergies include levaquin and PCN. Patient has been NPO since midnight.  Risks and benefits of image guided port-a-catheter removal was discussed with the patient including, but not limited to bleeding, infection, pneumothorax, or fibrin sheath development and need for additional procedures.  All of the patient's questions were answered, patient is agreeable to proceed. Consent signed and in chart.   Thank you for this interesting consult.  I greatly enjoyed meeting Sean Stanley and look forward to participating in their care.  A copy of this report was sent to the requesting provider on this date.  Electronically Signed: Jacqualine Mau, NP 02/05/2021, 12:56 PM   I spent a total of  30 Minutes   in face to face in clinical consultation, greater than 50% of which was counseling/coordinating care for portacath removal

## 2021-02-05 NOTE — Procedures (Signed)
Interventional Radiology Procedure Note  Procedure: Port removal  Complications: None  Estimated Blood Loss: < 10 mL  Findings: Right chest port removed in entirety. Wound closed.  Shishir Krantz T. Samone Guhl, M.D Pager:  [phone]    

## 2021-02-09 ENCOUNTER — Other Ambulatory Visit: Payer: Self-pay

## 2021-02-09 ENCOUNTER — Other Ambulatory Visit (HOSPITAL_BASED_OUTPATIENT_CLINIC_OR_DEPARTMENT_OTHER): Payer: Self-pay

## 2021-02-09 ENCOUNTER — Ambulatory Visit: Payer: Medicare Other | Attending: Internal Medicine

## 2021-02-09 DIAGNOSIS — Z23 Encounter for immunization: Secondary | ICD-10-CM

## 2021-02-09 MED ORDER — MODERNA COVID-19 BIVAL BOOSTER 50 MCG/0.5ML IM SUSP
INTRAMUSCULAR | 0 refills | Status: DC
  Filled 2021-02-09: qty 0.5, 1d supply, fill #0

## 2021-02-09 NOTE — Progress Notes (Signed)
   Covid-19 Vaccination Clinic  Name:  Sean Stanley    MRN: [id number] DOB: 1940-08-03  02/09/2021  [name] was observed post Covid-19 immunization for 15 minutes without incident. He was provided with Vaccine Information Sheet and instruction to access the V-Safe system.   [name] was instructed to call 911 with any severe reactions post vaccine: Difficulty breathing  Swelling of face and throat  A fast heartbeat  A bad rash all over body  Dizziness and weakness   Immunizations Administered     Name Date Dose VIS Date Route   Moderna Covid-19 vaccine Bivalent Booster 02/09/2021  2:50 PM 0.5 mL 11/04/2020 Intramuscular   Manufacturer: Moderna   Lot: [id number]   [place]: [id number]

## 2021-02-13 ENCOUNTER — Other Ambulatory Visit: Payer: Medicare Other | Admitting: *Deleted

## 2021-02-13 ENCOUNTER — Encounter: Payer: Self-pay | Admitting: Pharmacist

## 2021-02-13 ENCOUNTER — Other Ambulatory Visit: Payer: Self-pay

## 2021-02-13 ENCOUNTER — Telehealth: Payer: Self-pay | Admitting: Pharmacist

## 2021-02-13 DIAGNOSIS — E785 Hyperlipidemia, unspecified: Secondary | ICD-10-CM

## 2021-02-13 DIAGNOSIS — I2510 Atherosclerotic heart disease of native coronary artery without angina pectoris: Secondary | ICD-10-CM

## 2021-02-13 LAB — LIPID PANEL
Chol/HDL Ratio: 3.2 ratio (ref 0.0–5.0)
Cholesterol, Total: 210 mg/dL — ABNORMAL HIGH (ref 100–199)
HDL: 65 mg/dL (ref >39)
LDL Chol Calc (NIH): 127 mg/dL — ABNORMAL HIGH (ref 0–99)
Triglycerides: 100 mg/dL (ref 0–149)
VLDL Cholesterol Cal: 18 mg/dL (ref 5–40)

## 2021-02-13 MED ORDER — PRAVASTATIN SODIUM 20 MG PO TABS: 20.0000 mg | ORAL_TABLET | Freq: Every evening | ORAL | 5 refills | Status: DC

## 2021-02-13 NOTE — Telephone Encounter (Signed)
Lipids rechecked, LDL has increased from 112 to 127. Pt preferred to focus on lifestyle at last lipid visit in August. LDL goal is < 70. He is intolerant to unknown dose of simvastatin and rosuvastatin.  Spoke with pt to see if he is willing to try LLT now with either pravastatin 20mg  daily or Repatha 140mg  Q2W. He is agreeable to try pravastatin. Rx sent to pharmacy, pt aware to call with any side effects. Otherwise, I will plan to reach out in 1-2 months and will schedule labs at that time. He is aware to continue on ezetimibe.

## 2021-02-21 ENCOUNTER — Ambulatory Visit: Payer: Medicare Other | Admitting: Neurology

## 2021-02-26 ENCOUNTER — Other Ambulatory Visit: Payer: Self-pay

## 2021-02-26 ENCOUNTER — Ambulatory Visit (INDEPENDENT_AMBULATORY_CARE_PROVIDER_SITE_OTHER): Payer: Medicare Other | Admitting: Internal Medicine

## 2021-02-26 VITALS — BP 112/66 | HR 69 | Ht 73.0 in | Wt 218.4 lb

## 2021-02-26 DIAGNOSIS — I480 Paroxysmal atrial fibrillation: Secondary | ICD-10-CM

## 2021-02-26 DIAGNOSIS — I2510 Atherosclerotic heart disease of native coronary artery without angina pectoris: Secondary | ICD-10-CM | POA: Diagnosis not present

## 2021-02-26 NOTE — Patient Instructions (Addendum)
Medication Instructions:  Your physician recommends that you continue on your current medications as directed. Please refer to the Current Medication list given to you today. *If you need a refill on your cardiac medications before your next appointment, please call your pharmacy*  Lab Work: None. If you have labs (blood work) drawn today and your tests are completely normal, you will receive your results only by: [place] (if you have MyChart) OR A paper copy in the mail If you have any lab test that is abnormal or we need to change your treatment, we will call you to review the results.  Testing/Procedures: None.  Follow-Up: At [facility], you and your health needs are our priority.  As part of our continuing mission to provide you with exceptional heart care, we have created designated Provider Care Teams.  These Care Teams include your primary Cardiologist (physician) and Advanced Practice Providers (APPs -  Physician Assistants and Nurse Practitioners) who all work together to provide you with the care you need, when you need it.  Your physician wants you to follow-up in: 03/29/21 at 12:15 pm with  Thompson Grayer, MD   We recommend signing up for the patient portal called "MyChart".  Sign up information is provided on this After Visit Summary.  MyChart is used to connect with patients for Virtual Visits (Telemedicine).  Patients are able to view lab/test results, encounter notes, upcoming appointments, etc.  Non-urgent messages can be sent to your provider as well.   To learn more about what you can do with MyChart, go to [email or web address].    Any Other Special Instructions Will Be Listed Below (If Applicable).  Implantable Loop Recorder Placement An implantable loop recorder is a small electronic device that is placed under the skin of your chest. The device records the electrical activity of your heart over a long period of time. Your health care provider can download  these recordings to monitor your heart. You may need an implantable loop recorder if you have periods of abnormal heart activity (arrhythmias) or unexplained fainting (syncope). The recorder can be left in place for 1 year or longer. Tell a health care provider about: Any allergies you have. All medicines you are taking, including vitamins, herbs, eye drops, creams, and over-the-counter medicines. Any problems you or family members have had with anesthetic medicines. Any bleeding problems you have. Any surgeries you have had. Any medical conditions you have. Whether you are pregnant or may be pregnant. What are the risks? Generally, this is a safe procedure. However, problems may occur, including: Infection. Bleeding. Allergic reactions to anesthetic medicines. Damage to nerves or blood vessels. Failure of the device to work. This could require another surgery to replace it. What happens before the procedure?  You may have a physical exam, blood tests, and imaging tests of your heart, such as a chest X-ray. Follow instructions from your health care provider about eating or drinking restrictions. Ask your health care provider about: Changing or stopping your regular medicines. This is especially important if you are taking diabetes medicines or blood thinners. Taking medicines such as aspirin and ibuprofen. These medicines can thin your blood. Do not take these medicines unless your health care provider tells you to take them. Taking over-the-counter medicines, vitamins, herbs, and supplements. Ask your health care provider how your surgical site will be marked or identified. Ask your health care provider what steps will be taken to help prevent infection. These may include: Removing hair at the surgery site.  Washing skin with a germ-killing soap. Plan to have someone take you home from the hospital or clinic. Plan to have a responsible adult care for you for at least 24 hours after you  leave the hospital or clinic. This is important. Do not use any products that contain nicotine or tobacco, such as cigarettes and e-cigarettes. If you need help quitting, ask your health care provider. What happens during the procedure? An IV will be inserted into one of your veins. You may be given one or more of the following: A medicine to help you relax (sedative). A medicine to numb the area (local anesthetic). A small incision will be made on the left side of your upper chest. A pocket will be created under your skin. The device will be placed in the pocket. The incision will be closed with stitches (sutures) or adhesive strips. A bandage (dressing) will be placed over the incision. The procedure may vary among health care providers and hospitals. What happens after the procedure? Your blood pressure, heart rate, breathing rate, and blood oxygen level will be monitored until you leave the hospital or clinic. You may be able to go home on the day of your surgery. Before you go home: Your health care provider will program your recorder. You will learn how to trigger your device with a handheld activator. You will learn how to send recordings to your health care provider. You will get an ID card for your device, and you will be told when to use it. Do not drive for 24 hours if you were given a sedative during your procedure. Summary An implantable loop recorder is a small electronic device that is placed under the skin of your chest to monitor your heart over a long period of time. The recorder can be left in place for 1 year or longer. Plan to have someone take you home from the hospital or clinic. This information is not intended to replace advice given to you by your health care provider. Make sure you discuss any questions you have with your health care provider. Document Revised: 07/11/2020 Document Reviewed: 07/11/2020 Elsevier Patient Education  [employer].

## 2021-02-26 NOTE — Progress Notes (Signed)
PCP: [name], MD Primary Cardiologist: Dr Edwin Dada ([place]),  Dr Johney Frame Primary EP: Dr Rayann Heman  Sean Stanley is a 80 y.o. male who presents today for routine electrophysiology followup.  Since last being seen in our clinic, the patient reports doing very well.  No afib events.  Today, he denies symptoms of palpitations, chest pain, shortness of breath,  lower extremity edema, dizziness, presyncope, or syncope.  The patient is otherwise without complaint today.   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   s/p coiling 12/16/12 - [facility]   Atrial flutter ([facility])    BPH (benign prostatic hyperplasia)    Cancer ([place])    melanoma on lower right back and left chest - surgically removed and cleared   Dysrhythmia    H/O agent Orange exposure    Headache    migraine- not current   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 Maxwell Caul told him he had improved   Pneumonia    Thoracic ascending aortic aneurysm    4.5 cm 03/2018 CT   Thyroid cancer [facility])    Past Surgical History:  Procedure Laterality Date   ABDOMINAL ANGIOGRAM  08/05/12   aneurysm repair     CARDIOVERSION  02/12/2012   Procedure: CARDIOVERSION;  Surgeon: Pixie Casino, MD;  Location: [place];  Service: Cardiovascular;   Laterality: N/A;   CARDIOVERSION N/A 06/22/2013   Procedure: CARDIOVERSION;  Surgeon: [name], MD;  Location: [place];  Service: Cardiovascular;  Laterality: N/A;   CATARACT EXTRACTION Bilateral 2018   with lens implant   CHOLECYSTECTOMY     COLONOSCOPY     ESOPHAGOGASTRODUODENOSCOPY     IR IMAGING GUIDED PORT INSERTION  05/26/2018   IR REMOVAL TUN ACCESS W/ PORT W/O FL MOD SED  02/05/2021   LYMPH NODE BIOPSY Left 04/27/2018   Procedure: EXCISIONAL BIOPSY OF LEFT CERVICAL LYMPH NODE;  Surgeon: Melida Quitter, MD;  Location: [place];  Service: ENT;  Laterality: Left;   LYMPH NODE BIOPSY Left 11/23/2018   Procedure: LEFT CERVICAL LYMPH NODE OPEN BIOPSY;  Surgeon: Melida Quitter, MD;  Location: [place];  Service: ENT;  Laterality: Left;   MENISCUS REPAIR Right 2009   MITRAL VALVE REPAIR  2002   [facility]   NM MYOVIEW LTD  07/22/2006   no ischemia   RADICAL NECK DISSECTION Left 01/15/2019   Procedure: LEFT NECK DISSECTION;  Surgeon: Melida Quitter, MD;  Location: [place];  Service: ENT;  Laterality: Left;   RIGHT HEART CATH  06/19/2004   normal right heart dynamics. EF 50%   TEE WITHOUT CARDIOVERSION  02/12/2012   Procedure: TRANSESOPHAGEAL ECHOCARDIOGRAM (TEE);  Surgeon: Pixie Casino, MD;  [facility];  Service: Cardiovascular;  Laterality: N/A;   TEE WITHOUT CARDIOVERSION N/A 06/22/2013   Procedure: TRANSESOPHAGEAL ECHOCARDIOGRAM (TEE);  Surgeon: [name], MD;  Location: [place];  Service: Cardiovascular;  Laterality: N/A;   THYROIDECTOMY N/A 01/15/2019   Procedure: TOTAL THYROIDECTOMY;  Surgeon: Melida Quitter, MD;  Location: [place];  Service: ENT;  Laterality: N/A;    ROS- all systems are reviewed and negatives except as per HPI above  Current Outpatient Medications  Medication Sig Dispense Refill   acetaminophen (TYLENOL) 500 MG tablet Take 1,000 mg by mouth daily as needed for moderate pain.     alfuzosin (UROXATRAL) 10 MG 24 hr tablet Take 10 mg by mouth at  bedtime.      buPROPion (WELLBUTRIN XL) 150 MG 24 hr tablet Take 1 tablet (150 mg total) by mouth daily. 90 tablet 3   Cholecalciferol (VITAMIN D) 2000 units tablet Take 2,000 Units by mouth at bedtime.      COVID-19 mRNA bivalent vaccine, Moderna, (MODERNA COVID-19 BIVAL BOOSTER) 50 MCG/0.5ML injection Inject into the muscle. 0.5 mL 0   ezetimibe (ZETIA) 10 MG tablet Take 10 mg by mouth every morning.     finasteride (PROSCAR) 5 MG tablet Take 5 mg by mouth at bedtime.      levothyroxine (SYNTHROID) 175 MCG tablet Take 1 tablet (175 mcg total) by mouth daily.     lidocaine-prilocaine (EMLA) cream Apply to affected area once before sleep time- 30 g 3   metoprolol succinate (TOPROL-XL) 25 MG 24 hr tablet Take 12.5 mg by mouth every evening.     Polyethyl Glycol-Propyl Glycol (SYSTANE HYDRATION PF OP) Place 1 drop into both eyes 3 (three) times daily as needed (dry eyes).      pravastatin (PRAVACHOL) 20 MG tablet Take 1 tablet (20 mg total) by mouth every evening. 30 tablet 5   No current facility-administered medications for this visit.    Physical Exam: There were no vitals filed for this visit.  GEN- The patient is well appearing, alert and oriented x 3 today.   Head- normocephalic, atraumatic Eyes-  Sclera clear, conjunctiva pink Ears- hearing intact Oropharynx- clear Lungs- Clear to ausculation bilaterally, normal work of breathing Heart- Regular rate and rhythm, no murmurs, rubs or gallops, PMI not laterally displaced GI- soft, NT, ND, + BS Extremities- no clubbing, cyanosis, or edema  Wt Readings from Last 3 Encounters:  02/05/21 211 lb (95.7 kg)  02/05/21 212 lb (96.2 kg)  12/15/20 217 lb 6.4 oz (98.6 kg)    EKG tracing ordered today is personally reviewed and shows sinus  Assessment and Plan:  Atrial fibrillation Well controlled (only a single episode after COVID/ Paxlovid).  No recurrence since last visit. Records from [place] are reviewed Chads2vasc score is 3-4. We  discussed options at length today.  He has had prior vascular aneurysmal bleeding on [place] and is nervous about [place] therapy.  He discussed watchman with Dr Meyer Cory at [facility] but is also reluctant to consider this procedure with its risks.  I am not certain that a single episode of AF in the setting of covid warrants very aggressive management.  He wore a 30 day monitor placed by Dr Edwin Dada which did not show afib.  We discussed options of REACT AF trial as well as long term monitoring with an implantable loop recorder to further characterize his afib burden.  Certainly, if he is found to have further AF, then our discussions of [place] therapy vs Watchman would  be intensified.  He and his wife will consider ILR and return for follow-up with me in 4 weeks.  Thompson Grayer MD, [facility] 02/26/2021 11:12 AM

## 2021-02-28 ENCOUNTER — Other Ambulatory Visit: Payer: Self-pay | Admitting: Internal Medicine

## 2021-02-28 ENCOUNTER — Ambulatory Visit
Admission: RE | Admit: 2021-02-28 | Discharge: 2021-02-28 | Disposition: A | Discharge status: Home / Self Care | Payer: Medicare Other | Source: Ambulatory Visit | Attending: Internal Medicine | Admitting: Internal Medicine

## 2021-02-28 DIAGNOSIS — R5383 Other fatigue: Secondary | ICD-10-CM | POA: Diagnosis not present

## 2021-02-28 DIAGNOSIS — R0781 Pleurodynia: Secondary | ICD-10-CM | POA: Diagnosis not present

## 2021-02-28 DIAGNOSIS — M791 Myalgia, unspecified site: Secondary | ICD-10-CM | POA: Diagnosis not present

## 2021-03-05 ENCOUNTER — Other Ambulatory Visit (HOSPITAL_BASED_OUTPATIENT_CLINIC_OR_DEPARTMENT_OTHER): Payer: Self-pay

## 2021-03-07 ENCOUNTER — Encounter: Payer: Self-pay | Admitting: Cardiology

## 2021-03-07 MED ORDER — EZETIMIBE 10 MG PO TABS: 10.0000 mg | ORAL_TABLET | ORAL | 2 refills | Status: AC

## 2021-03-27 ENCOUNTER — Telehealth: Payer: Self-pay | Admitting: Pharmacist

## 2021-03-27 NOTE — Telephone Encounter (Signed)
Called pt, still has muscle pain off of pravastatin. He is taking ezetimibe (LDL 112 on this therapy). Previously intolerant to simvastatin and rosuvastatin. He is hesitant to retry statin therapy or PCSK9i. Nexletol not covered as well by insurance and would be less effective than above options. Pt doesn't wish to add anything today but will let us know if he changes his mind.

## 2021-03-28 DIAGNOSIS — J383 Other diseases of vocal cords: Secondary | ICD-10-CM | POA: Diagnosis not present

## 2021-03-28 DIAGNOSIS — J384 Edema of larynx: Secondary | ICD-10-CM | POA: Diagnosis not present

## 2021-03-28 DIAGNOSIS — Z85850 Personal history of malignant neoplasm of thyroid: Secondary | ICD-10-CM | POA: Diagnosis not present

## 2021-03-28 DIAGNOSIS — J3801 Paralysis of vocal cords and larynx, unilateral: Secondary | ICD-10-CM | POA: Diagnosis not present

## 2021-03-28 DIAGNOSIS — J387 Other diseases of larynx: Secondary | ICD-10-CM | POA: Diagnosis not present

## 2021-03-28 DIAGNOSIS — L539 Erythematous condition, unspecified: Secondary | ICD-10-CM | POA: Diagnosis not present

## 2021-03-28 DIAGNOSIS — Z923 Personal history of irradiation: Secondary | ICD-10-CM | POA: Diagnosis not present

## 2021-03-29 ENCOUNTER — Encounter: Payer: Self-pay | Admitting: Internal Medicine

## 2021-03-29 ENCOUNTER — Other Ambulatory Visit: Payer: Self-pay

## 2021-03-29 ENCOUNTER — Ambulatory Visit (INDEPENDENT_AMBULATORY_CARE_PROVIDER_SITE_OTHER): Payer: Medicare Other | Admitting: Internal Medicine

## 2021-03-29 VITALS — BP 106/66 | HR 69 | Ht 73.0 in | Wt 216.0 lb

## 2021-03-29 DIAGNOSIS — I480 Paroxysmal atrial fibrillation: Secondary | ICD-10-CM

## 2021-03-29 HISTORY — PX: OTHER SURGICAL HISTORY: SHX169

## 2021-03-29 NOTE — Progress Notes (Signed)
PCP: [name], MD Primary Cardiologist: Dr Edwin Dada ([place]),  Dr Johney Frame Primary EP: Dr Rayann Heman  Sean Stanley is a 81 y.o. male who presents today for routine electrophysiology followup.  Since last being seen in our clinic, the patient reports doing very well.  Today, he denies symptoms of palpitations, chest pain, shortness of breath,  lower extremity edema, dizziness, presyncope, or syncope.  The patient is otherwise without complaint today.   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 12/16/12 - [facility]   Atrial flutter ([facility])    BPH (benign prostatic hyperplasia)    Cancer ([place])    melanoma on lower right back and left chest - surgically removed and cleared   Dysrhythmia    H/O agent Orange exposure    Headache    migraine- not current   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 Maxwell Caul told him he had improved   Pneumonia    Thoracic ascending aortic aneurysm    4.5 cm 03/2018 CT   Thyroid cancer [facility])    Past Surgical History:  Procedure Laterality Date   ABDOMINAL ANGIOGRAM  08/05/12   aneurysm repair     CARDIOVERSION  02/12/2012   Procedure: CARDIOVERSION;  Surgeon: Pixie Casino, MD;  Location: [place];  Service: Cardiovascular;  Laterality: N/A;    CARDIOVERSION N/A 06/22/2013   Procedure: CARDIOVERSION;  Surgeon: [name], MD;  Location: [place];  Service: Cardiovascular;  Laterality: N/A;   CATARACT EXTRACTION Bilateral 2018   with lens implant   CHOLECYSTECTOMY     COLONOSCOPY     ESOPHAGOGASTRODUODENOSCOPY     IR IMAGING GUIDED PORT INSERTION  05/26/2018   IR REMOVAL TUN ACCESS W/ PORT W/O FL MOD SED  02/05/2021   LYMPH NODE BIOPSY Left 04/27/2018   Procedure: EXCISIONAL BIOPSY OF LEFT CERVICAL LYMPH NODE;  Surgeon: Melida Quitter, MD;  Location: [place];  Service: ENT;  Laterality: Left;   LYMPH NODE BIOPSY Left 11/23/2018   Procedure: LEFT CERVICAL LYMPH NODE OPEN BIOPSY;  Surgeon: Melida Quitter, MD;  Location: [place];  Service: ENT;  Laterality: Left;   MENISCUS REPAIR Right 2009   MITRAL VALVE REPAIR  2002   [facility]   NM MYOVIEW LTD  07/22/2006   no ischemia   RADICAL NECK DISSECTION Left 01/15/2019   Procedure: LEFT NECK DISSECTION;  Surgeon: Melida Quitter, MD;  Location: [place];  Service: ENT;  Laterality: Left;   RIGHT HEART CATH  06/19/2004   normal right heart dynamics. EF 50%   TEE WITHOUT CARDIOVERSION  02/12/2012   Procedure: TRANSESOPHAGEAL ECHOCARDIOGRAM (TEE);  Surgeon: Pixie Casino, MD;  Location: [facility];  Service: Cardiovascular;  Laterality: N/A;   TEE WITHOUT CARDIOVERSION N/A 06/22/2013   Procedure: TRANSESOPHAGEAL ECHOCARDIOGRAM (TEE);  Surgeon: [name], MD;  Location: [place];  Service: Cardiovascular;  Laterality: N/A;   THYROIDECTOMY N/A 01/15/2019   Procedure: TOTAL THYROIDECTOMY;  Surgeon: Melida Quitter, MD;  Location: [place];  Service: ENT;  Laterality: N/A;    ROS- all systems are reviewed and negatives except as per HPI above  Current Outpatient Medications  Medication Sig Dispense Refill   acetaminophen (TYLENOL) 500 MG tablet Take 1,000 mg by mouth daily as needed for moderate pain.     alfuzosin (UROXATRAL) 10 MG 24 hr tablet Take 10 mg by mouth at bedtime.       buPROPion (WELLBUTRIN XL) 150 MG 24 hr tablet Take 1 tablet (150 mg total) by mouth daily. 90 tablet 3   Cholecalciferol (VITAMIN D) 2000 units tablet Take 2,000 Units by mouth at bedtime.      COVID-19 mRNA bivalent vaccine, Moderna, (MODERNA COVID-19 BIVAL BOOSTER) 50 MCG/0.5ML injection Inject into the muscle. 0.5 mL 0   ezetimibe (ZETIA) 10 MG tablet Take 1 tablet (10 mg total) by mouth every morning. 90 tablet 2   finasteride (PROSCAR) 5 MG tablet Take 5 mg by mouth at bedtime.      levothyroxine (SYNTHROID) 175 MCG tablet Take 1 tablet (175 mcg total) by mouth daily.     lidocaine-prilocaine (EMLA) cream Apply to affected area once before sleep time- 30 g 3   metoprolol succinate (TOPROL-XL) 25 MG 24 hr tablet Take 12.5 mg by mouth every evening.     Polyethyl Glycol-Propyl Glycol (SYSTANE HYDRATION PF OP) Place 1 drop into both eyes 3 (three) times daily as needed (dry eyes).      No current facility-administered medications for this visit.    Physical Exam: Vitals:   03/29/21 1223  BP: 106/66  Pulse: 69  SpO2: 96%  Weight: 216 lb (98 kg)  Height: 6\' 1"  (1.854 m)    GEN- The patient is well appearing, alert and oriented x 3 today.   Head- normocephalic, atraumatic Eyes-  Sclera clear, conjunctiva pink Ears- hearing intact Oropharynx- clear Lungs- Clear to ausculation bilaterally, normal work of breathing Heart- Regular rate and rhythm, no murmurs, rubs or gallops, PMI not laterally displaced GI- soft, NT, ND, + BS Extremities- no clubbing, cyanosis, or edema  Wt Readings from Last 3 Encounters:  03/29/21 216 lb (98 kg)  02/26/21 218 lb 6.4 oz (99.1 kg)  02/05/21 211 lb (95.7 kg)    EKG tracing ordered today is personally reviewed and shows sinus  Assessment and Plan:  Atrial fibrillation Only a single episode after COVID/ Paxlovid.  Chads2vasc score is 3-4.  He worries about risks of [place] long time as well as risks of stroke off of [place].  He has had prior vascular  aneurysmal bleeding on [place] and is nervous about [place] therapy.  He discussed watchman with Dr Meyer Cory at [facility] but is also reluctant to consider this procedure with its risks. I would therefore advise implantation of an implantable loop recorder for long term arrhythmia monitoring.  Risks and benefits to ILR were discussed at length with the patient today, including but not limited to risks of bleeding and infection.  Extensive device education was performed.  Remote monitoring was also discussed at length today.  The patient understands and wishes to proceed.  We will proceed at this time with ILR implantation.   Thompson Grayer MD, [facility] 03/29/2021 12:28  PM      DESCRIPTION OF PROCEDURE:  Informed written consent was obtained.  The patient required no sedation for the procedure today.  The patients left chest was prepped and draped. Mapping over the patient's chest was performed to identify the appropriate ILR site.  This area was found to be the left  parasternal region over the 3rd-4th intercostal space.  The skin overlying this region was infiltrated with lidocaine for local analgesia.  A 0.5-cm incision was made at the implant site.  A subcutaneous ILR pocket was fashioned using a combination of sharp and blunt dissection.  A Medtronic Reveal Linq model [id number]) implantable loop recorder was then placed into the pocket R waves were very prominent and measured > 0.2 mV. EBL<1 ml.  Steri- Strips and a sterile dressing were then applied.  There were no early apparent complications.     CONCLUSIONS:   1. Successful implantation of a Medtronic Reveal LINQ implantable loop recorder for monitoring for afib to guide anticoagulation going forward  2. No early apparent complications.   Thompson Grayer MD, [facility] 03/29/2021 12:28 PM

## 2021-03-29 NOTE — Patient Instructions (Signed)
Medication Instructions:  Your physician recommends that you continue on your current medications as directed. Please refer to the Current Medication list given to you today.  Labwork: None ordered.  Testing/Procedures: None ordered.  Follow-Up:  Your physician wants you to follow-up in: 6 months with Dr. Thompson Grayer.   You will receive a reminder letter in the mail two months in advance. If you don't receive a letter, please call our office to schedule the follow-up appointment.    Implantable Loop Recorder Placement, Care After This sheet gives you information about how to care for yourself after your procedure. Your health care provider may also give you more specific instructions. If you have problems or questions, contact your health care provider. What can I expect after the procedure? After the procedure, it is common to have: Soreness or discomfort near the incision. Some swelling or bruising near the incision.  Follow these instructions at home: Incision care   Leave your outer dressing on for 24 hours.  After 24 hours you can remove your outer dressing and shower. Leave adhesive strips in place. These skin closures may need to stay in place for 1-2 weeks. If adhesive strip edges start to loosen and curl up, you may trim the loose edges.  You may remove the strips if they have not fallen off after 2 weeks. Check your incision area every day for signs of infection. Check for: Redness, swelling, or pain. Fluid or blood. Warmth. Pus or a bad smell. Do not take baths, swim, or use a hot tub until your incision is completely healed. If your wound site starts to bleed apply pressure.      If you have any questions/concerns please call the device clinic at [phone].  Activity  Return to your normal activities.  General instructions Follow instructions from your health care provider about how to manage your implantable loop recorder and transmit the information.  Learn how to activate a recording if this is necessary for your type of device. You may go through a metal detection gate, and you may let someone hold a metal detector over your chest. Show your ID card if needed. Do not have an MRI unless you check with your health care provider first. Take over-the-counter and prescription medicines only as told by your health care provider. Keep all follow-up visits as told by your health care provider. This is important. Contact a health care provider if: You have redness, swelling, or pain around your incision. You have a fever. You have pain that is not relieved by your pain medicine. You have triggered your device because of fainting (syncope) or because of a heartbeat that feels like it is racing, slow, fluttering, or skipping (palpitations). Get help right away if you have: Chest pain. Difficulty breathing. Summary After the procedure, it is common to have soreness or discomfort near the incision. Change your dressing as told by your health care provider. Follow instructions from your health care provider about how to manage your implantable loop recorder and transmit the information. Keep all follow-up visits as told by your health care provider. This is important. This information is not intended to replace advice given to you by your health care provider. Make sure you discuss any questions you have with your health care provider. Document Released: 02/20/2015 Document Revised: 04/26/2017 Document Reviewed: 04/26/2017 Elsevier Patient Education  2020 [employer].

## 2021-03-30 DIAGNOSIS — R221 Localized swelling, mass and lump, neck: Secondary | ICD-10-CM | POA: Diagnosis not present

## 2021-03-30 DIAGNOSIS — R0781 Pleurodynia: Secondary | ICD-10-CM | POA: Diagnosis not present

## 2021-03-30 DIAGNOSIS — E7800 Pure hypercholesterolemia, unspecified: Secondary | ICD-10-CM | POA: Diagnosis not present

## 2021-04-03 ENCOUNTER — Encounter: Payer: Self-pay | Admitting: Hematology and Oncology

## 2021-04-03 ENCOUNTER — Telehealth: Payer: Self-pay | Admitting: Hematology and Oncology

## 2021-04-03 ENCOUNTER — Telehealth: Payer: Self-pay

## 2021-04-03 ENCOUNTER — Inpatient Hospital Stay: Payer: Medicare Other | Attending: Hematology and Oncology | Admitting: Hematology and Oncology

## 2021-04-03 ENCOUNTER — Other Ambulatory Visit: Payer: Self-pay

## 2021-04-03 ENCOUNTER — Inpatient Hospital Stay: Payer: Medicare Other

## 2021-04-03 VITALS — BP 126/77 | HR 69 | Resp 18 | Ht 73.0 in | Wt 215.8 lb

## 2021-04-03 DIAGNOSIS — C8318 Mantle cell lymphoma, lymph nodes of multiple sites: Secondary | ICD-10-CM

## 2021-04-03 DIAGNOSIS — I82C12 Acute embolism and thrombosis of left internal jugular vein: Secondary | ICD-10-CM | POA: Insufficient documentation

## 2021-04-03 DIAGNOSIS — N400 Enlarged prostate without lower urinary tract symptoms: Secondary | ICD-10-CM | POA: Insufficient documentation

## 2021-04-03 DIAGNOSIS — I517 Cardiomegaly: Secondary | ICD-10-CM | POA: Insufficient documentation

## 2021-04-03 DIAGNOSIS — I2510 Atherosclerotic heart disease of native coronary artery without angina pectoris: Secondary | ICD-10-CM | POA: Insufficient documentation

## 2021-04-03 DIAGNOSIS — C8310 Mantle cell lymphoma, unspecified site: Secondary | ICD-10-CM | POA: Insufficient documentation

## 2021-04-03 DIAGNOSIS — K4090 Unilateral inguinal hernia, without obstruction or gangrene, not specified as recurrent: Secondary | ICD-10-CM | POA: Diagnosis not present

## 2021-04-03 DIAGNOSIS — J3801 Paralysis of vocal cords and larynx, unilateral: Secondary | ICD-10-CM | POA: Insufficient documentation

## 2021-04-03 DIAGNOSIS — J984 Other disorders of lung: Secondary | ICD-10-CM | POA: Diagnosis not present

## 2021-04-03 DIAGNOSIS — Z881 Allergy status to other antibiotic agents status: Secondary | ICD-10-CM | POA: Diagnosis not present

## 2021-04-03 DIAGNOSIS — Z79899 Other long term (current) drug therapy: Secondary | ICD-10-CM | POA: Insufficient documentation

## 2021-04-03 DIAGNOSIS — Z880 Allergy status to penicillin: Secondary | ICD-10-CM | POA: Diagnosis not present

## 2021-04-03 DIAGNOSIS — I700 Atherosclerosis of aorta: Secondary | ICD-10-CM | POA: Diagnosis not present

## 2021-04-03 DIAGNOSIS — I7121 Aneurysm of the ascending aorta, without rupture: Secondary | ICD-10-CM | POA: Insufficient documentation

## 2021-04-03 DIAGNOSIS — C73 Malignant neoplasm of thyroid gland: Secondary | ICD-10-CM | POA: Insufficient documentation

## 2021-04-03 DIAGNOSIS — Z886 Allergy status to analgesic agent status: Secondary | ICD-10-CM | POA: Diagnosis not present

## 2021-04-03 DIAGNOSIS — Z888 Allergy status to other drugs, medicaments and biological substances status: Secondary | ICD-10-CM | POA: Insufficient documentation

## 2021-04-03 DIAGNOSIS — Z9049 Acquired absence of other specified parts of digestive tract: Secondary | ICD-10-CM | POA: Insufficient documentation

## 2021-04-03 DIAGNOSIS — R911 Solitary pulmonary nodule: Secondary | ICD-10-CM | POA: Insufficient documentation

## 2021-04-03 DIAGNOSIS — I890 Lymphedema, not elsewhere classified: Secondary | ICD-10-CM | POA: Insufficient documentation

## 2021-04-03 DIAGNOSIS — M47812 Spondylosis without myelopathy or radiculopathy, cervical region: Secondary | ICD-10-CM | POA: Insufficient documentation

## 2021-04-03 LAB — CMP (CANCER CENTER ONLY)
ALT: 17 U/L (ref 0–44)
AST: 18 U/L (ref 15–41)
Albumin: 4.2 g/dL (ref 3.5–5.0)
Alkaline Phosphatase: 59 U/L (ref 38–126)
Anion gap: 7 (ref 5–15)
BUN: 27 mg/dL — ABNORMAL HIGH (ref 8–23)
CO2: 27 mmol/L (ref 22–32)
Calcium: 9 mg/dL (ref 8.9–10.3)
Chloride: 105 mmol/L (ref 98–111)
Creatinine: 1.24 mg/dL (ref 0.61–1.24)
GFR, Estimated: 59 mL/min — ABNORMAL LOW (ref >60)
Glucose, Bld: 103 mg/dL — ABNORMAL HIGH (ref 70–99)
Potassium: 4.2 mmol/L (ref 3.5–5.1)
Sodium: 139 mmol/L (ref 135–145)
Total Bilirubin: 1.4 mg/dL — ABNORMAL HIGH (ref 0.3–1.2)
Total Protein: 6.3 g/dL — ABNORMAL LOW (ref 6.5–8.1)

## 2021-04-03 LAB — CBC WITH DIFFERENTIAL (CANCER CENTER ONLY)
Abs Immature Granulocytes: 0.03 10*3/uL (ref 0.00–0.07)
Basophils Absolute: 0.1 10*3/uL (ref 0.0–0.1)
Basophils Relative: 1 %
Eosinophils Absolute: 0.2 10*3/uL (ref 0.0–0.5)
Eosinophils Relative: 3 %
HCT: 40.1 % (ref 39.0–52.0)
Hemoglobin: 13.2 g/dL (ref 13.0–17.0)
Immature Granulocytes: 1 %
Lymphocytes Relative: 19 %
Lymphs Abs: 1.1 10*3/uL (ref 0.7–4.0)
MCH: 30.2 pg (ref 26.0–34.0)
MCHC: 32.9 g/dL (ref 30.0–36.0)
MCV: 91.8 fL (ref 80.0–100.0)
Monocytes Absolute: 0.6 10*3/uL (ref 0.1–1.0)
Monocytes Relative: 11 %
Neutro Abs: 3.9 10*3/uL (ref 1.7–7.7)
Neutrophils Relative %: 65 %
Platelet Count: 166 10*3/uL (ref 150–400)
RBC: 4.37 MIL/uL (ref 4.22–5.81)
RDW: 12.9 % (ref 11.5–15.5)
WBC Count: 5.8 10*3/uL (ref 4.0–10.5)
nRBC: 0 % (ref 0.0–0.2)

## 2021-04-03 LAB — LACTATE DEHYDROGENASE: LDH: 154 U/L (ref 98–192)

## 2021-04-03 NOTE — Assessment & Plan Note (Signed)
The patient had history of stage III mantle cell lymphoma I am not able to appreciate new lymphadenopathy on the left side of his neck although the submandibular gland appears somewhat prominent His prior imaging study did not include imaging study of the neck He remains at risk of recurrence I recommend blood work today and CT imaging next week to assess and he is in agreement

## 2021-04-03 NOTE — Telephone Encounter (Signed)
Called and left a message asking them to call the office back. Offering appt today at 1:30 pm with Dr. Alvy Bimler, arrive at 1 pm.

## 2021-04-03 NOTE — Progress Notes (Signed)
[facility] OFFICE PROGRESS NOTE  Patient Care Team: [name], MD as PCP - General (Internal Medicine) Blazing, Venia Carbon, MD as PCP - Cardiology (Cardiology) Festus Aloe, MD as Referring Physician (Hematology and Oncology)  ASSESSMENT & PLAN:  Mantle cell lymphoma [facility]) The patient had history of stage III mantle cell lymphoma I am not able to appreciate new lymphadenopathy on the left side of his neck although the submandibular gland appears somewhat prominent His prior imaging study did not include imaging study of the neck He remains at risk of recurrence I recommend blood work today and CT imaging next week to assess and he is in agreement   Thyroid cancer [facility]) The patient had history of thyroid cancer treated adequately with surgery Is being monitored closely Due to high risk of cancer recurrence, as above, I plan to order CT imaging of the neck, chest, abdomen and pelvis for evaluation  Orders Placed This Encounter  Procedures   CT CHEST ABDOMEN PELVIS W CONTRAST    Standing Status:   Future    Standing Expiration Date:   04/03/2022    Order Specific Question:   Preferred imaging location?    Answer:   [facility]    Order Specific Question:   Radiology Contrast Protocol - do NOT remove file path    Answer:   [email or web address]   CT Soft Tissue Neck W Contrast    Standing Status:   Future    Standing Expiration Date:   04/03/2022    Order Specific Question:   If indicated for the ordered procedure, I authorize the administration of contrast media per Radiology protocol    Answer:   Yes    Order Specific Question:   Preferred imaging location?    Answer:   [facility]   Lactate dehydrogenase    Standing Status:   Future    Number of Occurrences:   1    Standing Expiration Date:   04/03/2022    All questions were answered. The patient knows to call the clinic with any problems,  questions or concerns. The total time spent in the appointment was 30 minutes encounter with patients including review of chart and various tests results, discussions about plan of care and coordination of care plan   Heath Lark, MD 04/03/2021 3:18 PM  INTERVAL HISTORY: Please see below for problem oriented charting. he returns to be seen urgently due to self palpated abnormalities on the left side of his neck The patient have history of mantle cell lymphoma and thyroid cancer status post surgery He has noticed prominence on the left side of his neck and the lump near the submandibular region He also have fleeting discomfort of his abdomen and inguinal region His appetite is fair He denies recent weight loss Denies recent infection  REVIEW OF SYSTEMS:   Constitutional: Denies fevers, chills or abnormal weight loss Eyes: Denies blurriness of vision Ears, nose, mouth, throat, and face: Denies mucositis or sore throat Respiratory: Denies cough, dyspnea or wheezes Cardiovascular: Denies palpitation, chest discomfort or lower extremity swelling Gastrointestinal:  Denies nausea, heartburn or change in bowel habits Skin: Denies abnormal skin rashes Lymphatics: Denies new lymphadenopathy or easy bruising Neurological:Denies numbness, tingling or new weaknesses Behavioral/Psych: Mood is stable, no new changes  All other systems were reviewed with the patient and are negative.  I have reviewed the past medical history, past surgical history, social history and family history with the patient and they are unchanged from  previous note.  ALLERGIES:  is allergic to aspirin, levaquin [levofloxacin in d5w], nickel, other, pravastatin, simvastatin, statins, allopurinol, ampicillin, and penicillin g.  MEDICATIONS:  Current Outpatient Medications  Medication Sig Dispense Refill   acetaminophen (TYLENOL) 500 MG tablet Take 1,000 mg by mouth daily as needed for moderate pain.     alfuzosin (UROXATRAL)  10 MG 24 hr tablet Take 10 mg by mouth at bedtime.      buPROPion (WELLBUTRIN XL) 150 MG 24 hr tablet Take 1 tablet (150 mg total) by mouth daily. 90 tablet 3   Cholecalciferol (VITAMIN D) 2000 units tablet Take 2,000 Units by mouth at bedtime.      COVID-19 mRNA bivalent vaccine, Moderna, (MODERNA COVID-19 BIVAL BOOSTER) 50 MCG/0.5ML injection Inject into the muscle. 0.5 mL 0   ezetimibe (ZETIA) 10 MG tablet Take 1 tablet (10 mg total) by mouth every morning. 90 tablet 2   finasteride (PROSCAR) 5 MG tablet Take 5 mg by mouth at bedtime.      levothyroxine (SYNTHROID) 175 MCG tablet Take 1 tablet (175 mcg total) by mouth daily.     lidocaine-prilocaine (EMLA) cream Apply to affected area once before sleep time- 30 g 3   metoprolol succinate (TOPROL-XL) 25 MG 24 hr tablet Take 12.5 mg by mouth every evening.     Polyethyl Glycol-Propyl Glycol (SYSTANE HYDRATION PF OP) Place 1 drop into both eyes 3 (three) times daily as needed (dry eyes).      No current facility-administered medications for this visit.    SUMMARY OF ONCOLOGIC HISTORY: Oncology History Overview Note  MIPI score 7.5 high risk   Mantle cell lymphoma ([place])  06/13/2017 Imaging   Ct imaging at [facility] 1. 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 2. 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 3. Slight reduction in size of right retroperitoneal evolving hematoma.   03/30/2018 Imaging   Ct neck 1. Extensive adenopathy on the left. The largest node is a level 1/submandibular node measuring 38 x 24 x 22 mm. Largest level 2 node measures 3.2 x 2 x 2 cm. Numerous other smaller but round lymph nodes throughout the left neck in the level 2 through level 4 region. The largest level 5 node measures 3.4 x 3.4 x 2.3 cm with a transverse diameter of 2.3 cm. This contains some  internal calcifications. There are numerous other pathologic nodes in the left supraclavicular to axillary region. This pattern of disease could be due to extensive left neck metastatic disease from unknown primary, lymphoma, or other systemic malignancy. 2. No evidence of mucosal or submucosal lesion. 3. Diameter of the ascending aorta is 4.5 cm. Recommend annual imaging followup by CTA or MRA. Aortic Atherosclerosis (ICD10-I70.0).     04/10/2018 Pathology Results   Left zone 1 neck mass, Fine Needle Aspiration I (smears and cell block):      Atypical lymphoid proliferation. See comment.       Specimen Adequacy:  Satisfactory for evaluation.  COMMENT:The aspirate demonstrates abundant small to medium sized lymphocytes with scattered epithelioid cells in the background which may be histiocytes.  The smears are cellular, but the cell block includes scant lymphocytes.  Immunohistochemical stains were attempted showing positive staining for CD20 with rare staining for CD3.  Cytokeratin AE1/AE3 is negative. S100 shows high nonspecific background staining but no specifically diagnostic cellular staining.  Overall, the findings  indicate an atypical lymphoid proliferation but are too limited for definitive diagnosis.  Excisional biopsy with flow cytometry is recommended.   04/10/2018 Procedure   He was ENT who performed FNA of neck LN   04/27/2018 Pathology Results   Lymph node for lymphoma, [name], [name], [name], [name], [name], which suggests the potential for a more aggressive  nature. CD3 highlights residual T-lymphocytes. By flow cytometry, [name], the features are consistent with a mantle cell lymphoma   05/13/2018 PET scan   1. Hypermetabolic left cervical lymphadenopathy, left axillary lymphadenopathy, mediastinal / right hilar lymphadenopathy, right external iliac lymphadenopathy, and subcentimeter left external iliac and left inguinal lymph nodes, [name], [name], [name], without focal lesions, that may represent lymphomatous involvement of the spleen.   05/21/2018 Cancer Staging   Staging form: Hodgkin and Non-Hodgkin Lymphoma, AJCC 8th Edition - Clinical: Stage III - Signed by Heath Lark, MD on 05/21/2018    05/26/2018 Procedure   Ultrasound and fluoroscopically guided right internal jugular single lumen power port catheter insertion. Tip in the SVC/RA junction. Catheter ready for use.     06/01/2018 -  Chemotherapy   The patient had Bendamustine and Rituximab for chemotherapy treatment   08/24/2018 PET scan   1. Partial response to therapy with complete resolution of size and metabolic activity of the dominant LEFT submandibular node (level 1) and LEFT axillary nodes. 2. Persistent and increased metabolic activity of left level 3 lymph nodes ( [place]ville 4). Nodes are partially calcified and similar size. 3. No new metastatic disease. 4. Normal spleen and bone marrow.   11/09/2018 PET scan   IMPRESSION: 1. Mild response to therapy of hypermetabolic left cervical nodes. ([place]) 4. 2. No new or progressive disease. 3. Coronary artery atherosclerosis. Aortic Atherosclerosis (ICD10-I70.0). 4. Trace cul-de-sac fluid, similar.   12/18/2018 Imaging   1. 4 mm solid nodule in the right lower lobe, which is technically indeterminate.  Recommend attention on follow-up per clinical protocol. 2. Small calcified nodule adjacent to the left lobe of the thyroid, which may represent thyroid nodule or calcified lymph node. Recommend correlation with thyroid ultrasound. 3. The ascending aorta appears mildly dilated, measuring up to 4.2cm. 4. Decreased left axillary lymphadenopathy.   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   Stranding with postsurgical changes along the right anterior neck anterior to the thyroid cartilage, favored to be related to recent surgery. Attention on follow-up is suggested.   No suspicious lymphadenopathy in the neck, chest, abdomen, or pelvis. Deauville criteria 1   11/22/2019 Imaging   1. No evidence of recurrent lymphoma in the chest, abdomen or pelvis. Normal size spleen. 2. 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 Recommend annual imaging followup by CTA or MRA. This recommendation follows 2010 CCF/AHA/AATS/ACR/ASA/SCA/SCAI/SIR/STS/SVM Guidelines for the Diagnosis and Management of Patients with Thoracic Aortic Disease. Circulation. 2010; 121: V672-C947. Aortic aneurysm  NOS (ICD10-I71.9). Moderate diffuse colonic diverticulosis. Mild prostatomegaly. Aortic Atherosclerosis (ICD10-I70.0).   05/30/2020 Imaging   Stable exam. No evidence of recurrent lymphoma or metastatic disease within the chest, abdomen, or pelvis.   Colonic diverticulosis, without radiographic evidence of diverticulitis.   Tiny nonobstructing left renal calculus.   Stable mildly enlarged prostate.   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   Aortic and coronary atherosclerotic calcification.   02/05/2021 Procedure   Removal of implanted Port-A-Cath utilizing sharp and blunt dissection. The procedure was uncomplicated.   Thyroid cancer ([place])  11/23/2018 Pathology Results   Lymph node for lymphoma, Left  zone 3 - PAPILLARY THYROID CARCINOMA. - SEE MICROSCOPIC DESCRIPTION. Microscopic Comment The specimen consists mostly of encapsulated papillary thyroid carcinoma. There are portions of lymphoid tissue attached to the periphery of the specimen and in the adjacent adipose tissue which suggests that this may represent papillary carcinoma metastatic to a lymph node.   11/23/2018 Surgery   PREOPERATIVE DIAGNOSIS:  Mantle cell lymphoma and cervical lymphadenopathy.   POSTOPERATIVE DIAGNOSIS:  Mantle cell lymphoma and cervical lymphadenopathy.   PROCEDURE:  Excisional biopsy of left cervical lymph nodes.     01/15/2019 Pathology Results   A. SOFT TISSUE, NECK, ADJACENT TO LEFT RECURRENT NERVE, BIOPSY: - Papillary thyroid carcinoma. B. 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 C. ADDITIONAL LEFT LEVEL 4 TISSUE, EXCISION: - There is no evidence of carcinoma in 2 of 2 lymph nodes (0/2). D. TOTAL THYROIDECTOMY: - Papillary thyroid carcinoma, 0.8 cm. - Carcinoma is broadly present at an inked tissue edge. - See oncology table below. E. LEFT LEVEL 6 TISSUE, EXCISION: - Benign fibroadipose tissue. - There is no evidence of malignancy. F. LEFT RECURRENT NERVE AND SURROUNDING TISSUE, EXCISION: - Metastatic papillary thyroid carcinoma in 3 of 3 lymph nodes (3/3).  THYROID GLAND: Procedure: Thyroidectomy Tumor Focality: Unifocal Tumor Site: Left lobe Tumor Size: 0.8 cm Histologic Type: 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Margins: Carcinoma is broadly present at a black inked tissue edge. 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   PROCEDURE:  Total thyroidectomy and left modified radical neck dissection.   SURGEON:  Melida Quitter, MD     FINDINGS:  There was a firm nodule in zone IIB on the left side and also some firm tissue and adherence to the jugular vein in zone III region requiring removal of a portion of the vein.  There was a firm nodule in the left thyroid lobe that extended posterior to the gland, adhering to the trachea, and encompassing the recurrent laryngeal nerve.  Tissue was sent for frozen section from adjacent to the nerve, demonstrating papillary carcinoma.  The nerve stimulated distal to the mass but not proximal to the mass; thus, the nerve was removed with the mass.  The left-sided parathyroid glands were visualized, as was the right superior parathyroid gland.  The right recurrent nerve was kept safe and stimulated well at the end of  the case.   05/30/2020 Imaging   Negative for recurrent mass or adenopathy in the neck   Left vocal cord paresis.     PHYSICAL EXAMINATION: ECOG PERFORMANCE STATUS: 1 - Symptomatic but completely ambulatory  Vitals:   04/03/21 1312  BP: 126/77  Pulse: 69  Resp: 18  SpO2: 96%   Filed Weights   04/03/21 1312  Weight: 215 lb 12.8 oz (97.9 kg)    GENERAL:alert, no distress and comfortable SKIN: skin color, texture, turgor are normal, no rashes or significant lesions EYES: normal, Conjunctiva are pink and non-injected, sclera clear OROPHARYNX:no exudate, no erythema and lips, buccal mucosa, and tongue normal  NECK: Noted well-healed surgical scar on the left I am not able to appreciate any abnormalities except for slight lymphedema and prominent submandibular  lymph gland LYMPH:  no palpable lymphadenopathy in the cervical, axillary or inguinal LUNGS: clear to auscultation and percussion with normal breathing effort HEART: regular rate & rhythm and no murmurs and no lower extremity edema ABDOMEN:abdomen soft, non-tender and normal bowel sounds Musculoskeletal:no cyanosis of digits and no clubbing  NEURO: alert & oriented x 3 with fluent speech, no focal motor/sensory deficits  LABORATORY DATA:  I have reviewed the data as listed    Component Value Date/Time   NA 139 04/03/2021 1348   K 4.2 04/03/2021 1348   CL 105 04/03/2021 1348   CO2 27 04/03/2021 1348   GLUCOSE 103 (H) 04/03/2021 1348   BUN 27 (H) 04/03/2021 1348   CREATININE 1.24 04/03/2021 1348   CALCIUM 9.0 04/03/2021 1348   PROT 6.3 (L) 04/03/2021 1348   PROT 6.6 07/23/2017 1504   ALBUMIN 4.2 04/03/2021 1348   AST 18 04/03/2021 1348   ALT 17 04/03/2021 1348   ALKPHOS 59 04/03/2021 1348   BILITOT 1.4 (H) 04/03/2021 1348   GFRNONAA 59 (L) 04/03/2021 1348   GFRAA >60 11/22/2019 1023    No results found for: SPEP, UPEP  Lab Results  Component Value Date   WBC 5.8 04/03/2021   NEUTROABS 3.9 04/03/2021   HGB 13.2 04/03/2021   HCT 40.1 04/03/2021   MCV 91.8 04/03/2021   PLT 166 04/03/2021      Chemistry      Component Value Date/Time   NA 139 04/03/2021 1348   K 4.2 04/03/2021 1348   CL 105 04/03/2021 1348   CO2 27 04/03/2021 1348   BUN 27 (H) 04/03/2021 1348   CREATININE 1.24 04/03/2021 1348      Component Value Date/Time   CALCIUM 9.0 04/03/2021 1348   ALKPHOS 59 04/03/2021 1348   AST 18 04/03/2021 1348   ALT 17 04/03/2021 1348   BILITOT 1.4 (H) 04/03/2021 1348

## 2021-04-03 NOTE — Assessment & Plan Note (Signed)
The patient had history of thyroid cancer treated adequately with surgery Is being monitored closely Due to high risk of cancer recurrence, as above, I plan to order CT imaging of the neck, chest, abdomen and pelvis for evaluation

## 2021-04-03 NOTE — Telephone Encounter (Signed)
Sch per 1/9 inbasket, ok per brenda, left msg

## 2021-04-04 ENCOUNTER — Encounter: Payer: Self-pay | Admitting: Hematology and Oncology

## 2021-04-05 ENCOUNTER — Encounter: Payer: Self-pay | Admitting: Hematology and Oncology

## 2021-04-09 ENCOUNTER — Encounter: Payer: Self-pay | Admitting: Hematology and Oncology

## 2021-04-10 ENCOUNTER — Other Ambulatory Visit: Payer: Self-pay

## 2021-04-10 ENCOUNTER — Encounter (HOSPITAL_BASED_OUTPATIENT_CLINIC_OR_DEPARTMENT_OTHER): Payer: Self-pay

## 2021-04-10 ENCOUNTER — Ambulatory Visit (HOSPITAL_BASED_OUTPATIENT_CLINIC_OR_DEPARTMENT_OTHER)
Admission: RE | Admit: 2021-04-10 | Discharge: 2021-04-10 | Disposition: A | Discharge status: Home / Self Care | Payer: Medicare Other | Source: Ambulatory Visit | Attending: Hematology and Oncology | Admitting: Hematology and Oncology

## 2021-04-10 DIAGNOSIS — C8318 Mantle cell lymphoma, lymph nodes of multiple sites: Secondary | ICD-10-CM | POA: Insufficient documentation

## 2021-04-10 DIAGNOSIS — J3800 Paralysis of vocal cords and larynx, unspecified: Secondary | ICD-10-CM | POA: Diagnosis not present

## 2021-04-10 DIAGNOSIS — N400 Enlarged prostate without lower urinary tract symptoms: Secondary | ICD-10-CM | POA: Diagnosis not present

## 2021-04-10 DIAGNOSIS — J984 Other disorders of lung: Secondary | ICD-10-CM | POA: Diagnosis not present

## 2021-04-10 DIAGNOSIS — C73 Malignant neoplasm of thyroid gland: Secondary | ICD-10-CM | POA: Diagnosis not present

## 2021-04-10 DIAGNOSIS — K5730 Diverticulosis of large intestine without perforation or abscess without bleeding: Secondary | ICD-10-CM | POA: Diagnosis not present

## 2021-04-10 DIAGNOSIS — I700 Atherosclerosis of aorta: Secondary | ICD-10-CM | POA: Diagnosis not present

## 2021-04-10 DIAGNOSIS — C8310 Mantle cell lymphoma, unspecified site: Secondary | ICD-10-CM | POA: Diagnosis not present

## 2021-04-10 DIAGNOSIS — K4090 Unilateral inguinal hernia, without obstruction or gangrene, not specified as recurrent: Secondary | ICD-10-CM | POA: Diagnosis not present

## 2021-04-10 MED ORDER — IOHEXOL 300 MG/ML  SOLN
100.0000 mL | Freq: Once | INTRAMUSCULAR | Status: AC | PRN
  Administered 2021-04-10: 100 mL via INTRAVENOUS

## 2021-04-12 ENCOUNTER — Other Ambulatory Visit: Payer: Self-pay

## 2021-04-12 ENCOUNTER — Inpatient Hospital Stay (HOSPITAL_BASED_OUTPATIENT_CLINIC_OR_DEPARTMENT_OTHER): Payer: Medicare Other | Admitting: Hematology and Oncology

## 2021-04-12 ENCOUNTER — Encounter: Payer: Self-pay | Admitting: Hematology and Oncology

## 2021-04-12 DIAGNOSIS — I890 Lymphedema, not elsewhere classified: Secondary | ICD-10-CM

## 2021-04-12 DIAGNOSIS — C73 Malignant neoplasm of thyroid gland: Secondary | ICD-10-CM | POA: Diagnosis not present

## 2021-04-12 DIAGNOSIS — C8318 Mantle cell lymphoma, lymph nodes of multiple sites: Secondary | ICD-10-CM

## 2021-04-12 DIAGNOSIS — I7121 Aneurysm of the ascending aorta, without rupture: Secondary | ICD-10-CM | POA: Diagnosis not present

## 2021-04-12 DIAGNOSIS — C8310 Mantle cell lymphoma, unspecified site: Secondary | ICD-10-CM | POA: Diagnosis not present

## 2021-04-12 DIAGNOSIS — I700 Atherosclerosis of aorta: Secondary | ICD-10-CM | POA: Diagnosis not present

## 2021-04-12 DIAGNOSIS — I2510 Atherosclerotic heart disease of native coronary artery without angina pectoris: Secondary | ICD-10-CM | POA: Diagnosis not present

## 2021-04-12 NOTE — Assessment & Plan Note (Signed)
The cause of his recent abnormal thickening or palpable lump on the neck is likely due to acquired lymphedema The patient is reassured

## 2021-04-12 NOTE — Assessment & Plan Note (Signed)
I have reviewed recent blood work and CT imaging with the patient and his wife There is no signs of lymphoma recurrence I will see him again in 6 months for further follow-up

## 2021-04-12 NOTE — Assessment & Plan Note (Signed)
He is noted to have stable aortic aneurysm He will continue active surveillance and medical management

## 2021-04-12 NOTE — Assessment & Plan Note (Signed)
He has no signs of cancer recurrence in his neck The patient is reassured

## 2021-04-12 NOTE — Progress Notes (Signed)
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[facility]) I have reviewed recent blood work and CT imaging with the patient and his wife There is no signs of lymphoma recurrence I will see him again in 6 months for further follow-up  Thyroid cancer [facility]) He has no signs of cancer recurrence in his neck The patient is reassured  Ascending aortic aneurysm [facility]) He is noted to have stable aortic aneurysm He will continue active surveillance and medical management  Acquired lymphedema The cause of his recent abnormal thickening or palpable lump on the neck is likely due to acquired lymphedema The patient is reassured  No orders of the defined types were placed in this encounter.   All questions were answered. The patient knows to call the clinic with any problems, questions or concerns. The total time spent in the appointment was 30 minutes encounter with patients including review of chart and various tests results, discussions about plan of care and coordination of care plan   Heath Lark, MD 04/12/2021 5:33 PM  INTERVAL HISTORY: Please see below for problem oriented charting. he returns for review of test results He is here accompanied by his wife He has no new symptoms since last time I saw him  REVIEW OF SYSTEMS:   Constitutional: Denies fevers, chills or abnormal weight loss Eyes: Denies blurriness of vision Ears, nose, mouth, throat, and face: Denies mucositis or sore throat Respiratory: Denies cough, dyspnea or wheezes Cardiovascular: Denies palpitation, chest discomfort or lower extremity swelling Gastrointestinal:  Denies nausea, heartburn or change in bowel habits Skin: Denies abnormal skin rashes Lymphatics: Denies new  lymphadenopathy or easy bruising Neurological:Denies numbness, tingling or new weaknesses Behavioral/Psych: Mood is stable, no new changes  All other systems were reviewed with the patient and are negative.  I have reviewed the past medical history, past surgical history, social history and family history with the patient and they are unchanged from previous note.  ALLERGIES:  is allergic to aspirin, levaquin [levofloxacin in d5w], nickel, other, pravastatin, simvastatin, statins, allopurinol, ampicillin, and penicillin g.  MEDICATIONS:  Current Outpatient Medications  Medication Sig Dispense Refill   acetaminophen (TYLENOL) 500 MG tablet Take 1,000 mg by mouth daily as needed for moderate pain.     alfuzosin (UROXATRAL) 10 MG 24 hr tablet Take 10 mg by mouth at bedtime.      buPROPion (WELLBUTRIN XL) 150 MG 24 hr tablet Take 1 tablet (150 mg total) by mouth daily. 90 tablet 3   Cholecalciferol (VITAMIN D) 2000 units tablet Take 2,000 Units by mouth at bedtime.      COVID-19 mRNA bivalent vaccine, Moderna, (MODERNA COVID-19 BIVAL BOOSTER) 50 MCG/0.5ML injection Inject into the muscle. 0.5 mL 0   ezetimibe (ZETIA) 10 MG tablet Take 1 tablet (10 mg total) by mouth every morning. 90 tablet 2   finasteride (PROSCAR) 5 MG tablet Take 5 mg by mouth at bedtime.      levothyroxine (SYNTHROID) 175 MCG tablet Take 1 tablet (175 mcg total) by mouth daily.     lidocaine-prilocaine (EMLA) cream Apply to affected area once before sleep time- 30 g 3   metoprolol succinate (TOPROL-XL) 25 MG 24 hr tablet Take 12.5 mg by mouth every evening.     Polyethyl Glycol-Propyl Glycol (SYSTANE HYDRATION PF  OP) Place 1 drop into both eyes 3 (three) times daily as needed (dry eyes).      No current facility-administered medications for this visit.    SUMMARY OF ONCOLOGIC HISTORY: Oncology History Overview Note  MIPI score 7.5 high risk   Mantle cell lymphoma ([place])  06/13/2017 Imaging   Ct imaging at [facility] 1.  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 2. 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 3. Slight reduction in size of right retroperitoneal evolving hematoma.   03/30/2018 Imaging   Ct neck 1. Extensive adenopathy on the left. The largest node is a level 1/submandibular node measuring 38 x 24 x 22 mm. Largest level 2 node measures 3.2 x 2 x 2 cm. Numerous other smaller but round lymph nodes throughout the left neck in the level 2 through level 4 region. The largest level 5 node measures 3.4 x 3.4 x 2.3 cm with a transverse diameter of 2.3 cm. This contains some internal calcifications. There are numerous other pathologic nodes in the left supraclavicular to axillary region. This pattern of disease could be due to extensive left neck metastatic disease from unknown primary, lymphoma, or other systemic malignancy. 2. No evidence of mucosal or submucosal lesion. 3. Diameter of the ascending aorta is 4.5 cm. Recommend annual imaging followup by CTA or MRA. Aortic Atherosclerosis (ICD10-I70.0).     04/10/2018 Pathology Results   Left zone 1 neck mass, Fine Needle Aspiration I (smears and cell block):      Atypical lymphoid proliferation. See comment.       Specimen Adequacy:  Satisfactory for evaluation.  COMMENT:The aspirate demonstrates abundant small to medium sized lymphocytes with scattered epithelioid cells in the background which may be histiocytes.  The smears are cellular, but the cell block includes scant lymphocytes.  Immunohistochemical stains were attempted showing positive staining for CD20 with rare staining for CD3.  Cytokeratin AE1/AE3 is negative. S100 shows high nonspecific background staining but no specifically diagnostic cellular staining.  Overall, the findings indicate an atypical lymphoid proliferation but are  too limited for definitive diagnosis.  Excisional biopsy with flow cytometry is recommended.   04/10/2018 Procedure   He was ENT who performed FNA of neck LN   04/27/2018 Pathology Results   Lymph node for lymphoma, [name], [name], [name], [name], [name], [name], [name], the features are consistent with a mantle cell lymphoma   05/13/2018 PET scan   1. Hypermetabolic left cervical lymphadenopathy, left axillary lymphadenopathy, mediastinal / right hilar lymphadenopathy, right external iliac lymphadenopathy, and subcentimeter left external iliac and left inguinal lymph nodes, [name], [name], [name], without focal lesions, that may represent lymphomatous involvement of the spleen.   05/21/2018 Cancer Staging   Staging form: Hodgkin  and Non-Hodgkin Lymphoma, AJCC 8th Edition - Clinical: Stage III - Signed by Heath Lark, MD on 05/21/2018    05/26/2018 Procedure   Ultrasound and fluoroscopically guided right internal jugular single lumen power port catheter insertion. Tip in the SVC/RA junction. Catheter ready for use.     06/01/2018 -  Chemotherapy   The patient had Bendamustine and Rituximab for chemotherapy treatment   08/24/2018 PET scan   1. Partial  response to therapy with complete resolution of size and metabolic activity of the dominant LEFT submandibular node (level 1) and LEFT axillary nodes. 2. Persistent and increased metabolic activity of left level 3 lymph nodes ( Deauville 4). Nodes are partially calcified and similar size. 3. No new metastatic disease. 4. Normal spleen and bone marrow.   11/09/2018 PET scan   IMPRESSION: 1. Mild response to therapy of hypermetabolic left cervical nodes. (De[place]ville) 4. 2. No new or progressive disease. 3. Coronary artery atherosclerosis. Aortic Atherosclerosis (ICD10-I70.0). 4. Trace cul-de-sac fluid, similar.   12/18/2018 Imaging   1. 4 mm solid nodule in the right lower lobe, which is technically indeterminate. Recommend attention on follow-up per clinical protocol. 2. Small calcified nodule adjacent to the left lobe of the thyroid, which may represent thyroid nodule or calcified lymph node. Recommend correlation with thyroid ultrasound. 3. The ascending aorta appears mildly dilated, measuring up to 4.2cm. 4. Decreased left axillary lymphadenopathy.   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   Stranding with postsurgical changes along the right anterior neck anterior to the thyroid cartilage, favored to be related to recent surgery. Attention on follow-up is suggested.   No suspicious lymphadenopathy in the neck, chest, abdomen, or pelvis. Deauville criteria 1   11/22/2019 Imaging   1. No evidence of recurrent lymphoma in the chest, abdomen or pelvis. Normal size spleen. 2. 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 Recommend annual imaging followup by CTA or MRA. This recommendation follows 2010 [facility]/AHA/AATS/ACR/ASA/SCA/SCAI/SIR/STS/SVM Guidelines for the Diagnosis and Management of Patients with Thoracic Aortic Disease. Circulation. 2010; 121: G626-R485. Aortic aneurysm  NOS (ICD10-I71.9). Moderate diffuse colonic diverticulosis. Mild prostatomegaly. Aortic Atherosclerosis (ICD10-I70.0).   05/30/2020 Imaging   Stable exam. No evidence of recurrent lymphoma or metastatic disease within the chest, abdomen, or pelvis.   Colonic diverticulosis, without radiographic evidence of diverticulitis.   Tiny nonobstructing left renal calculus.   Stable mildly enlarged prostate.   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   Aortic and coronary atherosclerotic calcification.   02/05/2021 Procedure   Removal of implanted Port-A-Cath utilizing sharp and blunt dissection. The procedure was uncomplicated.   04/11/2021 Imaging   1. No evidence of mass or lymphadenopathy in the chest, abdomen, or pelvis. 2. Normal spleen. 3. Unchanged enlargement of the tubular ascending thoracic aorta, measuring up to 4.4 x 4.4 cm.  4. Cardiomegaly and coronary artery disease.     Thyroid cancer ([place])  11/23/2018 Pathology Results   Lymph node for lymphoma, Left zone 3 - PAPILLARY THYROID CARCINOMA. - SEE MICROSCOPIC DESCRIPTION. Microscopic Comment The specimen consists mostly of encapsulated papillary thyroid carcinoma. There are portions of lymphoid tissue attached to the periphery of the specimen and in the adjacent adipose tissue which suggests that this may represent papillary carcinoma metastatic to a lymph node.   11/23/2018 Surgery   PREOPERATIVE DIAGNOSIS:  Mantle cell lymphoma and cervical lymphadenopathy.   POSTOPERATIVE DIAGNOSIS:  Mantle cell lymphoma and cervical lymphadenopathy.  PROCEDURE:  Excisional biopsy of left cervical lymph nodes.     01/15/2019 Pathology Results   A. SOFT TISSUE, NECK, ADJACENT TO LEFT RECURRENT NERVE, BIOPSY: - Papillary thyroid carcinoma. B. 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 C. ADDITIONAL LEFT LEVEL 4 TISSUE, EXCISION: - There is no evidence  of carcinoma in 2 of 2 lymph nodes (0/2). D. TOTAL THYROIDECTOMY: - Papillary thyroid carcinoma, 0.8 cm. - Carcinoma is broadly present at an inked tissue edge. - See oncology table below. E. LEFT LEVEL 6 TISSUE, EXCISION: - Benign fibroadipose tissue. - There is no evidence of malignancy. F. LEFT RECURRENT NERVE AND SURROUNDING TISSUE, EXCISION: - Metastatic papillary thyroid carcinoma in 3 of 3 lymph nodes (3/3).  THYROID GLAND: Procedure: Thyroidectomy Tumor Focality: Unifocal Tumor Site: Left lobe Tumor Size: 0.8 cm Histologic Type: 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Margins: Carcinoma is broadly present at a black inked tissue edge. 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   PROCEDURE:  Total thyroidectomy and left modified radical neck dissection.   SURGEON:  Melida Quitter, MD     FINDINGS:  There was a firm nodule in zone IIB on the left side and also some firm tissue and adherence to the jugular vein in zone III region requiring removal of a portion of the vein.  There was a firm nodule in the left thyroid lobe that extended posterior to the gland, adhering to the trachea, and encompassing the recurrent laryngeal nerve.  Tissue was sent for frozen section from adjacent to the nerve, demonstrating papillary carcinoma.  The nerve stimulated distal to the mass but not proximal to the mass; thus, the  nerve was removed with the mass.  The left-sided parathyroid glands were visualized, as was the right superior parathyroid gland.  The right recurrent nerve was kept safe and stimulated well at the end of the case.   05/30/2020 Imaging   Negative for recurrent mass or adenopathy in the neck   Left vocal cord paresis.   04/03/2021 Cancer Staging   Staging form: Thyroid - Differentiated, AJCC 8th Edition - Pathologic stage from 04/03/2021: pT1a, pN1, cM0 - Signed by Heath Lark, MD on 04/03/2021 Lymph node metastasis: Present    04/11/2021 Imaging   No change from the prior study.  No recurrent mass or adenopathy   Postop changes left neck, stable   Chronic paresis left vocal cord unchanged.     PHYSICAL EXAMINATION: ECOG PERFORMANCE STATUS: 1 - Symptomatic but completely ambulatory  Vitals:   04/12/21 1309  BP: 124/74  Pulse: 67  Resp: 18  Temp: 97.7 F (36.5 C)  SpO2: 97%   Filed Weights   04/12/21 1309  Weight: 220 lb 6.4 oz (100 kg)    GENERAL:alert, no distress and comfortable SKIN: skin color, texture, turgor are normal, no rashes or significant lesions EYES: normal, Conjunctiva are pink and non-injected, sclera clear OROPHARYNX:no exudate, no erythema and lips, buccal mucosa, and tongue normal  NECK: supple, thyroid normal size, non-tender, without nodularity LYMPH:  no palpable lymphadenopathy in the cervical, axillary or inguinal LUNGS: clear to auscultation and percussion with normal breathing effort HEART: regular rate & rhythm and no murmurs and no lower extremity edema ABDOMEN:abdomen soft, non-tender and normal bowel sounds Musculoskeletal:no cyanosis of digits and no clubbing  NEURO: alert & oriented x 3 with fluent speech, no focal motor/sensory deficits  LABORATORY DATA:  I have reviewed the data as listed    Component Value Date/Time   NA 139 04/03/2021 1348   K 4.2 04/03/2021 1348   CL 105 04/03/2021 1348   CO2 27 04/03/2021 1348   GLUCOSE 103 (H)  04/03/2021 1348   BUN 27 (H) 04/03/2021 1348   CREATININE 1.24 04/03/2021 1348   CALCIUM 9.0 04/03/2021 1348   PROT 6.3 (L) 04/03/2021 1348   PROT 6.6 07/23/2017 1504   ALBUMIN 4.2 04/03/2021 1348   AST 18 04/03/2021 1348   ALT 17 04/03/2021 1348   ALKPHOS 59 04/03/2021 1348   BILITOT 1.4 (H) 04/03/2021 1348   GFRNONAA 59 (L) 04/03/2021 1348   GFRAA >60 11/22/2019 1023    No results found for: SPEP, UPEP  Lab Results  Component Value Date   WBC 5.8 04/03/2021   NEUTROABS 3.9 04/03/2021   HGB 13.2 04/03/2021   HCT 40.1 04/03/2021   MCV 91.8 04/03/2021   PLT 166 04/03/2021      Chemistry      Component Value Date/Time   NA 139 04/03/2021 1348   K 4.2 04/03/2021 1348   CL 105 04/03/2021 1348   CO2 27 04/03/2021 1348   BUN 27 (H) 04/03/2021 1348   CREATININE 1.24 04/03/2021 1348      Component Value Date/Time   CALCIUM 9.0 04/03/2021 1348   ALKPHOS 59 04/03/2021 1348   AST 18 04/03/2021 1348   ALT 17 04/03/2021 1348   BILITOT 1.4 (H) 04/03/2021 1348       RADIOGRAPHIC STUDIES: I have reviewed multiple imaging studies with the patient and his wife I have personally reviewed the radiological images as listed and agreed with the findings in the report. CT Soft Tissue Neck W Contrast  Result Date: 04/10/2021 CLINICAL DATA:  History of thyroid cancer and lymphoma. Left neck lump. EXAM: CT NECK WITH CONTRAST TECHNIQUE: Multidetector CT imaging of the neck was performed using the standard protocol following the bolus administration of intravenous contrast. RADIATION DOSE REDUCTION: This exam was performed according to the departmental dose-optimization program which includes automated exposure control, adjustment of the mA and/or kV according to patient size and/or use of iterative reconstruction technique. CONTRAST:  129mL OMNIPAQUE IOHEXOL 300 MG/ML  SOLN COMPARISON:  CT neck 05/30/2020 FINDINGS: Pharynx and larynx: Negative for pharyngeal mass. Asymmetric enlargement of the  left piriform sinus unchanged from the prior study. Enlargement of the laryngeal ventricle on the left. Chronic thinning of the left vocal cord. No change from the prior study. Salivary glands: No inflammation, mass, or stone. Thyroid: Postop thyroidectomy.  No recurrent thyroid mass. Lymph nodes: No recurrent lymphadenopathy. Prior neck surgery on the left. There is stranding around the left sternocleidomastoid muscle extending into the platysmas muscle. No change from prior study. Findings compatible with scarring and treated adenopathy. Vascular: Left jugular vein appears occluded and may have been resected. Right jugular vein patent. Limited intracranial: Negative Visualized orbits: No orbital mass.  Bilateral cataract extraction Mastoids and visualized paranasal sinuses: Paranasal sinuses clear. Mastoid clear. Skeleton: Mild cervical spondylosis.  No acute  skeletal abnormality. Upper chest: CT chest reported separately Other: Streak artifact throughout the study. The arms were L2 above the head for the chest CT. IMPRESSION: No change from the prior study.  No recurrent mass or adenopathy Postop changes left neck, stable Chronic paresis left vocal cord unchanged. Electronically Signed   By: Franchot Gallo M.D.   On: 04/10/2021 17:50   CT CHEST ABDOMEN PELVIS W CONTRAST  Result Date: 04/10/2021 CLINICAL DATA:  Mantle cell lymphoma EXAM: CT CHEST, ABDOMEN, AND PELVIS WITH CONTRAST TECHNIQUE: Multidetector CT imaging of the chest, abdomen and pelvis was performed following the standard protocol during bolus administration of intravenous contrast. RADIATION DOSE REDUCTION: This exam was performed according to the departmental dose-optimization program which includes automated exposure control, adjustment of the mA and/or kV according to patient size and/or use of iterative reconstruction technique. CONTRAST:  164mL OMNIPAQUE IOHEXOL 300 MG/ML SOLN, additional oral enteric contrast COMPARISON:  05/30/2020  FINDINGS: CT CHEST FINDINGS Cardiovascular: Aortic atherosclerosis. Unchanged enlargement of the tubular ascending thoracic aorta, measuring up to 4.4 x 4.4 cm. Cardiomegaly. Three-vessel coronary artery calcifications. No pericardial effusion. Mediastinum/Nodes: No enlarged mediastinal, hilar, or axillary lymph nodes. Thyroid gland, trachea, and esophagus demonstrate no significant findings. Lungs/Pleura: Mild, bandlike scarring of the bilateral lung bases no pleural effusion or pneumothorax. Musculoskeletal: No chest wall mass or suspicious osseous lesions identified. CT ABDOMEN PELVIS FINDINGS Hepatobiliary: No focal liver abnormality is seen. Status post cholecystectomy. No biliary dilatation. Pancreas: Unremarkable. No pancreatic ductal dilatation or surrounding inflammatory changes. Spleen: Normal in size without significant abnormality. Adrenals/Urinary Tract: Adrenal glands are unremarkable. Kidneys are normal, without renal calculi, solid lesion, or hydronephrosis. Bladder is unremarkable. Stomach/Bowel: Stomach is within normal limits. Appendix appears normal. No evidence of bowel wall thickening, distention, or inflammatory changes. Sigmoid diverticula. Vascular/Lymphatic: Aortic atherosclerosis. Coil material in the vicinity of the splenic hilum (series 6, image 56) no enlarged abdominal or pelvic lymph nodes. Reproductive: Mild prostatomegaly. Other: Fat containing left inguinal hernia.  No ascites. Musculoskeletal: No acute osseous findings. IMPRESSION: 1. No evidence of mass or lymphadenopathy in the chest, abdomen, or pelvis. 2. Normal spleen. 3. Unchanged enlargement of the tubular ascending thoracic aorta, measuring up to 4.4 x 4.4 cm. Recommend annual imaging followup by CTA or MRA. This recommendation follows 2010 ACCF/AHA/AATS/ACR/ASA/SCA/SCAI/SIR/STS/SVM Guidelines for the Diagnosis and Management of Patients with Thoracic Aortic Disease. Circulation. 2010; 121: N989-Q119. Aortic aneurysm NOS  (ICD10-I71.9) 4. Cardiomegaly and coronary artery disease. Aortic Atherosclerosis (ICD10-I70.0). Electronically Signed   By: Delanna Ahmadi M.D.   On: 04/10/2021 17:20

## 2021-05-02 ENCOUNTER — Ambulatory Visit (INDEPENDENT_AMBULATORY_CARE_PROVIDER_SITE_OTHER): Payer: Medicare Other

## 2021-05-02 DIAGNOSIS — I480 Paroxysmal atrial fibrillation: Secondary | ICD-10-CM

## 2021-05-03 LAB — CUP PACEART REMOTE DEVICE CHECK
Date Time Interrogation Session: 20230208185341
Implantable Pulse Generator Implant Date: 20230105

## 2021-05-07 NOTE — Progress Notes (Signed)
[facility] Summary Report / Loop Recorder 

## 2021-05-21 ENCOUNTER — Encounter (INDEPENDENT_AMBULATORY_CARE_PROVIDER_SITE_OTHER): Payer: Medicare Other | Admitting: Ophthalmology

## 2021-05-21 ENCOUNTER — Other Ambulatory Visit: Payer: Self-pay

## 2021-05-21 DIAGNOSIS — H33303 Unspecified retinal break, bilateral: Secondary | ICD-10-CM | POA: Diagnosis not present

## 2021-05-21 DIAGNOSIS — H43813 Vitreous degeneration, bilateral: Secondary | ICD-10-CM

## 2021-05-22 ENCOUNTER — Encounter: Payer: Self-pay | Admitting: Neurology

## 2021-05-22 ENCOUNTER — Ambulatory Visit (INDEPENDENT_AMBULATORY_CARE_PROVIDER_SITE_OTHER): Payer: Medicare Other | Admitting: Neurology

## 2021-05-22 DIAGNOSIS — C8318 Mantle cell lymphoma, lymph nodes of multiple sites: Secondary | ICD-10-CM | POA: Diagnosis not present

## 2021-05-22 MED ORDER — BUPROPION HCL ER (XL) 150 MG PO TB24: 150.0000 mg | ORAL_TABLET | Freq: Every day | ORAL | 3 refills | Status: AC

## 2021-05-22 MED ORDER — LIDOCAINE-PRILOCAINE 2.5-2.5 % EX CREA: TOPICAL_CREAM | CUTANEOUS | 3 refills | Status: DC

## 2021-05-22 NOTE — Progress Notes (Addendum)
SLEEP MEDICINE CLINIC   Provider:  Larey Seat, M D  Primary Care Physician:  [name], MD   Referring Provider: [name], MD   Chief Complaint  Patient presents with   Follow-up    Rm 11, alone. Pt had an episode of atrial afib and had a loop monitor placed in about 6 weeks ago. [place]   RV 05-17-2021: Benson Porcaro, now 81 years old,  is seen here for his RV, scheduled , non urgent-  Atrial fibrillation, single episode  recorded in ED, loop recorder now.   Last HST did not show apnea, 11-2020.  He was told about the watchman procedure. He is here to discuss benefit and risks of anticoagulation in his history of aneurysm. We reviewed his echocardiogram and there was no note of any clot, he has mild dilation of the aorta. I think he is safe to anti-coagulate. He is leaning towards no anticoagulation.  MOCA testing today was stable, 28/ 30.   I have the pleasure of seeing Mr. Elio Haden again on 05-17-2020-  A 81 year old  Caucasian gentleman, who had a another rough year.  His neuropathy is progression, from the toes ascending above the ankle, pain keeps him awake at night. 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 In February 2020 left neck node lymph node excision for biopsy diagnosed with mantle cell B non-Hodgkin's lymphoma treated here locally at [facility] lung cancer center has a Port-A-Cath has been on rituximab a drug we are very familiar with the neurology as well.  July 2020 completion of his chemotherapy and now considered in remission.  In July 2021 he was still in remission, had a port- a -cath removed. [place], [place]- treatable , not cureable.   He takes Tylenol and CBD oil skin treatment. Tried Voltaren - he is only in pain at rest and not while up and about. Left side worse then the right. Creepy crawly sensation- RLS ? He massages it to get relief.    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  EMG evaluation of the right lower extremity shows chronic stable distal signs of neuropathic denervation consistent with the diagnosis of peripheral neuropathy.  There is no evidence of an overlying lumbosacral radiculopathy. [name] 09/02/2017 4:06 PM      He is retired from the [employer] as of September 1988 he was diagnosed with a peripheral neuropathy at the in the lower extremities bilateral in October 2002 he had a mitral valve repair, in August 2011 arthroscopic meniscus shaving, in November 2013 atrial flutter cardioversion in May 2014 pressure peritoneal abdominal bleed, coiling of an aneurysm in the abdomen September 2014, November 2014 atrial flutter again with cardioversion, January 2015 cholecystectomy May 2015 and another atrial flutter at this time ablation procedure.  Cataract surgeries in 2018 . In February 2020 left neck node lymph node excision for biopsy diagnosed with mantle cell B non-Hodgkin's lymphoma treated here locally at [facility] cancer center has a Port-A-Cath has been on rituximab a drug we are very familiar with the neurology as well.  July 2020 completion of his chemotherapy and now considered in remission.   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  However he is speaking portable and in this quiet room I can hear him very well.  In May of this year he had to take bite and was treated preventively with doxycycline and in October 2021 he is planned to have an enlarged prostate procedure.  He also had a [facility] directed exam for high-frequency hearing loss.  Medication list is mildly unchanged.  He  applies EMLA to the affected area, he has to supplement thyroid hormone now with Synthroid 175 mcg daily he has dry eyes until he takes polyethylene glycol appropriately.  He is on Wellbutrin and this works out well.  Acetaminophen as needed Zetia at bedtime, Proscar at bedtime.  Has difficulties sleeping. 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 He is reluctant to speak as people lean in to hear him, and he  Worries about Covid.  Mother had parkinson's disease.     Video visit 08-2018 History of Present Illness: patient with neuropathy and recently diagnosed Lymphoma, having success with neuropathy control by walking regularly, only needing occasionally tylenol.  Observations/Objective: reports improvement in sleep duration and quality. Patient presents well groomed, in no acute distress.  Mr. Baruc Tugwell is a 81 year old Caucasian right-handed gentleman with an underlying medical history of non-Hodgkin lymphoma, neuropathy, he was evaluated for sleep apnea which was mild and strictly positional without associated hypoxemia and there were no PLM's.  Yet his neuropathy has kept him from sleeping well and he has reported very fragmented sleep.  Dr. 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  This is a neuropathy form that can be seen with agent orange exposure, which the patient had during the [place] War.  This neuropathy has ascended over the last 25 years slowly but steadily.     RV 04-30-2018, COBIN CADAVID is a 81 y.o. male , seen here as in a referral  from [name] and upon recommendation of [name] for evaluation of neuropathy. Neuropathy has impaired his sleep. He is relieved to hear that his apnea was strictly positional, no hypoxemia and no PLMs. He had nocturia and is followed by Dr Marveen Reeks. He has just heard from his ENT physician that he has Non- Hodkins Lymphoma, a neck lymph node, which was felt and found  enlarged by CT. [name]. This is a shock. He had a MOCA today, 26/ 30 points.       HPI:  TAMARCUS CONDIE is a 81 y.o. male , seen here as in a referral  from [name] and upon recommendation of [name] for evaluation of neuropathy.  Neuropathy has impaired his sleep. He is relieved to hear that his apnea was strictly positional, no hypoxemia and no PLMs. He had nocturia and is followed by Dr Marveen Reeks.   Today's 28 October 2017, and that he was summarized the patient's baseline polysomnography on 12 September 2017.  The apnea hypopnea index was 18.2, in supine position 75.8 apneas per hour, in nonsupine position 2.7/h.  There was no REM exenteration.  There was no evidence of hypoxemia time spent below 89% oxygen saturation was 0.1-minute.  The patient had only 5 periodic limb movements but many spontaneous arousals that may have been pain or discomfort related, the EKG was not keeping with a sinus rhythm he had some intermittent PACs and PVCs.  Facet the patient would not require  CPAP intervention if he could train him to sleep on his side.  We discussed the tennis ball method or other behavior changing methods to help him avoid supine sleep.  I would also mentioned to say that he will sleep better once he is entrained into the sleep position.   His wife was also interested in the neuropathy work up- he has only had a low Vit D level, no autoimmune evidence. His EMG was interrepted by Dr Jannifer Franklin. No radiculopathy- he has sensory issues, pain and feels numb in both feet.  He likes the foot spa, jet massage . Wearing supportive shoes, treating pain.  Using trazodone for sleep- avoiding anticholinergic agents.  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 Wife asked about hemp topical oils.   I have the pleasure of seeing Mr. Bezek as a new patient to mostly practice on 23 Jul 2017.  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  About 25 years ago he began noticing numbness in his toes which from their ascended to the lower extremities.  It has not affected the upper extremities.  He does not have an associated skin rash.  He does not have raynaudes syndrome.  He has noted that when he is physically more active and more on his feet he will have more pain at night and sometimes also has muscle spasms in the lower extremities especially the gastrocnemius, described as a charley horse. He has been woken by these cramps.  It comes and goes, not lasting long but very, very painful.He is of [place] ancestry, [place].    Chief complaint according to patient : sleep disorder, neuropathy, agent orange exposure.  Sleep habits are as follows: The patient's bedtime is between 10 and 10:30 PM, the bedroom is cool, quiet dark. He sleeps within 15 minutes, but wakes up for 2-3 times due to nocturia. He has increased exercise recently, feels this improved his sleep time. Fragmented sleep, total  Sleep time of 5 hours in a good night. Naps in daytime whenever he sits still. Wakes not refreshed, excessive daytime sleepiness. Wife stated he snores. -Was placed once on CPAP but quit using it. No family history of insomnia or neuropathy.   Sleep medical history and family sleep history:   Atrial fibrillation,  Dr. Esau Grew. cardioablation after many cardioversions has held up! mother had parkinson's disease. No family history of NP , no genetic testing.  Had Agent orange exposure.  This can be a VA claim .      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  No smoking history, ETOH : drinks hard liquor, now only wine . Caffeine ; 1 cup in PM, decaffeinated- soda and no iced teas.     Review of Systems: Out of a complete 14 system review, the  patient complains of only the following symptoms, and all other reviewed systems are negative.  Insomnia, painful neuropathy, thyroid cancer , Lymphoma.    Social History   Socioeconomic History   Marital status: Married    Spouse name: [name]   Number of children: 2   Years of education: Not on file   Highest education level: Not on file  Occupational History   Occupation: retired Hydrologist  Tobacco Use   Smoking status: Never   Smokeless tobacco: Never  Vaping Use   Vaping Use: Never used  Substance and Sexual Activity   Alcohol use: Not Currently    Comment: social   Drug use: No   Sexual activity: Not on file  Other Topics Concern   Not on file  Social History Narrative   Lives in [place], Retired   Social Determinants of Health   Financial Resource Strain: Not on file  Food Insecurity: Not on file  Transportation Needs: Not on file  Physical Activity: Not on file  Stress: Not on file  Social Connections: Not on file  Intimate Partner Violence: Not on file    Family History  Problem Relation Age of Onset   Parkinson's disease Mother 97   Heart failure Father 70   Cancer Maternal Grandmother    Heart attack Maternal Grandfather    Heart attack Paternal Grandmother    Heart attack Paternal Grandfather     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[place])    followed at [facility]   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 [facility])    being followed at [facility]   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   s/p coiling 12/16/12 - [facility]   Atrial flutter ([facility])    BPH (benign prostatic hyperplasia)    Cancer ([facility]CC)    melanoma on lower right back and left chest - surgically removed and cleared   Dysrhythmia    H/O agent  Orange exposure    Headache    migraine- not current   High bilirubin    pt states it's genetic   History of blood transfusion    Hypercholesteremia    Hypercholesterolemia    Lymphoma ([place]) 04/2018   Mitral valve disease    annuloplasty 2002 [facility]   Neuropathy    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 Maxwell Caul told him he had improved   Pneumonia    Thoracic ascending aortic aneurysm    4.5 cm 03/2018 CT   Thyroid cancer [facility])     Past Surgical History:  Procedure Laterality Date   ABDOMINAL ANGIOGRAM  08/05/2012   aneurysm repair     CARDIOVERSION  02/12/2012   Procedure: CARDIOVERSION;  Surgeon: Pixie Casino, MD;  Location: [facility] ENDOSCOPY;  Service: Cardiovascular;  Laterality: N/A;   CARDIOVERSION N/A 06/22/2013   Procedure: CARDIOVERSION;  Surgeon: [name], MD;  Location: [place];  Service: Cardiovascular;  Laterality: N/A;   CATARACT EXTRACTION Bilateral 2018   with lens implant   CHOLECYSTECTOMY     COLONOSCOPY     ESOPHAGOGASTRODUODENOSCOPY     implantable loop recorder placement  03/29/2021   Medtronic Reveal Linq model LNQ 22 [id number]) implantable loop recorder   IR IMAGING GUIDED PORT INSERTION  05/26/2018   IR REMOVAL TUN ACCESS W/ PORT W/O FL MOD SED  02/05/2021   LYMPH NODE BIOPSY Left 04/27/2018   Procedure: EXCISIONAL BIOPSY OF LEFT CERVICAL LYMPH NODE;  Surgeon: Melida Quitter, MD;  Location: [place];  Service: ENT;  Laterality: Left;   LYMPH NODE BIOPSY Left 11/23/2018   Procedure:  LEFT CERVICAL LYMPH NODE OPEN BIOPSY;  Surgeon: Melida Quitter, MD;  Location: [place];  Service: ENT;  Laterality: Left;   MENISCUS REPAIR Right 2009   MITRAL VALVE REPAIR  2002   [facility]   NM MYOVIEW LTD  07/22/2006   no ischemia   RADICAL NECK DISSECTION Left 01/15/2019   Procedure: LEFT NECK DISSECTION;  Surgeon: Melida Quitter, MD;  Location: [place];  Service: ENT;   Laterality: Left;   RIGHT HEART CATH  06/19/2004   normal right heart dynamics. EF 50%   TEE WITHOUT CARDIOVERSION  02/12/2012   Procedure: TRANSESOPHAGEAL ECHOCARDIOGRAM (TEE);  Surgeon: Pixie Casino, MD;  Location: [facility] ENDOSCOPY;  Service: Cardiovascular;  Laterality: N/A;   TEE WITHOUT CARDIOVERSION N/A 06/22/2013   Procedure: TRANSESOPHAGEAL ECHOCARDIOGRAM (TEE);  Surgeon: [name], MD;  Location: [place];  Service: Cardiovascular;  Laterality: N/A;   THYROIDECTOMY N/A 01/15/2019   Procedure: TOTAL THYROIDECTOMY;  Surgeon: Melida Quitter, MD;  Location: [facility] OR;  Service: ENT;  Laterality: N/A;    Current Outpatient Medications  Medication Sig Dispense Refill   acetaminophen (TYLENOL) 500 MG tablet Take 1,000 mg by mouth daily as needed for moderate pain.     alfuzosin (UROXATRAL) 10 MG 24 hr tablet Take 10 mg by mouth at bedtime.      buPROPion (WELLBUTRIN XL) 150 MG 24 hr tablet Take 1 tablet (150 mg total) by mouth daily. 90 tablet 3   Cholecalciferol (VITAMIN D) 2000 units tablet Take 2,000 Units by mouth at bedtime.      ezetimibe (ZETIA) 10 MG tablet Take 1 tablet (10 mg total) by mouth every morning. 90 tablet 2   finasteride (PROSCAR) 5 MG tablet Take 5 mg by mouth at bedtime.      levothyroxine (SYNTHROID) 175 MCG tablet Take 1 tablet (175 mcg total) by mouth daily.     lidocaine-prilocaine (EMLA) cream Apply to affected area once before sleep time- 30 g 3   metoprolol succinate (TOPROL-XL) 25 MG 24 hr tablet Take 12.5 mg by mouth every evening.     Polyethyl Glycol-Propyl Glycol (SYSTANE HYDRATION PF OP) Place 1 drop into both eyes 3 (three) times daily as needed (dry eyes).      No current facility-administered medications for this visit.    Allergies as of 05/22/2021 - Review Complete 05/22/2021  Allergen Reaction Noted   Aspirin  04/24/2018   Levaquin [levofloxacin in d5w]  07/22/2017   Nickel Itching 07/22/2017   Other  11/19/2018   Pravastatin   03/27/2021   Simvastatin  08/19/2019   Statins Other (See Comments) 03/30/2013   Allopurinol Rash 06/17/2018   Ampicillin Rash 11/16/2012   Penicillin g Rash 08/19/2019    Vitals: BP 120/62    Pulse 79    Ht 6\' 1"  (1.854 m)    Wt 220 lb (99.8 kg)    BMI 29.03 kg/m  Last Weight:  Wt Readings from Last 1 Encounters:  05/22/21 220 lb (99.8 kg)   NLG:XQJJ mass index is 29.03 kg/m.     Last Height:   Ht Readings from Last 1 Encounters:  05/22/21 6\' 1"  (1.854 m)    Physical exam:  General: The patient is awake, alert and appears not in acute distress. The patient is well groomed. Head: Normocephalic, atraumatic. Neck is supple. Mallampati 2  neck circumference:17. 5 . Nasal airflow patent , very hoarse voice.   Cardiovascular:  Regular rate and rhythm , without  murmurs or carotid bruit, and without distended  neck veins. Status post mitral valve repair.  Respiratory: Lungs are clear to auscultation. Skin:  Without evidence of edema, or rash Trunk: BMI is 29. last 8 pounds.   The patient's posture is erect   Neurologic exam : The patient is awake and alert, oriented to place and time.   Attention span & concentration ability appears normal.  Speech is fluent, with dysarthria, dysphonia .  Mood and affect are concerned.   Cranial nerves: No change in taste or smell.  Pupils are equal and reactive to light.Extraocular movements  in vertical and horizontal planes intact and without nystagmus. Visual fields by finger perimetry are intact. Hearing to finger rub intact.   Facial sensation intact to fine touch.  Facial motor strength is symmetric and tongue and uvula move midline. Shoulder shrug was symmetrical.   Motor exam:  Normal tone, muscle bulk and symmetric strength in all extremities. Sensory:  Vibration completely lost below knee , ascending skin changes, dystrophy, hair loss, shiny skin.  Occasionally uses EMLA cream, he has abnormal sensation all the time.  Coordination:  Rapid alternating movements in the fingers/hands was normal.  Finger-to-nose maneuver  normal without evidence of ataxia, dysmetria or tremor. Gait and station: Patient walks without assistive device and is able unassisted to climb up to the exam table. Strength within normal limits. Stance is stable and normal. He still turns with 3  Steps.  Deep tendon reflexes: in the  upper and lower extremities are symmetric and intact. Babinski maneuver response is  downgoing.   Assessment:   30 minutes - After physical and neurologic examination, review of laboratory studies,  Personal review of imaging studies, reports of other /same  Imaging studies, results of polysomnography and / or neurophysiology testing and pre-existing records as far as provided in visit., my assessment is   1)confirmed by NCV and EMG is his neuropathy with pain, Pin and needles, cramping and burning. Now clearly with associated skin dystrophy,skin is shiny and hairless- loss of DTR- all pulses palpable  can't feel vibration but temperature. RLS like - .this affects his sleep. 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   2)He has had atrial fibrillation, isolated - , does not drink, no caffeine, no chocolate- has a history of AAA, and of of' many lower retroperitoneal abdominal aneurysmata" . Had a bleed in may 2014. Was told not to get on anticoagulation.  One was coiled at [facility].  Worried about apnea in light of atrial fib. SOB, fatigue sleep apnea was ruled out.   3) concerned  about MCI.  Just borderline, short term memory , recall delayed, MOCA testing today was stable, 28/ 30.   Montreal Cognitive Assessment  05/22/2021 11/15/2020 04/30/2018  Visuospatial/ Executive (0/5) 5 4 5   Naming (0/3) 3 3 3   Attention: Read list of digits (0/2) 2 2 2   Attention: Read list of letters (0/1) 1 1 1   Attention: Serial 7 subtraction starting at 100 (0/3) 3 3 3   Language: Repeat phrase (0/2) 2 2 2    Language : Fluency (0/1) 1 0 1  Abstraction (0/2) 2 2 2   Delayed Recall (0/5) 3 3 0  Orientation (0/6) 6 6 6   Total 28 26 25     The patient was advised of the nature of the diagnosed disorder , the treatment options and the  risks for general health and wellness arising from not treating the condition.   I spent more than 35 minutes of face to face time  with the patient.  Greater than 50% of time was spent in counseling and coordination of care. We have discussed the diagnosis and differential and I answered the patient's questions.   Montreal Cognitive Assessment  05/22/2021 11/15/2020 04/30/2018  Visuospatial/ Executive (0/5) 5 4 5   Naming (0/3) 3 3 3   Attention: Read list of digits (0/2) 2 2 2   Attention: Read list of letters (0/1) 1 1 1   Attention: Serial 7 subtraction starting at 100 (0/3) 3 3 3   Language: Repeat phrase (0/2) 2 2 2   Language : Fluency (0/1) 1 0 1  Abstraction (0/2) 2 2 2   Delayed Recall (0/5) 3 3 0  Orientation (0/6) 6 6 6   Total 28 26 25       Plan:  Treatment plan and additional workup :  Atrial fib, single  isolated event- just after he contracted COVID,  self converted back to NSR- not interested in watchman and not in anticoagulation- loop recorder, followed by atrial fib clinic. Dr. Rayann Heman.  Wellbutrin in AM po.helps with neuropathy.  Lets add low dose Requip for RLS like creepy crawly sensation-  nocturnal pain,  Axonal Neuropathy- more stiffness. Its advancing- ascending. EMLA cream reordered.  [place] stays the same ,  MOCA needed until 2024.     Larey Seat, MD 9/83/3825, 05:39 AM  Certified in Neurology by ABPN Certified in [place] by [facility] [address], [address], [place] [address]

## 2021-05-24 ENCOUNTER — Telehealth: Payer: Self-pay | Admitting: Pharmacist

## 2021-05-24 NOTE — Telephone Encounter (Signed)
Patient wife (per DPR) called asking what patient can take for a dry cough with some post nasal drip. Advised that cough is most likely from post nasal drip. Recommended an antihistamine like clartin, zytec, allega or chlorpheniramine (can cause more drowsiness). Also recommended a saline spary or rinse and flonase.  ?

## 2021-05-25 ENCOUNTER — Telehealth: Payer: Self-pay | Admitting: Internal Medicine

## 2021-05-25 NOTE — Telephone Encounter (Signed)
Spoke with patient's wife informed her that Dr. Rayann Heman had reviewed the transmission and had no further recommendations, informed patient that the next billable download will occur on 06/04/21 and a note should be available in MyChart the next day patients wife voiced understanding.  ?

## 2021-05-25 NOTE — Telephone Encounter (Signed)
? ?  Pt's wife calling hs question about transmission done 05/02/21 ?

## 2021-05-26 ENCOUNTER — Telehealth: Payer: Self-pay | Admitting: Internal Medicine

## 2021-05-26 NOTE — Telephone Encounter (Signed)
Patient patient's wife called regarding patient having mildly elevated heart rate in the low 100s and having viral syndrome symptoms over the past several days.  Multiple negative COVID-19 test that were negative at home. ? ?Blood pressure has been stable in the low 100s over 70s consistent with his outpatient blood pressures. ? ?Recommend increasing hydration including taking some Gatorade or Powerade this evening and try to drink up at least 30 ounces of fluid in addition to taking Toprol-XL 12.5 additional dose tonight.  If symptoms do not improve he will go to the ER. ? ? ?Quinley Nesler A Jonny Longino ? ?

## 2021-05-27 ENCOUNTER — Other Ambulatory Visit: Payer: Self-pay

## 2021-05-27 ENCOUNTER — Emergency Department (HOSPITAL_BASED_OUTPATIENT_CLINIC_OR_DEPARTMENT_OTHER): Payer: Medicare Other | Admitting: Radiology

## 2021-05-27 ENCOUNTER — Encounter (HOSPITAL_BASED_OUTPATIENT_CLINIC_OR_DEPARTMENT_OTHER): Payer: Self-pay | Admitting: Emergency Medicine

## 2021-05-27 ENCOUNTER — Emergency Department (HOSPITAL_BASED_OUTPATIENT_CLINIC_OR_DEPARTMENT_OTHER)
Admission: EM | Admit: 2021-05-27 | Discharge: 2021-05-27 | Disposition: A | Discharge status: Home / Self Care | Payer: Medicare Other | Attending: Emergency Medicine | Admitting: Emergency Medicine

## 2021-05-27 DIAGNOSIS — I471 Supraventricular tachycardia: Secondary | ICD-10-CM | POA: Insufficient documentation

## 2021-05-27 DIAGNOSIS — Z8572 Personal history of non-Hodgkin lymphomas: Secondary | ICD-10-CM | POA: Insufficient documentation

## 2021-05-27 DIAGNOSIS — Z79899 Other long term (current) drug therapy: Secondary | ICD-10-CM | POA: Diagnosis not present

## 2021-05-27 DIAGNOSIS — Z85820 Personal history of malignant melanoma of skin: Secondary | ICD-10-CM | POA: Diagnosis not present

## 2021-05-27 DIAGNOSIS — Z85850 Personal history of malignant neoplasm of thyroid: Secondary | ICD-10-CM | POA: Insufficient documentation

## 2021-05-27 DIAGNOSIS — R000 Tachycardia, unspecified: Secondary | ICD-10-CM | POA: Diagnosis present

## 2021-05-27 DIAGNOSIS — I4891 Unspecified atrial fibrillation: Secondary | ICD-10-CM | POA: Diagnosis not present

## 2021-05-27 DIAGNOSIS — R0602 Shortness of breath: Secondary | ICD-10-CM | POA: Insufficient documentation

## 2021-05-27 DIAGNOSIS — Z20822 Contact with and (suspected) exposure to covid-19: Secondary | ICD-10-CM | POA: Diagnosis not present

## 2021-05-27 LAB — CBC WITH DIFFERENTIAL/PLATELET
Abs Immature Granulocytes: 0.01 10*3/uL (ref 0.00–0.07)
Basophils Absolute: 0 10*3/uL (ref 0.0–0.1)
Basophils Relative: 1 %
Eosinophils Absolute: 0.1 10*3/uL (ref 0.0–0.5)
Eosinophils Relative: 2 %
HCT: 40.6 % (ref 39.0–52.0)
Hemoglobin: 13.4 g/dL (ref 13.0–17.0)
Immature Granulocytes: 0 %
Lymphocytes Relative: 20 %
Lymphs Abs: 1.1 10*3/uL (ref 0.7–4.0)
MCH: 30 pg (ref 26.0–34.0)
MCHC: 33 g/dL (ref 30.0–36.0)
MCV: 90.8 fL (ref 80.0–100.0)
Monocytes Absolute: 0.8 10*3/uL (ref 0.1–1.0)
Monocytes Relative: 15 %
Neutro Abs: 3.3 10*3/uL (ref 1.7–7.7)
Neutrophils Relative %: 62 %
Platelets: 160 10*3/uL (ref 150–400)
RBC: 4.47 MIL/uL (ref 4.22–5.81)
RDW: 13 % (ref 11.5–15.5)
WBC: 5.3 10*3/uL (ref 4.0–10.5)
nRBC: 0 % (ref 0.0–0.2)

## 2021-05-27 LAB — URINALYSIS, ROUTINE W REFLEX MICROSCOPIC
Bilirubin Urine: NEGATIVE
Glucose, UA: NEGATIVE mg/dL
Hgb urine dipstick: NEGATIVE
Ketones, ur: NEGATIVE mg/dL
Leukocytes,Ua: NEGATIVE
Nitrite: NEGATIVE
Protein, ur: NEGATIVE mg/dL
Specific Gravity, Urine: 1.006 (ref 1.005–1.030)
pH: 5.5 (ref 5.0–8.0)

## 2021-05-27 LAB — BASIC METABOLIC PANEL
Anion gap: 10 (ref 5–15)
BUN: 23 mg/dL (ref 8–23)
CO2: 24 mmol/L (ref 22–32)
Calcium: 8.9 mg/dL (ref 8.9–10.3)
Chloride: 104 mmol/L (ref 98–111)
Creatinine, Ser: 1.29 mg/dL — ABNORMAL HIGH (ref 0.61–1.24)
GFR, Estimated: 56 mL/min — ABNORMAL LOW (ref >60)
Glucose, Bld: 91 mg/dL (ref 70–99)
Potassium: 3.9 mmol/L (ref 3.5–5.1)
Sodium: 138 mmol/L (ref 135–145)

## 2021-05-27 LAB — TROPONIN I (HIGH SENSITIVITY)
Troponin I (High Sensitivity): 8 ng/L (ref <18)
Troponin I (High Sensitivity): 7 ng/L (ref <18)

## 2021-05-27 LAB — BRAIN NATRIURETIC PEPTIDE: B Natriuretic Peptide: 159.2 pg/mL — ABNORMAL HIGH (ref 0.0–100.0)

## 2021-05-27 LAB — RESP PANEL BY RT-PCR (FLU A&B, COVID) ARPGX2
Influenza A by PCR: NEGATIVE
Influenza B by PCR: NEGATIVE
SARS Coronavirus 2 by RT PCR: NEGATIVE

## 2021-05-27 LAB — D-DIMER, QUANTITATIVE: D-Dimer, Quant: 0.41 ug/mL-FEU (ref 0.00–0.50)

## 2021-05-27 MED ORDER — ACETAMINOPHEN 325 MG PO TABS
650.0000 mg | ORAL_TABLET | Freq: Once | ORAL | Status: AC
  Administered 2021-05-27: 650 mg via ORAL

## 2021-05-27 MED ORDER — ACETAMINOPHEN 325 MG PO TABS
ORAL_TABLET | ORAL | Status: AC
  Filled 2021-05-27: qty 2

## 2021-05-27 MED ORDER — METOPROLOL TARTRATE 5 MG/5ML IV SOLN
5.0000 mg | Freq: Once | INTRAVENOUS | Status: AC
  Administered 2021-05-27: 5 mg via INTRAVENOUS
  Filled 2021-05-27: qty 5

## 2021-05-27 MED ORDER — METOPROLOL TARTRATE 5 MG/5ML IV SOLN
5.0000 mg | Freq: Once | INTRAVENOUS | Status: AC
Start: 2021-05-27 — End: 2021-05-27
  Administered 2021-05-27: 5 mg via INTRAVENOUS
  Filled 2021-05-27: qty 5

## 2021-05-27 NOTE — ED Provider Notes (Signed)
[facility] Provider Note: Sean Spurling, MD, FACEP  CSN: [id number] MRN: [id number] ARRIVAL: 05/27/21 at [place]: [id number]   CHIEF COMPLAINT  Tachycardia   HISTORY OF PRESENT ILLNESS  05/27/21 1:45 AM Sean Stanley is a 81 y.o. male with a history of multiple medical problems including mantle cell lymphoma and paroxysmal atrial fibrillation.  He is not on anticoagulation due to known aortic and other intra abdominal aneurysms.  He states his heart rate is usually in the 60s but throughout the day yesterday he noticed his heart rate to be elevated.  It has been in the 110s and his blood pressure has been lower than usual (SBP 100).  He had some heaviness in his chest earlier yesterday but that resolved.  He is having no chest discomfort at the present time.  He is not short of breath at rest but gets easily winded with exertion.  He is having no abdominal pain.  He denies pain or swelling in his legs.  He denies melena.  He has recently had cold symptoms (dry cough, postnasal drip) but had 2 negative COVID tests at home.  He is not currently taking any over-the-counter cold medications.   He called his A-fib clinic prior to arrival and was advised to take an additional 12.5 mg of metoprolol.  This did not resolve his symptoms.   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   s/p coiling 12/16/12 - [facility]   Atrial flutter ([facility])    BPH (benign prostatic hyperplasia)    Cancer ([place])    melanoma on lower right back and left chest - surgically removed and cleared   Dysrhythmia    H/O  agent Orange exposure    Headache    migraine- not current   High bilirubin    pt states it's genetic   History of blood transfusion    Hypercholesteremia    Hypercholesterolemia    Lymphoma ([place]) 04/2018   Mitral valve disease    annuloplasty 2002 [facility]   Neuropathy    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 Maxwell Caul told him he had improved   Pneumonia    Thoracic ascending aortic aneurysm    4.5 cm 03/2018 CT   Thyroid cancer [facility])     Past Surgical History:  Procedure Laterality Date   ABDOMINAL ANGIOGRAM  08/05/2012   aneurysm repair     CARDIOVERSION  02/12/2012   Procedure: CARDIOVERSION;  Surgeon: Pixie Casino, MD;  Location: [facility] ENDOSCOPY;  Service: Cardiovascular;  Laterality: N/A;   CARDIOVERSION N/A 06/22/2013   Procedure: CARDIOVERSION;  Surgeon: [name], MD;  Location: [place];  Service: Cardiovascular;  Laterality: N/A;   CATARACT EXTRACTION Bilateral 2018   with lens implant   CHOLECYSTECTOMY     COLONOSCOPY     ESOPHAGOGASTRODUODENOSCOPY     implantable loop recorder placement  03/29/2021   Medtronic Reveal Linq model LNQ 22 ([id number]) implantable loop recorder  IR IMAGING GUIDED PORT INSERTION  05/26/2018   IR REMOVAL TUN ACCESS W/ PORT W/O FL MOD SED  02/05/2021   LYMPH NODE BIOPSY Left 04/27/2018   Procedure: EXCISIONAL BIOPSY OF LEFT CERVICAL LYMPH NODE;  Surgeon: Melida Quitter, MD;  Location: [place];  Service: ENT;  Laterality: Left;   LYMPH NODE BIOPSY Left 11/23/2018   Procedure: LEFT CERVICAL LYMPH NODE OPEN BIOPSY;  Surgeon: Melida Quitter, MD;  Location: [place];  Service: ENT;  Laterality: Left;   MENISCUS REPAIR Right 2009   MITRAL VALVE REPAIR  2002   [facility]   NM MYOVIEW LTD  07/22/2006   no ischemia   RADICAL NECK DISSECTION Left 01/15/2019   Procedure: LEFT NECK DISSECTION;  Surgeon: Melida Quitter, MD;  Location: [place];  Service: ENT;   Laterality: Left;   RIGHT HEART CATH  06/19/2004   normal right heart dynamics. EF 50%   TEE WITHOUT CARDIOVERSION  02/12/2012   Procedure: TRANSESOPHAGEAL ECHOCARDIOGRAM (TEE);  Surgeon: Pixie Casino, MD;  Location[facility] ENDOSCOPY;  Service: Cardiovascular;  Laterality: N/A;   TEE WITHOUT CARDIOVERSION N/A 06/22/2013   Procedure: TRANSESOPHAGEAL ECHOCARDIOGRAM (TEE);  Surgeon: [name], MD;  Location: [place];  Service: Cardiovascular;  Laterality: N/A;   THYROIDECTOMY N/A 01/15/2019   Procedure: TOTAL THYROIDECTOMY;  Surgeon: Melida Quitter, MD;  Location: [facility] OR;  Service: ENT;  Laterality: N/A;    Family History  Problem Relation Age of Onset   Parkinson's disease Mother 35   Heart failure Father 49   Cancer Maternal Grandmother    Heart attack Maternal Grandfather    Heart attack Paternal Grandmother    Heart attack Paternal Grandfather     Social History   Tobacco Use   Smoking status: Never   Smokeless tobacco: Never  Vaping Use   Vaping Use: Never used  Substance Use Topics   Alcohol use: Not Currently    Comment: social   Drug use: No    Prior to Admission medications   Medication Sig Start Date End Date Taking? Authorizing Provider  acetaminophen (TYLENOL) 500 MG tablet Take 1,000 mg by mouth daily as needed for moderate pain.    [provider]  alfuzosin (UROXATRAL) 10 MG 24 hr tablet Take 10 mg by mouth at bedtime.     [provider]  buPROPion (WELLBUTRIN XL) 150 MG 24 hr tablet Take 1 tablet (150 mg total) by mouth daily. 05/22/21   Dohmeier, Asencion Partridge, MD  Cholecalciferol (VITAMIN D) 2000 units tablet Take 2,000 Units by mouth at bedtime.     [provider]  ezetimibe (ZETIA) 10 MG tablet Take 1 tablet (10 mg total) by mouth every morning. 03/07/21   Freada Bergeron, MD  finasteride (PROSCAR) 5 MG tablet Take 5 mg by mouth at bedtime.  09/23/14   [provider]  levothyroxine (SYNTHROID) 175 MCG tablet Take  1 tablet (175 mcg total) by mouth daily. 05/25/19   Heath Lark, MD  lidocaine-prilocaine (EMLA) cream Apply to affected area once before sleep time- 05/22/21   Dohmeier, Asencion Partridge, MD  metoprolol succinate (TOPROL-XL) 25 MG 24 hr tablet Take 12.5 mg by mouth every evening. 06/11/16   [provider]  Polyethyl Glycol-Propyl Glycol (SYSTANE HYDRATION PF OP) Place 1 drop into both eyes 3 (three) times daily as needed (dry eyes).     [provider]    Allergies Aspirin, Levaquin [levofloxacin in d5w], Nickel, Other, Pravastatin, Simvastatin, Statins, Allopurinol, Ampicillin, and Penicillin g   REVIEW  OF SYSTEMS  Negative except as noted here or in the History of Present Illness.   PHYSICAL EXAMINATION  Initial Vital Signs Blood pressure (!) 141/81, pulse (!) 112, temperature 97.9 F (36.6 C), temperature source Oral, resp. rate (!) 26, height '6\' 1"'$  (1.854 m), weight 97.5 kg, SpO2 95 %.  Examination General: Well-developed, well-nourished male in no acute distress; appearance consistent with age of record HENT: normocephalic; atraumatic Eyes: pupils equal, round and reactive to light; extraocular muscles intact; bilateral pseudophakia Neck: supple Heart: regular rate and rhythm; tachycardia Lungs: clear to auscultation bilaterally Abdomen: soft; nondistended; nontender; unable to palpate patient's known aortic aneurysm; bowel sounds present Extremities: No deformity; full range of motion; pulses normal Neurologic: Awake, alert and oriented; motor function intact in all extremities and symmetric; no facial droop Skin: Warm and dry Psychiatric: Anxious   RESULTS  Summary of this visit's results, reviewed and interpreted by myself:   EKG Interpretation  Date/Time:  Sunday May 27 2021 01:42:00 EST Ventricular Rate:  112 PR Interval:  170 QRS Duration: 107 QT Interval:  356 QTC Calculation: 486 R Axis:   10 Text Interpretation: Ectopic atrial tachycardia, unifocal  RSR' in V1 or V2, right VCD or RVH Repol abnrm, severe global ischemia (LM/MVD) Previously normal Confirmed by Shanon Rosser [phone]) on 05/27/2021 1:44:23 AM       Laboratory Studies: Results for orders placed or performed during the hospital encounter of 05/27/21 (from the past 24 hour(s))  CBC with Differential/Platelet     Status: None   Collection Time: 05/27/21  1:50 AM  Result Value Ref Range   WBC 5.3 4.0 - 10.5 K/uL   RBC 4.47 4.22 - 5.81 MIL/uL   Hemoglobin 13.4 13.0 - 17.0 g/dL   HCT 40.6 39.0 - 52.0 %   MCV 90.8 80.0 - 100.0 fL   MCH 30.0 26.0 - 34.0 pg   MCHC 33.0 30.0 - 36.0 g/dL   RDW 13.0 11.5 - 15.5 %   Platelets 160 150 - 400 K/uL   nRBC 0.0 0.0 - 0.2 %   Neutrophils Relative % 62 %   Neutro Abs 3.3 1.7 - 7.7 K/uL   Lymphocytes Relative 20 %   Lymphs Abs 1.1 0.7 - 4.0 K/uL   Monocytes Relative 15 %   Monocytes Absolute 0.8 0.1 - 1.0 K/uL   Eosinophils Relative 2 %   Eosinophils Absolute 0.1 0.0 - 0.5 K/uL   Basophils Relative 1 %   Basophils Absolute 0.0 0.0 - 0.1 K/uL   Immature Granulocytes 0 %   Abs Immature Granulocytes 0.01 0.00 - 0.07 K/uL  Troponin I (High Sensitivity)     Status: None   Collection Time: 05/27/21  1:50 AM  Result Value Ref Range   Troponin I (High Sensitivity) 8 <18 ng/L  Brain natriuretic peptide     Status: Abnormal   Collection Time: 05/27/21  1:50 AM  Result Value Ref Range   B Natriuretic Peptide 159.2 (H) 0.0 - 100.0 pg/mL  Basic metabolic panel     Status: Abnormal   Collection Time: 05/27/21  1:50 AM  Result Value Ref Range   Sodium 138 135 - 145 mmol/L   Potassium 3.9 3.5 - 5.1 mmol/L   Chloride 104 98 - 111 mmol/L   CO2 24 22 - 32 mmol/L   Glucose, Bld 91 70 - 99 mg/dL   BUN 23 8 - 23 mg/dL   Creatinine, Ser 1.29 (H) 0.61 - 1.24 mg/dL   Calcium 8.9 8.9 -  10.3 mg/dL   GFR, Estimated 56 (L) >60 mL/min   Anion gap 10 5 - 15  D-dimer, quantitative     Status: None   Collection Time: 05/27/21  1:50 AM  Result Value Ref  Range   D-Dimer, Quant 0.41 0.00 - 0.50 ug/mL-FEU  Resp Panel by RT-PCR (Flu A&B, Covid) Nasopharyngeal Swab     Status: None   Collection Time: 05/27/21  1:52 AM   Specimen: Nasopharyngeal Swab; Nasopharyngeal(NP) swabs in vial transport medium  Result Value Ref Range   SARS Coronavirus 2 by RT PCR NEGATIVE NEGATIVE   Influenza A by PCR NEGATIVE NEGATIVE   Influenza B by PCR NEGATIVE NEGATIVE  Urinalysis, Routine w reflex microscopic Urine, Clean Catch     Status: Abnormal   Collection Time: 05/27/21  2:08 AM  Result Value Ref Range   Color, Urine COLORLESS (A) YELLOW   APPearance CLEAR CLEAR   Specific Gravity, Urine 1.006 1.005 - 1.030   pH 5.5 5.0 - 8.0   Glucose, UA NEGATIVE NEGATIVE mg/dL   Hgb urine dipstick NEGATIVE NEGATIVE   Bilirubin Urine NEGATIVE NEGATIVE   Ketones, ur NEGATIVE NEGATIVE mg/dL   Protein, ur NEGATIVE NEGATIVE mg/dL   Nitrite NEGATIVE NEGATIVE   Leukocytes,Ua NEGATIVE NEGATIVE  Troponin I (High Sensitivity)     Status: None   Collection Time: 05/27/21  4:03 AM  Result Value Ref Range   Troponin I (High Sensitivity) 7 <18 ng/L   Imaging Studies: DG Chest 2 View  Result Date: 05/27/2021 CLINICAL DATA:  Shortness of breath EXAM: CHEST - 2 VIEW COMPARISON:  02/28/2021 FINDINGS: Cardiac shadow is stable. Loop recorder is noted. Postsurgical changes in the right anterior ribcage are seen and stable. No focal infiltrate or sizable effusion is noted. Degenerative changes of the thoracic spine are seen. Embolization changes are noted in the left upper quadrant. IMPRESSION: No acute abnormality noted. Electronically Signed   By: Inez Catalina M.D.   On: 05/27/2021 02:34    ED COURSE and MDM  Nursing notes, initial and subsequent vitals signs, including pulse oximetry, reviewed and interpreted by myself.  Vitals:   05/27/21 0400 05/27/21 0415 05/27/21 0430 05/27/21 0445  BP: 113/72 113/72 111/76   Pulse: (!) 110 (!) 110 (!) 109 (!) 109  Resp: '20 15 18 15  '$ Temp:       TempSrc:      SpO2: 95% 96% 94% 96%  Weight:      Height:       Medications  metoprolol tartrate (LOPRESSOR) injection 5 mg (5 mg Intravenous Given 05/27/21 0303)  metoprolol tartrate (LOPRESSOR) injection 5 mg (5 mg Intravenous Given 05/27/21 0357)  acetaminophen (TYLENOL) tablet 650 mg (650 mg Oral Given 05/27/21 0420)    1:59 AM The patient's EKG is abnormal with diffuse ST depressions and an apparent acute ectopic atrial tachycardia (narrow complex).  He is having no chest pain but is dyspneic.  He is having no pain to suggest aortic dissection or rupture.  He denies melanotic stools or hematochezia.  Although his blood pressure may have been low at home it is within normal limits currently in the ED.  2:47 AM Troponin and D-dimer within normal limits.  H&H are within normal limits so I doubt any acute bleeding.   2:58 AM Discussed with Dr. Shirlee Latch, on-call cardiologist.  He advises we attempt rate control and the patient with some IV Lopressor.  4:38 AM Heart rate still unchanged despite a total of 10 mg of Lopressor  IV.  I do not wish to give additional Lopressor as this might cause further hypotension.  The patient was reassured that this is not a lethal arrhythmia but may need treatment with an antiarrhythmic.  I will contact his cardiologist through [place] and he should contact the office tomorrow (Monday) morning regarding follow-up.  PROCEDURES  Procedures   ED DIAGNOSES     ICD-10-CM   1. Ectopic atrial tachycardia ([place])  I47.1          Jurnie Garritano, MD 05/27/21 [phone]

## 2021-05-27 NOTE — ED Triage Notes (Signed)
?  Patient comes in with tachycardia and hyptension that started last night.  Patient states he has been battling a cold all week and is on medications for atrial fibrillation. Around 2130 he felt his HR start racing and called the on call number for his cardiologist.  Patient was advised to take additional 12.5 mg of metoprolol and come in to be evaluated if it did not help.  No pain at this time.  Patient does have shortness of breath and is easily winded.   ?

## 2021-05-28 ENCOUNTER — Telehealth: Payer: Self-pay | Admitting: *Deleted

## 2021-05-28 ENCOUNTER — Telehealth: Payer: Self-pay | Admitting: Internal Medicine

## 2021-05-28 ENCOUNTER — Ambulatory Visit (HOSPITAL_COMMUNITY)
Admission: RE | Admit: 2021-05-28 | Discharge: 2021-05-28 | Disposition: A | Discharge status: Home / Self Care | Payer: Medicare Other | Source: Ambulatory Visit | Attending: Physician Assistant | Admitting: Physician Assistant

## 2021-05-28 VITALS — BP 128/84 | HR 115 | Ht 73.0 in | Wt 219.6 lb

## 2021-05-28 DIAGNOSIS — R5383 Other fatigue: Secondary | ICD-10-CM | POA: Diagnosis not present

## 2021-05-28 DIAGNOSIS — I4892 Unspecified atrial flutter: Secondary | ICD-10-CM | POA: Diagnosis not present

## 2021-05-28 DIAGNOSIS — D6869 Other thrombophilia: Secondary | ICD-10-CM | POA: Diagnosis not present

## 2021-05-28 DIAGNOSIS — R0602 Shortness of breath: Secondary | ICD-10-CM | POA: Insufficient documentation

## 2021-05-28 DIAGNOSIS — I480 Paroxysmal atrial fibrillation: Secondary | ICD-10-CM | POA: Diagnosis not present

## 2021-05-28 DIAGNOSIS — I4819 Other persistent atrial fibrillation: Secondary | ICD-10-CM | POA: Insufficient documentation

## 2021-05-28 MED ORDER — APIXABAN 5 MG PO TABS: 5.0000 mg | ORAL_TABLET | Freq: Two times a day (BID) | ORAL | 3 refills | Status: AC

## 2021-05-28 NOTE — Telephone Encounter (Signed)
Freada Bergeron, MD   ?Darcella Cheshire,  ? ?Can we check in with him to see how he is doing and if he needs an appointment?  ? ? ?Thank you!!  ?-Heather  ? ? ? ? ?See additional triage note on this pt from today.  Pt and wife called in and spoke with triage nurse earlier this morning.  Pt will see Adline Peals PA-C in afib clinic today, for post-ER follow-up and for complaints. See triage nurse note from today for further details. He will have follow-up as planned with Dr. Johney Frame, on 06/14/21. ?

## 2021-05-28 NOTE — Telephone Encounter (Signed)
Patient c/o Palpitations:  High priority if patient c/o lightheadedness, shortness of breath, or chest pain ? ?How long have you had palpitations/irregular HR/ Afib? Are you having the symptoms now?  ?Continued atrial tachycardia since 3/04. Went to ED, advised to follow up with cardiology.  ?Still having symptoms this morning - SOB, lightheadedness ? ?Are you currently experiencing lightheadedness, SOB or CP?  ?SOB, lightheadedness ? ?Do you have a history of afib (atrial fibrillation) or irregular heart rhythm?  ?Yes  ? ?Have you checked your BP or HR? (document readings if available):  ? ?3/05: BP was low all day, systolic got down to 16'L and HR ranged 110-115 ?3/06: 118/83 115 ? ?Are you experiencing any other symptoms?  ?No  ? ?

## 2021-05-28 NOTE — Telephone Encounter (Signed)
Requested review of loop transmission by gen cards triage to follow up on ED visit 05/27/21. 9 AT/AF episodes noted. Longest 04/26/21 for 5 hours, all other episodes occurred 3/4 and 3/5. No OAC secondary to aortic aneurism. Routing back to triage.  ? ? ? ? ? ? ? ?

## 2021-05-28 NOTE — Telephone Encounter (Signed)
Per Adline Peals PA will bring in for assessment before further medication adjustments since patient recently took morning medications. Pt wife in agreement. ?

## 2021-05-28 NOTE — Patient Instructions (Signed)
Start Eliquis '5mg'$  twice a day ? ?Cardioversion scheduled for Tuesday, March 28th ? -come to clinic for labs at [place] at the [employer] and go to admitting at 1030am ? - Do not eat or drink anything after midnight the night prior to your procedure. ? - Take all your morning medication (except diabetic medications) with a sip of water prior to arrival. ? - You will not be able to drive home after your procedure. ? - Do NOT miss any doses of your blood thinner - if you should miss a dose please notify our office immediately. ? - If you feel as if you go back into normal rhythm prior to scheduled cardioversion, please notify our office immediately. If your procedure is canceled in the cardioversion suite you will be charged a cancellation fee. ? ?

## 2021-05-28 NOTE — Progress Notes (Signed)
Primary Care Physician: Kathalene Frames, MD Referring Physician: ER f/u  Prior Cardiologist: Dr. Edwin Dada ([facility]), recently transferred over to Dr. Joslyn Devon is a 81 y.o. male with a h/o  MV repair 2002,  multi abdominal aneurysms with prior bleeding,(followed at [facility]),  unable to take anticoagulation, h/o atrial flutter, that is in the afib clinic, for onset of palpitations 8/9. He was found to be in rapid atrial fib at 143 bpm. He could not be cardioverted as he is not on anticoagulation 2/2 to multiple abdominal  aneurysms. He converted as IV was being started.  He was watched for another 3 hours and then d/c home with f/u here.  He is on metoprolol daily.   He states h/o ablation in 2015 at [facility] and had Covid the end of July. This is the first arrhythmia he has dad since ablation. He is in SR today.   Follow up in the AF clinic 05/28/21. Patient called [facility] with elevated heart rates and was instructed to take an additional dose of metoprolol and present to the ED if his symptoms did not improved. Patient was seen at the ED 05/27/21 with atypical atrial flutter. Rate control with IV metoprolol was unsuccessful and he was discharged without any other intervention. He has symptoms of fatigue, SOB, and poor sleep.   Today, he denies symptoms of chest pain, orthopnea, PND, lower extremity edema, dizziness, presyncope, syncope, or neurologic sequela. The patient is tolerating medications without difficulties and is otherwise without complaint today.   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   s/p coiling 12/16/12 - [facility]   Atrial flutter ([facility])    BPH (benign prostatic hyperplasia)    Cancer ([place])    melanoma on lower right back and left chest - surgically removed and cleared   Dysrhythmia    H/O agent Orange exposure    Headache    migraine- not current   High bilirubin    pt states it's genetic   History of blood transfusion    Hypercholesteremia    Hypercholesterolemia    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 Maxwell Caul told him he had improved   Pneumonia    Thoracic ascending aortic aneurysm    4.5 cm 03/2018 CT   Thyroid cancer [facility]    Past Surgical History:  Procedure Laterality Date   ABDOMINAL ANGIOGRAM  08/05/2012   aneurysm repair     CARDIOVERSION  02/12/2012   Procedure: CARDIOVERSION;  Surgeon: Pixie Casino, MD;  Location: [facility] ENDOSCOPY;  Service: Cardiovascular;  Laterality: N/A;   CARDIOVERSION N/A 06/22/2013   Procedure: CARDIOVERSION;  Surgeon: [name], MD;  Location: [place];  Service: Cardiovascular;  Laterality: N/A;   CATARACT EXTRACTION Bilateral 2018   with lens implant   CHOLECYSTECTOMY  COLONOSCOPY     ESOPHAGOGASTRODUODENOSCOPY     implantable loop recorder placement  03/29/2021   Medtronic Reveal Linq model LNQ [id number]) implantable loop recorder   IR IMAGING GUIDED PORT INSERTION  05/26/2018   IR REMOVAL TUN ACCESS W/ PORT W/O FL MOD SED  02/05/2021   LYMPH NODE BIOPSY Left 04/27/2018   Procedure: EXCISIONAL BIOPSY OF LEFT CERVICAL LYMPH NODE;  Surgeon: Melida Quitter, MD;  Location: [place];  Service: ENT;  Laterality: Left;   LYMPH NODE BIOPSY Left 11/23/2018   Procedure: LEFT CERVICAL LYMPH NODE OPEN BIOPSY;  Surgeon: Melida Quitter, MD;  Location: [place];  Service: ENT;  Laterality: Left;   MENISCUS REPAIR Right  2009   MITRAL VALVE REPAIR  2002   [facility]   NM MYOVIEW LTD  07/22/2006   no ischemia   RADICAL NECK DISSECTION Left 01/15/2019   Procedure: LEFT NECK DISSECTION;  Surgeon: Melida Quitter, MD;  Location: [place];  Service: ENT;  Laterality: Left;   RIGHT HEART CATH  06/19/2004   normal right heart dynamics. EF 50%   TEE WITHOUT CARDIOVERSION  02/12/2012   Procedure: TRANSESOPHAGEAL ECHOCARDIOGRAM (TEE);  Surgeon: Pixie Casino, MD;  Location: [facility] ENDOSCOPY;  Service: Cardiovascular;  Laterality: N/A;   TEE WITHOUT CARDIOVERSION N/A 06/22/2013   Procedure: TRANSESOPHAGEAL ECHOCARDIOGRAM (TEE);  Surgeon: [name], MD;  Location: [place];  Service: Cardiovascular;  Laterality: N/A;   THYROIDECTOMY N/A 01/15/2019   Procedure: TOTAL THYROIDECTOMY;  Surgeon: Melida Quitter, MD;  Location: [facility] OR;  Service: ENT;  Laterality: N/A;    Current Outpatient Medications  Medication Sig Dispense Refill   acetaminophen (TYLENOL) 500 MG tablet Take 1,000 mg by mouth daily as needed for moderate pain.     alfuzosin (UROXATRAL) 10 MG 24 hr tablet Take 10 mg by mouth at bedtime.      apixaban (ELIQUIS) 5 MG TABS tablet Take 1 tablet (5 mg total) by mouth 2 (two) times daily. 60 tablet 3   buPROPion (WELLBUTRIN XL) 150 MG 24 hr tablet Take 1 tablet (150 mg total) by mouth daily. 90 tablet 3   Cholecalciferol (VITAMIN D) 2000 units tablet Take 2,000 Units by mouth at bedtime.      ezetimibe (ZETIA) 10 MG tablet Take 1 tablet (10 mg total) by mouth every morning. 90 tablet 2   finasteride (PROSCAR) 5 MG tablet Take 5 mg by mouth at bedtime.      levothyroxine (SYNTHROID) 175 MCG tablet Take 1 tablet (175 mcg total) by mouth daily.     lidocaine-prilocaine (EMLA) cream Apply to affected area once before sleep time- 30 g 3   metoprolol succinate (TOPROL-XL) 25 MG 24 hr tablet Take 12.5 mg by mouth daily.     Polyethyl Glycol-Propyl Glycol (SYSTANE HYDRATION PF OP) Place 1 drop into both eyes 3 (three)  times daily as needed (dry eyes).      No current facility-administered medications for this encounter.    Allergies  Allergen Reactions   Aspirin     Has been instructed not to take any blood thinners even aspirin due to history of several aneurysms    Levaquin [Levofloxacin In D5w]     tendonitis   Nickel Itching   Other     No blood thinners due to history of several aneurysms    Pravastatin     Muscle aches   Simvastatin    Statins Other (See Comments)    Muscle aches   Allopurinol Rash  Ampicillin Rash    Did it involve swelling of the face/tongue/throat, SOB, or low BP? No Did it involve sudden or severe rash/hives, skin peeling, or any reaction on the inside of your mouth or nose? Yes Did you need to seek medical attention at a hospital or doctor's office? Yes When did it last happen?      50+ years If all above answers are NO, may proceed with cephalosporin use.    Penicillin G Rash    Social History   Socioeconomic History   Marital status: Married    Spouse name: [name]   Number of children: 2   Years of education: Not on file   Highest education level: Not on file  Occupational History   Occupation: retired Hydrologist  Tobacco Use   Smoking status: Never   Smokeless tobacco: Never  Vaping Use   Vaping Use: Never used  Substance and Sexual Activity   Alcohol use: Not Currently    Comment: social   Drug use: No   Sexual activity: Not on file  Other Topics Concern   Not on file  Social History Narrative   Lives in [place], Retired   Science writer Determinants of Health   Financial Resource Strain: Not on file  Food Insecurity: Not on file  Transportation Needs: Not on file  Physical Activity: Not on file  Stress: Not on file  Social Connections: Not on file  Intimate Partner Violence: Not on file    Family History  Problem Relation Age of Onset   Parkinson's disease Mother 87   Heart failure Father 64   Cancer Maternal Grandmother    Heart attack  Maternal Grandfather    Heart attack Paternal Grandmother    Heart attack Paternal Grandfather     ROS- All systems are reviewed and negative except as per the HPI above  Physical Exam: Vitals:   05/28/21 1358  BP: 128/84  Pulse: (!) 115  Weight: 99.6 kg  Height: '6\' 1"'$  (1.854 m)   Wt Readings from Last 3 Encounters:  05/28/21 99.6 kg  05/27/21 97.5 kg  05/22/21 99.8 kg    Labs: Lab Results  Component Value Date   NA 138 05/27/2021   K 3.9 05/27/2021   CL 104 05/27/2021   CO2 24 05/27/2021   GLUCOSE 91 05/27/2021   BUN 23 05/27/2021   CREATININE 1.29 (H) 05/27/2021   CALCIUM 8.9 05/27/2021   PHOS 3.4 08/11/2012   MG 2.2 08/11/2012   Lab Results  Component Value Date   INR 1.0 05/26/2018   Lab Results  Component Value Date   CHOL 210 (H) 02/13/2021   HDL 65 02/13/2021   LDLCALC 127 (H) 02/13/2021   TRIG 100 02/13/2021    GEN- The patient is a well appearing elderly male, alert and oriented x 3 today.   HEENT-head normocephalic, atraumatic, sclera clear, conjunctiva pink, hearing intact, trachea midline. Lungs- Clear to ausculation bilaterally, normal work of breathing Heart- Regular rate and rhythm, tachycardia, no murmurs, rubs or gallops  GI- soft, NT, ND, + BS Extremities- no clubbing, cyanosis, or edema MS- no significant deformity or atrophy Skin- no rash or lesion Psych- euthymic mood, full affect Neuro- strength and sensation are intact   EKG- atrial flutter with 2:1 block Vent. rate 115 BPM PR interval * ms QRS duration 104 ms QT/QTcB 356/492 ms  Epic records reviewed   Echo- 1. Left ventricular ejection fraction, by estimation, is 50 to 55%. Left ventricular ejection fraction by 3D volume  is 48 %. The left ventricle has mildly decreased function. The left ventricle demonstrates global hypokinesis. Left ventricular diastolic   parameters are consistent with Grade I diastolic dysfunction (impaired relaxation). Elevated left ventricular  end-diastolic pressure. The average left ventricular global longitudinal strain is 18.8 %. The global longitudinal strain is normal.   2. Right ventricular systolic function is low normal. The right  ventricular size is normal. There is normal pulmonary artery systolic pressure.   3. Left atrial size was mild to moderately dilated.   4. Right atrial size was mildly dilated.   5. The mitral valve has been repaired/replaced. Trivial mitral valve regurgitation. The mean mitral valve gradient is 1.0 mmHg with average heart rate of 58 bpm. There is a prosthetic annuloplasty ring present in the mitral position. Procedure Date:  2002.   6. The aortic valve is tricuspid. Aortic valve regurgitation is not  visualized. Mild aortic valve sclerosis is present, with no evidence of aortic valve stenosis.   7. Aortic dilatation noted. There is mild dilatation of the aortic root, measuring 39 mm. There is mild dilatation of the ascending aorta, measuring 41 mm.    Assessment and Plan:  1. Paroxysmal afib/atrial flutter Patient in symptomatic atrial flutter today. We discussed rhythm control options today. Will start Eliquis 5 mg BID and plan for DCCV after 3 weeks of anticoagulation. He will need to continue this for at least 4 weeks post DCCV. By guidelines, he would remain on anticoagulation indefinitely. He has had multiple discussions about [place], patient is hesitant to undergo the procedure. Will have him f/u with Dr Rayann Heman post DCCV to see if he needs a repeat ablation for long term afib/flutter management.  Continue metoprolol 12.5 mg daily  2. CHA2DS2VASc score of at least 3 (age, CHF) He is not presently on anticoagulation due to anyeursmal disease and prior rupture. Reviewed notes from Dr Rayann Heman (EP), Dr Janeece Riggers (cardiology), Kathlene Cote PA (vascular), Dr Brett Fairy (neurology) regarding anticoagulation. Start anticoagulation as above.    Follow up with Dr Rayann Heman post DCCV.    [facility] [address] [place], [place] [address] [phone]

## 2021-05-28 NOTE — Telephone Encounter (Signed)
Pt seen in ED yesterday for breakthrough tachycardia with associated hypotension, SOB, and fatigue. ED notes state that pt was in A-fib, EKG revealed ST depression with narrow complex tachycardia. Attempted rate control with 10 mg total IV metoprolol tartrate, no improvement in HR. Discharge instructions are to f/u with cardiology in AM due to rhythm not being life-threatening. Wife called clinic this AM for advisement. States pt is currently SOB and "exhausted" with BP 118/83 and HR 115-Pt did take home dose of Metoprolol Succinate already. Offered first available DOD appt with Dr Tamala Julian at 4:30 today or tomorrow with [facility], but advised wife that pt should be seen earlier than later. Will route to A-fib clinic now as an urgent request for advisement on medication. Device clinic consulted for his ILR: ? ?Requested review of loop transmission by gen cards triage to follow up on ED visit 05/27/21. 9 AT/AF episodes noted. Longest 04/26/21 for 5 hours, all other episodes occurred 3/4 and 3/5. No OAC secondary to aortic aneurism. Routing back to triage.  ?  ? ?

## 2021-05-30 DIAGNOSIS — R351 Nocturia: Secondary | ICD-10-CM | POA: Diagnosis not present

## 2021-05-30 DIAGNOSIS — N401 Enlarged prostate with lower urinary tract symptoms: Secondary | ICD-10-CM | POA: Diagnosis not present

## 2021-05-30 DIAGNOSIS — R350 Frequency of micturition: Secondary | ICD-10-CM | POA: Diagnosis not present

## 2021-05-31 DIAGNOSIS — R051 Acute cough: Secondary | ICD-10-CM | POA: Diagnosis not present

## 2021-05-31 DIAGNOSIS — J189 Pneumonia, unspecified organism: Secondary | ICD-10-CM | POA: Diagnosis not present

## 2021-05-31 DIAGNOSIS — R0981 Nasal congestion: Secondary | ICD-10-CM | POA: Diagnosis not present

## 2021-05-31 DIAGNOSIS — I4891 Unspecified atrial fibrillation: Secondary | ICD-10-CM | POA: Diagnosis not present

## 2021-06-04 ENCOUNTER — Ambulatory Visit (INDEPENDENT_AMBULATORY_CARE_PROVIDER_SITE_OTHER): Payer: Medicare Other

## 2021-06-04 ENCOUNTER — Telehealth: Payer: Self-pay

## 2021-06-04 DIAGNOSIS — I480 Paroxysmal atrial fibrillation: Secondary | ICD-10-CM | POA: Diagnosis not present

## 2021-06-04 NOTE — Telephone Encounter (Signed)
Per Adline Peals PA increase metoprolol to 12.'5mg'$  BID and will call with update on Wednesday. Pt in agreement. ?

## 2021-06-04 NOTE — Telephone Encounter (Addendum)
Alert from [employer]:  ? ?ILR alert report received. Battery status OK. Normal device function. No new symptom, tachy, brady, or pause episodes. Two new atrial flutter arrhythmias detected. On [place] according to previous reports.  Plans for DCCV on 06/19/2021.  There is not good ventricular rate control, presenting rhythm is atrial flutter at 120 bpm,  ? ?Presenting 06/02/21 ? ? ? ?Presenting 06/04/21  ? ? ? ? ? ? ? ?

## 2021-06-05 LAB — CUP PACEART REMOTE DEVICE CHECK
Date Time Interrogation Session: 20230313185643
Implantable Pulse Generator Implant Date: 20230105

## 2021-06-06 NOTE — Telephone Encounter (Signed)
Heart rates continue to be in the 110s. Discussed with Adline Peals PA will increase metoprolol to '25mg'$  BID. Call with update in a few days. ?

## 2021-06-11 ENCOUNTER — Encounter (HOSPITAL_COMMUNITY): Payer: Self-pay | Admitting: Internal Medicine

## 2021-06-11 DIAGNOSIS — C73 Malignant neoplasm of thyroid gland: Secondary | ICD-10-CM | POA: Diagnosis not present

## 2021-06-11 DIAGNOSIS — E890 Postprocedural hypothyroidism: Secondary | ICD-10-CM | POA: Diagnosis not present

## 2021-06-11 NOTE — Telephone Encounter (Signed)
Pt called with update - he decided not to increase metoprolol as he does not like taking medication. HRs have been 109-112 on 1/2 tablet twice a day. ?

## 2021-06-11 NOTE — Progress Notes (Deleted)
?Cardiology Office Note:   ? ?Date:  06/11/2021  ? ?ID:  Sean Stanley, DOB [birth year], MRN [id number] ? ?PCP:  Kathalene Frames, MD ?  ?[facility] [facility] Providers ?Cardiologist:  Lolita Patella, MD { ? ? ?Referring MD: [name], MD  ? ? ? ?History of Present Illness:   ? ?Sean Stanley is a 81 y.o. male with a very complicated cardiac and oncology history who was previously followed by Dr. Meda Coffee who now presents to clinic for follow-up. ? ?The patient is a [employer] veteran, he has been followed by Dr. Edwin Dada, cardiology is at [facility].  The patient underwent minimally invasive mitral valve repair in October 2002 at [facility] and has been followed there since then.  He was briefly seen at [facility] for episode of atrial flutter followed by cardioversion in 2014.  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 In May 2015 he underwent atrial flutter ablation.  ? ?He has also been followed by Dr. 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 He is currently in remission for both of his cancers. He has residual vocal cord dysfunction. ? ?During last visit with Dr. Meda Coffee on 03/27/20, the patient was stable from CV standpoint. He underwent TTE at [facility] 10/2019 that showed mild left ventricle dysfunction with LVEF of 45 to 50%, global longitudinal strain -12.8%, moderate concentric left ventricular hypertrophy, normal right ventricular systolic function, status post mitral valve repair with prosthetic mitral valve ring in place, with mild residual mitral regurgitation, moderately dilated left atrium, trivial tricuspid regurgitation, normal size of the right atrium and normal right-sided pressures. ? ?Last  seen in clinic 03/2021 by Dr. Rayann Heman. He requested loop at that time.  ? ?Presented to EF in 05/2021 with rapid HR found to be in atypical Aflutter that did not respond to IV metop. Seen in Afib clinic on 05/28/21 and was resumed on apixaban and recommended for DCCV. ? ?Today, *** ? ?Past Medical History:  ?Diagnosis Date  ? Abdominal aortic aneurysm, ruptured 2014  ? 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 followed at [facility]  ? 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 being [facility] at [facility]  ? Aneurysm of splenic artery ([place]) 2014  ? s/p coiling 12/16/12 - [facility]  ? Atrial flutter ([place])   ? BPH (benign prostatic hyperplasia)   ? Cancer [facility])   ? melanoma on lower right back and left chest - surgically removed and cleared  ? Dysrhythmia   ? H/O agent Orange exposure   ? Headache   ? migraine- not current  ? High bilirubin   ? 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 History of blood transfusion   ? Hypercholesteremia   ? Hypercholesterolemia   ? Lymphoma ([place]) 04/2018  ? Mitral valve disease   ? annuloplasty 2002 [facility]  ? Neuropathy   ? Neuropathy of both feet   ? 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 OSA (obstructive sleep apnea)   ? does not use cpap, Dr. Maxwell Caul told him  he had improved  ? Pneumonia   ? Thoracic ascending aortic aneurysm   ? 4.5 cm 03/2018 CT  ? Thyroid cancer ([place])   ? ? ?Past Surgical History:  ?Procedure Laterality Date  ? ABDOMINAL ANGIOGRAM  08/05/2012  ? aneurysm repair    ? CARDIOVERSION  02/12/2012  ? Procedure: CARDIOVERSION;  Surgeon: Pixie Casino, MD;  Location: [place];  Service: Cardiovascular;  Laterality: N/A;  ? CARDIOVERSION N/A 06/22/2013  ? Procedure: CARDIOVERSION;  Surgeon: [name], MD;  Location: [place];  Service: Cardiovascular;  Laterality: N/A;  ? CATARACT  EXTRACTION Bilateral 2018  ? with lens implant  ? CHOLECYSTECTOMY    ? COLONOSCOPY    ? ESOPHAGOGASTRODUODENOSCOPY    ? implantable loop recorder placement  03/29/2021  ? Medtronic Reveal Linq model [place] [id number]) implantable loop recorder  ? IR IMAGING GUIDED PORT INSERTION  05/26/2018  ? IR REMOVAL TUN ACCESS W/ PORT W/O FL MOD SED  02/05/2021  ? LYMPH NODE BIOPSY Left 04/27/2018  ? Procedure: EXCISIONAL BIOPSY OF LEFT CERVICAL LYMPH NODE;  Surgeon: Melida Quitter, MD;  Location: [place];  Service: ENT;  Laterality: Left;  ? LYMPH NODE BIOPSY Left 11/23/2018  ? Procedure: LEFT CERVICAL LYMPH NODE OPEN BIOPSY;  Surgeon: Melida Quitter, MD;  Location: [place];  Service: ENT;  Laterality: Left;  ? MENISCUS REPAIR Right 2009  ? MITRAL VALVE REPAIR  2002  ? [facility]  ? NM MYOVIEW LTD  07/22/2006  ? no ischemia  ? RADICAL NECK DISSECTION Left 01/15/2019  ? Procedure: LEFT NECK DISSECTION;  Surgeon: Melida Quitter, MD;  Location: [place];  Service: ENT;  Laterality: Left;  ? RIGHT HEART CATH  06/19/2004  ? normal right heart dynamics. EF 50%  ? TEE WITHOUT CARDIOVERSION  02/12/2012  ? Procedure: TRANSESOPHAGEAL ECHOCARDIOGRAM (TEE);  Surgeon: Pixie Casino, MD;  Location: [place];  Service: Cardiovascular;  Laterality: N/A;  ? TEE WITHOUT CARDIOVERSION N/A 06/22/2013  ? Procedure: TRANSESOPHAGEAL ECHOCARDIOGRAM (TEE);  Surgeon: [name], MD;  Location: [place];  Service: Cardiovascular;  Laterality: N/A;  ? THYROIDECTOMY N/A 01/15/2019  ? Procedure: TOTAL THYROIDECTOMY;  Surgeon: Melida Quitter, MD;  Location: [place];  Service: ENT;  Laterality: N/A;  ? ? ?Current Medications: ?No outpatient medications have been marked as taking for the 06/14/21 encounter (Appointment) with Freada Bergeron, MD.  ?  ? ?Allergies:   Aspirin, Levaquin [levofloxacin in d5w], Nickel, Other, Pravastatin, Simvastatin, Statins, Allopurinol, Ampicillin, and Penicillin g  ? ?Social History  ? ?Socioeconomic History  ? Marital  status: Married  ?  Spouse name: [name]  ? Number of children: 2  ? Years of education: Not on file  ? Highest education level: Not on file  ?Occupational History  ? Occupation: retired Hydrologist  ?Tobacco Use  ? Smoking status: Never  ? Smokeless tobacco: Never  ?Vaping Use  ? Vaping Use: Never used  ?Substance and Sexual Activity  ? Alcohol use: Not Currently  ?  Comment: social  ? Drug use: No  ? Sexual activity: Not on file  ?Other Topics Concern  ? Not on file  ?Social History Narrative  ? Lives in [place], Retired  ? ?Social Determinants of Health  ? ?Financial Resource Strain: Not on file  ?Food Insecurity: Not on file  ?Transportation Needs: Not on file  ?Physical Activity: Not on file  ?Stress: Not on file  ?Social Connections: Not on file  ?  ? ?Family History: ?The patient's family  history includes Cancer in his maternal grandmother; Heart attack in his maternal grandfather, paternal grandfather, and paternal grandmother; Heart failure (age of onset: 72) in his father; Parkinson's disease (age of onset: 2) in his mother. ? ?ROS:   ?Please see the history of present illness.    ? Review of Systems  ?Constitutional:  Positive for malaise/fatigue. Negative for chills and fever.  ?HENT:  Negative for sore throat.   ?Eyes:  Negative for blurred vision.  ?Respiratory:  Negative for shortness of breath.   ?Cardiovascular:  Positive for palpitations and leg swelling. Negative for chest pain, orthopnea, claudication and PND.  ?Gastrointestinal:  Negative for nausea and vomiting.  ?Genitourinary:  Negative for hematuria.  ?Musculoskeletal:  Positive for myalgias.  ?Neurological:  Negative for dizziness and loss of consciousness.  ?Psychiatric/Behavioral:  Negative for substance abuse.    ? ?EKGs/Labs/Other Studies Reviewed:   ? ?The following studies were reviewed today: ?TTE 10/26/20: ?IMPRESSIONS  ? ? ? 1. Left ventricular ejection fraction, by estimation, is 50 to 55%. Left  ?ventricular ejection fraction by 3D  volume is 48 %. The left ventricle has  ?mildly decreased function. The left ventricle demonstrates global  ?hypokinesis. Left ventricular diastolic  ? parameters are consistent with Grade I diastolic dysfunction (

## 2021-06-13 NOTE — Progress Notes (Signed)
?Cardiology Office Note:   ? ?Date:  06/14/2021  ? ?ID:  Sean Stanley, DOB Jul 15, 1940, MRN [id number] ? ?PCP:  Kathalene Frames, MD ?  ?[facility] Providers ?Cardiologist:  Lolita Patella, MD { ? ? ?Referring MD: [name], MD  ? ? ? ?History of Present Illness:   ? ?Sean Stanley is a 81 y.o. male with a very complicated cardiac and oncology history who was previously followed by Dr. Meda Coffee who now presents to clinic for follow-up. ? ?The patient is a [place] [employer] veteran, he has been followed by Dr. Edwin Dada, cardiology is at [facility].  The patient underwent minimally invasive mitral valve repair in October 2002 at [facility] and has been followed there since then.  He was briefly seen at [facility] for episode of atrial flutter followed by cardioversion in 2014.  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 In May 2015 he underwent atrial flutter ablation.  ? ?He has also been followed by Dr. 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 He is currently in remission for both of his cancers. He has residual vocal cord dysfunction. ? ?During last visit with Dr. Meda Coffee on 03/27/20, the patient was stable from CV standpoint. He underwent TTE at [facility] 10/2019 that showed mild left ventricle dysfunction with LVEF of 45 to 50%, global longitudinal strain -12.8%, moderate concentric left ventricular hypertrophy, normal right ventricular systolic function, status post mitral valve repair with prosthetic mitral valve ring in place, with mild residual mitral regurgitation, moderately dilated left atrium, trivial tricuspid regurgitation, normal size of the right atrium and normal right-sided pressures. ? ?Last  seen in clinic 03/2021 by Dr. Rayann Heman. He requested loop at that time.  ? ?Presented to EF in 05/2021 with rapid HR found to be in atypical Aflutter that did not respond to IV metop. Seen in Afib clinic on 05/28/21 and was resumed on apixaban and recommended for DCCV. ? ?Today, he is accompanied by his wife and is doing overall okay. He reports he is back in Afib and is scheduled for cardioversion on the 28th. He had a sustain episode last Saturday (3/5). Taking an additional metoprolol did not improve his symptoms. He presented to the ED rapid heart rate and was given 10 mg Lopressor IV with no improvement. He denies syncope, PND, orthopnea, or leg swelling. ? ?He feels the palpitations with chest heaviness worse at night, especially when laying on his L-side. Mild activities like getting dressed will cause him to become short of breath.  ? ?Recently, his blood pressure at home has been 841Y systolic. His wife reports he had atrial flutter in 2013, underwent 2 cardioversion and 1 ablation procedures, and was able to stop blood thinners. He is hesitant about taking blood thinners because of his aneurysms. He is not interested in Watchman Device implantation at this time. ? ?He tried pravastatin but experienced myalgias and stopped. He was offered injection cholesterol therapy. He is interested in seeing the [facility].  ? 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  ? ?Past Medical History:  ?Diagnosis Date  ? Abdominal aortic aneurysm, ruptured 2014  ? 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 followed at [facility]  ? 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 being followed at [facility]  ? 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 s/p coiling 12/16/12 - [facility]  ? Atrial flutter ([place])    ? BPH (benign prostatic hyperplasia)   ? Cancer [facility])   ? melanoma on lower right back and left chest - surgically removed and cleared  ? Dysrhythmia   ? H/O agent Orange exposure   ? Headache   ? migraine- not current  ? High bilirubin   ? 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 History of blood transfusion   ? Hypercholesteremia   ? Hypercholesterolemia   ? Lymphoma ([place]) 04/2018  ? Mitral valve disease   ? annuloplasty 2002 [facility]  ? Neuropathy   ? Neuropathy of both feet   ? 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 OSA (obstructive sleep apnea)   ? does not use cpap, Dr. Maxwell Caul told him he had improved  ? Pneumonia   ? Thoracic ascending aortic aneurysm   ? 4.5 cm 03/2018 CT  ? Thyroid cancer ([place])   ? ? ?Past Surgical History:  ?Procedure Laterality Date  ? ABDOMINAL ANGIOGRAM  08/05/2012  ? aneurysm repair    ? CARDIOVERSION  02/12/2012  ? Procedure: CARDIOVERSION;  Surgeon: Pixie Casino, MD;  Location: [place];  Service: Cardiovascular;  Laterality: N/A;  ? CARDIOVERSION N/A 06/22/2013  ? Procedure: CARDIOVERSION;  Surgeon: [name], MD;  Location: [place];  Service: Cardiovascular;  Laterality: N/A;  ? CATARACT EXTRACTION Bilateral 2018  ? with lens implant  ? CHOLECYSTECTOMY    ? COLONOSCOPY    ? ESOPHAGOGASTRODUODENOSCOPY    ? implantable loop recorder placement  03/29/2021  ? Medtronic Reveal Linq model [place] 22 [id number]) implantable loop recorder  ? IR IMAGING GUIDED PORT INSERTION  05/26/2018  ? IR REMOVAL TUN ACCESS W/ PORT W/O FL MOD SED  02/05/2021  ? LYMPH NODE BIOPSY Left 04/27/2018  ? Procedure: EXCISIONAL BIOPSY OF LEFT CERVICAL LYMPH NODE;  Surgeon: Melida Quitter, MD;  Location: [place];  Service: ENT;  Laterality: Left;  ? LYMPH NODE BIOPSY Left 11/23/2018  ? Procedure: LEFT CERVICAL LYMPH NODE OPEN BIOPSY;  Surgeon: Melida Quitter, MD;  Location: [place];  Service: ENT;  Laterality: Left;  ? MENISCUS REPAIR Right 2009  ? MITRAL VALVE REPAIR  2002  ? [facility]  ? NM MYOVIEW LTD  07/22/2006   ? no ischemia  ? RADICAL NECK DISSECTION Left 01/15/2019  ? Procedure: LEFT NECK DISSECTION;  Surgeon: Melida Quitter, MD;  Location: [place];  Service: ENT;  Laterality: Left;  ? RIGHT HEART CATH  06/19/2004  ? normal right heart dynamics. EF 50%  ? TEE WITHOUT CARDIOVERSION  02/12/2012  ? Procedure: TRANSESOPHAGEAL ECHOCARDIOGRAM (TEE);  Surgeon: Pixie Casino, MD;  Location: [place];  Service: Cardiovascular;  Laterality: N/A;  ? TEE WITHOUT CARDIOVERSION N/A 06/22/2013  ? Procedure: TRANSESOPHAGEAL ECHOCARDIOGRAM (TEE);  Surgeon: [name], MD;  Location: [place];  Service: Cardiovascular;  Laterality: N/A;  ? THYROIDECTOMY N/A 01/15/2019  ? Procedure: TOTAL THYROIDECTOMY;  Surgeon: Melida Quitter, MD;  Location: [place];  Service: ENT;  Laterality: N/A;  ? ? ?Current Medications: ?Current Meds  ?Medication Sig  ? acetaminophen (TYLENOL) 500 MG tablet Take  1,000 mg by mouth daily as needed for moderate pain.  ? alfuzosin (UROXATRAL) 10 MG 24 hr tablet Take 10 mg by mouth at bedtime.   ? apixaban (ELIQUIS) 5 MG TABS tablet Take 1 tablet (5 mg total) by mouth 2 (two) times daily.  ? buPROPion (WELLBUTRIN XL) 150 MG 24 hr tablet Take 1 tablet (150 mg total) by mouth daily.  ? Cholecalciferol (VITAMIN D) 2000 units tablet Take 2,000 Units by mouth at bedtime.   ? ezetimibe (ZETIA) 10 MG tablet Take 1 tablet (10 mg total) by mouth every morning.  ? finasteride (PROSCAR) 5 MG tablet Take 5 mg by mouth daily with supper.  ? levothyroxine (SYNTHROID) 175 MCG tablet Take 1 tablet (175 mcg total) by mouth daily.  ? lidocaine-prilocaine (EMLA) cream Apply to affected area once before sleep time-  ? metoprolol succinate (TOPROL-XL) 25 MG 24 hr tablet Take 12.5 mg by mouth 2 (two) times daily.  ? Polyethyl Glycol-Propyl Glycol (SYSTANE HYDRATION PF OP) Place 1 drop into both eyes 3 (three) times daily.  ?  ? ?Allergies:   Aspirin, Levaquin [levofloxacin in d5w], Nickel, Other, Pravastatin, Simvastatin,  Statins, Allopurinol, Ampicillin, and Penicillin g  ? ?Social History  ? ?Socioeconomic History  ? Marital status: Married  ?  Spouse name: [name]  ? Number of children: 2  ? Years of education: Not on fil

## 2021-06-13 NOTE — H&P (View-Only) (Signed)
?Cardiology Office Note:   ? ?Date:  06/14/2021  ? ?ID:  Sean Stanley, DOB 12/28/1940, MRN [id number] ? ?PCP:  Kathalene Frames, MD ?  ?[facility] Providers ?Cardiologist:  Lolita Patella, MD { ? ? ?Referring MD: [name], MD  ? ? ? ?History of Present Illness:   ? ?Sean Stanley is a 81 y.o. male with a very complicated cardiac and oncology history who was previously followed by Dr. Meda Coffee who now presents to clinic for follow-up. ? ?The patient is a [place] [employer] veteran, he has been followed by Dr. Edwin Dada, cardiology is at [facility].  The patient underwent minimally invasive mitral valve repair in October 2002 at [facility] and has been followed there since then.  He was briefly seen at [facility] for episode of atrial flutter followed by cardioversion in 2014.  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 In May 2015 he underwent atrial flutter ablation.  ? ?He has also been followed by Dr. 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 He is currently in remission for both of his cancers. He has residual vocal cord dysfunction. ? ?During last visit with Dr. Meda Coffee on 03/27/20, the patient was stable from CV standpoint. He underwent TTE at [facility] 10/2019 that showed mild left ventricle dysfunction with LVEF of 45 to 50%, global longitudinal strain -12.8%, moderate concentric left ventricular hypertrophy, normal right ventricular systolic function, status post mitral valve repair with prosthetic mitral valve ring in place, with mild residual mitral regurgitation, moderately dilated left atrium, trivial tricuspid regurgitation, normal size of the right atrium and normal right-sided pressures. ? ?Last  seen in clinic 03/2021 by Dr. Rayann Heman. He requested loop at that time.  ? ?Presented to EF in 05/2021 with rapid HR found to be in atypical Aflutter that did not respond to IV metop. Seen in Afib clinic on 05/28/21 and was resumed on apixaban and recommended for DCCV. ? ?Today, he is accompanied by his wife and is doing overall okay. He reports he is back in Afib and is scheduled for cardioversion on the 28th. He had a sustain episode last Saturday (3/5). Taking an additional metoprolol did not improve his symptoms. He presented to the ED rapid heart rate and was given 10 mg Lopressor IV with no improvement. He denies syncope, PND, orthopnea, or leg swelling. ? ?He feels the palpitations with chest heaviness worse at night, especially when laying on his L-side. Mild activities like getting dressed will cause him to become short of breath.  ? ?Recently, his blood pressure at home has been 016W systolic. His wife reports he had atrial flutter in 2013, underwent 2 cardioversion and 1 ablation procedures, and was able to stop blood thinners. He is hesitant about taking blood thinners because of his aneurysms. He is not interested in Watchman Device implantation at this time. ? ?He tried pravastatin but experienced myalgias and stopped. He was offered injection cholesterol therapy. He is interested in seeing the [facility].  ? 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  ? ?Past Medical History:  ?Diagnosis Date  ? Abdominal aortic aneurysm, ruptured 2014  ? 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 followed at [facility]  ? 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 being followed at [facility]  ? 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 s/p coiling 12/16/12 - [facility]  ? Atrial flutter ([place])    ? BPH (benign prostatic hyperplasia)   ? Cancer [facility])   ? melanoma on lower right back and left chest - surgically removed and cleared  ? Dysrhythmia   ? H/O agent Orange exposure   ? Headache   ? migraine- not current  ? High bilirubin   ? 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 History of blood transfusion   ? Hypercholesteremia   ? Hypercholesterolemia   ? Lymphoma ([place]) 04/2018  ? Mitral valve disease   ? annuloplasty 2002 [facility]  ? Neuropathy   ? Neuropathy of both feet   ? 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 OSA (obstructive sleep apnea)   ? does not use cpap, Dr. Maxwell Caul told him he had improved  ? Pneumonia   ? Thoracic ascending aortic aneurysm   ? 4.5 cm 03/2018 CT  ? Thyroid cancer ([place])   ? ? ?Past Surgical History:  ?Procedure Laterality Date  ? ABDOMINAL ANGIOGRAM  08/05/2012  ? aneurysm repair    ? CARDIOVERSION  02/12/2012  ? Procedure: CARDIOVERSION;  Surgeon: Pixie Casino, MD;  Location: [place];  Service: Cardiovascular;  Laterality: N/A;  ? CARDIOVERSION N/A 06/22/2013  ? Procedure: CARDIOVERSION;  Surgeon: [name], MD;  Location: [place];  Service: Cardiovascular;  Laterality: N/A;  ? CATARACT EXTRACTION Bilateral 2018  ? with lens implant  ? CHOLECYSTECTOMY    ? COLONOSCOPY    ? ESOPHAGOGASTRODUODENOSCOPY    ? implantable loop recorder placement  03/29/2021  ? Medtronic Reveal Linq model [place] 22 [id number]) implantable loop recorder  ? IR IMAGING GUIDED PORT INSERTION  05/26/2018  ? IR REMOVAL TUN ACCESS W/ PORT W/O FL MOD SED  02/05/2021  ? LYMPH NODE BIOPSY Left 04/27/2018  ? Procedure: EXCISIONAL BIOPSY OF LEFT CERVICAL LYMPH NODE;  Surgeon: Melida Quitter, MD;  Location: [place];  Service: ENT;  Laterality: Left;  ? LYMPH NODE BIOPSY Left 11/23/2018  ? Procedure: LEFT CERVICAL LYMPH NODE OPEN BIOPSY;  Surgeon: Melida Quitter, MD;  Location: [place];  Service: ENT;  Laterality: Left;  ? MENISCUS REPAIR Right 2009  ? MITRAL VALVE REPAIR  2002  ? [facility]  ? NM MYOVIEW LTD  07/22/2006   ? no ischemia  ? RADICAL NECK DISSECTION Left 01/15/2019  ? Procedure: LEFT NECK DISSECTION;  Surgeon: Melida Quitter, MD;  Location: [place];  Service: ENT;  Laterality: Left;  ? RIGHT HEART CATH  06/19/2004  ? normal right heart dynamics. EF 50%  ? TEE WITHOUT CARDIOVERSION  02/12/2012  ? Procedure: TRANSESOPHAGEAL ECHOCARDIOGRAM (TEE);  Surgeon: Pixie Casino, MD;  Location: [place];  Service: Cardiovascular;  Laterality: N/A;  ? TEE WITHOUT CARDIOVERSION N/A 06/22/2013  ? Procedure: TRANSESOPHAGEAL ECHOCARDIOGRAM (TEE);  Surgeon: [name], MD;  Location: [place];  Service: Cardiovascular;  Laterality: N/A;  ? THYROIDECTOMY N/A 01/15/2019  ? Procedure: TOTAL THYROIDECTOMY;  Surgeon: Melida Quitter, MD;  Location: [place];  Service: ENT;  Laterality: N/A;  ? ? ?Current Medications: ?Current Meds  ?Medication Sig  ? acetaminophen (TYLENOL) 500 MG tablet Take  1,000 mg by mouth daily as needed for moderate pain.  ? alfuzosin (UROXATRAL) 10 MG 24 hr tablet Take 10 mg by mouth at bedtime.   ? apixaban (ELIQUIS) 5 MG TABS tablet Take 1 tablet (5 mg total) by mouth 2 (two) times daily.  ? buPROPion (WELLBUTRIN XL) 150 MG 24 hr tablet Take 1 tablet (150 mg total) by mouth daily.  ? Cholecalciferol (VITAMIN D) 2000 units tablet Take 2,000 Units by mouth at bedtime.   ? ezetimibe (ZETIA) 10 MG tablet Take 1 tablet (10 mg total) by mouth every morning.  ? finasteride (PROSCAR) 5 MG tablet Take 5 mg by mouth daily with supper.  ? levothyroxine (SYNTHROID) 175 MCG tablet Take 1 tablet (175 mcg total) by mouth daily.  ? lidocaine-prilocaine (EMLA) cream Apply to affected area once before sleep time-  ? metoprolol succinate (TOPROL-XL) 25 MG 24 hr tablet Take 12.5 mg by mouth 2 (two) times daily.  ? Polyethyl Glycol-Propyl Glycol (SYSTANE HYDRATION PF OP) Place 1 drop into both eyes 3 (three) times daily.  ?  ? ?Allergies:   Aspirin, Levaquin [levofloxacin in d5w], Nickel, Other, Pravastatin, Simvastatin,  Statins, Allopurinol, Ampicillin, and Penicillin g  ? ?Social History  ? ?Socioeconomic History  ? Marital status: Married  ?  Spouse name: [name]  ? Number of children: 2  ? Years of education: Not on fil

## 2021-06-14 ENCOUNTER — Ambulatory Visit (INDEPENDENT_AMBULATORY_CARE_PROVIDER_SITE_OTHER): Payer: Medicare Other | Admitting: Cardiology

## 2021-06-14 ENCOUNTER — Other Ambulatory Visit: Payer: Medicare Other

## 2021-06-14 ENCOUNTER — Ambulatory Visit: Payer: Medicare Other | Admitting: Hematology and Oncology

## 2021-06-14 ENCOUNTER — Other Ambulatory Visit: Payer: Self-pay

## 2021-06-14 ENCOUNTER — Encounter: Payer: Self-pay | Admitting: Cardiology

## 2021-06-14 VITALS — BP 122/72 | HR 112 | Ht 73.0 in | Wt 219.8 lb

## 2021-06-14 DIAGNOSIS — Z954 Presence of other heart-valve replacement: Secondary | ICD-10-CM | POA: Diagnosis not present

## 2021-06-14 DIAGNOSIS — I340 Nonrheumatic mitral (valve) insufficiency: Secondary | ICD-10-CM | POA: Diagnosis not present

## 2021-06-14 DIAGNOSIS — I059 Rheumatic mitral valve disease, unspecified: Secondary | ICD-10-CM

## 2021-06-14 DIAGNOSIS — I2510 Atherosclerotic heart disease of native coronary artery without angina pectoris: Secondary | ICD-10-CM

## 2021-06-14 DIAGNOSIS — E785 Hyperlipidemia, unspecified: Secondary | ICD-10-CM | POA: Diagnosis not present

## 2021-06-14 DIAGNOSIS — Z789 Other specified health status: Secondary | ICD-10-CM

## 2021-06-14 DIAGNOSIS — Z9889 Other specified postprocedural states: Secondary | ICD-10-CM | POA: Diagnosis not present

## 2021-06-14 DIAGNOSIS — D6869 Other thrombophilia: Secondary | ICD-10-CM

## 2021-06-14 DIAGNOSIS — I7143 Infrarenal abdominal aortic aneurysm, without rupture: Secondary | ICD-10-CM | POA: Diagnosis not present

## 2021-06-14 DIAGNOSIS — I480 Paroxysmal atrial fibrillation: Secondary | ICD-10-CM | POA: Diagnosis not present

## 2021-06-14 NOTE — Patient Instructions (Signed)
Medication Instructions:  ? ?Your physician recommends that you continue on your current medications as directed. Please refer to the Current Medication list given to you today. ? ?*If you need a refill on your cardiac medications before your next appointment, please call your pharmacy* ? ? ?You have been referred to [facility] TO SEE THE PHARMACIST FOR STATIN INTOLERANCE ? ? ?Testing/Procedures: ? ?Your physician has requested that you have an echocardiogram. Echocardiography is a painless test that uses sound waves to create images of your heart. It provides your doctor with information about the size and shape of your heart and how well your heart?s chambers and valves are working. This procedure takes approximately one hour. There are no restrictions for this procedure.  PLEASE SCHEDULE TO BE DONE IN AUGUST 2023 PER DR. Johney Frame ? ? ?Follow-Up: ?At [facility], you and your health needs are our priority.  As part of our continuing mission to provide you with exceptional heart care, we have created designated Provider Care Teams.  These Care Teams include your primary Cardiologist (physician) and Advanced Practice Providers (APPs -  Physician Assistants and Nurse Practitioners) who all work together to provide you with the care you need, when you need it. ? ?We recommend signing up for the patient portal called "MyChart".  Sign up information is provided on this After Visit Summary.  MyChart is used to connect with patients for Virtual Visits (Telemedicine).  Patients are able to view lab/test results, encounter notes, upcoming appointments, etc.  Non-urgent messages can be sent to your provider as well.   ?To learn more about what you can do with MyChart, go to [email or web address].   ? ?Your next appointment:   ?6 month(s) ? ?The format for your next appointment:   ?In Person ? ?Provider:   ?DR. PEMBERTON ? ?

## 2021-06-14 NOTE — Progress Notes (Signed)
[facility] Summary Report / Loop Recorder 

## 2021-06-18 DIAGNOSIS — G609 Hereditary and idiopathic neuropathy, unspecified: Secondary | ICD-10-CM | POA: Diagnosis not present

## 2021-06-18 DIAGNOSIS — C73 Malignant neoplasm of thyroid gland: Secondary | ICD-10-CM | POA: Diagnosis not present

## 2021-06-18 DIAGNOSIS — I2510 Atherosclerotic heart disease of native coronary artery without angina pectoris: Secondary | ICD-10-CM | POA: Diagnosis not present

## 2021-06-18 DIAGNOSIS — N1832 Chronic kidney disease, stage 3b: Secondary | ICD-10-CM | POA: Diagnosis not present

## 2021-06-18 DIAGNOSIS — I700 Atherosclerosis of aorta: Secondary | ICD-10-CM | POA: Diagnosis not present

## 2021-06-18 DIAGNOSIS — I429 Cardiomyopathy, unspecified: Secondary | ICD-10-CM | POA: Diagnosis not present

## 2021-06-18 DIAGNOSIS — E785 Hyperlipidemia, unspecified: Secondary | ICD-10-CM | POA: Diagnosis not present

## 2021-06-18 DIAGNOSIS — N401 Enlarged prostate with lower urinary tract symptoms: Secondary | ICD-10-CM | POA: Diagnosis not present

## 2021-06-18 DIAGNOSIS — C8310 Mantle cell lymphoma, unspecified site: Secondary | ICD-10-CM | POA: Diagnosis not present

## 2021-06-18 DIAGNOSIS — I4892 Unspecified atrial flutter: Secondary | ICD-10-CM | POA: Diagnosis not present

## 2021-06-18 DIAGNOSIS — E890 Postprocedural hypothyroidism: Secondary | ICD-10-CM | POA: Diagnosis not present

## 2021-06-18 DIAGNOSIS — J3800 Paralysis of vocal cords and larynx, unspecified: Secondary | ICD-10-CM | POA: Diagnosis not present

## 2021-06-19 ENCOUNTER — Ambulatory Visit (HOSPITAL_BASED_OUTPATIENT_CLINIC_OR_DEPARTMENT_OTHER): Payer: Medicare Other | Admitting: Anesthesiology

## 2021-06-19 ENCOUNTER — Ambulatory Visit (HOSPITAL_COMMUNITY)
Admission: RE | Admit: 2021-06-19 | Discharge: 2021-06-19 | Disposition: A | Discharge status: Home / Self Care | Payer: Medicare Other | Attending: Internal Medicine | Admitting: Internal Medicine

## 2021-06-19 ENCOUNTER — Ambulatory Visit (HOSPITAL_COMMUNITY)
Admission: RE | Admit: 2021-06-19 | Discharge: 2021-06-19 | Disposition: A | Discharge status: Home / Self Care | Payer: Medicare Other | Source: Ambulatory Visit | Attending: Physician Assistant | Admitting: Physician Assistant

## 2021-06-19 ENCOUNTER — Ambulatory Visit (HOSPITAL_COMMUNITY): Payer: Medicare Other | Admitting: Anesthesiology

## 2021-06-19 ENCOUNTER — Encounter (HOSPITAL_COMMUNITY): Payer: Self-pay | Admitting: Internal Medicine

## 2021-06-19 ENCOUNTER — Encounter (HOSPITAL_COMMUNITY)
Admission: RE | Disposition: A | Discharge status: Home / Self Care | Payer: Self-pay | Source: Home / Self Care | Attending: Internal Medicine

## 2021-06-19 ENCOUNTER — Other Ambulatory Visit (HOSPITAL_COMMUNITY): Payer: Self-pay | Admitting: *Deleted

## 2021-06-19 ENCOUNTER — Other Ambulatory Visit: Payer: Self-pay

## 2021-06-19 DIAGNOSIS — I7143 Infrarenal abdominal aortic aneurysm, without rupture: Secondary | ICD-10-CM | POA: Insufficient documentation

## 2021-06-19 DIAGNOSIS — I4891 Unspecified atrial fibrillation: Secondary | ICD-10-CM | POA: Diagnosis not present

## 2021-06-19 DIAGNOSIS — D6869 Other thrombophilia: Secondary | ICD-10-CM | POA: Insufficient documentation

## 2021-06-19 DIAGNOSIS — Z8572 Personal history of non-Hodgkin lymphomas: Secondary | ICD-10-CM | POA: Diagnosis not present

## 2021-06-19 DIAGNOSIS — J189 Pneumonia, unspecified organism: Secondary | ICD-10-CM | POA: Diagnosis not present

## 2021-06-19 DIAGNOSIS — G4733 Obstructive sleep apnea (adult) (pediatric): Secondary | ICD-10-CM | POA: Insufficient documentation

## 2021-06-19 DIAGNOSIS — I2510 Atherosclerotic heart disease of native coronary artery without angina pectoris: Secondary | ICD-10-CM | POA: Insufficient documentation

## 2021-06-19 DIAGNOSIS — Z85850 Personal history of malignant neoplasm of thyroid: Secondary | ICD-10-CM | POA: Insufficient documentation

## 2021-06-19 DIAGNOSIS — G4730 Sleep apnea, unspecified: Secondary | ICD-10-CM | POA: Diagnosis not present

## 2021-06-19 DIAGNOSIS — I4892 Unspecified atrial flutter: Secondary | ICD-10-CM

## 2021-06-19 DIAGNOSIS — J3800 Paralysis of vocal cords and larynx, unspecified: Secondary | ICD-10-CM | POA: Diagnosis not present

## 2021-06-19 DIAGNOSIS — I480 Paroxysmal atrial fibrillation: Secondary | ICD-10-CM | POA: Diagnosis not present

## 2021-06-19 DIAGNOSIS — I739 Peripheral vascular disease, unspecified: Secondary | ICD-10-CM | POA: Diagnosis not present

## 2021-06-19 DIAGNOSIS — I340 Nonrheumatic mitral (valve) insufficiency: Secondary | ICD-10-CM | POA: Insufficient documentation

## 2021-06-19 DIAGNOSIS — I7121 Aneurysm of the ascending aorta, without rupture: Secondary | ICD-10-CM | POA: Diagnosis not present

## 2021-06-19 DIAGNOSIS — E785 Hyperlipidemia, unspecified: Secondary | ICD-10-CM | POA: Diagnosis not present

## 2021-06-19 DIAGNOSIS — I509 Heart failure, unspecified: Secondary | ICD-10-CM | POA: Insufficient documentation

## 2021-06-19 DIAGNOSIS — D649 Anemia, unspecified: Secondary | ICD-10-CM | POA: Diagnosis not present

## 2021-06-19 HISTORY — DX: Failed or difficult intubation, initial encounter: T88.4XXA

## 2021-06-19 HISTORY — PX: CARDIOVERSION: SHX1299

## 2021-06-19 LAB — CBC
HCT: 41.9 % (ref 39.0–52.0)
Hemoglobin: 13.3 g/dL (ref 13.0–17.0)
MCH: 30 pg (ref 26.0–34.0)
MCHC: 31.7 g/dL (ref 30.0–36.0)
MCV: 94.6 fL (ref 80.0–100.0)
Platelets: 166 10*3/uL (ref 150–400)
RBC: 4.43 MIL/uL (ref 4.22–5.81)
RDW: 12.9 % (ref 11.5–15.5)
WBC: 5.8 10*3/uL (ref 4.0–10.5)
nRBC: 0 % (ref 0.0–0.2)

## 2021-06-19 LAB — BASIC METABOLIC PANEL
Anion gap: 6 (ref 5–15)
BUN: 21 mg/dL (ref 8–23)
CO2: 26 mmol/L (ref 22–32)
Calcium: 8.8 mg/dL — ABNORMAL LOW (ref 8.9–10.3)
Chloride: 108 mmol/L (ref 98–111)
Creatinine, Ser: 1.31 mg/dL — ABNORMAL HIGH (ref 0.61–1.24)
GFR, Estimated: 55 mL/min — ABNORMAL LOW (ref >60)
Glucose, Bld: 96 mg/dL (ref 70–99)
Potassium: 4.7 mmol/L (ref 3.5–5.1)
Sodium: 140 mmol/L (ref 135–145)

## 2021-06-19 LAB — POCT I-STAT, CHEM 8
BUN: 22 mg/dL (ref 8–23)
Calcium, Ion: 1.14 mmol/L — ABNORMAL LOW (ref 1.15–1.40)
Chloride: 105 mmol/L (ref 98–111)
Creatinine, Ser: 1.3 mg/dL — ABNORMAL HIGH (ref 0.61–1.24)
Glucose, Bld: 92 mg/dL (ref 70–99)
HCT: 40 % (ref 39.0–52.0)
Hemoglobin: 13.6 g/dL (ref 13.0–17.0)
Potassium: 4.3 mmol/L (ref 3.5–5.1)
Sodium: 141 mmol/L (ref 135–145)
TCO2: 26 mmol/L (ref 22–32)

## 2021-06-19 SURGERY — CARDIOVERSION
Anesthesia: General

## 2021-06-19 MED ORDER — SODIUM CHLORIDE 0.9 % IV SOLN: INTRAVENOUS | Status: DC

## 2021-06-19 MED ORDER — PROPOFOL 10 MG/ML IV BOLUS
INTRAVENOUS | Status: DC | PRN
  Administered 2021-06-19: 70 mg via INTRAVENOUS

## 2021-06-19 MED ORDER — LIDOCAINE 2% (20 MG/ML) 5 ML SYRINGE
INTRAMUSCULAR | Status: DC | PRN
  Administered 2021-06-19: 100 mg via INTRAVENOUS

## 2021-06-19 NOTE — Anesthesia Preprocedure Evaluation (Addendum)
Anesthesia Evaluation  ?Patient identified by MRN, date of birth, ID band ?Patient awake ? ? ? ?Reviewed: ?Allergy & Precautions, NPO status , Patient's Chart, lab work & pertinent test results, reviewed documented beta blocker date and time  ? ?History of Anesthesia Complications ?(+) DIFFICULT AIRWAY and history of anesthetic complications ? ?Airway ?Mallampati: III ? ?TM Distance: >3 FB ?Neck ROM: Full ? ? ? Dental ? ?(+) Dental Advisory Given, Teeth Intact ?  ?Pulmonary ?sleep apnea , pneumonia,  ?  ?Pulmonary exam normal ?breath sounds clear to auscultation ? ? ? ? ? ? Cardiovascular ?Pt. on home beta blockers ?+ CAD and + Peripheral Vascular Disease  ?+ dysrhythmias  ?Rhythm:Regular Rate:Tachycardia ? ?Echo 10/2020 ??1. Left ventricular ejection fraction, by estimation, is 50 to 55%. Left ventricular ejection fraction by 3D volume is 48%. The left ventricle has mildly decreased function. The left ventricle demonstrates global hypokinesis. Left ventricular diastolic ?parameters are consistent with Grade I diastolic dysfunction (impaired relaxation). Elevated left ventricular end-diastolic pressure. The average left ventricular global longitudinal strain is 18.8 %. The global longitudinal strain is normal.  ??2. Right ventricular systolic function is low normal. The right ventricular size is normal. There is normal pulmonary artery systolic pressure.  ??3. Left atrial size was mild to moderately dilated.  ??4. Right atrial size was mildly dilated.  ??5. The mitral valve has been repaired/replaced. Trivial mitral valve regurgitation. The mean mitral valve gradient is 1.0 mmHg with average heart rate of 58 bpm. There is a prosthetic annuloplasty ring present in the mitral position. Procedure Date: 2002.  ??6. The aortic valve is tricuspid. Aortic valve regurgitation is not visualized. Mild aortic valve sclerosis is present, with no evidence of aortic valve stenosis.  ??7. Aortic  dilatation noted. There is mild dilatation of the aortic root, measuring 39 mm. There is mild dilatation of the ascending aorta, measuring 41 mm.  ?  ?Neuro/Psych ? Headaches,  Neuromuscular disease   ? GI/Hepatic ?negative GI ROS, Neg liver ROS,   ?Endo/Other  ?negative endocrine ROS ? Renal/GU ?Renal disease  ? ?  ?Musculoskeletal ?negative musculoskeletal ROS ?(+)  ? Abdominal ?  ?Peds ? Hematology ? ?(+) Blood dyscrasia, anemia ,   ?Anesthesia Other Findings ? ? Reproductive/Obstetrics ? ?  ? ? ? ? ? ? ? ? ? ? ? ? ? ?  ?  ? ? ? ? ? ? ?Anesthesia Physical ?Anesthesia Plan ? ?ASA: 3 ? ?Anesthesia Plan: General  ? ?Post-op Pain Management: Minimal or no pain anticipated  ? ?Induction: Intravenous ? ?PONV Risk Score and Plan: 2 and Propofol infusion, TIVA and Treatment may vary due to age or medical condition ? ?Airway Management Planned: Mask ? ?Additional Equipment:  ? ?Intra-op Plan:  ? ?Post-operative Plan:  ? ?Informed Consent: I have reviewed the patients History and Physical, chart, labs and discussed the procedure including the risks, benefits and alternatives for the proposed anesthesia with the patient or authorized representative who has indicated his/her understanding and acceptance.  ? ? ? ?Dental advisory given ? ?Plan Discussed with: CRNA ? ?Anesthesia Plan Comments:   ? ? ? ? ? ?Anesthesia Quick Evaluation ? ?

## 2021-06-19 NOTE — Transfer of Care (Signed)
Immediate Anesthesia Transfer of Care Note ? ?Patient: Sean Stanley ? ?Procedure(s) Performed: CARDIOVERSION ? ?Patient Location: Endoscopy Unit ? ?Anesthesia Type:General ? ?Level of Consciousness: drowsy and patient cooperative ? ?Airway & Oxygen Therapy: Patient Spontanous Breathing ? ?Post-op Assessment: Report given to RN and Post -op Vital signs reviewed and stable ? ?Post vital signs: Reviewed and stable ? ?Last Vitals:  ?Vitals Value Taken Time  ?BP    ?Temp    ?Pulse    ?Resp    ?SpO2    ? ? ?Last Pain:  ?Vitals:  ? 06/19/21 1042  ?TempSrc: Temporal  ?PainSc: 0-No pain  ?   ? ?  ? ?Complications: No notable events documented. ?

## 2021-06-19 NOTE — Discharge Instructions (Signed)

## 2021-06-19 NOTE — Interval H&P Note (Signed)
History and Physical Interval Note: ? ?06/19/2021 ?11:06 AM ? ?Sean Stanley  has presented today for surgery, with the diagnosis of AFIB.  The various methods of treatment have been discussed with the patient and family. After consideration of risks, benefits and other options for treatment, the patient has consented to  Procedure(s): ?CARDIOVERSION (N/A) as a surgical intervention.  The patient's history has been reviewed, patient examined, no change in status, stable for surgery.  I have reviewed the patient's chart and labs.  Questions were answered to the patient's satisfaction.   ? ?Re-entry from early this AM. ? ? ?Sean Stanley ? ? ?

## 2021-06-19 NOTE — CV Procedure (Signed)
? ?  Electrical Cardioversion Procedure Note ?Sean Stanley ?[id number] ?04-14-1940 ? ?Procedure: Electrical Cardioversion ?Indications:  Atrial Flutter ? ?Time Out: Verified patient identification, verified procedure,medications/allergies/relevent history reviewed, required imaging and test results available.  Performed ? ?Procedure Details ? ?The patient was NPO after midnight. Anesthesia was administered at the beside  by Dr.Germetroth with '100mg'$  of lidocaine and 80 mg propofol.  Cardioversion was done with synchronized biphasic defibrillation with AP pads with 200 Joules.  The patient converted to normal sinus rhythm. The patient tolerated the procedure well  ? ?IMPRESSION: ? ?Successful cardioversion of atrial flutter ? ? ? ?Sean Stanley ?06/19/2021, 11:06 AM ? ? ?

## 2021-06-20 ENCOUNTER — Encounter (HOSPITAL_COMMUNITY): Payer: Self-pay | Admitting: Internal Medicine

## 2021-06-20 NOTE — Anesthesia Postprocedure Evaluation (Signed)
Anesthesia Post Note ? ?Patient: Sean Stanley ? ?Procedure(s) Performed: CARDIOVERSION ? ?  ? ?Patient location during evaluation: Endoscopy ?Anesthesia Type: General ?Level of consciousness: sedated and patient cooperative ?Pain management: pain level controlled ?Vital Signs Assessment: post-procedure vital signs reviewed and stable ?Respiratory status: spontaneous breathing ?Cardiovascular status: stable ?Anesthetic complications: no ? ? ?No notable events documented. ? ?Last Vitals:  ?Vitals:  ? 06/19/21 1120 06/19/21 1130  ?BP: 113/74 119/74  ?Pulse: 64 64  ?Resp: 20 15  ?Temp:    ?SpO2: 98% 97%  ?  ?Last Pain:  ?Vitals:  ? 06/19/21 1130  ?TempSrc:   ?PainSc: 0-No pain  ? ? ?  ?  ?  ?  ?  ?  ? ?Nolon Nations ? ? ? ? ?

## 2021-06-22 ENCOUNTER — Encounter: Payer: Self-pay | Admitting: Hematology and Oncology

## 2021-06-22 DIAGNOSIS — H40013 Open angle with borderline findings, low risk, bilateral: Secondary | ICD-10-CM | POA: Diagnosis not present

## 2021-06-22 DIAGNOSIS — H04123 Dry eye syndrome of bilateral lacrimal glands: Secondary | ICD-10-CM | POA: Diagnosis not present

## 2021-06-28 ENCOUNTER — Encounter: Payer: Self-pay | Admitting: Cardiology

## 2021-06-29 NOTE — Telephone Encounter (Signed)
Pt will see DOD Dr. Curt Bears on next Tuesday 4/11 at 0800. He is aware to arrive 15 mins prior to this appt.  ?Pt is scheduled to see Dr. Rayann Heman at [facility] for post cardioversion follow-up on 4/14, but this may be cancelled if pt is improving with his symptoms when seeing Dr. Curt Bears on 4/11.  ?Pt and wife verbalized understanding and agrees with this plan. ?

## 2021-06-29 NOTE — Telephone Encounter (Signed)
-----   Message from Freada Bergeron, MD sent at 06/28/2021  6:51 PM EDT ----- ?Can we get him in with APP or DOD next week? ? ?Thank you!! ? ?

## 2021-07-03 ENCOUNTER — Ambulatory Visit (INDEPENDENT_AMBULATORY_CARE_PROVIDER_SITE_OTHER): Payer: Medicare Other | Admitting: Cardiology

## 2021-07-03 ENCOUNTER — Encounter: Payer: Self-pay | Admitting: Cardiology

## 2021-07-03 VITALS — BP 122/68 | HR 73 | Ht 73.0 in | Wt 219.0 lb

## 2021-07-03 DIAGNOSIS — D6869 Other thrombophilia: Secondary | ICD-10-CM | POA: Diagnosis not present

## 2021-07-03 DIAGNOSIS — I480 Paroxysmal atrial fibrillation: Secondary | ICD-10-CM

## 2021-07-03 DIAGNOSIS — I2510 Atherosclerotic heart disease of native coronary artery without angina pectoris: Secondary | ICD-10-CM | POA: Diagnosis not present

## 2021-07-03 NOTE — Patient Instructions (Addendum)
Medication Instructions:  Your physician recommends that you continue on your current medications as directed. Please refer to the Current Medication list given to you today. *If you need a refill on your cardiac medications before your next appointment, please call your pharmacy*  Lab Work: None. If you have labs (blood work) drawn today and your tests are completely normal, you will receive your results only by: MyChart Message (if you have MyChart) OR A paper copy in the mail If you have any lab test that is abnormal or we need to change your treatment, we will call you to review the results.  Testing/Procedures: None.  Follow-Up: At [facility], you and your health needs are our priority.  As part of our continuing mission to provide you with exceptional heart care, we have created designated Provider Care Teams.  These Care Teams include your primary Cardiologist (physician) and Advanced Practice Providers (APPs -  Physician Assistants and Nurse Practitioners) who all work together to provide you with the care you need, when you need it.  Your physician wants you to follow-up in: 6 months with one of the following Advanced Practice Providers on your designated Care Team:    Renee Ursuy, PA-C Michael "Andy" Tillery, PA-C   You will receive a reminder letter in the mail two months in advance. If you don't receive a letter, please call our office to schedule the follow-up appointment.  We recommend signing up for the patient portal called "MyChart".  Sign up information is provided on this After Visit Summary.  MyChart is used to connect with patients for Virtual Visits (Telemedicine).  Patients are able to view lab/test results, encounter notes, upcoming appointments, etc.  Non-urgent messages can be sent to your provider as well.   To learn more about what you can do with MyChart, go to [email or web address].com.    Any Other Special Instructions Will Be Listed Below (If  Applicable).         

## 2021-07-03 NOTE — Progress Notes (Signed)
? ?Electrophysiology Office Note ? ? ?Date:  07/03/2021  ? ?ID:  Sean Stanley, DOB Dec 19, 1940, MRN [id number] ? ?PCP:  Kathalene Frames, MD  ?Cardiologist:  Johney Frame ?Primary Electrophysiologist: Allred Marlissa Emerick Meredith Leeds, MD   ? ?Chief Complaint: atrial flutter ?  ?History of Present Illness: ?Sean Stanley is a 81 y.o. male who is being seen today for the evaluation of atrial flutter at the request of Kathalene Frames, *. Presenting today for electrophysiology evaluation. ? ?He has a history significant for ruptured abdominal aortic aneurysm in 2014, renal artery aneurysm, atrial flutter, hyperlipidemia, obstructive sleep apnea, thoracic aortic aneurysm, atrial fibrillation, minimally invasive mitral valve repair in 2002.  He had an atrial flutter ablation in 2015.  He has had 1 episode of atrial fibrillation around the time of a COVID-19 infection.  He has a Medtronic Linq implanted for atrial fibrillation monitoring. ? ?He is after his cardioversion, he had blood pressures that were in the 140s and heart rates that were in the mid 50s.  He felt weak and fatigued at this time.  Over the last 5 days though, he is felt much improved without symptoms.  He has been sleeping better since his cardioversion. ? ?He presented to the emergency room March 2023 with rapid heart rates and was found to be in an atypical atrial flutter.  His Eliquis was resumed and he had a cardioversion.  Unfortunately he went back into atrial fibrillation and had a repeat cardioversion 06/19/2021. ? ?Today, he denies symptoms of palpitations, chest pain, shortness of breath, orthopnea, PND, lower extremity edema, claudication, dizziness, presyncope, syncope, bleeding, or neurologic sequela. The patient is tolerating medications without difficulties.  ? ? ?Past Medical History:  ?Diagnosis Date  ? Abdominal aortic aneurysm, ruptured ([place]) 2014  ? 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 followed at [facility]  ? 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 being followed at [facility]  ? 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 s/p coiling 12/16/12 - [facility]  ? Atrial flutter ([place])   ? BPH (benign prostatic hyperplasia)   ? Cancer [facility])   ? melanoma on lower right back and left chest - surgically removed and cleared  ? Difficult intubation   ? Dysrhythmia   ? H/O agent Orange exposure   ? Headache   ? migraine- not current  ? High bilirubin   ? 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 History of blood transfusion   ? Hypercholesteremia   ? Hypercholesterolemia   ? Lymphoma ([place]) 04/2018  ? Mitral valve disease   ? annuloplasty 2002 [facility]  ? Neuropathy   ? Neuropathy of both feet   ? 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 OSA (obstructive sleep apnea)   ? does not use cpap, Dr. Maxwell Caul told him he had improved  ? Pneumonia   ? Thoracic ascending aortic aneurysm (HCC)   ? 4.5 cm 03/2018 CT  ? Thyroid cancer ([place])   ? ?Past Surgical History:  ?Procedure Laterality Date  ? ABDOMINAL ANGIOGRAM  08/05/2012  ? aneurysm repair    ? CARDIOVERSION  02/12/2012  ? Procedure: CARDIOVERSION;  Surgeon: Pixie Casino, MD;  Location: [place];  Service: Cardiovascular;  Laterality: N/A;  ? CARDIOVERSION N/A 06/22/2013  ? Procedure: CARDIOVERSION;  Surgeon: [name], MD;  Location: [place];  Service: Cardiovascular;  Laterality: N/A;  ? CARDIOVERSION N/A 06/19/2021  ? Procedure: CARDIOVERSION;  Surgeon: Werner Lean, MD;  Location: [place];  Service: Cardiovascular;  Laterality: N/A;  ? CATARACT EXTRACTION Bilateral 2018  ? with lens implant  ? CHOLECYSTECTOMY    ? COLONOSCOPY    ? ESOPHAGOGASTRODUODENOSCOPY    ? implantable loop recorder placement  03/29/2021  ? Medtronic Reveal Linq model [place] 22 [id number]) implantable loop recorder  ? IR IMAGING GUIDED  PORT INSERTION  05/26/2018  ? IR REMOVAL TUN ACCESS W/ PORT W/O FL MOD SED  02/05/2021  ? LYMPH NODE BIOPSY Left 04/27/2018  ? Procedure: EXCISIONAL BIOPSY OF LEFT CERVICAL LYMPH NODE;  Surgeon: Melida Quitter, MD;  Location: [place];  Service: ENT;  Laterality: Left;  ? LYMPH NODE BIOPSY Left 11/23/2018  ? Procedure: LEFT CERVICAL LYMPH NODE OPEN BIOPSY;  Surgeon: Melida Quitter, MD;  Location: [place];  Service: ENT;  Laterality: Left;  ? MENISCUS REPAIR Right 2009  ? MITRAL VALVE REPAIR  2002  ? [facility]  ? NM MYOVIEW LTD  07/22/2006  ? no ischemia  ? RADICAL NECK DISSECTION Left 01/15/2019  ? Procedure: LEFT NECK DISSECTION;  Surgeon: Melida Quitter, MD;  Location: [place];  Service: ENT;  Laterality: Left;  ? RIGHT HEART CATH  06/19/2004  ? normal right heart dynamics. EF 50%  ? TEE WITHOUT CARDIOVERSION  02/12/2012  ? Procedure: TRANSESOPHAGEAL ECHOCARDIOGRAM (TEE);  Surgeon: Pixie Casino, MD;  Location: [place];  Service: Cardiovascular;  Laterality: N/A;  ? TEE WITHOUT CARDIOVERSION N/A 06/22/2013  ? Procedure: TRANSESOPHAGEAL ECHOCARDIOGRAM (TEE);  Surgeon: [name], MD;  Location: [place];  Service: Cardiovascular;  Laterality: N/A;  ? THYROIDECTOMY N/A 01/15/2019  ? Procedure: TOTAL THYROIDECTOMY;  Surgeon: Melida Quitter, MD;  Location: [place];  Service: ENT;  Laterality: N/A;  ? ? ? ?Current Outpatient Medications  ?Medication Sig Dispense Refill  ? acetaminophen (TYLENOL) 500 MG tablet Take 1,000 mg by mouth daily as needed for moderate pain.    ? alfuzosin (UROXATRAL) 10 MG 24 hr tablet Take 10 mg by mouth at bedtime.     ? apixaban (ELIQUIS) 5 MG TABS tablet Take 1 tablet (5 mg total) by mouth 2 (two) times daily. 60 tablet 3  ? buPROPion (WELLBUTRIN XL) 150 MG 24 hr tablet Take 1 tablet (150 mg total) by mouth daily. 90 tablet 3  ? Cholecalciferol (VITAMIN D) 2000 units tablet Take 2,000 Units by mouth at bedtime.     ? ezetimibe (ZETIA) 10 MG tablet Take 1 tablet (10 mg total) by mouth  every morning. 90 tablet 2  ? finasteride (PROSCAR) 5 MG tablet Take 5 mg by mouth daily with supper.    ? levothyroxine (SYNTHROID) 175 MCG tablet Take 1 tablet (175 mcg total) by mouth daily.    ? lidocaine-prilocaine (EMLA) cream Apply to affected area once before sleep time- 30 g 3  ? metoprolol succinate (TOPROL-XL) 25 MG 24 hr tablet Take 12.5 mg by mouth daily.    ? Polyethyl Glycol-Propyl Glycol (SYSTANE HYDRATION PF OP) Place 1 drop into both eyes 3 (three) times daily.    ? ?No current facility-administered medications for this visit.  ? ? ?Allergies:   Levaquin [levofloxacin in d5w], Nickel, Other, Pravastatin, Simvastatin, Statins, Allopurinol, Ampicillin, and Penicillin g  ? ?Social History:  The patient  reports that he has never smoked. He has never used smokeless tobacco.  He reports that he does not currently use alcohol. He reports that he does not use drugs.  ? ?Family History:  The patient's family history includes Cancer in his maternal grandmother; Heart attack in his maternal grandfather, paternal grandfather, and paternal grandmother; Heart failure (age of onset: 25) in his father; Parkinson's disease (age of onset: 26) in his mother.  ? ? ?ROS:  Please see the history of present illness.   Otherwise, review of systems is positive for none.   All other systems are reviewed and negative.  ? ? ?PHYSICAL EXAM: ?VS:  BP 122/68   Pulse 73   Ht '6\' 1"'$  (1.854 m)   Wt 219 lb (99.3 kg)   BMI 28.89 kg/m?  , BMI Body mass index is 28.89 kg/m?. ?GEN: Well nourished, well developed, in no acute distress  ?HEENT: normal  ?Neck: no JVD, carotid bruits, or masses ?Cardiac: RRR; no murmurs, rubs, or gallops,no edema  ?Respiratory:  clear to auscultation bilaterally, normal work of breathing ?GI: soft, nontender, nondistended, + BS ?MS: no deformity or atrophy  ?Skin: warm and dry, device pocket is well healed ?Neuro:  Strength and sensation are intact ?Psych: euthymic mood, full affect ? ?EKG:  EKG is  ordered today. ?Personal review of the ekg ordered shows sinus rhythm ? ?Device interrogation is reviewed today in detail.  See PaceArt for details. ? ? ?Recent Labs: ?04/03/2021: ALT 17 ?05/27/2021: B Natriuretic Pepti

## 2021-07-06 ENCOUNTER — Encounter (HOSPITAL_BASED_OUTPATIENT_CLINIC_OR_DEPARTMENT_OTHER): Payer: Self-pay

## 2021-07-06 ENCOUNTER — Encounter (HOSPITAL_BASED_OUTPATIENT_CLINIC_OR_DEPARTMENT_OTHER): Payer: Medicare Other | Admitting: Internal Medicine

## 2021-07-09 ENCOUNTER — Ambulatory Visit (INDEPENDENT_AMBULATORY_CARE_PROVIDER_SITE_OTHER): Payer: Medicare Other

## 2021-07-09 DIAGNOSIS — I480 Paroxysmal atrial fibrillation: Secondary | ICD-10-CM | POA: Diagnosis not present

## 2021-07-09 LAB — CUP PACEART REMOTE DEVICE CHECK
Date Time Interrogation Session: 20230415185601
Implantable Pulse Generator Implant Date: 20230105

## 2021-07-09 NOTE — Progress Notes (Signed)
Patient ID: Sean Stanley                 DOB: 01/19/1941                    MRN: [id number] ? ? ? ? ?HPI: ?Sean Stanley is a 81 y.o. male patient referred to lipid clinic by Dr. Johney Frame. PMH is significant for ruptured abdominal aortic aneurysm in 2014, renal artery aneurysm, atrial fibrillation/flutter (ablation in 2015 and two cardioversions in 05/2021), hyperlipidemia, obstructive sleep apnea, thoracic aortic aneurysm, minimally invasive mitral valve repair in 2002. CT chest on 04/10/21 showed aortic atherosclerosis and 3v coronary artery calcifications. Pt saw Dr. Johney Frame on 06/14/21 and said he has tried pravastatin in the past but stopped d/t experiencing myalgias. She discussed PCSK9 inhibitors with him and was referred to lipid clinic. ? ?Pt presents today for lipid clinic appointment with his wife. Pt reports med compliance with ezetimibe and is tolerating it well. Pt and his wife asked about side effects of PCSK9i and if they would interfere with any of his current medications. Pt's wife asked if he could take a statin sometimes (I.e., one week on, one week off, etc) and still get benefit vs starting a PCSK9i. ? ?Current Medications: ezetimibe 10 mg daily ?Intolerances: statins (pravastatin, simvastatin, rosuvastatin , atorvastatin) - myalgias ?Risk Factors: ASCVD, OSA, family hx of ASCVD (grandparents) ? ?LDL goal: <70 mg/dL ? ?Labs (02/13/21): TC 210, TG 100, HDL 65, LDL 127 (ezetimibe '10mg'$  daily) ? ?Diet: breakfast - eggs, 2 slices of toast with peanut butter; lunch - ham sandwich; dinner - varied entrees - salmon, chicken, beef with salads; 3x per week - dessert. Try to adhere to a low sodium diet ? ?Exercise: 3x per week - aqua exercise; used to walk a lot more ? ?Family History: Father - HF. Paternal grandmother and grandfather - MI. Maternal grandfather - MI ? ?Social History: Denies tobacco, alcohol, and illicit drug use ? ?Past Medical History:  ?Diagnosis Date  ? Abdominal aortic aneurysm,  ruptured ([place]) 2014  ? 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 followed at [facility]  ? 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 being [facility] at [facility]  ? 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 s/p coiling 12/16/12 - [facility]  ? Atrial flutter ([place])   ? BPH (benign prostatic hyperplasia)   ? Cancer [facility])   ? melanoma on lower right back and left chest - surgically removed and cleared  ? Difficult intubation   ? Dysrhythmia   ? H/O agent Orange exposure   ? Headache   ? migraine- not current  ? High bilirubin   ? 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 History of blood transfusion   ? Hypercholesteremia   ? Hypercholesterolemia   ? Lymphoma ([place]) 04/2018  ? Mitral valve disease   ? annuloplasty 2002 [facility]  ? Neuropathy   ? Neuropathy of both feet   ? 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 OSA (obstructive sleep apnea)   ? does not use cpap, Dr. Maxwell Caul told him he had improved  ? Pneumonia   ? Thoracic ascending aortic aneurysm (HCC)   ? 4.5 cm 03/2018 CT  ? Thyroid cancer ([place])   ? ? ?Current Outpatient Medications on File  Prior to Visit  ?Medication Sig Dispense Refill  ? acetaminophen (TYLENOL) 500 MG tablet Take 1,000 mg by mouth daily as needed for moderate pain.    ? alfuzosin (UROXATRAL) 10 MG 24 hr tablet Take 10 mg by mouth at bedtime.     ? apixaban (ELIQUIS) 5 MG TABS tablet Take 1 tablet (5 mg total) by mouth 2 (two) times daily. 60 tablet 3  ? buPROPion (WELLBUTRIN XL) 150 MG 24 hr tablet Take 1 tablet (150 mg total) by mouth daily. 90 tablet 3  ? Cholecalciferol (VITAMIN D) 2000 units tablet Take 2,000 Units by mouth at bedtime.     ? ezetimibe (ZETIA) 10 MG tablet Take 1 tablet (10 mg total) by mouth every morning. 90 tablet 2  ? finasteride (PROSCAR) 5 MG tablet Take 5 mg by mouth daily with supper.    ? levothyroxine  (SYNTHROID) 175 MCG tablet Take 1 tablet (175 mcg total) by mouth daily.    ? lidocaine-prilocaine (EMLA) cream Apply to affected area once before sleep time- 30 g 3  ? metoprolol succinate (TOPROL-XL) 25 MG 24 hr tablet Take 12.5 mg by mouth daily.    ? Polyethyl Glycol-Propyl Glycol (SYSTANE HYDRATION PF OP) Place 1 drop into both eyes 3 (three) times daily.    ? ?No current facility-administered medications on file prior to visit.  ? ?Allergies  ?Allergen Reactions  ? Levaquin [Levofloxacin In D5w]   ?  tendonitis  ? Nickel Itching  ? Other   ?  No blood thinners due to history of several aneurysms   ? Pravastatin   ?  Muscle aches  ? Simvastatin   ?  Chest pain  ? Statins Other (See Comments)  ?  Muscle aches  ? Allopurinol Rash  ? Ampicillin Rash  ?  Did it involve swelling of the face/tongue/throat, SOB, or low BP? No ?Did it involve sudden or severe rash/hives, skin peeling, or any reaction on the inside of your mouth or nose? Yes ?Did you need to seek medical attention at a hospital or doctor's office? Yes ?When did it last happen?      50+ years ?If all above answers are ?NO?, may proceed with cephalosporin use. ?  ? Penicillin G Rash  ? ?Assessment/Plan: ? ?1. Hyperlipidemia - LDL 127 above goal < 70 mg/dL. Continue ezetimibe 10 mg daily. Discussed safety and efficacy profile of PCSK9i. Showed pt how to use the pen. He prefers to retrial rosuvastatin 5 mg daily on MWF prior to starting [place]. Will call pt in one month to see how he is tolerating the medication and will schedule labs then if tolerating rosuvastatin. Gave pt clinic number to call if he experiences any side effects before 1 month. If he is unable to tolerate rosuvastatin, will revisit PCSK9i discussion. ? ?Patient seen with Debria Garret, [place] pharmacy student ? ?Rebbeca Paul, PharmD ?PGY2 Resident ? ?Ramond Dial, Pharm.D, BCPS, CPP ?[id number], [place], [place] [address]  ?Phone: [phone];  Fax: [phone]  ? ? ?

## 2021-07-10 ENCOUNTER — Ambulatory Visit (INDEPENDENT_AMBULATORY_CARE_PROVIDER_SITE_OTHER): Payer: Medicare Other | Admitting: Student-PharmD

## 2021-07-10 DIAGNOSIS — E785 Hyperlipidemia, unspecified: Secondary | ICD-10-CM

## 2021-07-10 MED ORDER — ROSUVASTATIN CALCIUM 5 MG PO TABS: 5.0000 mg | ORAL_TABLET | ORAL | 3 refills | Status: DC

## 2021-07-10 NOTE — Patient Instructions (Addendum)
Your LDL goal is < 70  ? ?Continue ezetimibe 10 mg daily. Start rosuvastatin (Crestor) 5 mg three times weekly.  ? ?Will call in one month to see how you are tolerating the medication and will schedule labs then if needed. ? ?If you have any issues before then please give [place] a call [phone].  ?

## 2021-07-25 NOTE — Progress Notes (Signed)
[facility] Summary Report / Loop Recorder 

## 2021-08-01 DIAGNOSIS — U071 COVID-19: Secondary | ICD-10-CM | POA: Diagnosis not present

## 2021-08-07 ENCOUNTER — Telehealth: Payer: Self-pay | Admitting: Pharmacist

## 2021-08-07 DIAGNOSIS — E785 Hyperlipidemia, unspecified: Secondary | ICD-10-CM

## 2021-08-07 MED ORDER — ROSUVASTATIN CALCIUM 5 MG PO TABS: 5.0000 mg | ORAL_TABLET | ORAL | 3 refills | Status: DC

## 2021-08-07 NOTE — Telephone Encounter (Signed)
Pt's wife called clinic. Reports pt is tolerating low dose rosuvastatin well but that [facility] advised them they cannot order any rosu[facility]atin currently. I am not aware of any shortages of rosuvastatin. She asks for rx to be sent in to [facility] instead, rx has been sent. ?

## 2021-08-10 LAB — CUP PACEART REMOTE DEVICE CHECK
Date Time Interrogation Session: 20230518185552
Implantable Pulse Generator Implant Date: 20230105

## 2021-08-13 ENCOUNTER — Ambulatory Visit (INDEPENDENT_AMBULATORY_CARE_PROVIDER_SITE_OTHER): Payer: Medicare Other

## 2021-08-13 DIAGNOSIS — I480 Paroxysmal atrial fibrillation: Secondary | ICD-10-CM | POA: Diagnosis not present

## 2021-08-14 NOTE — Telephone Encounter (Signed)
Called patient to schedule fasting lab work after starting rosuvastatin. He is still tolerating 5 mg on MWF. Last week his wife called reporting issues getting [facility] but said that it ended up being available the next day so he has not run out. Scheduled lab appt for 09/03/21. Patient is aware these labs are fasting.

## 2021-08-14 NOTE — Addendum Note (Signed)
Addended by: Rebbeca Paul B on: 08/14/2021 09:05 AM   Modules accepted: Orders

## 2021-08-22 DIAGNOSIS — I4891 Unspecified atrial fibrillation: Secondary | ICD-10-CM | POA: Diagnosis not present

## 2021-08-22 DIAGNOSIS — N400 Enlarged prostate without lower urinary tract symptoms: Secondary | ICD-10-CM | POA: Diagnosis not present

## 2021-08-22 DIAGNOSIS — E7800 Pure hypercholesterolemia, unspecified: Secondary | ICD-10-CM | POA: Diagnosis not present

## 2021-08-22 DIAGNOSIS — C73 Malignant neoplasm of thyroid gland: Secondary | ICD-10-CM | POA: Diagnosis not present

## 2021-08-30 NOTE — Progress Notes (Signed)
[facility] Summary Report / Loop Recorder 

## 2021-09-03 ENCOUNTER — Other Ambulatory Visit: Payer: Medicare Other

## 2021-09-03 ENCOUNTER — Other Ambulatory Visit: Payer: Self-pay

## 2021-09-03 DIAGNOSIS — E785 Hyperlipidemia, unspecified: Secondary | ICD-10-CM | POA: Diagnosis not present

## 2021-09-03 LAB — LIPID PANEL
Chol/HDL Ratio: 2.6 ratio (ref 0.0–5.0)
Cholesterol, Total: 156 mg/dL (ref 100–199)
HDL: 61 mg/dL (ref >39)
LDL Chol Calc (NIH): 78 mg/dL (ref 0–99)
Triglycerides: 92 mg/dL (ref 0–149)
VLDL Cholesterol Cal: 17 mg/dL (ref 5–40)

## 2021-09-03 LAB — HEPATIC FUNCTION PANEL
ALT: 23 IU/L (ref 0–44)
AST: 21 IU/L (ref 0–40)
Albumin: 4.3 g/dL (ref 3.7–4.7)
Alkaline Phosphatase: 67 IU/L (ref 44–121)
Bilirubin Total: 0.8 mg/dL (ref 0.0–1.2)
Bilirubin, Direct: 0.22 mg/dL (ref 0.00–0.40)
Total Protein: 6 g/dL (ref 6.0–8.5)

## 2021-09-03 MED ORDER — ROSUVASTATIN CALCIUM 5 MG PO TABS: 5.0000 mg | ORAL_TABLET | ORAL | 3 refills | Status: DC

## 2021-09-04 ENCOUNTER — Telehealth: Payer: Self-pay | Admitting: Student-PharmD

## 2021-09-04 NOTE — Telephone Encounter (Signed)
Patient and his wife returned call. Discussed trying rosuvastatin 5 mg daily but patient prefers to stay on 5 mg MWF since this is the first statin dose he has tolerated. He would like to work more on his diet and exercise and recheck his labs at his next visit with Dr. Johney Frame in September before making further changes which is appropriate.

## 2021-09-04 NOTE — Telephone Encounter (Signed)
LDL has improved to 78 since starting rosuvastatin 5 mg on MWF though is not quite at goal <70 mg/dL. He is intolerant of multiple statins (pravastatin, simvastatin, rosuvastatin, and atorvastatin) due to myalgias but has been able to tolerate thrice weekly low dose rosuvastatin. Continue rosuvastatin 5 mg MWF and ezetimibe 10 mg daily. He was previously resistant to addition of PCSK9i. Could consider revisiting this discussion vs continuing current management. Called patient to review results but was unable to reach, LVM.

## 2021-09-06 ENCOUNTER — Other Ambulatory Visit: Payer: Self-pay | Admitting: Pharmacist

## 2021-09-06 MED ORDER — ROSUVASTATIN CALCIUM 5 MG PO TABS: 5.0000 mg | ORAL_TABLET | ORAL | 3 refills | Status: DC

## 2021-09-17 ENCOUNTER — Ambulatory Visit (INDEPENDENT_AMBULATORY_CARE_PROVIDER_SITE_OTHER): Payer: Medicare Other

## 2021-09-17 DIAGNOSIS — I480 Paroxysmal atrial fibrillation: Secondary | ICD-10-CM | POA: Diagnosis not present

## 2021-09-18 LAB — CUP PACEART REMOTE DEVICE CHECK
Date Time Interrogation Session: 20230620185337
Implantable Pulse Generator Implant Date: 20230105

## 2021-09-20 DIAGNOSIS — L814 Other melanin hyperpigmentation: Secondary | ICD-10-CM | POA: Diagnosis not present

## 2021-09-20 DIAGNOSIS — D485 Neoplasm of uncertain behavior of skin: Secondary | ICD-10-CM | POA: Diagnosis not present

## 2021-09-20 DIAGNOSIS — D1801 Hemangioma of skin and subcutaneous tissue: Secondary | ICD-10-CM | POA: Diagnosis not present

## 2021-09-20 DIAGNOSIS — S40861D Insect bite (nonvenomous) of right upper arm, subsequent encounter: Secondary | ICD-10-CM | POA: Diagnosis not present

## 2021-10-09 ENCOUNTER — Other Ambulatory Visit: Payer: Self-pay

## 2021-10-09 ENCOUNTER — Encounter: Payer: Self-pay | Admitting: Hematology and Oncology

## 2021-10-09 ENCOUNTER — Inpatient Hospital Stay: Payer: Medicare Other | Attending: Hematology and Oncology

## 2021-10-09 ENCOUNTER — Inpatient Hospital Stay (HOSPITAL_BASED_OUTPATIENT_CLINIC_OR_DEPARTMENT_OTHER): Payer: Medicare Other | Admitting: Hematology and Oncology

## 2021-10-09 DIAGNOSIS — Z79899 Other long term (current) drug therapy: Secondary | ICD-10-CM | POA: Diagnosis not present

## 2021-10-09 DIAGNOSIS — C73 Malignant neoplasm of thyroid gland: Secondary | ICD-10-CM | POA: Diagnosis not present

## 2021-10-09 DIAGNOSIS — Z85850 Personal history of malignant neoplasm of thyroid: Secondary | ICD-10-CM | POA: Diagnosis not present

## 2021-10-09 DIAGNOSIS — C8318 Mantle cell lymphoma, lymph nodes of multiple sites: Secondary | ICD-10-CM

## 2021-10-09 DIAGNOSIS — Z8572 Personal history of non-Hodgkin lymphomas: Secondary | ICD-10-CM | POA: Insufficient documentation

## 2021-10-09 DIAGNOSIS — M542 Cervicalgia: Secondary | ICD-10-CM | POA: Diagnosis not present

## 2021-10-09 LAB — CBC WITH DIFFERENTIAL (CANCER CENTER ONLY)
Abs Immature Granulocytes: 0.03 10*3/uL (ref 0.00–0.07)
Basophils Absolute: 0 10*3/uL (ref 0.0–0.1)
Basophils Relative: 1 %
Eosinophils Absolute: 0.1 10*3/uL (ref 0.0–0.5)
Eosinophils Relative: 2 %
HCT: 38.5 % — ABNORMAL LOW (ref 39.0–52.0)
Hemoglobin: 13.1 g/dL (ref 13.0–17.0)
Immature Granulocytes: 1 %
Lymphocytes Relative: 13 %
Lymphs Abs: 0.8 10*3/uL (ref 0.7–4.0)
MCH: 30.8 pg (ref 26.0–34.0)
MCHC: 34 g/dL (ref 30.0–36.0)
MCV: 90.6 fL (ref 80.0–100.0)
Monocytes Absolute: 0.7 10*3/uL (ref 0.1–1.0)
Monocytes Relative: 11 %
Neutro Abs: 4.6 10*3/uL (ref 1.7–7.7)
Neutrophils Relative %: 72 %
Platelet Count: 172 10*3/uL (ref 150–400)
RBC: 4.25 MIL/uL (ref 4.22–5.81)
RDW: 13.3 % (ref 11.5–15.5)
WBC Count: 6.4 10*3/uL (ref 4.0–10.5)
nRBC: 0 % (ref 0.0–0.2)

## 2021-10-09 LAB — CMP (CANCER CENTER ONLY)
ALT: 30 U/L (ref 0–44)
AST: 29 U/L (ref 15–41)
Albumin: 4.3 g/dL (ref 3.5–5.0)
Alkaline Phosphatase: 56 U/L (ref 38–126)
Anion gap: 6 (ref 5–15)
BUN: 26 mg/dL — ABNORMAL HIGH (ref 8–23)
CO2: 28 mmol/L (ref 22–32)
Calcium: 9.1 mg/dL (ref 8.9–10.3)
Chloride: 107 mmol/L (ref 98–111)
Creatinine: 1.26 mg/dL — ABNORMAL HIGH (ref 0.61–1.24)
GFR, Estimated: 58 mL/min — ABNORMAL LOW (ref >60)
Glucose, Bld: 87 mg/dL (ref 70–99)
Potassium: 4.1 mmol/L (ref 3.5–5.1)
Sodium: 141 mmol/L (ref 135–145)
Total Bilirubin: 1.5 mg/dL — ABNORMAL HIGH (ref 0.3–1.2)
Total Protein: 6.3 g/dL — ABNORMAL LOW (ref 6.5–8.1)

## 2021-10-09 NOTE — Assessment & Plan Note (Signed)
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

## 2021-10-09 NOTE — Assessment & Plan Note (Signed)
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

## 2021-10-09 NOTE — Progress Notes (Signed)
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   All questions were answered. The patient knows to call the clinic with any problems, questions or concerns. The total time spent in the appointment was 20 minutes encounter with patients including review of chart and various tests results, discussions about plan of care and coordination of care plan   Heath Lark, MD 10/09/2021 12:46 PM  INTERVAL HISTORY: Please see below for problem oriented charting. he returns for surveillance follow-up for history of mantle cell lymphoma and thyroid cancer He complains of slight discomfort in his neck intermittently No new lymphadenopathy  REVIEW OF SYSTEMS:   Constitutional: Denies fevers, chills or abnormal weight loss Eyes: Denies blurriness of vision Ears, nose, mouth, throat, and face: Denies mucositis or sore throat Respiratory: Denies cough, dyspnea or wheezes Cardiovascular: Denies palpitation, chest discomfort or lower extremity swelling Gastrointestinal:  Denies nausea, heartburn or change in bowel habits Skin: Denies abnormal skin rashes Lymphatics: Denies new lymphadenopathy or easy  bruising Neurological:Denies numbness, tingling or new weaknesses Behavioral/Psych: Mood is stable, no new changes  All other systems were reviewed with the patient and are negative.  I have reviewed the past medical history, past surgical history, social history and family history with the patient and they are unchanged from previous note.  ALLERGIES:  is allergic to levaquin [levofloxacin in d5w], nickel, other, pravastatin, simvastatin, statins, allopurinol, ampicillin, and penicillin g.  MEDICATIONS:  Current Outpatient Medications  Medication Sig Dispense Refill   acetaminophen (TYLENOL) 500 MG tablet Take 1,000 mg by mouth daily as needed for moderate pain.     alfuzosin (UROXATRAL) 10 MG 24 hr tablet Take 10 mg by mouth at bedtime.      apixaban (ELIQUIS) 5 MG TABS tablet Take 1 tablet (5 mg total) by mouth 2 (two) times daily. 60 tablet 3   buPROPion (WELLBUTRIN XL) 150 MG 24 hr tablet Take 1 tablet (150 mg total) by mouth daily. 90 tablet 3   Cholecalciferol (VITAMIN D) 2000 units tablet Take 2,000 Units by mouth at bedtime.      ezetimibe (ZETIA) 10 MG tablet Take 1 tablet (10 mg total) by mouth every morning. 90 tablet 2   finasteride (PROSCAR) 5 MG tablet Take 5 mg by mouth daily with supper.     levothyroxine (SYNTHROID) 175 MCG tablet Take 1 tablet (175 mcg total) by mouth daily.     lidocaine-prilocaine (EMLA) cream Apply to affected area once before sleep time- 30 g 3   metoprolol succinate (TOPROL-XL) 25 MG 24 hr tablet Take 12.5 mg by mouth daily.     Polyethyl Glycol-Propyl Glycol (SYSTANE HYDRATION PF OP) Place 1 drop into both eyes 3 (three) times daily.  rosuvastatin (CRESTOR) 5 MG tablet Take 1 tablet (5 mg total) by mouth every Monday, Wednesday, and Friday. 36 tablet 3   No current facility-administered medications for this visit.    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 2. 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 3. Slight reduction in size of right retroperitoneal evolving hematoma.   03/30/2018 Imaging   Ct neck 1. Extensive adenopathy on the left. The largest node is a level 1/submandibular node measuring 38 x 24 x 22 mm. Largest level 2 node measures 3.2 x 2 x 2 cm. Numerous other smaller but round lymph nodes throughout the left neck in the level 2 through level 4 region. The largest level 5 node measures 3.4 x 3.4 x 2.3 cm with a transverse diameter of 2.3 cm. This contains some internal calcifications. There are numerous other pathologic nodes in the left supraclavicular to axillary region. This pattern of disease could be due to extensive left neck metastatic disease from unknown primary, lymphoma, or other systemic malignancy. 2. No evidence of mucosal or submucosal lesion. 3. Diameter of the ascending aorta is 4.5 cm. Recommend annual imaging followup by CTA or MRA. Aortic Atherosclerosis (ICD10-I70.0).     04/10/2018 Pathology Results   Left zone 1 neck mass, Fine Needle Aspiration I (smears and cell block):      Atypical lymphoid proliferation. See comment.       Specimen Adequacy:  Satisfactory for evaluation.  COMMENT:The aspirate demonstrates abundant small to medium sized lymphocytes with scattered epithelioid cells in the background which may be histiocytes.  The smears are cellular, but the cell block includes scant lymphocytes.  Immunohistochemical stains were attempted showing positive staining for CD20 with rare staining for CD3.  Cytokeratin AE1/AE3 is negative. S100 shows high nonspecific background staining but no specifically diagnostic cellular staining.  Overall, the findings  indicate an atypical lymphoid proliferation but are too limited for definitive diagnosis.  Excisional biopsy with flow cytometry is recommended.   04/10/2018 Procedure   He was ENT who performed FNA of neck LN   04/27/2018 Pathology Results   Lymph node for lymphoma, [name], [name], [name], [name], [name], [name], [name], the features are consistent with a mantle cell lymphoma   05/13/2018 PET scan   1. Hypermetabolic left cervical lymphadenopathy, left axillary lymphadenopathy, mediastinal / right hilar lymphadenopathy, right external iliac lymphadenopathy, and subcentimeter left external iliac and left inguinal lymph nodes, [name], [name], [name], without focal lesions, that may represent lymphomatous involvement of the spleen.   05/21/2018 Cancer Staging  Staging form: Hodgkin and Non-Hodgkin Lymphoma, AJCC 8th Edition - Clinical: Stage III - Signed by Heath Lark, MD on 05/21/2018   05/26/2018 Procedure   Ultrasound and fluoroscopically guided right internal jugular single lumen power port catheter insertion. Tip in the SVC/RA junction. Catheter ready for use.     06/01/2018 -  Chemotherapy   The patient had Bendamustine and Rituximab for  chemotherapy treatment   08/24/2018 PET scan   1. Partial response to therapy with complete resolution of size and metabolic activity of the dominant LEFT submandibular node (level 1) and LEFT axillary nodes. 2. Persistent and increased metabolic activity of left level 3 lymph nodes ( [place] 4). Nodes are partially calcified and similar size. 3. No new metastatic disease. 4. Normal spleen and bone marrow.   11/09/2018 PET scan   IMPRESSION: 1. Mild response to therapy of hypermetabolic left cervical nodes. ([place]) 4. 2. No new or progressive disease. 3. Coronary artery atherosclerosis. Aortic Atherosclerosis (ICD10-I70.0). 4. Trace cul-de-sac fluid, similar.   12/18/2018 Imaging   1. 4 mm solid nodule in the right lower lobe, which is technically indeterminate. Recommend attention on follow-up per clinical protocol. 2. Small calcified nodule adjacent to the left lobe of the thyroid, which may represent thyroid nodule or calcified lymph node. Recommend correlation with thyroid ultrasound. 3. The ascending aorta appears mildly dilated, measuring up to 4.2cm. 4. Decreased left axillary lymphadenopathy.   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   Stranding with postsurgical changes along the right anterior neck anterior to the thyroid cartilage, favored to be related to recent surgery. Attention on follow-up is suggested.   No suspicious lymphadenopathy in the neck, chest, abdomen, or pelvis. Deauville criteria 1   11/22/2019 Imaging   1. No evidence of recurrent lymphoma in the chest, abdomen or pelvis. Normal size spleen. 2. 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 Recommend annual imaging followup by CTA or MRA. This recommendation follows 2010 [facility]/AHA/AATS/ACR/ASA/SCA/SCAI/SIR/STS/SVM Guidelines for the Diagnosis and Management of Patients with Thoracic Aortic  Disease. Circulation. 2010; 121: T701-X793. Aortic aneurysm NOS (ICD10-I71.9). Moderate diffuse colonic diverticulosis. Mild prostatomegaly. Aortic Atherosclerosis (ICD10-I70.0).   05/30/2020 Imaging   Stable exam. No evidence of recurrent lymphoma or metastatic disease within the chest, abdomen, or pelvis.   Colonic diverticulosis, without radiographic evidence of diverticulitis.   Tiny nonobstructing left renal calculus.   Stable mildly enlarged prostate.   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   Aortic and coronary atherosclerotic calcification.   02/05/2021 Procedure   Removal of implanted Port-A-Cath utilizing sharp and blunt dissection. The procedure was uncomplicated.   04/11/2021 Imaging   1. No evidence of mass or lymphadenopathy in the chest, abdomen, or pelvis. 2. Normal spleen. 3. Unchanged enlargement of the tubular ascending thoracic aorta, measuring up to 4.4 x 4.4 cm.  4. Cardiomegaly and coronary artery disease.     Thyroid cancer ([place])  11/23/2018 Pathology Results   Lymph node for lymphoma, Left zone 3 - PAPILLARY THYROID CARCINOMA. - SEE MICROSCOPIC DESCRIPTION. Microscopic Comment The specimen consists mostly of encapsulated papillary thyroid carcinoma. There are portions of lymphoid tissue attached to the periphery of the specimen and in the adjacent adipose tissue which suggests that this may represent papillary carcinoma metastatic to a lymph node.   11/23/2018 Surgery   PREOPERATIVE DIAGNOSIS:  Mantle cell lymphoma and cervical lymphadenopathy.   POSTOPERATIVE DIAGNOSIS:  Mantle cell lymphoma and  cervical lymphadenopathy.   PROCEDURE:  Excisional biopsy of left cervical lymph nodes.     01/15/2019 Pathology Results   A. SOFT TISSUE, NECK, ADJACENT TO LEFT RECURRENT NERVE, BIOPSY: - Papillary thyroid carcinoma. B. 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 C.  ADDITIONAL LEFT LEVEL 4 TISSUE, EXCISION: - There is no evidence of carcinoma in 2 of 2 lymph nodes (0/2). D. TOTAL THYROIDECTOMY: - Papillary thyroid carcinoma, 0.8 cm. - Carcinoma is broadly present at an inked tissue edge. - See oncology table below. E. LEFT LEVEL 6 TISSUE, EXCISION: - Benign fibroadipose tissue. - There is no evidence of malignancy. F. LEFT RECURRENT NERVE AND SURROUNDING TISSUE, EXCISION: - Metastatic papillary thyroid carcinoma in 3 of 3 lymph nodes (3/3).  THYROID GLAND: Procedure: Thyroidectomy Tumor Focality: Unifocal Tumor Site: Left lobe Tumor Size: 0.8 cm Histologic Type: 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Margins: Carcinoma is broadly present at a black inked tissue edge. 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   PROCEDURE:  Total thyroidectomy and left modified radical neck dissection.   SURGEON:  Melida Quitter, MD     FINDINGS:  There was a firm nodule in zone IIB on the left side and also some firm tissue and adherence to the jugular vein in zone III region requiring removal of a portion of the vein.  There was a firm nodule in the left thyroid lobe that extended posterior to the gland, adhering to the trachea, and encompassing the recurrent laryngeal nerve.  Tissue was sent for frozen section from adjacent to the nerve, demonstrating papillary carcinoma.  The nerve  stimulated distal to the mass but not proximal to the mass; thus, the nerve was removed with the mass.  The left-sided parathyroid glands were visualized, as was the right superior parathyroid gland.  The right recurrent nerve was kept safe and stimulated well at the end of the case.   05/30/2020 Imaging   Negative for recurrent mass or adenopathy in the neck   Left vocal cord paresis.   04/03/2021 Cancer Staging   Staging form: Thyroid - Differentiated, AJCC 8th Edition - Pathologic stage from 04/03/2021: pT1a, pN1, cM0 - Signed by Heath Lark, MD on 04/03/2021 Lymph node metastasis: Present   04/11/2021 Imaging   No change from the prior study.  No recurrent mass or adenopathy   Postop changes left neck, stable   Chronic paresis left vocal cord unchanged.     PHYSICAL EXAMINATION: ECOG PERFORMANCE STATUS: 1 - Symptomatic but completely ambulatory  Vitals:   10/09/21 1219  BP: 129/66  Pulse: 72  Resp: 18  Temp: 97.8 F (36.6 C)  SpO2: 98%   Filed Weights   10/09/21 1219  Weight: 220 lb 3.2 oz (99.9 kg)    GENERAL:alert, no distress and comfortable SKIN: skin color, texture, turgor are normal, no rashes or significant lesions EYES: normal, Conjunctiva are pink and non-injected, sclera clear OROPHARYNX:no exudate, no erythema and lips, buccal mucosa,  and tongue normal  NECK: Noted well-healed surgical scar.  No other palpable abnormalities. LYMPH:  no palpable lymphadenopathy in the cervical, axillary or inguinal LUNGS: clear to auscultation and percussion with normal breathing effort HEART: regular rate & rhythm and no murmurs and no lower extremity edema ABDOMEN:abdomen soft, non-tender and normal bowel sounds Musculoskeletal:no cyanosis of digits and no clubbing.  Noted slight stooped posture NEURO: alert & oriented x 3 with fluent speech, no focal motor/sensory deficits  LABORATORY DATA:  I have reviewed the data as listed    Component Value Date/Time   NA 141  10/09/2021 1205   K 4.1 10/09/2021 1205   CL 107 10/09/2021 1205   CO2 28 10/09/2021 1205   GLUCOSE 87 10/09/2021 1205   BUN 26 (H) 10/09/2021 1205   CREATININE 1.26 (H) 10/09/2021 1205   CALCIUM 9.1 10/09/2021 1205   PROT 6.3 (L) 10/09/2021 1205   PROT 6.0 09/03/2021 0749   ALBUMIN 4.3 10/09/2021 1205   ALBUMIN 4.3 09/03/2021 0749   AST 29 10/09/2021 1205   ALT 30 10/09/2021 1205   ALKPHOS 56 10/09/2021 1205   BILITOT 1.5 (H) 10/09/2021 1205   GFRNONAA 58 (L) 10/09/2021 1205   GFRAA >60 11/22/2019 1023    No results found for: "SPEP", "UPEP"  Lab Results  Component Value Date   WBC 6.4 10/09/2021   NEUTROABS 4.6 10/09/2021   HGB 13.1 10/09/2021   HCT 38.5 (L) 10/09/2021   MCV 90.6 10/09/2021   PLT 172 10/09/2021      Chemistry      Component Value Date/Time   NA 141 10/09/2021 1205   K 4.1 10/09/2021 1205   CL 107 10/09/2021 1205   CO2 28 10/09/2021 1205   BUN 26 (H) 10/09/2021 1205   CREATININE 1.26 (H) 10/09/2021 1205      Component Value Date/Time   CALCIUM 9.1 10/09/2021 1205   ALKPHOS 56 10/09/2021 1205   AST 29 10/09/2021 1205   ALT 30 10/09/2021 1205   BILITOT 1.5 (H) 10/09/2021 1205

## 2021-10-09 NOTE — Assessment & Plan Note (Signed)
There is no signs of lymphoma recurrence I will see him again in 12 months for further follow-up He does not need routine surveillance imaging study in the absence of symptoms

## 2021-10-11 NOTE — Progress Notes (Signed)
[facility] Summary Report / Loop Recorder 

## 2021-10-18 LAB — CUP PACEART REMOTE DEVICE CHECK
Date Time Interrogation Session: 20230723185301
Implantable Pulse Generator Implant Date: 20230105

## 2021-10-21 ENCOUNTER — Encounter: Payer: Self-pay | Admitting: Hematology and Oncology

## 2021-10-22 ENCOUNTER — Ambulatory Visit (INDEPENDENT_AMBULATORY_CARE_PROVIDER_SITE_OTHER): Payer: Medicare Other

## 2021-10-22 ENCOUNTER — Telehealth: Payer: Self-pay | Admitting: Hematology and Oncology

## 2021-10-22 DIAGNOSIS — I480 Paroxysmal atrial fibrillation: Secondary | ICD-10-CM

## 2021-10-22 NOTE — Telephone Encounter (Signed)
Scheduled per 7/31 in basket, pt confirmed appt 

## 2021-10-25 ENCOUNTER — Telehealth: Payer: Self-pay | Admitting: Cardiology

## 2021-10-25 NOTE — Telephone Encounter (Signed)
  1. Has your device fired?   Yes  2. Is you device beeping?  No  3. Are you experiencing draining or swelling at device site?  No  4. Are you calling to see if we received your device transmission?   Yes  5. Have you passed out?  No    Please route to [place]   Patient called stating he received a call which said his device is not communicating with the clinic.  Patient stated his cell phone shows that the device is transmitting.

## 2021-10-26 NOTE — Telephone Encounter (Signed)
Pt is coming to the office on Monday for me to help him with the app on his phone.

## 2021-10-29 NOTE — Telephone Encounter (Signed)
I spoke with the patient and his monitor app is now working.

## 2021-11-07 DIAGNOSIS — Z0000 Encounter for general adult medical examination without abnormal findings: Secondary | ICD-10-CM | POA: Diagnosis not present

## 2021-11-07 DIAGNOSIS — I4891 Unspecified atrial fibrillation: Secondary | ICD-10-CM | POA: Diagnosis not present

## 2021-11-07 DIAGNOSIS — C8310 Mantle cell lymphoma, unspecified site: Secondary | ICD-10-CM | POA: Diagnosis not present

## 2021-11-07 DIAGNOSIS — Z1331 Encounter for screening for depression: Secondary | ICD-10-CM | POA: Diagnosis not present

## 2021-11-07 DIAGNOSIS — G4733 Obstructive sleep apnea (adult) (pediatric): Secondary | ICD-10-CM | POA: Diagnosis not present

## 2021-11-07 DIAGNOSIS — E7800 Pure hypercholesterolemia, unspecified: Secondary | ICD-10-CM | POA: Diagnosis not present

## 2021-11-07 DIAGNOSIS — Z85850 Personal history of malignant neoplasm of thyroid: Secondary | ICD-10-CM | POA: Diagnosis not present

## 2021-11-07 DIAGNOSIS — N400 Enlarged prostate without lower urinary tract symptoms: Secondary | ICD-10-CM | POA: Diagnosis not present

## 2021-11-07 DIAGNOSIS — N5201 Erectile dysfunction due to arterial insufficiency: Secondary | ICD-10-CM | POA: Diagnosis not present

## 2021-11-08 ENCOUNTER — Ambulatory Visit (HOSPITAL_COMMUNITY): Payer: Medicare Other | Attending: Cardiology

## 2021-11-08 DIAGNOSIS — I059 Rheumatic mitral valve disease, unspecified: Secondary | ICD-10-CM | POA: Insufficient documentation

## 2021-11-08 DIAGNOSIS — I361 Nonrheumatic tricuspid (valve) insufficiency: Secondary | ICD-10-CM | POA: Diagnosis not present

## 2021-11-08 DIAGNOSIS — Z9889 Other specified postprocedural states: Secondary | ICD-10-CM | POA: Diagnosis not present

## 2021-11-08 LAB — ECHOCARDIOGRAM COMPLETE
Area-P 1/2: 1.76 cm2
S' Lateral: 2.75 cm

## 2021-11-09 ENCOUNTER — Encounter: Payer: Self-pay | Admitting: Cardiology

## 2021-11-15 DIAGNOSIS — D1801 Hemangioma of skin and subcutaneous tissue: Secondary | ICD-10-CM | POA: Diagnosis not present

## 2021-11-15 DIAGNOSIS — L814 Other melanin hyperpigmentation: Secondary | ICD-10-CM | POA: Diagnosis not present

## 2021-11-15 DIAGNOSIS — D225 Melanocytic nevi of trunk: Secondary | ICD-10-CM | POA: Diagnosis not present

## 2021-11-15 DIAGNOSIS — L570 Actinic keratosis: Secondary | ICD-10-CM | POA: Diagnosis not present

## 2021-11-15 DIAGNOSIS — B0089 Other herpesviral infection: Secondary | ICD-10-CM | POA: Diagnosis not present

## 2021-11-15 DIAGNOSIS — D485 Neoplasm of uncertain behavior of skin: Secondary | ICD-10-CM | POA: Diagnosis not present

## 2021-11-22 NOTE — Progress Notes (Signed)
[facility] Summary Report / Loop Recorder 

## 2021-11-26 LAB — CUP PACEART REMOTE DEVICE CHECK
Date Time Interrogation Session: 20230825185024
Implantable Pulse Generator Implant Date: 20230105

## 2021-11-27 ENCOUNTER — Ambulatory Visit (INDEPENDENT_AMBULATORY_CARE_PROVIDER_SITE_OTHER): Payer: Medicare Other

## 2021-11-27 DIAGNOSIS — I480 Paroxysmal atrial fibrillation: Secondary | ICD-10-CM

## 2021-11-29 DIAGNOSIS — D485 Neoplasm of uncertain behavior of skin: Secondary | ICD-10-CM | POA: Diagnosis not present

## 2021-11-29 DIAGNOSIS — L988 Other specified disorders of the skin and subcutaneous tissue: Secondary | ICD-10-CM | POA: Diagnosis not present

## 2021-12-04 ENCOUNTER — Encounter (INDEPENDENT_AMBULATORY_CARE_PROVIDER_SITE_OTHER): Payer: Medicare Other | Admitting: Ophthalmology

## 2021-12-05 NOTE — Progress Notes (Deleted)
Cardiology Office Note:    Date:  12/05/2021   ID:  Sean Stanley, DOB [birth year], MRN [id number]  PCP:  Kathalene Frames, MD   [facility] Providers Cardiologist:  Lolita Patella, MD {   Referring MD: Kathalene Frames, *     History of Present Illness:    Sean Stanley is a 80 y.o. male with a very complicated cardiac and oncology history who was previously followed by Dr. Meda Coffee who now presents to clinic for follow-up.  The patient is a [place] [employer] veteran, he has been followed by Dr. Edwin Dada and cardiology at [facility].  The patient underwent minimally invasive mitral valve repair in October 2002 at [facility] and has been followed there since then.  He was briefly seen at [facility] for episode of atrial flutter followed by cardioversion in 2014.  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 In May 2015 he underwent atrial flutter ablation.   He has also been followed by Dr. 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 He is currently in remission for both of his cancers. He has residual vocal cord dysfunction.  Saw Dr. Meda Coffee on 03/27/20, the patient was stable from CV standpoint. He underwent TTE at [facility] 10/2019 that showed mild left ventricle dysfunction with LVEF of 45 to 50%, global longitudinal strain -12.8%, moderate concentric left ventricular hypertrophy, normal right ventricular systolic function, status post mitral valve repair with prosthetic mitral valve ring in place, with mild residual mitral regurgitation, moderately dilated left atrium, trivial tricuspid regurgitation, normal size of the right atrium and normal right-sided pressures.  Saw Dr. Rayann Heman  03/2021. He requested loop at that time.   Presented to ER in 05/2021 with rapid HR found to be in atypical Aflutter that did not respond to IV metop. Seen in Afib clinic on 05/28/21 and was resumed on apixaban and recommended for DCCV. He had cardioversion on 06/19/21 with return to NSR.   Saw Dr. Curt Bears in 06/2021 where he remained in NSR and recommended for continued medical therapy.  Repeat TTE 10/2021 with LVEF 50-55%, normal RV, severe biatrial enlargement, normal functioning MVR with no MS/MR, mild aortic root and ascending aortic dilation at 71m and 430mrespectively.  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, IR could not access culprit lesion and treated with anticoag reversal; no AAA noted on 06/13/17 CTA   Aneurysm artery, celiac ([facility]   followed at [facility]   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   [facility]ys[facility] spl[facility] artery ([facility]   s/p coiling 12/16/12 - [facility]   Atrial flutter (HCC)    BPH (benign prostatic hyperplasia)    Cancer ([facility]   melanoma on lower right back and left chest - surgically removed and cleared   Difficult intubation    Dysrhythmia    H/O agent Orange exposure    Headache    migraine- not current   High bilirubin    pt states it's genetic   History of blood transfusion    Hypercholesteremia  Hypercholesterolemia    Lymphoma ([place]) 04/2018   Mitral valve disease    annuloplasty 2002 [facility]   Neuropathy    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 Maxwell Caul told him he had improved   Pneumonia    Thoracic ascending aortic aneurysm (HCC)    4.5 cm 03/2018 CT. 4.4 cm on echo 10/2021 and on CTA 03/2021   Thyroid cancer [facility])     Past Surgical History:  Procedure Laterality  Date   ABDOMINAL ANGIOGRAM  08/05/2012   aneurysm repair     CARDIOVERSION  02/12/2012   Procedure: CARDIOVERSION;  Surgeon: Pixie Casino, MD;  Location: [place];  Service: Cardiovascular;  Laterality: N/A;   CARDIOVERSION N/A 06/22/2013   Procedure: CARDIOVERSION;  Surgeon: [name], MD;  Location: [place];  Service: Cardiovascular;  Laterality: N/A;   CARDIOVERSION N/A 06/19/2021   Procedure: CARDIOVERSION;  Surgeon: Werner Lean, MD;  Location: [place];  Service: Cardiovascular;  Laterality: N/A;   CATARACT EXTRACTION Bilateral 2018   with lens implant   CHOLECYSTECTOMY     COLONOSCOPY     ESOPHAGOGASTRODUODENOSCOPY     implantable loop recorder placement  03/29/2021   Medtronic Reveal Linq model LNQ 22 [id number]) implantable loop recorder   IR IMAGING GUIDED PORT INSERTION  05/26/2018   IR REMOVAL TUN ACCESS W/ PORT W/O FL MOD SED  02/05/2021   LYMPH NODE BIOPSY Left 04/27/2018   Procedure: EXCISIONAL BIOPSY OF LEFT CERVICAL LYMPH NODE;  Surgeon: Melida Quitter, MD;  Location: [place];  Service: ENT;  Laterality: Left;   LYMPH NODE BIOPSY Left 11/23/2018   Procedure: LEFT CERVICAL LYMPH NODE OPEN BIOPSY;  Surgeon: Melida Quitter, MD;  Location: [place];  Service: ENT;  Laterality: Left;   MENISCUS REPAIR Right 2009   MITRAL VALVE REPAIR  2002   [facility]   NM MYOVIEW LTD  07/22/2006   no ischemia   RADICAL NECK DISSECTION Left 01/15/2019   Procedure: LEFT NECK DISSECTION;  Surgeon: Melida Quitter, MD;  Location: [place];  Service: ENT;  Laterality: Left;   RIGHT HEART CATH  06/19/2004   normal right heart dynamics. EF 50%   TEE WITHOUT CARDIOVERSION  02/12/2012   Procedure: TRANSESOPHAGEAL ECHOCARDIOGRAM (TEE);  Surgeon: Pixie Casino, MD;  Location: [facility] ENDOSCOPY;  Service: Cardiovascular;  Laterality: N/A;   TEE WITHOUT CARDIOVERSION N/A 06/22/2013   Procedure: TRANSESOPHAGEAL ECHOCARDIOGRAM (TEE);  Surgeon: [name], MD;  Location: [place];  Service: Cardiovascular;  Laterality: N/A;   THYROIDECTOMY N/A 01/15/2019   Procedure: TOTAL THYROIDECTOMY;  Surgeon: Melida Quitter, MD;  Location: [place];  Service: ENT;  Laterality: N/A;    Current Medications: No outpatient medications have been marked as taking for the 12/07/21 encounter (Appointment) with Freada Bergeron, MD.     Allergies:   Levaquin [levofloxacin in d5w], Nickel, Other, Pravastatin, Simvastatin, Statins, Allopurinol, Ampicillin, and Penicillin g   Social History   Socioeconomic History   Marital status: Married    Spouse name: [name]   Number of children: 2   Years of education: Not on file   Highest education level: Not on file  Occupational History   Occupation: retired Hydrologist  Tobacco Use   Smoking status: Never   Smokeless tobacco: Never  Vaping Use   Vaping Use: Never used  Substance and Sexual Activity   Alcohol use:  Not Currently    Comment: social   Drug use: No   Sexual activity: Not on file  Other Topics Concern   Not on file  Social History Narrative   Lives in [place], Retired   Social Determinants of Radio broadcast assistant Strain: Not on file  Food Insecurity: Not on file  Transportation Needs: Not on file  Physical Activity: Not on file  Stress: Not on file  Social Connections: Not on file     Family History: The patient's family history includes Cancer in his maternal grandmother; Heart attack in his maternal grandfather, paternal grandfather, and paternal grandmother; Heart failure (age of onset: 74) in his father; Parkinson's disease (age of onset: 72) in his mother.  ROS:   Please see the history of present illness.     Review of Systems  Constitutional:  Negative for chills, fever and malaise/fatigue.  HENT:  Negative for sore throat.   Eyes:  Negative for blurred vision.  Respiratory:  Positive for shortness of breath (exertional).   Cardiovascular:  Positive for chest pain and palpitations.  Negative for orthopnea, claudication, leg swelling and PND.  Gastrointestinal:  Negative for nausea and vomiting.  Genitourinary:  Negative for hematuria.  Musculoskeletal:  Negative for falls.  Neurological:  Negative for dizziness and loss of consciousness.  Psychiatric/Behavioral:  Negative for substance abuse.      EKGs/Labs/Other Studies Reviewed:    The following studies were reviewed today: TTE 11-13-2021: IMPRESSIONS     1. Left ventricular ejection fraction, by estimation, is 50 to 55%. The  left ventricle has normal function. The left ventricle has no regional  wall motion abnormalities. Left ventricular diastolic function could not  be evaluated.   2. Right ventricular systolic function is normal. The right ventricular  size is normal. There is normal pulmonary artery systolic pressure.   3. Left atrial size was severely dilated.   4. Right atrial size was severely dilated.   5. The mitral valve has been repaired/replaced. No evidence of mitral  valve regurgitation. No evidence of mitral stenosis. Procedure Date: 2002.   6. The aortic valve is tricuspid. Aortic valve regurgitation is not  visualized. Aortic valve sclerosis is present, with no evidence of aortic  valve stenosis.   7. There is moderate dilatation of the aortic root, measuring 40 mm.  There is moderate dilatation of the ascending aorta, measuring 44 mm.   8. The inferior vena cava is normal in size with greater than 50%  respiratory variability, suggesting right atrial pressure of 3 mmHg. TTE 10/26/20: IMPRESSIONS   1. Left ventricular ejection fraction, by estimation, is 50 to 55%. Left ventricular ejection fraction by 3D volume is 48 %. The left ventricle has mildly decreased function. The left ventricle demonstrates global hypokinesis. Left ventricular diastolic  parameters are consistent with Grade I diastolic dysfunction (impaired relaxation). Elevated left ventricular end-diastolic pressure. The average  left ventricular global longitudinal strain is 18.8 %. The global longitudinal strain is normal.   2. Right ventricular systolic function is low normal. The right  ventricular size is normal. There is normal pulmonary artery systolic pressure.   3. Left atrial size was mild to moderately dilated.   4. Right atrial size was mildly dilated.   5. The mitral valve has been repaired/replaced. Trivial mitral valve regurgitation. The mean mitral valve gradient is 1.0 mmHg with average heart rate of 58 bpm. There is a prosthetic annuloplasty ring present in the mitral position. Procedure Date: 2002.  6. The aortic valve is tricuspid. Aortic valve regurgitation is not visualized. Mild aortic valve sclerosis is present, with no evidence of aortic valve stenosis.   7. Aortic dilatation noted. There is mild dilatation of the aortic root, measuring 39 mm. There is mild dilatation of the ascending aorta, measuring 41 mm.   TTE 10/2019: ECHOCARDIOGRAPHIC DESCRIPTIONS -----------------------------------------------  AORTIC ROOT          Size: Not seen    Dissection: INDETERM FOR DISSECTION   AORTIC VALVE      Leaflets: Tricuspid             Morphology: MILDLY THICKENED      Mobility: Fully Mobile   LEFT VENTRICLE                                      Anterior: Normal          Size: Normal                                 Lateral: Normal   Contraction: Normal                                  Septal: Normal    Closest EF: 50% (Estimated)  Calc.EF: 48% (3D)      Apical: Normal     LV masses: No Masses                             Inferior: Normal           LVH: MODERATE LVH                         Posterior: Normal   LV GLS(AVG): -12.8% Normal Range [ <= -16]  Dias.FxClass: N/A   MITRAL VALVE      Leaflets: ABNORMAL                Mobility: Fully mobile    Morphology: PROSTHETIC RING   LEFT ATRIUM          Size: MILDLY ENLARGED     LA masses: No masses                Normal IAS   MAIN PA           Size: Not seen   PULMONIC VALVE    Morphology: Normal      Mobility: Fully Mobile   RIGHT VENTRICLE          Size: MILDLY ENLARGED           Free wall: Normal   Contraction: Normal                    RV masses: No Masses   TRICUSPID VALVE      Leaflets: Normal                  Mobility: Fully mobile    Morphology: Normal   RIGHT ATRIUM          Size: Normal                     RA Other: None     RA masses: No masses  PERICARDIUM         Fluid: No effusion   INFERIOR VENACAVA          Size: Not Seen   Not Seen   DOPPLER ECHO and OTHER SPECIAL PROCEDURES ------------------------------------     Aortic: No AR                  No AS      Mitral: MILD MR                PROSTHETIC MV RING      1.0 m/s peak vel    4 mmHg peak grad   1 mmHg mean grad     MV Inflow E Vel.= 80.0 cm/s  MV Annulus E'Vel.= 6.0 cm/s  E/E'Ratio= 13   Tricuspid: TRIVIAL TR             No TS   Pulmonary: No PR                  No PS       Other:   INTERPRETATION ---------------------------------------------------------------    MILD LV DYSFUNCTION (See above) WITH MODERATE LVH    NORMAL RIGHT VENTRICULAR SYSTOLIC FUNCTION    VALVULAR REGURGITATION: MILD MR, TRIVIAL TR    PROSTHETIC VALVE(S): PROSTHETIC MV RING    LV EF AT LOWER LIMITS OF NORMAL    3D acquisition and reconstructions were performed as part of this    examination to more accurately quantify the effects of identified    structural abnormalities as part of the exam. (post-processing on an    Independent workstation).     Compared with prior Echo study on 04/20/2013: NO SIGNIFICANT CHANGES   EKG:  EKG was not ordered today  Recent Labs: 05/27/2021: B Natriuretic Peptide 159.2 10/09/2021: ALT 30; BUN 26; Creatinine 1.26; Hemoglobin 13.1; Platelet Count 172; Potassium 4.1; Sodium 141  Recent Lipid Panel    Component Value Date/Time   CHOL 156 09/03/2021 0749   TRIG 92 09/03/2021 0749   HDL 61 09/03/2021 0749   CHOLHDL 2.6 09/03/2021  0749   CHOLHDL 3.2 02/22/2019 0757   VLDL 23 02/22/2019 0757   LDLCALC 78 09/03/2021 0749     Risk Assessment/Calculations:    CHA2DS2-VASc Score =    This indicates a  % annual risk of stroke. The patient's score is based upon:            Physical Exam:    VS:  There were no vitals taken for this visit.    Wt Readings from Last 3 Encounters:  10/09/21 220 lb 3.2 oz (99.9 kg)  07/03/21 219 lb (99.3 kg)  06/19/21 211 lb (95.7 kg)     GEN:  Well nourished, well developed in no acute distress HEENT: Normal NECK: No JVD; No carotid bruits CARDIAC: Irregularly irregular, 1/6 soft systolic murmur. No rubs, gallops RESPIRATORY:  Clear to auscultation without rales, wheezing or rhonchi  ABDOMEN: Soft, non-tender, non-distended MUSCULOSKELETAL:  no edema; No deformity  SKIN: Warm and dry NEUROLOGIC:  Alert and oriented x 3 PSYCHIATRIC:  Normal affect   ASSESSMENT:    No diagnosis found.   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 Overall stable. -Continue metop 12.'5mg'$  BID -Needs SBE PPX  #Heart Failure with  mildly Reduced LVEF: LVEF 45-50% on TTE in 10/2019 that improved to 50-55% in 10/2021. Appears relatively euvolemic today with NYHA class I-II symtpoms. Did not tolerate ACE due to hypotension. -Continue metop 12.'5mg'$  XL BID -Low Na diet -Compression socks for LE edema  #Paroxysmal atrial flutter: S/p ablation in 2015. Was not on [facility] due to history of RP bleeding in the past. Developed recurrent Aflutter on 05/27/21 s/p DCCV 06/19/21. May want to consider watchman in the future but patient is hesitant to do so. -Continue metop 12.'5mg'$  XL BID -Continue apixaban '5mg'$  BID -Follows with Dr. Curt Bears -May consider for repeat ablation +/- watchman in the  future given history of RP bleed  #Hyperlipidemia: No known coronary artery disease. Tolerating low dose crestor. -Continue crestor '5mg'$  M,W, F -Continue zetia '10mg'$  daily -Check lipids  #Multiple abdominal aortic aneurysms, status post partial coiling: #Ascending aortic aneurysm: Aneurysmal dilatation of the ascending thoracic aorta measuring 44 x 45 mm -Needs continued serial imaging -BP well controlled  #Thyroid Cancer: #Non-Hodgkins Lymphoma: In remission. Followed by Dr. Alvy Bimler. -Follow-up with Dr. Alvy Bimler as scheduled   Medication Adjustments/Labs and Tests Ordered: Current medicines are reviewed at length with the patient today.  Concerns regarding medicines are outlined above.  No orders of the defined types were placed in this encounter.  No orders of the defined types were placed in this encounter.   There are no Patient Instructions on file for this visit.   I,Mykaella Javier,acting as a scribe for Freada Bergeron, MD.,have documented all relevant documentation on the behalf of Freada Bergeron, MD,as directed by  Freada Bergeron, MD while in the presence of Freada Bergeron, MD.  I, Freada Bergeron, MD, have reviewed all documentation for this visit. The documentation on 12/05/21 for the exam, diagnosis, procedures, and orders are all accurate and complete.   Signed, Freada Bergeron, MD  12/05/2021 3:25 PM    [place]

## 2021-12-06 NOTE — Progress Notes (Signed)
Cardiology Office Note:    Date:  12/07/2021   ID:  Sean Stanley, DOB Aug 01, 1940, MRN [id number]  PCP:  Kathalene Frames, MD   [facility] Providers Cardiologist:  Lolita Patella, MD {   Referring MD: Kathalene Frames, *    History of Present Illness:    Sean Stanley is a 81 y.o. male with a very complicated cardiac and oncology history who was previously followed by Dr. Meda Coffee who now presents to clinic for follow-up.  The patient is a [place] [employer] veteran, he has been followed by Dr. Edwin Dada and cardiology at [facility].  The patient underwent minimally invasive mitral valve repair in October 2002 at [facility] and has been followed there since then.  He was briefly seen at [facility] for episode of atrial flutter followed by cardioversion in 2014.  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 In May 2015 he underwent atrial flutter ablation.   He has also been followed by Dr. 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 He is currently in remission for both of his cancers. He has residual vocal cord dysfunction.  Saw Dr. Meda Coffee on 03/27/20, the patient was stable from CV standpoint. He underwent TTE at [facility] 10/2019 that showed mild left ventricle dysfunction with LVEF of 45 to 50%, global longitudinal strain -12.8%, moderate concentric left ventricular hypertrophy, normal right ventricular systolic function, status post mitral valve repair with prosthetic mitral valve ring in place, with mild residual mitral regurgitation, moderately dilated left atrium, trivial tricuspid regurgitation, normal size of the right atrium and normal right-sided pressures.  Saw Dr. Rayann Heman  03/2021. He requested loop at that time.   Presented to ER in 05/2021 with rapid HR found to be in atypical Aflutter that did not respond to IV metop. Seen in Afib clinic on 05/28/21 and was resumed on apixaban and recommended for DCCV. He had cardioversion on 06/19/21 with return to NSR.   Saw Dr. Curt Bears in 06/2021 where he remained in NSR and recommended for continued medical therapy.  Repeat TTE 10/2021 with LVEF 50-55%, normal RV, severe biatrial enlargement, normal functioning MVR with no MS/MR, mild aortic root and ascending aortic dilation at 14m and 472mrespectively.  Today, the patient states that he has had several life stressors lately. He is not sleeping well at night. He is feeling anxious and stressed and is trying to work through things.  He is also concerned with the aortic dilation, LAE and RAE noted on his TTE 10/2021. We reviewed this in detail and answered his questions to his understanding.   Additionally we reviewed the last interrogation of his loop recorder. He denies any recent arrhythmias. When he previously had episodes of arrhythmia he was aware of strong palpitations and significant fatigue. No recent issues with palpitations or fatigue.  He endorses peripheral neuropathy that is worsening, now up to his calves. Last night he had felt miserable after walking a long time throughout the day. He was suffering from his neuropathy and leg cramps while trying to sleep. Last night he tried voltaren gel, and it seemed to help somewhat. Previously he tried taking magnesium but developed an allergic reaction. He wants to try to continue to manage conservatively.  He notes that his voice is persistently hoarse due to his papillary thyroid cancer.  He denies any chest pain, shortness of breath, lightheadedness, headaches, syncope, orthopnea, or PND.    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   s/p coiling 12/16/12 - [facility]   Atrial flutter (HCC)    BPH (benign prostatic hyperplasia)    Cancer ([place])    melanoma on lower right back and left chest - surgically removed and cleared   Difficult intubation    Dysrhythmia    H/O agent Orange exposure    Headache    migraine- not current   High bilirubin    pt states it's genetic   History of blood transfusion    Hypercholesteremia    Hypercholesterolemia    Lymphoma ([place]) 04/2018   Mitral valve disease    annuloplasty 2002 [facility]   Neuropathy    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 Maxwell Caul told him he had improved   Pneumonia    Thoracic ascending aortic aneurysm ([place])    4.5 cm 03/2018 CT. 4.4 cm on echo 10/2021 and on CTA 03/2021   Thyroid cancer [facility])     Past Surgical History:  Procedure Laterality Date   ABDOMINAL ANGIOGRAM  08/05/2012   aneurysm repair     CARDIOVERSION  02/12/2012   Procedure: CARDIOVERSION;  Surgeon: Pixie Casino, MD;  Location: [place];  Service: Cardiovascular;  Laterality: N/A;   CARDIOVERSION N/A 06/22/2013   Procedure: CARDIOVERSION;  Surgeon: [name], MD;  Location: [place];  Service: Cardiovascular;  Laterality: N/A;   CARDIOVERSION N/A 06/19/2021   Procedure: CARDIOVERSION;  Surgeon: Werner Lean, MD;  Location: [place];  Service: Cardiovascular;  Laterality: N/A;   CATARACT EXTRACTION Bilateral 2018   with lens implant   CHOLECYSTECTOMY     COLONOSCOPY     ESOPHAGOGASTRODUODENOSCOPY     implantable loop recorder placement  03/29/2021    Medtronic Reveal Linq model LNQ [id number]) implantable loop recorder   IR IMAGING GUIDED PORT INSERTION  05/26/2018   IR REMOVAL TUN ACCESS W/ PORT W/O FL MOD SED  02/05/2021   LYMPH NODE BIOPSY Left 04/27/2018   Procedure: EXCISIONAL BIOPSY OF LEFT CERVICAL LYMPH NODE;  Surgeon: Melida Quitter, MD;  Location: [place];  Service: ENT;  Laterality: Left;   LYMPH NODE BIOPSY Left 11/23/2018   Procedure: LEFT CERVICAL LYMPH NODE OPEN BIOPSY;  Surgeon: Melida Quitter, MD;  Location: [place];  Service: ENT;  Laterality: Left;   MENISCUS REPAIR Right 2009   MITRAL VALVE REPAIR  2002   [facility]   NM MYOVIEW LTD  07/22/2006   no ischemia   RADICAL NECK DISSECTION Left 01/15/2019   Procedure: LEFT NECK DISSECTION;  Surgeon: Melida Quitter, MD;  Location: [place];  Service: ENT;  Laterality: Left;   RIGHT HEART CATH  06/19/2004   normal right heart dynamics. EF 50%   TEE WITHOUT CARDIOVERSION  02/12/2012   Procedure: TRANSESOPHAGEAL ECHOCARDIOGRAM (TEE);  Surgeon: Pixie Casino, MD;  Location[facility] ENDOSCOPY;  Service: Cardiovascular;  Laterality: N/A;   TEE WITHOUT CARDIOVERSION N/A 06/22/2013   Procedure: TRANSESOPHAGEAL ECHOCARDIOGRAM (TEE);  Surgeon: [name], MD;  Location: [place];  Service: Cardiovascular;  Laterality: N/A;   THYROIDECTOMY N/A 01/15/2019   Procedure: TOTAL THYROIDECTOMY;  Surgeon: Melida Quitter, MD;  Location: [facility] OR;  Service: ENT;  Laterality: N/A;    Current Medications: Current Meds  Medication Sig   acetaminophen (TYLENOL) 500 MG tablet Take 1,000 mg by mouth daily as needed for moderate pain.   alfuzosin (UROXATRAL) 10 MG 24 hr tablet Take 10 mg by mouth at bedtime.    apixaban (ELIQUIS) 5 MG TABS tablet Take 1 tablet (5 mg total) by mouth 2 (two) times daily.   buPROPion (WELLBUTRIN XL) 150 MG 24 hr tablet Take 1 tablet (150 mg total) by mouth daily.   Cholecalciferol (VITAMIN D) 2000 units tablet Take 2,000 Units by mouth at bedtime.    ezetimibe (ZETIA) 10  MG tablet Take 1 tablet (10 mg total) by mouth every morning.   finasteride (PROSCAR) 5 MG tablet Take 5 mg by mouth daily with supper.   levothyroxine (SYNTHROID) 175 MCG tablet Take 1 tablet (175 mcg total) by mouth daily.   lidocaine-prilocaine (EMLA) cream Apply to affected area once before sleep time-   metoprolol succinate (TOPROL-XL) 25 MG 24 hr tablet Take 12.5 mg by mouth daily.   Polyethyl Glycol-Propyl Glycol (SYSTANE HYDRATION PF OP) Place 1 drop into both eyes 3 (three) times daily.   rosuvastatin (CRESTOR) 5 MG tablet Take 1 tablet (5 mg total) by mouth every Monday, Wednesday, and Friday.     Allergies:   Levaquin [levofloxacin in d5w], Nickel, Other, Pravastatin, Simvastatin, Statins, Allopurinol, Ampicillin, and Penicillin g   Social History   Socioeconomic History   Marital status: Married    Spouse name: [name]   Number of children: 2   Years of education: Not on file   Highest education level: Not on file  Occupational History   Occupation: retired Hydrologist  Tobacco Use   Smoking status: Never   Smokeless tobacco: Never  Vaping Use   Vaping Use: Never used  Substance and Sexual Activity   Alcohol use: Not Currently    Comment: social   Drug use: No   Sexual activity: Not on file  Other Topics Concern   Not on file  Social History Narrative   Lives in [place], Retired   Science writer Determinants of Radio broadcast assistant Strain: Not on file  Food Insecurity: Not on file  Transportation Needs: Not on file  Physical Activity: Not on file  Stress: Not on file  Social Connections: Not on file     Family History: The patient's family history includes Cancer in his maternal grandmother; Heart attack in his maternal grandfather, paternal grandfather, and paternal grandmother; Heart failure (age of onset: 88) in his father; Parkinson's disease (age of onset: 3) in his mother.  ROS:   Please see the history of present illness.     Review of Systems   Constitutional:  Negative for chills, fever and malaise/fatigue.  HENT:  Negative for sore throat.   Eyes:  Negative for blurred vision.  Respiratory:  Negative for cough and sputum production.   Cardiovascular:  Positive for leg swelling (Intermittent). Negative for chest pain, palpitations, orthopnea, claudication and PND.  Gastrointestinal:  Negative for nausea and vomiting.  Genitourinary:  Negative for hematuria.  Musculoskeletal:  Positive for myalgias (LE muscle cramps). Negative for falls.  Neurological:  Negative for dizziness and loss of consciousness.  Psychiatric/Behavioral:  Negative for substance abuse. The patient is nervous/anxious (Stress) and has insomnia.      EKGs/Labs/Other Studies Reviewed:    The following studies were reviewed today:  TTE 10/2021: IMPRESSIONS    1. Left ventricular ejection fraction, by estimation, is 50 to 55%. The  left ventricle has normal function. The left ventricle has no regional  wall motion abnormalities. Left ventricular diastolic function could not  be evaluated.   2. Right ventricular systolic function is normal. The right ventricular  size is normal. There is normal pulmonary artery systolic pressure.   3. Left atrial size was severely dilated.   4. Right atrial size was severely dilated.   5. The mitral valve has been repaired/replaced. No evidence of mitral  valve regurgitation. No evidence of mitral stenosis. Procedure Date: 2002.   6. The aortic valve is tricuspid. Aortic valve regurgitation is not  visualized. Aortic valve sclerosis is present, with no evidence of aortic  valve stenosis.   7. There is moderate dilatation of the aortic root, measuring 40 mm.  There is moderate dilatation of the ascending aorta, measuring 44 mm.   8. The inferior vena cava is normal in size with greater than 50%  respiratory variability, suggesting right atrial pressure of 3 mmHg.  TTE 10/26/20: IMPRESSIONS   1. Left ventricular  ejection fraction, by estimation, is 50 to 55%. Left ventricular ejection fraction by 3D volume is 48 %. The left ventricle has mildly decreased function. The left ventricle demonstrates global hypokinesis. Left ventricular diastolic  parameters are consistent with Grade I diastolic dysfunction (impaired relaxation). Elevated left ventricular end-diastolic pressure. The average left ventricular global longitudinal strain is 18.8 %. The global longitudinal strain is normal.   2. Right ventricular systolic function is low normal. The right  ventricular size is normal. There is normal pulmonary artery systolic pressure.   3. Left atrial size was mild to moderately dilated.   4. Right atrial size was mildly dilated.   5. The mitral valve has been repaired/replaced. Trivial mitral valve regurgitation. The mean mitral valve gradient is 1.0 mmHg with average heart rate of 58 bpm. There is a prosthetic annuloplasty ring present in the mitral position. Procedure Date: 2002.   6. The aortic valve is tricuspid. Aortic valve regurgitation is not visualized. Mild aortic valve sclerosis is present, with no evidence of aortic valve stenosis.   7. Aortic dilatation noted. There is mild dilatation of the aortic root, measuring 39 mm. There is mild dilatation of the ascending aorta, measuring 41 mm.   TTE 10/2019: ECHOCARDIOGRAPHIC DESCRIPTIONS -----------------------------------------------  AORTIC ROOT          Size: Not seen    Dissection: INDETERM FOR DISSECTION   AORTIC VALVE      Leaflets: Tricuspid             Morphology: MILDLY THICKENED      Mobility: Fully Mobile   LEFT VENTRICLE                                      Anterior: Normal          Size: Normal  Lateral: Normal   Contraction: Normal                                  Septal: Normal    Closest EF: 50% (Estimated)  Calc.EF: 48% (3D)      Apical: Normal     LV masses: No Masses                             Inferior:  Normal           LVH: MODERATE LVH                         Posterior: Normal   LV GLS(AVG): -12.8% Normal Range [ <= -16]  Dias.FxClass: N/A   MITRAL VALVE      Leaflets: ABNORMAL                Mobility: Fully mobile    Morphology: PROSTHETIC RING   LEFT ATRIUM          Size: MILDLY ENLARGED     LA masses: No masses                Normal IAS   MAIN PA          Size: Not seen   PULMONIC VALVE    Morphology: Normal      Mobility: Fully Mobile   RIGHT VENTRICLE          Size: MILDLY ENLARGED           Free wall: Normal   Contraction: Normal                    RV masses: No Masses   TRICUSPID VALVE      Leaflets: Normal                  Mobility: Fully mobile    Morphology: Normal   RIGHT ATRIUM          Size: Normal                     RA Other: None     RA masses: No masses   PERICARDIUM         Fluid: No effusion   INFERIOR VENACAVA          Size: Not Seen   Not Seen   DOPPLER ECHO and OTHER SPECIAL PROCEDURES ------------------------------------     Aortic: No AR                  No AS      Mitral: MILD MR                PROSTHETIC MV RING      1.0 m/s peak vel    4 mmHg peak grad   1 mmHg mean grad     MV Inflow E Vel.= 80.0 cm/s  MV Annulus E'Vel.= 6.0 cm/s  E/E'Ratio= 13   Tricuspid: TRIVIAL TR             No TS   Pulmonary: No PR                  No PS       Other:   INTERPRETATION ---------------------------------------------------------------    MILD LV DYSFUNCTION (See above) WITH MODERATE LVH  NORMAL RIGHT VENTRICULAR SYSTOLIC FUNCTION    VALVULAR REGURGITATION: MILD MR, TRIVIAL TR    PROSTHETIC VALVE(S): PROSTHETIC MV RING    LV EF AT LOWER LIMITS OF NORMAL    3D acquisition and reconstructions were performed as part of this    examination to more accurately quantify the effects of identified    structural abnormalities as part of the exam. (post-processing on an    Independent workstation).     Compared with prior Echo study on 04/20/2013:  NO SIGNIFICANT CHANGES   EKG:  EKG is personally reviewed. 12/07/2021:  EKG was not ordered. 07/03/2021 (Dr. Curt Bears): Sinus rhythm.   Recent Labs: 05/27/2021: B Natriuretic Peptide 159.2 10/09/2021: ALT 30; BUN 26; Creatinine 1.26; Hemoglobin 13.1; Platelet Count 172; Potassium 4.1; Sodium 141   Recent Lipid Panel    Component Value Date/Time   CHOL 156 09/03/2021 0749   TRIG 92 09/03/2021 0749   HDL 61 09/03/2021 0749   CHOLHDL 2.6 09/03/2021 0749   CHOLHDL 3.2 02/22/2019 0757   VLDL 23 02/22/2019 0757   LDLCALC 78 09/03/2021 0749     Risk Assessment/Calculations:    CHA2DS2-VASc Score = 4  This indicates a 4.8% annual risk of stroke. The patient's score is based upon: CHF History: 1 HTN History: 0 Diabetes History: 0 Stroke History: 0 Vascular Disease History: 1 Age Score: 2 Gender Score: 0           Physical Exam:    VS:  BP 120/72   Pulse 67   Ht '6\' 1"'$  (1.854 m)   Wt 220 lb 9.6 oz (100.1 kg)   SpO2 97%   BMI 29.10 kg/m     Wt Readings from Last 3 Encounters:  12/07/21 220 lb 9.6 oz (100.1 kg)  10/09/21 220 lb 3.2 oz (99.9 kg)  07/03/21 219 lb (99.3 kg)     GEN:  Well nourished, well developed in no acute distress HEENT: Normal NECK: No JVD; No carotid bruits CARDIAC: RRR, 1/6 soft systolic murmur. No rubs, gallops RESPIRATORY:  Clear to auscultation without rales, wheezing or rhonchi  ABDOMEN: Soft, non-tender, non-distended MUSCULOSKELETAL:  no edema; No deformity  SKIN: Warm and dry NEUROLOGIC:  Alert and oriented x 3 PSYCHIATRIC:  Normal affect   ASSESSMENT:    1. Mitral valve disease   2. Paroxysmal atrial fibrillation (HCC)   3. Hyperlipidemia, unspecified hyperlipidemia type   4. Secondary hypercoagulable state ([place])   5. S/P mitral valve repair   6. Infrarenal abdominal aortic aneurysm (AAA) without rupture (HCC)   7. Statin intolerance   8. Nonrheumatic mitral valve regurgitation   9. 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 Overall stable. -Continue metop 12.'5mg'$  BID -Continue SBE PPX  #Heart Failure with mildly Reduced LVEF: LVEF 45-50% on TTE in 10/2019 that improved to 50-55% in 10/2021. Appears  euvolemic today with NYHA class I-II symtpoms. Did not tolerate ACE due to hypotension. -Continue metop 12.'5mg'$  XL daily -Low Na diet -Compression socks for LE edema  #Paroxysmal atrial flutter: S/p ablation in 2015. [facility] in [facility]. Developed recurrent Aflutter on 05/27/21 s/p DCCV 06/19/21. May want to consider watchman in the future but  patient is hesitant to do so. Follows with Dr. Curt Bears. -Continue metop 12.'5mg'$  XL daily -Continue apixaban '5mg'$  BID -Follows with Dr. Curt Bears  #Hyperlipidemia: No known coronary artery disease. Tolerating low dose crestor. -Continue crestor '5mg'$  M,W, F -Continue zetia '10mg'$  daily -Check lipids  #Multiple abdominal aortic aneurysms, status post partial coiling: #Ascending aortic aneurysm: Aneurysmal dilatation of the ascending thoracic aorta measuring 44 x 45 mm -Needs continued serial imaging -BP well controlled -Follows with vascular at [facility]  #Thyroid Cancer: #Non-Hodgkins Lymphoma: In remission. Followed by Dr. Alvy Bimler. -Follow-up with Dr. Alvy Bimler as scheduled  Follow-up:  6 months.  Medication Adjustments/Labs and Tests Ordered: Current medicines are reviewed at length with the patient today.  Concerns regarding medicines are outlined above.   No orders of the defined types were placed in this encounter.  No orders of the defined types were placed in this encounter.  Patient Instructions  Medication Instructions:   Your physician recommends that you  continue on your current medications as directed. Please refer to the Current Medication list given to you today.  *If you need a refill on your cardiac medications before your next appointment, please call your pharmacy*    Follow-Up: At [facility], you and your health needs are our priority.  As part of our continuing mission to provide you with exceptional heart care, we have created designated Provider Care Teams.  These Care Teams include your primary Cardiologist (physician) and Advanced Practice Providers (APPs -  Physician Assistants and Nurse Practitioners) who all work together to provide you with the care you need, when you need it.  We recommend signing up for the patient portal called "MyChart".  Sign up information is provided on this After Visit Summary.  MyChart is used to connect with patients for Virtual Visits (Telemedicine).  Patients are able to view lab/test results, encounter notes, upcoming appointments, etc.  Non-urgent messages can be sent to your provider as well.   To learn more about what you can do with MyChart, go to [email or web address].    Your next appointment:   6 month(s)  The format for your next appointment:   In Person  Provider:   Dr. Johney Frame   Important Information About Sugar        I,Mathew Stumpf,acting as a scribe for Freada Bergeron, MD.,have documented all relevant documentation on the behalf of Freada Bergeron, MD,as directed by  Freada Bergeron, MD while in the presence of Freada Bergeron, MD.  I, Freada Bergeron, MD, have reviewed all documentation for this visit. The documentation on 12/07/21 for the exam, diagnosis, procedures, and orders are all accurate and complete.   Signed, Freada Bergeron, MD  12/07/2021 9:42 AM    [place]

## 2021-12-07 ENCOUNTER — Encounter: Payer: Self-pay | Admitting: Cardiology

## 2021-12-07 ENCOUNTER — Ambulatory Visit: Payer: Medicare Other | Attending: Cardiology | Admitting: Cardiology

## 2021-12-07 VITALS — BP 120/72 | HR 67 | Ht 73.0 in | Wt 220.6 lb

## 2021-12-07 DIAGNOSIS — I059 Rheumatic mitral valve disease, unspecified: Secondary | ICD-10-CM | POA: Insufficient documentation

## 2021-12-07 DIAGNOSIS — I5022 Chronic systolic (congestive) heart failure: Secondary | ICD-10-CM | POA: Diagnosis not present

## 2021-12-07 DIAGNOSIS — E785 Hyperlipidemia, unspecified: Secondary | ICD-10-CM | POA: Insufficient documentation

## 2021-12-07 DIAGNOSIS — Z9889 Other specified postprocedural states: Secondary | ICD-10-CM | POA: Insufficient documentation

## 2021-12-07 DIAGNOSIS — I7143 Infrarenal abdominal aortic aneurysm, without rupture: Secondary | ICD-10-CM

## 2021-12-07 DIAGNOSIS — Z789 Other specified health status: Secondary | ICD-10-CM

## 2021-12-07 DIAGNOSIS — I340 Nonrheumatic mitral (valve) insufficiency: Secondary | ICD-10-CM | POA: Diagnosis not present

## 2021-12-07 DIAGNOSIS — D6869 Other thrombophilia: Secondary | ICD-10-CM | POA: Insufficient documentation

## 2021-12-07 DIAGNOSIS — I480 Paroxysmal atrial fibrillation: Secondary | ICD-10-CM | POA: Diagnosis not present

## 2021-12-07 NOTE — Patient Instructions (Signed)
Medication Instructions:   Your physician recommends that you continue on your current medications as directed. Please refer to the Current Medication list given to you today.  *If you need a refill on your cardiac medications before your next appointment, please call your pharmacy*   Follow-Up: At [facility], you and your health needs are our priority.  As part of our continuing mission to provide you with exceptional heart care, we have created designated Provider Care Teams.  These Care Teams include your primary Cardiologist (physician) and Advanced Practice Providers (APPs -  Physician Assistants and Nurse Practitioners) who all work together to provide you with the care you need, when you need it.  We recommend signing up for the patient portal called "MyChart".  Sign up information is provided on this After Visit Summary.  MyChart is used to connect with patients for Virtual Visits (Telemedicine).  Patients are able to view lab/test results, encounter notes, upcoming appointments, etc.  Non-urgent messages can be sent to your provider as well.   To learn more about what you can do with MyChart, go to [email or web address].    Your next appointment:   6 month(s)  The format for your next appointment:   In Person  Provider:   Dr. Pemberton   Important Information About Sugar       

## 2021-12-17 NOTE — Progress Notes (Signed)
[facility] Summary Report / Loop Recorder 

## 2021-12-18 DIAGNOSIS — I4892 Unspecified atrial flutter: Secondary | ICD-10-CM | POA: Diagnosis not present

## 2021-12-18 DIAGNOSIS — E890 Postprocedural hypothyroidism: Secondary | ICD-10-CM | POA: Diagnosis not present

## 2021-12-18 DIAGNOSIS — I429 Cardiomyopathy, unspecified: Secondary | ICD-10-CM | POA: Diagnosis not present

## 2021-12-18 DIAGNOSIS — I700 Atherosclerosis of aorta: Secondary | ICD-10-CM | POA: Diagnosis not present

## 2021-12-18 DIAGNOSIS — C8310 Mantle cell lymphoma, unspecified site: Secondary | ICD-10-CM | POA: Diagnosis not present

## 2021-12-18 DIAGNOSIS — Z952 Presence of prosthetic heart valve: Secondary | ICD-10-CM | POA: Diagnosis not present

## 2021-12-18 DIAGNOSIS — N1832 Chronic kidney disease, stage 3b: Secondary | ICD-10-CM | POA: Diagnosis not present

## 2021-12-18 DIAGNOSIS — G609 Hereditary and idiopathic neuropathy, unspecified: Secondary | ICD-10-CM | POA: Diagnosis not present

## 2021-12-18 DIAGNOSIS — J3800 Paralysis of vocal cords and larynx, unspecified: Secondary | ICD-10-CM | POA: Diagnosis not present

## 2021-12-18 DIAGNOSIS — I2510 Atherosclerotic heart disease of native coronary artery without angina pectoris: Secondary | ICD-10-CM | POA: Diagnosis not present

## 2021-12-18 DIAGNOSIS — E785 Hyperlipidemia, unspecified: Secondary | ICD-10-CM | POA: Diagnosis not present

## 2021-12-18 DIAGNOSIS — N401 Enlarged prostate with lower urinary tract symptoms: Secondary | ICD-10-CM | POA: Diagnosis not present

## 2021-12-20 LAB — CUP PACEART REMOTE DEVICE CHECK
Date Time Interrogation Session: 20230927185110
Implantable Pulse Generator Implant Date: 20230105

## 2021-12-24 ENCOUNTER — Ambulatory Visit (INDEPENDENT_AMBULATORY_CARE_PROVIDER_SITE_OTHER): Payer: Medicare Other

## 2021-12-24 DIAGNOSIS — I480 Paroxysmal atrial fibrillation: Secondary | ICD-10-CM

## 2021-12-31 ENCOUNTER — Other Ambulatory Visit (HOSPITAL_COMMUNITY): Payer: Self-pay | Admitting: Pediatrics

## 2021-12-31 ENCOUNTER — Other Ambulatory Visit (HOSPITAL_COMMUNITY): Payer: Self-pay | Admitting: Nurse Practitioner

## 2021-12-31 ENCOUNTER — Other Ambulatory Visit (HOSPITAL_BASED_OUTPATIENT_CLINIC_OR_DEPARTMENT_OTHER): Payer: Self-pay | Admitting: Nurse Practitioner

## 2021-12-31 DIAGNOSIS — I728 Aneurysm of other specified arteries: Secondary | ICD-10-CM

## 2021-12-31 DIAGNOSIS — I7121 Aneurysm of the ascending aorta, without rupture: Secondary | ICD-10-CM | POA: Diagnosis not present

## 2021-12-31 DIAGNOSIS — I722 Aneurysm of renal artery: Secondary | ICD-10-CM | POA: Diagnosis not present

## 2022-01-02 ENCOUNTER — Ambulatory Visit: Payer: Medicare Other | Attending: Cardiology | Admitting: Cardiology

## 2022-01-02 ENCOUNTER — Encounter: Payer: Self-pay | Admitting: Cardiology

## 2022-01-02 VITALS — BP 116/70 | HR 71 | Ht 72.0 in | Wt 217.8 lb

## 2022-01-02 DIAGNOSIS — D6869 Other thrombophilia: Secondary | ICD-10-CM | POA: Diagnosis not present

## 2022-01-02 DIAGNOSIS — I484 Atypical atrial flutter: Secondary | ICD-10-CM | POA: Insufficient documentation

## 2022-01-02 DIAGNOSIS — I480 Paroxysmal atrial fibrillation: Secondary | ICD-10-CM | POA: Diagnosis not present

## 2022-01-02 DIAGNOSIS — I2510 Atherosclerotic heart disease of native coronary artery without angina pectoris: Secondary | ICD-10-CM | POA: Diagnosis not present

## 2022-01-02 NOTE — Patient Instructions (Addendum)
Medication Instructions:  Your physician recommends that you continue on your current medications as directed. Please refer to the Current Medication list given to you today.  *If you need a refill on your cardiac medications before your next appointment, please call your pharmacy*   Lab Work: None ordered   Testing/Procedures: None ordered   Follow-Up: At [facility], you and your health needs are our priority.  As part of our continuing mission to provide you with exceptional heart care, we have created designated Provider Care Teams.  These Care Teams include your primary Cardiologist (physician) and Advanced Practice Providers (APPs -  Physician Assistants and Nurse Practitioners) who all work together to provide you with the care you need, when you need it.  Remote monitoring is used to monitor your Pacemaker of ICD from home. This monitoring reduces the number of office visits required to check your device to one time per year. It allows [place] to keep an eye on the functioning of your device to ensure it is working properly. You are scheduled for a device check from home on 01/28/2022. You may send your transmission at any time that day. If you have a wireless device, the transmission will be sent automatically. After your physician reviews your transmission, you will receive a postcard with your next transmission date.  Your next appointment:   1 year(s)  The format for your next appointment:   In Person  Provider:   You will see one of the following Advanced Practice Providers on your designated Care Team:   Tommye Standard, [place] Legrand Como "Jonni Sanger" Chalmers Cater, [place]      Thank you for choosing [facility]!!   Trinidad Curet, RN [phone]  Other Instructions   Important Information About Sugar

## 2022-01-02 NOTE — Progress Notes (Signed)
Electrophysiology Office Note   Date:  01/02/2022   ID:  Sean Stanley, DOB Jul 31, 1940, MRN [id number]  PCP:  Sean Frames, MD  Cardiologist:  Johney Frame Primary Electrophysiologist: Sean Stanley Sean Leeds, MD    Chief Complaint: atrial flutter   History of Present Illness: Sean Stanley is a 81 y.o. male who is being seen today for the evaluation of atrial flutter at the request of Sean Stanley, *. Presenting today for electrophysiology evaluation.  Has a history significant for ruptured aortic aneurysm in 2014, renal artery aneurysm, atrial flutter, hyperlipidemia, sleep apnea, thoracic aortic aneurysm, atrial fibrillation, minimally invasive mitral valve repair in 2002.  He also has an atrial flutter ablation in 2015.  He has had 1 episode of atrial fibrillation around the time of COVID-19 infection.  He has a Linq monitor implanted for atrial fibrillation monitoring.  He has since continued to have episodes of atrial fibrillation and atypical atrial flutter.  He has had multiple cardioversions in the past.  Today, denies symptoms of palpitations, chest pain, shortness of breath, orthopnea, PND, lower extremity edema, claudication, dizziness, presyncope, syncope, bleeding, or neurologic sequela. The patient is tolerating medications without difficulties.  Since being seen he has done well.  He has had no further episodes of atrial fibrillation since March.  He is overall been quite happy with how he has been controlled.   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 Maxwell Caul told him he had improved   Pneumonia    Thoracic ascending aortic aneurysm ([place])    4.5 cm 03/2018 CT. 4.4 cm on echo 10/2021 and on CTA 03/2021   Thyroid cancer [facility])    Past Surgical History:  Procedure Laterality Date   ABDOMINAL ANGIOGRAM  08/05/2012   aneurysm repair     CARDIOVERSION  02/12/2012   Procedure: CARDIOVERSION;  Surgeon: Pixie Casino, MD;  Location: [facility] ENDOSCOPY;  Service: Cardiovascular;  Laterality: N/A;   CARDIOVERSION N/A 06/22/2013   Procedure: CARDIOVERSION;  Surgeon: [name], MD;  Location: [facility] ENDOSCOPY;  Service: Cardiovascular;  Laterality: N/A;   CARDIOVERSION N/A 06/19/2021   Procedure: CARDIOVERSION;  Surgeon: Werner Lean, MD;  Location: [place];  Service: Cardiovascular;  Laterality: N/A;  CATARACT EXTRACTION Bilateral 2018   with lens implant   CHOLECYSTECTOMY     COLONOSCOPY     ESOPHAGOGASTRODUODENOSCOPY     implantable loop recorder placement  03/29/2021   Medtronic Reveal Linq model LNQ 22 [id number]) implantable loop recorder   IR IMAGING GUIDED PORT INSERTION  05/26/2018   IR REMOVAL TUN ACCESS W/ PORT W/O FL MOD SED  02/05/2021   LYMPH NODE BIOPSY Left 04/27/2018   Procedure: EXCISIONAL BIOPSY OF LEFT CERVICAL LYMPH NODE;  Surgeon: Melida Quitter, MD;  Location: [place];   Service: ENT;  Laterality: Left;   LYMPH NODE BIOPSY Left 11/23/2018   Procedure: LEFT CERVICAL LYMPH NODE OPEN BIOPSY;  Surgeon: Melida Quitter, MD;  Location: [place];  Service: ENT;  Laterality: Left;   MENISCUS REPAIR Right 2009   MITRAL VALVE REPAIR  2002   [facility]   NM MYOVIEW LTD  07/22/2006   no ischemia   RADICAL NECK DISSECTION Left 01/15/2019   Procedure: LEFT NECK DISSECTION;  Surgeon: Melida Quitter, MD;  Location: [place];  Service: ENT;  Laterality: Left;   RIGHT HEART CATH  06/19/2004   normal right heart dynamics. EF 50%   TEE WITHOUT CARDIOVERSION  02/12/2012   Procedure: TRANSESOPHAGEAL ECHOCARDIOGRAM (TEE);  Surgeon: Pixie Casino, MD;  Location: [facility] ENDOSCOPY;  Service: Cardiovascular;  Laterality: N/A;   TEE WITHOUT CARDIOVERSION N/A 06/22/2013   Procedure: TRANSESOPHAGEAL ECHOCARDIOGRAM (TEE);  Surgeon: [name], MD;  Location: [place];  Service: Cardiovascular;  Laterality: N/A;   THYROIDECTOMY N/A 01/15/2019   Procedure: TOTAL THYROIDECTOMY;  Surgeon: Melida Quitter, MD;  Location: [facility] OR;  Service: ENT;  Laterality: N/A;     Current Outpatient Medications  Medication Sig Dispense Refill   acetaminophen (TYLENOL) 500 MG tablet Take 1,000 mg by mouth daily as needed for moderate pain.     alfuzosin (UROXATRAL) 10 MG 24 hr tablet Take 10 mg by mouth at bedtime.      apixaban (ELIQUIS) 5 MG TABS tablet Take 1 tablet (5 mg total) by mouth 2 (two) times daily. 60 tablet 3   buPROPion (WELLBUTRIN XL) 150 MG 24 hr tablet Take 1 tablet (150 mg total) by mouth daily. 90 tablet 3   Cholecalciferol (VITAMIN D) 2000 units tablet Take 2,000 Units by mouth at bedtime.      ezetimibe (ZETIA) 10 MG tablet Take 1 tablet (10 mg total) by mouth every morning. 90 tablet 2   finasteride (PROSCAR) 5 MG tablet Take 5 mg by mouth daily with supper.     levothyroxine (SYNTHROID) 175 MCG tablet Take 1 tablet (175 mcg total) by mouth daily.     lidocaine-prilocaine (EMLA) cream  Apply to affected area once before sleep time- 30 g 3   metoprolol succinate (TOPROL-XL) 25 MG 24 hr tablet Take 12.5 mg by mouth daily.     Polyethyl Glycol-Propyl Glycol (SYSTANE HYDRATION PF OP) Place 1 drop into both eyes 3 (three) times daily.     rosuvastatin (CRESTOR) 5 MG tablet Take 1 tablet (5 mg total) by mouth every Monday, Wednesday, and Friday. 36 tablet 3   No current facility-administered medications for this visit.    Allergies:   Levaquin [levofloxacin in d5w], Nickel, Other, Pravastatin, Simvastatin, Statins, Allopurinol, Ampicillin, and Penicillin g   Social History:  The patient  reports that he has never smoked. He has never used smokeless tobacco. He reports that he does not currently use alcohol. He reports that he does not use drugs.  Family History:  The patient's family history includes Cancer in his maternal grandmother; Heart attack in his maternal grandfather, paternal grandfather, and paternal grandmother; Heart failure (age of onset: 70) in his father; Parkinson's disease (age of onset: 51) in his mother.   ROS:  Please see the history of present illness.   Otherwise, review of systems is positive for none.   All other systems are reviewed and negative.   PHYSICAL EXAM: VS:  BP 116/70   Pulse 71   Ht 6' (1.829 m)   Wt 217 lb 12.8 oz (98.8 kg)   SpO2 96%   BMI 29.54 kg/m  , BMI Body mass index is 29.54 kg/m. GEN: Well nourished, well developed, in no acute distress  HEENT: normal  Neck: no JVD, carotid bruits, or masses Cardiac: RRR; no murmurs, rubs, or gallops,no edema  Respiratory:  clear to auscultation bilaterally, normal work of breathing GI: soft, nontender, nondistended, + BS MS: no deformity or atrophy  Skin: warm and dry, device site well healed Neuro:  Strength and sensation are intact Psych: euthymic mood, full affect  EKG:  EKG is ordered today. Personal review of the ekg ordered shows sinus rhythm, rate 71  Personal review of the  device interrogation today. Results in [place]: 05/27/2021: B Natriuretic Peptide 159.2 10/09/2021: ALT 30; BUN 26; Creatinine 1.26; Hemoglobin 13.1; Platelet Count 172; Potassium 4.1; Sodium 141    Lipid Panel     Component Value Date/Time   CHOL 156 09/03/2021 0749   TRIG 92 09/03/2021 0749   HDL 61 09/03/2021 0749   CHOLHDL 2.6 09/03/2021 0749   CHOLHDL 3.2 02/22/2019 0757   VLDL 23 02/22/2019 0757   LDLCALC 78 09/03/2021 0749     Wt Readings from Last 3 Encounters:  01/02/22 217 lb 12.8 oz (98.8 kg)  12/07/21 220 lb 9.6 oz (100.1 kg)  10/09/21 220 lb 3.2 oz (99.9 kg)      Other studies Reviewed: Additional studies/ records that were reviewed today include: TTE 11/08/21 Review of the above records today demonstrates:   1. Left ventricular ejection fraction, by estimation, is 50 to 55%. The  left ventricle has normal function. The left ventricle has no regional  wall motion abnormalities. Left ventricular diastolic function could not  be evaluated.   2. Right ventricular systolic function is normal. The right ventricular  size is normal. There is normal pulmonary artery systolic pressure.   3. Left atrial size was severely dilated.   4. Right atrial size was severely dilated.   5. The mitral valve has been repaired/replaced. No evidence of mitral  valve regurgitation. No evidence of mitral stenosis. Procedure Date: 2002.   6. The aortic valve is tricuspid. Aortic valve regurgitation is not  visualized. Aortic valve sclerosis is present, with no evidence of aortic  valve stenosis.   7. There is moderate dilatation of the aortic root, measuring 40 mm.  There is moderate dilatation of the ascending aorta, measuring 44 mm.   8. The inferior vena cava is normal in size with greater than 50%  respiratory variability, suggesting right atrial pressure of 3 mmHg.    ASSESSMENT AND PLAN:  1.  Paroxysmal atrial fibrillation/atypical atrial flutter: Currently on  Eliquis 5 mg twice daily.  CHA2DS2-VASc of 4.  Remained in sinus rhythm over the last few months.  We Suzi Hernan continue with current management.  2.  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  Stable on most  recent echo.  Plan per primary cardiology.  3.  Abdominal aortic aneurysms: Post coiling.  Has follow-up with vascular surgery.  4.  Secondary hypercoagulable state: Currently on Eliquis for atrial fibrillation as above.    Current medicines are reviewed at length with the patient today.   The patient does not have concerns regarding his medicines.  The following changes were made today: None  Labs/ tests ordered today include:  Orders Placed This Encounter  Procedures   EKG 12-Lead     Disposition:   FU 12 months  Signed, Annarose Ouellet Sean Leeds, MD  01/02/2022 2:34 PM     [address] [place] [address] [phone] (office) [phone] (fax)

## 2022-01-07 ENCOUNTER — Other Ambulatory Visit (HOSPITAL_BASED_OUTPATIENT_CLINIC_OR_DEPARTMENT_OTHER): Payer: Self-pay

## 2022-01-07 ENCOUNTER — Encounter: Payer: Self-pay | Admitting: Hematology and Oncology

## 2022-01-07 DIAGNOSIS — Z23 Encounter for immunization: Secondary | ICD-10-CM | POA: Diagnosis not present

## 2022-01-07 MED ORDER — INFLUENZA VAC A&B SA ADJ QUAD 0.5 ML IM PRSY
PREFILLED_SYRINGE | INTRAMUSCULAR | 0 refills | Status: DC
  Filled 2022-01-07: qty 0.5, 1d supply, fill #0

## 2022-01-07 NOTE — Progress Notes (Signed)
[facility] Summary Report / Loop Recorder 

## 2022-01-09 ENCOUNTER — Encounter: Payer: Self-pay | Admitting: Hematology and Oncology

## 2022-01-09 ENCOUNTER — Ambulatory Visit
Admission: RE | Admit: 2022-01-09 | Discharge: 2022-01-09 | Disposition: A | Discharge status: Home / Self Care | Payer: Self-pay | Source: Ambulatory Visit | Attending: Hematology and Oncology | Admitting: Hematology and Oncology

## 2022-01-09 ENCOUNTER — Other Ambulatory Visit: Payer: Self-pay

## 2022-01-09 DIAGNOSIS — C73 Malignant neoplasm of thyroid gland: Secondary | ICD-10-CM

## 2022-01-09 DIAGNOSIS — C8318 Mantle cell lymphoma, lymph nodes of multiple sites: Secondary | ICD-10-CM

## 2022-01-10 ENCOUNTER — Telehealth: Payer: Self-pay

## 2022-01-10 NOTE — Telephone Encounter (Signed)
Called and schedule appt for 10/24 with wife/ Dan in the background. They are aware of appts.

## 2022-01-15 ENCOUNTER — Inpatient Hospital Stay (HOSPITAL_BASED_OUTPATIENT_CLINIC_OR_DEPARTMENT_OTHER): Payer: Medicare Other | Admitting: Hematology and Oncology

## 2022-01-15 ENCOUNTER — Inpatient Hospital Stay: Payer: Medicare Other | Attending: Hematology and Oncology

## 2022-01-15 DIAGNOSIS — Z79899 Other long term (current) drug therapy: Secondary | ICD-10-CM | POA: Insufficient documentation

## 2022-01-15 DIAGNOSIS — C8310 Mantle cell lymphoma, unspecified site: Secondary | ICD-10-CM | POA: Diagnosis not present

## 2022-01-15 DIAGNOSIS — Z79890 Hormone replacement therapy (postmenopausal): Secondary | ICD-10-CM | POA: Diagnosis not present

## 2022-01-15 DIAGNOSIS — C8318 Mantle cell lymphoma, lymph nodes of multiple sites: Secondary | ICD-10-CM

## 2022-01-15 DIAGNOSIS — I700 Atherosclerosis of aorta: Secondary | ICD-10-CM | POA: Diagnosis not present

## 2022-01-15 DIAGNOSIS — I7121 Aneurysm of the ascending aorta, without rupture: Secondary | ICD-10-CM

## 2022-01-15 DIAGNOSIS — N200 Calculus of kidney: Secondary | ICD-10-CM | POA: Insufficient documentation

## 2022-01-15 DIAGNOSIS — N400 Enlarged prostate without lower urinary tract symptoms: Secondary | ICD-10-CM | POA: Insufficient documentation

## 2022-01-15 DIAGNOSIS — C73 Malignant neoplasm of thyroid gland: Secondary | ICD-10-CM

## 2022-01-15 DIAGNOSIS — Z7901 Long term (current) use of anticoagulants: Secondary | ICD-10-CM | POA: Insufficient documentation

## 2022-01-15 DIAGNOSIS — Z85850 Personal history of malignant neoplasm of thyroid: Secondary | ICD-10-CM | POA: Insufficient documentation

## 2022-01-15 DIAGNOSIS — K5730 Diverticulosis of large intestine without perforation or abscess without bleeding: Secondary | ICD-10-CM | POA: Insufficient documentation

## 2022-01-15 DIAGNOSIS — I2510 Atherosclerotic heart disease of native coronary artery without angina pectoris: Secondary | ICD-10-CM | POA: Insufficient documentation

## 2022-01-15 LAB — CBC WITH DIFFERENTIAL (CANCER CENTER ONLY)
Abs Immature Granulocytes: 0.02 10*3/uL (ref 0.00–0.07)
Basophils Absolute: 0.1 10*3/uL (ref 0.0–0.1)
Basophils Relative: 1 %
Eosinophils Absolute: 0.2 10*3/uL (ref 0.0–0.5)
Eosinophils Relative: 4 %
HCT: 38 % — ABNORMAL LOW (ref 39.0–52.0)
Hemoglobin: 12.6 g/dL — ABNORMAL LOW (ref 13.0–17.0)
Immature Granulocytes: 0 %
Lymphocytes Relative: 19 %
Lymphs Abs: 1.2 10*3/uL (ref 0.7–4.0)
MCH: 30.7 pg (ref 26.0–34.0)
MCHC: 33.2 g/dL (ref 30.0–36.0)
MCV: 92.7 fL (ref 80.0–100.0)
Monocytes Absolute: 0.8 10*3/uL (ref 0.1–1.0)
Monocytes Relative: 13 %
Neutro Abs: 3.9 10*3/uL (ref 1.7–7.7)
Neutrophils Relative %: 63 %
Platelet Count: 164 10*3/uL (ref 150–400)
RBC: 4.1 MIL/uL — ABNORMAL LOW (ref 4.22–5.81)
RDW: 13 % (ref 11.5–15.5)
WBC Count: 6.1 10*3/uL (ref 4.0–10.5)
nRBC: 0 % (ref 0.0–0.2)

## 2022-01-15 LAB — CMP (CANCER CENTER ONLY)
ALT: 19 U/L (ref 0–44)
AST: 20 U/L (ref 15–41)
Albumin: 4.2 g/dL (ref 3.5–5.0)
Alkaline Phosphatase: 56 U/L (ref 38–126)
Anion gap: 3 — ABNORMAL LOW (ref 5–15)
BUN: 30 mg/dL — ABNORMAL HIGH (ref 8–23)
CO2: 31 mmol/L (ref 22–32)
Calcium: 9.1 mg/dL (ref 8.9–10.3)
Chloride: 106 mmol/L (ref 98–111)
Creatinine: 1.21 mg/dL (ref 0.61–1.24)
GFR, Estimated: >60 mL/min (ref >60)
Glucose, Bld: 79 mg/dL (ref 70–99)
Potassium: 4.9 mmol/L (ref 3.5–5.1)
Sodium: 140 mmol/L (ref 135–145)
Total Bilirubin: 0.9 mg/dL (ref 0.3–1.2)
Total Protein: 6.1 g/dL — ABNORMAL LOW (ref 6.5–8.1)

## 2022-01-16 ENCOUNTER — Encounter: Payer: Self-pay | Admitting: Hematology and Oncology

## 2022-01-16 NOTE — Progress Notes (Signed)
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   All questions were answered. The patient knows to call the clinic with any problems, questions or concerns. The total time spent in the appointment was 30 minutes encounter with patients including review of chart and various tests results, discussions about plan of care and coordination of care plan   Heath Lark, MD 01/16/2022 8:46 AM  INTERVAL HISTORY: Please see below for problem oriented charting. he returns for urgent evaluation He has CT imaging done at [facility] for follow up on his aneurysm. CT report suggested new supraclavicular, lower cervical LN on the left He did not feel any enlarged LN on self palpation No recent upper respiratory infection/symptoms He has chronic neck discomfort We spent a great amount of time on exam, and on review on downloaded imaging studies from [place] from 2022, 2023 and prior CT imaging of the neck from Jan 2023  REVIEW OF SYSTEMS:   Constitutional: Denies fevers, chills or abnormal weight loss Eyes: Denies blurriness of vision Ears, nose, mouth, throat, and face: Denies mucositis or sore  throat Respiratory: Denies cough, dyspnea or wheezes Cardiovascular: Denies palpitation, chest discomfort or lower extremity swelling Gastrointestinal:  Denies nausea, heartburn or change in bowel habits Skin: Denies abnormal skin rashes Lymphatics: Denies new lymphadenopathy or easy bruising Neurological:Denies numbness, tingling or new weaknesses Behavioral/Psych: Mood is stable, no new changes  All other systems were reviewed with the patient and are negative.  I have reviewed the past medical history, past surgical history, social history and family history with the patient and they are unchanged from previous note.  ALLERGIES:  is allergic to levaquin [levofloxacin in d5w], nickel, other, pravastatin, simvastatin, statins, allopurinol, ampicillin, and penicillin g.  MEDICATIONS:  Current Outpatient Medications  Medication Sig Dispense Refill   acetaminophen (TYLENOL) 500 MG tablet Take 1,000 mg by mouth daily as needed for moderate pain.     alfuzosin (UROXATRAL) 10 MG 24 hr tablet Take 10 mg by mouth at bedtime.      apixaban (ELIQUIS) 5 MG TABS tablet Take 1 tablet (5 mg total) by mouth 2 (two) times daily. 60 tablet 3   buPROPion (WELLBUTRIN XL) 150 MG 24 hr tablet Take 1 tablet (150 mg total) by mouth daily. 90 tablet 3   Cholecalciferol (VITAMIN D) 2000 units tablet Take 2,000 Units by mouth at bedtime.      ezetimibe (ZETIA) 10 MG tablet Take 1 tablet (10 mg total) by mouth every morning. 90 tablet 2   finasteride (PROSCAR) 5 MG tablet Take 5 mg by mouth daily with supper.     influenza vaccine adjuvanted (FLUAD) 0.5 ML injection Inject into the muscle. 0.5 mL 0   levothyroxine (SYNTHROID)  175 MCG tablet Take 1 tablet (175 mcg total) by mouth daily.     lidocaine-prilocaine (EMLA) cream Apply to affected area once before sleep time- 30 g 3   metoprolol succinate (TOPROL-XL) 25 MG 24 hr tablet Take 12.5 mg by mouth daily.     Polyethyl Glycol-Propyl Glycol (SYSTANE HYDRATION PF  OP) Place 1 drop into both eyes 3 (three) times daily.     rosuvastatin (CRESTOR) 5 MG tablet Take 1 tablet (5 mg total) by mouth every Monday, Wednesday, and Friday. 36 tablet 3   No current facility-administered medications for this visit.    SUMMARY OF ONCOLOGIC HISTORY: Oncology History Overview Note  MIPI score 7.5 high risk   Mantle cell lymphoma ([place])  06/13/2017 Imaging   Ct imaging at [facility] 1. 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 2. 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 3. Slight reduction in size of right retroperitoneal evolving hematoma.   03/30/2018 Imaging   Ct neck 1. Extensive adenopathy on the left. The largest node is a level 1/submandibular node measuring 38 x 24 x 22 mm. Largest level 2 node measures 3.2 x 2 x 2 cm. Numerous other smaller but round lymph nodes throughout the left neck in the level 2 through level 4 region. The largest level 5 node measures 3.4 x 3.4 x 2.3 cm with a transverse diameter of 2.3 cm. This contains some internal calcifications. There are numerous other pathologic nodes in the left supraclavicular to axillary region. This pattern of disease could be due to extensive left neck metastatic disease from unknown primary, lymphoma, or other systemic malignancy. 2. No evidence of mucosal or submucosal lesion. 3. Diameter of the ascending aorta is 4.5 cm. Recommend annual imaging followup by CTA or MRA. Aortic Atherosclerosis (ICD10-I70.0).     04/10/2018 Pathology Results   Left zone 1 neck mass, Fine Needle Aspiration I (smears and cell block):      Atypical lymphoid proliferation. See comment.       Specimen Adequacy:  Satisfactory for evaluation.  COMMENT:The aspirate demonstrates abundant small to medium sized lymphocytes with scattered epithelioid cells in the  background which may be histiocytes.  The smears are cellular, but the cell block includes scant lymphocytes.  Immunohistochemical stains were attempted showing positive staining for CD20 with rare staining for CD3.  Cytokeratin AE1/AE3 is negative. S100 shows high nonspecific background staining but no specifically diagnostic cellular staining.  Overall, the findings indicate an atypical lymphoid proliferation but are too limited for definitive diagnosis.  Excisional biopsy with flow cytometry is recommended.   04/10/2018 Procedure   He was ENT who performed FNA of neck LN   04/27/2018 Pathology Results   Lymph node for lymphoma, [name], [name], [name], [name], [name], [name], [name], the features are consistent with a mantle cell lymphoma   05/13/2018 PET scan  1. Hypermetabolic left cervical lymphadenopathy, left axillary lymphadenopathy, mediastinal / right hilar lymphadenopathy, right external iliac lymphadenopathy, and subcentimeter left external iliac and left inguinal lymph nodes, [name], [name], [name], without focal lesions, that may represent lymphomatous involvement of the spleen.   05/21/2018 Cancer Staging   Staging form: Hodgkin and  Non-Hodgkin Lymphoma, AJCC 8th Edition - Clinical: Stage III - Signed by Heath Lark, MD on 05/21/2018   05/26/2018 Procedure   Ultrasound and fluoroscopically guided right internal jugular single lumen power port catheter insertion. Tip in the SVC/RA junction. Catheter ready for use.     06/01/2018 -  Chemotherapy   The patient had Bendamustine and Rituximab for chemotherapy treatment   08/24/2018 PET scan   1. Partial response to therapy with complete resolution of size and metabolic activity of the dominant LEFT submandibular node (level 1) and LEFT axillary nodes. 2. Persistent and increased metabolic activity of left level 3 lymph nodes ( [place] 4). Nodes are partially calcified and similar size. 3. No new metastatic disease. 4. Normal spleen and bone marrow.   11/09/2018 PET scan   IMPRESSION: 1. Mild response to therapy of hypermetabolic left cervical nodes. ([place]) 4. 2. No new or progressive disease. 3. Coronary artery atherosclerosis. Aortic Atherosclerosis (ICD10-I70.0). 4. Trace cul-de-sac fluid, similar.   12/18/2018 Imaging   1. 4 mm solid nodule in the right lower lobe, which is technically indeterminate. Recommend attention on follow-up per clinical protocol. 2. Small calcified nodule adjacent to the left lobe of the thyroid, which may represent thyroid nodule or calcified lymph node. Recommend correlation with thyroid ultrasound. 3. The ascending aorta appears mildly dilated, measuring up to 4.2cm. 4. Decreased left axillary lymphadenopathy.   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   Stranding with postsurgical changes along the right anterior neck anterior to the thyroid cartilage, favored to be related to recent surgery. Attention on follow-up is suggested.   No suspicious lymphadenopathy in the neck, chest, abdomen, or pelvis. Deauville criteria 1   11/22/2019 Imaging    1. No evidence of recurrent lymphoma in the chest, abdomen or pelvis. Normal size spleen. 2. 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 Recommend annual imaging followup by CTA or MRA. This recommendation follows 2010 CCF/AHA/AATS/ACR/ASA/SCA/SCAI/SIR/STS/SVM Guidelines for the Diagnosis and Management of Patients with Thoracic Aortic Disease. Circulation. 2010; 121: Z610-R604. Aortic aneurysm NOS (ICD10-I71.9). Moderate diffuse colonic diverticulosis. Mild prostatomegaly. Aortic Atherosclerosis (ICD10-I70.0).   05/30/2020 Imaging   Stable exam. No evidence of recurrent lymphoma or metastatic disease within the chest, abdomen, or pelvis.   Colonic diverticulosis, without radiographic evidence of diverticulitis.   Tiny nonobstructing left renal calculus.   Stable mildly enlarged prostate.   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   Aortic and coronary atherosclerotic calcification.   02/05/2021 Procedure   Removal of implanted Port-A-Cath utilizing sharp and blunt dissection. The procedure was uncomplicated.   04/11/2021 Imaging   1. No evidence of mass or lymphadenopathy in the chest, abdomen, or pelvis. 2. Normal spleen. 3. Unchanged enlargement of the tubular ascending thoracic aorta, measuring up to 4.4 x 4.4 cm.  4. Cardiomegaly and coronary artery disease.     Thyroid cancer ([place])  11/23/2018 Pathology Results   Lymph node for lymphoma, Left zone 3 - PAPILLARY THYROID CARCINOMA. -  SEE MICROSCOPIC DESCRIPTION. Microscopic Comment The specimen consists mostly of encapsulated papillary thyroid carcinoma. There are portions of lymphoid tissue attached to the periphery of the specimen and in the adjacent adipose tissue which suggests that this may represent papillary carcinoma metastatic to a lymph node.   11/23/2018 Surgery   PREOPERATIVE DIAGNOSIS:  Mantle cell lymphoma and cervical lymphadenopathy.   POSTOPERATIVE DIAGNOSIS:  Mantle cell lymphoma  and cervical lymphadenopathy.   PROCEDURE:  Excisional biopsy of left cervical lymph nodes.     01/15/2019 Pathology Results   A. SOFT TISSUE, NECK, ADJACENT TO LEFT RECURRENT NERVE, BIOPSY: - Papillary thyroid carcinoma. B. 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 C. ADDITIONAL LEFT LEVEL 4 TISSUE, EXCISION: - There is no evidence of carcinoma in 2 of 2 lymph nodes (0/2). D. TOTAL THYROIDECTOMY: - Papillary thyroid carcinoma, 0.8 cm. - Carcinoma is broadly present at an inked tissue edge. - See oncology table below. E. LEFT LEVEL 6 TISSUE, EXCISION: - Benign fibroadipose tissue. - There is no evidence of malignancy. F. LEFT RECURRENT NERVE AND SURROUNDING TISSUE, EXCISION: - Metastatic papillary thyroid carcinoma in 3 of 3 lymph nodes (3/3).  THYROID GLAND: Procedure: Thyroidectomy Tumor Focality: Unifocal Tumor Site: Left lobe Tumor Size: 0.8 cm Histologic Type: 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Margins: Carcinoma is broadly present at a black inked tissue edge. 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   PROCEDURE:  Total thyroidectomy and left modified radical neck dissection.   SURGEON:  Christia Reading, MD     FINDINGS:  There was a firm nodule in zone IIB on the left side and also some firm tissue and adherence to  the jugular vein in zone III region requiring removal of a portion of the vein.  There was a firm nodule in the left thyroid lobe that extended posterior to the gland, adhering to the trachea, and encompassing the recurrent laryngeal nerve.  Tissue was sent for frozen section from adjacent to the nerve, demonstrating papillary carcinoma.  The nerve stimulated distal to the mass but not proximal to the mass; thus, the nerve was removed with the mass.  The left-sided parathyroid glands were visualized, as was the right superior parathyroid gland.  The right recurrent nerve was kept safe and stimulated well at the end of the case.   05/30/2020 Imaging   Negative for recurrent mass or adenopathy in the neck   Left vocal cord paresis.   04/03/2021 Cancer Staging   Staging form: Thyroid - Differentiated, AJCC 8th Edition - Pathologic stage from 04/03/2021: pT1a, pN1, cM0 - Signed by Artis Delay, MD on 04/03/2021 Lymph node metastasis: Present   04/11/2021 Imaging   No change from the prior study.  No recurrent mass or adenopathy   Postop changes left neck, stable   Chronic paresis left vocal cord unchanged.     PHYSICAL EXAMINATION: ECOG PERFORMANCE STATUS: 1 - Symptomatic but completely ambulatory  Vitals:   01/15/22 1400  BP: 112/64  Pulse: 65  Resp: 18  SpO2: 97%   Filed Weights   01/15/22 1400  Weight: 219 lb 12.8 oz (99.7 kg)    GENERAL:alert, no distress and comfortable SKIN: skin color, texture, turgor are normal, no rashes or significant lesions EYES: normal, Conjunctiva are pink and non-injected, sclera clear OROPHARYNX:no exudate, no erythema and lips, buccal mucosa, and tongue normal  NECK: supple, thyroid normal size, non-tender, without nodularity LYMPH:  no palpable lymphadenopathy in the cervical, axillary or inguinal LUNGS: clear to auscultation and percussion with normal breathing effort HEART: regular rate & rhythm and no murmurs and no lower extremity  edema ABDOMEN:abdomen soft, non-tender and normal bowel sounds Musculoskeletal:no cyanosis of digits and no clubbing  NEURO: alert & oriented x 3 with chronic hoarseness, no focal motor/sensory deficits  LABORATORY DATA:  I have reviewed the data as listed    Component Value Date/Time   NA 140 01/15/2022 1353   K 4.9 01/15/2022 1353   CL 106 01/15/2022 1353   CO2 31 01/15/2022 1353   GLUCOSE 79 01/15/2022 1353   BUN 30 (H) 01/15/2022 1353   CREATININE 1.21 01/15/2022 1353   CALCIUM 9.1 01/15/2022 1353   PROT 6.1 (L) 01/15/2022 1353   PROT 6.0 09/03/2021 0749   ALBUMIN 4.2 01/15/2022 1353   ALBUMIN 4.3 09/03/2021 0749   AST 20 01/15/2022 1353   ALT 19 01/15/2022 1353   ALKPHOS 56 01/15/2022 1353   BILITOT 0.9 01/15/2022 1353   GFRNONAA >60 01/15/2022 1353   GFRAA >60 11/22/2019 1023    No results found for: "SPEP", "UPEP"  Lab Results  Component Value Date   WBC 6.1 01/15/2022   NEUTROABS 3.9 01/15/2022   HGB 12.6 (L) 01/15/2022   HCT 38.0 (L) 01/15/2022   MCV 92.7 01/15/2022   PLT 164 01/15/2022      Chemistry      Component Value Date/Time   NA 140 01/15/2022 1353   K 4.9 01/15/2022 1353   CL 106 01/15/2022 1353   CO2 31 01/15/2022 1353   BUN 30 (H) 01/15/2022 1353   CREATININE 1.21 01/15/2022 1353      Component Value Date/Time   CALCIUM 9.1 01/15/2022 1353   ALKPHOS 56 01/15/2022 1353   AST 20 01/15/2022 1353   ALT 19 01/15/2022 1353   BILITOT 0.9 01/15/2022 1353       RADIOGRAPHIC STUDIES: I have reviewed multiple imaging studies with the patient today I have personally reviewed the radiological images as listed and agreed with the findings in the report.

## 2022-01-16 NOTE — Assessment & Plan Note (Signed)
He has persistent neck discomfort due to prior surgery He has no clinical signs of recurrence 

## 2022-01-16 NOTE — Assessment & Plan Note (Signed)
The "abnormal" lymph node noted near the supraclavicular region on his outside CT imaging from [place] is not new It is not palpable on exam He has no other lymphadenopathy elsewhere He has no signs of lymphoma recurrence He is reassured

## 2022-01-16 NOTE — Assessment & Plan Note (Signed)
This is stable per outside CT imaging

## 2022-01-17 ENCOUNTER — Encounter: Payer: Self-pay | Admitting: Hematology and Oncology

## 2022-01-23 DIAGNOSIS — Z23 Encounter for immunization: Secondary | ICD-10-CM | POA: Diagnosis not present

## 2022-02-04 ENCOUNTER — Ambulatory Visit (INDEPENDENT_AMBULATORY_CARE_PROVIDER_SITE_OTHER): Payer: Medicare Other

## 2022-02-04 DIAGNOSIS — I480 Paroxysmal atrial fibrillation: Secondary | ICD-10-CM

## 2022-02-05 LAB — CUP PACEART REMOTE DEVICE CHECK
Date Time Interrogation Session: 20231112230126
Implantable Pulse Generator Implant Date: 20230105

## 2022-02-12 DIAGNOSIS — I4891 Unspecified atrial fibrillation: Secondary | ICD-10-CM | POA: Diagnosis not present

## 2022-02-12 DIAGNOSIS — E7800 Pure hypercholesterolemia, unspecified: Secondary | ICD-10-CM | POA: Diagnosis not present

## 2022-02-12 DIAGNOSIS — N400 Enlarged prostate without lower urinary tract symptoms: Secondary | ICD-10-CM | POA: Diagnosis not present

## 2022-02-27 DIAGNOSIS — I4891 Unspecified atrial fibrillation: Secondary | ICD-10-CM | POA: Diagnosis not present

## 2022-02-27 DIAGNOSIS — F418 Other specified anxiety disorders: Secondary | ICD-10-CM | POA: Diagnosis not present

## 2022-02-27 DIAGNOSIS — R002 Palpitations: Secondary | ICD-10-CM | POA: Diagnosis not present

## 2022-02-27 DIAGNOSIS — J383 Other diseases of vocal cords: Secondary | ICD-10-CM | POA: Diagnosis not present

## 2022-02-27 DIAGNOSIS — C73 Malignant neoplasm of thyroid gland: Secondary | ICD-10-CM | POA: Diagnosis not present

## 2022-02-27 DIAGNOSIS — G4700 Insomnia, unspecified: Secondary | ICD-10-CM | POA: Diagnosis not present

## 2022-02-27 DIAGNOSIS — G6289 Other specified polyneuropathies: Secondary | ICD-10-CM | POA: Diagnosis not present

## 2022-03-11 ENCOUNTER — Ambulatory Visit (INDEPENDENT_AMBULATORY_CARE_PROVIDER_SITE_OTHER): Payer: Medicare Other

## 2022-03-11 DIAGNOSIS — I480 Paroxysmal atrial fibrillation: Secondary | ICD-10-CM | POA: Diagnosis not present

## 2022-03-11 NOTE — Progress Notes (Signed)
[facility] Summary Report / Loop Recorder 

## 2022-03-12 LAB — CUP PACEART REMOTE DEVICE CHECK
Date Time Interrogation Session: 20231217230707
Implantable Pulse Generator Implant Date: 20230105

## 2022-03-15 DIAGNOSIS — M25561 Pain in right knee: Secondary | ICD-10-CM | POA: Diagnosis not present

## 2022-03-23 ENCOUNTER — Telehealth: Payer: Self-pay | Admitting: Cardiology

## 2022-03-23 ENCOUNTER — Other Ambulatory Visit: Payer: Self-pay

## 2022-03-23 ENCOUNTER — Emergency Department (HOSPITAL_BASED_OUTPATIENT_CLINIC_OR_DEPARTMENT_OTHER)
Admission: EM | Admit: 2022-03-23 | Discharge: 2022-03-23 | Disposition: A | Discharge status: Home / Self Care | Payer: Medicare Other | Attending: Emergency Medicine | Admitting: Emergency Medicine

## 2022-03-23 ENCOUNTER — Encounter (HOSPITAL_BASED_OUTPATIENT_CLINIC_OR_DEPARTMENT_OTHER): Payer: Self-pay

## 2022-03-23 DIAGNOSIS — Z1152 Encounter for screening for COVID-19: Secondary | ICD-10-CM | POA: Diagnosis not present

## 2022-03-23 DIAGNOSIS — E860 Dehydration: Secondary | ICD-10-CM | POA: Insufficient documentation

## 2022-03-23 DIAGNOSIS — R350 Frequency of micturition: Secondary | ICD-10-CM | POA: Insufficient documentation

## 2022-03-23 DIAGNOSIS — Z7901 Long term (current) use of anticoagulants: Secondary | ICD-10-CM | POA: Diagnosis not present

## 2022-03-23 DIAGNOSIS — Z85850 Personal history of malignant neoplasm of thyroid: Secondary | ICD-10-CM | POA: Diagnosis not present

## 2022-03-23 DIAGNOSIS — N401 Enlarged prostate with lower urinary tract symptoms: Secondary | ICD-10-CM | POA: Insufficient documentation

## 2022-03-23 DIAGNOSIS — R002 Palpitations: Secondary | ICD-10-CM | POA: Diagnosis not present

## 2022-03-23 DIAGNOSIS — I4891 Unspecified atrial fibrillation: Secondary | ICD-10-CM | POA: Insufficient documentation

## 2022-03-23 DIAGNOSIS — Z79899 Other long term (current) drug therapy: Secondary | ICD-10-CM | POA: Insufficient documentation

## 2022-03-23 DIAGNOSIS — R5383 Other fatigue: Secondary | ICD-10-CM | POA: Diagnosis not present

## 2022-03-23 LAB — MAGNESIUM: Magnesium: 2.2 mg/dL (ref 1.7–2.4)

## 2022-03-23 LAB — COMPREHENSIVE METABOLIC PANEL
ALT: 22 U/L (ref 0–44)
AST: 20 U/L (ref 15–41)
Albumin: 4.2 g/dL (ref 3.5–5.0)
Alkaline Phosphatase: 57 U/L (ref 38–126)
Anion gap: 7 (ref 5–15)
BUN: 25 mg/dL — ABNORMAL HIGH (ref 8–23)
CO2: 28 mmol/L (ref 22–32)
Calcium: 8.9 mg/dL (ref 8.9–10.3)
Chloride: 104 mmol/L (ref 98–111)
Creatinine, Ser: 1.17 mg/dL (ref 0.61–1.24)
GFR, Estimated: >60 mL/min (ref >60)
Glucose, Bld: 97 mg/dL (ref 70–99)
Potassium: 4.5 mmol/L (ref 3.5–5.1)
Sodium: 139 mmol/L (ref 135–145)
Total Bilirubin: 2 mg/dL — ABNORMAL HIGH (ref 0.3–1.2)
Total Protein: 6.2 g/dL — ABNORMAL LOW (ref 6.5–8.1)

## 2022-03-23 LAB — CBC WITH DIFFERENTIAL/PLATELET
Abs Immature Granulocytes: 0.04 10*3/uL (ref 0.00–0.07)
Basophils Absolute: 0.1 10*3/uL (ref 0.0–0.1)
Basophils Relative: 1 %
Eosinophils Absolute: 0.1 10*3/uL (ref 0.0–0.5)
Eosinophils Relative: 2 %
HCT: 41.6 % (ref 39.0–52.0)
Hemoglobin: 13.6 g/dL (ref 13.0–17.0)
Immature Granulocytes: 1 %
Lymphocytes Relative: 17 %
Lymphs Abs: 1.1 10*3/uL (ref 0.7–4.0)
MCH: 30.5 pg (ref 26.0–34.0)
MCHC: 32.7 g/dL (ref 30.0–36.0)
MCV: 93.3 fL (ref 80.0–100.0)
Monocytes Absolute: 0.8 10*3/uL (ref 0.1–1.0)
Monocytes Relative: 11 %
Neutro Abs: 4.6 10*3/uL (ref 1.7–7.7)
Neutrophils Relative %: 68 %
Platelets: 165 10*3/uL (ref 150–400)
RBC: 4.46 MIL/uL (ref 4.22–5.81)
RDW: 13.2 % (ref 11.5–15.5)
WBC: 6.7 10*3/uL (ref 4.0–10.5)
nRBC: 0 % (ref 0.0–0.2)

## 2022-03-23 LAB — TSH: TSH: 0.673 u[IU]/mL (ref 0.350–4.500)

## 2022-03-23 LAB — RESP PANEL BY RT-PCR (RSV, FLU A&B, COVID)  RVPGX2
Influenza A by PCR: NEGATIVE
Influenza B by PCR: NEGATIVE
Resp Syncytial Virus by PCR: NEGATIVE
SARS Coronavirus 2 by RT PCR: NEGATIVE

## 2022-03-23 LAB — URINALYSIS, ROUTINE W REFLEX MICROSCOPIC
Bilirubin Urine: NEGATIVE
Glucose, UA: NEGATIVE mg/dL
Hgb urine dipstick: NEGATIVE
Ketones, ur: NEGATIVE mg/dL
Leukocytes,Ua: NEGATIVE
Nitrite: NEGATIVE
Protein, ur: NEGATIVE mg/dL
Specific Gravity, Urine: 1.017 (ref 1.005–1.030)
pH: 5.5 (ref 5.0–8.0)

## 2022-03-23 MED ORDER — SODIUM CHLORIDE 0.9 % IV BOLUS
500.0000 mL | Freq: Once | INTRAVENOUS | Status: AC
  Administered 2022-03-23: 500 mL via INTRAVENOUS

## 2022-03-23 NOTE — ED Notes (Signed)
Pt to bathroom w/no assist, UA cup given

## 2022-03-23 NOTE — Discharge Instructions (Signed)
Your history, exam, workup today was overall reassuring.  I suspect you had a brief episode of arrhythmia given palpitations that resolved after medications.  Your blood pressure was slightly soft but improved after fluids.  Your labs and workup were otherwise reassuring and improved ability for over 4 hours.  We feel you are safe for discharge home to call your doctor to get seen in the neck several days.  If any symptoms change or worsen acutely, please return to the nearest emergency department.

## 2022-03-23 NOTE — ED Provider Notes (Signed)
[facility] EMERGENCY DEPT Provider Note   CSN: [id number] Arrival date & time: 03/23/22  0815     History  Chief Complaint  Patient presents with   Palpitations    Sean Stanley is a 81 y.o. male.  The history is provided by the patient and medical records. No language interpreter was used.  Palpitations Palpitations quality:  Fast Onset quality:  Sudden Duration:  3 hours Timing:  Constant Progression:  Resolved Chronicity:  Recurrent Relieved by:  Nothing Worsened by:  Nothing Ineffective treatments:  None tried Associated symptoms: malaise/fatigue   Associated symptoms: no back pain, no chest pain, no cough, no diaphoresis, no dizziness, no nausea, no near-syncope, no numbness, no shortness of breath, no vomiting and no weakness        Home Medications Prior to Admission medications   Medication Sig Start Date End Date Taking? Authorizing Provider  acetaminophen (TYLENOL) 500 MG tablet Take 1,000 mg by mouth daily as needed for moderate pain.    [provider]  alfuzosin (UROXATRAL) 10 MG 24 hr tablet Take 10 mg by mouth at bedtime.     [provider]  apixaban (ELIQUIS) 5 MG TABS tablet Take 1 tablet (5 mg total) by mouth 2 (two) times daily. 05/28/21   Fenton, Clint R, PA  buPROPion (WELLBUTRIN XL) 150 MG 24 hr tablet Take 1 tablet (150 mg total) by mouth daily. 05/22/21   Dohmeier, Asencion Partridge, MD  Cholecalciferol (VITAMIN D) 2000 units tablet Take 2,000 Units by mouth at bedtime.     [provider]  ezetimibe (ZETIA) 10 MG tablet Take 1 tablet (10 mg total) by mouth every morning. 03/07/21   Freada Bergeron, MD  finasteride (PROSCAR) 5 MG tablet Take 5 mg by mouth daily with supper. 09/23/14   [provider]  influenza vaccine adjuvanted (FLUAD) 0.5 ML injection Inject into the muscle. 01/07/22     levothyroxine (SYNTHROID) 175 MCG tablet Take 1 tablet (175 mcg total) by mouth daily. 05/25/19   Heath Lark, MD   lidocaine-prilocaine (EMLA) cream Apply to affected area once before sleep time- 05/22/21   Dohmeier, Asencion Partridge, MD  metoprolol succinate (TOPROL-XL) 25 MG 24 hr tablet Take 12.5 mg by mouth daily. 06/11/16   [provider]  Polyethyl Glycol-Propyl Glycol (SYSTANE HYDRATION PF OP) Place 1 drop into both eyes 3 (three) times daily.    [provider]  rosuvastatin (CRESTOR) 5 MG tablet Take 1 tablet (5 mg total) by mouth every Monday, Wednesday, and Friday. 09/07/21   Freada Bergeron, MD      Allergies    Levaquin [levofloxacin in d5w], Nickel, Other, Pravastatin, Simvastatin, Statins, Allopurinol, Ampicillin, and Penicillin g    Review of Systems   Review of Systems  Constitutional:  Positive for fatigue and malaise/fatigue. Negative for chills, diaphoresis and fever.  HENT:  Negative for congestion.   Respiratory:  Negative for cough, chest tightness, shortness of breath and wheezing.   Cardiovascular:  Positive for palpitations. Negative for chest pain, leg swelling and near-syncope.  Gastrointestinal:  Negative for constipation, diarrhea, nausea and vomiting.  Genitourinary:  Positive for frequency.  Musculoskeletal:  Negative for back pain and neck pain.  Skin:  Negative for rash.  Neurological:  Negative for dizziness, weakness, light-headedness, numbness and headaches.  Psychiatric/Behavioral:  Negative for agitation and confusion.   All other systems reviewed and are negative.   Physical Exam Updated Vital Signs BP 101/70   Pulse 61   Temp (!) 97  F (36.1 C) (Temporal)   Resp 20   Ht '6\' 1"'$  (1.854 m)   Wt 97.5 kg   SpO2 98%   BMI 28.37 kg/m  Physical Exam Vitals and nursing note reviewed.  Constitutional:      General: He is not in acute distress.    Appearance: He is well-developed. He is not ill-appearing, toxic-appearing or diaphoretic.  HENT:     Head: Normocephalic and atraumatic.     Mouth/Throat:     Mouth: Mucous membranes are dry.  Eyes:      Extraocular Movements: Extraocular movements intact.     Conjunctiva/sclera: Conjunctivae normal.     Pupils: Pupils are equal, round, and reactive to light.  Cardiovascular:     Rate and Rhythm: Normal rate and regular rhythm.     Pulses: Normal pulses.     Heart sounds: Murmur heard.  Pulmonary:     Effort: Pulmonary effort is normal. No respiratory distress.     Breath sounds: Normal breath sounds. No wheezing, rhonchi or rales.  Chest:     Chest wall: No tenderness.  Abdominal:     General: Abdomen is flat.     Palpations: Abdomen is soft.     Tenderness: There is no abdominal tenderness. There is no guarding or rebound.  Musculoskeletal:        General: No swelling or tenderness.     Cervical back: Neck supple. No tenderness.     Right lower leg: No edema.     Left lower leg: No edema.  Skin:    General: Skin is warm and dry.     Capillary Refill: Capillary refill takes less than 2 seconds.     Findings: No erythema.  Neurological:     General: No focal deficit present.     Mental Status: He is alert.  Psychiatric:        Mood and Affect: Mood normal.     ED Results / Procedures / Treatments   Labs (all labs ordered are listed, but only abnormal results are displayed) Labs Reviewed  COMPREHENSIVE METABOLIC PANEL - Abnormal; Notable for the following components:      Result Value   BUN 25 (*)    Total Protein 6.2 (*)    Total Bilirubin 2.0 (*)    All other components within normal limits  RESP PANEL BY RT-PCR (RSV, FLU A&B, COVID)  RVPGX2  URINE CULTURE  CBC WITH DIFFERENTIAL/PLATELET  MAGNESIUM  TSH  URINALYSIS, ROUTINE W REFLEX MICROSCOPIC    EKG EKG Interpretation  Date/Time:  Saturday March 23 2022 08:28:13 EST Ventricular Rate:  65 PR Interval:  180 QRS Duration: 98 QT Interval:  420 QTC Calculation: 436 R Axis:   20 Text Interpretation: Normal sinus rhythm Normal ECG When compared with ECG of 19-Jun-2021 11:14, No significant change was  found when compared to prior, similar appearance. No STEMI Confirmed by Antony Blackbird [phone]) on 03/23/2022 8:57:05 AM  Radiology No results found.  Procedures Procedures    Medications Ordered in ED Medications  sodium chloride 0.9 % bolus 500 mL (0 mLs Intravenous Stopped 03/23/22 1049)    ED Course/ Medical Decision Making/ A&P                           Medical Decision Making Amount and/or Complexity of Data Reviewed Labs: ordered.    Sean Stanley is a 81 y.o. male with a past medical history significant for atrial fibs/flutter  on Eliquis therapy, previous thyroid cancer, appears melanoma, previous lymphoma, thoracic ascending aortic aneurysm, BPH, hypercholesterolemia, mitral valve disease status post repair, previous GI bleeding, pancreatic artery pseudoaneurysm, splenic artery aneurysm, and previous cardioversions who presents with palpitations fatigue, and urinary frequency.  According to patient, he was feeling at his baseline until this morning around 4 AM when he woke up with palpitations.  He says they are very fast.  He had them for several hours and took an earlier dose of his 12.5 mg metoprolol.  He reports that before arrival to the emergency department the symptoms started to improve.  He no longer has palpitations.  He is feeling fatigued and tired and his blood pressure was found to be in the upper 80s at home.  It is in the 12I systolic now.  He reports that today he has had urinary frequency but denies dysuria.  Denies fevers, chills, congestion, cough, nausea, vomiting, constipation, or diarrhea.  Denies any sick contacts to his knowledge but he is at [facility] where there have been some sick residents.  Denies any other complaints and specifically denies any current palpitations or chest pain.  Denies associated diaphoresis or syncope.  EKG on arrival shows sinus rhythm.  No tachycardia.  No STEMI.  On exam, lungs clear.  Chest nontender.  Abdomen nontender.  He  does have a murmur.  Good pulses in extremities.  Legs are nontender nonedematous.  Mucous membranes are dry.  Patient otherwise well-appearing without symptoms.  Clinically I do suspect that patient is dehydrated with his soft pressures we will give some fluids.  He does not appear fluid overloaded.  I suspect he had an episode of A-fib or SVT and then his metoprolol got him out of it and is now sinus.  He is proven stability now for over an hour.  Will continue to monitor him on telemetry.  Will check some screening labs to ensure there are no significant electrolyte abnormalities and due to the frequency will check urinalysis.  Due to the ongoing pandemic and viral illness in the community will check in for viral illness that may have caused this fatigue and contributed to this arrhythmia.  If workup is reassuring and he continues to prove stability and is feeling well, anticipate discharge to follow-up with his PCP.   12:44 PM Patient's workup was overall reassuring.  He improved stability for over 4 hours without significant arrhythmia.  He was slightly bradycardic in the 50s but otherwise completely symptomatic.  He was able to stand up without lightheadedness and was feeling well.  Patient's labs overall reassuring.  I suspect he had a brief episode of arrhythmia that has resolved after medications.  Given his improvement in symptoms and improvement in blood pressure after some fluids I feel is safe to discharge home.  Patient and family agree.  Patient discharged in good condition to follow-up with outpatient cardiology and PCP team.         Final Clinical Impression(s) / ED Diagnoses Final diagnoses:  Palpitations  Dehydration    Rx / DC Orders ED Discharge Orders     None       Clinical Impression: 1. Palpitations   2. Dehydration     Disposition: Discharge  Condition: Good  I have discussed the results, Dx and Tx plan with the pt(& family if present). He/she/they  expressed understanding and agree(s) with the plan. Discharge instructions discussed at great length. Strict return precautions discussed and pt &/or family have verbalized understanding of  the instructions. No further questions at time of discharge.    New Prescriptions   No medications on file    Follow Up: Kathalene Frames, MD [address], [address] [place] [address] [phone]     your cardiologist     [facility] [place] [address] [phone]       Quanika Solem, Gwenyth Allegra, MD 03/23/22 1246

## 2022-03-23 NOTE — ED Triage Notes (Signed)
He c/o palpitations at ~0400 today, at which time he took an extra dose of 12.'5mg'$  of metoprolol. EKG shows nsr without ectopy at this time.

## 2022-03-23 NOTE — Telephone Encounter (Signed)
Patient's wife called in reporting the patient went into Afib this morning around 5am. Attempted to take additional metoprolol 12.'5mg'$  BID, last HR 105 BP 88/62. Reports feeling terrible. Denies any infectious, viral symptoms. Given his symptoms and low BP I instructed he would need to be evaluated in the ED. Possible DCCV?, as medication options are very limited. Currently on Eliquis '5mg'$  BID. Wife voiced understanding and thanked me for callback.

## 2022-03-24 LAB — URINE CULTURE: Culture: <10000 — AB

## 2022-03-28 ENCOUNTER — Ambulatory Visit: Payer: Medicare Other | Admitting: Cardiology

## 2022-03-28 NOTE — Progress Notes (Deleted)
Cardiology Office Note Date:  03/28/2022  Patient ID:  Sean Stanley, Sean Stanley 01/16/1941, MRN [id number] PCP:  Kathalene Frames, MD  Cardiologist:  Lolita Patella, MD Electrophysiologist: Constance Haw, MD  ***refresh   Chief Complaint: ***  History of Present Illness: Sean Stanley is a 82 y.o. male with EP PMH notable for Aflutter, Afib, aortic aneurysm, OSA, s/p miniMVR ([facility], 12/2000); seen today for [name], MD for post hospital follow up.    He has a very complicated cardiac and ongology PMH. He has been followed by Dr. Edwin Dada and cardiology at [facility].  The patient underwent minimally invasive mitral valve repair in October 2002 at [facility] and has been followed there since then.  He was briefly seen at [facility] for episode of atrial flutter followed by cardioversion in 2014.  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 In May 2015 he underwent atrial flutter ablation.    He has also been followed by Dr. 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 He is currently in remission for both of his cancers. He has residual vocal cord dysfunction.   Saw Dr. Meda Coffee on 03/27/20, the patient was stable from CV standpoint. He underwent [facility]/2019 that showed mild left ventricle dysfunction with LVEF of 45 to 50%, global longitudinal strain -12.8%, moderate concentric left ventricular hypertrophy, normal right ventricular systolic function, status post mitral valve repair with prosthetic mitral valve ring in place, with mild residual mitral regurgitation, moderately dilated left atrium, trivial tricuspid regurgitation, normal size of the right atrium and  normal right-sided pressures.  Saw Dr. Rayann Heman 03/2021. He requested loop at that time.   Presented to ER in 05/2021 with rapid HR found to be in atypical Aflutter that did not respond to IV metop. Seen in Afib clinic on 05/28/21 and was resumed on apixaban and recommended for DCCV. He had cardioversion on 06/19/21 with return to NSR.   Saw Dr. Curt Bears in 06/2021 where he remained in NSR and recommended for continued medical therapy.  Repeat TTE 10/2021 with LVEF 50-55%, normal RV, severe biatrial enlargement, normal functioning MVR with no MS/MR, mild aortic root and ascending aortic dilation at 2m and 424mrespectively. He last saw Dr. CaCurt Bearsn 12/2021, doing well with few Afib episodes for previous several months.   Presented to ER 03/23/2022 c/o palpitaiton, fatigue x several hours. Systolics BP upper 8062UTook an extra metop without improvement. While en route to ER, symptoms started to improve. BP in ER in 9063Fystolic, was given fluids.   Since discharge from hospital the patient reports doing ***.  he denies chest pain, palpitations, dyspnea, PND, orthopnea, nausea, vomiting, dizziness, syncope, edema, weight gain, or early satiety.       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 [facility])    [facility]   Aneurysm of splenic artery [facility]) 2014   s/p coiling 12/16/12 - [facility]   Atrial flutter (HCC)    BPH (benign prostatic hyperplasia)    Cancer ([place])    melanoma on lower right back and left chest - surgically removed and cleared   Difficult  intubation    Dysrhythmia    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 Maxwell Caul told him he had improved   Pneumonia    Thoracic ascending aortic aneurysm (HCC)    4.5 cm 03/2018 CT. 4.4 cm on echo 10/2021 and on CTA 03/2021   Thyroid cancer [facility])     Past Surgical History:  Procedure Laterality Date   ABDOMINAL ANGIOGRAM  08/05/2012   aneurysm repair     CARDIOVERSION  02/12/2012   Procedure: CARDIOVERSION;  Surgeon: Pixie Casino, MD;  Location: [place];  Service: Cardiovascular;  Laterality: N/A;   CARDIOVERSION N/A 06/22/2013   Procedure: CARDIOVERSION;  Surgeon: [name], MD;  Location: [place];  Service: Cardiovascular;  Laterality: N/A;   CARDIOVERSION N/A 06/19/2021   Procedure: CARDIOVERSION;  Surgeon: Werner Lean, MD;  Location: [place];  Service: Cardiovascular;  Laterality: N/A;   CATARACT EXTRACTION Bilateral 2018   with lens implant   CHOLECYSTECTOMY     COLONOSCOPY     ESOPHAGOGASTRODUODENOSCOPY     implantable loop recorder placement  03/29/2021   Medtronic Reveal Linq model LNQ [id number] G) implantable loop recorder   IR IMAGING GUIDED PORT INSERTION  05/26/2018   IR REMOVAL TUN ACCESS W/ PORT W/O FL MOD SED  02/05/2021   LYMPH NODE BIOPSY Left 04/27/2018   Procedure: EXCISIONAL BIOPSY OF LEFT CERVICAL LYMPH NODE;  Surgeon: Melida Quitter, MD;  Location: [place];  Service: ENT;  Laterality: Left;   LYMPH NODE BIOPSY Left 11/23/2018   Procedure: LEFT CERVICAL LYMPH NODE OPEN BIOPSY;  Surgeon: Melida Quitter, MD;  Location: [place];  Service: ENT;  Laterality: Left;    MENISCUS REPAIR Right 2009   MITRAL VALVE REPAIR  2002   [facility]   NM MYOVIEW LTD  07/22/2006   no ischemia   RADICAL NECK DISSECTION Left 01/15/2019   Procedure: LEFT NECK DISSECTION;  Surgeon: Melida Quitter, MD;  Location: [place];  Service: ENT;  Laterality: Left;   RIGHT HEART CATH  06/19/2004   normal right heart dynamics. EF 50%   TEE WITHOUT CARDIOVERSION  02/12/2012   Procedure: TRANSESOPHAGEAL ECHOCARDIOGRAM (TEE);  Surgeon: Pixie Casino, MD;  Location: [facility];  Service: Cardiovascular;  Laterality: N/A;   TEE WITHOUT CARDIOVERSION N/A 06/22/2013   Procedure: TRANSESOPHAGEAL ECHOCARDIOGRAM (TEE);  Surgeon: [name], MD;  Location: [place];  Service: Cardiovascular;  Laterality: N/A;   THYROIDECTOMY N/A 01/15/2019   Procedure: TOTAL THYROIDECTOMY;  Surgeon: Melida Quitter, MD;  Location: [facility] OR;  Service: ENT;  Laterality: N/A;    Current Outpatient Medications  Medication Sig Dispense Refill   acetaminophen (TYLENOL) 500 MG  tablet Take 1,000 mg by mouth daily as needed for moderate pain.     alfuzosin (UROXATRAL) 10 MG 24 hr tablet Take 10 mg by mouth at bedtime.      apixaban (ELIQUIS) 5 MG TABS tablet Take 1 tablet (5 mg total) by mouth 2 (two) times daily. 60 tablet 3   buPROPion (WELLBUTRIN XL) 150 MG 24 hr tablet Take 1 tablet (150 mg total) by mouth daily. 90 tablet 3   Cholecalciferol (VITAMIN D) 2000 units tablet Take 2,000 Units by mouth at bedtime.      ezetimibe (ZETIA) 10 MG tablet Take 1 tablet (10 mg total) by mouth every morning. 90 tablet 2   finasteride (PROSCAR) 5 MG tablet Take 5 mg by mouth daily with supper.     influenza vaccine adjuvanted (FLUAD) 0.5 ML injection Inject into the muscle. 0.5 mL 0   levothyroxine (SYNTHROID) 175 MCG tablet Take 1 tablet (175 mcg total) by mouth daily.     lidocaine-prilocaine (EMLA) cream Apply to affected area once before sleep time- 30 g 3   metoprolol succinate (TOPROL-XL) 25 MG 24 hr tablet Take 12.5 mg  by mouth daily.     Polyethyl Glycol-Propyl Glycol (SYSTANE HYDRATION PF OP) Place 1 drop into both eyes 3 (three) times daily.     rosuvastatin (CRESTOR) 5 MG tablet Take 1 tablet (5 mg total) by mouth every Monday, Wednesday, and Friday. 36 tablet 3   No current facility-administered medications for this visit.    Allergies:   Levaquin [levofloxacin in d5w], Nickel, Other, Pravastatin, Simvastatin, Statins, Allopurinol, Ampicillin, and Penicillin g   Social History:  The patient  reports that he has never smoked. He has never used smokeless tobacco. He reports that he does not currently use alcohol. He reports that he does not use drugs.   Family History:  The patient's family history includes Cancer in his maternal grandmother; Heart attack in his maternal grandfather, paternal grandfather, and paternal grandmother; Heart failure (age of onset: 68) in his father; Parkinson's disease (age of onset: 22) in his mother.***  ROS:  Please see the history of present illness. All other systems are reviewed and otherwise negative.   PHYSICAL EXAM: *** VS:  There were no vitals taken for this visit. BMI: There is no height or weight on file to calculate BMI.  GEN- The patient is well appearing, alert and oriented x 3 today.   HEENT: normocephalic, atraumatic; sclera clear, conjunctiva pink; hearing intact; oropharynx clear; neck supple, no JVP Lungs- Clear to ausculation bilaterally, normal work of breathing.  No wheezes, rales, rhonchi Heart- {Blank single:19197::"Regular","Irregularly irregular"} rate and rhythm, no murmurs, rubs or gallops, PMI not laterally displaced GI- soft, non-tender, non-distended, bowel sounds present, no hepatosplenomegaly Extremities- {EDEMA XVQMG:86761} peripheral edema. no clubbing or cyanosis; DP/PT/radial pulses 2+ bilaterally MS- no significant deformity or atrophy Skin- warm and dry, no rash or lesion Psych- euthymic mood, full affect Neuro- strength and  sensation are intact   Device interrogation done today and reviewed by myself:  Battery *** Lead thresholds, impedence, sensing stable *** *** episodes *** changes made today  EKG is not ordered. Personal review of EKG from  03/23/2022  shows:  NSR, rate 63bpm   Recent Labs: 05/27/2021: B Natriuretic Peptide 159.2 03/23/2022: ALT 22; BUN 25; Creatinine, Ser 1.17; Hemoglobin 13.6; Magnesium 2.2; Platelets 165; Potassium 4.5; Sodium 139; TSH 0.673  09/03/2021: Chol/HDL Ratio 2.6; Cholesterol, Total 156; HDL 61; LDL Chol Calc (NIH) 78; Triglycerides 92  Estimated Creatinine Clearance: 61.9 mL/min (by C-G formula based on SCr of 1.17 mg/dL).   Wt Readings from Last 3 Encounters:  03/23/22 215 lb (97.5 kg)  01/15/22 219 lb 12.8 oz (99.7 kg)  01/02/22 217 lb 12.8 oz (98.8 kg)     Additional studies reviewed include: Previous EP notes, ER notes.   TTE 11/08/2021 1. Left ventricular ejection fraction, by estimation, is 50 to 55%. The left ventricle has normal function. The left ventricle has no regional wall motion abnormalities. Left ventricular diastolic function could not be evaluated.   2. Right ventricular systolic function is normal. The right ventricular size is normal. There is normal pulmonary artery systolic pressure.   3. Left atrial size was severely dilated.   4. Right atrial size was severely dilated.   5. The mitral valve has been repaired/replaced. No evidence of mitral valve regurgitation. No evidence of mitral stenosis. Procedure Date: 2002.   6. The aortic valve is tricuspid. Aortic valve regurgitation is not visualized. Aortic valve sclerosis is present, with no evidence of aortic valve stenosis.   7. There is moderate dilatation of the aortic root, measuring 40 mm. There is moderate dilatation of the ascending aorta, measuring 44 mm.   8. The inferior vena cava is normal in size with greater than 50% respiratory variability, suggesting right atrial pressure of 3 mmHg.    ASSESSMENT AND PLAN:  #) parox Afib  CHA2DS2-VASc Score = 4 [CHF History: 1, HTN History: 0, Diabetes History: 0, Stroke History: 0, Vascular Disease History: 1, Age Score: 2, Gender Score: 0].  Therefore, the patient's annual risk of stroke is 4.8 %. AC - Eliquis '5mg'$ , dosed appropriately    #) ILR in situ    Current medicines are reviewed at length with the patient today.   The patient {ACTIONS; HAS/DOES NOT HAVE:19233} concerns regarding his medicines.  The following changes were made today:  {NONE DEFAULTED:18576}  Labs/ tests ordered today include: *** No orders of the defined types were placed in this encounter.    Disposition: Follow up with {EPMDS:28135} in {EPFOLLOW UP:28173}   Signed, Mamie Levers, NP  03/28/22 7:40 AM   [facility] [place] [address] [phone] (office)  [phone] (fax)

## 2022-04-05 ENCOUNTER — Encounter (HOSPITAL_BASED_OUTPATIENT_CLINIC_OR_DEPARTMENT_OTHER): Payer: Self-pay | Admitting: Emergency Medicine

## 2022-04-05 ENCOUNTER — Telehealth: Payer: Self-pay | Admitting: Cardiology

## 2022-04-05 ENCOUNTER — Emergency Department (HOSPITAL_BASED_OUTPATIENT_CLINIC_OR_DEPARTMENT_OTHER)
Admission: EM | Admit: 2022-04-05 | Discharge: 2022-04-05 | Disposition: A | Discharge status: Home / Self Care | Payer: Medicare Other | Attending: Emergency Medicine | Admitting: Emergency Medicine

## 2022-04-05 ENCOUNTER — Other Ambulatory Visit: Payer: Self-pay

## 2022-04-05 DIAGNOSIS — I480 Paroxysmal atrial fibrillation: Secondary | ICD-10-CM | POA: Diagnosis not present

## 2022-04-05 DIAGNOSIS — R002 Palpitations: Secondary | ICD-10-CM | POA: Diagnosis present

## 2022-04-05 DIAGNOSIS — I4891 Unspecified atrial fibrillation: Secondary | ICD-10-CM | POA: Diagnosis not present

## 2022-04-05 DIAGNOSIS — I4892 Unspecified atrial flutter: Secondary | ICD-10-CM

## 2022-04-05 DIAGNOSIS — Z7901 Long term (current) use of anticoagulants: Secondary | ICD-10-CM | POA: Diagnosis not present

## 2022-04-05 LAB — BASIC METABOLIC PANEL
Anion gap: 7 (ref 5–15)
BUN: 33 mg/dL — ABNORMAL HIGH (ref 8–23)
CO2: 26 mmol/L (ref 22–32)
Calcium: 9.1 mg/dL (ref 8.9–10.3)
Chloride: 106 mmol/L (ref 98–111)
Creatinine, Ser: 1.31 mg/dL — ABNORMAL HIGH (ref 0.61–1.24)
GFR, Estimated: 55 mL/min — ABNORMAL LOW (ref >60)
Glucose, Bld: 115 mg/dL — ABNORMAL HIGH (ref 70–99)
Potassium: 3.9 mmol/L (ref 3.5–5.1)
Sodium: 139 mmol/L (ref 135–145)

## 2022-04-05 LAB — CBC WITH DIFFERENTIAL/PLATELET
Abs Immature Granulocytes: 0.03 10*3/uL (ref 0.00–0.07)
Basophils Absolute: 0 10*3/uL (ref 0.0–0.1)
Basophils Relative: 1 %
Eosinophils Absolute: 0.1 10*3/uL (ref 0.0–0.5)
Eosinophils Relative: 2 %
HCT: 41.6 % (ref 39.0–52.0)
Hemoglobin: 13.9 g/dL (ref 13.0–17.0)
Immature Granulocytes: 0 %
Lymphocytes Relative: 15 %
Lymphs Abs: 1.1 10*3/uL (ref 0.7–4.0)
MCH: 30.8 pg (ref 26.0–34.0)
MCHC: 33.4 g/dL (ref 30.0–36.0)
MCV: 92.2 fL (ref 80.0–100.0)
Monocytes Absolute: 0.6 10*3/uL (ref 0.1–1.0)
Monocytes Relative: 9 %
Neutro Abs: 5.3 10*3/uL (ref 1.7–7.7)
Neutrophils Relative %: 73 %
Platelets: 142 10*3/uL — ABNORMAL LOW (ref 150–400)
RBC: 4.51 MIL/uL (ref 4.22–5.81)
RDW: 13 % (ref 11.5–15.5)
WBC: 7.2 10*3/uL (ref 4.0–10.5)
nRBC: 0 % (ref 0.0–0.2)

## 2022-04-05 LAB — MAGNESIUM: Magnesium: 2.2 mg/dL (ref 1.7–2.4)

## 2022-04-05 LAB — TROPONIN I (HIGH SENSITIVITY): Troponin I (High Sensitivity): 6 ng/L (ref <18)

## 2022-04-05 MED ORDER — PROPOFOL 10 MG/ML IV BOLUS
INTRAVENOUS | Status: AC | PRN
  Administered 2022-04-05: 40 mg via INTRAVENOUS

## 2022-04-05 MED ORDER — PROPOFOL 10 MG/ML IV BOLUS
0.5000 mg/kg | Freq: Once | INTRAVENOUS | Status: DC
  Filled 2022-04-05: qty 20

## 2022-04-05 MED ORDER — SODIUM CHLORIDE 0.9 % IV BOLUS
500.0000 mL | Freq: Once | INTRAVENOUS | Status: AC
  Administered 2022-04-05: 500 mL via INTRAVENOUS

## 2022-04-05 NOTE — ED Notes (Signed)
Patient verbalizes understanding of discharge instructions. Opportunity for questioning and answers were provided. Patient discharged from ED.

## 2022-04-05 NOTE — Sedation Documentation (Addendum)
150j shock given

## 2022-04-05 NOTE — Telephone Encounter (Signed)
Remote transmission reviewed patient in AF with RVR at time of transmission at 1051.

## 2022-04-05 NOTE — Discharge Instructions (Signed)
You are seen in the emergency department for rapid heart rate fatigue.  You were in atrial flutter and received cardioversion with improvement in your rhythm back to sinus.  Please continue your regular medications and keep well-hydrated.  Follow-up with your cardiology team.  Return to the emergency department if any worsening or concerning symptoms.

## 2022-04-05 NOTE — ED Notes (Signed)
Pt able to ambulate in room w/ ease

## 2022-04-05 NOTE — ED Triage Notes (Addendum)
Pt presents to ED POV. Pt c/o rapid, irregular HR since ~0900. Pt reports hx of a. fib. Pt took extra 12.5 metoprolol on top of regular 12.5. pt reports heart monitor at home read a rate of 133.

## 2022-04-05 NOTE — Telephone Encounter (Signed)
Called patient back about message. Patient has history of A. FIB and is feeling lightheaded. Patient has loop recorder. His BP is 105/66 and HR 122. Encourage patient to go to ER for evaluation. Will also send to device clinic for any other advisement.

## 2022-04-05 NOTE — ED Provider Notes (Signed)
[facility] Provider Note   CSN: [id number] Arrival date & time: 04/05/22  1114     History  Chief Complaint  Patient presents with   Tachycardia    Sean Stanley is a 82 y.o. male.  He has a history of paroxysmal A-fib a flutter, is on Eliquis and has had an ablation in the past.  He is complaining of a rapid heart beat that started around 9 AM this morning.  He said his heart rates were up in the 130s and causes him to feel a little lightheaded and anxious.  He has a loop recorder and sent a strip to his cardiologist who recommended he come to the emergency department.  He did have a large breakfast at 9 AM.  No recent illness.  He was just here last week for feeling his elevated heart rate but was in sinus at that time.  No recent illness.  He took an extra 12.5 of metoprolol this morning  The history is provided by the patient and the spouse.  Palpitations Palpitations quality:  Fast Onset quality:  Sudden Duration:  2 hours Timing:  Constant Progression:  Unchanged Chronicity:  Recurrent Relieved by:  Nothing Worsened by:  Nothing Ineffective treatments:  Beta blockers Associated symptoms: dizziness and malaise/fatigue   Associated symptoms: no chest pain, no diaphoresis, no nausea, no syncope and no vomiting   Risk factors: hx of atrial fibrillation        Home Medications Prior to Admission medications   Medication Sig Start Date End Date Taking? Authorizing Provider  acetaminophen (TYLENOL) 500 MG tablet Take 1,000 mg by mouth daily as needed for moderate pain.    [provider]  alfuzosin (UROXATRAL) 10 MG 24 hr tablet Take 10 mg by mouth at bedtime.     [provider]  apixaban (ELIQUIS) 5 MG TABS tablet Take 1 tablet (5 mg total) by mouth 2 (two) times daily. 05/28/21   Fenton, Clint R, PA  buPROPion (WELLBUTRIN XL) 150 MG 24 hr tablet Take 1 tablet (150 mg total) by mouth daily. 05/22/21   Dohmeier, Asencion Partridge, MD   Cholecalciferol (VITAMIN D) 2000 units tablet Take 2,000 Units by mouth at bedtime.     [provider]  ezetimibe (ZETIA) 10 MG tablet Take 1 tablet (10 mg total) by mouth every morning. 03/07/21   Freada Bergeron, MD  finasteride (PROSCAR) 5 MG tablet Take 5 mg by mouth daily with supper. 09/23/14   [provider]  influenza vaccine adjuvanted (FLUAD) 0.5 ML injection Inject into the muscle. 01/07/22     levothyroxine (SYNTHROID) 175 MCG tablet Take 1 tablet (175 mcg total) by mouth daily. 05/25/19   Heath Lark, MD  lidocaine-prilocaine (EMLA) cream Apply to affected area once before sleep time- 05/22/21   Dohmeier, Asencion Partridge, MD  metoprolol succinate (TOPROL-XL) 25 MG 24 hr tablet Take 12.5 mg by mouth daily. 06/11/16   [provider]  Polyethyl Glycol-Propyl Glycol (SYSTANE HYDRATION PF OP) Place 1 drop into both eyes 3 (three) times daily.    [provider]  rosuvastatin (CRESTOR) 5 MG tablet Take 1 tablet (5 mg total) by mouth every Monday, Wednesday, and Friday. 09/07/21   Freada Bergeron, MD      Allergies    Levaquin [levofloxacin in d5w], Nickel, Other, Pravastatin, Simvastatin, Statins, Allopurinol, Ampicillin, and Penicillin g    Review of Systems   Review of Systems  Constitutional:  Positive for fatigue and malaise/fatigue. Negative for  diaphoresis.  Cardiovascular:  Positive for palpitations. Negative for chest pain and syncope.  Gastrointestinal:  Negative for nausea and vomiting.  Neurological:  Positive for dizziness.    Physical Exam Updated Vital Signs BP 102/61 (BP Location: Right Arm)   Pulse (!) 108   Temp 97.7 F (36.5 C)   Resp 18   SpO2 100%  Physical Exam Vitals and nursing note reviewed.  Constitutional:      General: He is not in acute distress.    Appearance: Normal appearance. He is well-developed.  HENT:     Head: Normocephalic and atraumatic.     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  Cardiovascular:     Rate and Rhythm: Regular rhythm. Tachycardia present.     Heart sounds: No murmur heard. Pulmonary:     Effort: Pulmonary effort is normal. No respiratory distress.     Breath sounds: Normal breath sounds.  Abdominal:     Palpations: Abdomen is soft.     Tenderness: There is no abdominal tenderness.  Musculoskeletal:        General: No deformity.     Cervical back: Neck supple.  Skin:    General: Skin is warm and dry.     Capillary Refill: Capillary refill takes less than 2 seconds.  Neurological:     General: No focal deficit present.     Mental Status: He is alert.     ED Results / Procedures / Treatments   Labs (all labs ordered are listed, but only abnormal results are displayed) Labs Reviewed  BASIC METABOLIC PANEL - Abnormal; Notable for the following components:      Result Value   Glucose, Bld 115 (*)    BUN 33 (*)    Creatinine, Ser 1.31 (*)    GFR, Estimated 55 (*)    All other components within normal limits  CBC WITH DIFFERENTIAL/PLATELET - Abnormal; Notable for the following components:   Platelets 142 (*)    All other components within normal limits  MAGNESIUM  TROPONIN I (HIGH SENSITIVITY)    EKG EKG Interpretation  Date/Time:  Friday April 05 2022 11:21:04 EST Ventricular Rate:  115 PR Interval:    QRS Duration: 100 QT Interval:  342 QTC Calculation: 473 R Axis:   2 Text Interpretation: Atrial flutter with variable A-V block ST & T wave abnormality, consider lateral ischemia Abnormal ECG When compared with ECG of 23-Mar-2022 08:28, Atrial flutter has replaced Sinus rhythm Vent. rate has increased BY  50 BPM ST now depressed in Inferior leads ST more depressed Anterior leads T wave inversion now evident in Inferior leads T wave inversion now evident in Lateral leads Confirmed by Aletta Edouard [phone]) on 04/05/2022 11:24:43  AM  Radiology No results found.  Procedures .Sedation  Date/Time: 04/05/2022 1:51 PM  Performed by: Hayden Rasmussen, MD Authorized by: Hayden Rasmussen, MD   Consent:    Consent obtained:  Written   Consent given by:  Patient   Risks discussed:  Allergic reaction, dysrhythmia, inadequate sedation, nausea, vomiting, respiratory compromise necessitating ventilatory assistance and intubation and prolonged sedation necessitating reversal   Alternatives discussed:  Analgesia without sedation Universal protocol:    Procedure explained and questions answered to patient or proxy's satisfaction: yes     Immediately prior to procedure, a time out was called: yes     Patient  identity confirmed:  Arm band Indications:    Procedure performed:  Cardioversion Pre-sedation assessment:    Time since last food or drink:  4   ASA classification: class 3 - patient with severe systemic disease     Mouth opening:  3 or more finger widths   Thyromental distance:  3 finger widths   Mallampati score:  II - soft palate, uvula, fauces visible   Neck mobility: normal     Pre-sedation assessments completed and reviewed: airway patency, cardiovascular function, hydration status, mental status, nausea/vomiting, pain level, respiratory function and temperature     Pre-sedation assessment completed:  04/05/2022 1:21 PM Immediate pre-procedure details:    Reassessment: Patient reassessed immediately prior to procedure     Reviewed: vital signs, relevant labs/tests and NPO status     Verified: bag valve mask available, emergency equipment available, intubation equipment available, IV patency confirmed, oxygen available and suction available   Procedure details (see MAR for exact dosages):    Preoxygenation:  Nasal cannula   Sedation:  Propofol   Intra-procedure monitoring:  Blood pressure monitoring, cardiac monitor, continuous pulse oximetry, continuous capnometry, frequent LOC assessments and frequent vital  sign checks   Intra-procedure events: none     Total Provider sedation time (minutes):  10 Post-procedure details:    Post-sedation assessment completed:  04/05/2022 1:52 PM   Attendance: Constant attendance by certified staff until patient recovered     Recovery: Patient returned to pre-procedure baseline     Post-sedation assessments completed and reviewed: airway patency, cardiovascular function, hydration status, mental status, nausea/vomiting, pain level, respiratory function and temperature     Patient is stable for discharge or admission: yes     Procedure completion:  Tolerated well, no immediate complications .Cardioversion  Date/Time: 04/05/2022 1:52 PM  Performed by: Hayden Rasmussen, MD Authorized by: Hayden Rasmussen, MD   Consent:    Consent obtained:  Written   Consent given by:  Patient   Risks discussed:  Cutaneous burn, death, induced arrhythmia and pain   Alternatives discussed:  No treatment, delayed treatment and referral Pre-procedure details:    Cardioversion basis:  Elective   Rhythm:  Atrial flutter   Electrode placement:  Anterior-posterior Patient sedated: Yes. Refer to sedation procedure documentation for details of sedation.  Attempt one:    Cardioversion mode:  Synchronous   Shock (Joules):  150   Shock outcome:  Conversion to normal sinus rhythm Post-procedure details:    Patient status:  Awake   Patient tolerance of procedure:  Tolerated well, no immediate complications     Medications Ordered in ED Medications  propofol (DIPRIVAN) 10 mg/mL bolus/IV push 48.8 mg (48.8 mg Intravenous Not Given 04/05/22 1410)  sodium chloride 0.9 % bolus 500 mL (0 mLs Intravenous Stopped 04/05/22 1304)  propofol (DIPRIVAN) 10 mg/mL bolus/IV push (40 mg Intravenous Given 04/05/22 1341)    ED Course/ Medical Decision Making/ A&P Clinical Course as of 04/05/22 1349  Fri Apr 05, 2022  1311 Reviewed EKGs and plan with Dr. Johnsie Cancel cardiology.  He agrees that the  patient's and flutter and would be a good candidate for cardioversion. [MB]  8416 I reviewed recommendations from cardiology with patient and wife.  We had a long discussion regarding elective cardioversion and they would like to proceed with this. [MB]    Clinical Course User Index [MB] Hayden Rasmussen, MD  Medical Decision Making Amount and/or Complexity of Data Reviewed Labs: ordered.   This patient complains of rapid heart rate lightheadedness fatigue; this involves an extensive number of treatment Options and is a complaint that carries with it a high risk of complications and morbidity. The differential includes dysrhythmia, ACS, dehydration, infection, metabolic derangement  I ordered, reviewed and interpreted labs, which included CBC with normal white count normal hemoglobin, chemistries with mild elevation in BUN and creatinine, troponin unremarkable I ordered medication IV fluids medication for procedural sedation and reviewed PMP when indicated. Additional history obtained from patient's wife Previous records obtained and reviewed in epic including recent cardiology notes I consulted cardiology Dr. Johnsie Cancel and discussed lab and imaging findings and discussed disposition.  Cardiac monitoring reviewed, tachycardia likely a flutter Social determinants considered, no significant barriers Critical Interventions: None  After the interventions stated above, I reevaluated the patient and found patient to be symptomatically improved after cardioversion Admission and further testing considered, no indications for admission at this time.  Patient will follow-up outpatient with his cardiologist.  I secure message Dr. Curt Bears who will arrange outpatient follow-up.  Return instructions discussed         Final Clinical Impression(s) / ED Diagnoses Final diagnoses:  Atrial flutter with rapid ventricular response ([facility])  Paroxysmal atrial fibrillation ([place])     Rx / DC Orders ED Discharge Orders     None         Hayden Rasmussen, MD 04/05/22 1643

## 2022-04-05 NOTE — Sedation Documentation (Signed)
Unable to rate pain during the procedure due to sedation 

## 2022-04-05 NOTE — Telephone Encounter (Signed)
Patient c/o Palpitations:  High priority if patient c/o lightheadedness, shortness of breath, or chest pain  How long have you had palpitations/irregular HR/ Afib? Are you having the symptoms now? Some afib this morning   Are you currently experiencing lightheadedness, SOB or CP? No   Do you have a history of afib (atrial fibrillation) or irregular heart rhythm? N/a   Have you checked your BP or HR? (document readings if available):  87/69  hr 133   Are you experiencing any other symptoms? No   Pt took his metoprolol  this morning and added another half after feeling the afib. Pt has an appt with Charlcie Cradle 04/08/22

## 2022-04-07 NOTE — Progress Notes (Signed)
Cardiology Office Note Date:  04/08/2022  Patient ID:  Sean Stanley March 01, 1941, MRN [id number] PCP:  Emilio Aspen, MD  Cardiologist:  Dr. Shari Prows Electrophysiologist: Dr. Elberta Fortis    Chief Complaint:  post ER visit  History of Present Illness: Sean Stanley is a 82 y.o. 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 He is currently in remission for both of his cancers. He has residual vocal cord dysfunction.  --TTE at [facility] 10/2019 that showed mild left ventricle dysfunction with LVEF of 45 to 50%  -- TTE 10/2021 with LVEF 50-55%, normal RV, severe biatrial enlargement, normal functioning MVR with no MS/MR, mild aortic root and ascending aortic dilation at 40mm and 44mm respectively.   He saw Dr. Shari Prows 12/07/21, discussed a number of personal stressors, anxiety, , sleeping poorly, increasing pain with his LE neuropathy.  No cardiac complaints. Recommended support stockings for edema. He was back in [facility] for his AFib/flutter, discussed +/- watchman in the future, the pt not quite on board with that idea.  He saw Dr Elberta Fortis 01/02/22, discussed development of AFib/atypical AFlutter with a COVID infection with subsequent arrhythmias as well, having a few DCCV, but none since the spring, the pt happy with his rhythm control.  No changes were made  ER visit 03/23/22 with palpitations, monitored at home, took his BB dose early, eventually going to the ER with low BPs at home as well, in route started to improve. He was in SR on arrival, suspect to be dehydrated, given IVF. Labs reassuring. Discharged  from the ER  ER 04/05/22, with palpitations, took an extra dose of his BB at home, loop transmission as sent and he was advised to go to the ER, he was in AFlutter 115on his EKG. He was cardioverted in the ER discharged to f/u with EP LABS K+ 3.9 BUN/Creat 33/1.31 (about baseline) Mag 2.2 WBC 7.2 H/H 13/41 Plts 142  TSH 0.673   TODAY He bring a typed out synopsis of his last couple ER visits/weeks. Home BP/HR numbers as well Finding his BP and HR unusually low a cfew times at hom  Typically 120's SBP and HR 60's has seen a couple 100's-11's and HR 50's  He does not note any particular trigger for his up-tick in Afib of late Acknowledges some anxiety at his baseline with increased worries about his wife's health of late Both Afib episodes woke him f rom sleep. No longer uses CPAP after a sleep study found he no longer required it. No reports of snoring or obvious apnic events reported.  AFib makes him feel very fatigues, tired, he is very aware of the irregularity in his heart rhythm.  Feels poorly Both events he took his usual metoprolol dose, and eventually another 1/2 tab.  The second event he did find his BP creeping down and feeling worse when he went in.  No CP with the Afib or otherwise No near syncope or syncope.  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, IR could not access culprit lesion and treated with anticoag reversal; no AAA noted on 06/13/17 CTA   Aneurysm artery, celiac (HCC)    followed at [facility]   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 [facility])    being followed at [facility]   Aneurysm of splenic artery (HCC) 2014   s/p coiling 12/16/12 - [facility]   Atrial flutter (HCC)    BPH (benign prostatic hyperplasia)    Cancer (HCC)    melanoma on lower right back and left chest - surgically removed and cleared   Difficult intubation    Dysrhythmia    H/O agent Orange exposure    Headache    migraine- not current   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 Earl Gala told him he had improved   Pneumonia    Thoracic ascending aortic aneurysm (HCC)    4.5 cm 03/2018 CT. 4.4 cm on echo 10/2021 and on CTA 03/2021   Thyroid cancer [facility])     Past Surgical History:  Procedure Laterality Date   ABDOMINAL ANGIOGRAM  08/05/2012   aneurysm repair     CARDIOVERSION  02/12/2012   Procedure: CARDIOVERSION;  Surgeon: [name], MD;  Location: [facility] ENDOSCOPY;  Service: Cardiovascular;  Laterality: N/A;   CARDIOVERSION N/A 06/22/2013   Procedure: CARDIOVERSION;  Surgeon: Lars Masson, MD;  Location: [facility] ENDOSCOPY;  Service: Cardiovascular;  Laterality: N/A;   CARDIOVERSION N/A 06/19/2021   Procedure: CARDIOVERSION;  Surgeon: Christell Constant, MD;  Location: [facility] ENDOSCOPY;  Service: Cardiovascular;  Laterality: N/A;   CATARACT EXTRACTION Bilateral 2018   with lens implant   CHOLECYSTECTOMY     COLONOSCOPY     ESOPHAGOGASTRODUODENOSCOPY     implantable loop recorder placement  03/29/2021   Medtronic Reveal Linq model LNQ [id number] G) implantable loop recorder   IR IMAGING GUIDED PORT INSERTION  05/26/2018   IR REMOVAL TUN ACCESS W/ PORT W/O FL MOD SED  02/05/2021   LYMPH NODE BIOPSY Left 04/27/2018    Procedure: EXCISIONAL BIOPSY OF LEFT CERVICAL LYMPH NODE;  Surgeon: Christia Reading, MD;  Location: [facility] OR;  Service: ENT;  Laterality: Left;   LYMPH NODE BIOPSY Left 11/23/2018   Procedure: LEFT CERVICAL LYMPH NODE OPEN BIOPSY;  Surgeon: Christia Reading, MD;  Location[facility] OR;  Service: ENT;  Laterality: Left;   MENISCUS REPAIR Right 2009   MITRAL VALVE REPAIR  2002   [facility]   NM MYOVIEW LTD  07/22/2006   no ischemia   RADICAL NECK DISSECTION Left 01/15/2019   Procedure: LEFT NECK DISSECTION;  Surgeon: Christia Reading, MD;  Location: [facility] OR;  Service: ENT;  Laterality: Left;   RIGHT HEART CATH  06/19/2004   normal right heart dynamics. EF 50%   TEE WITHOUT CARDIOVERSION  02/12/2012   Procedure: TRANSESOPHAGEAL ECHOCARDIOGRAM (TEE);  Surgeon: [name], MD;  Location:  [facility] ENDOSCOPY;  Service: Cardiovascular;  Laterality: N/A;   TEE WITHOUT CARDIOVERSION N/A 06/22/2013   Procedure: TRANSESOPHAGEAL ECHOCARDIOGRAM (TEE);  Surgeon: Lars Masson, MD;  Location: [facility] ENDOSCOPY;  Service: Cardiovascular;  Laterality: N/A;   THYROIDECTOMY N/A 01/15/2019   Procedure: TOTAL THYROIDECTOMY;  Surgeon: Christia Reading, MD;  Location[facility] OR;  Service: ENT;  Laterality: N/A;    Current Outpatient Medications  Medication Sig Dispense Refill   acetaminophen (TYLENOL) 500 MG tablet Take 1,000 mg by mouth daily as needed for moderate pain.     alfuzosin (UROXATRAL) 10 MG 24 hr tablet Take 10 mg by mouth at bedtime.      apixaban (ELIQUIS) 5 MG TABS tablet Take 1 tablet (5 mg total) by mouth 2 (two) times daily. 60 tablet 3   buPROPion (WELLBUTRIN XL) 150 MG 24 hr tablet Take 1 tablet (150 mg total) by mouth daily. 90 tablet 3   Cholecalciferol (VITAMIN D) 2000 units tablet Take 2,000 Units by mouth at bedtime.      ezetimibe (ZETIA) 10 MG tablet Take 1 tablet (10 mg total) by mouth every morning. 90 tablet 2   finasteride (PROSCAR) 5 MG tablet Take 5 mg by mouth daily with supper.     influenza vaccine  adjuvanted (FLUAD) 0.5 ML injection Inject into the muscle. 0.5 mL 0   levothyroxine (SYNTHROID) 175 MCG tablet Take 1 tablet (175 mcg total) by mouth daily.     lidocaine-prilocaine (EMLA) cream Apply to affected area once before sleep time- 30 g 3   metoprolol succinate (TOPROL-XL) 25 MG 24 hr tablet Take 12.5 mg by mouth daily.     Polyethyl Glycol-Propyl Glycol (SYSTANE HYDRATION PF OP) Place 1 drop into both eyes 3 (three) times daily.     rosuvastatin (CRESTOR) 5 MG tablet Take 1 tablet (5 mg total) by mouth every Monday, Wednesday, and Friday. 36 tablet 3   No current facility-administered medications for this visit.    Allergies:   Levaquin [levofloxacin in d5w], Nickel, Pravastatin, Simvastatin, Statins, Allopurinol, Ampicillin, and Penicillin g   Social History:  The patient  reports that he has never smoked. He has never used smokeless tobacco. He reports that he does not currently use alcohol. He reports that he does not use drugs.   Family History:  The patient's family history includes Cancer in his maternal grandmother; Heart attack in his maternal grandfather, paternal grandfather, and paternal grandmother; Heart failure (age of onset: 47) in his father; Parkinson's disease (age of onset: 16) in his mother.  ROS:  Please see the history of present illness.    All other systems are reviewed and otherwise negative.   PHYSICAL EXAM:  VS:  BP 122/78   Pulse 64   Ht 6\' 1"  (1.854 m)   Wt 217 lb (98.4 kg)   SpO2 97%   BMI 28.63 kg/m  BMI: Body mass index is 28.63 kg/m. Well nourished, well developed, in no acute distress HEENT: normocephalic, atraumatic Neck: no JVD, carotid bruits or masses Cardiac:  RRR; no significant murmurs, no rubs, or gallops Lungs:  CTA b/l, no wheezing, rhonchi or rales Abd: soft, nontender MS: no deformity or atrophy Ext: no edema Skin: warm and dry, no rash Neuro:  No gross deficits appreciated Psych: euthymic mood, full affect  ILR site is  stable, no tethering or discomfort   EKG:  Done today and reviewed by myself shows  SR 64bpm, normal EKG  ER EKSG AFlutter (looks typical) 115bpm AFlutter 105 SR  60bpm  Device interrogation done today and reviewed by myself:  Battery is good Presenting SR, R waves 0.101mV AF burden <0.1% Noted recent AFib burden.  Longest 2hours , rates 70's-140's Tracings appear more like Afib then fltter   11/08/21: TTE 1. Left ventricular ejection fraction, by estimation, is 50 to 55%. The  left ventricle has normal function. The left ventricle has no regional  wall motion abnormalities. Left ventricular diastolic function could not  be evaluated.   2. Right ventricular systolic function is normal. The right ventricular  size is normal. There is normal pulmonary artery systolic pressure.   3. Left atrial size was severely dilated.   4. Right atrial size was severely dilated.   5. The mitral valve has been repaired/replaced. No evidence of mitral  valve regurgitation. No evidence of mitral stenosis. Procedure Date: 2002.   6. The aortic valve is tricuspid. Aortic valve regurgitation is not  visualized. Aortic valve sclerosis is present, with no evidence of aortic  valve stenosis.   7. There is moderate dilatation of the aortic root, measuring 40 mm.  There is moderate dilatation of the ascending aorta, measuring 44 mm.   8. The inferior vena cava is normal in size with greater than 50%  respiratory variability, suggesting right atrial pressure of 3 mmHg.    Recent Labs: 05/27/2021: B Natriuretic Peptide 159.2 03/23/2022: ALT 22; TSH 0.673 04/05/2022: BUN 33; Creatinine, Ser 1.31; Hemoglobin 13.9; Magnesium 2.2; Platelets 142; Potassium 3.9; Sodium 139  09/03/2021: Chol/HDL Ratio 2.6; Cholesterol, Total 156; HDL 61; LDL Chol Calc (NIH) 78; Triglycerides 92   Estimated Creatinine Clearance: 55.5 mL/min (A) (by C-G formula based on SCr of 1.31 mg/dL (H)).   Wt Readings from Last 3  Encounters:  04/08/22 217 lb (98.4 kg)  03/23/22 215 lb (97.5 kg)  01/15/22 219 lb 12.8 oz (99.7 kg)     Other studies reviewed: Additional studies/records reviewed today include: summarized above  ASSESSMENT AND PLAN:  Paroxysmal Afib Atypical Aflutter CHA2DS2Vasc is 3, on Eliquis, appropriately dosed Secondary hypercoagulable state <0.1 % burden quite symptomatic Unclear trigger  for recent Afib episodes, perhaps worry/anxiety BP perhaps HR may limit advancing nodal blocker He would be a tikosyn/amiodarone AAD choice Neither seemed to be great options in his mind at least today in our brief discussion  Watchful waiting is reasonable at this juncture, and he is comfortable with that I have turned on brady and pause today  Briefly discussed ablation, I think he has true Afib as well, has AFlutter as well (by record atypical), by his ER EKG looked more typical Would not I think jump to procedural management yet, he is fine with that   VHD Prior MV repair Functioning well on his last echo C/w Dr. Shari Prows  Vascular aneurysms Follows with vascular team  NICM Recovered LVEF C/w Dr. Shari Prows   Disposition: F/u with Dr. Elberta Fortis  Current medicines are reviewed at length with the patient today.  The patient did not have any concerns regarding medicines.  Norma Fredrickson, PA-C 04/08/2022 9:03 AM     [facility] [address] [place] [address] [phone] (office)  [phone] (fax)

## 2022-04-08 ENCOUNTER — Encounter: Payer: Self-pay | Admitting: Physician Assistant

## 2022-04-08 ENCOUNTER — Ambulatory Visit: Payer: Medicare Other | Attending: Physician Assistant | Admitting: Physician Assistant

## 2022-04-08 VITALS — BP 122/78 | HR 64 | Ht 73.0 in | Wt 217.0 lb

## 2022-04-08 DIAGNOSIS — Z4509 Encounter for adjustment and management of other cardiac device: Secondary | ICD-10-CM | POA: Diagnosis not present

## 2022-04-08 DIAGNOSIS — I480 Paroxysmal atrial fibrillation: Secondary | ICD-10-CM

## 2022-04-08 DIAGNOSIS — I483 Typical atrial flutter: Secondary | ICD-10-CM | POA: Diagnosis not present

## 2022-04-08 DIAGNOSIS — I428 Other cardiomyopathies: Secondary | ICD-10-CM | POA: Insufficient documentation

## 2022-04-08 DIAGNOSIS — Z9889 Other specified postprocedural states: Secondary | ICD-10-CM

## 2022-04-08 NOTE — Patient Instructions (Signed)
Medication Instructions:   Your physician recommends that you continue on your current medications as directed. Please refer to the Current Medication list given to you today.  *If you need a refill on your cardiac medications before your next appointment, please call your pharmacy*   Lab [place]   If you have labs (blood work) drawn today and your tests are completely normal, you will receive your results only by: [place] (if you have MyChart) OR A paper copy in the mail If you have any lab test that is abnormal or we need to change your treatment, we will call you to review the results.   Testing/Procedures: NONE ORDERED  TODAY   Follow-Up: At [facility], you and your health needs are our priority.  As part of our continuing mission to provide you with exceptional heart care, we have created designated Provider Care Teams.  These Care Teams include your primary Cardiologist (physician) and Advanced Practice Providers (APPs -  Physician Assistants and Nurse Practitioners) who all work together to provide you with the care you need, when you need it.  We recommend signing up for the patient portal called "MyChart".  Sign up information is provided on this After Visit Summary.  MyChart is used to connect with patients for Virtual Visits (Telemedicine).  Patients are able to view lab/test results, encounter notes, upcoming appointments, etc.  Non-urgent messages can be sent to your provider as well.   To learn more about what you can do with MyChart, go to [email or web address].    Your next appointment:   2 month(s)  Provider:   Allegra Lai, MD    Other Instructions

## 2022-04-09 ENCOUNTER — Other Ambulatory Visit: Payer: Self-pay

## 2022-04-09 ENCOUNTER — Telehealth: Payer: Self-pay | Admitting: Internal Medicine

## 2022-04-09 DIAGNOSIS — I4819 Other persistent atrial fibrillation: Secondary | ICD-10-CM | POA: Diagnosis not present

## 2022-04-09 DIAGNOSIS — Z79899 Other long term (current) drug therapy: Secondary | ICD-10-CM | POA: Insufficient documentation

## 2022-04-09 DIAGNOSIS — R42 Dizziness and giddiness: Secondary | ICD-10-CM | POA: Insufficient documentation

## 2022-04-09 DIAGNOSIS — Z8249 Family history of ischemic heart disease and other diseases of the circulatory system: Secondary | ICD-10-CM | POA: Diagnosis not present

## 2022-04-09 DIAGNOSIS — N400 Enlarged prostate without lower urinary tract symptoms: Secondary | ICD-10-CM | POA: Diagnosis not present

## 2022-04-09 DIAGNOSIS — D6869 Other thrombophilia: Secondary | ICD-10-CM | POA: Insufficient documentation

## 2022-04-09 DIAGNOSIS — Z7901 Long term (current) use of anticoagulants: Secondary | ICD-10-CM | POA: Insufficient documentation

## 2022-04-09 DIAGNOSIS — I4892 Unspecified atrial flutter: Secondary | ICD-10-CM | POA: Diagnosis not present

## 2022-04-09 DIAGNOSIS — I428 Other cardiomyopathies: Secondary | ICD-10-CM | POA: Diagnosis not present

## 2022-04-09 DIAGNOSIS — R002 Palpitations: Secondary | ICD-10-CM | POA: Diagnosis present

## 2022-04-09 NOTE — Telephone Encounter (Signed)
Patient's wife, Haytham Maher contacted the after hours line for the atrial fibrillation clinic.  Wife stated that patient had a heart rate between 106-109 bpm and was slightly lightheaded.  He recently had an atrial fibrillation ablation.  No reports of chest pain or fainting.  He takes metoprolol 12.'5mg'$  daily.  Patient will take another dose tonight.  If symptoms ongoing or worsen, he is to report to the ER.

## 2022-04-10 ENCOUNTER — Encounter (HOSPITAL_COMMUNITY): Payer: Self-pay | Admitting: Physician Assistant

## 2022-04-10 ENCOUNTER — Ambulatory Visit (HOSPITAL_BASED_OUTPATIENT_CLINIC_OR_DEPARTMENT_OTHER)
Admission: RE | Admit: 2022-04-10 | Discharge: 2022-04-10 | Disposition: A | Discharge status: Home / Self Care | Payer: Medicare Other | Source: Ambulatory Visit | Attending: Nurse Practitioner | Admitting: Nurse Practitioner

## 2022-04-10 ENCOUNTER — Emergency Department (HOSPITAL_BASED_OUTPATIENT_CLINIC_OR_DEPARTMENT_OTHER)
Admission: EM | Admit: 2022-04-10 | Discharge: 2022-04-10 | Disposition: A | Discharge status: Home / Self Care | Payer: Medicare Other | Attending: Emergency Medicine | Admitting: Emergency Medicine

## 2022-04-10 ENCOUNTER — Encounter (HOSPITAL_BASED_OUTPATIENT_CLINIC_OR_DEPARTMENT_OTHER): Payer: Self-pay

## 2022-04-10 ENCOUNTER — Emergency Department (HOSPITAL_BASED_OUTPATIENT_CLINIC_OR_DEPARTMENT_OTHER): Payer: Medicare Other

## 2022-04-10 VITALS — BP 118/70 | HR 114 | Ht 73.0 in | Wt 222.8 lb

## 2022-04-10 DIAGNOSIS — I4819 Other persistent atrial fibrillation: Secondary | ICD-10-CM | POA: Diagnosis not present

## 2022-04-10 DIAGNOSIS — D6869 Other thrombophilia: Secondary | ICD-10-CM | POA: Diagnosis not present

## 2022-04-10 DIAGNOSIS — I4892 Unspecified atrial flutter: Secondary | ICD-10-CM | POA: Diagnosis not present

## 2022-04-10 DIAGNOSIS — R002 Palpitations: Secondary | ICD-10-CM | POA: Diagnosis not present

## 2022-04-10 DIAGNOSIS — I484 Atypical atrial flutter: Secondary | ICD-10-CM | POA: Diagnosis not present

## 2022-04-10 LAB — BASIC METABOLIC PANEL
Anion gap: 10 (ref 5–15)
BUN: 29 mg/dL — ABNORMAL HIGH (ref 8–23)
CO2: 24 mmol/L (ref 22–32)
Calcium: 9.3 mg/dL (ref 8.9–10.3)
Chloride: 105 mmol/L (ref 98–111)
Creatinine, Ser: 1.21 mg/dL (ref 0.61–1.24)
GFR, Estimated: >60 mL/min (ref >60)
Glucose, Bld: 104 mg/dL — ABNORMAL HIGH (ref 70–99)
Potassium: 4.4 mmol/L (ref 3.5–5.1)
Sodium: 139 mmol/L (ref 135–145)

## 2022-04-10 LAB — CBC
HCT: 42.3 % (ref 39.0–52.0)
Hemoglobin: 14.2 g/dL (ref 13.0–17.0)
MCH: 30.7 pg (ref 26.0–34.0)
MCHC: 33.6 g/dL (ref 30.0–36.0)
MCV: 91.4 fL (ref 80.0–100.0)
Platelets: 143 10*3/uL — ABNORMAL LOW (ref 150–400)
RBC: 4.63 MIL/uL (ref 4.22–5.81)
RDW: 13 % (ref 11.5–15.5)
WBC: 8 10*3/uL (ref 4.0–10.5)
nRBC: 0 % (ref 0.0–0.2)

## 2022-04-10 LAB — TROPONIN I (HIGH SENSITIVITY)
Troponin I (High Sensitivity): 9 ng/L (ref <18)
Troponin I (High Sensitivity): 9 ng/L (ref <18)

## 2022-04-10 LAB — PROTIME-INR
INR: 1.1 (ref 0.8–1.2)
Prothrombin Time: 13.7 seconds (ref 11.4–15.2)

## 2022-04-10 LAB — BRAIN NATRIURETIC PEPTIDE: B Natriuretic Peptide: 120.5 pg/mL — ABNORMAL HIGH (ref 0.0–100.0)

## 2022-04-10 MED ORDER — METOPROLOL TARTRATE 5 MG/5ML IV SOLN
2.5000 mg | Freq: Once | INTRAVENOUS | Status: AC
  Administered 2022-04-10: 2.5 mg via INTRAVENOUS
  Filled 2022-04-10: qty 5

## 2022-04-10 MED ORDER — ADENOSINE 6 MG/2ML IV SOLN
6.0000 mg | Freq: Once | INTRAVENOUS | Status: DC
  Filled 2022-04-10: qty 2

## 2022-04-10 MED ORDER — AMIODARONE HCL 200 MG PO TABS: ORAL_TABLET | ORAL | 0 refills | Status: DC

## 2022-04-10 MED ORDER — SODIUM CHLORIDE 0.9 % IV BOLUS
500.0000 mL | Freq: Once | INTRAVENOUS | Status: AC
  Administered 2022-04-10: 500 mL via INTRAVENOUS

## 2022-04-10 MED ORDER — PROPOFOL 10 MG/ML IV BOLUS
0.5000 mg/kg | Freq: Once | INTRAVENOUS | Status: AC
  Administered 2022-04-10: 49.2 mg via INTRAVENOUS
  Filled 2022-04-10: qty 20

## 2022-04-10 NOTE — Patient Instructions (Signed)
Start Amiodarone '200mg'$  -- take 1 tablet twice a day WITH FOOD for 1 month then will reduce to 1 tablet daily

## 2022-04-10 NOTE — ED Provider Notes (Addendum)
[facility] DE[facility] Provider Note   CSN: [id number] Arrival date & time: 04/09/22  2350     History  Chief Complaint  Patient presents with   Palpitations    Sean Stanley is a 82 y.o. male.  Patient with a history of atrial fibrillation, atrial flutter, thoracic aortic aneurysm, BPH presenting with palpitations and lightheadedness.  Symptoms started about 8 PM while he was playing a game.  Concerned he could be in atrial fibrillation or flutter again.  He does have a loop recorder.  He was cardioverted for the same 5 days ago.  He was seen in the cardiology office yesterday and had no medication adjustments done.  He was told to take an extra metoprolol if he was having symptoms which he did this evening.  He has been compliant with his medications including his Eliquis as well as his metoprolol.  He denies any chest pain or shortness of breath.  Denies any weakness, numbness or tingling.  Does feel somewhat lightheaded.  No focal weakness.  Feeling somewhat better compared to when he was at home.  The history is provided by the patient.  Palpitations Associated symptoms: no cough, no nausea, no shortness of breath, no vomiting and no weakness        Home Medications Prior to Admission medications   Medication Sig Start Date End Date Taking? Authorizing Provider  acetaminophen (TYLENOL) 500 MG tablet Take 1,000 mg by mouth daily as needed for moderate pain.    [provider]  alfuzosin (UROXATRAL) 10 MG 24 hr tablet Take 10 mg by mouth at bedtime.     [provider]  apixaban (ELIQUIS) 5 MG TABS tablet Take 1 tablet (5 mg total) by mouth 2 (two) times daily. 05/28/21   Fenton, Clint R, PA  buPROPion (WELLBUTRIN XL) 150 MG 24 hr tablet Take 1 tablet (150 mg total) by mouth daily. 05/22/21   Dohmeier, Asencion Partridge, MD  Cholecalciferol (VITAMIN D) 2000 units tablet Take 2,000 Units by mouth at bedtime.     [provider]  ezetimibe (ZETIA) 10  MG tablet Take 1 tablet (10 mg total) by mouth every morning. 03/07/21   Freada Bergeron, MD  finasteride (PROSCAR) 5 MG tablet Take 5 mg by mouth daily with supper. 09/23/14   [provider]  influenza vaccine adjuvanted (FLUAD) 0.5 ML injection Inject into the muscle. 01/07/22     levothyroxine (SYNTHROID) 175 MCG tablet Take 1 tablet (175 mcg total) by mouth daily. 05/25/19   Heath Lark, MD  lidocaine-prilocaine (EMLA) cream Apply to affected area once before sleep time- 05/22/21   Dohmeier, Asencion Partridge, MD  metoprolol succinate (TOPROL-XL) 25 MG 24 hr tablet Take 12.5 mg by mouth daily. 06/11/16   [provider]  Polyethyl Glycol-Propyl Glycol (SYSTANE HYDRATION PF OP) Place 1 drop into both eyes 3 (three) times daily.    [provider]  rosuvastatin (CRESTOR) 5 MG tablet Take 1 tablet (5 mg total) by mouth every Monday, Wednesday, and Friday. 09/07/21   Freada Bergeron, MD      Allergies    Levaquin [levofloxacin in d5w], Nickel, Pravastatin, Simvastatin, Statins, Allopurinol, Ampicillin, and Penicillin g    Review of Systems   Review of Systems  Constitutional:  Negative for activity change, appetite change and fever.  HENT:  Negative for congestion and rhinorrhea.   Respiratory:  Negative for cough, chest tightness and shortness of breath.   Cardiovascular:  Positive for palpitations.  Gastrointestinal:  Negative  for abdominal pain, nausea and vomiting.  Genitourinary:  Negative for dysuria and hematuria.  Musculoskeletal:  Negative for arthralgias.  Skin:  Negative for rash.  Neurological:  Positive for light-headedness. Negative for weakness.   all other systems are negative except as noted in the HPI and PMH.    Physical Exam Updated Vital Signs BP 110/68   Pulse (!) 112   Temp 97.6 F (36.4 C) (Oral)   Resp 18   Ht '6\' 1"'$  (1.854 m)   Wt 98.4 kg   SpO2 97%   BMI 28.62 kg/m  Physical Exam Vitals and nursing note reviewed.  Constitutional:       General: He is not in acute distress.    Appearance: He is well-developed.  HENT:     Head: Normocephalic and atraumatic.     Mouth/Throat:     Pharynx: No oropharyngeal exudate.  Eyes:     Conjunctiva/sclera: Conjunctivae normal.     Pupils: Pupils are equal, round, and reactive to light.  Neck:     Comments: No meningismus. Cardiovascular:     Rate and Rhythm: Regular rhythm. Tachycardia present.     Heart sounds: Normal heart sounds. No murmur heard. Pulmonary:     Effort: Pulmonary effort is normal. No respiratory distress.     Breath sounds: Normal breath sounds.  Abdominal:     Palpations: Abdomen is soft.     Tenderness: There is no abdominal tenderness. There is no guarding or rebound.  Musculoskeletal:        General: No tenderness. Normal range of motion.     Cervical back: Normal range of motion and neck supple.  Skin:    General: Skin is warm.  Neurological:     Mental Status: He is alert and oriented to person, place, and time.     Cranial Nerves: No cranial nerve deficit.     Motor: No abnormal muscle tone.     Coordination: Coordination normal.     Comments: No ataxia on finger to nose bilaterally. No pronator drift. 5/5 strength throughout. CN 2-12 intact.Equal grip strength. Sensation intact.   Psychiatric:        Behavior: Behavior normal.     ED Results / Procedures / Treatments   Labs (all labs ordered are listed, but only abnormal results are displayed) Labs Reviewed  BASIC METABOLIC PANEL - Abnormal; Notable for the following components:      Result Value   Glucose, Bld 104 (*)    BUN 29 (*)    All other components within normal limits  CBC - Abnormal; Notable for the following components:   Platelets 143 (*)    All other components within normal limits  BRAIN NATRIURETIC PEPTIDE - Abnormal; Notable for the following components:   B Natriuretic Peptide 120.5 (*)    All other components within normal limits  PROTIME-INR  TROPONIN I (HIGH  SENSITIVITY)  TROPONIN I (HIGH SENSITIVITY)    EKG EKG Interpretation  Date/Time:  Wednesday April 10 2022 00:04:33 EST Ventricular Rate:  113 PR Interval:    QRS Duration: 100 QT Interval:  192 QTC Calculation: 263 R Axis:   28 Text Interpretation: Atrial flutter with 2:1 A-V conduction Nonspecific ST and T wave abnormality Abnormal ECG When compared with ECG of 05-Apr-2022 13:46, PREVIOUS ECG IS PRESENT question atrial flutter but P waves present Confirmed by Ezequiel Essex [phone]) on 04/10/2022 12:28:40 AM  Radiology DG Chest Portable 1 View  Result Date: 04/10/2022 CLINICAL DATA:  Palpitations. EXAM:  PORTABLE CHEST 1 VIEW COMPARISON:  05/27/2021. FINDINGS: Heart is enlarged and the mediastinal contour stable. There is atherosclerotic calcification of the aorta. Mild atelectasis or scarring is noted at the right lung base. A density is noted along the right lateral side wall, compatible with focal fat on CT. Loop recorder device is present over the left chest. No acute osseous abnormality. A stable wire is noted over the right chest. IMPRESSION: No active disease. Electronically Signed   By: Brett Fairy M.D.   On: 04/10/2022 00:27    Procedures .Sedation  Date/Time: 04/10/2022 2:37 AM  Performed by: Ezequiel Essex, MD Authorized by: Ezequiel Essex, MD   Consent:    Consent obtained:  Written   Consent given by:  Patient   Risks discussed:  Allergic reaction, dysrhythmia, nausea, vomiting, respiratory compromise necessitating ventilatory assistance and intubation, prolonged sedation necessitating reversal, prolonged hypoxia resulting in organ damage and inadequate sedation   Alternatives discussed:  Analgesia without sedation Universal protocol:    Procedure explained and questions answered to patient or proxy's satisfaction: yes     Relevant documents present and verified: yes     Test results available: yes     Imaging studies available: yes     Required blood  products, implants, devices, and special equipment available: yes     Site/side marked: yes     Immediately prior to procedure, a time out was called: yes     Patient identity confirmed:  Provided demographic data and verbally with patient Indications:    Procedure performed:  Cardioversion   Procedure necessitating sedation performed by:  Physician performing sedation Pre-sedation assessment:    Time since last food or drink:  6   NPO status caution: urgency dictates proceeding with non-ideal NPO status     ASA classification: class 3 - patient with severe systemic disease     Mouth opening:  3 or more finger widths   Thyromental distance:  4 finger widths   Mallampati score:  I - soft palate, uvula, fauces, pillars visible   Neck mobility: normal     Pre-sedation assessments completed and reviewed: airway patency, cardiovascular function, hydration status, mental status, nausea/vomiting, pain level, respiratory function and temperature     Pre-sedation assessment completed:  04/10/2022 2:35 AM Immediate pre-procedure details:    Reassessment: Patient reassessed immediately prior to procedure     Reviewed: vital signs, relevant labs/tests and NPO status     Verified: bag valve mask available, emergency equipment available, intubation equipment available, IV patency confirmed, oxygen available, reversal medications available and suction available   Procedure details (see MAR for exact dosages):    Preoxygenation:  Nasal cannula   Sedation:  Propofol   Intended level of sedation: deep   Analgesia:  None   Intra-procedure monitoring:  Blood pressure monitoring, cardiac monitor, continuous pulse oximetry, continuous capnometry, frequent LOC assessments and frequent vital sign checks   Intra-procedure events: none     Total Provider sedation time (minutes):  10 Post-procedure details:    Post-sedation assessment completed:  04/10/2022 2:38 AM   Attendance: Constant attendance by certified  staff until patient recovered     Recovery: Patient returned to pre-procedure baseline     Post-sedation assessments completed and reviewed: airway patency, cardiovascular function, hydration status, mental status, nausea/vomiting, pain level, respiratory function and temperature     Patient is stable for discharge or admission: yes     Procedure completion:  Tolerated well, no immediate complications .Cardioversion  Date/Time: 04/10/2022 2:39  AM  Performed by: Ezequiel Essex, MD Authorized by: Ezequiel Essex, MD   Consent:    Consent obtained:  Written   Consent given by:  Patient   Risks discussed:  Cutaneous burn, death, induced arrhythmia and pain   Alternatives discussed:  No treatment Pre-procedure details:    Cardioversion basis:  Emergent   Rhythm:  Atrial flutter   Electrode placement:  Anterior-posterior Patient sedated: Yes. Refer to sedation procedure documentation for details of sedation.  Attempt one:    Cardioversion mode:  Synchronous   Waveform:  Biphasic   Shock (Joules):  150   Shock outcome:  Conversion to normal sinus rhythm Post-procedure details:    Patient status:  Awake   Patient tolerance of procedure:  Tolerated well, no immediate complications .Critical Care  Performed by: Ezequiel Essex, MD Authorized by: Ezequiel Essex, MD   Critical care provider statement:    Critical care time (minutes):  35   Critical care time was exclusive of:  Separately billable procedures and treating other patients   Critical care was necessary to treat or prevent imminent or life-threatening deterioration of the following conditions: arrhythmia.   Critical care was time spent personally by me on the following activities:  Development of treatment plan with patient or surrogate, discussions with consultants, evaluation of patient's response to treatment, examination of patient, ordering and review of laboratory studies, ordering and review of radiographic studies,  ordering and performing treatments and interventions, pulse oximetry, re-evaluation of patient's condition, review of old charts and obtaining history from patient or surrogate   I assumed direction of critical care for this patient from another provider in my specialty: no     Care discussed with: admitting provider       Medications Ordered in ED Medications  metoprolol tartrate (LOPRESSOR) injection 2.5 mg (has no administration in time range)    ED Course/ Medical Decision Making/ A&P                             Medical Decision Making Amount and/or Complexity of Data Reviewed Labs: ordered. Decision-making details documented in ED Course. Radiology: ordered and independent interpretation performed. Decision-making details documented in ED Course. ECG/medicine tests: ordered and independent interpretation performed. Decision-making details documented in ED Course.  Risk Prescription drug management.   Patient with palpitations since approximately 8 PM.  No chest pain or shortness of breath.  EKG shows atrial flutter with 2:1 conduction but also appears to have P waves present.  EKG discussed with Dr. Koleen Nimrod of cardiology.  He agrees suspicious for atrial flutter.  Recommends adenosine trial followed by cardioversion if flutter waves are present.  Labs reassuring.  Troponin negative with low suspicion for ACS.  Risk and benefits of cardioversion discussed with patient.  He agrees to proceed after much deliberation.  He has not had any missed doses of Eliquis.  Cardioversion successful as above.  Patient back in sinus rhythm.  Troponin negative x2.  Patient feels improved.  Follow-up with his cardiologist for medication adjustments this week.  Return precautions discussed.       Final Clinical Impression(s) / ED Diagnoses Final diagnoses:  None    Rx / DC Orders ED Discharge Orders     None         Carine Nordgren, Annie Main, MD 04/10/22 [id number]    Ezequiel Essex, MD 04/22/22 [phone]

## 2022-04-10 NOTE — Discharge Instructions (Signed)
Take your medications as prescribed and follow-up with your cardiologist this week.  Return to the ED with chest pain, shortness of breath, palpitations or other concerns.

## 2022-04-10 NOTE — ED Notes (Signed)
Patient placed on ETCO2 monitor. Ambu bag and suction functional at bedside.

## 2022-04-10 NOTE — ED Triage Notes (Signed)
POV from home, pt c/o feeling like his heart is racing since 2030, took extra metoprolol per cardiology recommendation, pt was cardioverted on Friday for same. Anticoagulated on elliquis. NAD, A&O x 4. Amb to triage.

## 2022-04-10 NOTE — Progress Notes (Signed)
Primary Care Physician: Kathalene Frames, MD Referring Physician: ER f/u  Prior Cardiologist: Dr. Edwin Dada ([facility]), recently transferred over to Dr. Johney Frame Primary EP: Dr Charissa Bash is a 82 y.o. male with a h/o  MV repair 2002,  multi abdominal aneurysms with prior bleeding,(followed at [facility]),  unable to take anticoagulation, h/o atrial flutter, that is in the afib clinic, for onset of palpitations 8/9. He was found to be in rapid atrial fib at 143 bpm. He could not be cardioverted as he is not on anticoagulation 2/2 to multiple abdominal  aneurysms. He converted as IV was being started.  He was watched for another 3 hours and then d/c home with f/u here.  He is on metoprolol daily.   He states h/o ablation in 2015 at [facility] and had Covid the end of July. This is the first arrhythmia he has dad since ablation. He is in SR today.   Follow up in the AF clinic 05/28/21. Patient called [facility] with elevated heart rates and was instructed to take an additional dose of metoprolol and present to the ED if his symptoms did not improved. Patient was seen at the ED 05/27/21 with atypical atrial flutter. Rate control with IV metoprolol was unsuccessful and he was discharged without any other intervention. He has symptoms of fatigue, SOB, and poor sleep.   Follow up in the AF clinic 04/10/22. Patient has been seen in the ED 1/12 and today for rapid atrial flutter. He was cardioverted both times. Unfortunately, he was back out of rhythm after he returned home. He is in atrial flutter today with symptoms of fatigue and SOB with exertion. There does not appear to be any specific triggers.   Today, he denies symptoms of chest pain, orthopnea, PND, lower extremity edema, dizziness, presyncope, syncope, or neurologic sequela. The patient is tolerating medications without difficulties and is otherwise without complaint today.   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   s/p coiling 12/16/12 - [facility]   Atrial flutter ([facility])    BPH (benign prostatic hyperplasia)    Cancer ([place])    melanoma on lower right back and left chest - surgically removed and cleared   Difficult intubation    Dysrhythmia    H/O agent Orange exposure    Headache    migraine- not current   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 Maxwell Caul told him he had improved   Paroxysmal atrial fibrillation ([place]) 01/02/2021   Pneumonia    Thoracic ascending aortic aneurysm ([facility])    4.5 cm 03/2018 CT. 4.4 cm on echo 10/2021 and on CTA 03/2021   Thyroid cancer ([place])  Past Surgical History:  Procedure Laterality Date   ABDOMINAL ANGIOGRAM  08/05/2012   aneurysm repair     CARDIOVERSION  02/12/2012   Procedure: CARDIOVERSION;  Surgeon: Pixie Casino, MD;  Location: [place];  Service: Cardiovascular;  Laterality: N/A;   CARDIOVERSION N/A 06/22/2013   Procedure: CARDIOVERSION;  Surgeon: [name], MD;  Location: [place];  Service: Cardiovascular;  Laterality: N/A;   CARDIOVERSION N/A 06/19/2021   Procedure: CARDIOVERSION;  Surgeon: Werner Lean, MD;  Location: [facility];  Service: Cardiovascular;  Laterality: N/A;   CATARACT EXTRACTION Bilateral 2018   with lens implant   CHOLECYSTECTOMY      COLONOSCOPY     ESOPHAGOGASTRODUODENOSCOPY     implantable loop recorder placement  03/29/2021   Medtronic Reveal Linq model LNQ 22 [id number]) implantable loop recorder   IR IMAGING GUIDED PORT INSERTION  05/26/2018   IR REMOVAL TUN ACCESS W/ PORT W/O FL MOD SED  02/05/2021   LYMPH NODE BIOPSY Left 04/27/2018   Procedure: EXCISIONAL BIOPSY OF LEFT CERVICAL LYMPH NODE;  Surgeon: Melida Quitter, MD;  Location: [place];  Service: ENT;  Laterality: Left;   LYMPH NODE BIOPSY Left 11/23/2018   Procedure: LEFT CERVICAL LYMPH NODE OPEN BIOPSY;  Surgeon: Melida Quitter, MD;  Location: [place];  Service: ENT;  Laterality: Left;   MENISCUS REPAIR Right 2009   MITRAL VALVE REPAIR  2002   [facility]   NM MYOVIEW LTD  07/22/2006   no ischemia   RADICAL NECK DISSECTION Left 01/15/2019   Procedure: LEFT NECK DISSECTION;  Surgeon: Melida Quitter, MD;  Location: [place];  Service: ENT;  Laterality: Left;   RIGHT HEART CATH  06/19/2004   normal right heart dynamics. EF 50%   TEE WITHOUT CARDIOVERSION  02/12/2012   Procedure: TRANSESOPHAGEAL ECHOCARDIOGRAM (TEE);  Surgeon: Pixie Casino, MD;  Location: [facility] ENDOSCOPY;  Service: Cardiovascular;  Laterality: N/A;   TEE WITHOUT CARDIOVERSION N/A 06/22/2013   Procedure: TRANSESOPHAGEAL ECHOCARDIOGRAM (TEE);  Surgeon: [name], MD;  Location: [place];  Service: Cardiovascular;  Laterality: N/A;   THYROIDECTOMY N/A 01/15/2019   Procedure: TOTAL THYROIDECTOMY;  Surgeon: Melida Quitter, MD;  Location[facility] OR;  Service: ENT;  Laterality: N/A;    Current Outpatient Medications  Medication Sig Dispense Refill   acetaminophen (TYLENOL) 500 MG tablet Take 1,000 mg by mouth daily as needed for moderate pain.     alfuzosin (UROXATRAL) 10 MG 24 hr tablet Take 10 mg by mouth at bedtime.      apixaban (ELIQUIS) 5 MG TABS tablet Take 1 tablet (5 mg total) by mouth 2 (two) times daily. 60 tablet 3   buPROPion (WELLBUTRIN XL) 150 MG 24 hr tablet Take 1 tablet (150 mg  total) by mouth daily. 90 tablet 3   Cholecalciferol (VITAMIN D) 2000 units tablet Take 2,000 Units by mouth at bedtime.      ezetimibe (ZETIA) 10 MG tablet Take 1 tablet (10 mg total) by mouth every morning. 90 tablet 2   finasteride (PROSCAR) 5 MG tablet Take 5 mg by mouth daily with supper.     influenza vaccine adjuvanted (FLUAD) 0.5 ML injection Inject into the muscle. 0.5 mL 0   levothyroxine (SYNTHROID) 175 MCG tablet Take 1 tablet (175 mcg total) by mouth daily.     lidocaine-prilocaine (EMLA) cream Apply to affected area once before sleep time- 30 g 3   metoprolol succinate (TOPROL-XL) 25 MG 24 hr tablet Take 12.5 mg by mouth daily.  Polyethyl Glycol-Propyl Glycol (SYSTANE HYDRATION PF OP) Place 1 drop into both eyes 3 (three) times daily.     rosuvastatin (CRESTOR) 5 MG tablet Take 1 tablet (5 mg total) by mouth every Monday, Wednesday, and Friday. 36 tablet 3   No current facility-administered medications for this encounter.    Allergies  Allergen Reactions   Levaquin [Levofloxacin In D5w]     tendonitis   Nickel Itching   Pravastatin     Muscle aches   Simvastatin     Chest pain   Statins Other (See Comments)    Muscle aches   Allopurinol Rash   Ampicillin Rash    Did it involve swelling of the face/tongue/throat, SOB, or low BP? No Did it involve sudden or severe rash/hives, skin peeling, or any reaction on the inside of your mouth or nose? Yes Did you need to seek medical attention at a hospital or doctor's office? Yes When did it last happen?      50+ years If all above answers are "NO", may proceed with cephalosporin use.    Penicillin G Rash    Social History   Socioeconomic History   Marital status: Married    Spouse name: [name]   Number of children: 2   Years of education: Not on file   Highest education level: Not on file  Occupational History   Occupation: retired Hydrologist  Tobacco Use   Smoking status: Never   Smokeless tobacco: Never   Tobacco  comments:    Never smoke 04/10/22  Vaping Use   Vaping Use: Never used  Substance and Sexual Activity   Alcohol use: Not Currently    Comment: social   Drug use: No   Sexual activity: Not on file  Other Topics Concern   Not on file  Social History Narrative   Lives in [place], Retired   Science writer Determinants of Health   Financial Resource Strain: Not on file  Food Insecurity: Not on file  Transportation Needs: Not on file  Physical Activity: Not on file  Stress: Not on file  Social Connections: Not on file  Intimate Partner Violence: Not on file    Family History  Problem Relation Age of Onset   Parkinson's disease Mother 12   Heart failure Father 57   Cancer Maternal Grandmother    Heart attack Maternal Grandfather    Heart attack Paternal Grandmother    Heart attack Paternal Grandfather     ROS- All systems are reviewed and negative except as per the HPI above  Physical Exam: Vitals:   04/10/22 1513  BP: 118/70  Pulse: (!) 114  Weight: 101.1 kg  Height: '6\' 1"'$  (1.854 m)    Wt Readings from Last 3 Encounters:  04/10/22 101.1 kg  04/10/22 98.4 kg  04/08/22 98.4 kg    Labs: Lab Results  Component Value Date   NA 139 04/10/2022   K 4.4 04/10/2022   CL 105 04/10/2022   CO2 24 04/10/2022   GLUCOSE 104 (H) 04/10/2022   BUN 29 (H) 04/10/2022   CREATININE 1.21 04/10/2022   CALCIUM 9.3 04/10/2022   PHOS 3.4 08/11/2012   MG 2.2 04/05/2022   Lab Results  Component Value Date   INR 1.1 04/10/2022   Lab Results  Component Value Date   CHOL 156 09/03/2021   HDL 61 09/03/2021   LDLCALC 78 09/03/2021   TRIG 92 09/03/2021    GEN- The patient is a well appearing elderly male, alert and oriented x  3 today.   HEENT-head normocephalic, atraumatic, sclera clear, conjunctiva pink, hearing intact, trachea midline. Lungs- Clear to ausculation bilaterally, normal work of breathing Heart- irregular rate and rhythm, no murmurs, rubs or gallops  GI- soft, NT, ND,  + BS Extremities- no clubbing, cyanosis, or edema MS- no significant deformity or atrophy Skin- no rash or lesion Psych- euthymic mood, full affect Neuro- strength and sensation are intact   EKG-  Atypical atrial flutter with 2:1 block Vent. rate 114 BPM PR interval 194 ms QRS duration 100 ms QT/QTcB 412/567 ms  Epic records reviewed   Echo 11/08/21  1. Left ventricular ejection fraction, by estimation, is 50 to 55%. The left ventricle has normal function. The left ventricle has no regional wall motion abnormalities. Left ventricular diastolic function could not be evaluated.   2. Right ventricular systolic function is normal. The right ventricular size is normal. There is normal pulmonary artery systolic pressure.   3. Left atrial size was severely dilated.   4. Right atrial size was severely dilated.   5. The mitral valve has been repaired/replaced. No evidence of mitral valve regurgitation. No evidence of mitral stenosis. Procedure Date: 2002.   6. The aortic valve is tricuspid. Aortic valve regurgitation is not  visualized. Aortic valve sclerosis is present, with no evidence of aortic valve stenosis.   7. There is moderate dilatation of the aortic root, measuring 40 mm. There is moderate dilatation of the ascending aorta, measuring 44 mm.   8. The inferior vena cava is normal in size with greater than 50%  respiratory variability, suggesting right atrial pressure of 3 mmHg.    CHA2DS2-VASc Score = 4  The patient's score is based upon: CHF History: 1 HTN History: 0 Diabetes History: 0 Stroke History: 0 Vascular Disease History: 1 Age Score: 2 Gender Score: 0       ASSESSMENT AND PLAN: 1. Persistent Atrial Fibrillation/atypical atrial flutter The patient's CHA2DS2-VASc score is 4, indicating a 4.8% annual risk of stroke.   Patient s/p DCCV on 04/05/22 and 04/10/22 with quick return of his atrial flutter. We discussed rhythm control options today including AAD and ablation.  He does have severe biatrial enlargement, unclear if he would be a candidate for ablation. After discussing the risks and benefits, will start amiodarone 200 mg BID for one month then decrease to once daily. Recent lab work and CXR reviewed. If he does not chemically convert, will plan for DCCV after amiodarone load. Continue Toprol 12.5 mg daily  2. Secondary Hypercoagulable State (ICD10:  D68.69) The patient is at significant risk for stroke/thromboembolism based upon his CHA2DS2-VASc Score of 4.  Continue Apixaban (Eliquis).   3. Valvular heart disease Prior MV repair Followed by Dr Johney Frame   4. NICM EF normalized Fluid status appears stable.    Follow up in the AF clinic next week.    [facility] [address] [place], [place] [address] [phone]

## 2022-04-10 NOTE — ED Notes (Signed)
RT at bedside throughout procedure. Patient tolerated well. Airway patent, SpO2 98%

## 2022-04-11 ENCOUNTER — Inpatient Hospital Stay (HOSPITAL_BASED_OUTPATIENT_CLINIC_OR_DEPARTMENT_OTHER): Payer: Medicare Other | Admitting: Hematology and Oncology

## 2022-04-11 ENCOUNTER — Telehealth: Payer: Self-pay | Admitting: Cardiology

## 2022-04-11 ENCOUNTER — Encounter: Payer: Self-pay | Admitting: Hematology and Oncology

## 2022-04-11 ENCOUNTER — Inpatient Hospital Stay: Payer: Medicare Other | Attending: Hematology and Oncology

## 2022-04-11 VITALS — BP 96/62 | HR 115 | Resp 18 | Ht 73.0 in | Wt 221.8 lb

## 2022-04-11 DIAGNOSIS — Z85850 Personal history of malignant neoplasm of thyroid: Secondary | ICD-10-CM | POA: Insufficient documentation

## 2022-04-11 DIAGNOSIS — C8310 Mantle cell lymphoma, unspecified site: Secondary | ICD-10-CM | POA: Diagnosis not present

## 2022-04-11 DIAGNOSIS — C8318 Mantle cell lymphoma, lymph nodes of multiple sites: Secondary | ICD-10-CM | POA: Diagnosis not present

## 2022-04-11 DIAGNOSIS — Z7901 Long term (current) use of anticoagulants: Secondary | ICD-10-CM | POA: Insufficient documentation

## 2022-04-11 DIAGNOSIS — R000 Tachycardia, unspecified: Secondary | ICD-10-CM | POA: Insufficient documentation

## 2022-04-11 DIAGNOSIS — I959 Hypotension, unspecified: Secondary | ICD-10-CM | POA: Diagnosis not present

## 2022-04-11 DIAGNOSIS — I4891 Unspecified atrial fibrillation: Secondary | ICD-10-CM | POA: Diagnosis not present

## 2022-04-11 DIAGNOSIS — Z79899 Other long term (current) drug therapy: Secondary | ICD-10-CM | POA: Insufficient documentation

## 2022-04-11 DIAGNOSIS — C73 Malignant neoplasm of thyroid gland: Secondary | ICD-10-CM

## 2022-04-11 DIAGNOSIS — F418 Other specified anxiety disorders: Secondary | ICD-10-CM | POA: Diagnosis not present

## 2022-04-11 DIAGNOSIS — G4700 Insomnia, unspecified: Secondary | ICD-10-CM | POA: Diagnosis not present

## 2022-04-11 DIAGNOSIS — N400 Enlarged prostate without lower urinary tract symptoms: Secondary | ICD-10-CM | POA: Diagnosis not present

## 2022-04-11 DIAGNOSIS — E7800 Pure hypercholesterolemia, unspecified: Secondary | ICD-10-CM | POA: Diagnosis not present

## 2022-04-11 LAB — CMP (CANCER CENTER ONLY)
ALT: 28 U/L (ref 0–44)
AST: 20 U/L (ref 15–41)
Albumin: 4 g/dL (ref 3.5–5.0)
Alkaline Phosphatase: 58 U/L (ref 38–126)
Anion gap: 5 (ref 5–15)
BUN: 25 mg/dL — ABNORMAL HIGH (ref 8–23)
CO2: 30 mmol/L (ref 22–32)
Calcium: 9.3 mg/dL (ref 8.9–10.3)
Chloride: 107 mmol/L (ref 98–111)
Creatinine: 1.43 mg/dL — ABNORMAL HIGH (ref 0.61–1.24)
GFR, Estimated: 50 mL/min — ABNORMAL LOW (ref >60)
Glucose, Bld: 83 mg/dL (ref 70–99)
Potassium: 4.3 mmol/L (ref 3.5–5.1)
Sodium: 142 mmol/L (ref 135–145)
Total Bilirubin: 1.3 mg/dL — ABNORMAL HIGH (ref 0.3–1.2)
Total Protein: 6 g/dL — ABNORMAL LOW (ref 6.5–8.1)

## 2022-04-11 LAB — CBC WITH DIFFERENTIAL (CANCER CENTER ONLY)
Abs Immature Granulocytes: 0.05 10*3/uL (ref 0.00–0.07)
Basophils Absolute: 0.1 10*3/uL (ref 0.0–0.1)
Basophils Relative: 1 %
Eosinophils Absolute: 0.2 10*3/uL (ref 0.0–0.5)
Eosinophils Relative: 2 %
HCT: 41.6 % (ref 39.0–52.0)
Hemoglobin: 14.4 g/dL (ref 13.0–17.0)
Immature Granulocytes: 1 %
Lymphocytes Relative: 16 %
Lymphs Abs: 1.1 10*3/uL (ref 0.7–4.0)
MCH: 31.8 pg (ref 26.0–34.0)
MCHC: 34.6 g/dL (ref 30.0–36.0)
MCV: 91.8 fL (ref 80.0–100.0)
Monocytes Absolute: 0.7 10*3/uL (ref 0.1–1.0)
Monocytes Relative: 10 %
Neutro Abs: 4.9 10*3/uL (ref 1.7–7.7)
Neutrophils Relative %: 70 %
Platelet Count: 145 10*3/uL — ABNORMAL LOW (ref 150–400)
RBC: 4.53 MIL/uL (ref 4.22–5.81)
RDW: 13 % (ref 11.5–15.5)
WBC Count: 6.9 10*3/uL (ref 4.0–10.5)
nRBC: 0 % (ref 0.0–0.2)

## 2022-04-11 NOTE — Telephone Encounter (Signed)
Pt c/o medication issue:  1. Name of Medication: amiodarone (PACERONE) 200 MG tablet   2. How are you currently taking this medication (dosage and times per day)? Not currently taking   3. Are you having a reaction (difficulty breathing--STAT)? No   4. What is your medication issue? Patient's spouse is calling wanting to discuss this medication prior to patient starting it due to being fearful of the research they have done regarding it.

## 2022-04-11 NOTE — Progress Notes (Signed)
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 He is reassured He will continue surveillance CT imaging at [facility] once a year and I will monitor her for recurrence of lymphoma I will see him again in 6 months for further follow-up  Thyroid cancer [facility]) He has persistent neck discomfort due to prior surgery He has no clinical signs of recurrence  Hypotension He has clinical hypotension and tachycardia I suspect this is related to his medications We also discussed importance of fluid hydration I will defer to his cardiologist for management  Orders Placed This Encounter  Procedures   Comprehensive metabolic panel    Standing Status:   Standing    Number of Occurrences:   33    Standing Expiration Date:   04/12/2023   CBC with Differential/Platelet    Standing Status:   Standing    Number of Occurrences:   22    Standing Expiration Date:   04/12/2023    All questions were answered. The patient knows to call the clinic with any problems, questions or concerns. The total time spent in the appointment was 25 minutes encounter with patients including review of chart and various tests results, discussions about plan of care and coordination of care plan   Heath Lark, MD 04/11/2022 1:24 PM  INTERVAL HISTORY: Please see below for problem oriented charting. he returns for surveillance follow-up for history of lymphoma and thyroid cancer He has been to the emergency room several times recently due to atrial flutter He continues to have occasional palpitation His blood pressure is noted to be low and he is not  symptomatic He felt that he is drinking enough water.  He has frequent urination No new lymphadenopathy or recent infection  REVIEW OF SYSTEMS:   Constitutional: Denies fevers, chills or abnormal weight loss Eyes: Denies blurriness of vision Ears, nose, mouth, throat, and face: Denies mucositis or sore throat Respiratory: Denies cough, dyspnea or wheezes Gastrointestinal:  Denies nausea, heartburn or change in bowel habits Skin: Denies abnormal skin rashes Lymphatics: Denies new lymphadenopathy or easy bruising Neurological:Denies numbness, tingling or new weaknesses Behavioral/Psych: Mood is stable, no new changes  All other systems were reviewed with the patient and are negative.  I have reviewed the past medical history, past surgical history, social history and family history with the patient and they are unchanged from previous note.  ALLERGIES:  is allergic to levaquin [levofloxacin in d5w], nickel, pravastatin, simvastatin, statins, allopurinol, ampicillin, and penicillin g.  MEDICATIONS:  Current Outpatient Medications  Medication Sig Dispense Refill   acetaminophen (TYLENOL) 500 MG tablet Take 1,000 mg by mouth daily as needed for moderate pain.     alfuzosin (UROXATRAL) 10 MG 24 hr tablet Take 10 mg by mouth at bedtime.      amiodarone (PACERONE) 200 MG tablet Take 1 tablet by mouth twice a day for 1 month then reduce to 1 tablet daily 60 tablet 0   apixaban (ELIQUIS) 5 MG TABS tablet Take 1 tablet (5 mg total) by mouth 2 (two) times daily. 60 tablet 3   buPROPion (WELLBUTRIN XL) 150 MG 24 hr tablet Take 1 tablet (150 mg total) by mouth daily.  90 tablet 3   Cholecalciferol (VITAMIN D) 2000 units tablet Take 2,000 Units by mouth at bedtime.      ezetimibe (ZETIA) 10 MG tablet Take 1 tablet (10 mg total) by mouth every morning. 90 tablet 2   finasteride (PROSCAR) 5 MG tablet Take 5 mg by mouth daily with supper.     influenza vaccine adjuvanted (FLUAD) 0.5 ML injection Inject  into the muscle. 0.5 mL 0   levothyroxine (SYNTHROID) 175 MCG tablet Take 1 tablet (175 mcg total) by mouth daily.     lidocaine-prilocaine (EMLA) cream Apply to affected area once before sleep time- 30 g 3   metoprolol succinate (TOPROL-XL) 25 MG 24 hr tablet Take 12.5 mg by mouth daily.     Polyethyl Glycol-Propyl Glycol (SYSTANE HYDRATION PF OP) Place 1 drop into both eyes 3 (three) times daily.     rosuvastatin (CRESTOR) 5 MG tablet Take 1 tablet (5 mg total) by mouth every Monday, Wednesday, and Friday. 36 tablet 3   No current facility-administered medications for this visit.    SUMMARY OF ONCOLOGIC HISTORY: Oncology History Overview Note  MIPI score 7.5 high risk   Mantle cell lymphoma ([place])  06/13/2017 Imaging   Ct imaging at [facility] 1. 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 2. 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 3. Slight reduction in size of right retroperitoneal evolving hematoma.   03/30/2018 Imaging   Ct neck 1. Extensive adenopathy on the left. The largest node is a level 1/submandibular node measuring 38 x 24 x 22 mm. Largest level 2 node measures 3.2 x 2 x 2 cm. Numerous other smaller but round lymph nodes throughout the left neck in the level 2 through level 4 region. The largest level 5 node measures 3.4 x 3.4 x 2.3 cm with a transverse diameter of 2.3 cm. This contains some internal calcifications. There are numerous other pathologic nodes in the left supraclavicular to axillary region. This pattern of disease could be due to extensive left neck metastatic disease from unknown primary, lymphoma, or other systemic malignancy. 2. No evidence of mucosal or submucosal lesion. 3. Diameter of the ascending aorta is 4.5 cm. Recommend annual imaging followup by CTA or MRA. Aortic Atherosclerosis  (ICD10-I70.0).     04/10/2018 Pathology Results   Left zone 1 neck mass, Fine Needle Aspiration I (smears and cell block):      Atypical lymphoid proliferation. See comment.       Specimen Adequacy:  Satisfactory for evaluation.  COMMENT:The aspirate demonstrates abundant small to medium sized lymphocytes with scattered epithelioid cells in the background which may be histiocytes.  The smears are cellular, but the cell block includes scant lymphocytes.  Immunohistochemical stains were attempted showing positive staining for CD20 with rare staining for CD3.  Cytokeratin AE1/AE3 is negative. S100 shows high nonspecific background staining but no specifically diagnostic cellular staining.  Overall, the findings indicate an atypical lymphoid proliferation but are too limited for definitive diagnosis.  Excisional biopsy with flow cytometry is recommended.   04/10/2018 Procedure   He was ENT who performed FNA of neck LN   04/27/2018 Pathology Results   Lymph node for lymphoma, Left zone 2 Cervical - MANTLE CELL LYMPHOMA - SEE COMMENT Microscopic Comment The biopsies have nodal architectural effacement by a monotonous lymphoid population. The lymphocytes are predominantly small in size with round nuclei  and mature, [name], [name], [name], [name], [name], [name], the features are consistent with a mantle cell lymphoma   05/13/2018 PET scan   1. Hypermetabolic left cervical lymphadenopathy, left axillary lymphadenopathy, mediastinal / right hilar lymphadenopathy, right external iliac lymphadenopathy, and  subcentimeter left external iliac and left inguinal lymph nodes, [name], [name], [name], without focal lesions, that may represent lymphomatous involvement of the spleen.   05/21/2018 Cancer Staging   Staging form: Hodgkin and Non-Hodgkin Lymphoma, AJCC 8th Edition - Clinical: Stage III - Signed by Heath Lark, MD on 05/21/2018   05/26/2018 Procedure   Ultrasound and fluoroscopically guided right internal jugular single lumen power port catheter insertion. Tip in the SVC/RA junction. Catheter ready for use.     06/01/2018 -  Chemotherapy   The patient had Bendamustine and Rituximab for chemotherapy treatment   08/24/2018 PET scan   1. Partial response to therapy with complete resolution of size and metabolic activity of the dominant LEFT submandibular node (level 1) and LEFT axillary nodes. 2. Persistent and increased metabolic activity of left level 3 lymph nodes ( Deauville 4). Nodes are partially calcified and similar size. 3. No new metastatic disease. 4. Normal spleen and bone marrow.   11/09/2018 PET scan   IMPRESSION: 1. Mild response to therapy of hypermetabolic left cervical nodes. (De[place]ville) 4. 2. No new or progressive disease. 3. Coronary artery atherosclerosis. Aortic Atherosclerosis (ICD10-I70.0). 4. Trace cul-de-sac fluid, similar.   12/18/2018 Imaging   1. 4 mm solid nodule in the right lower lobe, which is technically indeterminate. Recommend attention on follow-up per clinical protocol. 2. Small calcified nodule adjacent to the left lobe of the thyroid, which may represent thyroid nodule or calcified lymph node. Recommend correlation with thyroid ultrasound. 3. The ascending aorta appears mildly dilated, measuring up to 4.2cm. 4. Decreased left axillary lymphadenopathy.   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   Stranding with postsurgical changes along the right anterior neck anterior to the thyroid cartilage, favored to be related to recent surgery. Attention on follow-up is suggested.   No suspicious lymphadenopathy in the neck, chest, abdomen, or pelvis. Deauville criteria 1   11/22/2019 Imaging   1. No evidence of recurrent lymphoma in the chest, abdomen or pelvis. Normal size spleen. 2. 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 Recommend annual imaging followup by CTA or MRA. This recommendation follows 2010 C[facility]/AHA/AATS/ACR/ASA/SCA/SCAI/SIR/STS/SVM Guidelines for the Diagnosis and Management of Patients with Thoracic Aortic Disease. Circulation. 2010; 121: Q119-E174. Aortic aneurysm NOS (ICD10-I71.9). Moderate diffuse colonic diverticulosis. Mild prostatomegaly. Aortic Atherosclerosis (ICD10-I70.0).   05/30/2020 Imaging   Stable exam. No evidence of recurrent lymphoma or metastatic disease within the chest, abdomen, or pelvis.   Colonic diverticulosis, without radiographic evidence of diverticulitis.   Tiny nonobstructing left renal calculus.   Stable mildly enlarged prostate.   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   Aortic and coronary atherosclerotic calcification.   02/05/2021 Procedure   Removal of implanted Port-A-Cath  utilizing sharp and blunt dissection. The procedure was uncomplicated.   04/11/2021 Imaging   1. No evidence of mass or lymphadenopathy in the chest, abdomen, or pelvis. 2. Normal spleen. 3. Unchanged enlargement of the tubular ascending thoracic aorta, measuring up to 4.4 x 4.4 cm.  4. Cardiomegaly and coronary artery disease.     Thyroid cancer ([place])  11/23/2018 Pathology Results   Lymph node for lymphoma, Left zone 3 - PAPILLARY THYROID CARCINOMA. - SEE MICROSCOPIC DESCRIPTION. Microscopic Comment The specimen consists mostly of encapsulated papillary thyroid carcinoma.  There are portions of lymphoid tissue attached to the periphery of the specimen and in the adjacent adipose tissue which suggests that this may represent papillary carcinoma metastatic to a lymph node.   11/23/2018 Surgery   PREOPERATIVE DIAGNOSIS:  Mantle cell lymphoma and cervical lymphadenopathy.   POSTOPERATIVE DIAGNOSIS:  Mantle cell lymphoma and cervical lymphadenopathy.   PROCEDURE:  Excisional biopsy of left cervical lymph nodes.     01/15/2019 Pathology Results   A. SOFT TISSUE, NECK, ADJACENT TO LEFT RECURRENT NERVE, BIOPSY: - Papillary thyroid carcinoma. B. 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 C. ADDITIONAL LEFT LEVEL 4 TISSUE, EXCISION: - There is no evidence of carcinoma in 2 of 2 lymph nodes (0/2). D. TOTAL THYROIDECTOMY: - Papillary thyroid carcinoma, 0.8 cm. - Carcinoma is broadly present at an inked tissue edge. - See oncology table below. E. LEFT LEVEL 6 TISSUE, EXCISION: - Benign fibroadipose tissue. - There is no evidence of malignancy. F. LEFT RECURRENT NERVE AND SURROUNDING TISSUE, EXCISION: - Metastatic papillary thyroid carcinoma in 3 of 3 lymph nodes (3/3).  THYROID GLAND: Procedure: Thyroidectomy Tumor Focality: Unifocal Tumor Site: Left lobe Tumor Size: 0.8 cm Histologic Type: 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Margins: Carcinoma is broadly present at a black inked tissue edge. 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   PROCEDURE:  Total thyroidectomy and left modified radical neck dissection.   SURGEON:  Melida Quitter, MD     FINDINGS:  There was a firm nodule in zone IIB on the left side and also some firm tissue and adherence to the jugular vein in zone III region requiring removal of a portion of the vein.  There was a firm nodule in the left thyroid lobe that extended posterior to the gland, adhering to the trachea, and encompassing the recurrent laryngeal nerve.  Tissue was sent for frozen section from adjacent to the nerve, demonstrating papillary carcinoma.  The nerve stimulated distal to the mass but not proximal to the mass; thus, the nerve was removed with the mass.  The left-sided parathyroid glands were visualized, as was the right superior parathyroid gland.  The right recurrent nerve was kept safe and stimulated well at the end of the case.   05/30/2020 Imaging   Negative for recurrent mass or adenopathy in the neck   Left vocal cord paresis.   04/03/2021 Cancer Staging   Staging form: Thyroid -  Differentiated, AJCC 8th Edition - Pathologic stage from 04/03/2021: pT1a, pN1, cM0 - Signed by Heath Lark, MD on 04/03/2021 Lymph node metastasis: Present   04/11/2021 Imaging   No change from the prior study.  No recurrent mass or adenopathy   Postop changes left neck, stable   Chronic paresis left vocal cord unchanged.     PHYSICAL EXAMINATION: ECOG PERFORMANCE STATUS: 1 - Symptomatic but completely ambulatory  Vitals:   04/11/22 1030  BP: 96/62  Pulse: (!) 115  Resp: 18  SpO2: 99%   Filed Weights   04/11/22 1030  Weight: 221 lb 12.8 oz (100.6 kg)    GENERAL:alert, no distress and comfortable SKIN: skin color, texture, turgor are normal, no rashes or significant lesions EYES: normal, Conjunctiva are pink and non-injected, sclera clear OROPHARYNX:no  exudate, no erythema and lips, buccal mucosa, and tongue normal  NECK: Noted well-healed surgical scar LYMPH:  no palpable lymphadenopathy in the cervical, axillary or inguinal LUNGS: clear to auscultation and percussion with normal breathing effort HEART: tachycardia, no murmurs and no lower extremity edema ABDOMEN:abdomen soft, non-tender and normal bowel sounds Musculoskeletal:no cyanosis of digits and no clubbing  NEURO: alert & oriented x 3 with chronic hoarseness no focal motor/sensory deficits  LABORATORY DATA:  I have reviewed the data as listed    Component Value Date/Time   NA 142 04/11/2022 1000   K 4.3 04/11/2022 1000   CL 107 04/11/2022 1000   CO2 30 04/11/2022 1000   GLUCOSE 83 04/11/2022 1000   BUN 25 (H) 04/11/2022 1000   CREATININE 1.43 (H) 04/11/2022 1000   CALCIUM 9.3 04/11/2022 1000   PROT 6.0 (L) 04/11/2022 1000   PROT 6.0 09/03/2021 0749   ALBUMIN 4.0 04/11/2022 1000   ALBUMIN 4.3 09/03/2021 0749   AST 20 04/11/2022 1000   ALT 28 04/11/2022 1000   ALKPHOS 58 04/11/2022 1000   BILITOT 1.3 (H) 04/11/2022 1000   GFRNONAA 50 (L) 04/11/2022 1000   GFRAA >60 11/22/2019 1023    No results found for: "SPEP", "UPEP"  Lab Results  Component Value Date   WBC 6.9 04/11/2022   NEUTROABS 4.9 04/11/2022   HGB 14.4 04/11/2022   HCT 41.6 04/11/2022   MCV 91.8 04/11/2022   PLT 145 (L) 04/11/2022      Chemistry      Component Value Date/Time   NA 142 04/11/2022 1000   K 4.3 04/11/2022 1000   CL 107 04/11/2022 1000   CO2 30 04/11/2022 1000   BUN 25 (H) 04/11/2022 1000   CREATININE 1.43 (H) 04/11/2022 1000      Component Value Date/Time   CALCIUM 9.3 04/11/2022 1000   ALKPHOS 58 04/11/2022 1000   AST 20 04/11/2022 1000   ALT 28 04/11/2022 1000   BILITOT 1.3 (H) 04/11/2022 1000       RADIOGRAPHIC STUDIES: I have personally reviewed the radiological images as listed and agreed with the findings in the report. DG Chest Portable 1 View  Result Date:  04/10/2022 CLINICAL DATA:  Palpitations. EXAM: PORTABLE CHEST 1 VIEW COMPARISON:  05/27/2021. FINDINGS: Heart is enlarged and the mediastinal contour stable. There is atherosclerotic calcification of the aorta. Mild atelectasis or scarring is noted at the right lung base. A density is noted along the right lateral side wall, compatible with focal fat on CT. Loop recorder device is present over the left chest. No acute osseous abnormality. A stable wire is noted over the right chest. IMPRESSION: No active disease. Electronically Signed  By: Brett Fairy M.D.   On: 04/10/2022 00:27

## 2022-04-11 NOTE — Assessment & Plan Note (Signed)
He has clinical hypotension and tachycardia I suspect this is related to his medications We also discussed importance of fluid hydration I will defer to his cardiologist for management

## 2022-04-11 NOTE — Assessment & Plan Note (Signed)
He has persistent neck discomfort due to prior surgery He has no clinical signs of recurrence

## 2022-04-11 NOTE — Assessment & Plan Note (Signed)
He has no signs of lymphoma recurrence He is reassured He will continue surveillance CT imaging at [facility] once a year and I will monitor her for recurrence of lymphoma I will see him again in 6 months for further follow-up

## 2022-04-12 NOTE — Telephone Encounter (Signed)
Sean Stanley spoke with pt & wife. Pt will start Amiodarone today.

## 2022-04-12 NOTE — Telephone Encounter (Signed)
Left message to discuss

## 2022-04-15 ENCOUNTER — Ambulatory Visit: Payer: Medicare Other | Attending: Cardiology

## 2022-04-15 DIAGNOSIS — I480 Paroxysmal atrial fibrillation: Secondary | ICD-10-CM

## 2022-04-15 NOTE — Progress Notes (Signed)
[facility] Summary Report / Loop Recorder

## 2022-04-16 LAB — CUP PACEART REMOTE DEVICE CHECK
Date Time Interrogation Session: 20240119230542
Implantable Pulse Generator Implant Date: 20230105

## 2022-04-16 NOTE — Progress Notes (Signed)
Patient appears to have had a viral panel due to soft blood pressures and severe fatigue in the setting of the ongoing pandemic and high viral burden in the community.

## 2022-04-17 ENCOUNTER — Ambulatory Visit (HOSPITAL_COMMUNITY): Payer: Medicare Other | Admitting: Nurse Practitioner

## 2022-04-18 ENCOUNTER — Ambulatory Visit (HOSPITAL_COMMUNITY): Payer: Medicare Other | Admitting: Physician Assistant

## 2022-04-22 ENCOUNTER — Ambulatory Visit (HOSPITAL_COMMUNITY)
Admission: RE | Admit: 2022-04-22 | Discharge: 2022-04-22 | Disposition: A | Discharge status: Home / Self Care | Payer: Medicare Other | Source: Ambulatory Visit | Attending: Physician Assistant | Admitting: Physician Assistant

## 2022-04-22 ENCOUNTER — Encounter (HOSPITAL_COMMUNITY): Payer: Self-pay | Admitting: Physician Assistant

## 2022-04-22 VITALS — BP 114/82 | HR 103 | Ht 73.0 in | Wt 220.6 lb

## 2022-04-22 DIAGNOSIS — I4819 Other persistent atrial fibrillation: Secondary | ICD-10-CM

## 2022-04-22 DIAGNOSIS — I38 Endocarditis, valve unspecified: Secondary | ICD-10-CM | POA: Insufficient documentation

## 2022-04-22 DIAGNOSIS — I484 Atypical atrial flutter: Secondary | ICD-10-CM | POA: Diagnosis not present

## 2022-04-22 DIAGNOSIS — Z8616 Personal history of COVID-19: Secondary | ICD-10-CM | POA: Insufficient documentation

## 2022-04-22 DIAGNOSIS — I480 Paroxysmal atrial fibrillation: Secondary | ICD-10-CM

## 2022-04-22 DIAGNOSIS — Z7901 Long term (current) use of anticoagulants: Secondary | ICD-10-CM | POA: Diagnosis not present

## 2022-04-22 DIAGNOSIS — I428 Other cardiomyopathies: Secondary | ICD-10-CM | POA: Insufficient documentation

## 2022-04-22 DIAGNOSIS — D6869 Other thrombophilia: Secondary | ICD-10-CM

## 2022-04-22 DIAGNOSIS — Z79899 Other long term (current) drug therapy: Secondary | ICD-10-CM | POA: Insufficient documentation

## 2022-04-22 NOTE — Progress Notes (Signed)
Primary Care Physician: Kathalene Frames, MD Referring Physician: ER f/u  Prior Cardiologist: Dr. Edwin Dada ([facility]), recently transferred over to Dr. Johney Frame Primary EP: Dr Charissa Bash is a 82 y.o. male with a h/o  MV repair 2002,  multi abdominal aneurysms with prior bleeding,(followed at [facility]),  unable to take anticoagulation, h/o atrial flutter, that is in the afib clinic, for onset of palpitations 8/9. He was found to be in rapid atrial fib at 143 bpm. He could not be cardioverted as he is not on anticoagulation 2/2 to multiple abdominal  aneurysms. He converted as IV was being started.  He was watched for another 3 hours and then d/c home with f/u here.  He is on metoprolol daily.   He states h/o ablation in 2015 at [facility] and had Covid the end of July. This is the first arrhythmia he has dad since ablation. He is in SR today.   Follow up in the AF clinic 05/28/21. Patient called [facility] with elevated heart rates and was instructed to take an additional dose of metoprolol and present to the ED if his symptoms did not improved. Patient was seen at the ED 05/27/21 with atypical atrial flutter. Rate control with IV metoprolol was unsuccessful and he was discharged without any other intervention. He has symptoms of fatigue, SOB, and poor sleep.   Follow up in the AF clinic 04/10/22. Patient has been seen in the ED 1/12 and today for rapid atrial flutter. He was cardioverted both times. Unfortunately, he was back out of rhythm after he returned home. He is in atrial flutter today with symptoms of fatigue and SOB with exertion. There does not appear to be any specific triggers.   Follow up in the AF clinic 04/22/22. Patient reports that his heart rates have improved to 90s-100s and his symptoms have also improved since starting amiodarone. He remains in atrial flutter. No bleeding issues on anticoagulation.   Today, he denies symptoms of chest pain, orthopnea, PND, lower extremity edema,  dizziness, presyncope, syncope, or neurologic sequela. The patient is tolerating medications without difficulties and is otherwise without complaint today.   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   s/p coiling 12/16/12 - [facility]   Atrial flutter ([facility])    BPH (benign prostatic hyperplasia)    Cancer ([place])    melanoma on lower right back and left chest - surgically removed and cleared   Difficult intubation    Dysrhythmia    H/O agent Orange exposure    Headache    migraine- not current   High bilirubin    pt states it's genetic   History of blood transfusion    Hypercholesteremia    Hypercholesterolemia    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 Maxwell Caul told him he had improved  Paroxysmal atrial fibrillation ([place]) 01/02/2021   Pneumonia    Thoracic ascending aortic aneurysm ([facility])    4.5 cm 03/2018 CT. 4.4 cm on echo 10/2021 and on CTA 03/2021   Thyroid cancer [facility])    Past Surgical History:  Procedure Laterality Date   ABDOMINAL ANGIOGRAM  08/05/2012   aneurysm repair     CARDIOVERSION  02/12/2012   Procedure: CARDIOVERSION;  Surgeon: Pixie Casino, MD;  Location: [place];  Service: Cardiovascular;  Laterality: N/A;   CARDIOVERSION N/A 06/22/2013   Procedure: CARDIOVERSION;  Surgeon: [name], MD;  Location: [place];  Service: Cardiovascular;  Laterality: N/A;   CARDIOVERSION  N/A 06/19/2021   Procedure: CARDIOVERSION;  Surgeon: Werner Lean, MD;  Location: [place];  Service: Cardiovascular;  Laterality: N/A;   CATARACT EXTRACTION Bilateral 2018   with lens implant   CHOLECYSTECTOMY     COLONOSCOPY     ESOPHAGOGASTRODUODENOSCOPY     implantable loop recorder placement  03/29/2021   Medtronic Reveal Linq model LNQ [id number]) implantable loop recorder   IR IMAGING GUIDED PORT INSERTION  05/26/2018   IR REMOVAL TUN ACCESS W/ PORT W/O FL MOD SED  02/05/2021   LYMPH NODE BIOPSY Left 04/27/2018   Procedure: EXCISIONAL BIOPSY OF LEFT CERVICAL LYMPH NODE;  Surgeon: Melida Quitter, MD;  Location: [place];  Service: ENT;  Laterality: Left;   LYMPH NODE BIOPSY Left 11/23/2018   Procedure: LEFT CERVICAL LYMPH NODE OPEN BIOPSY;  Surgeon: Melida Quitter, MD;  Location: [place];  Service: ENT;  Laterality: Left;   MENISCUS REPAIR Right 2009   MITRAL VALVE REPAIR  2002   [facility]   NM MYOVIEW LTD  07/22/2006   no ischemia   RADICAL NECK DISSECTION Left 01/15/2019   Procedure: LEFT NECK DISSECTION;  Surgeon: Melida Quitter, MD;  Location: [place];  Service: ENT;  Laterality: Left;   RIGHT HEART CATH  06/19/2004   normal right heart dynamics. EF 50%   TEE WITHOUT CARDIOVERSION  02/12/2012   Procedure: TRANSESOPHAGEAL ECHOCARDIOGRAM (TEE);  Surgeon: Pixie Casino, MD;  Location: [facility] ENDOSCOPY;  Service: Cardiovascular;  Laterality: N/A;   TEE WITHOUT CARDIOVERSION N/A 06/22/2013   Procedure: TRANSESOPHAGEAL ECHOCARDIOGRAM (TEE);  Surgeon: [name], MD;  Location: [place];  Service: Cardiovascular;  Laterality: N/A;   THYROIDECTOMY N/A 01/15/2019   Procedure: TOTAL THYROIDECTOMY;  Surgeon: Melida Quitter, MD;  Location: [facility] OR;  Service: ENT;  Laterality: N/A;    Current Outpatient Medications  Medication Sig Dispense Refill   acetaminophen (TYLENOL) 500 MG tablet Take 1,000 mg by mouth daily as needed for moderate pain.     alfuzosin (UROXATRAL) 10 MG  24 hr tablet Take 10 mg by mouth at bedtime.      amiodarone (PACERONE) 200 MG tablet Take 1 tablet by mouth twice a day for 1 month then reduce to 1 tablet daily 60 tablet 0   apixaban (ELIQUIS) 5 MG TABS tablet Take 1 tablet (5 mg total) by mouth 2 (two) times daily. 60 tablet 3   buPROPion (WELLBUTRIN XL) 150 MG 24 hr tablet Take 1 tablet (150 mg total) by mouth daily. 90 tablet 3   Cholecalciferol (VITAMIN D) 2000 units tablet Take 2,000 Units by mouth at bedtime.      ezetimibe (ZETIA) 10 MG tablet Take 1 tablet (10 mg total) by mouth every morning. 90 tablet 2   finasteride (PROSCAR) 5 MG tablet Take 5 mg by mouth daily with supper.  influenza vaccine adjuvanted (FLUAD) 0.5 ML injection Inject into the muscle. 0.5 mL 0   levothyroxine (SYNTHROID) 175 MCG tablet Take 1 tablet (175 mcg total) by mouth daily.     lidocaine-prilocaine (EMLA) cream Apply to affected area once before sleep time- 30 g 3   metoprolol succinate (TOPROL-XL) 25 MG 24 hr tablet Take 12.5 mg by mouth daily.     Polyethyl Glycol-Propyl Glycol (SYSTANE HYDRATION PF OP) Place 1 drop into both eyes 3 (three) times daily.     rosuvastatin (CRESTOR) 5 MG tablet Take 1 tablet (5 mg total) by mouth every Monday, Wednesday, and Friday. 36 tablet 3   No current facility-administered medications for this encounter.    Allergies  Allergen Reactions   Levaquin [Levofloxacin In D5w]     tendonitis   Nickel Itching   Pravastatin     Muscle aches   Simvastatin     Chest pain   Allopurinol Rash   Ampicillin Rash    Did it involve swelling of the face/tongue/throat, SOB, or low BP? No Did it involve sudden or severe rash/hives, skin peeling, or any reaction on the inside of your mouth or nose? Yes Did you need to seek medical attention at a hospital or doctor's office? Yes When did it last happen?      50+ years If all above answers are "NO", may proceed with cephalosporin use.    Penicillin G Rash    Social History    Socioeconomic History   Marital status: Married    Spouse name: [name]   Number of children: 2   Years of education: Not on file   Highest education level: Not on file  Occupational History   Occupation: retired Hydrologist  Tobacco Use   Smoking status: Never   Smokeless tobacco: Never   Tobacco comments:    Never smoke 04/10/22  Vaping Use   Vaping Use: Never used  Substance and Sexual Activity   Alcohol use: Not Currently    Comment: social   Drug use: No   Sexual activity: Not on file  Other Topics Concern   Not on file  Social History Narrative   Lives in [place], Retired   Science writer Determinants of Health   Financial Resource Strain: Not on file  Food Insecurity: Not on file  Transportation Needs: Not on file  Physical Activity: Not on file  Stress: Not on file  Social Connections: Not on file  Intimate Partner Violence: Not on file    Family History  Problem Relation Age of Onset   Parkinson's disease Mother 75   Heart failure Father 60   Cancer Maternal Grandmother    Heart attack Maternal Grandfather    Heart attack Paternal Grandmother    Heart attack Paternal Grandfather     ROS- All systems are reviewed and negative except as per the HPI above  Physical Exam: Vitals:   04/22/22 1019  BP: 114/82  Pulse: (!) 103  Weight: 100.1 kg  Height: '6\' 1"'$  (1.854 m)    Wt Readings from Last 3 Encounters:  04/22/22 100.1 kg  04/11/22 100.6 kg  04/10/22 101.1 kg    Labs: Lab Results  Component Value Date   NA 142 04/11/2022   K 4.3 04/11/2022   CL 107 04/11/2022   CO2 30 04/11/2022   GLUCOSE 83 04/11/2022   BUN 25 (H) 04/11/2022   CREATININE 1.43 (H) 04/11/2022   CALCIUM 9.3 04/11/2022   PHOS 3.4 08/11/2012   MG 2.2 04/05/2022  Lab Results  Component Value Date   INR 1.1 04/10/2022   Lab Results  Component Value Date   CHOL 156 09/03/2021   HDL 61 09/03/2021   LDLCALC 78 09/03/2021   TRIG 92 09/03/2021    GEN- The patient is a well  appearing elderly male, alert and oriented x 3 today.   HEENT-head normocephalic, atraumatic, sclera clear, conjunctiva pink, hearing intact, trachea midline. Lungs- Clear to ausculation bilaterally, normal work of breathing Heart- Regular rate and rhythm, no murmurs, rubs or gallops  GI- soft, NT, ND, + BS Extremities- no clubbing, cyanosis, or edema MS- no significant deformity or atrophy Skin- no rash or lesion Psych- euthymic mood, full affect Neuro- strength and sensation are intact   EKG-  Atrial flutter with 2:1 block Vent. rate 103 BPM PR interval * ms QRS duration 118 ms QT/QTcB 372/487 ms  Epic records reviewed   Echo 11/08/21  1. Left ventricular ejection fraction, by estimation, is 50 to 55%. The left ventricle has normal function. The left ventricle has no regional wall motion abnormalities. Left ventricular diastolic function could not be evaluated.   2. Right ventricular systolic function is normal. The right ventricular size is normal. There is normal pulmonary artery systolic pressure.   3. Left atrial size was severely dilated.   4. Right atrial size was severely dilated.   5. The mitral valve has been repaired/replaced. No evidence of mitral valve regurgitation. No evidence of mitral stenosis. Procedure Date: 2002.   6. The aortic valve is tricuspid. Aortic valve regurgitation is not  visualized. Aortic valve sclerosis is present, with no evidence of aortic valve stenosis.   7. There is moderate dilatation of the aortic root, measuring 40 mm. There is moderate dilatation of the ascending aorta, measuring 44 mm.   8. The inferior vena cava is normal in size with greater than 50%  respiratory variability, suggesting right atrial pressure of 3 mmHg.    CHA2DS2-VASc Score = 4  The patient's score is based upon: CHF History: 1 HTN History: 0 Diabetes History: 0 Stroke History: 0 Vascular Disease History: 1 Age Score: 2 Gender Score: 0       ASSESSMENT AND  PLAN: 1. Persistent Atrial Fibrillation/atypical atrial flutter The patient's CHA2DS2-VASc score is 4, indicating a 4.8% annual risk of stroke.   Patient s/p DCCV on 04/05/22 and 04/10/22 with quick return of his atrial flutter.  Will arrange for DCCV following amiodarone load.  Continue amiodarone 200 mg BID. Decrease to 200 mg daily after DCCV. He does have severe biatrial enlargement, unclear if he would be a candidate for ablation, he would like to discuss with Dr Curt Bears.  Continue Toprol 12.5 mg daily  2. Secondary Hypercoagulable State (ICD10:  D68.69) The patient is at significant risk for stroke/thromboembolism based upon his CHA2DS2-VASc Score of 4.  Continue Apixaban (Eliquis).   3. Valvular heart disease Prior MV repair Followed by Dr Johney Frame   4. NICM EF normalized Appears euvolemic today.   Follow up with Dr Curt Bears post DCCV to discuss the above.    [facility] [address] [place],  [address] [phone]

## 2022-04-22 NOTE — Patient Instructions (Addendum)
Day of cardioversion reduce amiodarone to '200mg'$  ONCE a day  Cardioversion scheduled for: Thursday, February 22nd   - Arrive at the [facility] and go to admitting at [place] not eat or drink anything after midnight the night prior to your procedure.   - Take all your morning medication (except diabetic medications) with a sip of water prior to arrival.  - You will not be able to drive home after your procedure.    - Do NOT miss any doses of your blood thinner - if you should miss a dose please notify our office immediately.   - If you feel as if you go back into normal rhythm prior to scheduled cardioversion, please notify our office immediately.  If your procedure is canceled in the cardioversion suite you will be charged a cancellation fee.

## 2022-04-23 ENCOUNTER — Other Ambulatory Visit (HOSPITAL_COMMUNITY): Payer: Self-pay | Admitting: *Deleted

## 2022-04-23 MED ORDER — AMIODARONE HCL 200 MG PO TABS: ORAL_TABLET | ORAL | 0 refills | Status: DC

## 2022-04-26 DIAGNOSIS — M25561 Pain in right knee: Secondary | ICD-10-CM | POA: Diagnosis not present

## 2022-04-30 ENCOUNTER — Ambulatory Visit (HOSPITAL_COMMUNITY)
Admission: RE | Admit: 2022-04-30 | Discharge: 2022-04-30 | Disposition: A | Discharge status: Home / Self Care | Payer: Medicare Other | Source: Ambulatory Visit | Attending: Physician Assistant | Admitting: Physician Assistant

## 2022-04-30 DIAGNOSIS — I4892 Unspecified atrial flutter: Secondary | ICD-10-CM | POA: Insufficient documentation

## 2022-04-30 DIAGNOSIS — I4819 Other persistent atrial fibrillation: Secondary | ICD-10-CM | POA: Insufficient documentation

## 2022-04-30 DIAGNOSIS — I4510 Unspecified right bundle-branch block: Secondary | ICD-10-CM | POA: Diagnosis not present

## 2022-04-30 NOTE — Patient Instructions (Signed)
Saturday Feb 10th - decrease amiodarone to '200mg'$  once a day

## 2022-04-30 NOTE — Progress Notes (Signed)
Patient returns for ECG after loading on amiodarone. He called this AM feeling that he went back into SR last evening. ECG shows:  SB, 1st degree AV block Vent. rate 58 BPM PR interval 212 ms QRS duration 118 ms QT/QTcB 470/461 ms  Will cancel DCCV. He will stay on amiodarone 200 mg BID until 05/03/22 then decrease to 200 mg daily. He will f/u with Dr Curt Bears as scheduled.

## 2022-05-02 ENCOUNTER — Encounter (HOSPITAL_COMMUNITY): Payer: Self-pay | Admitting: *Deleted

## 2022-05-03 ENCOUNTER — Other Ambulatory Visit (HOSPITAL_COMMUNITY): Payer: Self-pay | Admitting: Physician Assistant

## 2022-05-06 ENCOUNTER — Telehealth: Payer: Self-pay | Admitting: Cardiology

## 2022-05-06 DIAGNOSIS — Z9889 Other specified postprocedural states: Secondary | ICD-10-CM

## 2022-05-06 DIAGNOSIS — I484 Atypical atrial flutter: Secondary | ICD-10-CM

## 2022-05-06 DIAGNOSIS — I428 Other cardiomyopathies: Secondary | ICD-10-CM

## 2022-05-06 DIAGNOSIS — I5022 Chronic systolic (congestive) heart failure: Secondary | ICD-10-CM

## 2022-05-06 NOTE — Telephone Encounter (Signed)
Patient's wife states she would like a referral for cardiac rehab. She says she spoke with the afib clinic, but they said the referral would have to come from  Dr. Johney Frame.

## 2022-05-06 NOTE — Telephone Encounter (Signed)
RE: refer to cardiac rehab per Dr. Johney Frame Received: Today Sean Heap, LPN Darcella Cheshire ! So Cardiac Rehab has to review his chart and THEY will call him to schedule. Deals with insurance and his condition.

## 2022-05-06 NOTE — Addendum Note (Signed)
Addended by: Nuala Alpha on: 05/06/2022 05:25 PM   Modules accepted: Orders

## 2022-05-06 NOTE — Telephone Encounter (Signed)
Pt is requesting a referral to cardiac rehab, as he inquired this request from the afib clinic, but they advised for him to reach out to you for this request.   Please advise if he can be referred to cardiac rehab or not.  Thanks!

## 2022-05-06 NOTE — Telephone Encounter (Signed)
Mandell, Leeb" - 05/06/2022  9:03 AM Freada Bergeron, MD  Sent: Mon May 06, 2022  9:54 AM  To: Nuala Alpha, LPN         Message  Happy to refer him. He may not qualify but we can always try!    Referral to cardiac rehab placed in the system.  Will send [facility] Scheduling team a message to help coordinate this referral with cardiac rehab, in getting the pt in with them.  Per Dr. Johney Frame, refer to cardiac rehab for Chronic systolic heart failure, MVR, aflutter.    Referral is in the system.  [facility] scheduling dept and cardiac rehab to follow-up with the pt about this, if rehab is approved for the pt.   Pt and wife both aware that we placed the referral to cardiac rehab in the system, and our scheduling team will get this to the rehab dept, to further follow-up with them about approved sessions or not.  Both parties verbalized understanding and agrees with this plan.

## 2022-05-06 NOTE — Telephone Encounter (Signed)
Rowe Pavy, RN  Freada Bergeron, MD; Winifred Olive M, LPN [place],  Pt does not have a qualifying diagnosis for cardiac rehab.  His EF is too high for Medicare plans to cover and his Valve surgery was in 2002.  He could however do pulmonary rehab 2 days a week, non telemetry. If you agree, place ambulatory referral to pulmonary rehab with the diagnosis of Chronic systolic heart failure.  Your thoughts? Cherre Huger, BSN Cardiac and Pulmonary Rehab Nurse Navigator   Freada Bergeron, MD  Rowe Pavy, RN; Nuala Alpha, LPN Love it. We thought it would be a shot in the dark but I think he would be happy with Pulmonary rehab!    Called the pt and wife back to inform them that the pt does not qualify for cardiac rehab (for reasons mentioned above), but he does qualify for pulmonary rehab with 2 days a week for sessions, non-telemetry.  Pt education provided about this.  Both parties state that they are interested in the pt starting pulmonary rehab for chronic systolic heart failure.  Both parties are aware that I will go ahead and place this referral in the system and send a message back to the pulmonary rehab nurse navigator about this, so that she can reach out to them and arrange these sessions for the pt.   Both the pt and wife verbalized understanding and agrees with this plan.

## 2022-05-06 NOTE — Addendum Note (Signed)
Addended by: Nuala Alpha on: 05/06/2022 02:16 PM   Modules accepted: Orders

## 2022-05-08 ENCOUNTER — Telehealth (HOSPITAL_COMMUNITY): Payer: Self-pay

## 2022-05-08 ENCOUNTER — Encounter (HOSPITAL_COMMUNITY): Payer: Self-pay

## 2022-05-08 NOTE — Telephone Encounter (Signed)
Pt wife [name] returned PR phone call and stated pt is interested in PR.

## 2022-05-10 ENCOUNTER — Telehealth (HOSPITAL_COMMUNITY): Payer: Self-pay | Admitting: *Deleted

## 2022-05-10 NOTE — Telephone Encounter (Signed)
Patient called in stating he noticed this morning his HR is irregular in the 50-60s and feels he is back in AF. Discussed with Adline Peals PA will increase amiodarone to 258m twice a day for 1 week then call with update. Pt verbalized agreement.

## 2022-05-14 ENCOUNTER — Encounter (HOSPITAL_COMMUNITY): Payer: Medicare Other | Admitting: Physician Assistant

## 2022-05-16 ENCOUNTER — Encounter (HOSPITAL_COMMUNITY): Payer: Self-pay

## 2022-05-16 ENCOUNTER — Ambulatory Visit (HOSPITAL_COMMUNITY): Admit: 2022-05-16 | Payer: Medicare Other | Admitting: Cardiovascular Disease

## 2022-05-16 SURGERY — CARDIOVERSION
Anesthesia: Monitor Anesthesia Care

## 2022-05-17 LAB — CUP PACEART REMOTE DEVICE CHECK
Date Time Interrogation Session: 20240221230939
Implantable Pulse Generator Implant Date: 20230105

## 2022-05-17 NOTE — Telephone Encounter (Signed)
Pt back in NSR. Feeling well. Per Adline Peals PA will reduce amiodarone to '200mg'$  once a day . Follow up with Dr. Curt Bears as scheduled. Pt in agreement.

## 2022-05-20 ENCOUNTER — Ambulatory Visit: Payer: Medicare Other

## 2022-05-20 ENCOUNTER — Encounter (INDEPENDENT_AMBULATORY_CARE_PROVIDER_SITE_OTHER): Payer: Medicare Other | Admitting: Ophthalmology

## 2022-05-20 DIAGNOSIS — H33303 Unspecified retinal break, bilateral: Secondary | ICD-10-CM

## 2022-05-20 DIAGNOSIS — I4819 Other persistent atrial fibrillation: Secondary | ICD-10-CM

## 2022-05-20 DIAGNOSIS — H43813 Vitreous degeneration, bilateral: Secondary | ICD-10-CM

## 2022-05-20 DIAGNOSIS — I5022 Chronic systolic (congestive) heart failure: Secondary | ICD-10-CM

## 2022-05-22 ENCOUNTER — Encounter: Payer: Self-pay | Admitting: Neurology

## 2022-05-22 ENCOUNTER — Ambulatory Visit (INDEPENDENT_AMBULATORY_CARE_PROVIDER_SITE_OTHER): Payer: Medicare Other | Admitting: Neurology

## 2022-05-22 VITALS — BP 110/60 | HR 65 | Ht 73.0 in | Wt 218.0 lb

## 2022-05-22 DIAGNOSIS — I480 Paroxysmal atrial fibrillation: Secondary | ICD-10-CM | POA: Diagnosis not present

## 2022-05-22 DIAGNOSIS — Z77098 Contact with and (suspected) exposure to other hazardous, chiefly nonmedicinal, chemicals: Secondary | ICD-10-CM

## 2022-05-22 DIAGNOSIS — G6289 Other specified polyneuropathies: Secondary | ICD-10-CM

## 2022-05-22 DIAGNOSIS — I714 Abdominal aortic aneurysm, without rupture, unspecified: Secondary | ICD-10-CM | POA: Diagnosis not present

## 2022-05-22 DIAGNOSIS — C8318 Mantle cell lymphoma, lymph nodes of multiple sites: Secondary | ICD-10-CM

## 2022-05-22 DIAGNOSIS — G2581 Restless legs syndrome: Secondary | ICD-10-CM

## 2022-05-22 NOTE — Progress Notes (Signed)
Provider:  Larey Seat, MD  Primary Care Physician:  Kathalene Frames, MD [address], [address] [place] [address]     Referring Provider: [name], Md [address] [place],  [place] [address]          Chief Complaint according to patient   Patient presents with:     New Patient (Initial Visit)           HISTORY OF PRESENT ILLNESS:   Chief concern according to patient :  here after hospitalization, known OSA patient.    Sean Stanley is a 82 y.o. male patient who is here for revisit 05/22/2022 for  follow up from recent atrial fibrillation, hospitalization. Is followed by Dr Curt Bears and now in rhythm again, on Amiodarone. I reviewed several remote reports of his device- he had one 4 hours episode of atrial fib on 03-13-2022. On eloquis and no recent epistaxis.    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   Patient with progressive  Agent orange induced neuropathy, progression is mostly felt I his feet. At night he is bothered the most. Has tried topical and tylenol, not much relief.  I suggested a compression sock to see if that gives relief.   He has hand tingling now as well, indicating progression. Amiodarone has a potential to contribute to this. He has dreamt more vividly, nightmarish.       .   RV 05-17-2021: Sean Stanley, now 82 years old,  is seen here for his RV, scheduled , non urgent-  Atrial fibrillation, single episode  recorded in ED, loop recorder now.   Last HST did not show apnea, 11-2020.  He was told about the watchman procedure. He is here to discuss benefit and risks of anticoagulation in his history of aneurysm. We reviewed his echocardiogram and there was no note of any clot, he has mild dilation of the aorta. I think he is safe to anti-coagulate. He is  leaning towards no anticoagulation.  MOCA testing today was stable, 28/ 30.     I have the pleasure of seeing Sean Stanley again on 05-17-2020-  A 82 year old  Caucasian gentleman, who had a another rough year.  His neuropathy is progression, from the toes ascending above the ankle, pain keeps him awake at night. 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  July 2020 completion of his chemotherapy and now considered in remission.  In July 2021 he was still in remission, had a port- a -cath removed. [place], [place]- treatable , not cureable.    He takes Tylenol and CBD oil skin treatment. Tried Voltaren - he is only in pain at rest and not while up and about. Left side worse then the right. Creepy crawly sensation- RLS ? He massages it to get relief.     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  EMG evaluation of the right lower extremity shows chronic stable distal signs of neuropathic denervation consistent with the diagnosis of peripheral neuropathy.  There is no evidence of an overlying lumbosacral radiculopathy. [name] 09/02/2017 4:06 PM           He is retired from the [employer] as of September 1988 he was diagnosed with a peripheral neuropathy at the in the lower extremities bilateral in October 2002 he had a mitral valve repair, in August 2011 arthroscopic meniscus shaving, in November 2013 atrial flutter cardioversion in May 2014 pressure peritoneal abdominal bleed, coiling of an aneurysm in  the abdomen September 2014, November 2014 atrial flutter again with cardioversion, January 2015 cholecystectomy May 2015 and another atrial flutter at this time ablation procedure.  Cataract surgeries in 2018 . In February 2020 left neck node lymph node excision for biopsy diagnosed with mantle cell B non-Hodgkin's lymphoma treated here locally at [facility] lung [facility] center has a Port-A-Cath has been on rituximab a drug we are very familiar with the neurology as well.  July 2020 completion of his chemotherapy and now considered in remission.   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  However he is speaking portable and in this quiet room I can hear him very well.  In May of this year he had to take bite and was treated preventively with doxycycline and in October 2021 he is planned to have an enlarged prostate procedure.  He also had [facility] exam for high-frequency hearing loss.  Medication list is mildly unchanged.  He applies EMLA to the affected area, he has to supplement thyroid hormone now with Synthroid 175 mcg daily he has dry eyes until he takes polyethylene glycol appropriately.  He is on Wellbutrin and this works out well.  Acetaminophen as needed Zetia at bedtime, Proscar at bedtime.   Has difficulties sleeping. 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 He is reluctant to speak as people lean in to hear him, and he  Worries about Covid.  Mother had parkinson's disease.        Video visit 08-2018 History of Present Illness: patient with neuropathy and recently diagnosed Lymphoma, having success with neuropathy control by walking regularly, only needing occasionally tylenol.  Observations/Objective: reports improvement in sleep duration and quality. Patient presents well groomed, in no acute  distress.  Sean Stanley is a 82 year old Caucasian right-handed gentleman with an underlying medical history of non-Hodgkin lymphoma, neuropathy, he was evaluated for sleep apnea which was mild and strictly positional without associated hypoxemia and there were no PLM's.  Yet his neuropathy has kept him from sleeping well and he has reported very fragmented sleep.  Dr. 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  This is a neuropathy form that can be seen with agent orange exposure, which the patient had during the [place] War.  This neuropathy has ascended over the last 25 years slowly but steadily.       Review of Systems: Out of a complete 14 system review, the patient complains of only the following symptoms, and all other reviewed systems are negative.:  Fatigue, sleepiness , pain, insomnia-     How likely are you to  doze in the following situations: 0 = not likely, 1 = slight chance, 2 = moderate chance, 3 = high chance   Sitting and Reading? Watching Television? Sitting inactive in a public place (theater or meeting)? As a passenger in a car for an hour without a break? Lying down in the afternoon when circumstances permit? Sitting and talking to someone? Sitting quietly after lunch without alcohol? In a car, while stopped for a few minutes in traffic?   Total = / 24 points   FSS endorsed at XX/ 63 points.   GDs 4/ 15   Social History   Socioeconomic History   Marital status: Married    Spouse name: [name]   Number of children: 2   Years of education: Not on file   Highest education level: Not on file  Occupational History   Occupation: retired Hydrologist  Tobacco Use   Smoking status: Never   Smokeless tobacco: Never   Tobacco comments:    Never smoke 04/10/22  Vaping Use   Vaping Use: Never used  Substance and Sexual Activity   Alcohol use: Not Currently    Comment: social   Drug use: No    Sexual activity: Not on file  Other Topics Concern   Not on file  Social History Narrative   Lives in [place], Retired   Science writer Determinants of Radio broadcast assistant Strain: Not on file  Food Insecurity: Not on file  Transportation Needs: Not on file  Physical Activity: Not on file  Stress: Not on file  Social Connections: Not on file    Family History  Problem Relation Age of Onset   Parkinson's disease Mother 58   Heart failure Father 21   Cancer Maternal Grandmother    Heart attack Maternal Grandfather    Heart attack Paternal Grandmother    Heart attack Paternal Grandfather     Past Medical History:  Diagnosis Date   Abdominal aortic aneurysm, 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, IR could not access culprit lesion and treated with anticoag reversal; no AAA noted on 06/13/17 CTA   Aneurysm artery, 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[place])    followed at [facility]   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 [facility])    being followed at [facility]   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   s/p coiling 12/16/12 - [facility]   Atrial flutter ([facility])    BPH (benign prostatic hyperplasia)    Cancer ([place])    melanoma on lower right back and left chest - surgically removed and cleared   Difficult intubation    Dysrhythmia    H/O agent Orange exposure    Headache    migraine- not current   High bilirubin    pt states it's genetic   History of blood transfusion    Hypercholesteremia    Hypercholesterolemia    Lymphoma ([place]) 04/2018   Mitral valve disease    annuloplasty 2002 [facility]   Neuropathy    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 Maxwell Caul told him he had improved   Paroxysmal atrial fibrillation ([place]) 01/02/2021   Pneumonia    Thoracic ascending aortic aneurysm ([place])    4.5 cm 03/2018 CT. 4.4 cm on echo 10/2021 and on CTA 03/2021   Thyroid  cancer [facility])     Past Surgical History:  Procedure Laterality Date   ABDOMINAL ANGIOGRAM  08/05/2012   aneurysm repair     CARDIOVERSION  02/12/2012   Procedure: CARDIOVERSION;  Surgeon: Pixie Casino, MD;  Location: [place];  Service: Cardiovascular;  Laterality: N/A;   CARDIOVERSION N/A 06/22/2013   Procedure: CARDIOVERSION;  Surgeon: [name], MD;  Location: [place];  Service: Cardiovascular;  Laterality: N/A;   CARDIOVERSION N/A 06/19/2021   Procedure: CARDIOVERSION;  Surgeon: Werner Lean, MD;  Location: [place];  Service: Cardiovascular;  Laterality: N/A;   CATARACT EXTRACTION Bilateral 2018   with lens implant   CHOLECYSTECTOMY     COLONOSCOPY     ESOPHAGOGASTRODUODENOSCOPY     implantable loop recorder placement  03/29/2021   Medtronic Reveal Linq model LNQ 22 [id number]) implantable loop recorder   IR IMAGING GUIDED PORT INSERTION  05/26/2018   IR REMOVAL TUN ACCESS W/ PORT W/O FL MOD SED  02/05/2021   LYMPH NODE BIOPSY Left 04/27/2018   Procedure: EXCISIONAL BIOPSY OF LEFT CERVICAL LYMPH NODE;  Surgeon: Melida Quitter, MD;  Location: [place];  Service: ENT;  Laterality: Left;   LYMPH NODE BIOPSY Left 11/23/2018   Procedure: LEFT CERVICAL LYMPH NODE OPEN BIOPSY;  Surgeon: Melida Quitter, MD;  Location: [place];  Service: ENT;  Laterality: Left;   MENISCUS REPAIR Right 2009   MITRAL VALVE REPAIR  2002   [facility]   NM MYOVIEW LTD  07/22/2006   no ischemia   RADICAL NECK DISSECTION Left 01/15/2019   Procedure: LEFT NECK DISSECTION;  Surgeon: Melida Quitter, MD;  Location: [place];  Service: ENT;  Laterality: Left;   RIGHT HEART CATH  06/19/2004   normal right heart dynamics. EF 50%   TEE WITHOUT CARDIOVERSION  02/12/2012   Procedure: TRANSESOPHAGEAL ECHOCARDIOGRAM (TEE);  Surgeon: Pixie Casino, MD;  [facility] ENDOSCOPY;  Service: Cardiovascular;  Laterality: N/A;   TEE WITHOUT CARDIOVERSION N/A 06/22/2013   Procedure: TRANSESOPHAGEAL  ECHOCARDIOGRAM (TEE);  Surgeon: [name], MD;  Location: [place];  Service: Cardiovascular;  Laterality: N/A;   THYROIDECTOMY N/A 01/15/2019   Procedure: TOTAL THYROIDECTOMY;  Surgeon: Melida Quitter, MD;  Location: [place];  Service: ENT;  Laterality: N/A;     Current Outpatient Medications on File Prior to Visit  Medication Sig Dispense Refill   acetaminophen (TYLENOL) 500 MG tablet Take 1,000 mg by mouth daily as needed for moderate pain.     alfuzosin (UROXATRAL) 10 MG 24 hr tablet Take 10 mg by mouth at bedtime.      amiodarone (PACERONE) 200 MG tablet Take 1 tablet (200 mg total) by mouth daily. 90 tablet 1   apixaban (ELIQUIS) 5 MG TABS tablet Take 1 tablet (5 mg total) by mouth 2 (two) times daily. 60 tablet 3   buPROPion (WELLBUTRIN XL) 150 MG 24 hr tablet Take 1 tablet (150 mg total) by mouth daily. 90 tablet 3   Cholecalciferol (VITAMIN D) 2000 units tablet Take 2,000 Units by mouth at bedtime.      ezetimibe (ZETIA) 10 MG tablet Take 1 tablet (10 mg total) by mouth every morning. 90 tablet 2   finasteride (PROSCAR) 5 MG tablet Take 5 mg by mouth daily with supper.     influenza vaccine adjuvanted (FLUAD) 0.5 ML injection Inject into the muscle. 0.5 mL 0   levothyroxine (SYNTHROID) 175 MCG tablet Take 1 tablet (175 mcg total) by mouth daily.     lidocaine-prilocaine (EMLA) cream Apply to affected area once before sleep time- 30 g  3   metoprolol succinate (TOPROL-XL) 25 MG 24 hr tablet Take 12.5 mg by mouth daily.     Polyethyl Glycol-Propyl Glycol (SYSTANE HYDRATION PF OP) Place 1 drop into both eyes 3 (three) times daily.     rosuvastatin (CRESTOR) 5 MG tablet Take 1 tablet (5 mg total) by mouth every Monday, Wednesday, and Friday. 36 tablet 3   No current facility-administered medications on file prior to visit.    Allergies  Allergen Reactions   Levaquin [Levofloxacin In D5w]     tendonitis   Nickel Itching   Pravastatin     Muscle aches   Simvastatin      Chest pain   Allopurinol Rash   Ampicillin Rash    Did it involve swelling of the face/tongue/throat, SOB, or low BP? No Did it involve sudden or severe rash/hives, skin peeling, or any reaction on the inside of your mouth or nose? Yes Did you need to seek medical attention at a hospital or doctor's office? Yes When did it last happen?      50+ years If all above answers are "NO", may proceed with cephalosporin use.    Penicillin G Rash     DIAGNOSTIC DATA (LABS, IMAGING, TESTING) - I reviewed patient records, labs, notes, testing and imaging myself where available.  Lab Results  Component Value Date   WBC 6.9 04/11/2022   HGB 14.4 04/11/2022   HCT 41.6 04/11/2022   MCV 91.8 04/11/2022   PLT 145 (L) 04/11/2022      Component Value Date/Time   NA 142 04/11/2022 1000   K 4.3 04/11/2022 1000   CL 107 04/11/2022 1000   CO2 30 04/11/2022 1000   GLUCOSE 83 04/11/2022 1000   BUN 25 (H) 04/11/2022 1000   CREATININE 1.43 (H) 04/11/2022 1000   CALCIUM 9.3 04/11/2022 1000   PROT 6.0 (L) 04/11/2022 1000   PROT 6.0 09/03/2021 0749   ALBUMIN 4.0 04/11/2022 1000   ALBUMIN 4.3 09/03/2021 0749   AST 20 04/11/2022 1000   ALT 28 04/11/2022 1000   ALKPHOS 58 04/11/2022 1000   BILITOT 1.3 (H) 04/11/2022 1000   GFRNONAA 50 (L) 04/11/2022 1000   GFRAA >60 11/22/2019 1023   Lab Results  Component Value Date   CHOL 156 09/03/2021   HDL 61 09/03/2021   LDLCALC 78 09/03/2021   TRIG 92 09/03/2021   CHOLHDL 2.6 09/03/2021   No results found for: "HGBA1C" Lab Results  Component Value Date   [id number] 05/24/2019   Lab Results  Component Value Date   TSH 0.673 03/23/2022    PHYSICAL EXAM:  Today's Vitals   05/22/22 1028  BP: 110/60  Pulse: 65  Weight: 218 lb (98.9 kg)  Height: '6\' 1"'$  (1.854 m)   Body mass index is 28.76 kg/m.   Wt Readings from Last 3 Encounters:  05/22/22 218 lb (98.9 kg)  04/22/22 220 lb 9.6 oz (100.1 kg)  04/11/22 221 lb 12.8 oz (100.6 kg)      Ht Readings from Last 3 Encounters:  05/22/22 '6\' 1"'$  (1.854 m)  04/22/22 '6\' 1"'$  (1.854 m)  04/11/22 '6\' 1"'$  (1.854 m)      General: The patient is awake, alert and appears not in acute distress. The patient is well groomed. Head: Normocephalic, atraumatic. Neck is supple. neck circumference:17.5 inches . Nasal airflow  patent.  Retrognathia is not seen.  Dental status:  Cardiovascular:  Regular rate and cardiac rhythm by pulse,  without distended neck veins. Respiratory: Lungs  are clear to auscultation.  Skin:  Without evidence of ankle edema, or rash. Trunk: The patient's posture is erect.   NEUROLOGIC EXAM: The patient is awake and alert, oriented to place and time.   Memory subjective described as impaired;     05/22/2022   10:31 AM 05/22/2021   10:52 AM 11/15/2020   10:27 AM 04/30/2018   10:30 AM  Montreal Cognitive Assessment   Visuospatial/ Executive (0/5) '4 5 4 5  '$ Naming (0/3) '3 3 3 3  '$ Attention: Read list of digits (0/2) '2 2 2 2  '$ Attention: Read list of letters (0/1) '1 1 1 1  '$ Attention: Serial 7 subtraction starting at 100 (0/3) '3 3 3 3  '$ Language: Repeat phrase (0/2) '2 2 2 2  '$ Language : Fluency (0/1) 0 1 0 1  Abstraction (0/2) '2 2 2 2  '$ Delayed Recall (0/5) '4 3 3 '$ 0  Orientation (0/6) '6 6 6 6  '$ Total '27 28 26 25    '$ Attention span & concentration ability appears normal.  Speech is fluent, with prominent  dysphonia  Mood and affect are depressed,    Cranial nerves: no loss of smell or taste reported  Pupils are equal and briskly reactive to light. Funduscopic exam deferred..  Extraocular movements in vertical and horizontal planes were intact and without nystagmus. No Diplopia. Visual fields by finger perimetry are intact. Hearing was intact to soft voice and finger rubbing.    Facial sensation intact to fine touch.  Facial motor strength is symmetric and tongue and uvula move midline.  Neck ROM : rotation, tilt and flexion extension were normal for age and shoulder  shrug was symmetrical.    Neuropathy reaching mid-calf and hands.     ASSESSMENT AND PLAN 82 y.o. year old male  here with:    1) subjective memory changes not verified by MOCA. -good score.   2)non restorative sleep due to pain in the setting of sensory neuropathy. Try compression stockings.   3) amiodarone can contribute to worsening neuropathy, myopathy.    Compression stockings.   I plan to follow up either personally or through our NP within 6 months.   I would like to thank Kathalene Frames, MD and [name], Md [address] [place],  [place] [address] for allowing me to meet with and to take care of this pleasant patient.    After spending a total time of  30  minutes face to face and additional time for physical and neurologic examination, review of laboratory studies,  personal review of imaging studies, reports and results of other testing and review of referral information / records as far as provided in visit,   Electronically signed by: Larey Seat, MD 05/22/2022 10:49 AM  [facility] Sleep Board certified by The [employer] of Sleep Medicine and Diplomate of the [employer] of Sleep Medicine. Board certified In Neurology through the [place], Fellow of the [employer] of Neurology. Medical Director of [employer].

## 2022-05-27 DIAGNOSIS — H43813 Vitreous degeneration, bilateral: Secondary | ICD-10-CM | POA: Diagnosis not present

## 2022-05-27 DIAGNOSIS — H04123 Dry eye syndrome of bilateral lacrimal glands: Secondary | ICD-10-CM | POA: Diagnosis not present

## 2022-05-27 DIAGNOSIS — H26491 Other secondary cataract, right eye: Secondary | ICD-10-CM | POA: Diagnosis not present

## 2022-05-27 DIAGNOSIS — H40013 Open angle with borderline findings, low risk, bilateral: Secondary | ICD-10-CM | POA: Diagnosis not present

## 2022-05-29 ENCOUNTER — Encounter (HOSPITAL_COMMUNITY): Payer: Self-pay

## 2022-05-29 ENCOUNTER — Ambulatory Visit: Payer: Medicare Other | Attending: Cardiology | Admitting: Cardiology

## 2022-05-29 ENCOUNTER — Encounter: Payer: Self-pay | Admitting: Cardiology

## 2022-05-29 VITALS — BP 120/74 | HR 62 | Ht 73.0 in | Wt 223.0 lb

## 2022-05-29 DIAGNOSIS — D6869 Other thrombophilia: Secondary | ICD-10-CM | POA: Diagnosis not present

## 2022-05-29 DIAGNOSIS — Z79899 Other long term (current) drug therapy: Secondary | ICD-10-CM | POA: Insufficient documentation

## 2022-05-29 DIAGNOSIS — I4819 Other persistent atrial fibrillation: Secondary | ICD-10-CM | POA: Insufficient documentation

## 2022-05-29 NOTE — Progress Notes (Signed)
Electrophysiology Office Note   Date:  05/29/2022   ID:  Sean Stanley, DOB 02/11/1941, MRN [id number]  PCP:  Kathalene Frames, Sean Stanley  Cardiologist:  Sean Stanley Primary Electrophysiologist: Sean Aviella Disbrow Meredith Leeds, Sean Stanley    Chief Complaint: atrial flutter   History of Present Illness: Sean Stanley is a 82 y.o. male who is being seen today for the evaluation of atrial flutter at the request of Kathalene Frames, *. Presenting today for electrophysiology evaluation.  He has a history significant for ruptured aortic aneurysm in 2014, renal artery aneurysm, atrial flutter, hyperlipidemia, sleep apnea, thoracic aortic aneurysm, atrial fibrillation, minimally send mitral valve repair in 2002.  He had atrial flutter ablation in 2015.  He had 1 episode of atrial fibrillation around the time of COVID infection.  He had ILR implanted for atrial fibrillation monitoring.  Over the last few months, he has had more frequent episodes of atrial fibrillation.  He has been started on amiodarone.  Since starting amiodarone, he has remained in sinus rhythm.  He would prefer to not take amiodarone long-term.  Today, denies symptoms of palpitations, chest pain, shortness of breath, orthopnea, PND, lower extremity edema, claudication, dizziness, presyncope, syncope, bleeding, or neurologic sequela. The patient is tolerating medications without difficulties.     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   s/p coiling 12/16/12 - [facility]   Atrial flutter ([facility])    BPH (benign prostatic hyperplasia)     Cancer ([place])    melanoma on lower right back and left chest - surgically removed and cleared   Difficult intubation    Dysrhythmia    H/O agent Orange exposure    Headache    migraine- not current   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 Maxwell Caul told him he had improved   Paroxysmal atrial fibrillation ([place]) 01/02/2021   Pneumonia    Thoracic ascending aortic aneurysm ([facility])    4.5 cm 03/2018 CT. 4.4 cm on echo 10/2021 and on CTA 03/2021   Thyroid cancer [facility])    Past Surgical History:  Procedure Laterality Date   ABDOMINAL ANGIOGRAM  08/05/2012   aneurysm repair     CARDIOVERSION  02/12/2012   Procedure: CARDIOVERSION;  Surgeon: Pixie Casino, Sean Stanley;  Location: [facility] ENDOSCOPY;  Service: Cardiovascular;  Laterality: N/A;   CARDIOVERSION N/A 06/22/2013   Procedure: CARDIOVERSION;  Surgeon: [name], Sean Stanley;  Location: [facility] ENDOSCOPY;  Service: Cardiovascular;  Laterality: N/A;   CARDIOVERSION N/A 06/19/2021   Procedure: CARDIOVERSION;  Surgeon: Werner Lean, Sean Stanley;  Location: [place] ENDOSCOPY;  Service: Cardiovascular;  Laterality: N/A;   CATARACT EXTRACTION Bilateral 2018   with lens implant  CHOLECYSTECTOMY     COLONOSCOPY     ESOPHAGOGASTRODUODENOSCOPY     implantable loop recorder placement  03/29/2021   Medtronic Reveal Linq model LNQ [id number]) implantable loop recorder   IR IMAGING GUIDED PORT INSERTION  05/26/2018   IR REMOVAL TUN ACCESS W/ PORT W/O FL MOD SED  02/05/2021   LYMPH NODE BIOPSY Left 04/27/2018   Procedure: EXCISIONAL BIOPSY OF LEFT CERVICAL LYMPH NODE;  Surgeon: Melida Quitter, Sean Stanley;  Location: [place];  Service: ENT;  Laterality: Left;   LYMPH NODE BIOPSY  Left 11/23/2018   Procedure: LEFT CERVICAL LYMPH NODE OPEN BIOPSY;  Surgeon: Melida Quitter, Sean Stanley;  Location: [place];  Service: ENT;  Laterality: Left;   MENISCUS REPAIR Right 2009   MITRAL VALVE REPAIR  2002   [facility]   NM MYOVIEW LTD  07/22/2006   no ischemia   RADICAL NECK DISSECTION Left 01/15/2019   Procedure: LEFT NECK DISSECTION;  Surgeon: Melida Quitter, Sean Stanley;  Location: [place];  Service: ENT;  Laterality: Left;   RIGHT HEART CATH  06/19/2004   normal right heart dynamics. EF 50%   TEE WITHOUT CARDIOVERSION  02/12/2012   Procedure: TRANSESOPHAGEAL ECHOCARDIOGRAM (TEE);  Surgeon: Pixie Casino, Sean Stanley;  Location: [facility] ENDOSCOPY;  Service: Cardiovascular;  Laterality: N/A;   TEE WITHOUT CARDIOVERSION N/A 06/22/2013   Procedure: TRANSESOPHAGEAL ECHOCARDIOGRAM (TEE);  Surgeon: [name], Sean Stanley;  Location: [place];  Service: Cardiovascular;  Laterality: N/A;   THYROIDECTOMY N/A 01/15/2019   Procedure: TOTAL THYROIDECTOMY;  Surgeon: Melida Quitter, Sean Stanley;  [facility] OR;  Service: ENT;  Laterality: N/A;     Current Outpatient Medications  Medication Sig Dispense Refill   acetaminophen (TYLENOL) 500 MG tablet Take 1,000 mg by mouth daily as needed for moderate pain.     alfuzosin (UROXATRAL) 10 MG 24 hr tablet Take 10 mg by mouth at bedtime.      amiodarone (PACERONE) 200 MG tablet Take 1 tablet (200 mg total) by mouth daily. 90 tablet 1   apixaban (ELIQUIS) 5 MG TABS tablet Take 1 tablet (5 mg total) by mouth 2 (two) times daily. 60 tablet 3   buPROPion (WELLBUTRIN XL) 150 MG 24 hr tablet Take 1 tablet (150 mg total) by mouth daily. 90 tablet 3   Cholecalciferol (VITAMIN D) 2000 units tablet Take 2,000 Units by mouth at bedtime.      ezetimibe (ZETIA) 10 MG tablet Take 1 tablet (10 mg total) by mouth every morning. 90 tablet 2   finasteride (PROSCAR) 5 MG tablet Take 5 mg by mouth daily with supper.     influenza vaccine adjuvanted (FLUAD) 0.5 ML injection Inject into the muscle. 0.5 mL 0    levothyroxine (SYNTHROID) 175 MCG tablet Take 1 tablet (175 mcg total) by mouth daily.     lidocaine-prilocaine (EMLA) cream Apply to affected area once before sleep time- 30 g 3   metoprolol succinate (TOPROL-XL) 25 MG 24 hr tablet Take 12.5 mg by mouth daily.     Polyethyl Glycol-Propyl Glycol (SYSTANE HYDRATION PF OP) Place 1 drop into both eyes 3 (three) times daily.     rosuvastatin (CRESTOR) 5 MG tablet Take 1 tablet (5 mg total) by mouth every Monday, Wednesday, and Friday. 36 tablet 3   No current facility-administered medications for this visit.    Allergies:   Levaquin [levofloxacin in d5w], Nickel, Pravastatin, Simvastatin, Allopurinol, Ampicillin, and Penicillin g   Social History:  The patient  reports that he has never smoked. He has never  used smokeless tobacco. He reports that he does not currently use alcohol. He reports that he does not use drugs.   Family History:  The patient's family history includes Cancer in his maternal grandmother; Heart attack in his maternal grandfather, paternal grandfather, and paternal grandmother; Heart failure (age of onset: 5) in his father; Parkinson's disease (age of onset: 50) in his mother.   ROS:  Please see the history of present illness.   Otherwise, review of systems is positive for none.   All other systems are reviewed and negative.   PHYSICAL EXAM: VS:  BP 120/74   Pulse 62   Ht '6\' 1"'$  (1.854 m)   Wt 223 lb (101.2 kg)   SpO2 96%   BMI 29.42 kg/m  , BMI Body mass index is 29.42 kg/m. GEN: Well nourished, well developed, in no acute distress  HEENT: normal  Neck: no JVD, carotid bruits, or masses Cardiac: RRR; no murmurs, rubs, or gallops,no edema  Respiratory:  clear to auscultation bilaterally, normal work of breathing GI: soft, nontender, nondistended, + BS MS: no deformity or atrophy  Skin: warm and dry, device site well healed Neuro:  Strength and sensation are intact Psych: euthymic mood, full affect  EKG:  EKG  is ordered today. Personal review of the ekg ordered shows sinus rhythm  Personal review of the device interrogation today. Results in [place]: 03/23/2022: TSH 0.673 04/05/2022: Magnesium 2.2 04/10/2022: B Natriuretic Peptide 120.5 04/11/2022: ALT 28; BUN 25; Creatinine 1.43; Hemoglobin 14.4; Platelet Count 145; Potassium 4.3; Sodium 142    Lipid Panel     Component Value Date/Time   CHOL 156 09/03/2021 0749   TRIG 92 09/03/2021 0749   HDL 61 09/03/2021 0749   CHOLHDL 2.6 09/03/2021 0749   CHOLHDL 3.2 02/22/2019 0757   VLDL 23 02/22/2019 0757   LDLCALC 78 09/03/2021 0749     Wt Readings from Last 3 Encounters:  05/29/22 223 lb (101.2 kg)  05/22/22 218 lb (98.9 kg)  04/22/22 220 lb 9.6 oz (100.1 kg)      Other studies Reviewed: Additional studies/ records that were reviewed today include: TTE 11/08/21 Review of the above records today demonstrates:   1. Left ventricular ejection fraction, by estimation, is 50 to 55%. The  left ventricle has normal function. The left ventricle has no regional  wall motion abnormalities. Left ventricular diastolic function could not  be evaluated.   2. Right ventricular systolic function is normal. The right ventricular  size is normal. There is normal pulmonary artery systolic pressure.   3. Left atrial size was severely dilated.   4. Right atrial size was severely dilated.   5. The mitral valve has been repaired/replaced. No evidence of mitral  valve regurgitation. No evidence of mitral stenosis. Procedure Date: 2002.   6. The aortic valve is tricuspid. Aortic valve regurgitation is not  visualized. Aortic valve sclerosis is present, with no evidence of aortic  valve stenosis.   7. There is moderate dilatation of the aortic root, measuring 40 mm.  There is moderate dilatation of the ascending aorta, measuring 44 mm.   8. The inferior vena cava is normal in size with greater than 50%  respiratory variability, suggesting  right atrial pressure of 3 mmHg.    ASSESSMENT AND PLAN:  1.  Paroxysmal atrial fibrillation/atypical atrial flutter: Currently on Eliquis 5 mg twice daily.  CHA2DS2-VASc of 4.  He has gone back into atrial fibrillation and is now on amiodarone.  He would prefer to not take amiodarone long-term.  Due to that, we Sean Stanley plan for ablation.  Risk and benefits have been discussed.  He understands the risks and is agreed to the procedure.  Risk, benefits, and alternatives to EP study and radiofrequency ablation for afib were also discussed in detail today. These risks include but are not limited to stroke, bleeding, vascular damage, tamponade, perforation, damage to the esophagus, lungs, and other structures, pulmonary vein stenosis, worsening renal function, and death. The patient understands these risk and wishes to proceed.  We Ellwood Steidle therefore proceed with catheter ablation at the next available time.  Carto, ICE, anesthesia are requested for the procedure.  Georgie Eduardo also obtain CT PV protocol prior to the procedure to exclude LAA thrombus and further evaluate atrial anatomy.  2.  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  Stable on most recent echo.  Plan per primary cardiology.  3.  Abdominal aortic aneurysm: Post coiling.  Has follow-up with vascular surgery.  4.  Secondary hypercoagulable state: Currently on Eliquis for atrial fibrillation  5.  High risk medication monitoring: Currently on amiodarone.  Labs within normal limits.    Current medicines are reviewed at length with the patient today.   The patient does not have concerns regarding his medicines.  The following changes were made today: none  Labs/ tests ordered today include:  Orders Placed This Encounter  Procedures   EKG 12-Lead     Disposition:   FU 3 months  Signed, Whitney Hillegass Meredith Leeds, Sean Stanley  05/29/2022 3:06 PM     [address] [place]   [phone] (office) [phone] (fax)

## 2022-05-29 NOTE — Patient Instructions (Signed)
Medication Instructions:  Your physician recommends that you continue on your current medications as directed. Please refer to the Current Medication list given to you today.  *If you need a refill on your cardiac medications before your next appointment, please call your pharmacy*   Lab Work: Pre procedure labs -- see procedure instruction letter:  BMP & CBC  If you have labs (blood work) drawn today and your tests are completely normal, you will receive your results only by: [place] (if you have MyChart) OR A paper copy in the mail If you have any lab test that is abnormal or we need to change your treatment, we will call you to review the results.   Testing/Procedures: Your physician has requested that you have cardiac CT within 7 days PRIOR to your ablation. Cardiac computed tomography (CT) is a painless test that uses an x-ray machine to take clear, detailed pictures of your heart.  Please follow instruction below located under "other instructions". You will get a call from our office to schedule the date for this test.  Your physician has recommended that you have an ablation. Catheter ablation is a medical procedure used to treat some cardiac arrhythmias (irregular heartbeats). During catheter ablation, a long, thin, flexible tube is put into a blood vessel in your groin (upper thigh), or neck. This tube is called an ablation catheter. It is then guided to your heart through the blood vessel. Radio frequency waves destroy small areas of heart tissue where abnormal heartbeats may cause an arrhythmia to start.   EP procedure scheduler will call you to schedule this procedure for some time this summer.  It may be several weeks before hearing from her.   Follow-Up: At [facility], you and your health needs are our priority.  As part of our continuing mission to provide you with exceptional heart care, we have created designated Provider Care Teams.  These Care Teams include your  primary Cardiologist (physician) and Advanced Practice Providers (APPs -  Physician Assistants and Nurse Practitioners) who all work together to provide you with the care you need, when you need it.  Your next appointment:   1 month(s) after your ablation  The format for your next appointment:   In Person  Provider:   AFib clinic   Thank you for choosing [facility]!!   Trinidad Curet, RN [phone]    Other Instructions   Cardiac Ablation Cardiac ablation is a procedure to destroy (ablate) some heart tissue that is sending bad signals. These bad signals cause problems in heart rhythm. The heart has many areas that make these signals. If there are problems in these areas, they can make the heart beat in a way that is not normal. Destroying some tissues can help make the heart rhythm normal. Tell your doctor about: Any allergies you have. All medicines you are taking. These include vitamins, herbs, eye drops, creams, and over-the-counter medicines. Any problems you or family members have had with medicines that make you fall asleep (anesthetics). Any blood disorders you have. Any surgeries you have had. Any medical conditions you have, such as kidney failure. Whether you are pregnant or may be pregnant. What are the risks? This is a safe procedure. But problems may occur, including: Infection. Bruising and bleeding. Bleeding into the chest. Stroke or blood clots. Damage to nearby areas of your body. Allergies to medicines or dyes. The need for a pacemaker if the normal system is damaged. Failure of the procedure to treat  the problem. What happens before the procedure? Medicines Ask your doctor about: Changing or stopping your normal medicines. This is important. Taking aspirin and ibuprofen. Do not take these medicines unless your doctor tells you to take them. Taking other medicines, vitamins, herbs, and supplements. General instructions Follow instructions  from your doctor about what you cannot eat or drink. Plan to have someone take you home from the hospital or clinic. If you will be going home right after the procedure, plan to have someone with you for 24 hours. Ask your doctor what steps will be taken to prevent infection. What happens during the procedure?  An IV tube will be put into one of your veins. You will be given a medicine to help you relax. The skin on your neck or groin will be numbed. A cut (incision) will be made in your neck or groin. A needle will be put through your cut and into a large vein. A tube (catheter) will be put into the needle. The tube will be moved to your heart. Dye may be put through the tube. This helps your doctor see your heart. Small devices (electrodes) on the tube will send out signals. A type of energy will be used to destroy some heart tissue. The tube will be taken out. Pressure will be held on your cut. This helps stop bleeding. A bandage will be put over your cut. The exact procedure may vary among doctors and hospitals. What happens after the procedure? You will be watched until you leave the hospital or clinic. This includes checking your heart rate, breathing rate, oxygen, and blood pressure. Your cut will be watched for bleeding. You will need to lie still for a few hours. Do not drive for 24 hours or as long as your doctor tells you. Summary Cardiac ablation is a procedure to destroy some heart tissue. This is done to treat heart rhythm problems. Tell your doctor about any medical conditions you may have. Tell him or her about all medicines you are taking to treat them. This is a safe procedure. But problems may occur. These include infection, bruising, bleeding, and damage to nearby areas of your body. Follow what your doctor tells you about food and drink. You may also be told to change or stop some of your medicines. After the procedure, do not drive for 24 hours or as long as your  doctor tells you. This information is not intended to replace advice given to you by your health care provider. Make sure you discuss any questions you have with your health care provider. Document Revised: 06/01/2021 Document Reviewed: 02/11/2019 Elsevier Patient Education  [place].

## 2022-05-30 ENCOUNTER — Telehealth (HOSPITAL_COMMUNITY): Payer: Self-pay

## 2022-05-30 DIAGNOSIS — I714 Abdominal aortic aneurysm, without rupture, unspecified: Secondary | ICD-10-CM | POA: Diagnosis not present

## 2022-05-30 DIAGNOSIS — G609 Hereditary and idiopathic neuropathy, unspecified: Secondary | ICD-10-CM | POA: Diagnosis not present

## 2022-05-30 DIAGNOSIS — I4892 Unspecified atrial flutter: Secondary | ICD-10-CM | POA: Diagnosis not present

## 2022-05-30 DIAGNOSIS — E890 Postprocedural hypothyroidism: Secondary | ICD-10-CM | POA: Diagnosis not present

## 2022-05-30 DIAGNOSIS — C73 Malignant neoplasm of thyroid gland: Secondary | ICD-10-CM | POA: Diagnosis not present

## 2022-05-30 DIAGNOSIS — G3184 Mild cognitive impairment, so stated: Secondary | ICD-10-CM | POA: Diagnosis not present

## 2022-05-30 DIAGNOSIS — N1832 Chronic kidney disease, stage 3b: Secondary | ICD-10-CM | POA: Diagnosis not present

## 2022-05-30 DIAGNOSIS — C8310 Mantle cell lymphoma, unspecified site: Secondary | ICD-10-CM | POA: Diagnosis not present

## 2022-05-30 DIAGNOSIS — E785 Hyperlipidemia, unspecified: Secondary | ICD-10-CM | POA: Diagnosis not present

## 2022-05-30 DIAGNOSIS — I429 Cardiomyopathy, unspecified: Secondary | ICD-10-CM | POA: Diagnosis not present

## 2022-05-30 DIAGNOSIS — I2510 Atherosclerotic heart disease of native coronary artery without angina pectoris: Secondary | ICD-10-CM | POA: Diagnosis not present

## 2022-05-30 DIAGNOSIS — Z952 Presence of prosthetic heart valve: Secondary | ICD-10-CM | POA: Diagnosis not present

## 2022-05-30 NOTE — Progress Notes (Signed)
[facility] Summary Report / Loop Recorder 

## 2022-05-30 NOTE — Telephone Encounter (Signed)
Pt called to confirm pulmonary rehab appointment for 3/8. No answer. Voicemail left.

## 2022-05-31 ENCOUNTER — Encounter (HOSPITAL_COMMUNITY): Payer: Self-pay

## 2022-05-31 ENCOUNTER — Encounter (HOSPITAL_COMMUNITY)
Admission: RE | Admit: 2022-05-31 | Discharge: 2022-05-31 | Disposition: A | Discharge status: Home / Self Care | Payer: Medicare Other | Source: Ambulatory Visit | Attending: Cardiology | Admitting: Cardiology

## 2022-05-31 ENCOUNTER — Telehealth: Payer: Self-pay

## 2022-05-31 VITALS — BP 102/60 | HR 63 | Wt 220.5 lb

## 2022-05-31 DIAGNOSIS — Z5189 Encounter for other specified aftercare: Secondary | ICD-10-CM | POA: Diagnosis not present

## 2022-05-31 DIAGNOSIS — I4819 Other persistent atrial fibrillation: Secondary | ICD-10-CM

## 2022-05-31 DIAGNOSIS — I5022 Chronic systolic (congestive) heart failure: Secondary | ICD-10-CM | POA: Insufficient documentation

## 2022-05-31 NOTE — Telephone Encounter (Signed)
Pt is scheduled for an Afib Ablation with Dr. Curt Bears on 6/27 @ 10:30. He will have labs done on 6/14.  He would like his letters mailed to him.

## 2022-05-31 NOTE — Progress Notes (Signed)
Sean Stanley 82 y.o. male Pulmonary Rehab Orientation Note This patient who was referred to Pulmonary Rehab by Dr. Johney Frame with the diagnosis of Chronic systolic heart failure arrived today in Cardiac and Pulmonary Rehab. He arrived ambulatory with normal gait. He does not carry portable oxygen. Per patient, [name] uses oxygen never. Color good, skin warm and dry. Patient is oriented to time and place. Patient's medical history, psychosocial health, and medications reviewed. Psychosocial assessment reveals patient lives with spouse. Sean Stanley is currently retired. Patient hobbies include spending time with others and doing water aerobics . Patient reports his stress level is moderate. Areas of stress/anxiety include health and family . Patient does not exhibit signs of depression. PHQ2/9 score 0/0. Nussen shows good  coping skills with positive outlook on life. Offered emotional support and reassurance. Will continue to monitor. Physical assessment performed by Janine Ores RN. Please see their orientation physical assessment note. Sean Stanley reports he  does take medications as prescribed. Patient states he  follows a regular  diet. The patient has been trying to lose weight through a healthy diet and exercise program.. Patient's weight will be monitored closely. Demonstration and practice of PLB using pulse oximeter. [name] able to return demonstration satisfactorily. Safety and hand hygiene in the exercise area reviewed with patient. [name] voices understanding of the information reviewed. Department expectations discussed with patient and achievable goals were set. The patient shows enthusiasm about attending the program and we look forward to working with Regino Schultze. Sean Stanley completed a 6 min walk test today and is scheduled to begin exercise on 06/06/22 at 1:15 pm.   [id number] Sheppard Plumber, MS, ACSM-CEP

## 2022-05-31 NOTE — Progress Notes (Signed)
Pulmonary Rehab Orientation Physical Assessment Note  Physical assessment reveals patient is alert and oriented x 4.  Heart rate is normal, breath sounds clear to auscultation, no wheezes, rales, or rhonchi. Pt denies chronic cough, voiced noted hoarse from thyroidectomy. Bowel sounds present x4 quads.  Pt denies abdominal discomfort, nausea, vomiting or diarrhea. Grip strength equal, strong. Distal pulses +2; no swelling to lower extremities.   Janine Ores, RN, BSN

## 2022-05-31 NOTE — Progress Notes (Signed)
Pulmonary Individual Treatment Plan  Patient Details  Name: Sean Stanley MRN: [id number] Date of Birth: [birth year] Referring Provider:   April Manson Pulmonary Rehab Walk Test from 05/31/2022 in [facility] & [place]  Referring Provider [place]       Initial Encounter Date:  Flowsheet Row Pulmonary Rehab Walk Test from 05/31/2022 in [facility] for Heart, Vascular, & [place]  Date 05/31/22       Visit Diagnosis: Heart failure, chronic systolic ([place])  Patient's Home Medications on Admission:   Current Outpatient Medications:    acetaminophen (TYLENOL) 500 MG tablet, Take 1,000 mg by mouth daily as needed for moderate pain., Disp: , Rfl:    alfuzosin (UROXATRAL) 10 MG 24 hr tablet, Take 10 mg by mouth at bedtime. , Disp: , Rfl:    amiodarone (PACERONE) 200 MG tablet, Take 1 tablet (200 mg total) by mouth daily., Disp: 90 tablet, Rfl: 1   apixaban (ELIQUIS) 5 MG TABS tablet, Take 1 tablet (5 mg total) by mouth 2 (two) times daily., Disp: 60 tablet, Rfl: 3   buPROPion (WELLBUTRIN XL) 150 MG 24 hr tablet, Take 1 tablet (150 mg total) by mouth daily., Disp: 90 tablet, Rfl: 3   Cholecalciferol (VITAMIN D) 2000 units tablet, Take 2,000 Units by mouth at bedtime. , Disp: , Rfl:    ezetimibe (ZETIA) 10 MG tablet, Take 1 tablet (10 mg total) by mouth every morning., Disp: 90 tablet, Rfl: 2   finasteride (PROSCAR) 5 MG tablet, Take 5 mg by mouth daily with supper., Disp: , Rfl:    influenza vaccine adjuvanted (FLUAD) 0.5 ML injection, Inject into the muscle., Disp: 0.5 mL, Rfl: 0   levothyroxine (SYNTHROID) 175 MCG tablet, Take 1 tablet (175 mcg total) by mouth daily., Disp: , Rfl:    lidocaine-prilocaine (EMLA) cream, Apply to affected area once before sleep time-, Disp: 30 g, Rfl: 3   metoprolol succinate (TOPROL-XL) 25 MG 24 hr tablet, Take 12.5 mg by mouth daily., Disp: , Rfl:    Polyethyl Glycol-Propyl Glycol  (SYSTANE HYDRATION PF OP), Place 1 drop into both eyes 3 (three) times daily., Disp: , Rfl:    rosuvastatin (CRESTOR) 5 MG tablet, Take 1 tablet (5 mg total) by mouth every Monday, Wednesday, and Friday., Disp: 36 tablet, Rfl: 3  Past Medical History: Past Medical History:  Diagnosis Date   Abdominal aortic aneurysm, 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, IR could not access culprit lesion and treated with anticoag reversal; no AAA noted on 06/13/17 CTA   Aneurysm artery, 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[place])    followed at [facility]   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 [facility])    being followed at [facility]   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   s/p coiling 12/16/12 - [facility]   Atrial flutter ([facility])    BPH (benign prostatic hyperplasia)    Cancer ([place])    melanoma on lower right back and left chest - surgically removed and cleared   Difficult intubation    Dysrhythmia    H/O agent Orange exposure    Headache    migraine- not current   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 Maxwell Caul told him he had improved   Paroxysmal atrial fibrillation ([place]) 01/02/2021   Pneumonia    Thoracic ascending aortic aneurysm (HCC)    4.5 cm 03/2018 CT. 4.4 cm on echo 10/2021 and on CTA 03/2021   Thyroid cancer ([place])     Tobacco Use: Social History   Tobacco Use  Smoking Status Never  Smokeless Tobacco Never  Tobacco Comments   Never smoke 04/10/22    Labs: Review Flowsheet  More data exists      Latest Ref Rng & Units 02/22/2019 10/26/2020 02/13/2021 06/19/2021 09/03/2021  Labs for ITP Cardiac and Pulmonary Rehab  Cholestrol 100 - 199 mg/dL 185  184   210  - 156   LDL (calc) 0 - 99 mg/dL 104  112  127  - 78   HDL-C >39 mg/dL 58  52  65  - 61   Trlycerides 0 - 149 mg/dL 115  111  100  - 92   TCO2 22 - 32 mmol/L - - - 26  -    Capillary Blood Glucose: Lab Results  Component Value Date   GLUCAP 88 05/24/2019   GLUCAP 95 11/09/2018   GLUCAP 88 08/24/2018     Pulmonary Assessment Scores:  Pulmonary Assessment Scores     Row Name 05/31/22 0932         ADL UCSD   ADL Phase Entry     SOB Score total 7       CAT Score   CAT Score 0       mMRC Score   mMRC Score 2             UCSD: Self-administered rating of dyspnea associated with activities of daily living (ADLs) 6-point scale (0 = "not at all" to 5 = "maximal or unable to do because of breathlessness")  Scoring Scores range from 0 to 120.  Minimally important difference is 5 units  CAT: CAT can identify the health impairment of COPD patients and is better correlated with disease progression.  CAT has a scoring range of zero to 40. The CAT score is classified into four groups of low (less than 10), medium (10 - 20), high (21-30) and very high (31-40) based on the impact level of disease on health status. A CAT score over 10 suggests significant symptoms.  A worsening CAT score could be explained by an exacerbation, poor medication adherence, poor inhaler technique, or progression of COPD or comorbid conditions.  CAT MCID is 2 points  mMRC: mMRC (Modified Medical Research Council) Dyspnea Scale is used to assess the degree of baseline functional disability in patients of respiratory disease due to dyspnea. No minimal important difference is established. A decrease in score of 1 point or greater is considered a positive change.   Pulmonary Function Assessment:  Pulmonary Function Assessment - 05/31/22 0931       Breath   Shortness of Breath Panic with Shortness of Breath;Yes             Exercise Target Goals: Exercise Program Goal: Individual exercise  prescription set using results from initial 6 min walk test and THRR while considering  patient's activity barriers and safety.   Exercise Prescription Goal: Initial exercise prescription builds to 30-45 minutes a day of aerobic activity, 2-3 days per week.  Home exercise guidelines  will be given to patient during program as part of exercise prescription that the participant will acknowledge.  Activity Barriers & Risk Stratification:  Activity Barriers & Cardiac Risk Stratification - 05/31/22 0933       Activity Barriers & Cardiac Risk Stratification   Activity Barriers Deconditioning;Muscular Weakness;Shortness of Breath;Arthritis    Comments neuropathy in feet and meniscus knee surgery             6 Minute Walk:  6 Minute Walk     Row Name 05/31/22 1036         6 Minute Walk   Phase Initial     Distance 1200 feet     Walk Time 6 minutes     # of Rest Breaks 0     MPH 2.27     METS 1.79     RPE 10     Perceived Dyspnea  0     VO2 Peak 6.25     Symptoms No     Resting HR 63 bpm     Resting BP 102/60     Resting Oxygen Saturation  96 %     Exercise Oxygen Saturation  during 6 min walk 95 %     Max Ex. HR 75 bpm     Max Ex. BP 128/66     2 Minute Post BP 120/66       Interval HR   1 Minute HR 67     2 Minute HR 71     3 Minute HR 71     4 Minute HR 75     5 Minute HR 74     6 Minute HR 74     2 Minute Post HR 74     Interval Heart Rate? Yes       Interval Oxygen   Interval Oxygen? Yes     Baseline Oxygen Saturation % 96 %     1 Minute Oxygen Saturation % 95 %     1 Minute Liters of Oxygen 0 L     2 Minute Oxygen Saturation % 95 %     2 Minute Liters of Oxygen 0 L     3 Minute Oxygen Saturation % 95 %     3 Minute Liters of Oxygen 0 L     4 Minute Oxygen Saturation % 95 %     4 Minute Liters of Oxygen 0 L     5 Minute Oxygen Saturation % 95 %     5 Minute Liters of Oxygen 0 L     6 Minute Oxygen Saturation % 95 %     6 Minute Liters of Oxygen 0 L      2 Minute Post Oxygen Saturation % 96 %     2 Minute Post Liters of Oxygen 0 L              Oxygen Initial Assessment:  Oxygen Initial Assessment - 05/31/22 0930       Home Oxygen   Home Oxygen Device None    Sleep Oxygen Prescription None    Home Exercise Oxygen Prescription None    Home Resting Oxygen Prescription None      Initial 6 min Walk   Oxygen Used None      Program Oxygen Prescription   Program Oxygen Prescription None      Intervention   Short Term Goals To learn and understand importance of maintaining oxygen saturations>88%;To learn and demonstrate proper use of respiratory  medications;To learn and understand importance of monitoring SPO2 with pulse oximeter and demonstrate accurate use of the pulse oximeter.;To learn and demonstrate proper pursed lip breathing techniques or other breathing techniques.     Long  Term Goals Verbalizes importance of monitoring SPO2 with pulse oximeter and return demonstration;Maintenance of O2 saturations>88%;Exhibits proper breathing techniques, such as pursed lip breathing or other method taught during program session             Oxygen Re-Evaluation:   Oxygen Discharge (Final Oxygen Re-Evaluation):   Initial Exercise Prescription:  Initial Exercise Prescription - 05/31/22 1000       Date of Initial Exercise RX and Referring Provider   Date 05/31/22    Referring Provider Pemberton    Expected Discharge Date 08/27/22      Arm Ergometer   Level 1    Watts 5    RPM 20    Minutes 15      Elliptical   Level 1    Speed 1    Minutes 15      Prescription Details   Frequency (times per week) 2    Duration Progress to 30 minutes of continuous aerobic without signs/symptoms of physical distress      Intensity   THRR 40-80% of Max Heartrate 56-111    Ratings of Perceived Exertion 11-13    Perceived Dyspnea 0-4      Progression   Progression Continue progressive overload as per policy without signs/symptoms or  physical distress.      Resistance Training   Training Prescription Yes    Weight blue bands    Reps 10-15             Perform Capillary Blood Glucose checks as needed.  Exercise Prescription Changes:   Exercise Comments:   Exercise Goals and Review:   Exercise Goals     Row Name 05/31/22 0934             Exercise Goals   Increase Physical Activity Yes       Intervention Provide advice, education, support and counseling about physical activity/exercise needs.;Develop an individualized exercise prescription for aerobic and resistive training based on initial evaluation findings, risk stratification, comorbidities and participant's personal goals.       Expected Outcomes Short Term: Attend rehab on a regular basis to increase amount of physical activity.;Long Term: Add in home exercise to make exercise part of routine and to increase amount of physical activity.;Long Term: Exercising regularly at least 3-5 days a week.       Increase Strength and Stamina Yes       Intervention Provide advice, education, support and counseling about physical activity/exercise needs.;Develop an individualized exercise prescription for aerobic and resistive training based on initial evaluation findings, risk stratification, comorbidities and participant's personal goals.       Expected Outcomes Short Term: Increase workloads from initial exercise prescription for resistance, speed, and METs.;Short Term: Perform resistance training exercises routinely during rehab and add in resistance training at home;Long Term: Improve cardiorespiratory fitness, muscular endurance and strength as measured by increased METs and functional capacity (6MWT)       Able to understand and use rate of perceived exertion (RPE) scale Yes       Intervention Provide education and explanation on how to use RPE scale       Expected Outcomes Short Term: Able to use RPE daily in rehab to express subjective intensity level;Long  Term:  Able to use RPE to guide  intensity level when exercising independently       Knowledge and understanding of Target Heart Rate Range (THRR) Yes       Intervention Provide education and explanation of THRR including how the numbers were predicted and where they are located for reference       Expected Outcomes Short Term: Able to state/look up THRR;Long Term: Able to use THRR to govern intensity when exercising independently;Short Term: Able to use daily as guideline for intensity in rehab       Understanding of Exercise Prescription Yes       Intervention Provide education, explanation, and written materials on patient's individual exercise prescription       Expected Outcomes Short Term: Able to explain program exercise prescription;Long Term: Able to explain home exercise prescription to exercise independently                Exercise Goals Re-Evaluation :   Discharge Exercise Prescription (Final Exercise Prescription Changes):   Nutrition:  Target Goals: Understanding of nutrition guidelines, daily intake of sodium '1500mg'$ , cholesterol '200mg'$ , calories 30% from fat and 7% or less from saturated fats, daily to have 5 or more servings of fruits and vegetables.  Biometrics:  Pre Biometrics - 05/31/22 0916       Pre Biometrics   Grip Strength 48 kg              Nutrition Therapy Plan and Nutrition Goals:   Nutrition Assessments:  MEDIFICTS Score Key: ?70 Need to make dietary changes  40-70 Heart Healthy Diet ? 40 Therapeutic Level Cholesterol Diet   Picture Your Plate Scores: [id number] Unhealthy dietary pattern with much room for improvement. 41-50 Dietary pattern unlikely to meet recommendations for good health and room for improvement. 51-60 More healthful dietary pattern, with some room for improvement.  >60 Healthy dietary pattern, although there may be some specific behaviors that could be improved.    Nutrition Goals Re-Evaluation:   Nutrition Goals  Discharge (Final Nutrition Goals Re-Evaluation):   Psychosocial: Target Goals: Acknowledge presence or absence of significant depression and/or stress, maximize coping skills, provide positive support system. Participant is able to verbalize types and ability to use techniques and skills needed for reducing stress and depression.  Initial Review & Psychosocial Screening:  Initial Psych Review & Screening - 05/31/22 [id number]       Initial Review   Current issues with Current Anxiety/Panic    Comments Anxious about health and family. Takes meds for anxiety      Family Dynamics   Good Support System? Yes    Comments wife and 2 daughters      Barriers   Psychosocial barriers to participate in program The patient should benefit from training in stress management and relaxation.;There are no identifiable barriers or psychosocial needs.      Screening Interventions   Interventions Encouraged to exercise    Expected Outcomes --             Quality of Life Scores:  Scores of 19 and below usually indicate a poorer quality of life in these areas.  A difference of  2-3 points is a clinically meaningful difference.  A difference of 2-3 points in the total score of the Quality of Life Index has been associated with significant improvement in overall quality of life, self-image, physical symptoms, and general health in studies assessing change in quality of life.  PHQ-9: Review Flowsheet       05/31/2022  Depression screen [facility] 2/9  Decreased Interest 0  Down, Depressed, Hopeless 0  PHQ - 2 Score 0  Altered sleeping 0  Tired, decreased energy 0  Change in appetite 0  Feeling bad or failure about yourself  0  Trouble concentrating 0  Moving slowly or fidgety/restless 0  Suicidal thoughts 0  PHQ-9 Score 0  Difficult doing work/chores Not difficult at all   Interpretation of Total Score  Total Score Depression Severity:  1-4 = Minimal depression, 5-9 = Mild depression, 10-14 = Moderate  depression, 15-19 = Moderately severe depression, 20-27 = Severe depression   Psychosocial Evaluation and Intervention:  Psychosocial Evaluation - 05/31/22 0926       Psychosocial Evaluation & Interventions   Interventions Stress management education;Relaxation education;Encouraged to exercise with the program and follow exercise prescription    Comments Sean Stanley is anxious about his health and his family. He is on meds for his anxiety.    Expected Outcomes For Dan to participate in rehab free of psychosocial concerns.    Continue Psychosocial Services  Follow up required by staff             Psychosocial Re-Evaluation:   Psychosocial Discharge (Final Psychosocial Re-Evaluation):   Education: Education Goals: Education classes will be provided on a weekly basis, covering required topics. Participant will state understanding/return demonstration of topics presented.  Learning Barriers/Preferences:  Learning Barriers/Preferences - 05/31/22 [id number]       Learning Barriers/Preferences   Learning Barriers Sight   wears glasses   Learning Preferences Skilled Demonstration             Education Topics: Introduction to Pulmonary Rehab Group instruction provided by PowerPoint, verbal discussion, and written material to support subject matter. Instructor reviews what Pulmonary Rehab is, the purpose of the program, and how patients are referred.     Know Your Numbers Group instruction that is supported by a PowerPoint presentation. Instructor discusses importance of knowing and understanding resting, exercise, and post-exercise oxygen saturation, heart rate, and blood pressure. Oxygen saturation, heart rate, blood pressure, rating of perceived exertion, and dyspnea are reviewed along with a normal range for these values.    Exercise for the Pulmonary Patient Group instruction that is supported by a PowerPoint presentation. Instructor discusses benefits of exercise, core components of  exercise, frequency, duration, and intensity of an exercise routine, importance of utilizing pulse oximetry during exercise, safety while exercising, and options of places to exercise outside of rehab.       MET Level  Group instruction provided by PowerPoint, verbal discussion, and written material to support subject matter. Instructor reviews what METs are and how to increase METs.    Pulmonary Medications Verbally interactive group education provided by instructor with focus on inhaled medications and proper administration.   Anatomy and Physiology of the Respiratory System Group instruction provided by PowerPoint, verbal discussion, and written material to support subject matter. Instructor reviews respiratory cycle and anatomical components of the respiratory system and their functions. Instructor also reviews differences in obstructive and restrictive respiratory diseases with examples of each.    Oxygen Safety Group instruction provided by PowerPoint, verbal discussion, and written material to support subject matter. There is an overview of "What is Oxygen" and "Why do we need it".  Instructor also reviews how to create a safe environment for oxygen use, the importance of using oxygen as prescribed, and the risks of noncompliance. There is a brief discussion on traveling with oxygen and resources the patient may utilize.   Oxygen Use Group  instruction provided by PowerPoint, verbal discussion, and written material to discuss how supplemental oxygen is prescribed and different types of oxygen supply systems. Resources for more information are provided.    Breathing Techniques Group instruction that is supported by demonstration and informational handouts. Instructor discusses the benefits of pursed lip and diaphragmatic breathing and detailed demonstration on how to perform both.     Risk Factor Reduction Group instruction that is supported by a PowerPoint presentation.  Instructor discusses the definition of a risk factor, different risk factors for pulmonary disease, and how the heart and lungs work together.   MD Day A group question and answer session with a medical doctor that allows participants to ask questions that relate to their pulmonary disease state.   Nutrition for the Pulmonary Patient Group instruction provided by PowerPoint slides, verbal discussion, and written materials to support subject matter. The instructor gives an explanation and review of healthy diet recommendations, which includes a discussion on weight management, recommendations for fruit and vegetable consumption, as well as protein, fluid, caffeine, fiber, sodium, sugar, and alcohol. Tips for eating when patients are short of breath are discussed.    Other Education Group or individual verbal, written, or video instructions that support the educational goals of the pulmonary rehab program.    Knowledge Questionnaire Score:  Knowledge Questionnaire Score - 05/31/22 1047       Knowledge Questionnaire Score   Pre Score 15/18             Core Components/Risk Factors/Patient Goals at Admission:  Personal Goals and Risk Factors at Admission - 05/31/22 0928       Core Components/Risk Factors/Patient Goals on Admission    Weight Management Yes;Weight Loss    Intervention Weight Management: Develop a combined nutrition and exercise program designed to reach desired caloric intake, while maintaining appropriate intake of nutrient and fiber, sodium and fats, and appropriate energy expenditure required for the weight goal.;Weight Management: Provide education and appropriate resources to help participant work on and attain dietary goals.;Weight Management/Obesity: Establish reasonable short term and long term weight goals.    Expected Outcomes Short Term: Continue to assess and modify interventions until short term weight is achieved;Long Term: Adherence to nutrition and  physical activity/exercise program aimed toward attainment of established weight goal;Weight Maintenance: Understanding of the daily nutrition guidelines, which includes 25-35% calories from fat, 7% or less cal from saturated fats, less than '200mg'$  cholesterol, less than 1.5gm of sodium, & 5 or more servings of fruits and vegetables daily;Weight Loss: Understanding of general recommendations for a balanced deficit meal plan, which promotes 1-2 lb weight loss per week and includes a negative energy balance of [phone] kcal/d;Understanding recommendations for meals to include 15-35% energy as protein, 25-35% energy from fat, 35-60% energy from carbohydrates, less than '200mg'$  of dietary cholesterol, 20-35 gm of total fiber daily;Understanding of distribution of calorie intake throughout the day with the consumption of 4-5 meals/snacks    Improve shortness of breath with ADL's Yes    Intervention Provide education, individualized exercise plan and daily activity instruction to help decrease symptoms of SOB with activities of daily living.    Expected Outcomes Short Term: Improve cardiorespiratory fitness to achieve a reduction of symptoms when performing ADLs;Long Term: Be able to perform more ADLs without symptoms or delay the onset of symptoms             Core Components/Risk Factors/Patient Goals Review:    Core Components/Risk Factors/Patient Goals at Discharge (Final Review):  ITP Comments: Dr. Rodman Pickle is Medical Director for Pulmonary Rehab at [facility].

## 2022-06-03 DIAGNOSIS — H26492 Other secondary cataract, left eye: Secondary | ICD-10-CM | POA: Diagnosis not present

## 2022-06-03 NOTE — Progress Notes (Deleted)
Cardiology Office Note:    Date:  06/03/2022   ID:  Sean Stanley, DOB [birth year], MRN [id number]  PCP:  Sean Frames, MD   [facility] Providers Cardiologist:  Sean Patella, MD Electrophysiologist:  Sean Haw, MD {   Referring MD: Sean Stanley, *    History of Present Illness:    Sean Stanley is a 82 y.o. male with a very complicated cardiac and oncology history who was previously followed by Dr. Meda Stanley who now presents to clinic for follow-up.  The patient is a [place] [employer] veteran, he has been followed by Dr. Edwin Stanley and cardiology at [facility].  The patient underwent minimally invasive mitral valve repair in October 2002 at [facility] and has been followed there since then.  He was briefly seen at [facility] for episode of atrial flutter followed by cardioversion in 2014.  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 In May 2015 he underwent atrial flutter ablation.   He has also been followed by Dr. 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 He is currently in remission for both of his cancers. He has residual vocal cord dysfunction.  Saw Dr. Meda Stanley on 03/27/20, the patient was stable from CV standpoint. He underwent TTE at [facility] 10/2019 that showed mild left ventricle dysfunction with LVEF of 45 to 50%, global longitudinal strain -12.8%, moderate concentric left ventricular hypertrophy, normal right ventricular systolic function, status post mitral valve repair with prosthetic mitral valve ring in place, with mild residual mitral regurgitation, moderately dilated left atrium, trivial tricuspid regurgitation, normal size of the right atrium and  normal right-sided pressures.  Saw Dr. Rayann Stanley 03/2021. He requested loop at that time.   Presented to ER in 05/2021 with rapid HR found to be in atypical Aflutter that did not respond to IV metop. Seen in Afib clinic on 05/28/21 and was resumed on apixaban and recommended for DCCV. He had cardioversion on 06/19/21 with return to NSR.   Saw Dr. Curt Stanley in 06/2021 where he remained in NSR and recommended for continued medical therapy.  Repeat TTE 10/2021 with LVEF 50-55%, normal RV, severe biatrial enlargement, normal functioning MVR with no MS/MR, mild aortic root and ascending aortic dilation at 58m and 425mrespectively.  Was last seen in clinic 05/2022 by Dr. CaCurt Stanley he was having more significant palpitations. Was on amiodarone at that time and maintaining NSR. Now planned for ablation.   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[place]   s/p coiling 12/16/12 - [facility]   Atrial flutter (HCC)    BPH (benign prostatic hyperplasia)    Cancer ([place]   melanoma on lower right back and left chest - surgically removed and cleared   Difficult intubation    Dysrhythmia  H/O agent Orange exposure    Headache    migraine- not current   High bilirubin    pt states it's genetic   History of blood transfusion    Hypercholesteremia    Hypercholesterolemia    Lymphoma ([place]) 04/2018   Mitral valve disease    annuloplasty 2002 [facility]   Neuropathy    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  Maxwell Stanley told him he had improved   Paroxysmal atrial fibrillation ([place]) 01/02/2021   Pneumonia    Thoracic ascending aortic aneurysm (HCC)    4.5 cm 03/2018 CT. 4.4 cm on echo 10/2021 and on CTA 03/2021   Thyroid cancer [facility])     Past Surgical History:  Procedure Laterality Date   ABDOMINAL ANGIOGRAM  08/05/2012   aneurysm repair     CARDIOVERSION  02/12/2012   Procedure: CARDIOVERSION;  Surgeon: Sean Casino, MD;  Location: [place];  Service: Cardiovascular;  Laterality: N/A;   CARDIOVERSION N/A 06/22/2013   Procedure: CARDIOVERSION;  Surgeon: Sean Spark, MD;  Location: [place];  Service: Cardiovascular;  Laterality: N/A;   CARDIOVERSION N/A 06/19/2021   Procedure: CARDIOVERSION;  Surgeon: Sean Lean, MD;  Location: [place];  Service: Cardiovascular;  Laterality: N/A;   CATARACT EXTRACTION Bilateral 2018   with lens implant   CHOLECYSTECTOMY     COLONOSCOPY     ESOPHAGOGASTRODUODENOSCOPY     implantable loop recorder placement  03/29/2021   Medtronic Reveal Linq model LNQ 22 [id number]) implantable loop recorder   IR IMAGING GUIDED PORT INSERTION  05/26/2018   IR REMOVAL TUN ACCESS W/ PORT W/O FL MOD SED  02/05/2021   LYMPH NODE BIOPSY Left 04/27/2018   Procedure: EXCISIONAL BIOPSY OF LEFT CERVICAL LYMPH NODE;  Surgeon: Sean Quitter, MD;  Location: [place];  Service: ENT;  Laterality: Left;   LYMPH NODE BIOPSY Left 11/23/2018   Procedure: LEFT CERVICAL LYMPH NODE OPEN BIOPSY;  Surgeon: Sean Quitter, MD;  Location: [place];  Service: ENT;  Laterality: Left;   MENISCUS REPAIR Right 2009   MITRAL VALVE REPAIR  2002   [facility]   NM MYOVIEW LTD  07/22/2006   no ischemia   RADICAL NECK DISSECTION Left 01/15/2019   Procedure: LEFT NECK DISSECTION;  Surgeon: Sean Quitter, MD;  Location: [place];  Service: ENT;  Laterality: Left;   RIGHT HEART CATH  06/19/2004   normal right heart dynamics. EF 50%   TEE WITHOUT CARDIOVERSION  02/12/2012   Procedure:  TRANSESOPHAGEAL ECHOCARDIOGRAM (TEE);  Surgeon: Sean Casino, MD;  [facility] END[facility]COPY;  Service: Cardiovascular;  Laterality: N/A;   TEE WITHOUT CARDIOVERSION N/A 06/22/2013   Procedure: TRANSESOPHAGEAL ECHOCARDIOGRAM (TEE);  Surgeon: Sean Spark, MD;  Location: [place];  Service: Cardiovascular;  Laterality: N/A;   THYROIDECTOMY N/A 01/15/2019   Procedure: TOTAL THYROIDECTOMY;  Surgeon: Sean Quitter, MD;  Location: [place];  Service: ENT;  Laterality: N/A;    Current Medications: No outpatient medications have been marked as taking for the 06/04/22 encounter (Appointment) with Freada Bergeron, MD.     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  Tobacco Use   Smoking status: Never   Smokeless tobacco: Never   Tobacco comments:    Never smoke 04/10/22  Vaping Use   Vaping Use: Never used  Substance and Sexual Activity   Alcohol use: Not Currently    Comment: social   Drug use: No   Sexual activity: Not on file  Other Topics Concern   Not on file  Social History Narrative   Lives in [place], Retired   Science writer Determinants of Radio broadcast assistant Strain: Not on file  Food Insecurity: Not on file  Transportation Needs: Not on file  Physical Activity: Not on file  Stress: Not on file  Social Connections: Not on file     Family History: The patient's family history includes Cancer in his maternal grandmother; Heart attack in his maternal grandfather, paternal grandfather, and paternal grandmother; Heart failure (age of onset: 58) in his father; Parkinson's disease (age of onset: 69) in his  mother.  ROS:   Please see the history of present illness.     Review of Systems  Constitutional:  Negative for chills, fever and malaise/fatigue.  HENT:  Negative for sore throat.   Eyes:  Negative for blurred vision.  Respiratory:  Negative for cough and sputum production.   Cardiovascular:  Positive for leg swelling (Intermittent). Negative for chest pain, palpitations, orthopnea, claudication and PND.  Gastrointestinal:  Negative for nausea and vomiting.  Genitourinary:  Negative for hematuria.  Musculoskeletal:  Positive for myalgias (LE muscle cramps). Negative for falls.  Neurological:  Negative for dizziness and loss of consciousness.  Psychiatric/Behavioral:  Negative for substance abuse. The patient is nervous/anxious (Stress) and has insomnia.      EKGs/Labs/Other Studies Reviewed:    The following studies were reviewed today:  TTE 10/2021: IMPRESSIONS    1. Left ventricular ejection fraction, by estimation, is 50 to 55%. The  left ventricle has normal function. The left ventricle has no regional  wall motion abnormalities. Left ventricular diastolic function could not  be evaluated.   2. Right ventricular systolic function is normal. The right ventricular  size is normal. There is normal pulmonary artery systolic pressure.   3. Left atrial size was severely dilated.   4. Right atrial size was severely dilated.   5. The mitral valve has been repaired/replaced. No evidence of mitral  valve regurgitation. No evidence of mitral stenosis. Procedure Date: 2002.   6. The aortic valve is tricuspid. Aortic valve regurgitation is not  visualized. Aortic valve sclerosis is present, with no evidence of aortic  valve stenosis.   7. There is moderate dilatation of the aortic root, measuring 40 mm.  There is moderate dilatation of the ascending aorta, measuring 44 mm.   8. The inferior vena cava is normal in size with greater than 50%  respiratory variability, suggesting right  atrial pressure of 3 mmHg.  TTE 10/26/20: IMPRESSIONS   1. Left ventricular ejection fraction, by estimation, is 50 to 55%. Left ventricular ejection fraction by 3D volume is 48 %. The left ventricle has mildly decreased function. The left ventricle demonstrates global hypokinesis. Left ventricular diastolic  parameters are consistent with Grade I diastolic dysfunction (impaired relaxation). Elevated left ventricular end-diastolic pressure. The average left ventricular global longitudinal strain is 18.8 %. The global longitudinal strain is normal.   2. Right ventricular systolic function is low normal. The right  ventricular  size is normal. There is normal pulmonary artery systolic pressure.   3. Left atrial size was mild to moderately dilated.   4. Right atrial size was mildly dilated.   5. The mitral valve has been repaired/replaced. Trivial mitral valve regurgitation. The mean mitral valve gradient is 1.0 mmHg with average heart rate of 58 bpm. There is a prosthetic annuloplasty ring present in the mitral position. Procedure Date: 2002.   6. The aortic valve is tricuspid. Aortic valve regurgitation is not visualized. Mild aortic valve sclerosis is present, with no evidence of aortic valve stenosis.   7. Aortic dilatation noted. There is mild dilatation of the aortic root, measuring 39 mm. There is mild dilatation of the ascending aorta, measuring 41 mm.   TTE 10/2019: ECHOCARDIOGRAPHIC DESCRIPTIONS -----------------------------------------------  AORTIC ROOT          Size: Not seen    Dissection: INDETERM FOR DISSECTION   AORTIC VALVE      Leaflets: Tricuspid             Morphology: MILDLY THICKENED      Mobility: Fully Mobile   LEFT VENTRICLE                                      Anterior: Normal          Size: Normal                                 Lateral: Normal   Contraction: Normal                                  Septal: Normal    Closest EF: 50% (Estimated)  Calc.EF: 48% (3D)       Apical: Normal     LV masses: No Masses                             Inferior: Normal           LVH: MODERATE LVH                         Posterior: Normal   LV GLS(AVG): -12.8% Normal Range [ <= -16]  Dias.FxClass: N/A   MITRAL VALVE      Leaflets: ABNORMAL                Mobility: Fully mobile    Morphology: PROSTHETIC RING   LEFT ATRIUM          Size: MILDLY ENLARGED     LA masses: No masses                Normal IAS   MAIN PA          Size: Not seen   PULMONIC VALVE    Morphology: Normal      Mobility: Fully Mobile   RIGHT VENTRICLE          Size: MILDLY ENLARGED           Free wall: Normal   Contraction: Normal                    RV masses: No Masses   TRICUSPID VALVE  Leaflets: Normal                  Mobility: Fully mobile    Morphology: Normal   RIGHT ATRIUM          Size: Normal                     RA Other: None     RA masses: No masses   PERICARDIUM         Fluid: No effusion   INFERIOR VENACAVA          Size: Not Seen   Not Seen   DOPPLER ECHO and OTHER SPECIAL PROCEDURES ------------------------------------     Aortic: No AR                  No AS      Mitral: MILD MR                PROSTHETIC MV RING      1.0 m/s peak vel    4 mmHg peak grad   1 mmHg mean grad     MV Inflow E Vel.= 80.0 cm/s  MV Annulus E'Vel.= 6.0 cm/s  E/E'Ratio= 13   Tricuspid: TRIVIAL TR             No TS   Pulmonary: No PR                  No PS       Other:   INTERPRETATION ---------------------------------------------------------------    MILD LV DYSFUNCTION (See above) WITH MODERATE LVH    NORMAL RIGHT VENTRICULAR SYSTOLIC FUNCTION    VALVULAR REGURGITATION: MILD MR, TRIVIAL TR    PROSTHETIC VALVE(S): PROSTHETIC MV RING    LV EF AT LOWER LIMITS OF NORMAL    3D acquisition and reconstructions were performed as part of this    examination to more accurately quantify the effects of identified    structural abnormalities as part of the exam. (post-processing on an     Independent workstation).     Compared with prior Echo study on 04/20/2013: NO SIGNIFICANT CHANGES   EKG:  EKG is personally reviewed. 12/07/2021:  EKG was not ordered. 07/03/2021 (Dr. Curt Stanley): Sinus rhythm.   Recent Labs: 03/23/2022: TSH 0.673 04/05/2022: Magnesium 2.2 04/10/2022: B Natriuretic Peptide 120.5 04/11/2022: ALT 28; BUN 25; Creatinine 1.43; Hemoglobin 14.4; Platelet Count 145; Potassium 4.3; Sodium 142   Recent Lipid Panel    Component Value Date/Time   CHOL 156 09/03/2021 0749   TRIG 92 09/03/2021 0749   HDL 61 09/03/2021 0749   CHOLHDL 2.6 09/03/2021 0749   CHOLHDL 3.2 02/22/2019 0757   VLDL 23 02/22/2019 0757   LDLCALC 78 09/03/2021 0749     Risk Assessment/Calculations:    CHA2DS2-VASc Score = 4  This indicates a 4.8% annual risk of stroke. The patient's score is based upon: CHF History: 1 HTN History: 0 Diabetes History: 0 Stroke History: 0 Vascular Disease History: 1 Age Score: 2 Gender Score: 0           Physical Exam:    VS:  There were no vitals taken for this visit.    Wt Readings from Last 3 Encounters:  05/31/22 220 lb 7.4 oz (100 kg)  05/29/22 223 lb (101.2 kg)  05/22/22 218 lb (98.9 kg)     GEN:  Well nourished, well developed in no acute distress HEENT: Normal NECK: No JVD; No carotid bruits CARDIAC: RRR, 1/6 soft  systolic murmur. No rubs, gallops RESPIRATORY:  Clear to auscultation without rales, wheezing or rhonchi  ABDOMEN: Soft, non-tender, non-distended MUSCULOSKELETAL:  no edema; No deformity  SKIN: Warm and dry NEUROLOGIC:  Alert and oriented x 3 PSYCHIATRIC:  Normal affect   ASSESSMENT:    No diagnosis found.    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 Overall stable. -Continue metop 12.'5mg'$   BID -Continue SBE PPX  #Heart Failure with mildly Reduced LVEF: LVEF 45-50% on TTE in 10/2019 that improved to 50-55% in 10/2021. Appears  euvolemic today with NYHA class I-II symtpoms. Did not tolerate ACE due to hypotension. -Continue metop 12.'5mg'$  XL daily -Low Na diet -Compression socks for LE edema  #Paroxysmal atrial flutter: S/p ablation in 2015. [facility] bleeding in [facility] past. Developed recurrent Aflutter on 05/27/21 s/p DCCV 06/19/21. May want to consider watchman in the future but patient is hesitant to do so. Follows with Dr. CCurt Stanley -Continue metop 12.'5mg'$  XL daily -Continue apixaban '5mg'$  BID -Follows with Dr. CCurt Stanley #Hyperlipidemia: No known coronary artery disease. Tolerating low dose crestor. -Continue crestor '5mg'$  M,W, F -Continue zetia '10mg'$  daily -Check lipids  #Multiple abdominal aortic aneurysms, status post partial coiling: #Ascending aortic aneurysm: Aneurysmal dilatation of the ascending thoracic aorta measuring 44 x 45 mm -Needs continued serial imaging -BP well controlled -Follows with vascular at [facility]  #Thyroid Cancer: #Non-Hodgkins Lymphoma: In remission. Followed by Dr. GAlvy Stanley -Follow-up with Dr. GAlvy Bimleras scheduled  Follow-up:  6 months.  Medication Adjustments/Labs and Tests Ordered: Current medicines are reviewed at length with the patient today.  Concerns regarding medicines are outlined above.   No orders of the defined types were placed in this encounter.  No orders of the defined types were placed in this encounter.  There are no Patient Instructions on file for this visit.   I,Mathew Stumpf,acting as a sEducation administratorfor HFreada Bergeron MD.,have documented all relevant documentation on the behalf of HFreada Bergeron MD,as directed by  HFreada Bergeron MD while in the presence of HFreada Bergeron MD.  I, HFreada Bergeron MD, have reviewed all documentation for this visit. The documentation on  06/03/22 for the exam, diagnosis, procedures, and orders are all accurate and complete.   Signed, HFreada Bergeron MD  06/03/2022 10:10 AM    [facility]

## 2022-06-04 ENCOUNTER — Ambulatory Visit: Payer: Medicare Other | Admitting: Cardiology

## 2022-06-04 NOTE — Progress Notes (Unsigned)
Cardiology Office Note:    Date:  06/05/2022   ID:  Sean Stanley, DOB May 26, 1940, MRN [id number]  PCP:  Freada Bergeron, MD   [facility] Providers Cardiologist:  None Electrophysiologist:  [name], MD  {   Referring MD: Kathalene Frames, *    History of Present Illness:    Sean Stanley is a 82 y.o. male with a very complicated cardiac and oncology history who was previously followed by Dr. Meda Coffee who now presents to clinic for follow-up.  The patient is a [place] [employer] veteran, he has been followed by Dr. Edwin Dada and cardiology at [facility].  The patient underwent minimally invasive mitral valve repair in October 2002 at [facility] and has been followed there since then.  He was briefly seen at [facility] for episode of atrial flutter followed by cardioversion in 2014.  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 In May 2015 he underwent atrial flutter ablation.   He has also been followed by Dr. 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 He is currently in remission for both of his cancers. He has residual vocal cord dysfunction.  Saw Dr. Meda Coffee on 03/27/20, the patient was stable from CV standpoint. He underwent TTE at [facility] 10/2019 that showed mild left ventricle dysfunction with LVEF of 45 to 50%, global longitudinal strain -12.8%, moderate concentric left ventricular hypertrophy, normal right ventricular systolic function, status post mitral valve repair with prosthetic mitral valve ring in place, with mild residual mitral regurgitation, moderately dilated left atrium, trivial tricuspid regurgitation, normal size of the right atrium and normal right-sided  pressures.  Saw Dr. Rayann Heman 03/2021. He requested loop at that time.   Presented to ER in 05/2021 with rapid HR found to be in atypical Aflutter that did not respond to IV metop. Seen in Afib clinic on 05/28/21 and was resumed on apixaban and recommended for DCCV. He had cardioversion on 06/19/21 with return to NSR.   Saw Dr. Curt Bears in 06/2021 where he remained in NSR and recommended for continued medical therapy.  Repeat TTE 10/2021 with LVEF 50-55%, normal RV, severe biatrial enlargement, normal functioning MVR with no MS/MR, mild aortic root and ascending aortic dilation at 22m and 451mrespectively.  Was last seen in clinic 05/2022 by Dr. CaCurt Bearshere he was having more significant palpitations. Was on amiodarone at that time and maintaining NSR. Now planned for ablation.   Today, the patient states that he has overall felt better on amiodarone. Has been having poor sleep and he is worried that the amiodarone has been causing nightmares. No chest pain, SOB, LE edema, orthopnea, or PND. He is hoping to have a medication to help treat his sleep and anxiety. He is excited to have his ablation in 09/2022.    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   s/p coiling 12/16/12 - [facility]   Atrial flutter (HCC)    BPH (benign prostatic hyperplasia)    Cancer ([place])    melanoma on lower right back and left chest - surgically removed and cleared   Difficult intubation    Dysrhythmia    H/O agent Orange exposure    Headache    migraine- not current   High bilirubin    pt states it's genetic    History of blood transfusion    Hypercholesteremia    Hypercholesterolemia    Lymphoma ([place]) 04/2018   Mitral valve disease    annuloplasty 2002 [facility]   Neuropathy    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 Maxwell Caul told him he had improved   Paroxysmal atrial fibrillation ([place]) 01/02/2021   Pneumonia    Thoracic ascending aortic aneurysm (HCC)    4.5 cm 03/2018 CT. 4.4 cm on echo 10/2021 and on CTA 03/2021   Thyroid cancer [facility])     Past Surgical History:  Procedure Laterality Date   ABDOMINAL ANGIOGRAM  08/05/2012   aneurysm repair     CARDIOVERSION  02/12/2012   Procedure: CARDIOVERSION;  Surgeon: Pixie Casino, MD;  Location: [place];  Service: Cardiovascular;  Laterality: N/A;   CARDIOVERSION N/A 06/22/2013   Procedure: CARDIOVERSION;  Surgeon: [name], MD;  Location: [place];  Service: Cardiovascular;  Laterality: N/A;   CARDIOVERSION N/A 06/19/2021   Procedure: CARDIOVERSION;  Surgeon: Werner Lean, MD;  Location: [place];  Service: Cardiovascular;  Laterality: N/A;   CATARACT EXTRACTION Bilateral 2018   with lens implant   CHOLECYSTECTOMY     COLONOSCOPY     ESOPHAGOGASTRODUODENOSCOPY     implantable loop recorder placement  03/29/2021   Medtronic Reveal Linq model LNQ [id number]) implantable loop recorder   IR IMAGING GUIDED PORT INSERTION  05/26/2018   IR REMOVAL TUN ACCESS W/ PORT W/O FL MOD SED  02/05/2021   LYMPH NODE BIOPSY Left 04/27/2018   Procedure: EXCISIONAL BIOPSY OF LEFT CERVICAL LYMPH NODE;  Surgeon: Melida Quitter, MD;  Location: [place];  Service: ENT;  Laterality: Left;   LYMPH NODE BIOPSY Left 11/23/2018   Procedure: LEFT CERVICAL LYMPH NODE OPEN BIOPSY;  Surgeon: Melida Quitter, MD;  Location: [place];  Service: ENT;  Laterality: Left;   MENISCUS REPAIR Right 2009   MITRAL VALVE REPAIR  2002   [facility]   NM MYOVIEW LTD  07/22/2006    no ischemia   RADICAL NECK DISSECTION Left 01/15/2019   Procedure: LEFT NECK DISSECTION;  Surgeon: Melida Quitter, MD;  Location: [place];  Service: ENT;  Laterality: Left;   RIGHT HEART CATH  06/19/2004   normal right heart dynamics. EF 50%   TEE WITHOUT CARDIOVERSION  02/12/2012   Procedure: TRANSESOPHAGEAL ECHOCARDIOGRAM (TEE);  Surgeon: Pixie Casino, MD;  Location: [facility] END[facility]COPY;  Service: Cardiovascular;  Laterality: N/A;   TEE WITHOUT CARDIOVERSION N/A 06/22/2013   Procedure: TRANSESOPHAGEAL ECHOCARDIOGRAM (TEE);  Surgeon: [name], MD;  Location: [place];  Service: Cardiovascular;  Laterality: N/A;   THYROIDECTOMY N/A 01/15/2019   Procedure: TOTAL THYROIDECTOMY;  Surgeon: Melida Quitter, MD;  Location: [facility] OR;  Service: ENT;  Laterality: N/A;    Current Medications: Current Meds  Medication Sig   acetaminophen (TYLENOL) 500 MG tablet Take 1,000 mg by mouth  daily as needed for moderate pain.   alfuzosin (UROXATRAL) 10 MG 24 hr tablet Take 10 mg by mouth at bedtime.    amiodarone (PACERONE) 200 MG tablet Take 1 tablet (200 mg total) by mouth daily.   apixaban (ELIQUIS) 5 MG TABS tablet Take 1 tablet (5 mg total) by mouth 2 (two) times daily.   buPROPion (WELLBUTRIN XL) 150 MG 24 hr tablet Take 1 tablet (150 mg total) by mouth daily.   Cholecalciferol (VITAMIN D) 2000 units tablet Take 2,000 Units by mouth at bedtime.    ezetimibe (ZETIA) 10 MG tablet Take 1 tablet (10 mg total) by mouth every morning.   finasteride (PROSCAR) 5 MG tablet Take 5 mg by mouth daily with supper.   influenza vaccine adjuvanted (FLUAD) 0.5 ML injection Inject into the muscle.   levothyroxine (SYNTHROID) 175 MCG tablet Take 1 tablet (175 mcg total) by mouth daily.   lidocaine-prilocaine (EMLA) cream Apply to affected area once before sleep time-   metoprolol succinate (TOPROL-XL) 25 MG 24 hr tablet Take 12.5 mg by mouth daily.   Polyethyl Glycol-Propyl Glycol (SYSTANE HYDRATION PF OP) Place 1  drop into both eyes 3 (three) times daily.   rosuvastatin (CRESTOR) 5 MG tablet Take 1 tablet (5 mg total) by mouth every Monday, Wednesday, and Friday.     Allergies:   Levaquin [levofloxacin in d5w], Nickel, Pravastatin, Simvastatin, Allopurinol, Ampicillin, and Penicillin g   Social History   Socioeconomic History   Marital status: Married    Spouse name: [name]   Number of children: 2   Years of education: Not on file   Highest education level: Not on file  Occupational History   Occupation: retired Office manager of [employer]  Tobacco Use   Smoking status: Never   Smokeless tobacco: Never   Tobacco comments:    Never smoke 04/10/22  Vaping Use   Vaping Use: Never used  Substance and Sexual Activity   Alcohol use: Not Currently    Comment: social   Drug use: No   Sexual activity: Not on file  Other Topics Concern   Not on file  Social History Narrative   Lives in [place], Retired   Science writer Determinants of Radio broadcast assistant Strain: Not on file  Food Insecurity: Not on file  Transportation Needs: Not on file  Physical Activity: Not on file  Stress: Not on file  Social Connections: Not on file     Family History: The patient's family history includes Cancer in his maternal grandmother; Heart attack in his maternal grandfather, paternal grandfather, and paternal grandmother; Heart failure (age of onset: 87) in his father; Parkinson's disease (age of onset: 61) in his mother.  ROS:   Please see the history of present illness.     Review of Systems  Constitutional:  Negative for chills, fever and malaise/fatigue.  HENT:  Negative for sore throat.   Eyes:  Negative for blurred vision.  Respiratory:  Negative for cough and sputum production.   Cardiovascular:  Positive for palpitations. Negative for chest pain, orthopnea, claudication, leg swelling and PND.  Gastrointestinal:  Negative for blood in stool.  Genitourinary:  Negative for  hematuria.  Musculoskeletal:  Positive for myalgias (LE muscle cramps). Negative for falls.  Neurological:  Negative for dizziness and loss of consciousness.  Psychiatric/Behavioral:  Negative for substance abuse. The patient is nervous/anxious (Stress) and has insomnia.      EKGs/Labs/Other Studies Reviewed:    The following studies were reviewed  today:  TTE 10/2021: IMPRESSIONS    1. Left ventricular ejection fraction, by estimation, is 50 to 55%. The  left ventricle has normal function. The left ventricle has no regional  wall motion abnormalities. Left ventricular diastolic function could not  be evaluated.   2. Right ventricular systolic function is normal. The right ventricular  size is normal. There is normal pulmonary artery systolic pressure.   3. Left atrial size was severely dilated.   4. Right atrial size was severely dilated.   5. The mitral valve has been repaired/replaced. No evidence of mitral  valve regurgitation. No evidence of mitral stenosis. Procedure Date: 2002.   6. The aortic valve is tricuspid. Aortic valve regurgitation is not  visualized. Aortic valve sclerosis is present, with no evidence of aortic  valve stenosis.   7. There is moderate dilatation of the aortic root, measuring 40 mm.  There is moderate dilatation of the ascending aorta, measuring 44 mm.   8. The inferior vena cava is normal in size with greater than 50%  respiratory variability, suggesting right atrial pressure of 3 mmHg.  TTE 10/26/20: IMPRESSIONS   1. Left ventricular ejection fraction, by estimation, is 50 to 55%. Left ventricular ejection fraction by 3D volume is 48 %. The left ventricle has mildly decreased function. The left ventricle demonstrates global hypokinesis. Left ventricular diastolic  parameters are consistent with Grade I diastolic dysfunction (impaired relaxation). Elevated left ventricular end-diastolic pressure. The average left ventricular global longitudinal strain  is 18.8 %. The global longitudinal strain is normal.   2. Right ventricular systolic function is low normal. The right  ventricular size is normal. There is normal pulmonary artery systolic pressure.   3. Left atrial size was mild to moderately dilated.   4. Right atrial size was mildly dilated.   5. The mitral valve has been repaired/replaced. Trivial mitral valve regurgitation. The mean mitral valve gradient is 1.0 mmHg with average heart rate of 58 bpm. There is a prosthetic annuloplasty ring present in the mitral position. Procedure Date: 2002.   6. The aortic valve is tricuspid. Aortic valve regurgitation is not visualized. Mild aortic valve sclerosis is present, with no evidence of aortic valve stenosis.   7. Aortic dilatation noted. There is mild dilatation of the aortic root, measuring 39 mm. There is mild dilatation of the ascending aorta, measuring 41 mm.   TTE 10/2019: ECHOCARDIOGRAPHIC DESCRIPTIONS -----------------------------------------------  AORTIC ROOT          Size: Not seen    Dissection: INDETERM FOR DISSECTION   AORTIC VALVE      Leaflets: Tricuspid             Morphology: MILDLY THICKENED      Mobility: Fully Mobile   LEFT VENTRICLE                                      Anterior: Normal          Size: Normal                                 Lateral: Normal   Contraction: Normal                                  Septal: Normal    Closest  EF: 50% (Estimated)  Calc.EF: 48% (3D)      Apical: Normal     LV masses: No Masses                             Inferior: Normal           LVH: MODERATE LVH                         Posterior: Normal   LV GLS(AVG): -12.8% Normal Range [ <= -16]  Dias.FxClass: N/A   MITRAL VALVE      Leaflets: ABNORMAL                Mobility: Fully mobile    Morphology: PROSTHETIC RING   LEFT ATRIUM          Size: MILDLY ENLARGED     LA masses: No masses                Normal IAS   MAIN PA          Size: Not seen   PULMONIC VALVE     Morphology: Normal      Mobility: Fully Mobile   RIGHT VENTRICLE          Size: MILDLY ENLARGED           Free wall: Normal   Contraction: Normal                    RV masses: No Masses   TRICUSPID VALVE      Leaflets: Normal                  Mobility: Fully mobile    Morphology: Normal   RIGHT ATRIUM          Size: Normal                     RA Other: None     RA masses: No masses   PERICARDIUM         Fluid: No effusion   INFERIOR VENACAVA          Size: Not Seen   Not Seen   DOPPLER ECHO and OTHER SPECIAL PROCEDURES ------------------------------------     Aortic: No AR                  No AS      Mitral: MILD MR                PROSTHETIC MV RING      1.0 m/s peak vel    4 mmHg peak grad   1 mmHg mean grad     MV Inflow E Vel.= 80.0 cm/s  MV Annulus E'Vel.= 6.0 cm/s  E/E'Ratio= 13   Tricuspid: TRIVIAL TR             No TS   Pulmonary: No PR                  No PS       Other:   INTERPRETATION ---------------------------------------------------------------    MILD LV DYSFUNCTION (See above) WITH MODERATE LVH    NORMAL RIGHT VENTRICULAR SYSTOLIC FUNCTION    VALVULAR REGURGITATION: MILD MR, TRIVIAL TR    PROSTHETIC VALVE(S): PROSTHETIC MV RING    LV EF AT LOWER LIMITS OF NORMAL    3D acquisition and reconstructions were performed as part of  this    examination to more accurately quantify the effects of identified    structural abnormalities as part of the exam. (post-processing on an    Independent workstation).     Compared with prior Echo study on 04/20/2013: NO SIGNIFICANT CHANGES   EKG:  EKG is personally reviewed. 12/07/2021:  EKG was not ordered. 07/03/2021 (Dr. Curt Bears): Sinus rhythm.   Recent Labs: 03/23/2022: TSH 0.673 04/05/2022: Magnesium 2.2 04/10/2022: B Natriuretic Peptide 120.5 04/11/2022: ALT 28; BUN 25; Creatinine 1.43; Hemoglobin 14.4; Platelet Count 145; Potassium 4.3; Sodium 142   Recent Lipid Panel    Component Value Date/Time   CHOL 156  09/03/2021 0749   TRIG 92 09/03/2021 0749   HDL 61 09/03/2021 0749   CHOLHDL 2.6 09/03/2021 0749   CHOLHDL 3.2 02/22/2019 0757   VLDL 23 02/22/2019 0757   LDLCALC 78 09/03/2021 0749     Risk Assessment/Calculations:    CHA2DS2-VASc Score = 4  This indicates a 4.8% annual risk of stroke. The patient's score is based upon: CHF History: 1 HTN History: 0 Diabetes History: 0 Stroke History: 0 Vascular Disease History: 1 Age Score: 2 Gender Score: 0           Physical Exam:    VS:  BP 115/71 (BP Location: Left Arm, Patient Position: Sitting, Cuff Size: Large)   Pulse (!) 59   Ht '6\' 1"'$  (1.854 m)   Wt 223 lb 4.8 oz (101.3 kg)   SpO2 96%   BMI 29.46 kg/m     Wt Readings from Last 3 Encounters:  06/05/22 223 lb 4.8 oz (101.3 kg)  05/31/22 220 lb 7.4 oz (100 kg)  05/29/22 223 lb (101.2 kg)     GEN:  Well nourished, well developed in no acute distress HEENT: Normal NECK: No JVD; No carotid bruits CARDIAC: RRR, 1/6 systolic murmur. No rubs or gallops RESPIRATORY:  Clear to auscultation without rales, wheezing or rhonchi  ABDOMEN: Soft, non-tender, non-distended MUSCULOSKELETAL:  no edema; No deformity  SKIN: Warm and dry NEUROLOGIC:  Alert and oriented x 3 PSYCHIATRIC:  Normal affect   ASSESSMENT:    1. Chronic systolic heart failure ([place])   2. S/P MVR (mitral valve repair)   3. S/P mitral valve repair   4. Persistent atrial fibrillation ([place])   5. Secondary hypercoagulable state ([place])   6. NICM (nonischemic cardiomyopathy) (HCC)   7. Paroxysmal atrial fibrillation ([place])   8. 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 Overall stable. -Continue metop 12.'5mg'$  XL daily -Continue  SBE PPX  #Heart Failure with mildly Reduced LVEF: LVEF 45-50% on TTE in 10/2019 that improved to 50-55% in 10/2021. Appears euvolemic today with NYHA class I-II symtpoms. Did not tolerate ACE due to hypotension. -Continue metop 12.'5mg'$  XL daily -Low Na diet -Compression socks for LE edema  #Paroxysmal atrial flutter: S/p ablation in 2015. [facility] of [facility] bleeding in the past. Developed recurrent Aflutter on 05/27/21 s/p DCCV 06/19/21. Now tolerating apixaban. Planned for repeat ablation with Dr. CCurt Bearsin 09/2022. -Continue amiodarone '200mg'$  daily as bridge to ablation -Plan for ablation in 09/2022 with Dr. CCurt Bears-Continue metop 12.'5mg'$  XL daily -Continue apixaban '5mg'$  BID  #Hyperlipidemia:  No known coronary artery disease. Tolerating low dose crestor. -Continue crestor '5mg'$  M,W, F -Continue zetia '10mg'$  daily -Check lipids  #Multiple abdominal aortic aneurysms, status post partial coiling: #Ascending aortic aneurysm: Aneurysmal dilatation of the ascending thoracic aorta measuring 44 x 45 mm -BP well controlled -Follows with vascular at [facility]  #Thyroid Cancer: #Non-Hodgkins Lymphoma: In remission. Followed by Dr. Alvy Bimler. -Follow-up with Dr. Alvy Bimler as scheduled  Follow-up:  6 months.  Medication Adjustments/Labs and Tests Ordered: Current medicines are reviewed at length with the patient today.  Concerns regarding medicines are outlined above.   No orders of the defined types were placed in this encounter.  No orders of the defined types were placed in this encounter.  Patient Instructions  Medication Instructions:  Your physician recommends that you continue on your current medications as directed. Please refer to the Current Medication list given to you today.  *If you need a refill on your cardiac medications before your next appointment, please call your pharmacy*  Follow-Up: At [facility], you and your health needs are our priority.  As  part of our continuing mission to provide you with exceptional heart care, we have created designated Provider Care Teams.  These Care Teams include your primary Cardiologist (physician) and Advanced Practice Providers (APPs -  Physician Assistants and Nurse Practitioners) who all work together to provide you with the care you need, when you need it.  We recommend signing up for the patient portal called "MyChart".  Sign up information is provided on this After Visit Summary.  MyChart is used to connect with patients for Virtual Visits (Telemedicine).  Patients are able to view lab/test results, encounter notes, upcoming appointments, etc.  Non-urgent messages can be sent to your provider as well.   To learn more about what you can do with MyChart, go to [email or web address].    Your next appointment:   6 month(s)  Provider:   Gwyndolyn Kaufman, MD      Signed, Freada Bergeron, MD  06/05/2022 5:02 PM    [place]

## 2022-06-05 ENCOUNTER — Ambulatory Visit (INDEPENDENT_AMBULATORY_CARE_PROVIDER_SITE_OTHER): Payer: Medicare Other | Admitting: Cardiology

## 2022-06-05 ENCOUNTER — Encounter (HOSPITAL_BASED_OUTPATIENT_CLINIC_OR_DEPARTMENT_OTHER): Payer: Self-pay | Admitting: Cardiology

## 2022-06-05 VITALS — BP 115/71 | HR 59 | Ht 73.0 in | Wt 223.3 lb

## 2022-06-05 DIAGNOSIS — Z9889 Other specified postprocedural states: Secondary | ICD-10-CM | POA: Diagnosis not present

## 2022-06-05 DIAGNOSIS — I5022 Chronic systolic (congestive) heart failure: Secondary | ICD-10-CM

## 2022-06-05 DIAGNOSIS — I480 Paroxysmal atrial fibrillation: Secondary | ICD-10-CM

## 2022-06-05 DIAGNOSIS — I4819 Other persistent atrial fibrillation: Secondary | ICD-10-CM | POA: Diagnosis not present

## 2022-06-05 DIAGNOSIS — I428 Other cardiomyopathies: Secondary | ICD-10-CM | POA: Diagnosis not present

## 2022-06-05 DIAGNOSIS — D6869 Other thrombophilia: Secondary | ICD-10-CM | POA: Diagnosis not present

## 2022-06-05 NOTE — Patient Instructions (Signed)
Medication Instructions:  Your physician recommends that you continue on your current medications as directed. Please refer to the Current Medication list given to you today.  *If you need a refill on your cardiac medications before your next appointment, please call your pharmacy*  Follow-Up: At [facility], you and your health needs are our priority.  As part of our continuing mission to provide you with exceptional heart care, we have created designated Provider Care Teams.  These Care Teams include your primary Cardiologist (physician) and Advanced Practice Providers (APPs -  Physician Assistants and Nurse Practitioners) who all work together to provide you with the care you need, when you need it.  We recommend signing up for the patient portal called "MyChart".  Sign up information is provided on this After Visit Summary.  MyChart is used to connect with patients for Virtual Visits (Telemedicine).  Patients are able to view lab/test results, encounter notes, upcoming appointments, etc.  Non-urgent messages can be sent to your provider as well.   To learn more about what you can do with MyChart, go to [email or web address].    Your next appointment:   6 month(s)  Provider:   Gwyndolyn Kaufman, MD

## 2022-06-05 NOTE — Progress Notes (Signed)
Pulmonary Individual Treatment Plan  Patient Details  Name: Sean Stanley MRN: [id number] Date of Birth: [birth year] Referring Provider:   April Manson Pulmonary Rehab Walk Test from 05/31/2022 in [facility], & [place]  Referring Provider [place]       Initial Encounter Date:  Flowsheet Row Pulmonary Rehab Walk Test from 05/31/2022 in [facility] for Heart, Vascular, & [place]  Date 05/31/22       Visit Diagnosis: Heart failure, chronic systolic ([place])  Patient's Home Medications on Admission:   Current Outpatient Medications:    acetaminophen (TYLENOL) 500 MG tablet, Take 1,000 mg by mouth daily as needed for moderate pain., Disp: , Rfl:    alfuzosin (UROXATRAL) 10 MG 24 hr tablet, Take 10 mg by mouth at bedtime. , Disp: , Rfl:    amiodarone (PACERONE) 200 MG tablet, Take 1 tablet (200 mg total) by mouth daily., Disp: 90 tablet, Rfl: 1   apixaban (ELIQUIS) 5 MG TABS tablet, Take 1 tablet (5 mg total) by mouth 2 (two) times daily., Disp: 60 tablet, Rfl: 3   buPROPion (WELLBUTRIN XL) 150 MG 24 hr tablet, Take 1 tablet (150 mg total) by mouth daily., Disp: 90 tablet, Rfl: 3   Cholecalciferol (VITAMIN D) 2000 units tablet, Take 2,000 Units by mouth at bedtime. , Disp: , Rfl:    ezetimibe (ZETIA) 10 MG tablet, Take 1 tablet (10 mg total) by mouth every morning., Disp: 90 tablet, Rfl: 2   finasteride (PROSCAR) 5 MG tablet, Take 5 mg by mouth daily with supper., Disp: , Rfl:    influenza vaccine adjuvanted (FLUAD) 0.5 ML injection, Inject into the muscle., Disp: 0.5 mL, Rfl: 0   levothyroxine (SYNTHROID) 175 MCG tablet, Take 1 tablet (175 mcg total) by mouth daily., Disp: , Rfl:    lidocaine-prilocaine (EMLA) cream, Apply to affected area once before sleep time-, Disp: 30 g, Rfl: 3   metoprolol succinate (TOPROL-XL) 25 MG 24 hr tablet, Take 12.5 mg by mouth daily., Disp: , Rfl:    Polyethyl Glycol-Propyl Glycol  (SYSTANE HYDRATION PF OP), Place 1 drop into both eyes 3 (three) times daily., Disp: , Rfl:    rosuvastatin (CRESTOR) 5 MG tablet, Take 1 tablet (5 mg total) by mouth every Monday, Wednesday, and Friday., Disp: 36 tablet, Rfl: 3  Past Medical History: Past Medical History:  Diagnosis Date   Abdominal aortic aneurysm, 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, IR could not access culprit lesion and treated with anticoag reversal; no AAA noted on 06/13/17 CTA   Aneurysm artery, 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[place])    followed at [facility]   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 [facility])    being followed at [facility]   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   s/p coiling 12/16/12 - [facility]   Atrial flutter ([facility])    BPH (benign prostatic hyperplasia)    Cancer ([place])    melanoma on lower right back and left chest - surgically removed and cleared   Difficult intubation    Dysrhythmia    H/O agent Orange exposure    Headache    migraine- not current   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 Maxwell Caul told him he had improved   Paroxysmal atrial fibrillation ([place]) 01/02/2021   Pneumonia    Thoracic ascending aortic aneurysm (HCC)    4.5 cm 03/2018 CT. 4.4 cm on echo 10/2021 and on CTA 03/2021   Thyroid cancer ([place])     Tobacco Use: Social History   Tobacco Use  Smoking Status Never  Smokeless Tobacco Never  Tobacco Comments   Never smoke 04/10/22    Labs: Review Flowsheet  More data exists      Latest Ref Rng & Units 02/22/2019 10/26/2020 02/13/2021 06/19/2021 09/03/2021  Labs for ITP Cardiac and Pulmonary Rehab  Cholestrol 100 - 199 mg/dL 185  184   210  - 156   LDL (calc) 0 - 99 mg/dL 104  112  127  - 78   HDL-C >39 mg/dL 58  52  65  - 61   Trlycerides 0 - 149 mg/dL 115  111  100  - 92   TCO2 22 - 32 mmol/L - - - 26  -    Capillary Blood Glucose: Lab Results  Component Value Date   GLUCAP 88 05/24/2019   GLUCAP 95 11/09/2018   GLUCAP 88 08/24/2018     Pulmonary Assessment Scores:  Pulmonary Assessment Scores     Row Name 05/31/22 0932         ADL UCSD   ADL Phase Entry     SOB Score total 7       CAT Score   CAT Score 0       mMRC Score   mMRC Score 2             UCSD: Self-administered rating of dyspnea associated with activities of daily living (ADLs) 6-point scale (0 = "not at all" to 5 = "maximal or unable to do because of breathlessness")  Scoring Scores range from 0 to 120.  Minimally important difference is 5 units  CAT: CAT can identify the health impairment of COPD patients and is better correlated with disease progression.  CAT has a scoring range of zero to 40. The CAT score is classified into four groups of low (less than 10), medium (10 - 20), high (21-30) and very high (31-40) based on the impact level of disease on health status. A CAT score over 10 suggests significant symptoms.  A worsening CAT score could be explained by an exacerbation, poor medication adherence, poor inhaler technique, or progression of COPD or comorbid conditions.  CAT MCID is 2 points  mMRC: mMRC (Modified Medical Research Council) Dyspnea Scale is used to assess the degree of baseline functional disability in patients of respiratory disease due to dyspnea. No minimal important difference is established. A decrease in score of 1 point or greater is considered a positive change.   Pulmonary Function Assessment:  Pulmonary Function Assessment - 05/31/22 0931       Breath   Shortness of Breath Panic with Shortness of Breath;Yes             Exercise Target Goals: Exercise Program Goal: Individual exercise  prescription set using results from initial 6 min walk test and THRR while considering  patient's activity barriers and safety.   Exercise Prescription Goal: Initial exercise prescription builds to 30-45 minutes a day of aerobic activity, 2-3 days per week.  Home exercise guidelines  will be given to patient during program as part of exercise prescription that the participant will acknowledge.  Activity Barriers & Risk Stratification:  Activity Barriers & Cardiac Risk Stratification - 05/31/22 0933       Activity Barriers & Cardiac Risk Stratification   Activity Barriers Deconditioning;Muscular Weakness;Shortness of Breath;Arthritis    Comments neuropathy in feet and meniscus knee surgery             6 Minute Walk:  6 Minute Walk     Row Name 05/31/22 1036         6 Minute Walk   Phase Initial     Distance 1200 feet     Walk Time 6 minutes     # of Rest Breaks 0     MPH 2.27     METS 1.79     RPE 10     Perceived Dyspnea  0     VO2 Peak 6.25     Symptoms No     Resting HR 63 bpm     Resting BP 102/60     Resting Oxygen Saturation  96 %     Exercise Oxygen Saturation  during 6 min walk 95 %     Max Ex. HR 75 bpm     Max Ex. BP 128/66     2 Minute Post BP 120/66       Interval HR   1 Minute HR 67     2 Minute HR 71     3 Minute HR 71     4 Minute HR 75     5 Minute HR 74     6 Minute HR 74     2 Minute Post HR 74     Interval Heart Rate? Yes       Interval Oxygen   Interval Oxygen? Yes     Baseline Oxygen Saturation % 96 %     1 Minute Oxygen Saturation % 95 %     1 Minute Liters of Oxygen 0 L     2 Minute Oxygen Saturation % 95 %     2 Minute Liters of Oxygen 0 L     3 Minute Oxygen Saturation % 95 %     3 Minute Liters of Oxygen 0 L     4 Minute Oxygen Saturation % 95 %     4 Minute Liters of Oxygen 0 L     5 Minute Oxygen Saturation % 95 %     5 Minute Liters of Oxygen 0 L     6 Minute Oxygen Saturation % 95 %     6 Minute Liters of Oxygen 0 L      2 Minute Post Oxygen Saturation % 96 %     2 Minute Post Liters of Oxygen 0 L              Oxygen Initial Assessment:  Oxygen Initial Assessment - 05/31/22 0930       Home Oxygen   Home Oxygen Device None    Sleep Oxygen Prescription None    Home Exercise Oxygen Prescription None    Home Resting Oxygen Prescription None      Initial 6 min Walk   Oxygen Used None      Program Oxygen Prescription   Program Oxygen Prescription None      Intervention   Short Term Goals To learn and understand importance of maintaining oxygen saturations>88%;To learn and demonstrate proper use of respiratory  medications;To learn and understand importance of monitoring SPO2 with pulse oximeter and demonstrate accurate use of the pulse oximeter.;To learn and demonstrate proper pursed lip breathing techniques or other breathing techniques.     Long  Term Goals Verbalizes importance of monitoring SPO2 with pulse oximeter and return demonstration;Maintenance of O2 saturations>88%;Exhibits proper breathing techniques, such as pursed lip breathing or other method taught during program session             Oxygen Re-Evaluation:  Oxygen Re-Evaluation     Row Name 06/04/22 0917             Program Oxygen Prescription   Program Oxygen Prescription None         Home Oxygen   Home Oxygen Device None       Sleep Oxygen Prescription None       Home Exercise Oxygen Prescription None       Home Resting Oxygen Prescription None         Goals/Expected Outcomes   Short Term Goals To learn and understand importance of maintaining oxygen saturations>88%;To learn and demonstrate proper use of respiratory medications;To learn and understand importance of monitoring SPO2 with pulse oximeter and demonstrate accurate use of the pulse oximeter.;To learn and demonstrate proper pursed lip breathing techniques or other breathing techniques.        Long  Term Goals Verbalizes importance of monitoring SPO2 with pulse  oximeter and return demonstration;Maintenance of O2 saturations>88%;Exhibits proper breathing techniques, such as pursed lip breathing or other method taught during program session       Goals/Expected Outcomes Compliance and understanding of oxygen saturation monitoring and breathing techniques to decrease shortness of breath                Oxygen Discharge (Final Oxygen Re-Evaluation):  Oxygen Re-Evaluation - 06/04/22 0917       Program Oxygen Prescription   Program Oxygen Prescription None      Home Oxygen   Home Oxygen Device None    Sleep Oxygen Prescription None    Home Exercise Oxygen Prescription None    Home Resting Oxygen Prescription None      Goals/Expected Outcomes   Short Term Goals To learn and understand importance of maintaining oxygen saturations>88%;To learn and demonstrate proper use of respiratory medications;To learn and understand importance of monitoring SPO2 with pulse oximeter and demonstrate accurate use of the pulse oximeter.;To learn and demonstrate proper pursed lip breathing techniques or other breathing techniques.     Long  Term Goals Verbalizes importance of monitoring SPO2 with pulse oximeter and return demonstration;Maintenance of O2 saturations>88%;Exhibits proper breathing techniques, such as pursed lip breathing or other method taught during program session    Goals/Expected Outcomes Compliance and understanding of oxygen saturation monitoring and breathing techniques to decrease shortness of breath             Initial Exercise Prescription:  Initial Exercise Prescription - 05/31/22 1000       Date of Initial Exercise RX and Referring Provider   Date 05/31/22    Referring Provider Pemberton    Expected Discharge Date 08/27/22      Arm Ergometer   Level 1    Watts 5    RPM 20    Minutes 15      Elliptical   Level 1    Speed 1    Minutes 15      Prescription Details   Frequency (times per week) 2    Duration Progress to  30  minutes of continuous aerobic without signs/symptoms of physical distress      Intensity   THRR 40-80% of Max Heartrate 56-111    Ratings of Perceived Exertion 11-13    Perceived Dyspnea 0-4      Progression   Progression Continue progressive overload as per policy without signs/symptoms or physical distress.      Resistance Training   Training Prescription Yes    Weight blue bands    Reps 10-15             Perform Capillary Blood Glucose checks as needed.  Exercise Prescription Changes:   Exercise Comments:   Exercise Goals and Review:   Exercise Goals     Row Name 05/31/22 0934 06/04/22 0911           Exercise Goals   Increase Physical Activity Yes Yes      Intervention Provide advice, education, support and counseling about physical activity/exercise needs.;Develop an individualized exercise prescription for aerobic and resistive training based on initial evaluation findings, risk stratification, comorbidities and participant's personal goals. Provide advice, education, support and counseling about physical activity/exercise needs.;Develop an individualized exercise prescription for aerobic and resistive training based on initial evaluation findings, risk stratification, comorbidities and participant's personal goals.      Expected Outcomes Short Term: Attend rehab on a regular basis to increase amount of physical activity.;Long Term: Add in home exercise to make exercise part of routine and to increase amount of physical activity.;Long Term: Exercising regularly at least 3-5 days a week. Short Term: Attend rehab on a regular basis to increase amount of physical activity.;Long Term: Add in home exercise to make exercise part of routine and to increase amount of physical activity.;Long Term: Exercising regularly at least 3-5 days a week.      Increase Strength and Stamina Yes Yes      Intervention Provide advice, education, support and counseling about physical  activity/exercise needs.;Develop an individualized exercise prescription for aerobic and resistive training based on initial evaluation findings, risk stratification, comorbidities and participant's personal goals. Provide advice, education, support and counseling about physical activity/exercise needs.;Develop an individualized exercise prescription for aerobic and resistive training based on initial evaluation findings, risk stratification, comorbidities and participant's personal goals.      Expected Outcomes Short Term: Increase workloads from initial exercise prescription for resistance, speed, and METs.;Short Term: Perform resistance training exercises routinely during rehab and add in resistance training at home;Long Term: Improve cardiorespiratory fitness, muscular endurance and strength as measured by increased METs and functional capacity (6MWT) Short Term: Increase workloads from initial exercise prescription for resistance, speed, and METs.;Short Term: Perform resistance training exercises routinely during rehab and add in resistance training at home;Long Term: Improve cardiorespiratory fitness, muscular endurance and strength as measured by increased METs and functional capacity (6MWT)      Able to understand and use rate of perceived exertion (RPE) scale Yes Yes      Intervention Provide education and explanation on how to use RPE scale Provide education and explanation on how to use RPE scale      Expected Outcomes Short Term: Able to use RPE daily in rehab to express subjective intensity level;Long Term:  Able to use RPE to guide intensity level when exercising independently Short Term: Able to use RPE daily in rehab to express subjective intensity level;Long Term:  Able to use RPE to guide intensity level when exercising independently      Knowledge and understanding of Target Heart Rate Range (  THRR) Yes Yes      Intervention Provide education and explanation of THRR including how the numbers  were predicted and where they are located for reference Provide education and explanation of THRR including how the numbers were predicted and where they are located for reference      Expected Outcomes Short Term: Able to state/look up THRR;Long Term: Able to use THRR to govern intensity when exercising independently;Short Term: Able to use daily as guideline for intensity in rehab Short Term: Able to state/look up THRR;Long Term: Able to use THRR to govern intensity when exercising independently;Short Term: Able to use daily as guideline for intensity in rehab      Understanding of Exercise Prescription Yes Yes      Intervention Provide education, explanation, and written materials on patient's individual exercise prescription Provide education, explanation, and written materials on patient's individual exercise prescription      Expected Outcomes Short Term: Able to explain program exercise prescription;Long Term: Able to explain home exercise prescription to exercise independently Short Term: Able to explain program exercise prescription;Long Term: Able to explain home exercise prescription to exercise independently               Exercise Goals Re-Evaluation :  Exercise Goals Re-Evaluation     Row Name 06/04/22 0911             Exercise Goal Re-Evaluation   Exercise Goals Review Increase Physical Activity;Able to understand and use Dyspnea scale;Understanding of Exercise Prescription;Increase Strength and Stamina;Knowledge and understanding of Target Heart Rate Range (THRR);Able to understand and use rate of perceived exertion (RPE) scale       Comments Pt is to begin exercise on 3/14. Will monitor for progression.       Expected Outcomes Through exercise at rehab and home the patient will decrease shortness of breath with daily activities and feel confident in carrying out an exercise regimn at home                Discharge Exercise Prescription (Final Exercise Prescription  Changes):   Nutrition:  Target Goals: Understanding of nutrition guidelines, daily intake of sodium '1500mg'$ , cholesterol '200mg'$ , calories 30% from fat and 7% or less from saturated fats, daily to have 5 or more servings of fruits and vegetables.  Biometrics:  Pre Biometrics - 05/31/22 0916       Pre Biometrics   Grip Strength 48 kg              Nutrition Therapy Plan and Nutrition Goals:   Nutrition Assessments:  MEDIFICTS Score Key: ?70 Need to make dietary changes  40-70 Heart Healthy Diet ? 40 Therapeutic Level Cholesterol Diet   Picture Your Plate Scores: [id number] Unhealthy dietary pattern with much room for improvement. 41-50 Dietary pattern unlikely to meet recommendations for good health and room for improvement. 51-60 More healthful dietary pattern, with some room for improvement.  >60 Healthy dietary pattern, although there may be some specific behaviors that could be improved.    Nutrition Goals Re-Evaluation:   Nutrition Goals Discharge (Final Nutrition Goals Re-Evaluation):   Psychosocial: Target Goals: Acknowledge presence or absence of significant depression and/or stress, maximize coping skills, provide positive support system. Participant is able to verbalize types and ability to use techniques and skills needed for reducing stress and depression.  Initial Review & Psychosocial Screening:  Initial Psych Review & Screening - 05/31/22 [id number]       Initial Review   Current issues with Current Anxiety/Panic  Comments Anxious about health and family. Takes meds for anxiety      Family Dynamics   Good Support System? Yes    Comments wife and 2 daughters      Barriers   Psychosocial barriers to participate in program The patient should benefit from training in stress management and relaxation.;There are no identifiable barriers or psychosocial needs.      Screening Interventions   Interventions Encouraged to exercise    Expected Outcomes --              Quality of Life Scores:  Scores of 19 and below usually indicate a poorer quality of life in these areas.  A difference of  2-3 points is a clinically meaningful difference.  A difference of 2-3 points in the total score of the Quality of Life Index has been associated with significant improvement in overall quality of life, self-image, physical symptoms, and general health in studies assessing change in quality of life.  PHQ-9: Review Flowsheet       05/31/2022  Depression screen PHQ 2/9  Decreased Interest 0  Down, Depressed, Hopeless 0  PHQ - 2 Score 0  Altered sleeping 0  Tired, decreased energy 0  Change in appetite 0  Feeling bad or failure about yourself  0  Trouble concentrating 0  Moving slowly or fidgety/restless 0  Suicidal thoughts 0  PHQ-9 Score 0  Difficult doing work/chores Not difficult at all   Interpretation of Total Score  Total Score Depression Severity:  1-4 = Minimal depression, 5-9 = Mild depression, 10-14 = Moderate depression, 15-19 = Moderately severe depression, 20-27 = Severe depression   Psychosocial Evaluation and Intervention:  Psychosocial Evaluation - 05/31/22 0926       Psychosocial Evaluation & Interventions   Interventions Stress management education;Relaxation education;Encouraged to exercise with the program and follow exercise prescription    Comments Linna Hoff is anxious about his health and his family. He is on meds for his anxiety.    Expected Outcomes For Dan to participate in rehab free of psychosocial concerns.    Continue Psychosocial Services  Follow up required by staff             Psychosocial Re-Evaluation:  Psychosocial Re-Evaluation     [place] Name 05/31/22 1518             Psychosocial Re-Evaluation   Current issues with Current Anxiety/Panic       Comments Linna Hoff is scheduled to start PR on 3/14. We will continue to monitor him.       Expected Outcomes For Dan to participate in PR free of any psychosocial  barriers or concerns       Interventions Encouraged to attend Pulmonary Rehabilitation for the exercise       Continue Psychosocial Services  No Follow up required                Psychosocial Discharge (Final Psychosocial Re-Evaluation):  Psychosocial Re-Evaluation - 05/31/22 1518       Psychosocial Re-Evaluation   Current issues with Current Anxiety/Panic    Comments Linna Hoff is scheduled to start PR on 3/14. We will continue to monitor him.    Expected Outcomes For Dan to participate in PR free of any psychosocial barriers or concerns    Interventions Encouraged to attend Pulmonary Rehabilitation for the exercise    Continue Psychosocial Services  No Follow up required             Education: Education Goals: Education  classes will be provided on a weekly basis, covering required topics. Participant will state understanding/return demonstration of topics presented.  Learning Barriers/Preferences:  Learning Barriers/Preferences - 05/31/22 [id number]       Learning Barriers/Preferences   Learning Barriers Sight   wears glasses   Learning Preferences Skilled Demonstration             Education Topics: Introduction to Pulmonary Rehab Group instruction provided by PowerPoint, verbal discussion, and written material to support subject matter. Instructor reviews what Pulmonary Rehab is, the purpose of the program, and how patients are referred.     Know Your Numbers Group instruction that is supported by a PowerPoint presentation. Instructor discusses importance of knowing and understanding resting, exercise, and post-exercise oxygen saturation, heart rate, and blood pressure. Oxygen saturation, heart rate, blood pressure, rating of perceived exertion, and dyspnea are reviewed along with a normal range for these values.    Exercise for the Pulmonary Patient Group instruction that is supported by a PowerPoint presentation. Instructor discusses benefits of exercise, core components  of exercise, frequency, duration, and intensity of an exercise routine, importance of utilizing pulse oximetry during exercise, safety while exercising, and options of places to exercise outside of rehab.    MET Level  Group instruction provided by PowerPoint, verbal discussion, and written material to support subject matter. Instructor reviews what METs are and how to increase METs.    Pulmonary Medications Verbally interactive group education provided by instructor with focus on inhaled medications and proper administration.   Anatomy and Physiology of the Respiratory System Group instruction provided by PowerPoint, verbal discussion, and written material to support subject matter. Instructor reviews respiratory cycle and anatomical components of the respiratory system and their functions. Instructor also reviews differences in obstructive and restrictive respiratory diseases with examples of each.    Oxygen Safety Group instruction provided by PowerPoint, verbal discussion, and written material to support subject matter. There is an overview of "What is Oxygen" and "Why do we need it".  Instructor also reviews how to create a safe environment for oxygen use, the importance of using oxygen as prescribed, and the risks of noncompliance. There is a brief discussion on traveling with oxygen and resources the patient may utilize.   Oxygen Use Group instruction provided by PowerPoint, verbal discussion, and written material to discuss how supplemental oxygen is prescribed and different types of oxygen supply systems. Resources for more information are provided.    Breathing Techniques Group instruction that is supported by demonstration and informational handouts. Instructor discusses the benefits of pursed lip and diaphragmatic breathing and detailed demonstration on how to perform both.     Risk Factor Reduction Group instruction that is supported by a PowerPoint presentation. Instructor  discusses the definition of a risk factor, different risk factors for pulmonary disease, and how the heart and lungs work together.   MD Day A group question and answer session with a medical doctor that allows participants to ask questions that relate to their pulmonary disease state.   Nutrition for the Pulmonary Patient Group instruction provided by PowerPoint slides, verbal discussion, and written materials to support subject matter. The instructor gives an explanation and review of healthy diet recommendations, which includes a discussion on weight management, recommendations for fruit and vegetable consumption, as well as protein, fluid, caffeine, fiber, sodium, sugar, and alcohol. Tips for eating when patients are short of breath are discussed.    Other Education Group or individual verbal, written, or video instructions that support the  educational goals of the pulmonary rehab program.    Knowledge Questionnaire Score:  Knowledge Questionnaire Score - 05/31/22 1047       Knowledge Questionnaire Score   Pre Score 15/18             Core Components/Risk Factors/Patient Goals at Admission:  Personal Goals and Risk Factors at Admission - 05/31/22 0928       Core Components/Risk Factors/Patient Goals on Admission    Weight Management Yes;Weight Loss    Intervention Weight Management: Develop a combined nutrition and exercise program designed to reach desired caloric intake, while maintaining appropriate intake of nutrient and fiber, sodium and fats, and appropriate energy expenditure required for the weight goal.;Weight Management: Provide education and appropriate resources to help participant work on and attain dietary goals.;Weight Management/Obesity: Establish reasonable short term and long term weight goals.    Expected Outcomes Short Term: Continue to assess and modify interventions until short term weight is achieved;Long Term: Adherence to nutrition and physical  activity/exercise program aimed toward attainment of established weight goal;Weight Maintenance: Understanding of the daily nutrition guidelines, which includes 25-35% calories from fat, 7% or less cal from saturated fats, less than '200mg'$  cholesterol, less than 1.5gm of sodium, & 5 or more servings of fruits and vegetables daily;Weight Loss: Understanding of general recommendations for a balanced deficit meal plan, which promotes 1-2 lb weight loss per week and includes a negative energy balance of [phone] kcal/d;Understanding recommendations for meals to include 15-35% energy as protein, 25-35% energy from fat, 35-60% energy from carbohydrates, less than '200mg'$  of dietary cholesterol, 20-35 gm of total fiber daily;Understanding of distribution of calorie intake throughout the day with the consumption of 4-5 meals/snacks    Improve shortness of breath with ADL's Yes    Intervention Provide education, individualized exercise plan and daily activity instruction to help decrease symptoms of SOB with activities of daily living.    Expected Outcomes Short Term: Improve cardiorespiratory fitness to achieve a reduction of symptoms when performing ADLs;Long Term: Be able to perform more ADLs without symptoms or delay the onset of symptoms             Core Components/Risk Factors/Patient Goals Review:   Goals and Risk Factor Review     Row Name 05/31/22 1520             Core Components/Risk Factors/Patient Goals Review   Personal Goals Review Improve shortness of breath with ADL's;Develop more efficient breathing techniques such as purse lipped breathing and diaphragmatic breathing and practicing self-pacing with activity.;Weight Management/Obesity       Review Linna Hoff is scheduled to start PR on 3/14       Expected Outcomes See admission goals                Core Components/Risk Factors/Patient Goals at Discharge (Final Review):   Goals and Risk Factor Review - 05/31/22 1520       Core  Components/Risk Factors/Patient Goals Review   Personal Goals Review Improve shortness of breath with ADL's;Develop more efficient breathing techniques such as purse lipped breathing and diaphragmatic breathing and practicing self-pacing with activity.;Weight Management/Obesity    Review Linna Hoff is scheduled to start PR on 3/14    Expected Outcomes See admission goals             ITP Comments:   Comments: Dr. Rodman Pickle is Medical Director for Pulmonary Rehab at [facility].

## 2022-06-06 ENCOUNTER — Encounter (HOSPITAL_COMMUNITY)
Admission: RE | Admit: 2022-06-06 | Discharge: 2022-06-06 | Disposition: A | Discharge status: Home / Self Care | Payer: Medicare Other | Source: Ambulatory Visit | Attending: Cardiology | Admitting: Cardiology

## 2022-06-06 DIAGNOSIS — C73 Malignant neoplasm of thyroid gland: Secondary | ICD-10-CM | POA: Diagnosis not present

## 2022-06-06 DIAGNOSIS — Z5189 Encounter for other specified aftercare: Secondary | ICD-10-CM | POA: Diagnosis not present

## 2022-06-06 DIAGNOSIS — I5022 Chronic systolic (congestive) heart failure: Secondary | ICD-10-CM

## 2022-06-06 DIAGNOSIS — E890 Postprocedural hypothyroidism: Secondary | ICD-10-CM | POA: Diagnosis not present

## 2022-06-06 NOTE — Progress Notes (Signed)
Daily Session Note  Patient Details  Name: BRYCETON YANO MRN: [id number] Date of Birth: 05-31-1940 Referring Provider:   April Manson Pulmonary Rehab Walk Test from 05/31/2022 in [facility], Vascular, & Lung Health  Referring Provider Johney Frame       Encounter Date: 06/06/2022  Check In:  Session Check In - 06/06/22 1414       Check-In   Supervising physician immediately available to respond to emergencies [facility] MD immediately available    Physician(s) Dr. Veneda Melter    Location [facility]    Staff Present Janine Ores, RN, Quentin Ore, MS, ACSM-CEP, Exercise Physiologist;Other;Randi Yevonne Pax, ACSM-CEP, Exercise Physiologist    Virtual Visit No    Medication changes reported     No    Fall or balance concerns reported    No    Tobacco Cessation No Change    Warm-up and Cool-down Not performed (comment)    Resistance Training Performed No    VAD Patient? No    PAD/SET Patient? No      Pain Assessment   Currently in Pain? No/denies    Multiple Pain Sites No             Capillary Blood Glucose: No results found for this or any previous visit (from the past 24 hour(s)).    Social History   Tobacco Use  Smoking Status Never  Smokeless Tobacco Never  Tobacco Comments   Never smoke 04/10/22    Goals Met:  Exercise tolerated well No report of concerns or symptoms today Strength training completed today  Goals Unmet:  Not Applicable  Comments: Service time is from 1313 to Starr    Dr. Rodman Pickle is Medical Director for Pulmonary Rehab at [facility].

## 2022-06-11 ENCOUNTER — Encounter (HOSPITAL_COMMUNITY)
Admission: RE | Admit: 2022-06-11 | Discharge: 2022-06-11 | Disposition: A | Discharge status: Home / Self Care | Payer: Medicare Other | Source: Ambulatory Visit | Attending: Cardiology | Admitting: Cardiology

## 2022-06-11 VITALS — Wt 222.0 lb

## 2022-06-11 DIAGNOSIS — I5022 Chronic systolic (congestive) heart failure: Secondary | ICD-10-CM | POA: Diagnosis not present

## 2022-06-11 DIAGNOSIS — Z5189 Encounter for other specified aftercare: Secondary | ICD-10-CM | POA: Diagnosis not present

## 2022-06-11 NOTE — Progress Notes (Signed)
Daily Session Note  Patient Details  Name: Sean Stanley MRN: [id number] Date of Birth: 1940-10-21 Referring Provider:   April Manson Pulmonary Rehab Walk Test from 05/31/2022 in [facility] for Heart, Vascular, & Lung Health  Referring Provider Johney Frame       Encounter Date: 06/11/2022  Check In:  Session Check In - 06/11/22 1522       Check-In   Supervising physician immediately available to respond to emergencies [facility] MD immediately available    Physician(s) Eric Form, NP    [facility]    Staff Present Janine Ores, RN, Quentin Ore, MS, ACSM-CEP, Exercise Physiologist;Other    Virtual Visit No    Medication changes reported     No    Fall or balance concerns reported    No    Tobacco Cessation No Change    Warm-up and Cool-down Performed as group-led instruction    Resistance Training Performed Yes    VAD Patient? No    PAD/SET Patient? No      Pain Assessment   Currently in Pain? No/denies    Multiple Pain Sites No             Capillary Blood Glucose: No results found for this or any previous visit (from the past 24 hour(s)).   Exercise Prescription Changes - 06/11/22 1500       Response to Exercise   Blood Pressure (Admit) 100/56    Blood Pressure (Exercise) 150/68    Blood Pressure (Exit) 102/62    Heart Rate (Admit) 56 bpm    Heart Rate (Exercise) 66 bpm    Heart Rate (Exit) 56 bpm    Oxygen Saturation (Admit) 96 %    Oxygen Saturation (Exercise) 97 %    Oxygen Saturation (Exit) 96 %    Rating of Perceived Exertion (Exercise) 10    Perceived Dyspnea (Exercise) 1    Duration Progress to 30 minutes of  aerobic without signs/symptoms of physical distress    Intensity THRR unchanged      Progression   Progression Continue to progress workloads to maintain intensity without signs/symptoms of physical distress.      Resistance Training   Training Prescription Yes    Weight blue bands    Reps  10-15    Time 10 Minutes      Arm Ergometer   Level 1    Watts 1    RPM 53    Minutes 15      Elliptical   Level 1    Speed 1    Minutes 15             Social History   Tobacco Use  Smoking Status Never  Smokeless Tobacco Never  Tobacco Comments   Never smoke 04/10/22    Goals Met:  Proper associated with RPD/PD & O2 Sat Independence with exercise equipment Exercise tolerated well No report of concerns or symptoms today Strength training completed today  Goals Unmet:  Not Applicable  Comments: Service time is from 1324 to 1450.    Dr. Rodman Pickle is Medical Director for Pulmonary Rehab at [facility].

## 2022-06-13 ENCOUNTER — Encounter (HOSPITAL_COMMUNITY)
Admission: RE | Admit: 2022-06-13 | Discharge: 2022-06-13 | Disposition: A | Discharge status: Home / Self Care | Payer: Medicare Other | Source: Ambulatory Visit | Attending: Cardiology | Admitting: Cardiology

## 2022-06-13 DIAGNOSIS — I5022 Chronic systolic (congestive) heart failure: Secondary | ICD-10-CM | POA: Diagnosis not present

## 2022-06-13 DIAGNOSIS — Z5189 Encounter for other specified aftercare: Secondary | ICD-10-CM | POA: Diagnosis not present

## 2022-06-13 NOTE — Progress Notes (Signed)
Daily Session Note  Patient Details  Name: Sean Stanley MRN: [id number] Date of Birth: May 31, 1940 Referring Provider:   April Manson Pulmonary Rehab Walk Test from 05/31/2022 in [facility] for Heart, Vascular, & Lung Health  Referring Provider Johney Frame       Encounter Date: 06/13/2022  Check In:  Session Check In - 06/13/22 1521       Check-In   Supervising physician immediately available to respond to emergencies [facility] immediately available    Physician(s) Shalhoub    [facility]    Staff Present Janine Ores, RN, Quentin Ore, MS, ACSM-CEP, Exercise Physiologist;Other;Samantha [place], RD, LDN;Jetta Walker BS, ACSM-CEP, Exercise Physiologist    Virtual Visit No    Medication changes reported     No    Fall or balance concerns reported    No    Tobacco Cessation No Change    Warm-up and Cool-down Performed as group-led instruction    Resistance Training Performed Yes    VAD Patient? No    PAD/SET Patient? No      Pain Assessment   Currently in Pain? No/denies    Multiple Pain Sites No             Capillary Blood Glucose: No results found for this or any previous visit (from the past 24 hour(s)).    Social History   Tobacco Use  Smoking Status Never  Smokeless Tobacco Never  Tobacco Comments   Never smoke 04/10/22    Goals Met:  Proper associated with RPD/PD & O2 Sat Exercise tolerated well No report of concerns or symptoms today Strength training completed today  Goals Unmet:  Not Applicable  Comments: Service time is from 1314 to 1450.    Dr. Rodman Pickle is Medical Director for Pulmonary Rehab at [facility].

## 2022-06-17 ENCOUNTER — Encounter: Payer: Medicare Other | Admitting: Cardiology

## 2022-06-18 ENCOUNTER — Encounter (HOSPITAL_COMMUNITY)
Admission: RE | Admit: 2022-06-18 | Discharge: 2022-06-18 | Disposition: A | Discharge status: Home / Self Care | Payer: Medicare Other | Source: Ambulatory Visit | Attending: Cardiology | Admitting: Cardiology

## 2022-06-18 DIAGNOSIS — I5022 Chronic systolic (congestive) heart failure: Secondary | ICD-10-CM

## 2022-06-18 DIAGNOSIS — Z5189 Encounter for other specified aftercare: Secondary | ICD-10-CM | POA: Diagnosis not present

## 2022-06-18 NOTE — Progress Notes (Signed)
Daily Session Note  Patient Details  Name: NNAEMEKA DRUM MRN: [id number] Date of Birth: 04/05/1940 Referring Provider:   April Manson Pulmonary Rehab Walk Test from 05/31/2022 in [facility] for Heart, Vascular, & Lung Health  Referring Provider Johney Frame       Encounter Date: 06/18/2022  Check In:  Session Check In - 06/18/22 1443       Check-In   Supervising physician immediately available to respond to emergencies [facility] immediately available    Physician(s) Nevada Crane, PA    [facility]    Staff Present Josephina Shih BS, ACSM-CEP, Exercise Physiologist;Aoki Wedemeyer Estil Daft, MS, ACSM-CEP, Exercise Physiologist;Mary Margette Fast, RN, BSN    Virtual Visit No    Medication changes reported     No    Fall or balance concerns reported    No    Tobacco Cessation No Change    Warm-up and Cool-down Performed as group-led instruction    Resistance Training Performed Yes    VAD Patient? No    PAD/SET Patient? No      Pain Assessment   Currently in Pain? No/denies    Multiple Pain Sites No             Capillary Blood Glucose: No results found for this or any previous visit (from the past 24 hour(s)).    Social History   Tobacco Use  Smoking Status Never  Smokeless Tobacco Never  Tobacco Comments   Never smoke 04/10/22    Goals Met:  Proper associated with RPD/PD & O2 Sat Independence with exercise equipment Exercise tolerated well No report of concerns or symptoms today Strength training completed today  Goals Unmet:  Not Applicable  Comments: Service time is from 1311 to 1450.    Dr. Rodman Pickle is Medical Director for Pulmonary Rehab at [facility].

## 2022-06-20 ENCOUNTER — Encounter (HOSPITAL_COMMUNITY)
Admission: RE | Admit: 2022-06-20 | Discharge: 2022-06-20 | Disposition: A | Discharge status: Home / Self Care | Payer: Medicare Other | Source: Ambulatory Visit | Attending: Cardiology | Admitting: Cardiology

## 2022-06-20 DIAGNOSIS — I5022 Chronic systolic (congestive) heart failure: Secondary | ICD-10-CM

## 2022-06-20 DIAGNOSIS — Z5189 Encounter for other specified aftercare: Secondary | ICD-10-CM | POA: Diagnosis not present

## 2022-06-20 NOTE — Progress Notes (Signed)
Daily Session Note  Patient Details  Name: GIAVONNI HEADLEE MRN: [id number] Date of Birth: 08-Oct-1940 Referring Provider:   April Manson Pulmonary Rehab Walk Test from 05/31/2022 in [facility] for Heart, Vascular, & Lung Health  Referring Provider Johney Frame       Encounter Date: 06/20/2022  Check In:  Session Check In - 06/20/22 1606       Check-In   Supervising physician immediately available to respond to emergencies [facility] immediately available    Physician(s) Dr. Vaughan Browner    [facility]    Staff Present Josephina Shih BS, ACSM-CEP, Exercise Physiologist;Casey Estil Daft, MS, ACSM-CEP, Exercise Physiologist;Cleo Villamizar Margette Fast, RN, Milus Glazier, MS, ACSM-CEP, CCRP, Exercise Physiologist;Jetta Walker BS, ACSM-CEP, Exercise Physiologist    Virtual Visit No    Medication changes reported     No    Fall or balance concerns reported    No    Tobacco Cessation No Change    Warm-up and Cool-down Performed as group-led instruction    Resistance Training Performed Yes    VAD Patient? No    PAD/SET Patient? No      Pain Assessment   Currently in Pain? No/denies    Multiple Pain Sites No             Capillary Blood Glucose: No results found for this or any previous visit (from the past 24 hour(s)).    Social History   Tobacco Use  Smoking Status Never  Smokeless Tobacco Never  Tobacco Comments   Never smoke 04/10/22    Goals Met:  Independence with exercise equipment Exercise tolerated well No report of concerns or symptoms today Strength training completed today  Goals Unmet:  Not Applicable  Comments: Service time is from 1317 to 1502    Dr. Rodman Pickle is Medical Director for Pulmonary Rehab at [facility].

## 2022-06-24 ENCOUNTER — Ambulatory Visit (INDEPENDENT_AMBULATORY_CARE_PROVIDER_SITE_OTHER): Payer: Medicare Other

## 2022-06-24 DIAGNOSIS — I480 Paroxysmal atrial fibrillation: Secondary | ICD-10-CM

## 2022-06-24 LAB — CUP PACEART REMOTE DEVICE CHECK
Date Time Interrogation Session: 20240331231226
Implantable Pulse Generator Implant Date: 20230105

## 2022-06-25 ENCOUNTER — Encounter (HOSPITAL_COMMUNITY)
Admission: RE | Admit: 2022-06-25 | Discharge: 2022-06-25 | Disposition: A | Discharge status: Home / Self Care | Payer: Medicare Other | Source: Ambulatory Visit | Attending: Cardiology | Admitting: Cardiology

## 2022-06-25 VITALS — Wt 219.4 lb

## 2022-06-25 DIAGNOSIS — I5022 Chronic systolic (congestive) heart failure: Secondary | ICD-10-CM | POA: Insufficient documentation

## 2022-06-25 NOTE — Progress Notes (Signed)
Daily Session Note  Patient Details  Name: Sean Stanley MRN: [id number] Date of Birth: 1941-03-21 Referring Provider:   April Manson Pulmonary Rehab Walk Test from 05/31/2022 in [facility] for Heart, Vascular, & Lung Health  Referring Provider Johney Frame       Encounter Date: 06/25/2022  Check In:  Session Check In - 06/25/22 1436       Check-In   Supervising physician immediately available to respond to emergencies [facility] immediately available    Physician(s) Ambrose Pancoast, NP    [facility]    Staff Present Josephina Shih BS, ACSM-CEP, Exercise Physiologist;Casey Estil Daft, MS, ACSM-CEP, Exercise Physiologist;Samantha [place], RD, Calla Kicks, RN, BSN;Carlette Wilber Oliphant, RN, BSN    Virtual Visit No    Medication changes reported     No    Fall or balance concerns reported    No    Tobacco Cessation No Change    Warm-up and Cool-down Performed as group-led Higher education careers adviser Performed Yes    VAD Patient? No    PAD/SET Patient? No      Pain Assessment   Currently in Pain? No/denies    Multiple Pain Sites No             Capillary Blood Glucose: No results found for this or any previous visit (from the past 24 hour(s)).   Exercise Prescription Changes - 06/25/22 1600       Response to Exercise   Blood Pressure (Admit) 102/58    Blood Pressure (Exercise) 130/80    Blood Pressure (Exit) 112/70    Heart Rate (Admit) 58 bpm    Heart Rate (Exercise) 68 bpm    Heart Rate (Exit) 59 bpm    Oxygen Saturation (Admit) 95 %    Oxygen Saturation (Exercise) 95 %    Oxygen Saturation (Exit) 97 %    Rating of Perceived Exertion (Exercise) 11    Perceived Dyspnea (Exercise) 0    Duration Continue with 30 min of aerobic exercise without signs/symptoms of physical distress.    Intensity THRR unchanged      Progression   Progression Continue to progress workloads to maintain intensity without  signs/symptoms of physical distress.      Resistance Training   Training Prescription Yes    Weight black bands    Reps 10-15    Time 10 Minutes      Arm Ergometer   Level 4    Watts 15    RPM 83    Minutes 15      Recumbant Elliptical   Level 3    Minutes 15    METs 3.9             Social History   Tobacco Use  Smoking Status Never  Smokeless Tobacco Never  Tobacco Comments   Never smoke 04/10/22    Goals Met:  Proper associated with RPD/PD & O2 Sat Exercise tolerated well No report of concerns or symptoms today Strength training completed today  Goals Unmet:  Not Applicable  Comments: Service time is from 1307 to 1448.    Dr. Rodman Pickle is Medical Director for Pulmonary Rehab at [facility].

## 2022-06-27 ENCOUNTER — Encounter (HOSPITAL_COMMUNITY)
Admission: RE | Admit: 2022-06-27 | Discharge: 2022-06-27 | Disposition: A | Discharge status: Home / Self Care | Payer: Medicare Other | Source: Ambulatory Visit | Attending: Cardiology | Admitting: Cardiology

## 2022-06-27 DIAGNOSIS — I5022 Chronic systolic (congestive) heart failure: Secondary | ICD-10-CM | POA: Diagnosis not present

## 2022-06-27 NOTE — Progress Notes (Signed)
Daily Session Note  Patient Details  Name: Sean Stanley MRN: [id number] Date of Birth: June 20, 1940 Referring Provider:   April Manson Pulmonary Rehab Walk Test from 05/31/2022 in [facility] for Heart, Vascular, & Lung Health  Referring Provider Johney Frame       Encounter Date: 06/27/2022  Check In:  Session Check In - 06/27/22 1443       Check-In   Supervising physician immediately available to respond to emergencies [facility] MD immediately available    Physician(s) Coletta Memos, PA    [facility]    Staff Present Josephina Shih BS, ACSM-CEP, Exercise Physiologist;Samanthan Dugo Rosana Hoes, MS, ACSM-CEP, Exercise Physiologist;Samantha [place], RD, Idalia Needle, MS, Exercise Physiologist;Mary Margette Fast, RN, BSN;Jetta Walker BS, ACSM-CEP, Exercise Physiologist    Virtual Visit No    Medication changes reported     No    Fall or balance concerns reported    No    Tobacco Cessation No Change    Warm-up and Cool-down Performed as group-led instruction    Resistance Training Performed Yes    VAD Patient? No    PAD/SET Patient? No      Pain Assessment   Currently in Pain? No/denies    Multiple Pain Sites No             Capillary Blood Glucose: No results found for this or any previous visit (from the past 24 hour(s)).    Social History   Tobacco Use  Smoking Status Never  Smokeless Tobacco Never  Tobacco Comments   Never smoke 04/10/22    Goals Met:  Proper associated with RPD/PD & O2 Sat Exercise tolerated well No report of concerns or symptoms today Strength training completed today  Goals Unmet:  Not Applicable  Comments: Service time is from 1309 to 1503.    Dr. Rodman Pickle is Medical Director for Pulmonary Rehab at [facility].

## 2022-06-28 NOTE — Progress Notes (Signed)
[facility] Summary Report / Loop Recorder 

## 2022-07-02 ENCOUNTER — Encounter (HOSPITAL_COMMUNITY)
Admission: RE | Admit: 2022-07-02 | Discharge: 2022-07-02 | Disposition: A | Discharge status: Home / Self Care | Payer: Medicare Other | Source: Ambulatory Visit | Attending: Cardiology | Admitting: Cardiology

## 2022-07-02 DIAGNOSIS — I5022 Chronic systolic (congestive) heart failure: Secondary | ICD-10-CM | POA: Diagnosis not present

## 2022-07-02 NOTE — Progress Notes (Signed)
Daily Session Note  Patient Details  Name: Sean Stanley MRN: [id number] Date of Birth: 1940-03-31 Referring Provider:   Doristine Devoid Pulmonary Rehab Walk Test from 05/31/2022 in [facility] for Heart, Vascular, & Lung Health  Referring Provider Shari Prows       Encounter Date: 07/02/2022  Check In:  Session Check In - 07/02/22 1511       Check-In   Supervising physician immediately available to respond to emergencies [facility] MD immediately available    Physician(s) Edd Fabian, PA    [facility]    Staff Present Elissa Lovett BS, ACSM-CEP, Exercise Physiologist;Kaylee Earlene Plater, MS, ACSM-CEP, Exercise Physiologist;Jonda Alanis Gerre Scull, RN, Fuller Plan, RT    Virtual Visit No    Medication changes reported     No    Fall or balance concerns reported    No    Tobacco Cessation No Change    Warm-up and Cool-down Performed as group-led instruction    Resistance Training Performed Yes    VAD Patient? No    PAD/SET Patient? No      Pain Assessment   Currently in Pain? No/denies    Multiple Pain Sites No             Capillary Blood Glucose: No results found for this or any previous visit (from the past 24 hour(s)).    Social History   Tobacco Use  Smoking Status Never  Smokeless Tobacco Never  Tobacco Comments   Never smoke 04/10/22    Goals Met:  Independence with exercise equipment Exercise tolerated well No report of concerns or symptoms today Strength training completed today  Goals Unmet:  Not Applicable  Comments: Service time is from 1310 to 1438    Dr. Mechele Collin is Medical Director for Pulmonary Rehab at [facility].

## 2022-07-02 NOTE — Progress Notes (Signed)
Home Exercise Prescription I have reviewed a Home Exercise Prescription with Sean Stanley. He is currently doing pool aerobic exercise x3 a week. Encouraged pt to continue. He likes to walk but struggles to walk long due to peripheral neuropathy. He has goals to lose weight. Encouraged him to look at gym equipment for when he graduates [employer]. The patient stated that their goals were to tone his chest and lose weight. We reviewed exercise guidelines, target heart rate during exercise, RPE Scale, weather conditions, endpoints for exercise, warmup and cool down. The patient is encouraged to come to me with any questions. I will continue to follow up with the patient to assist them with progression and safety.    Kerry Odonohue [place], [place], ACSM-CEP 07/02/2022 4:01 PM

## 2022-07-03 NOTE — Progress Notes (Signed)
Pulmonary Individual Treatment Plan  Patient Details  Name: Sean Stanley MRN: [id number] Date of Birth: Dec 10, 1940 Referring Provider:   Doristine Devoid Pulmonary Rehab Walk Test from 05/31/2022 in [facility]  Referring Provider Pemberton       Initial Encounter Date:  Flowsheet Row Pulmonary Rehab Walk Test from 05/31/2022 in [facility], [facility]  Date 05/31/22       Visit Diagnosis: Heart failure, chronic systolic  Patient's Home Medications on Admission:   Current Outpatient Medications:    acetaminophen (TYLENOL) 500 MG tablet, Take 1,000 mg by mouth daily as needed for moderate pain., Disp: , Rfl:    alfuzosin (UROXATRAL) 10 MG 24 hr tablet, Take 10 mg by mouth at bedtime. , Disp: , Rfl:    amiodarone (PACERONE) 200 MG tablet, Take 1 tablet (200 mg total) by mouth daily., Disp: 90 tablet, Rfl: 1   apixaban (ELIQUIS) 5 MG TABS tablet, Take 1 tablet (5 mg total) by mouth 2 (two) times daily., Disp: 60 tablet, Rfl: 3   buPROPion (WELLBUTRIN XL) 150 MG 24 hr tablet, Take 1 tablet (150 mg total) by mouth daily., Disp: 90 tablet, Rfl: 3   Cholecalciferol (VITAMIN D) 2000 units tablet, Take 2,000 Units by mouth at bedtime. , Disp: , Rfl:    ezetimibe (ZETIA) 10 MG tablet, Take 1 tablet (10 mg total) by mouth every morning., Disp: 90 tablet, Rfl: 2   finasteride (PROSCAR) 5 MG tablet, Take 5 mg by mouth daily with supper., Disp: , Rfl:    influenza vaccine adjuvanted (FLUAD) 0.5 ML injection, Inject into the muscle., Disp: 0.5 mL, Rfl: 0   levothyroxine (SYNTHROID) 175 MCG tablet, Take 1 tablet (175 mcg total) by mouth daily., Disp: , Rfl:    lidocaine-prilocaine (EMLA) cream, Apply to affected area once before sleep time-, Disp: 30 g, Rfl: 3   metoprolol succinate (TOPROL-XL) 25 MG 24 hr tablet, Take 12.5 mg by mouth daily., Disp: , Rfl:    Polyethyl Glycol-Propyl Glycol (SYSTANE  HYDRATION PF OP), Place 1 drop into both eyes 3 (three) times daily., Disp: , Rfl:    rosuvastatin (CRESTOR) 5 MG tablet, Take 1 tablet (5 mg total) by mouth every Monday, Wednesday, and Friday., Disp: 36 tablet, Rfl: 3  Past Medical History: 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 [facility])    being followed at [facility]   Aneurysm of splenic artery (HCC) 2014   s/p coiling 12/16/12 - [facility]   Atrial flutter (HCC)    BPH (benign prostatic hyperplasia)    Cancer (HCC)    melanoma on lower right back and left chest - surgically removed and cleared   Difficult intubation    Dysrhythmia    H/O agent Orange exposure    Headache    migraine- not current   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 Earl Gala told him he had improved   Paroxysmal atrial fibrillation (HCC) 01/02/2021   Pneumonia    Thoracic ascending aortic aneurysm (HCC)    4.5 cm 03/2018 CT. 4.4 cm on echo 10/2021 and on CTA 03/2021   Thyroid cancer (HCC)     Tobacco Use: Social History   Tobacco Use  Smoking Status Never  Smokeless Tobacco Never  Tobacco Comments   Never smoke 04/10/22    Labs: Review Flowsheet  More data exists      Latest Ref Rng & Units 02/22/2019 10/26/2020 02/13/2021 06/19/2021 09/03/2021  Labs for ITP Cardiac and Pulmonary Rehab  Cholestrol 100 - 199 mg/dL 161  096  045  -  409   LDL (calc) 0 - 99 mg/dL 811  914  782  - 78   HDL-C >39 mg/dL 58  52  65  - 61   Trlycerides 0 - 149 mg/dL 956  213  086  - 92   TCO2 22 - 32 mmol/L - - - 26  -    Capillary Blood Glucose: Lab Results  Component Value Date   GLUCAP 88 05/24/2019   GLUCAP 95 11/09/2018   GLUCAP 88 08/24/2018     Pulmonary Assessment Scores:  Pulmonary Assessment Scores     Row Name 05/31/22 0932         ADL UCSD   ADL Phase Entry     SOB Score total 7       CAT Score   CAT Score 0       mMRC Score   mMRC Score 2             UCSD: Self-administered rating of dyspnea associated with activities of daily living (ADLs) 6-point scale (0 = "not at all" to 5 = "maximal or unable to do because of breathlessness")  Scoring Scores range from 0 to 120.  Minimally important difference is 5 units  CAT: CAT can identify the health impairment of COPD patients and is better correlated with disease progression.  CAT has a scoring range of zero to 40. The CAT score is classified into four groups of low (less than 10), medium (10 - 20), high (21-30) and very high (31-40) based on the impact level of disease on health status. A CAT score over 10 suggests significant symptoms.  A worsening CAT score could be explained by an exacerbation, poor medication adherence, poor inhaler technique, or progression of COPD or comorbid conditions.  CAT MCID is 2 points  mMRC: mMRC (Modified Medical Research Council) Dyspnea Scale is used to assess the degree of baseline functional disability in patients of respiratory disease due to dyspnea. No minimal important difference is established. A decrease in score of 1 point or greater is considered a positive change.   Pulmonary Function Assessment:  Pulmonary Function Assessment - 05/31/22 0931       Breath   Shortness of Breath Panic with Shortness of Breath;Yes             Exercise Target Goals: Exercise Program Goal: Individual exercise  prescription set using results from initial 6 min walk test and THRR while considering  patient's activity barriers and safety.   Exercise Prescription Goal: Initial exercise prescription builds to 30-45 minutes a day of aerobic activity, 2-3 days per week.  Home exercise guidelines will  be given to patient during program as part of exercise prescription that the participant will acknowledge.  Activity Barriers & Risk Stratification:  Activity Barriers & Cardiac Risk Stratification - 05/31/22 0933       Activity Barriers & Cardiac Risk Stratification   Activity Barriers Deconditioning;Muscular Weakness;Shortness of Breath;Arthritis    Comments neuropathy in feet and meniscus knee surgery             6 Minute Walk:  6 Minute Walk     Row Name 05/31/22 1036         6 Minute Walk   Phase Initial     Distance 1200 feet     Walk Time 6 minutes     # of Rest Breaks 0     MPH 2.27     METS 1.79     RPE 10     Perceived Dyspnea  0     VO2 Peak 6.25     Symptoms No     Resting HR 63 bpm     Resting BP 102/60     Resting Oxygen Saturation  96 %     Exercise Oxygen Saturation  during 6 min walk 95 %     Max Ex. HR 75 bpm     Max Ex. BP 128/66     2 Minute Post BP 120/66       Interval HR   1 Minute HR 67     2 Minute HR 71     3 Minute HR 71     4 Minute HR 75     5 Minute HR 74     6 Minute HR 74     2 Minute Post HR 74     Interval Heart Rate? Yes       Interval Oxygen   Interval Oxygen? Yes     Baseline Oxygen Saturation % 96 %     1 Minute Oxygen Saturation % 95 %     1 Minute Liters of Oxygen 0 L     2 Minute Oxygen Saturation % 95 %     2 Minute Liters of Oxygen 0 L     3 Minute Oxygen Saturation % 95 %     3 Minute Liters of Oxygen 0 L     4 Minute Oxygen Saturation % 95 %     4 Minute Liters of Oxygen 0 L     5 Minute Oxygen Saturation % 95 %     5 Minute Liters of Oxygen 0 L     6 Minute Oxygen Saturation % 95 %     6 Minute Liters of Oxygen 0 L      2 Minute Post Oxygen Saturation % 96 %     2 Minute Post Liters of Oxygen 0 L              Oxygen Initial Assessment:  Oxygen Initial Assessment - 05/31/22 0930       Home Oxygen   Home Oxygen Device None    Sleep Oxygen Prescription None    Home Exercise Oxygen Prescription None    Home Resting Oxygen Prescription None      Initial 6 min Walk   Oxygen Used None      Program Oxygen Prescription   Program Oxygen Prescription None      Intervention   Short Term Goals To learn and understand importance of maintaining oxygen saturations>88%;To learn and demonstrate proper use of respiratory medications;To  learn and understand importance of monitoring SPO2 with pulse oximeter and demonstrate accurate use of the pulse oximeter.;To learn and demonstrate proper pursed lip breathing techniques or other breathing techniques.     Long  Term Goals Verbalizes importance of monitoring SPO2 with pulse oximeter and return demonstration;Maintenance of O2 saturations>88%;Exhibits proper breathing techniques, such as pursed lip breathing or other method taught during program session             Oxygen Re-Evaluation:  Oxygen Re-Evaluation     Row Name 06/04/22 0917 06/25/22 0939           Program Oxygen Prescription   Program Oxygen Prescription None None        Home Oxygen   Home Oxygen Device None None      Sleep Oxygen Prescription None None      Home Exercise Oxygen Prescription None None      Home Resting Oxygen Prescription None None        Goals/Expected Outcomes   Short Term Goals To learn and understand importance of maintaining oxygen saturations>88%;To learn and demonstrate proper use of respiratory medications;To learn and understand importance of monitoring SPO2 with pulse oximeter and demonstrate accurate use of the pulse oximeter.;To learn and demonstrate proper pursed lip breathing techniques or other breathing techniques.  To learn and understand importance of  maintaining oxygen saturations>88%;To learn and demonstrate proper use of respiratory medications;To learn and understand importance of monitoring SPO2 with pulse oximeter and demonstrate accurate use of the pulse oximeter.;To learn and demonstrate proper pursed lip breathing techniques or other breathing techniques.       Long  Term Goals Verbalizes importance of monitoring SPO2 with pulse oximeter and return demonstration;Maintenance of O2 saturations>88%;Exhibits proper breathing techniques, such as pursed lip breathing or other method taught during program session Verbalizes importance of monitoring SPO2 with pulse oximeter and return demonstration;Maintenance of O2 saturations>88%;Exhibits proper breathing techniques, such as pursed lip breathing or other method taught during program session      Goals/Expected Outcomes Compliance and understanding of oxygen saturation monitoring and breathing techniques to decrease shortness of breath Compliance and understanding of oxygen saturation monitoring and breathing techniques to decrease shortness of breath               Oxygen Discharge (Final Oxygen Re-Evaluation):  Oxygen Re-Evaluation - 06/25/22 0939       Program Oxygen Prescription   Program Oxygen Prescription None      Home Oxygen   Home Oxygen Device None    Sleep Oxygen Prescription None    Home Exercise Oxygen Prescription None    Home Resting Oxygen Prescription None      Goals/Expected Outcomes   Short Term Goals To learn and understand importance of maintaining oxygen saturations>88%;To learn and demonstrate proper use of respiratory medications;To learn and understand importance of monitoring SPO2 with pulse oximeter and demonstrate accurate use of the pulse oximeter.;To learn and demonstrate proper pursed lip breathing techniques or other breathing techniques.     Long  Term Goals Verbalizes importance of monitoring SPO2 with pulse oximeter and return demonstration;Maintenance  of O2 saturations>88%;Exhibits proper breathing techniques, such as pursed lip breathing or other method taught during program session    Goals/Expected Outcomes Compliance and understanding of oxygen saturation monitoring and breathing techniques to decrease shortness of breath             Initial Exercise Prescription:  Initial Exercise Prescription - 05/31/22 1000       Date of  Initial Exercise RX and Referring Provider   Date 05/31/22    Referring Provider Pemberton    Expected Discharge Date 08/27/22      Arm Ergometer   Level 1    Watts 5    RPM 20    Minutes 15      Elliptical   Level 1    Speed 1    Minutes 15      Prescription Details   Frequency (times per week) 2    Duration Progress to 30 minutes of continuous aerobic without signs/symptoms of physical distress      Intensity   THRR 40-80% of Max Heartrate 56-111    Ratings of Perceived Exertion 11-13    Perceived Dyspnea 0-4      Progression   Progression Continue progressive overload as per policy without signs/symptoms or physical distress.      Resistance Training   Training Prescription Yes    Weight blue bands    Reps 10-15             Perform Capillary Blood Glucose checks as needed.  Exercise Prescription Changes:   Exercise Prescription Changes     Row Name 06/11/22 1500 06/25/22 1600 07/02/22 1500         Response to Exercise   Blood Pressure (Admit) 100/56 102/58 --     Blood Pressure (Exercise) 150/68 130/80 --     Blood Pressure (Exit) 102/62 112/70 --     Heart Rate (Admit) 56 bpm 58 bpm --     Heart Rate (Exercise) 66 bpm 68 bpm --     Heart Rate (Exit) 56 bpm 59 bpm --     Oxygen Saturation (Admit) 96 % 95 % --     Oxygen Saturation (Exercise) 97 % 95 % --     Oxygen Saturation (Exit) 96 % 97 % --     Rating of Perceived Exertion (Exercise) 10 11 --     Perceived Dyspnea (Exercise) 1 0 --     Duration Progress to 30 minutes of  aerobic without signs/symptoms of  physical distress Continue with 30 min of aerobic exercise without signs/symptoms of physical distress. --     Intensity THRR unchanged THRR unchanged --       Progression   Progression Continue to progress workloads to maintain intensity without signs/symptoms of physical distress. Continue to progress workloads to maintain intensity without signs/symptoms of physical distress. --       Paramedic Prescription Yes Yes --     Weight blue bands black bands --     Reps 10-15 10-15 --     Time 10 Minutes 10 Minutes --       Arm Ergometer   Level 1 4 --     Watts 1 15 --     RPM 53 83 --     Minutes 15 15 --       Recumbant Elliptical   Level -- 3 --     Minutes -- 15 --     METs -- 3.9 --       Elliptical   Level 1 -- --     Speed 1 -- --     Minutes 15 -- --       Home Exercise Plan   Plans to continue exercise at -- -- [employer] (comment)  pool     Frequency -- -- --  n/a     Initial Home Exercises Provided -- --  07/02/22              Exercise Comments:   Exercise Comments     Row Name 07/02/22 1559           Exercise Comments Discussed with pt home exercise plan. He is currently doing pool aerobic exercise x3 a week. Encouraged pt to continue. He likes to walk but struggles to walk long due to peripheral neuropathy. He has goals to lose weight. Encouraged him to look at gym equipment for when he graduates [employer].                Exercise Goals and Review:   Exercise Goals     Row Name 05/31/22 0934 06/04/22 0911 06/25/22 0931         Exercise Goals   Increase Physical Activity Yes Yes Yes     Intervention Provide advice, education, support and counseling about physical activity/exercise needs.;Develop an individualized exercise prescription for aerobic and resistive training based on initial evaluation findings, risk stratification, comorbidities and participant's personal goals. Provide advice, education, support and  counseling about physical activity/exercise needs.;Develop an individualized exercise prescription for aerobic and resistive training based on initial evaluation findings, risk stratification, comorbidities and participant's personal goals. Provide advice, education, support and counseling about physical activity/exercise needs.;Develop an individualized exercise prescription for aerobic and resistive training based on initial evaluation findings, risk stratification, comorbidities and participant's personal goals.     Expected Outcomes Short Term: Attend rehab on a regular basis to increase amount of physical activity.;Long Term: Add in home exercise to make exercise part of routine and to increase amount of physical activity.;Long Term: Exercising regularly at least 3-5 days a week. Short Term: Attend rehab on a regular basis to increase amount of physical activity.;Long Term: Add in home exercise to make exercise part of routine and to increase amount of physical activity.;Long Term: Exercising regularly at least 3-5 days a week. Short Term: Attend rehab on a regular basis to increase amount of physical activity.;Long Term: Add in home exercise to make exercise part of routine and to increase amount of physical activity.;Long Term: Exercising regularly at least 3-5 days a week.     Increase Strength and Stamina Yes Yes Yes     Intervention Provide advice, education, support and counseling about physical activity/exercise needs.;Develop an individualized exercise prescription for aerobic and resistive training based on initial evaluation findings, risk stratification, comorbidities and participant's personal goals. Provide advice, education, support and counseling about physical activity/exercise needs.;Develop an individualized exercise prescription for aerobic and resistive training based on initial evaluation findings, risk stratification, comorbidities and participant's personal goals. Provide advice,  education, support and counseling about physical activity/exercise needs.;Develop an individualized exercise prescription for aerobic and resistive training based on initial evaluation findings, risk stratification, comorbidities and participant's personal goals.     Expected Outcomes Short Term: Increase workloads from initial exercise prescription for resistance, speed, and METs.;Short Term: Perform resistance training exercises routinely during rehab and add in resistance training at home;Long Term: Improve cardiorespiratory fitness, muscular endurance and strength as measured by increased METs and functional capacity ( ) Short Term: Increase workloads from initial exercise prescription for resistance, speed, and METs.;Short Term: Perform resistance training exercises routinely during rehab and add in resistance training at home;Long Term: Improve cardiorespiratory fitness, muscular endurance and strength as measured by increased METs and functional capacity ( ) Short Term: Increase workloads from initial exercise prescription for resistance, speed, and METs.;Short Term: Perform resistance training exercises routinely during rehab and  add in resistance training at home;Long Term: Improve cardiorespiratory fitness, muscular endurance and strength as measured by increased METs and functional capacity ( )     Able to understand and use rate of perceived exertion (RPE) scale Yes Yes Yes     Intervention Provide education and explanation on how to use RPE scale Provide education and explanation on how to use RPE scale Provide education and explanation on how to use RPE scale     Expected Outcomes Short Term: Able to use RPE daily in rehab to express subjective intensity level;Long Term:  Able to use RPE to guide intensity level when exercising independently Short Term: Able to use RPE daily in rehab to express subjective intensity level;Long Term:  Able to use RPE to guide intensity level when exercising  independently Short Term: Able to use RPE daily in rehab to express subjective intensity level;Long Term:  Able to use RPE to guide intensity level when exercising independently     Knowledge and understanding of Target Heart Rate Range (THRR) Yes Yes Yes     Intervention Provide education and explanation of THRR including how the numbers were predicted and where they are located for reference Provide education and explanation of THRR including how the numbers were predicted and where they are located for reference Provide education and explanation of THRR including how the numbers were predicted and where they are located for reference     Expected Outcomes Short Term: Able to state/look up THRR;Long Term: Able to use THRR to govern intensity when exercising independently;Short Term: Able to use daily as guideline for intensity in rehab Short Term: Able to state/look up THRR;Long Term: Able to use THRR to govern intensity when exercising independently;Short Term: Able to use daily as guideline for intensity in rehab Short Term: Able to state/look up THRR;Long Term: Able to use THRR to govern intensity when exercising independently;Short Term: Able to use daily as guideline for intensity in rehab     Understanding of Exercise Prescription Yes Yes Yes     Intervention Provide education, explanation, and written materials on patient's individual exercise prescription Provide education, explanation, and written materials on patient's individual exercise prescription Provide education, explanation, and written materials on patient's individual exercise prescription     Expected Outcomes Short Term: Able to explain program exercise prescription;Long Term: Able to explain home exercise prescription to exercise independently Short Term: Able to explain program exercise prescription;Long Term: Able to explain home exercise prescription to exercise independently Short Term: Able to explain program exercise  prescription;Long Term: Able to explain home exercise prescription to exercise independently              Exercise Goals Re-Evaluation :  Exercise Goals Re-Evaluation     Row Name 06/04/22 0911 06/25/22 0931           Exercise Goal Re-Evaluation   Exercise Goals Review Increase Physical Activity;Able to understand and use Dyspnea scale;Understanding of Exercise Prescription;Increase Strength and Stamina;Knowledge and understanding of Target Heart Rate Range (THRR);Able to understand and use rate of perceived exertion (RPE) scale Increase Physical Activity;Able to understand and use Dyspnea scale;Understanding of Exercise Prescription;Increase Strength and Stamina;Knowledge and understanding of Target Heart Rate Range (THRR);Able to understand and use rate of perceived exertion (RPE) scale      Comments Pt is to begin exercise on 3/14. Will monitor for progression. Pt has exercised 5 sessions. He is exercising on the arm ergometer for 15 min, level 4, 16 watts, RPM 53. He then  exercises on the recumbent elliptical for 15 min, level 3, METs 3.6. He is tolerating progression well and quickly. Performs warm up and cool down independently without limitations.  Will monitor for progression.      Expected Outcomes Through exercise at rehab and home the patient will decrease shortness of breath with daily activities and feel confident in carrying out an exercise regimn at home Through exercise at rehab and home the patient will decrease shortness of breath with daily activities and feel confident in carrying out an exercise regimn at home               Discharge Exercise Prescription (Final Exercise Prescription Changes):  Exercise Prescription Changes - 07/02/22 1500       Home Exercise Plan   [facility]   Frequency --   n/a   Initial Home Exercises Provided 07/02/22             Nutrition:  Target Goals: Understanding of  nutrition guidelines, daily intake of sodium 1500mg , cholesterol 200mg , calories 30% from fat and 7% or less from saturated fats, daily to have 5 or more servings of fruits and vegetables.  Biometrics:  Pre Biometrics - 05/31/22 0916       Pre Biometrics   Grip Strength 48 kg              Nutrition Therapy Plan and Nutrition Goals:  Nutrition Therapy & Goals - 07/02/22 1442       Nutrition Therapy   Diet Heart Healthy Diet    Drug/Food Interactions Statins/Certain Fruits      Personal Nutrition Goals   Nutrition Goal Patient to improve diet quality by using the plate method as a guide for meal planning to include lean protein/plant protein, fruits, vegetables, whole grains, nonfat dairy as part of a well-balanced diet.    Personal Goal #2 Patient to identify strategies for weight loff of 0.5-2.0# per week.    Comments Goals in action. He reports increased appetite and poor sleep since starting amiodarone. He is motivated to lose weight to 210-200#. He has made an effort to reduce sweets, carbohydrate portions, and juice to aid with weight loss. He has maintained his weight at this time. He will benefit from partication in pulmonary rehab for nutrition, exercise, and lifestyle modification.      Intervention Plan   Intervention Prescribe, educate and counsel regarding individualized specific dietary modifications aiming towards targeted core components such as weight, hypertension, lipid management, diabetes, heart failure and other comorbidities.;Nutrition handout(s) given to patient.    Expected Outcomes Short Term Goal: Understand basic principles of dietary content, such as calories, fat, sodium, cholesterol and nutrients.;Long Term Goal: Adherence to prescribed nutrition plan.             Nutrition Assessments:  MEDIFICTS Score Key: ?70 Need to make dietary changes  40-70 Heart Healthy Diet ? 40 Therapeutic Level Cholesterol Diet   Picture Your Plate Scores: <08<40  Unhealthy dietary pattern with much room for improvement. 41-50 Dietary pattern unlikely to meet recommendations for good health and room for improvement. 51-60 More healthful dietary pattern, with some room for improvement.  >60 Healthy dietary pattern, although there may be some specific behaviors that could be improved.    Nutrition Goals Re-Evaluation:  Nutrition Goals Re-Evaluation     Row Name 06/06/22 1548 07/02/22 1442           Goals   Current Weight 222  lb 3.6 oz (100.8 kg) 220 lb 0.3 oz (99.8 kg)      Comment lipids WNL, A1c WNL No new labs; most recent labs lipids WNL, A1c WNL      Expected Outcome Dan lives at [facility] and eats 1-2 meals daily there; he does have access to a dietitian there. He enjoys a wide variety of foods. He reports increased appetite since starting amiodarone. He is motivated to lose weight to 210-200#. He will benefit from partication in pulmonary rehab for nutrition, exercise, and lifestyle modification. Goals in action. He reports increased appetite and poor sleep since starting amiodarone. He is motivated to lose weight to 210-200#. He has made an effort to reduce sweets, carbohydrate portions, and juice to aid with weight loss. He has maintained his weight at this time. He will benefit from partication in pulmonary rehab for nutrition, exercise, and lifestyle modification.               Nutrition Goals Discharge (Final Nutrition Goals Re-Evaluation):  Nutrition Goals Re-Evaluation - 07/02/22 1442       Goals   Current Weight 220 lb 0.3 oz (99.8 kg)    Comment No new labs; most recent labs lipids WNL, A1c WNL    Expected Outcome Goals in action. He reports increased appetite and poor sleep since starting amiodarone. He is motivated to lose weight to 210-200#. He has made an effort to reduce sweets, carbohydrate portions, and juice to aid with weight loss. He has maintained his weight at this time. He will benefit from partication in pulmonary  rehab for nutrition, exercise, and lifestyle modification.             Psychosocial: Target Goals: Acknowledge presence or absence of significant depression and/or stress, maximize coping skills, provide positive support system. Participant is able to verbalize types and ability to use techniques and skills needed for reducing stress and depression.  Initial Review & Psychosocial Screening:  Initial Psych Review & Screening - 05/31/22 1191       Initial Review   Current issues with Current Anxiety/Panic    Comments Anxious about health and family. Takes meds for anxiety      Family Dynamics   Good Support System? Yes    Comments wife and 2 daughters      Barriers   Psychosocial barriers to participate in program The patient should benefit from training in stress management and relaxation.;There are no identifiable barriers or psychosocial needs.      Screening Interventions   Interventions Encouraged to exercise    Expected Outcomes --             Quality of Life Scores:  Scores of 19 and below usually indicate a poorer quality of life in these areas.  A difference of  2-3 points is a clinically meaningful difference.  A difference of 2-3 points in the total score of the Quality of Life Index has been associated with significant improvement in overall quality of life, self-image, physical symptoms, and general health in studies assessing change in quality of life.  PHQ-9: Review Flowsheet       05/31/2022  Depression screen PHQ 2/9  Decreased Interest 0  Down, Depressed, Hopeless 0  PHQ - 2 Score 0  Altered sleeping 0  Tired, decreased energy 0  Change in appetite 0  Feeling bad or failure about yourself  0  Trouble concentrating 0  Moving slowly or fidgety/restless 0  Suicidal thoughts 0  PHQ-9 Score 0  Difficult doing work/chores Not difficult at all   Interpretation of Total Score  Total Score Depression Severity:  1-4 = Minimal depression, 5-9 = Mild  depression, 10-14 = Moderate depression, 15-19 = Moderately severe depression, 20-27 = Severe depression   Psychosocial Evaluation and Intervention:  Psychosocial Evaluation - 05/31/22 0926       Psychosocial Evaluation & Interventions   Interventions Stress management education;Relaxation education;Encouraged to exercise with the program and follow exercise prescription    Comments Jesusita Oka is anxious about his health and his family. He is on meds for his anxiety.    Expected Outcomes For Dan to participate in rehab free of psychosocial concerns.    Continue Psychosocial Services  Follow up required by staff             Psychosocial Re-Evaluation:  Psychosocial Re-Evaluation     Row Name 05/31/22 1518 07/01/22 1423           Psychosocial Re-Evaluation   Current issues with Current Anxiety/Panic Current Anxiety/Panic      Comments Jesusita Oka is scheduled to start PR on 3/14. We will continue to monitor him. Jesusita Oka endorses that he is not sleeping well due to terrifying nightmares that wake him up in the middle of the night. 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 He stated that since starting amiodarone the nightmares started. He has a planned ablation, but not until July. Jesusita Oka hopes to get off the [facility] after the procedure. RN recommended Jesusita Oka to call his cardiologist about the side effects but he states he will just "deal with it" until July.      Expected Outcomes For Dan to participate in PR free of any psychosocial barriers or concerns For Jesusita Oka to continue participating in PR, get better sleep, and have decreased nightmares      Interventions Encouraged to attend Pulmonary Rehabilitation for the exercise Encouraged to attend Pulmonary Rehabilitation for the exercise      Continue Psychosocial Services  No Follow up required No Follow up required               Psychosocial Discharge (Final Psychosocial Re-Evaluation):  Psychosocial Re-Evaluation - 07/01/22 1423        Psychosocial Re-Evaluation   Current issues with Current Anxiety/Panic    Comments Jesusita Oka endorses that he is not sleeping well due to terrifying nightmares that wake him up in the middle of the night. 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 He stated that since starting amiodarone the nightmares started. He has a planned ablation, but not until July. Jesusita Oka hopes to get off the [facility] after the procedure. RN recommended Jesusita Oka to call his cardiologist about the side effects but he states he will just "deal with it" until July.    Expected Outcomes For Dan to continue participating in PR, get better sleep, and have decreased nightmares    Interventions Encouraged to attend Pulmonary Rehabilitation for the exercise    Continue Psychosocial Services  No Follow up required             Education: Education Goals: Education classes will be provided on a weekly basis, covering required topics. Participant will state understanding/return demonstration of topics presented.  Learning Barriers/Preferences:  Learning Barriers/Preferences - 05/31/22 1610       Learning Barriers/Preferences   Learning Barriers Sight   wears glasses   Learning Preferences Skilled Demonstration  Education Topics: Introduction to Pulmonary Rehab Group instruction provided by PowerPoint, verbal discussion, and written material to support subject matter. Instructor reviews what Pulmonary Rehab is, the purpose of the program, and how patients are referred.     Know Your Numbers Group instruction that is supported by a PowerPoint presentation. Instructor discusses importance of knowing and understanding resting, exercise, and post-exercise oxygen saturation, heart rate, and blood pressure. Oxygen saturation, heart rate, blood pressure, rating of perceived exertion, and dyspnea are reviewed along with a normal range for these values.    Exercise for the Pulmonary Patient Group instruction that is  supported by a PowerPoint presentation. Instructor discusses benefits of exercise, core components of exercise, frequency, duration, and intensity of an exercise routine, importance of utilizing pulse oximetry during exercise, safety while exercising, and options of places to exercise outside of rehab.  Flowsheet Row PULMONARY REHAB OTHER RESPIRATORY from 06/27/2022 in [facility] for Heart, Vascular, & Lung Health  Date 06/27/22  Educator EP  Instruction Review Code 1- Verbalizes Understanding          MET Level  Group instruction provided by PowerPoint, verbal discussion, and written material to support subject matter. Instructor reviews what METs are and how to increase METs.    Pulmonary Medications Verbally interactive group education provided by instructor with focus on inhaled medications and proper administration. Flowsheet Row PULMONARY REHAB OTHER RESPIRATORY from 06/20/2022 in [facility] for Heart, Vascular, & Lung Health  Date 06/20/22  Educator RT  Instruction Review Code 1- Verbalizes Understanding       Anatomy and Physiology of the Respiratory System Group instruction provided by PowerPoint, verbal discussion, and written material to support subject matter. Instructor reviews respiratory cycle and anatomical components of the respiratory system and their functions. Instructor also reviews differences in obstructive and restrictive respiratory diseases with examples of each.    Oxygen Safety Group instruction provided by PowerPoint, verbal discussion, and written material to support subject matter. There is an overview of "What is Oxygen" and "Why do we need it".  Instructor also reviews how to create a safe environment for oxygen use, the importance of using oxygen as prescribed, and the risks of noncompliance. There is a brief discussion on traveling with oxygen and resources the patient may utilize.   Oxygen Use Group  instruction provided by PowerPoint, verbal discussion, and written material to discuss how supplemental oxygen is prescribed and different types of oxygen supply systems. Resources for more information are provided.    Breathing Techniques Group instruction that is supported by demonstration and informational handouts. Instructor discusses the benefits of pursed lip and diaphragmatic breathing and detailed demonstration on how to perform both.     Risk Factor Reduction Group instruction that is supported by a PowerPoint presentation. Instructor discusses the definition of a risk factor, different risk factors for pulmonary disease, and how the heart and lungs work together.   MD Day A group question and answer session with a medical doctor that allows participants to ask questions that relate to their pulmonary disease state.   Nutrition for the Pulmonary Patient Group instruction provided by PowerPoint slides, verbal discussion, and written materials to support subject matter. The instructor gives an explanation and review of healthy diet recommendations, which includes a discussion on weight management, recommendations for fruit and vegetable consumption, as well as protein, fluid, caffeine, fiber, sodium, sugar, and alcohol. Tips for eating when patients are short of breath are discussed.  Other Education Group or individual verbal, written, or video instructions that support the educational goals of the pulmonary rehab program.    Knowledge Questionnaire Score:  Knowledge Questionnaire Score - 05/31/22 1047       Knowledge Questionnaire Score   Pre Score 15/18             Core Components/Risk Factors/Patient Goals at Admission:  Personal Goals and Risk Factors at Admission - 05/31/22 0928       Core Components/Risk Factors/Patient Goals on Admission    Weight Management Yes;Weight Loss    Intervention Weight Management: Develop a combined nutrition and exercise program  designed to reach desired caloric intake, while maintaining appropriate intake of nutrient and fiber, sodium and fats, and appropriate energy expenditure required for the weight goal.;Weight Management: Provide education and appropriate resources to help participant work on and attain dietary goals.;Weight Management/Obesity: Establish reasonable short term and long term weight goals.    Expected Outcomes Short Term: Continue to assess and modify interventions until short term weight is achieved;Long Term: Adherence to nutrition and physical activity/exercise program aimed toward attainment of established weight goal;Weight Maintenance: Understanding of the daily nutrition guidelines, which includes 25-35% calories from fat, 7% or less cal from saturated fats, less than 200mg  cholesterol, less than 1.5gm of sodium, & 5 or more servings of fruits and vegetables daily;Weight Loss: Understanding of general recommendations for a balanced deficit meal plan, which promotes 1-2 lb weight loss per week and includes a negative energy balance of [id number] kcal/d;Understanding recommendations for meals to include 15-35% energy as protein, 25-35% energy from fat, 35-60% energy from carbohydrates, less than 200mg  of dietary cholesterol, 20-35 gm of total fiber daily;Understanding of distribution of calorie intake throughout the day with the consumption of 4-5 meals/snacks    Improve shortness of breath with ADL's Yes    Intervention Provide education, individualized exercise plan and daily activity instruction to help decrease symptoms of SOB with activities of daily living.    Expected Outcomes Short Term: Improve cardiorespiratory fitness to achieve a reduction of symptoms when performing ADLs;Long Term: Be able to perform more ADLs without symptoms or delay the onset of symptoms             Core Components/Risk Factors/Patient Goals Review:   Goals and Risk Factor Review     Row Name 05/31/22 1520 07/01/22 1428            Core Components/Risk Factors/Patient Goals Review   Personal Goals Review Improve shortness of breath with ADL's;Develop more efficient breathing techniques such as purse lipped breathing and diaphragmatic breathing and practicing self-pacing with activity.;Weight Management/Obesity Improve shortness of breath with ADL's;Develop more efficient breathing techniques such as purse lipped breathing and diaphragmatic breathing and practicing self-pacing with activity.;Weight Management/Obesity      Review Jesusita Oka is scheduled to start PR on 3/14 Jesusita Oka stated that after moving into a retirement facility his weight has gone up. He stated that he gets his meals served to him and it's always 4 courses. He said his wife and him also like to social with friends with drinks and snacks. Jesusita Oka made a goal of weight loss but has maintained since starting. He is working with our dietitian on achieving weight loss. He has decreased his shortness of breath and can use pursed lip breathing without being prompted. Jesusita Oka has completed 7 classes so far. He is exercising on the arm egrometer and the seated elliptical. He is making great improvements on both machines increasing his  workload and METs. Jesusita Oka likes coming to class and speaking with other veterans.      Expected Outcomes See admission goals For Dan to lose weight, improve his shortness of breath, develop more efficient breathing techniques, and any other goals set during orientation.               Core Components/Risk Factors/Patient Goals at Discharge (Final Review):   Goals and Risk Factor Review - 07/01/22 1428       Core Components/Risk Factors/Patient Goals Review   Personal Goals Review Improve shortness of breath with ADL's;Develop more efficient breathing techniques such as purse lipped breathing and diaphragmatic breathing and practicing self-pacing with activity.;Weight Management/Obesity    Review Jesusita Oka stated that after moving into a retirement  facility his weight has gone up. He stated that he gets his meals served to him and it's always 4 courses. He said his wife and him also like to social with friends with drinks and snacks. Jesusita Oka made a goal of weight loss but has maintained since starting. He is working with our dietitian on achieving weight loss. He has decreased his shortness of breath and can use pursed lip breathing without being prompted. Jesusita Oka has completed 7 classes so far. He is exercising on the arm egrometer and the seated elliptical. He is making great improvements on both machines increasing his workload and METs. Jesusita Oka likes coming to class and speaking with other veterans.    Expected Outcomes For Dan to lose weight, improve his shortness of breath, develop more efficient breathing techniques, and any other goals set during orientation.             ITP Comments:Pt is making expected progress toward Pulmonary Rehab goals after completing 8 sessions. Recommend continued exercise, life style modification, education, and utilization of breathing techniques to increase stamina and strength, while also decreasing shortness of breath with exertion.  Dr. Mechele Collin is Medical Director for Pulmonary Rehab at [facility].

## 2022-07-04 ENCOUNTER — Encounter (HOSPITAL_COMMUNITY)
Admission: RE | Admit: 2022-07-04 | Discharge: 2022-07-04 | Disposition: A | Discharge status: Home / Self Care | Payer: Medicare Other | Source: Ambulatory Visit | Attending: Cardiology | Admitting: Cardiology

## 2022-07-04 DIAGNOSIS — I5022 Chronic systolic (congestive) heart failure: Secondary | ICD-10-CM

## 2022-07-04 NOTE — Progress Notes (Signed)
Daily Session Note  Patient Details  Name: Sean Stanley MRN: [id number] Date of Birth: 1940-11-15 Referring Provider:   Doristine Devoid Pulmonary Rehab Walk Test from 05/31/2022 in [facility] for Heart, Vascular, & Lung Health  Referring Provider Shari Prows       Encounter Date: 07/04/2022  Check In:  Session Check In - 07/04/22 1451       Check-In   Supervising physician immediately available to respond to emergencies [facility] MD immediately available    Physician(s) Carlyon Shadow, NP    Location [facility]    Staff Present Samantha Belarus, RD, Dutch Gray, RN, BSN;Randi Reeve BS, ACSM-CEP, Exercise Physiologist;Jetta Dan Humphreys BS, ACSM-CEP, Exercise Physiologist;Kaylee Earlene Plater, MS, ACSM-CEP, Exercise Physiologist;Casey Katrinka Blazing, RT    Virtual Visit No    Medication changes reported     No    Fall or balance concerns reported    No    Tobacco Cessation No Change    Warm-up and Cool-down Performed as group-led instruction    Resistance Training Performed Yes    VAD Patient? No    PAD/SET Patient? No      Pain Assessment   Currently in Pain? No/denies    Multiple Pain Sites No             Capillary Blood Glucose: No results found for this or any previous visit (from the past 24 hour(s)).    Social History   Tobacco Use  Smoking Status Never  Smokeless Tobacco Never  Tobacco Comments   Never smoke 04/10/22    Goals Met:  Independence with exercise equipment Exercise tolerated well No report of concerns or symptoms today Strength training completed today  Goals Unmet:  Not Applicable  Comments: Service time is from 1315 to 1508    Dr. Mechele Collin is Medical Director for Pulmonary Rehab at [facility].

## 2022-07-09 ENCOUNTER — Other Ambulatory Visit (HOSPITAL_COMMUNITY): Payer: Self-pay | Admitting: Physician Assistant

## 2022-07-09 ENCOUNTER — Encounter (HOSPITAL_COMMUNITY)
Admission: RE | Admit: 2022-07-09 | Discharge: 2022-07-09 | Disposition: A | Discharge status: Home / Self Care | Payer: Medicare Other | Source: Ambulatory Visit | Attending: Cardiology | Admitting: Cardiology

## 2022-07-09 VITALS — Wt 217.4 lb

## 2022-07-09 DIAGNOSIS — I5022 Chronic systolic (congestive) heart failure: Secondary | ICD-10-CM

## 2022-07-09 NOTE — Progress Notes (Signed)
Daily Session Note  Patient Details  Name: Sean Stanley MRN: [id number] Date of Birth: 1941/02/07 Referring Provider:   Doristine Devoid Pulmonary Rehab Walk Test from 05/31/2022 in [facility] for Heart, Vascular, & Lung Health  Referring Provider Shari Prows       Encounter Date: 07/09/2022  Check In:  Session Check In - 07/09/22 1544       Check-In   Supervising physician immediately available to respond to emergencies [facility] MD immediately available    Physician(s) Jari Favre PA    Location [facility]    Staff Present Essie Hart, RN, BSN;Dama Hedgepeth Idelle Crouch BS, ACSM-CEP, Exercise Physiologist;Kaylee Earlene Plater, MS, ACSM-CEP, Exercise Physiologist;Casey Sonnie Alamo, MS, ACSM-CEP, Exercise Physiologist    Virtual Visit No    Medication changes reported     No    Fall or balance concerns reported    No    Tobacco Cessation No Change    Warm-up and Cool-down Performed as group-led instruction    Resistance Training Performed Yes    VAD Patient? No    PAD/SET Patient? No      Pain Assessment   Currently in Pain? No/denies             Capillary Blood Glucose: No results found for this or any previous visit (from the past 24 hour(s)).   Exercise Prescription Changes - 07/09/22 1500       Response to Exercise   Blood Pressure (Admit) 108/60    Blood Pressure (Exercise) 120/60    Blood Pressure (Exit) 108/60    Heart Rate (Admit) 53 bpm    Heart Rate (Exercise) 64 bpm    Heart Rate (Exit) 61 bpm    Oxygen Saturation (Admit) 96 %    Oxygen Saturation (Exercise) 95 %    Oxygen Saturation (Exit) 96 %    Rating of Perceived Exertion (Exercise) 11    Perceived Dyspnea (Exercise) 0    Duration Continue with 30 min of aerobic exercise without signs/symptoms of physical distress.    Intensity THRR unchanged      Progression   Progression Continue to progress workloads to maintain intensity without signs/symptoms of physical  distress.      Resistance Training   Training Prescription Yes    Weight black bands    Reps 10-15    Time 10 Minutes      Arm Ergometer   Level 5    Watts 19    RPM 80    Minutes 15      Recumbant Elliptical   Level 6    RPM 60    Minutes 15    METs 3.1             Social History   Tobacco Use  Smoking Status Never  Smokeless Tobacco Never  Tobacco Comments   Never smoke 04/10/22    Goals Met:  Independence with exercise equipment Exercise tolerated well No report of concerns or symptoms today Strength training completed today  Goals Unmet:  Not Applicable  Comments: Service time is from 1323 to 1450.    Dr. Mechele Collin is Medical Director for Pulmonary Rehab at [facility].

## 2022-07-11 ENCOUNTER — Encounter (HOSPITAL_COMMUNITY)
Admission: RE | Admit: 2022-07-11 | Discharge: 2022-07-11 | Disposition: A | Discharge status: Home / Self Care | Payer: Medicare Other | Source: Ambulatory Visit | Attending: Cardiology | Admitting: Cardiology

## 2022-07-11 DIAGNOSIS — I5022 Chronic systolic (congestive) heart failure: Secondary | ICD-10-CM

## 2022-07-11 NOTE — Progress Notes (Signed)
Daily Session Note  Patient Details  Name: Sean Stanley MRN: [id number] Date of Birth: [birth year] Referring Provider:   Doristine Devoid Pulmonary Rehab Walk Test from 05/31/2022 in [facility] for Heart, Vascular, & Lung Health  Referring Provider Shari Prows       Encounter Date: 07/11/2022  Check In:  Session Check In - 07/11/22 1445       Check-In   Supervising physician immediately available to respond to emergencies [facility] MD immediately available    Physician(s) Eligha Bridegroom, NP    Location [facility]    Staff Present Samantha Belarus, RD, Dutch Gray, RN, Doris Cheadle, MS, ACSM-CEP, Exercise Physiologist;Randi Dionisio Paschal, ACSM-CEP, Exercise Physiologist;Casey Katrinka Blazing, RT    Virtual Visit No    Medication changes reported     No    Fall or balance concerns reported    No    Tobacco Cessation No Change    Warm-up and Cool-down Performed as group-led instruction    Resistance Training Performed Yes    VAD Patient? No    PAD/SET Patient? No      Pain Assessment   Currently in Pain? No/denies    Multiple Pain Sites No             Capillary Blood Glucose: No results found for this or any previous visit (from the past 24 hour(s)).    Social History   Tobacco Use  Smoking Status Never  Smokeless Tobacco Never  Tobacco Comments   Never smoke 04/10/22    Goals Met:  Proper associated with RPD/PD & O2 Sat Exercise tolerated well No report of concerns or symptoms today Strength training completed today  Goals Unmet:  Not Applicable  Comments: Service time is from 1315 to 1455.    Dr. Mechele Collin is Medical Director for Pulmonary Rehab at [facility].

## 2022-07-14 ENCOUNTER — Encounter: Payer: Self-pay | Admitting: Cardiology

## 2022-07-16 ENCOUNTER — Encounter (HOSPITAL_COMMUNITY): Payer: Medicare Other

## 2022-07-18 ENCOUNTER — Encounter (HOSPITAL_COMMUNITY): Payer: Medicare Other

## 2022-07-23 ENCOUNTER — Encounter (HOSPITAL_COMMUNITY)
Admission: RE | Admit: 2022-07-23 | Discharge: 2022-07-23 | Disposition: A | Discharge status: Home / Self Care | Payer: Medicare Other | Source: Ambulatory Visit | Attending: Cardiology | Admitting: Cardiology

## 2022-07-23 VITALS — Wt 219.4 lb

## 2022-07-23 DIAGNOSIS — I5022 Chronic systolic (congestive) heart failure: Secondary | ICD-10-CM | POA: Diagnosis not present

## 2022-07-23 NOTE — Progress Notes (Signed)
Daily Session Note  Patient Details  Name: Sean Stanley MRN: [id number] Date of Birth: [birth year] Referring Provider:   Doristine Devoid Pulmonary Rehab Walk Test from 05/31/2022 in [facility]  Referring Provider Shari Prows       Encounter Date: 07/23/2022  Check In:  Session Check In - 07/23/22 1438       Check-In   Supervising physician immediately available to respond to emergencies [facility] MD immediately available    Physician(s) Robin Searing, NP    Location [facility]    Staff Present Samantha Belarus, RD, Dutch Gray, RN, Doris Cheadle, MS, ACSM-CEP, Exercise Physiologist;Randi Dionisio Paschal, ACSM-CEP, Exercise Physiologist;Casey Sonnie Alamo, MS, ACSM-CEP, Exercise Physiologist    Virtual Visit No    Medication changes reported     No    Fall or balance concerns reported    No    Tobacco Cessation No Change    Warm-up and Cool-down Performed as group-led instruction    Resistance Training Performed Yes    VAD Patient? No    PAD/SET Patient? No      Pain Assessment   Currently in Pain? No/denies    Multiple Pain Sites No             Capillary Blood Glucose: No results found for this or any previous visit (from the past 24 hour(s)).   Exercise Prescription Changes - 07/23/22 1500       Response to Exercise   Blood Pressure (Admit) 108/58    Blood Pressure (Exercise) 112/60    Blood Pressure (Exit) 116/62    Heart Rate (Admit) 54 bpm    Heart Rate (Exercise) 60 bpm    Heart Rate (Exit) 51 bpm    Oxygen Saturation (Admit) 96 %    Oxygen Saturation (Exercise) 95 %    Oxygen Saturation (Exit) 98 %    Rating of Perceived Exertion (Exercise) 11    Perceived Dyspnea (Exercise) 1    Duration Continue with 30 min of aerobic exercise without signs/symptoms of physical distress.    Intensity THRR unchanged      Progression   Progression Continue to progress workloads to maintain  intensity without signs/symptoms of physical distress.      Resistance Training   Training Prescription Yes    Weight black bands    Reps 10-15    Time 10 Minutes      Arm Ergometer   Level 5    Watts 18    Minutes 15      Recumbant Elliptical   Level 7    RPM 60    Minutes 15    METs 3.9             Social History   Tobacco Use  Smoking Status Never  Smokeless Tobacco Never  Tobacco Comments   Never smoke 04/10/22    Goals Met:  Independence with exercise equipment Improved SOB with ADL's Exercise tolerated well No report of concerns or symptoms today Strength training completed today  Goals Unmet:  Not Applicable  Comments: Service time is from 1313 to 1455    Dr. Mechele Collin is Medical Director for Pulmonary Rehab at [facility].

## 2022-07-25 ENCOUNTER — Encounter (HOSPITAL_COMMUNITY)
Admission: RE | Admit: 2022-07-25 | Discharge: 2022-07-25 | Disposition: A | Discharge status: Home / Self Care | Payer: Medicare Other | Source: Ambulatory Visit | Attending: Cardiology | Admitting: Cardiology

## 2022-07-25 DIAGNOSIS — I5022 Chronic systolic (congestive) heart failure: Secondary | ICD-10-CM | POA: Insufficient documentation

## 2022-07-25 NOTE — Progress Notes (Signed)
Daily Session Note  Patient Details  Name: Sean Stanley MRN: [id number] Date of Birth: [birth year] Referring Provider:   Doristine Devoid Pulmonary Rehab Walk Test from 05/31/2022 in [facility]  Referring Provider Shari Prows       Encounter Date: 07/25/2022  Check In:  Session Check In - 07/25/22 1456       Check-In   Supervising physician immediately available to respond to emergencies [facility] immediately available    Physician(s) Edd Fabian, NP    Location [facility]    Staff Present Samantha Belarus, RD, Dutch Gray, RN, Doris Cheadle, MS, ACSM-CEP, Exercise Physiologist;Randi Dionisio Paschal, ACSM-CEP, Exercise Physiologist;Casey Heloise Ochoa, MS, ACSM-CEP, CCRP, Exercise Physiologist    Virtual Visit No    Medication changes reported     No    Fall or balance concerns reported    No    Tobacco Cessation No Change    Warm-up and Cool-down Performed as group-led instruction    Resistance Training Performed Yes    VAD Patient? No    PAD/SET Patient? No      Pain Assessment   Currently in Pain? No/denies    Multiple Pain Sites No             Capillary Blood Glucose: No results found for this or any previous visit (from the past 24 hour(s)).    Social History   Tobacco Use  Smoking Status Never  Smokeless Tobacco Never  Tobacco Comments   Never smoke 04/10/22    Goals Met:  Independence with exercise equipment Improved SOB with ADL's Exercise tolerated well No report of concerns or symptoms today Strength training completed today  Goals Unmet:  Not Applicable  Comments: Service time is from 1322 to 1508    Dr. Mechele Collin is Medical Director for Pulmonary Rehab at [facility].

## 2022-07-28 LAB — CUP PACEART REMOTE DEVICE CHECK
Date Time Interrogation Session: 20240503230415
Implantable Pulse Generator Implant Date: 20230105

## 2022-07-29 ENCOUNTER — Ambulatory Visit (INDEPENDENT_AMBULATORY_CARE_PROVIDER_SITE_OTHER): Payer: Medicare Other

## 2022-07-29 DIAGNOSIS — I480 Paroxysmal atrial fibrillation: Secondary | ICD-10-CM

## 2022-07-30 ENCOUNTER — Encounter (HOSPITAL_COMMUNITY)
Admission: RE | Admit: 2022-07-30 | Discharge: 2022-07-30 | Disposition: A | Discharge status: Home / Self Care | Payer: Medicare Other | Source: Ambulatory Visit | Attending: Cardiology | Admitting: Cardiology

## 2022-07-30 DIAGNOSIS — I5022 Chronic systolic (congestive) heart failure: Secondary | ICD-10-CM

## 2022-07-30 NOTE — Progress Notes (Signed)
[facility] Summary Report / Loop Recorder 

## 2022-07-30 NOTE — Progress Notes (Signed)
Daily Session Note  Patient Details  Name: Sean Stanley MRN: [id number] Date of Birth: Feb 13, 1941 Referring Provider:   Doristine Devoid Pulmonary Rehab Walk Test from 05/31/2022 in [facility]  Referring Provider Shari Prows       Encounter Date: 07/30/2022  Check In:  Session Check In - 07/30/22 1440       Check-In   Supervising physician immediately available to respond to emergencies [facility] immediately available    Physician(s) Carlos Levering, NP    [facility]    Staff Present Samantha Belarus, RD, Dutch Gray, RN, Doris Cheadle, MS, ACSM-CEP, Exercise Physiologist;Randi Dionisio Paschal, ACSM-CEP, Exercise Physiologist;Casey Sonnie Alamo, MS, ACSM-CEP, Exercise Physiologist;Carlette Les Pou, RN, BSN    Virtual Visit No    Medication changes reported     No    Fall or balance concerns reported    No    Tobacco Cessation No Change    Warm-up and Cool-down Performed as group-led instruction    Resistance Training Performed Yes    VAD Patient? No    PAD/SET Patient? No      Pain Assessment   Currently in Pain? No/denies    Multiple Pain Sites No             Capillary Blood Glucose: No results found for this or any previous visit (from the past 24 hour(s)).    Social History   Tobacco Use  Smoking Status Never  Smokeless Tobacco Never  Tobacco Comments   Never smoke 04/10/22    Goals Met:  Proper associated with RPD/PD & O2 Sat Independence with exercise equipment Exercise tolerated well No report of concerns or symptoms today Strength training completed today  Goals Unmet:  Not Applicable  Comments: Service time is from 1313 to 1458.    Dr. Mechele Collin is Medical Director for Pulmonary Rehab at [facility]

## 2022-07-31 DIAGNOSIS — M25561 Pain in right knee: Secondary | ICD-10-CM | POA: Diagnosis not present

## 2022-08-01 ENCOUNTER — Encounter (HOSPITAL_COMMUNITY)
Admission: RE | Admit: 2022-08-01 | Discharge: 2022-08-01 | Disposition: A | Discharge status: Home / Self Care | Payer: Medicare Other | Source: Ambulatory Visit | Attending: Cardiology | Admitting: Cardiology

## 2022-08-01 DIAGNOSIS — I5022 Chronic systolic (congestive) heart failure: Secondary | ICD-10-CM | POA: Diagnosis not present

## 2022-08-01 NOTE — Progress Notes (Signed)
Daily Session Note  Patient Details  Name: Sean Stanley MRN: [id number] Date of Birth: [birth year] Referring Provider:   Doristine Devoid Pulmonary Rehab Walk Test from 05/31/2022 in [facility]  Referring Provider Shari Prows       Encounter Date: 08/01/2022  Check In:  Session Check In - 08/01/22 1433       Check-In   Supervising physician immediately available to respond to emergencies [facility] immediately available    Physician(s) Jari Favre, PA    Location [facility]    Staff Present Essie Hart, RN, Doris Cheadle, MS, ACSM-CEP, Exercise Physiologist;Randi Dionisio Paschal, ACSM-CEP, Exercise Physiologist;Casey Glenetta Borg, MS, Exercise Physiologist;David Manus Gunning, MS, ACSM-CEP, CCRP, Exercise Physiologist    Virtual Visit No    Medication changes reported     No    Fall or balance concerns reported    No    Tobacco Cessation No Change    Warm-up and Cool-down Performed as group-led instruction    Resistance Training Performed Yes    VAD Patient? No    PAD/SET Patient? No      Pain Assessment   Currently in Pain? No/denies    Multiple Pain Sites No             Capillary Blood Glucose: No results found for this or any previous visit (from the past 24 hour(s)).    Social History   Tobacco Use  Smoking Status Never  Smokeless Tobacco Never  Tobacco Comments   Never smoke 04/10/22    Goals Met:  Independence with exercise equipment Improved SOB with ADL's Exercise tolerated well No report of concerns or symptoms today Strength training completed today  Goals Unmet:  Not Applicable  Comments: Service time is from 1306 to 1450    Dr. Mechele Collin is Medical Director for Pulmonary Rehab at [facility].

## 2022-08-05 DIAGNOSIS — R351 Nocturia: Secondary | ICD-10-CM | POA: Diagnosis not present

## 2022-08-05 DIAGNOSIS — R972 Elevated prostate specific antigen [PSA]: Secondary | ICD-10-CM | POA: Diagnosis not present

## 2022-08-05 DIAGNOSIS — N401 Enlarged prostate with lower urinary tract symptoms: Secondary | ICD-10-CM | POA: Diagnosis not present

## 2022-08-06 ENCOUNTER — Encounter (HOSPITAL_COMMUNITY)
Admission: RE | Admit: 2022-08-06 | Discharge: 2022-08-06 | Disposition: A | Discharge status: Home / Self Care | Payer: Medicare Other | Source: Ambulatory Visit | Attending: Cardiology | Admitting: Cardiology

## 2022-08-06 VITALS — Wt 215.4 lb

## 2022-08-06 DIAGNOSIS — I5022 Chronic systolic (congestive) heart failure: Secondary | ICD-10-CM

## 2022-08-06 NOTE — Progress Notes (Signed)
Daily Session Note  Patient Details  Name: Sean Stanley MRN: [id number] Date of Birth: April 10, 1940 Referring Provider:   Doristine Devoid Pulmonary Rehab Walk Test from 05/31/2022 in [facility]  Referring Provider Shari Prows       Encounter Date: 08/06/2022  Check In:  Session Check In - 08/06/22 1337       Check-In   Supervising physician immediately available to respond to emergencies [facility] immediately available    Physician(s) Jari Favre, PA    Location [facility]    Staff Present Essie Hart, RN, Doris Cheadle, MS, ACSM-CEP, Exercise Physiologist;Randi Dionisio Paschal, ACSM-CEP, Exercise Physiologist;Charm Stenner Heloise Ochoa, MS, ACSM-CEP, CCRP, Exercise Physiologist    Virtual Visit No    Medication changes reported     No    Fall or balance concerns reported    No    Tobacco Cessation No Change    Warm-up and Cool-down Performed as group-led instruction    Resistance Training Performed Yes    VAD Patient? No    PAD/SET Patient? No      Pain Assessment   Currently in Pain? No/denies    Multiple Pain Sites No             Capillary Blood Glucose: No results found for this or any previous visit (from the past 24 hour(s)).   Exercise Prescription Changes - 08/06/22 1500       Response to Exercise   Blood Pressure (Admit) 120/64    Blood Pressure (Exercise) 140/70    Blood Pressure (Exit) 120/64    Heart Rate (Admit) 56 bpm    Heart Rate (Exercise) 65 bpm    Heart Rate (Exit) 53 bpm    Oxygen Saturation (Admit) 98 %    Oxygen Saturation (Exercise) 97 %    Oxygen Saturation (Exit) 96 %    Rating of Perceived Exertion (Exercise) 9    Perceived Dyspnea (Exercise) 0    Duration Continue with 30 min of aerobic exercise without signs/symptoms of physical distress.    Intensity THRR unchanged      Progression   Progression Continue to progress workloads to maintain intensity without  signs/symptoms of physical distress.      Resistance Training   Training Prescription Yes    Weight black bands    Reps 10-15    Time 10 Minutes      Arm Ergometer   Level 6    Watts 22    Minutes 15      Track   Laps 14    Minutes 15    METs 3.15             Social History   Tobacco Use  Smoking Status Never  Smokeless Tobacco Never  Tobacco Comments   Never smoke 04/10/22    Goals Met:  Proper associated with RPD/PD & O2 Sat Independence with exercise equipment Exercise tolerated well No report of concerns or symptoms today Strength training completed today  Goals Unmet:  Not Applicable  Comments: Service time is from 1315 to 1450.    Dr. Mechele Collin is Medical Director for Pulmonary Rehab at [facility].

## 2022-08-07 IMAGING — CT CT ABD-PELV W/ CM
2 of 5 series · 13 of 36 positions shown, 16 images · IV contrast (omnipaque)
Comparison: 05/24/2019 PET-CT.

CLINICAL DATA: Mantle cell lymphoma status post chemotherapy.
Thyroid cancer status post thyroidectomy and radioiodine therapy.
Restaging.

EXAM:
CT CHEST, ABDOMEN, AND PELVIS WITH CONTRAST
TECHNIQUE: Multidetector CT imaging of the chest, abdomen and pelvis was
performed following the standard protocol during bolus
administration of intravenous contrast.
CONTRAST:  100mL OMNIPAQUE IOHEXOL 300 MG/ML  SOLN

[Series 2: cap with · axial · 0.97mm/px · z∈[-684,-154]mm · 10 of 130 slices shown, 13 images]
[im 12/130  mediastinal]
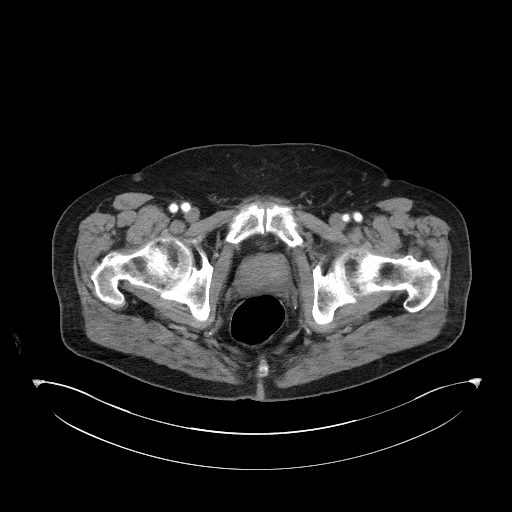
[im 12/130  lung]
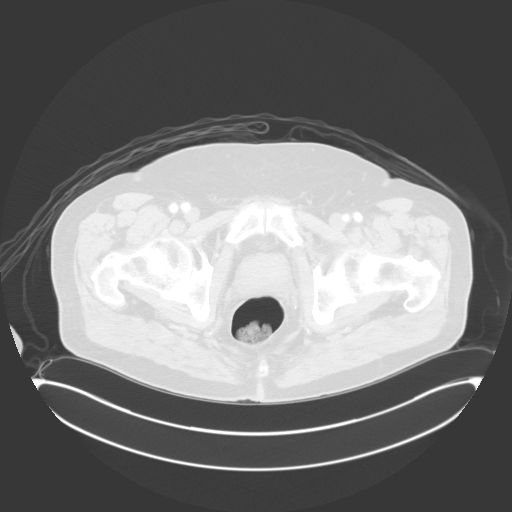
[im 24/130  lung]
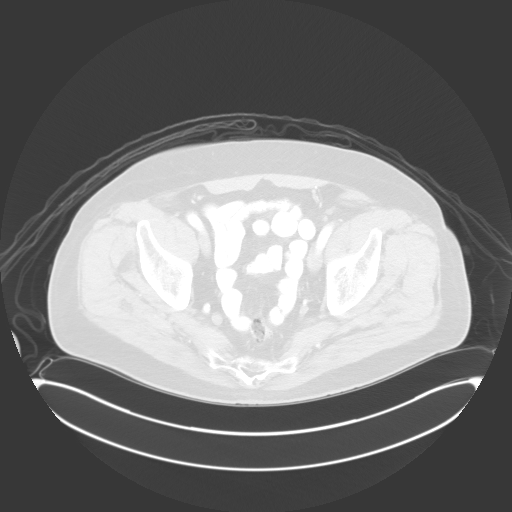
[im 36/130  lung]
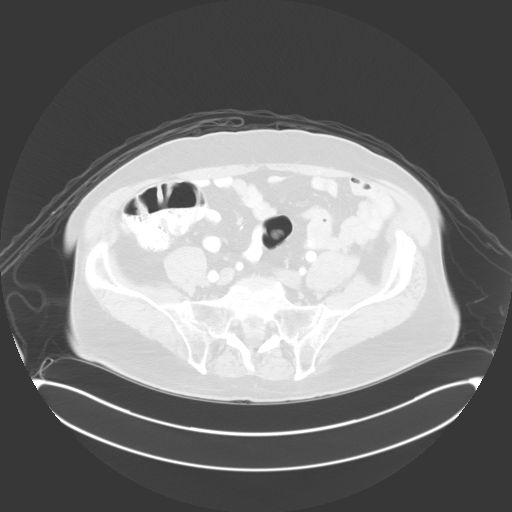
[im 47/130  lung]
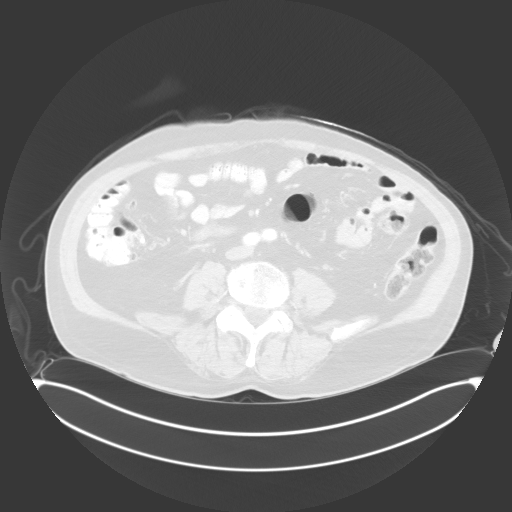
[im 59/130  mediastinal]
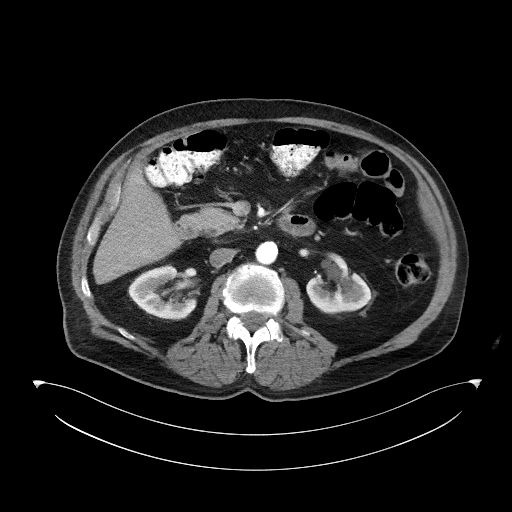
[im 59/130  lung]
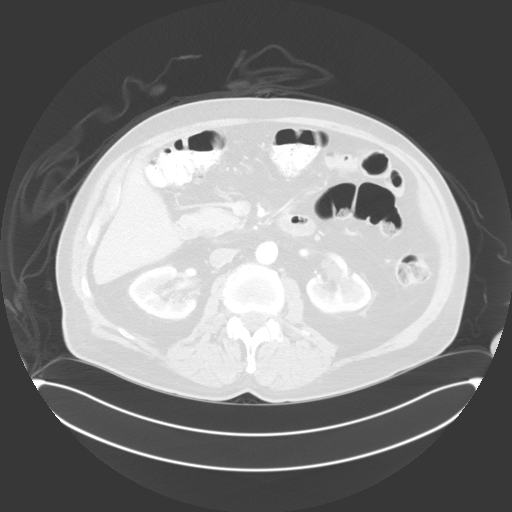
[im 71/130  lung]
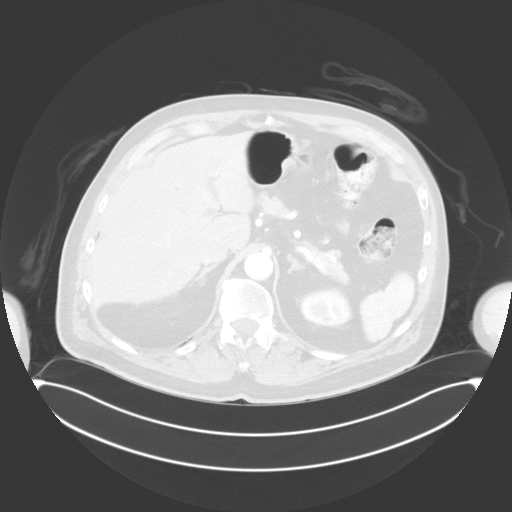
[im 83/130  lung]
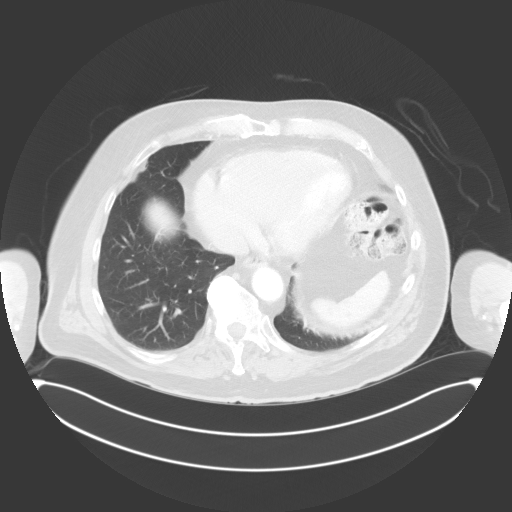
[im 94/130  lung]
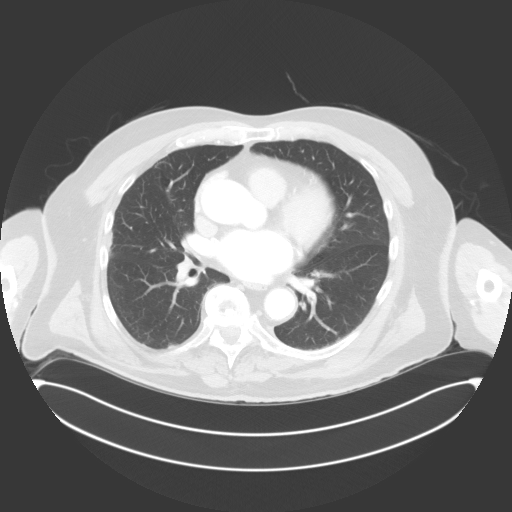
[im 106/130  mediastinal]
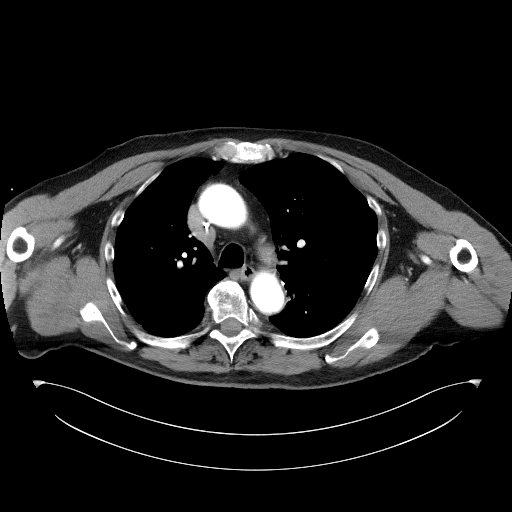
[im 106/130  lung]
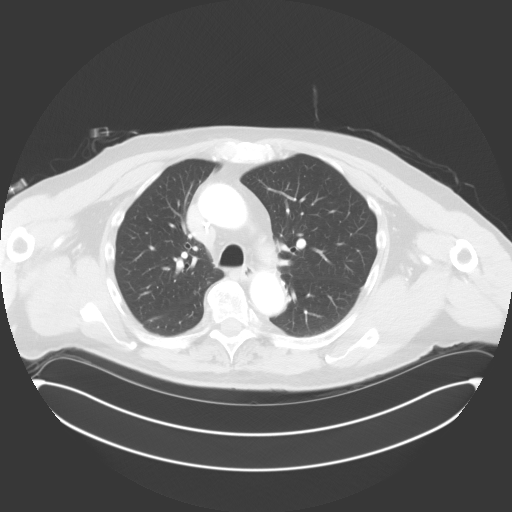
[im 118/130  lung]
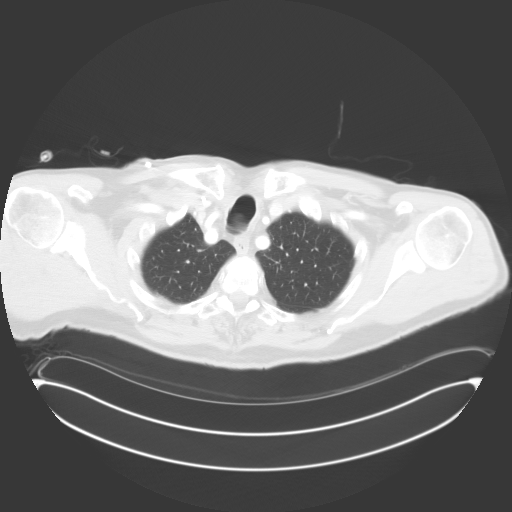

[Series 4: coronals · coronal · 1.20mm/px · 3 of 147 slices shown]
[im 30/147  lung]
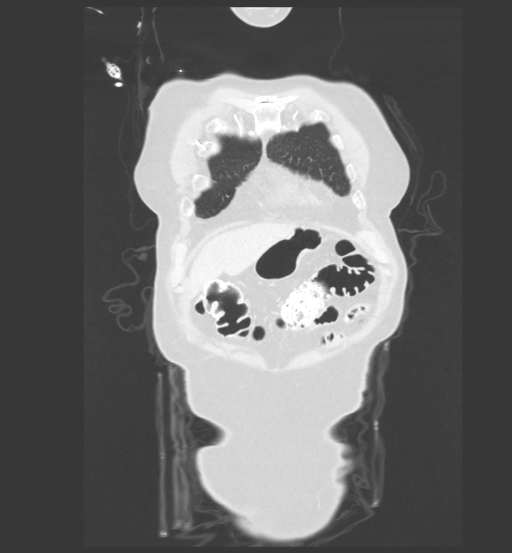
[im 59/147  lung]
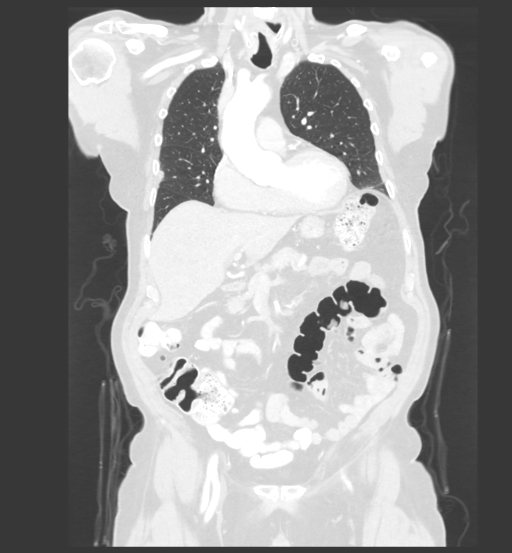
[im 88/147  lung]
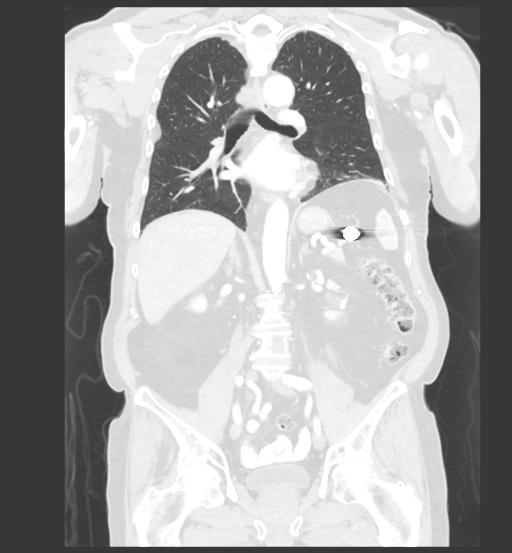

[13 of 36 positions shown; findings below may reference images not displayed]

FINDINGS: CT CHEST FINDINGS

Cardiovascular: Borderline mild cardiomegaly. No significant
pericardial effusion/thickening. Right internal jugular Port-A-Cath
terminates in the lower third of the SVC. Atherosclerotic thoracic
aorta with ectatic 4.4 cm ascending thoracic aorta. Top-normal
caliber main pulmonary artery (3.0 cm diameter). No central
pulmonary emboli.

Mediastinum/Nodes: Total thyroidectomy. Unremarkable esophagus. No
pathologically enlarged axillary, mediastinal or hilar lymph nodes.

Lungs/Pleura: No pneumothorax. No pleural effusion. No acute
consolidative airspace disease, lung masses or significant pulmonary
nodules. Chronic parenchymal banding at the left greater than right
lung bases is unchanged.

Musculoskeletal: No aggressive appearing focal osseous lesions.
Marked thoracic spondylosis.

CT ABDOMEN PELVIS FINDINGS

Hepatobiliary: Normal liver size. Subcentimeter hypervascular focus
at the posterior right liver dome (series 2/image 53) is stable
since 03/06/2013 CT angiogram and considered benign. Indistinct
hypervascular subcapsular focus at the left liver dome (series
2/image 52), more discrete on today's scan although present on
03/06/2013 CT angiogram study, favoring transient benign vascular
focus. No liver masses. Cholecystectomy. Bile ducts are within
normal post cholecystectomy limits with CBD diameter 7 mm.

Pancreas: Normal, with no mass or duct dilation.

Spleen: Normal size. No mass.

Adrenals/Urinary Tract: Normal adrenals. Simple 1.5 cm upper left
renal cyst. Small parapelvic renal cysts in the left greater than
right kidneys. No hydronephrosis. Normal bladder.

Stomach/Bowel: Normal non-distended stomach. Chronic hypodense 3.0 x
1.4 cm collection posterior to the transverse duodenum (series
2/image 80), substantially reduced from 8.2 x 5.4 cm on 03/06/2013
CT angiogram study and slightly decreased from 3.4 x 1.5 cm on
05/24/2019 PET-CT. Normal caliber small bowel with no small bowel
wall thickening. Normal appendix. Oral contrast transits to the
colon. Moderate diffuse colonic diverticulosis with no large bowel
wall thickening or significant pericolonic fat stranding.

Vascular/Lymphatic: Atherosclerotic nonaneurysmal abdominal aorta.
Patent portal, splenic and renal veins. No pathologically enlarged
lymph nodes in the abdomen or pelvis.

Reproductive: Mild prostatomegaly.

Other: No pneumoperitoneum, ascites or focal fluid collection.

Musculoskeletal: No aggressive appearing focal osseous lesions.
Moderate lumbar spondylosis.
IMPRESSION: 1. No evidence of recurrent lymphoma in the chest, abdomen or
pelvis. Normal size spleen.
2. Chronic findings include: Ectatic 4.4 cm ascending thoracic
aorta. Recommend annual imaging followup by CTA or MRA. This
recommendation follows 0595
ACCF/AHA/AATS/ACR/ASA/SCA/ROBEN/JUMPER/EVIDIO/TJ Guidelines for the
Diagnosis and Management of Patients with Thoracic Aortic Disease.
Circulation. 0595; 121: [id number]. Aortic aneurysm NOS
(HM13U-2CT.R). Moderate diffuse colonic diverticulosis. Mild
prostatomegaly. Aortic Atherosclerosis (HM13U-0U8.8).

## 2022-08-07 NOTE — Progress Notes (Signed)
Pulmonary Individual Treatment Plan  Patient Details  Name: Sean Stanley MRN: [id number] Date of Birth: [birth year] Referring Provider:   April Manson Pulmonary Rehab Walk Test from 05/31/2022 in [facility] & [place]  Referring Provider P[place]       Initial Encounter Date:  Flowsheet Row Pulmonary Rehab Walk Test from 05/31/2022 in [facility] for Heart, Vascular, & [place]  Date 05/31/22       Visit Diagnosis: Heart failure, chronic systolic ([place])  Patient's Home Medications on Admission:   Current Outpatient Medications:    acetaminophen (TYLENOL) 500 MG tablet, Take 1,000 mg by mouth daily as needed for moderate pain., Disp: , Rfl:    alfuzosin (UROXATRAL) 10 MG 24 hr tablet, Take 10 mg by mouth at bedtime. , Disp: , Rfl:    amiodarone (PACERONE) 200 MG tablet, Take 1 tablet (200 mg total) by mouth daily., Disp: 90 tablet, Rfl: 1   apixaban (ELIQUIS) 5 MG TABS tablet, Take 1 tablet (5 mg total) by mouth 2 (two) times daily., Disp: 60 tablet, Rfl: 3   buPROPion (WELLBUTRIN XL) 150 MG 24 hr tablet, Take 1 tablet (150 mg total) by mouth daily., Disp: 90 tablet, Rfl: 3   Cholecalciferol (VITAMIN D) 2000 units tablet, Take 2,000 Units by mouth at bedtime. , Disp: , Rfl:    ezetimibe (ZETIA) 10 MG tablet, Take 1 tablet (10 mg total) by mouth every morning., Disp: 90 tablet, Rfl: 2   finasteride (PROSCAR) 5 MG tablet, Take 5 mg by mouth daily with supper., Disp: , Rfl:    influenza vaccine adjuvanted (FLUAD) 0.5 ML injection, Inject into the muscle., Disp: 0.5 mL, Rfl: 0   levothyroxine (SYNTHROID) 175 MCG tablet, Take 1 tablet (175 mcg total) by mouth daily., Disp: , Rfl:    lidocaine-prilocaine (EMLA) cream, Apply to affected area once before sleep time-, Disp: 30 g, Rfl: 3   metoprolol succinate (TOPROL-XL) 25 MG 24 hr tablet, Take 12.5 mg by mouth daily., Disp: , Rfl:    Polyethyl Glycol-Propyl Glycol  (SYSTANE HYDRATION PF OP), Place 1 drop into both eyes 3 (three) times daily., Disp: , Rfl:    rosuvastatin (CRESTOR) 5 MG tablet, Take 1 tablet (5 mg total) by mouth every Monday, Wednesday, and Friday., Disp: 36 tablet, Rfl: 3  Past Medical History: Past Medical History:  Diagnosis Date   Abdominal aortic aneurysm, 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, IR could not access culprit lesion and treated with anticoag reversal; no AAA noted on 06/13/17 CTA   Aneurysm artery, 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[place])    followed at [facility]   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 [facility])    being followed at [facility]   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   s/p coiling 12/16/12 - [facility]   Atrial flutter ([facility])    BPH (benign prostatic hyperplasia)    Cancer ([place])    melanoma on lower right back and left chest - surgically removed and cleared   Difficult intubation    Dysrhythmia    H/O agent Orange exposure    Headache    migraine- not current   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 Maxwell Caul told him he had improved   Paroxysmal atrial fibrillation ([place]) 01/02/2021   Pneumonia    Thoracic ascending aortic aneurysm (HCC)    4.5 cm 03/2018 CT. 4.4 cm on echo 10/2021 and on CTA 03/2021   Thyroid cancer ([place])     Tobacco Use: Social History   Tobacco Use  Smoking Status Never  Smokeless Tobacco Never  Tobacco Comments   Never smoke 04/10/22    Labs: Review Flowsheet  More data exists      Latest Ref Rng & Units 02/22/2019 10/26/2020 02/13/2021 06/19/2021 09/03/2021  Labs for ITP Cardiac and Pulmonary Rehab  Cholestrol 100 - 199 mg/dL 185  184   210  - 156   LDL (calc) 0 - 99 mg/dL 104  112  127  - 78   HDL-C >39 mg/dL 58  52  65  - 61   Trlycerides 0 - 149 mg/dL 115  111  100  - 92   TCO2 22 - 32 mmol/L - - - 26  -    Capillary Blood Glucose: Lab Results  Component Value Date   GLUCAP 88 05/24/2019   GLUCAP 95 11/09/2018   GLUCAP 88 08/24/2018     Pulmonary Assessment Scores:  Pulmonary Assessment Scores     Row Name 05/31/22 0932         ADL UCSD   ADL Phase Entry     SOB Score total 7       CAT Score   CAT Score 0       mMRC Score   mMRC Score 2             UCSD: Self-administered rating of dyspnea associated with activities of daily living (ADLs) 6-point scale (0 = "not at all" to 5 = "maximal or unable to do because of breathlessness")  Scoring Scores range from 0 to 120.  Minimally important difference is 5 units  CAT: CAT can identify the health impairment of COPD patients and is better correlated with disease progression.  CAT has a scoring range of zero to 40. The CAT score is classified into four groups of low (less than 10), medium (10 - 20), high (21-30) and very high (31-40) based on the impact level of disease on health status. A CAT score over 10 suggests significant symptoms.  A worsening CAT score could be explained by an exacerbation, poor medication adherence, poor inhaler technique, or progression of COPD or comorbid conditions.  CAT MCID is 2 points  mMRC: mMRC (Modified Medical Research Council) Dyspnea Scale is used to assess the degree of baseline functional disability in patients of respiratory disease due to dyspnea. No minimal important difference is established. A decrease in score of 1 point or greater is considered a positive change.   Pulmonary Function Assessment:  Pulmonary Function Assessment - 05/31/22 0931       Breath   Shortness of Breath Panic with Shortness of Breath;Yes             Exercise Target Goals: Exercise Program Goal: Individual exercise  prescription set using results from initial 6 min walk test and THRR while considering  patient's activity barriers and safety.   Exercise Prescription Goal: Initial exercise prescription builds to 30-45 minutes a day of aerobic activity, 2-3 days per week.  Home exercise guidelines  will be given to patient during program as part of exercise prescription that the participant will acknowledge.  Activity Barriers & Risk Stratification:  Activity Barriers & Cardiac Risk Stratification - 05/31/22 0933       Activity Barriers & Cardiac Risk Stratification   Activity Barriers Deconditioning;Muscular Weakness;Shortness of Breath;Arthritis    Comments neuropathy in feet and meniscus knee surgery             6 Minute Walk:  6 Minute Walk     Row Name 05/31/22 1036         6 Minute Walk   Phase Initial     Distance 1200 feet     Walk Time 6 minutes     # of Rest Breaks 0     MPH 2.27     METS 1.79     RPE 10     Perceived Dyspnea  0     VO2 Peak 6.25     Symptoms No     Resting HR 63 bpm     Resting BP 102/60     Resting Oxygen Saturation  96 %     Exercise Oxygen Saturation  during 6 min walk 95 %     Max Ex. HR 75 bpm     Max Ex. BP 128/66     2 Minute Post BP 120/66       Interval HR   1 Minute HR 67     2 Minute HR 71     3 Minute HR 71     4 Minute HR 75     5 Minute HR 74     6 Minute HR 74     2 Minute Post HR 74     Interval Heart Rate? Yes       Interval Oxygen   Interval Oxygen? Yes     Baseline Oxygen Saturation % 96 %     1 Minute Oxygen Saturation % 95 %     1 Minute Liters of Oxygen 0 L     2 Minute Oxygen Saturation % 95 %     2 Minute Liters of Oxygen 0 L     3 Minute Oxygen Saturation % 95 %     3 Minute Liters of Oxygen 0 L     4 Minute Oxygen Saturation % 95 %     4 Minute Liters of Oxygen 0 L     5 Minute Oxygen Saturation % 95 %     5 Minute Liters of Oxygen 0 L     6 Minute Oxygen Saturation % 95 %     6 Minute Liters of Oxygen 0 L      2 Minute Post Oxygen Saturation % 96 %     2 Minute Post Liters of Oxygen 0 L              Oxygen Initial Assessment:  Oxygen Initial Assessment - 05/31/22 0930       Home Oxygen   Home Oxygen Device None    Sleep Oxygen Prescription None    Home Exercise Oxygen Prescription None    Home Resting Oxygen Prescription None      Initial 6 min Walk   Oxygen Used None      Program Oxygen Prescription   Program Oxygen Prescription None      Intervention   Short Term Goals To learn and understand importance of maintaining oxygen saturations>88%;To learn and demonstrate proper use of respiratory  medications;To learn and understand importance of monitoring SPO2 with pulse oximeter and demonstrate accurate use of the pulse oximeter.;To learn and demonstrate proper pursed lip breathing techniques or other breathing techniques.     Long  Term Goals Verbalizes importance of monitoring SPO2 with pulse oximeter and return demonstration;Maintenance of O2 saturations>88%;Exhibits proper breathing techniques, such as pursed lip breathing or other method taught during program session             Oxygen Re-Evaluation:  Oxygen Re-Evaluation     Row Name 06/04/22 0917 06/25/22 0939 07/30/22 0921         Program Oxygen Prescription   Program Oxygen Prescription None None None       Home Oxygen   Home Oxygen Device None None None     Sleep Oxygen Prescription None None None     Home Exercise Oxygen Prescription None None None     Home Resting Oxygen Prescription None None None       Goals/Expected Outcomes   Short Term Goals To learn and understand importance of maintaining oxygen saturations>88%;To learn and demonstrate proper use of respiratory medications;To learn and understand importance of monitoring SPO2 with pulse oximeter and demonstrate accurate use of the pulse oximeter.;To learn and demonstrate proper pursed lip breathing techniques or other breathing techniques.  To learn and  understand importance of maintaining oxygen saturations>88%;To learn and demonstrate proper use of respiratory medications;To learn and understand importance of monitoring SPO2 with pulse oximeter and demonstrate accurate use of the pulse oximeter.;To learn and demonstrate proper pursed lip breathing techniques or other breathing techniques.  To learn and understand importance of maintaining oxygen saturations>88%;To learn and demonstrate proper use of respiratory medications;To learn and understand importance of monitoring SPO2 with pulse oximeter and demonstrate accurate use of the pulse oximeter.;To learn and demonstrate proper pursed lip breathing techniques or other breathing techniques.      Long  Term Goals Verbalizes importance of monitoring SPO2 with pulse oximeter and return demonstration;Maintenance of O2 saturations>88%;Exhibits proper breathing techniques, such as pursed lip breathing or other method taught during program session Verbalizes importance of monitoring SPO2 with pulse oximeter and return demonstration;Maintenance of O2 saturations>88%;Exhibits proper breathing techniques, such as pursed lip breathing or other method taught during program session Verbalizes importance of monitoring SPO2 with pulse oximeter and return demonstration;Maintenance of O2 saturations>88%;Exhibits proper breathing techniques, such as pursed lip breathing or other method taught during program session     Goals/Expected Outcomes Compliance and understanding of oxygen saturation monitoring and breathing techniques to decrease shortness of breath Compliance and understanding of oxygen saturation monitoring and breathing techniques to decrease shortness of breath Compliance and understanding of oxygen saturation monitoring and breathing techniques to decrease shortness of breath              Oxygen Discharge (Final Oxygen Re-Evaluation):  Oxygen Re-Evaluation - 07/30/22 0921       Program Oxygen  Prescription   Program Oxygen Prescription None      Home Oxygen   Home Oxygen Device None    Sleep Oxygen Prescription None    Home Exercise Oxygen Prescription None    Home Resting Oxygen Prescription None      Goals/Expected Outcomes   Short Term Goals To learn and understand importance of maintaining oxygen saturations>88%;To learn and demonstrate proper use of respiratory medications;To learn and understand importance of monitoring SPO2 with pulse oximeter and demonstrate accurate use of the pulse oximeter.;To learn and demonstrate proper pursed lip breathing techniques or  other breathing techniques.     Long  Term Goals Verbalizes importance of monitoring SPO2 with pulse oximeter and return demonstration;Maintenance of O2 saturations>88%;Exhibits proper breathing techniques, such as pursed lip breathing or other method taught during program session    Goals/Expected Outcomes Compliance and understanding of oxygen saturation monitoring and breathing techniques to decrease shortness of breath             Initial Exercise Prescription:  Initial Exercise Prescription - 05/31/22 1000       Date of Initial Exercise RX and Referring Provider   Date 05/31/22    Referring Provider Pemberton    Expected Discharge Date 08/27/22      Arm Ergometer   Level 1    Watts 5    RPM 20    Minutes 15      Elliptical   Level 1    Speed 1    Minutes 15      Prescription Details   Frequency (times per week) 2    Duration Progress to 30 minutes of continuous aerobic without signs/symptoms of physical distress      Intensity   THRR 40-80% of Max Heartrate 56-111    Ratings of Perceived Exertion 11-13    Perceived Dyspnea 0-4      Progression   Progression Continue progressive overload as per policy without signs/symptoms or physical distress.      Resistance Training   Training Prescription Yes    Weight blue bands    Reps 10-15             Perform Capillary Blood Glucose  checks as needed.  Exercise Prescription Changes:   Exercise Prescription Changes     Row Name 06/11/22 1500 06/25/22 1600 07/02/22 1500 07/09/22 1500 07/23/22 1500     Response to Exercise   Blood Pressure (Admit) 100/56 102/58 -- 108/60 108/58   Blood Pressure (Exercise) 150/68 130/80 -- 120/60 112/60   Blood Pressure (Exit) 102/62 112/70 -- 108/60 116/62   Heart Rate (Admit) 56 bpm 58 bpm -- 53 bpm 54 bpm   Heart Rate (Exercise) 66 bpm 68 bpm -- 64 bpm 60 bpm   Heart Rate (Exit) 56 bpm 59 bpm -- 61 bpm 51 bpm   Oxygen Saturation (Admit) 96 % 95 % -- 96 % 96 %   Oxygen Saturation (Exercise) 97 % 95 % -- 95 % 95 %   Oxygen Saturation (Exit) 96 % 97 % -- 96 % 98 %   Rating of Perceived Exertion (Exercise) 10 11 -- 11 11   Perceived Dyspnea (Exercise) 1 0 -- 0 1   Duration Progress to 30 minutes of  aerobic without signs/symptoms of physical distress Continue with 30 min of aerobic exercise without signs/symptoms of physical distress. -- Continue with 30 min of aerobic exercise without signs/symptoms of physical distress. Continue with 30 min of aerobic exercise without signs/symptoms of physical distress.   Intensity THRR unchanged THRR unchanged -- THRR unchanged THRR unchanged     Progression   Progression Continue to progress workloads to maintain intensity without signs/symptoms of physical distress. Continue to progress workloads to maintain intensity without signs/symptoms of physical distress. -- Continue to progress workloads to maintain intensity without signs/symptoms of physical distress. Continue to progress workloads to maintain intensity without signs/symptoms of physical distress.     Resistance Training   Training Prescription Yes Yes -- Yes Yes   Weight blue bands black bands -- black bands black bands   Reps 10-15  10-15 -- 10-15 10-15   Time 10 Minutes 10 Minutes -- 10 Minutes 10 Minutes     Arm Ergometer   Level 1 4 -- 5 5   Watts 1 15 -- 19 18   RPM 53 83 -- 80  --   Minutes 15 15 -- 15 15     Recumbant Elliptical   Level -- 3 -- 6 7   RPM -- -- -- 60 60   Minutes -- 15 -- 15 15   METs -- 3.9 -- 3.1 3.9     Elliptical   Level 1 -- -- -- --   Speed 1 -- -- -- --   Minutes 15 -- -- -- --     Home Exercise Plan   Plans to continue exercise at -- -- [employer] (comment)  pool -- --   Frequency -- -- --  n/a -- --   Initial Home Exercises Provided -- -- 07/02/22 -- --    Row Name 08/06/22 1500             Response to Exercise   Blood Pressure (Admit) 120/64       Blood Pressure (Exercise) 140/70       Blood Pressure (Exit) 120/64       Heart Rate (Admit) 56 bpm       Heart Rate (Exercise) 65 bpm       Heart Rate (Exit) 53 bpm       Oxygen Saturation (Admit) 98 %       Oxygen Saturation (Exercise) 97 %       Oxygen Saturation (Exit) 96 %       Rating of Perceived Exertion (Exercise) 9       Perceived Dyspnea (Exercise) 0       Duration Continue with 30 min of aerobic exercise without signs/symptoms of physical distress.       Intensity THRR unchanged         Progression   Progression Continue to progress workloads to maintain intensity without signs/symptoms of physical distress.         Resistance Training   Training Prescription Yes       Weight black bands       Reps 10-15       Time 10 Minutes         Arm Ergometer   Level 6       Watts 22       Minutes 15         Track   Laps 14       Minutes 15       METs 3.15                Exercise Comments:   Exercise Comments     Row Name 07/02/22 1559           Exercise Comments Discussed with pt home exercise plan. He is currently doing pool aerobic exercise x3 a week. Encouraged pt to continue. He likes to walk but struggles to walk long due to peripheral neuropathy. He has goals to lose weight. Encouraged him to look at gym equipment for when he graduates [employer].                Exercise Goals and Review:   Exercise Goals     Row Name 05/31/22  0934 06/04/22 0911 06/25/22 0931 07/30/22 0918       Exercise Goals   Increase Physical Activity Yes Yes Yes  Yes    Intervention Provide advice, education, support and counseling about physical activity/exercise needs.;Develop an individualized exercise prescription for aerobic and resistive training based on initial evaluation findings, risk stratification, comorbidities and participant's personal goals. Provide advice, education, support and counseling about physical activity/exercise needs.;Develop an individualized exercise prescription for aerobic and resistive training based on initial evaluation findings, risk stratification, comorbidities and participant's personal goals. Provide advice, education, support and counseling about physical activity/exercise needs.;Develop an individualized exercise prescription for aerobic and resistive training based on initial evaluation findings, risk stratification, comorbidities and participant's personal goals. Provide advice, education, support and counseling about physical activity/exercise needs.;Develop an individualized exercise prescription for aerobic and resistive training based on initial evaluation findings, risk stratification, comorbidities and participant's personal goals.    Expected Outcomes Short Term: Attend rehab on a regular basis to increase amount of physical activity.;Long Term: Add in home exercise to make exercise part of routine and to increase amount of physical activity.;Long Term: Exercising regularly at least 3-5 days a week. Short Term: Attend rehab on a regular basis to increase amount of physical activity.;Long Term: Add in home exercise to make exercise part of routine and to increase amount of physical activity.;Long Term: Exercising regularly at least 3-5 days a week. Short Term: Attend rehab on a regular basis to increase amount of physical activity.;Long Term: Add in home exercise to make exercise part of routine and to increase  amount of physical activity.;Long Term: Exercising regularly at least 3-5 days a week. Short Term: Attend rehab on a regular basis to increase amount of physical activity.;Long Term: Add in home exercise to make exercise part of routine and to increase amount of physical activity.;Long Term: Exercising regularly at least 3-5 days a week.    Increase Strength and Stamina Yes Yes Yes Yes    Intervention Provide advice, education, support and counseling about physical activity/exercise needs.;Develop an individualized exercise prescription for aerobic and resistive training based on initial evaluation findings, risk stratification, comorbidities and participant's personal goals. Provide advice, education, support and counseling about physical activity/exercise needs.;Develop an individualized exercise prescription for aerobic and resistive training based on initial evaluation findings, risk stratification, comorbidities and participant's personal goals. Provide advice, education, support and counseling about physical activity/exercise needs.;Develop an individualized exercise prescription for aerobic and resistive training based on initial evaluation findings, risk stratification, comorbidities and participant's personal goals. Provide advice, education, support and counseling about physical activity/exercise needs.;Develop an individualized exercise prescription for aerobic and resistive training based on initial evaluation findings, risk stratification, comorbidities and participant's personal goals.    Expected Outcomes Short Term: Increase workloads from initial exercise prescription for resistance, speed, and METs.;Short Term: Perform resistance training exercises routinely during rehab and add in resistance training at home;Long Term: Improve cardiorespiratory fitness, muscular endurance and strength as measured by increased METs and functional capacity ( ) Short Term: Increase workloads from initial  exercise prescription for resistance, speed, and METs.;Short Term: Perform resistance training exercises routinely during rehab and add in resistance training at home;Long Term: Improve cardiorespiratory fitness, muscular endurance and strength as measured by increased METs and functional capacity ( ) Short Term: Increase workloads from initial exercise prescription for resistance, speed, and METs.;Short Term: Perform resistance training exercises routinely during rehab and add in resistance training at home;Long Term: Improve cardiorespiratory fitness, muscular endurance and strength as measured by increased METs and functional capacity ( ) Short Term: Increase workloads from initial exercise prescription for resistance, speed, and METs.;Short Term: Perform resistance training exercises routinely during rehab  and add in resistance training at home;Long Term: Improve cardiorespiratory fitness, muscular endurance and strength as measured by increased METs and functional capacity ( )    Able to understand and use rate of perceived exertion (RPE) scale Yes Yes Yes Yes    Intervention Provide education and explanation on how to use RPE scale Provide education and explanation on how to use RPE scale Provide education and explanation on how to use RPE scale Provide education and explanation on how to use RPE scale    Expected Outcomes Short Term: Able to use RPE daily in rehab to express subjective intensity level;Long Term:  Able to use RPE to guide intensity level when exercising independently Short Term: Able to use RPE daily in rehab to express subjective intensity level;Long Term:  Able to use RPE to guide intensity level when exercising independently Short Term: Able to use RPE daily in rehab to express subjective intensity level;Long Term:  Able to use RPE to guide intensity level when exercising independently Short Term: Able to use RPE daily in rehab to express subjective intensity level;Long Term:   Able to use RPE to guide intensity level when exercising independently    Knowledge and understanding of Target Heart Rate Range (THRR) Yes Yes Yes Yes    Intervention Provide education and explanation of THRR including how the numbers were predicted and where they are located for reference Provide education and explanation of THRR including how the numbers were predicted and where they are located for reference Provide education and explanation of THRR including how the numbers were predicted and where they are located for reference Provide education and explanation of THRR including how the numbers were predicted and where they are located for reference    Expected Outcomes Short Term: Able to state/look up THRR;Long Term: Able to use THRR to govern intensity when exercising independently;Short Term: Able to use daily as guideline for intensity in rehab Short Term: Able to state/look up THRR;Long Term: Able to use THRR to govern intensity when exercising independently;Short Term: Able to use daily as guideline for intensity in rehab Short Term: Able to state/look up THRR;Long Term: Able to use THRR to govern intensity when exercising independently;Short Term: Able to use daily as guideline for intensity in rehab Short Term: Able to state/look up THRR;Long Term: Able to use THRR to govern intensity when exercising independently;Short Term: Able to use daily as guideline for intensity in rehab    Understanding of Exercise Prescription Yes Yes Yes Yes    Intervention Provide education, explanation, and written materials on patient's individual exercise prescription Provide education, explanation, and written materials on patient's individual exercise prescription Provide education, explanation, and written materials on patient's individual exercise prescription Provide education, explanation, and written materials on patient's individual exercise prescription    Expected Outcomes Short Term: Able to explain  program exercise prescription;Long Term: Able to explain home exercise prescription to exercise independently Short Term: Able to explain program exercise prescription;Long Term: Able to explain home exercise prescription to exercise independently Short Term: Able to explain program exercise prescription;Long Term: Able to explain home exercise prescription to exercise independently Short Term: Able to explain program exercise prescription;Long Term: Able to explain home exercise prescription to exercise independently             Exercise Goals Re-Evaluation :  Exercise Goals Re-Evaluation     Row Name 06/04/22 0911 06/25/22 0931 07/30/22 0918         Exercise Goal Re-Evaluation   Exercise  Goals Review Increase Physical Activity;Able to understand and use Dyspnea scale;Understanding of Exercise Prescription;Increase Strength and Stamina;Knowledge and understanding of Target Heart Rate Range (THRR);Able to understand and use rate of perceived exertion (RPE) scale Increase Physical Activity;Able to understand and use Dyspnea scale;Understanding of Exercise Prescription;Increase Strength and Stamina;Knowledge and understanding of Target Heart Rate Range (THRR);Able to understand and use rate of perceived exertion (RPE) scale Increase Physical Activity;Able to understand and use Dyspnea scale;Understanding of Exercise Prescription;Increase Strength and Stamina;Knowledge and understanding of Target Heart Rate Range (THRR);Able to understand and use rate of perceived exertion (RPE) scale     Comments Pt is to begin exercise on 3/14. Will monitor for progression. Pt has exercised 5 sessions. He is exercising on the arm ergometer for 15 min, level 4, 16 watts, RPM 53. He then exercises on the recumbent elliptical for 15 min, level 3, METs 3.6. He is tolerating progression well and quickly. Performs warm up and cool down independently without limitations.  Will monitor for progression. Pt has exercised 13  sessions. He is exercising on the arm ergometer for 15 min, level 5, 18 watts, RPM 53. He then exercises on the recumbent elliptical for 15 min, level 7, METs 5.8. He is tolerating progression well and quickly. Performs warm up and cool down independently without limitations, uses black resistance bands.  Will monitor for progression.     Expected Outcomes Through exercise at rehab and home the patient will decrease shortness of breath with daily activities and feel confident in carrying out an exercise regimn at home Through exercise at rehab and home the patient will decrease shortness of breath with daily activities and feel confident in carrying out an exercise regimn at home Through exercise at rehab and home the patient will decrease shortness of breath with daily activities and feel confident in carrying out an exercise regimn at home              Discharge Exercise Prescription (Final Exercise Prescription Changes):  Exercise Prescription Changes - 08/06/22 1500       Response to Exercise   Blood Pressure (Admit) 120/64    Blood Pressure (Exercise) 140/70    Blood Pressure (Exit) 120/64    Heart Rate (Admit) 56 bpm    Heart Rate (Exercise) 65 bpm    Heart Rate (Exit) 53 bpm    Oxygen Saturation (Admit) 98 %    Oxygen Saturation (Exercise) 97 %    Oxygen Saturation (Exit) 96 %    Rating of Perceived Exertion (Exercise) 9    Perceived Dyspnea (Exercise) 0    Duration Continue with 30 min of aerobic exercise without signs/symptoms of physical distress.    Intensity THRR unchanged      Progression   Progression Continue to progress workloads to maintain intensity without signs/symptoms of physical distress.      Resistance Training   Training Prescription Yes    Weight black bands    Reps 10-15    Time 10 Minutes      Arm Ergometer   Level 6    Watts 22    Minutes 15      Track   Laps 14    Minutes 15    METs 3.15             Nutrition:  Target Goals:  Understanding of nutrition guidelines, daily intake of sodium 1500mg , cholesterol 200mg , calories 30% from fat and 7% or less from saturated fats, daily to have 5 or more servings  of fruits and vegetables.  Biometrics:  Pre Biometrics - 05/31/22 0916       Pre Biometrics   Grip Strength 48 kg              Nutrition Therapy Plan and Nutrition Goals:  Nutrition Therapy & Goals - 07/30/22 1502       Nutrition Therapy   Diet Heart Healthy Diet    Drug/Food Interactions Statins/Certain Fruits      Personal Nutrition Goals   Nutrition Goal Patient to improve diet quality by using the plate method as a guide for meal planning to include lean protein/plant protein, fruits, vegetables, whole grains, nonfat dairy as part of a well-balanced diet.    Personal Goal #2 Patient to identify strategies for weight loff of 0.5-2.0# per week.    Comments Goals in action. He reports increased appetite and poor sleep since starting amiodarone. He is motivated to lose weight to 210-200#. He has made an effort to reduce sweets, carbohydrate portions, and sugary beverages to aid with weight loss. He is down 5.3# since starting with our program. He will benefit from partication in pulmonary rehab for nutrition, exercise, and lifestyle modification.      Intervention Plan   Intervention Prescribe, educate and counsel regarding individualized specific dietary modifications aiming towards targeted core components such as weight, hypertension, lipid management, diabetes, heart failure and other comorbidities.;Nutrition handout(s) given to patient.    Expected Outcomes Short Term Goal: Understand basic principles of dietary content, such as calories, fat, sodium, cholesterol and nutrients.;Long Term Goal: Adherence to prescribed nutrition plan.             Nutrition Assessments:  MEDIFICTS Score Key: ?70 Need to make dietary changes  40-70 Heart Healthy Diet ? 40 Therapeutic Level Cholesterol  Diet   Picture Your Plate Scores: <16 Unhealthy dietary pattern with much room for improvement. 41-50 Dietary pattern unlikely to meet recommendations for good health and room for improvement. 51-60 More healthful dietary pattern, with some room for improvement.  >60 Healthy dietary pattern, although there may be some specific behaviors that could be improved.    Nutrition Goals Re-Evaluation:  Nutrition Goals Re-Evaluation     Row Name 06/06/22 1548 07/02/22 1442 07/30/22 1502         Goals   Current Weight 222 lb 3.6 oz (100.8 kg) 220 lb 0.3 oz (99.8 kg) 215 lb 2.7 oz (97.6 kg)     Comment lipids WNL, A1c WNL No new labs; most recent labs lipids WNL, A1c WNL No new labs; most recent labs lipids WNL, A1c WNL     Expected Outcome Dan lives at [facility] and eats 1-2 meals daily there; he does have access to a dietitian there. He enjoys a wide variety of foods. He reports increased appetite since starting amiodarone. He is motivated to lose weight to 210-200#. He will benefit from partication in pulmonary rehab for nutrition, exercise, and lifestyle modification. Goals in action. He reports increased appetite and poor sleep since starting amiodarone. He is motivated to lose weight to 210-200#. He has made an effort to reduce sweets, carbohydrate portions, and juice to aid with weight loss. He has maintained his weight at this time. He will benefit from partication in pulmonary rehab for nutrition, exercise, and lifestyle modification. Goals in action. He reports increased appetite and poor sleep since starting amiodarone. He is motivated to lose weight to 210-200#. He has made an effort to reduce sweets, carbohydrate portions, and sugary beverages to aid  with weight loss. He is down 5.3# since starting with our program. He will benefit from partication in pulmonary rehab for nutrition, exercise, and lifestyle modification.              Nutrition Goals Discharge (Final Nutrition Goals  Re-Evaluation):  Nutrition Goals Re-Evaluation - 07/30/22 1502       Goals   Current Weight 215 lb 2.7 oz (97.6 kg)    Comment No new labs; most recent labs lipids WNL, A1c WNL    Expected Outcome Goals in action. He reports increased appetite and poor sleep since starting amiodarone. He is motivated to lose weight to 210-200#. He has made an effort to reduce sweets, carbohydrate portions, and sugary beverages to aid with weight loss. He is down 5.3# since starting with our program. He will benefit from partication in pulmonary rehab for nutrition, exercise, and lifestyle modification.             Psychosocial: Target Goals: Acknowledge presence or absence of significant depression and/or stress, maximize coping skills, provide positive support system. Participant is able to verbalize types and ability to use techniques and skills needed for reducing stress and depression.  Initial Review & Psychosocial Screening:  Initial Psych Review & Screening - 05/31/22 1610       Initial Review   Current issues with Current Anxiety/Panic    Comments Anxious about health and family. Takes meds for anxiety      Family Dynamics   Good Support System? Yes    Comments wife and 2 daughters      Barriers   Psychosocial barriers to participate in program The patient should benefit from training in stress management and relaxation.;There are no identifiable barriers or psychosocial needs.      Screening Interventions   Interventions Encouraged to exercise    Expected Outcomes --             Quality of Life Scores:  Scores of 19 and below usually indicate a poorer quality of life in these areas.  A difference of  2-3 points is a clinically meaningful difference.  A difference of 2-3 points in the total score of the Quality of Life Index has been associated with significant improvement in overall quality of life, self-image, physical symptoms, and general health in studies assessing change in  quality of life.  PHQ-9: Review Flowsheet       05/31/2022  Depression screen PHQ 2/9  Decreased Interest 0  Down, Depressed, Hopeless 0  PHQ - 2 Score 0  Altered sleeping 0  Tired, decreased energy 0  Change in appetite 0  Feeling bad or failure about yourself  0  Trouble concentrating 0  Moving slowly or fidgety/restless 0  Suicidal thoughts 0  PHQ-9 Score 0  Difficult doing work/chores Not difficult at all   Interpretation of Total Score  Total Score Depression Severity:  1-4 = Minimal depression, 5-9 = Mild depression, 10-14 = Moderate depression, 15-19 = Moderately severe depression, 20-27 = Severe depression   Psychosocial Evaluation and Intervention:  Psychosocial Evaluation - 05/31/22 0926       Psychosocial Evaluation & Interventions   Interventions Stress management education;Relaxation education;Encouraged to exercise with the program and follow exercise prescription    Comments Jesusita Oka is anxious about his health and his family. He is on meds for his anxiety.    Expected Outcomes For Dan to participate in rehab free of psychosocial concerns.    Continue Psychosocial Services  Follow up required by  staff             Psychosocial Re-Evaluation:  Psychosocial Re-Evaluation     Row Name 05/31/22 1518 07/01/22 1423 07/29/22 1102         Psychosocial Re-Evaluation   Current issues with Current Anxiety/Panic Current Anxiety/Panic Current Stress Concerns     Comments Jesusita Oka is scheduled to start PR on 3/14. We will continue to monitor him. Jesusita Oka endorses that he is not sleeping well due to terrifying nightmares that wake him up in the middle of the night. 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 He stated that since starting amiodarone the nightmares started. He has a planned ablation, but not until July. Jesusita Oka hopes to get off the [employer] after the procedure. RN recommended Jesusita Oka to call his cardiologist about the side effects but he states he will just "deal with  it" until July. Jesusita Oka expresses he is currently stressed about putting on weight since going on vacation. He hopes to get back into a routine of healthy eating and exercising.     Expected Outcomes For Dan to participate in PR free of any psychosocial barriers or concerns For Jesusita Oka to continue participating in PR, get better sleep, and have decreased nightmares For Dan to continue partcipating in PR free of any psychosocial barriers or concerns.     Interventions Encouraged to attend Pulmonary Rehabilitation for the exercise Encouraged to attend Pulmonary Rehabilitation for the exercise Encouraged to attend Pulmonary Rehabilitation for the exercise     Continue Psychosocial Services  No Follow up required No Follow up required No Follow up required              Psychosocial Discharge (Final Psychosocial Re-Evaluation):  Psychosocial Re-Evaluation - 07/29/22 1102       Psychosocial Re-Evaluation   Current issues with Current Stress Concerns    Comments Jesusita Oka expresses he is currently stressed about putting on weight since going on vacation. He hopes to get back into a routine of healthy eating and exercising.    Expected Outcomes For Dan to continue partcipating in PR free of any psychosocial barriers or concerns.    Interventions Encouraged to attend Pulmonary Rehabilitation for the exercise    Continue Psychosocial Services  No Follow up required             Education: Education Goals: Education classes will be provided on a weekly basis, covering required topics. Participant will state understanding/return demonstration of topics presented.  Learning Barriers/Preferences:  Learning Barriers/Preferences - 05/31/22 1308       Learning Barriers/Preferences   Learning Barriers Sight   wears glasses   Learning Preferences Skilled Demonstration             Education Topics: Introduction to Pulmonary Rehab Group instruction provided by PowerPoint, verbal discussion, and written  material to support subject matter. Instructor reviews what Pulmonary Rehab is, the purpose of the program, and how patients are referred.     Know Your Numbers Group instruction that is supported by a PowerPoint presentation. Instructor discusses importance of knowing and understanding resting, exercise, and post-exercise oxygen saturation, heart rate, and blood pressure. Oxygen saturation, heart rate, blood pressure, rating of perceived exertion, and dyspnea are reviewed along with a normal range for these values.  Flowsheet Row PULMONARY REHAB OTHER RESPIRATORY from 07/04/2022 in [facility]  Date 07/04/22  Educator EP  Instruction Review Code 1- Verbalizes Understanding  Exercise for the Pulmonary Patient Group instruction that is supported by a PowerPoint presentation. Instructor discusses benefits of exercise, core components of exercise, frequency, duration, and intensity of an exercise routine, importance of utilizing pulse oximetry during exercise, safety while exercising, and options of places to exercise outside of rehab.  Flowsheet Row PULMONARY REHAB OTHER RESPIRATORY from 06/27/2022 in [facility]  Date 06/27/22  Educator EP  Instruction Review Code 1- Verbalizes Understanding          MET Level  Group instruction provided by PowerPoint, verbal discussion, and written material to support subject matter. Instructor reviews what METs are and how to increase METs.  Flowsheet Row PULMONARY REHAB OTHER RESPIRATORY from 07/25/2022 in [facility]  Date 07/25/22  Educator EP  Instruction Review Code 1- Verbalizes Understanding       Pulmonary Medications Verbally interactive group education provided by instructor with focus on inhaled medications and proper administration. Flowsheet Row PULMONARY  REHAB OTHER RESPIRATORY from 06/20/2022 in [facility]  Date 06/20/22  Educator RT  Instruction Review Code 1- Verbalizes Understanding       Anatomy and Physiology of the Respiratory System Group instruction provided by PowerPoint, verbal discussion, and written material to support subject matter. Instructor reviews respiratory cycle and anatomical components of the respiratory system and their functions. Instructor also reviews differences in obstructive and restrictive respiratory diseases with examples of each.    Oxygen Safety Group instruction provided by PowerPoint, verbal discussion, and written material to support subject matter. There is an overview of "What is Oxygen" and "Why do we need it".  Instructor also reviews how to create a safe environment for oxygen use, the importance of using oxygen as prescribed, and the risks of noncompliance. There is a brief discussion on traveling with oxygen and resources the patient may utilize. Flowsheet Row PULMONARY REHAB OTHER RESPIRATORY from 07/11/2022 in [facility]  Date 07/11/22  Educator RN  Instruction Review Code 1- Verbalizes Understanding       Oxygen Use Group instruction provided by PowerPoint, verbal discussion, and written material to discuss how supplemental oxygen is prescribed and different types of oxygen supply systems. Resources for more information are provided.    Breathing Techniques Group instruction that is supported by demonstration and informational handouts. Instructor discusses the benefits of pursed lip and diaphragmatic breathing and detailed demonstration on how to perform both.     Risk Factor Reduction Group instruction that is supported by a PowerPoint presentation. Instructor discusses the definition of a risk factor, different risk factors for pulmonary disease, and how the heart and  lungs work together.   MD Day A group question and answer session with a medical doctor that allows participants to ask questions that relate to their pulmonary disease state.   Nutrition for the Pulmonary Patient Group instruction provided by PowerPoint slides, verbal discussion, and written materials to support subject matter. The instructor gives an explanation and review of healthy diet recommendations, which includes a discussion on weight management, recommendations for fruit and vegetable consumption, as well as protein, fluid, caffeine, fiber, sodium, sugar, and alcohol. Tips for eating when patients are short of breath are discussed.    Other Education Group or individual verbal, written, or video instructions that support the educational goals of the pulmonary rehab program.  Knowledge Questionnaire Score:  Knowledge Questionnaire Score - 05/31/22 1047       Knowledge Questionnaire Score   Pre Score 15/18             Core Components/Risk Factors/Patient Goals at Admission:  Personal Goals and Risk Factors at Admission - 05/31/22 0928       Core Components/Risk Factors/Patient Goals on Admission    Weight Management Yes;Weight Loss    Intervention Weight Management: Develop a combined nutrition and exercise program designed to reach desired caloric intake, while maintaining appropriate intake of nutrient and fiber, sodium and fats, and appropriate energy expenditure required for the weight goal.;Weight Management: Provide education and appropriate resources to help participant work on and attain dietary goals.;Weight Management/Obesity: Establish reasonable short term and long term weight goals.    Expected Outcomes Short Term: Continue to assess and modify interventions until short term weight is achieved;Long Term: Adherence to nutrition and physical activity/exercise program aimed toward attainment of established weight goal;Weight Maintenance: Understanding of the  daily nutrition guidelines, which includes 25-35% calories from fat, 7% or less cal from saturated fats, less than 200mg  cholesterol, less than 1.5gm of sodium, & 5 or more servings of fruits and vegetables daily;Weight Loss: Understanding of general recommendations for a balanced deficit meal plan, which promotes 1-2 lb weight loss per week and includes a negative energy balance of [phone] kcal/d;Understanding recommendations for meals to include 15-35% energy as protein, 25-35% energy from fat, 35-60% energy from carbohydrates, less than 200mg  of dietary cholesterol, 20-35 gm of total fiber daily;Understanding of distribution of calorie intake throughout the day with the consumption of 4-5 meals/snacks    Improve shortness of breath with ADL's Yes    Intervention Provide education, individualized exercise plan and daily activity instruction to help decrease symptoms of SOB with activities of daily living.    Expected Outcomes Short Term: Improve cardiorespiratory fitness to achieve a reduction of symptoms when performing ADLs;Long Term: Be able to perform more ADLs without symptoms or delay the onset of symptoms             Core Components/Risk Factors/Patient Goals Review:   Goals and Risk Factor Review     Row Name 05/31/22 1520 07/01/22 1428 07/29/22 1108         Core Components/Risk Factors/Patient Goals Review   Personal Goals Review Improve shortness of breath with ADL's;Develop more efficient breathing techniques such as purse lipped breathing and diaphragmatic breathing and practicing self-pacing with activity.;Weight Management/Obesity Improve shortness of breath with ADL's;Develop more efficient breathing techniques such as purse lipped breathing and diaphragmatic breathing and practicing self-pacing with activity.;Weight Management/Obesity Weight Management/Obesity;Improve shortness of breath with ADL's     Review Jesusita Oka is scheduled to start PR on 3/14 Jesusita Oka stated that after moving  into a retirement facility his weight has gone up. He stated that he gets his meals served to him and it's always 4 courses. He said his wife and him also like to social with friends with drinks and snacks. Jesusita Oka made a goal of weight loss but has maintained since starting. He is working with our dietitian on achieving weight loss. He has decreased his shortness of breath and can use pursed lip breathing without being prompted. Jesusita Oka has completed 7 classes so far. He is exercising on the arm egrometer and the seated elliptical. He is making great improvements on both machines increasing his workload and METs. Jesusita Oka likes coming to class and speaking with other veterans. Jesusita Oka is making progress  on his goal of losing weight. Jesusita Oka is currently working with dietician on weight loss goals (see dietician note).  Jesusita Oka is able to demonstrate proper breathing techniques such as purse lip breathing, diaphragmatic breathing and self pacing.     Expected Outcomes See admission goals For Dan to lose weight, improve his shortness of breath, develop more efficient breathing techniques, and any other goals set during orientation. See admission goals              Core Components/Risk Factors/Patient Goals at Discharge (Final Review):   Goals and Risk Factor Review - 07/29/22 1108       Core Components/Risk Factors/Patient Goals Review   Personal Goals Review Weight Management/Obesity;Improve shortness of breath with ADL's    Review Jesusita Oka is making progress on his goal of losing weight. Jesusita Oka is currently working with dietician on weight loss goals (see dietician note).  Jesusita Oka is able to demonstrate proper breathing techniques such as purse lip breathing, diaphragmatic breathing and self pacing.    Expected Outcomes See admission goals             ITP Comments:Pt is making expected progress toward Pulmonary Rehab goals after completing 16 sessions. Recommend continued exercise, life style modification, education, and  utilization of breathing techniques to increase stamina and strength, while also decreasing shortness of breath with exertion.  Dr. Mechele Collin is Medical Director for Pulmonary Rehab at [facility].     Comments: Dr. Mechele Collin is Medical Director for Pulmonary Rehab at [facility].

## 2022-08-08 ENCOUNTER — Encounter (HOSPITAL_COMMUNITY)
Admission: RE | Admit: 2022-08-08 | Discharge: 2022-08-08 | Disposition: A | Discharge status: Home / Self Care | Payer: Medicare Other | Source: Ambulatory Visit | Attending: Cardiology | Admitting: Cardiology

## 2022-08-08 DIAGNOSIS — I5022 Chronic systolic (congestive) heart failure: Secondary | ICD-10-CM

## 2022-08-08 NOTE — Progress Notes (Signed)
Daily Session Note  Patient Details  Name: Sean Stanley MRN: [id number] Date of Birth: 1940-08-11 Referring Provider:   Doristine Devoid Pulmonary Rehab Walk Test from 05/31/2022 in [facility]  Referring Provider Shari Prows       Encounter Date: 08/08/2022  Check In:  Session Check In - 08/08/22 1444       Check-In   Supervising physician immediately available to respond to emergencies [facility] MD immediately available    Physician(s) Eligha Bridegroom, NP    Location [facility]    Staff Present Samantha Belarus, RD, Dutch Gray, RN, Doris Cheadle, MS, ACSM-CEP, Exercise Physiologist;Randi Dionisio Paschal, ACSM-CEP, Exercise Physiologist;Casey Katrinka Blazing, RT    Virtual Visit No    Medication changes reported     No    Fall or balance concerns reported    No    Tobacco Cessation No Change    Warm-up and Cool-down Performed as group-led instruction    Resistance Training Performed Yes    VAD Patient? No    PAD/SET Patient? No      Pain Assessment   Currently in Pain? No/denies    Multiple Pain Sites No             Capillary Blood Glucose: No results found for this or any previous visit (from the past 24 hour(s)).    Social History   Tobacco Use  Smoking Status Never  Smokeless Tobacco Never  Tobacco Comments   Never smoke 04/10/22    Goals Met:  Independence with exercise equipment Exercise tolerated well No report of concerns or symptoms today Strength training completed today  Goals Unmet:  Not Applicable  Comments: Service time is from 1310 to 1455    Dr. Mechele Collin is Medical Director for Pulmonary Rehab at [facility].

## 2022-08-13 ENCOUNTER — Encounter (HOSPITAL_COMMUNITY)
Admission: RE | Admit: 2022-08-13 | Discharge: 2022-08-13 | Disposition: A | Discharge status: Home / Self Care | Payer: Medicare Other | Source: Ambulatory Visit | Attending: Cardiology | Admitting: Cardiology

## 2022-08-13 DIAGNOSIS — I5022 Chronic systolic (congestive) heart failure: Secondary | ICD-10-CM | POA: Diagnosis not present

## 2022-08-13 NOTE — Progress Notes (Signed)
Daily Session Note  Patient Details  Name: KEATS CAMERINO MRN: [id number] Date of Birth: [birth year] Referring Provider:   Doristine Devoid Pulmonary Rehab Walk Test from 05/31/2022 in [facility], V[facility], [facility]  Referring Provider Shari Prows       Encounter Date: 08/13/2022  Check In:  Session Check In - 08/13/22 1422       Check-In   Supervising physician immediately available to respond to emergencies [facility] MD immediately available    Physician(s) Bernadene Person, NP    [facility]    Staff Present Samantha Belarus, RD, Dutch Gray, RN, BSN;Randi Reeve BS, ACSM-CEP, Exercise Physiologist;Kaylee Earlene Plater, MS, ACSM-CEP, Exercise Physiologist;Saanvi Hakala Katrinka Blazing, RT    Virtual Visit No    Medication changes reported     No    Fall or balance concerns reported    No    Tobacco Cessation No Change    Warm-up and Cool-down Performed as group-led instruction    Resistance Training Performed Yes    VAD Patient? No    PAD/SET Patient? No      Pain Assessment   Currently in Pain? No/denies    Multiple Pain Sites No             Capillary Blood Glucose: No results found for this or any previous visit (from the past 24 hour(s)).    Social History   Tobacco Use  Smoking Status Never  Smokeless Tobacco Never  Tobacco Comments   Never smoke 04/10/22    Goals Met:  Proper associated with RPD/PD & O2 Sat Independence with exercise equipment Exercise tolerated well No report of concerns or symptoms today Strength training completed today  Goals Unmet:  Not Applicable  Comments: Service time is from 1322 to 1450.    Dr. Mechele Collin is Medical Director for Pulmonary Rehab at [facility].

## 2022-08-15 ENCOUNTER — Encounter (HOSPITAL_COMMUNITY)
Admission: RE | Admit: 2022-08-15 | Discharge: 2022-08-15 | Disposition: A | Discharge status: Home / Self Care | Payer: Medicare Other | Source: Ambulatory Visit | Attending: Cardiology | Admitting: Cardiology

## 2022-08-15 DIAGNOSIS — I5022 Chronic systolic (congestive) heart failure: Secondary | ICD-10-CM | POA: Diagnosis not present

## 2022-08-15 NOTE — Progress Notes (Signed)
Daily Session Note  Patient Details  Name: Sean Stanley MRN: [id number] Date of Birth: 1940/05/05 Referring Provider:   Doristine Devoid Pulmonary Rehab Walk Test from 05/31/2022 in [facility] for [facility], Vascular, & Lung [facility]  Referring Provider Shari Prows       Encounter Date: 08/15/2022  Check In:  Session Check In - 08/15/22 1428       Check-In   Supervising physician immediately available to respond to emergencies [facility] immediately available    Physician(s) Robin Searing, NP    [facility]    Staff Present Samantha Belarus, RD, Dutch Gray, RN, BSN;Randi Idelle Crouch BS, ACSM-CEP, Exercise Physiologist;Lavalle Skoda Earlene Plater, MS, ACSM-CEP, Exercise Physiologist;Casey Katrinka Blazing, RT    Virtual Visit No    Medication changes reported     No    Fall or balance concerns reported    No    Tobacco Cessation No Change    Warm-up and Cool-down Performed as group-led instruction    Resistance Training Performed Yes    VAD Patient? No      Pain Assessment   Currently in Pain? No/denies    Multiple Pain Sites No             Capillary Blood Glucose: No results found for this or any previous visit (from the past 24 hour(s)).    Social History   Tobacco Use  Smoking Status Never  Smokeless Tobacco Never  Tobacco Comments   Never smoke 04/10/22    Goals Met:  Proper associated with RPD/PD & O2 Sat Independence with exercise equipment Exercise tolerated well No report of concerns or symptoms today Strength training completed today  Goals Unmet:  Not Applicable  Comments: Service time is from 1316 to 1445.    Dr. Mechele Collin is Medical Director for Pulmonary Rehab at [facility].

## 2022-08-16 ENCOUNTER — Encounter: Payer: Self-pay | Admitting: Cardiology

## 2022-08-20 ENCOUNTER — Encounter (HOSPITAL_COMMUNITY)
Admission: RE | Admit: 2022-08-20 | Discharge: 2022-08-20 | Disposition: A | Discharge status: Home / Self Care | Payer: Medicare Other | Source: Ambulatory Visit | Attending: Cardiology | Admitting: Cardiology

## 2022-08-20 VITALS — Wt 215.6 lb

## 2022-08-20 DIAGNOSIS — I5022 Chronic systolic (congestive) heart failure: Secondary | ICD-10-CM | POA: Diagnosis not present

## 2022-08-20 NOTE — Progress Notes (Signed)
Daily Session Note  Patient Details  Name: Sean Stanley MRN: [id number] Date of Birth: Jun 08, 1940 Referring Provider:   Doristine Devoid Pulmonary Rehab Walk Test from 05/31/2022 in [facility] for Heart, Vascular, & Lung Health  Referring Provider Shari Prows       Encounter Date: 08/20/2022  Check In:  Session Check In - 08/20/22 1426       Check-In   Supervising physician immediately available to respond to emergencies [facility] immediately available    Physician(s) Robin Searing, NP    [facility]    Staff Present Samantha Belarus, RD, Dutch Gray, RN, BSN;Randi Idelle Crouch BS, ACSM-CEP, Exercise Physiologist;Kaylee Earlene Plater, MS, ACSM-CEP, Exercise Physiologist;Casey Katrinka Blazing, RT    Virtual Visit No    Medication changes reported     No    Fall or balance concerns reported    No    Tobacco Cessation No Change    Warm-up and Cool-down Performed as group-led instruction    Resistance Training Performed Yes    VAD Patient? No    PAD/SET Patient? No      Pain Assessment   Currently in Pain? No/denies    Multiple Pain Sites No             Capillary Blood Glucose: No results found for this or any previous visit (from the past 24 hour(s)).   Exercise Prescription Changes - 08/20/22 1500       Response to Exercise   Blood Pressure (Admit) 108/56    Blood Pressure (Exercise) 164/80    Blood Pressure (Exit) 100/54    Heart Rate (Admit) 54 bpm    Heart Rate (Exercise) 69 bpm    Heart Rate (Exit) 55 bpm    Oxygen Saturation (Admit) 97 %    Oxygen Saturation (Exercise) 95 %    Oxygen Saturation (Exit) 96 %    Rating of Perceived Exertion (Exercise) 13    Perceived Dyspnea (Exercise) 0    Duration Continue with 30 min of aerobic exercise without signs/symptoms of physical distress.    Intensity THRR unchanged      Progression   Progression Continue to progress workloads to maintain intensity without signs/symptoms of physical distress.       Resistance Training   Training Prescription Yes    Weight black bands    Reps 10-15    Time 10 Minutes      Arm Ergometer   Level 5    Minutes 15    METs 3.5      Track   Laps 15    Minutes 15    METs 3.31             Social History   Tobacco Use  Smoking Status Never  Smokeless Tobacco Never  Tobacco Comments   Never smoke 04/10/22    Goals Met:  Independence with exercise equipment Exercise tolerated well No report of concerns or symptoms today Strength training completed today  Goals Unmet:  Not Applicable  Comments: Service time is from 1306 to 1450    Dr. Mechele Collin is Medical Director for Pulmonary Rehab at [facility].

## 2022-08-22 ENCOUNTER — Encounter (HOSPITAL_COMMUNITY)
Admission: RE | Admit: 2022-08-22 | Discharge: 2022-08-22 | Disposition: A | Discharge status: Home / Self Care | Payer: Medicare Other | Source: Ambulatory Visit | Attending: Cardiology | Admitting: Cardiology

## 2022-08-22 DIAGNOSIS — I5022 Chronic systolic (congestive) heart failure: Secondary | ICD-10-CM | POA: Diagnosis not present

## 2022-08-22 NOTE — Progress Notes (Signed)
Daily Session Note  Patient Details  Name: Sean Stanley MRN: [id number] Date of Birth: Aug 16, 1940 Referring Provider:   Doristine Devoid Pulmonary Rehab Walk Test from 05/31/2022 in [facility], [facility]  Referring Provider Shari Prows       Encounter Date: 08/22/2022  Check In:  Session Check In - 08/22/22 1410       Check-In   Supervising physician immediately available to respond to emergencies [facility] MD immediately available    Physician(s) Edd Fabian, NP    Location [facility]    Staff Present Samantha Belarus, RD, Dutch Gray, RN, BSN;Randi Reeve BS, ACSM-CEP, Exercise Physiologist;Casey Heloise Ochoa, MS, ACSM-CEP, CCRP, Exercise Physiologist;Kyria Bumgardner Earlene Plater, MS, ACSM-CEP, Exercise Physiologist    Virtual Visit No    Medication changes reported     No    Fall or balance concerns reported    No    Tobacco Cessation No Change    Warm-up and Cool-down Performed as group-led instruction    Resistance Training Performed Yes    VAD Patient? No    PAD/SET Patient? No      Pain Assessment   Currently in Pain? No/denies    Multiple Pain Sites No             Capillary Blood Glucose: No results found for this or any previous visit (from the past 24 hour(s)).    Social History   Tobacco Use  Smoking Status Never  Smokeless Tobacco Never  Tobacco Comments   Never smoke 04/10/22    Goals Met:  Proper associated with RPD/PD & O2 Sat Independence with exercise equipment Exercise tolerated well No report of concerns or symptoms today Strength training completed today  Goals Unmet:  Not Applicable  Comments: Service time is from 1312 to 1441.    Dr. Mechele Collin is Medical Director for Pulmonary Rehab at [facility].

## 2022-08-23 ENCOUNTER — Other Ambulatory Visit (HOSPITAL_BASED_OUTPATIENT_CLINIC_OR_DEPARTMENT_OTHER): Payer: Self-pay

## 2022-08-23 MED ORDER — COMIRNATY 30 MCG/0.3ML IM SUSY
PREFILLED_SYRINGE | INTRAMUSCULAR | 0 refills | Status: DC
  Filled 2022-08-23: qty 0.3, 1d supply, fill #0

## 2022-08-26 DIAGNOSIS — H40013 Open angle with borderline findings, low risk, bilateral: Secondary | ICD-10-CM | POA: Diagnosis not present

## 2022-08-26 DIAGNOSIS — Z961 Presence of intraocular lens: Secondary | ICD-10-CM | POA: Diagnosis not present

## 2022-08-26 DIAGNOSIS — H02401 Unspecified ptosis of right eyelid: Secondary | ICD-10-CM | POA: Diagnosis not present

## 2022-08-26 DIAGNOSIS — H04123 Dry eye syndrome of bilateral lacrimal glands: Secondary | ICD-10-CM | POA: Diagnosis not present

## 2022-08-26 DIAGNOSIS — H43813 Vitreous degeneration, bilateral: Secondary | ICD-10-CM | POA: Diagnosis not present

## 2022-08-26 NOTE — Progress Notes (Signed)
[facility] Summary Report / Loop Recorder 

## 2022-08-27 ENCOUNTER — Encounter (HOSPITAL_COMMUNITY)
Admission: RE | Admit: 2022-08-27 | Discharge: 2022-08-27 | Disposition: A | Discharge status: Home / Self Care | Payer: Medicare Other | Source: Ambulatory Visit | Attending: Cardiology | Admitting: Cardiology

## 2022-08-27 DIAGNOSIS — I5022 Chronic systolic (congestive) heart failure: Secondary | ICD-10-CM | POA: Insufficient documentation

## 2022-08-27 NOTE — Progress Notes (Signed)
Discharge Progress Report  Patient Details  Name: Sean Stanley MRN: [id number] Date of Birth: 1941/02/23 Referring Provider:   Doristine Devoid Pulmonary Rehab Walk Test from 05/31/2022 in [facility]  Referring Provider Pemberton        Number of Visits: 22  Reason for Discharge:  Patient has met program and personal goals.  Smoking History:  Social History   Tobacco Use  Smoking Status Never  Smokeless Tobacco Never  Tobacco Comments   Never smoke 04/10/22    Diagnosis:  Heart failure, chronic systolic (HCC)  ADL UCSD:  Pulmonary Assessment Scores     Row Name 05/31/22 0932 08/15/22 1533 08/27/22 1520     ADL UCSD   ADL Phase Entry Exit Exit   SOB Score total 7 7 --     CAT Score   CAT Score 0 3 --     mMRC Score   mMRC Score 2 -- 1            Initial Exercise Prescription:  Initial Exercise Prescription - 05/31/22 1000       Date of Initial Exercise RX and Referring Provider   Date 05/31/22    Referring Provider Pemberton    Expected Discharge Date 08/27/22      Arm Ergometer   Level 1    Watts 5    RPM 20    Minutes 15      Elliptical   Level 1    Speed 1    Minutes 15      Prescription Details   Frequency (times per week) 2    Duration Progress to 30 minutes of continuous aerobic without signs/symptoms of physical distress      Intensity   THRR 40-80% of Max Heartrate 56-111    Ratings of Perceived Exertion 11-13    Perceived Dyspnea 0-4      Progression   Progression Continue progressive overload as per policy without signs/symptoms or physical distress.      Resistance Training   Training Prescription Yes    Weight blue bands    Reps 10-15             Discharge Exercise Prescription (Final Exercise Prescription Changes):  Exercise Prescription Changes - 08/20/22 1500       Response to Exercise   Blood Pressure (Admit) 108/56    Blood Pressure (Exercise)  164/80    Blood Pressure (Exit) 100/54    Heart Rate (Admit) 54 bpm    Heart Rate (Exercise) 69 bpm    Heart Rate (Exit) 55 bpm    Oxygen Saturation (Admit) 97 %    Oxygen Saturation (Exercise) 95 %    Oxygen Saturation (Exit) 96 %    Rating of Perceived Exertion (Exercise) 13    Perceived Dyspnea (Exercise) 0    Duration Continue with 30 min of aerobic exercise without signs/symptoms of physical distress.    Intensity THRR unchanged      Progression   Progression Continue to progress workloads to maintain intensity without signs/symptoms of physical distress.      Resistance Training   Training Prescription Yes    Weight black bands    Reps 10-15    Time 10 Minutes      Arm Ergometer   Level 5    Minutes 15    METs 3.5      Track   Laps 15    Minutes 15  METs 3.31             Functional Capacity:  6 Minute Walk     Row Name 05/31/22 1036 08/27/22 1515       6 Minute Walk   Phase Initial Discharge    Distance 1200 feet 1680 feet    Distance % Change -- 40 %    Distance Feet Change -- 480 ft    Walk Time 6 minutes 6 minutes    # of Rest Breaks 0 0    MPH 2.27 3.18    METS 1.79 2.96    RPE 10 11    Perceived Dyspnea  0 0    VO2 Peak 6.25 10.36    Symptoms No Yes (comment)  right knee 3/10    Resting HR 63 bpm 53 bpm    Resting BP 102/60 120/62    Resting Oxygen Saturation  96 % 94 %    Exercise Oxygen Saturation  during 6 min walk 95 % 97 %    Max Ex. HR 75 bpm 82 bpm    Max Ex. BP 128/66 150/60    2 Minute Post BP 120/66 142/62      Interval HR   1 Minute HR 67 69    2 Minute HR 71 73    3 Minute HR 71 79    4 Minute HR 75 82    5 Minute HR 74 78    6 Minute HR 74 78    2 Minute Post HR 74 62    Interval Heart Rate? Yes Yes      Interval Oxygen   Interval Oxygen? Yes Yes    Baseline Oxygen Saturation % 96 % 94 %    1 Minute Oxygen Saturation % 95 % 96 %    1 Minute Liters of Oxygen 0 L 0 L    2 Minute Oxygen Saturation % 95 % 94 %    2  Minute Liters of Oxygen 0 L 0 L    3 Minute Oxygen Saturation % 95 % 95 %    3 Minute Liters of Oxygen 0 L 0 L    4 Minute Oxygen Saturation % 95 % 96 %    4 Minute Liters of Oxygen 0 L 0 L    5 Minute Oxygen Saturation % 95 % 96 %    5 Minute Liters of Oxygen 0 L 0 L    6 Minute Oxygen Saturation % 95 % 96 %    6 Minute Liters of Oxygen 0 L 0 L    2 Minute Post Oxygen Saturation % 96 % 97 %    2 Minute Post Liters of Oxygen 0 L 0 L             Psychological, QOL, Others - Outcomes: PHQ 2/9:    08/15/2022    3:35 PM 05/31/2022    9:22 AM  Depression screen PHQ 2/9  Decreased Interest 0 0  Down, Depressed, Hopeless 0 0  PHQ - 2 Score 0 0  Altered sleeping 3 0  Tired, decreased energy 1 0  Change in appetite 0 0  Feeling bad or failure about yourself  0 0  Trouble concentrating 0 0  Moving slowly or fidgety/restless 0 0  Suicidal thoughts 0 0  PHQ-9 Score 4 0  Difficult doing work/chores Not difficult at all Not difficult at all    Quality of Life:   Personal Goals: Goals established at orientation with  interventions provided to work toward goal.  Personal Goals and Risk Factors at Admission - 05/31/22 0928       Core Components/Risk Factors/Patient Goals on Admission    Weight Management Yes;Weight Loss    Intervention Weight Management: Develop a combined nutrition and exercise program designed to reach desired caloric intake, while maintaining appropriate intake of nutrient and fiber, sodium and fats, and appropriate energy expenditure required for the weight goal.;Weight Management: Provide education and appropriate resources to help participant work on and attain dietary goals.;Weight Management/Obesity: Establish reasonable short term and long term weight goals.    Expected Outcomes Short Term: Continue to assess and modify interventions until short term weight is achieved;Long Term: Adherence to nutrition and physical activity/exercise program aimed toward  attainment of established weight goal;Weight Maintenance: Understanding of the daily nutrition guidelines, which includes 25-35% calories from fat, 7% or less cal from saturated fats, less than 200mg  cholesterol, less than 1.5gm of sodium, & 5 or more servings of fruits and vegetables daily;Weight Loss: Understanding of general recommendations for a balanced deficit meal plan, which promotes 1-2 lb weight loss per week and includes a negative energy balance of [phone] kcal/d;Understanding recommendations for meals to include 15-35% energy as protein, 25-35% energy from fat, 35-60% energy from carbohydrates, less than 200mg  of dietary cholesterol, 20-35 gm of total fiber daily;Understanding of distribution of calorie intake throughout the day with the consumption of 4-5 meals/snacks    Improve shortness of breath with ADL's Yes    Intervention Provide education, individualized exercise plan and daily activity instruction to help decrease symptoms of SOB with activities of daily living.    Expected Outcomes Short Term: Improve cardiorespiratory fitness to achieve a reduction of symptoms when performing ADLs;Long Term: Be able to perform more ADLs without symptoms or delay the onset of symptoms              Personal Goals Discharge:  Goals and Risk Factor Review     Row Name 05/31/22 1520 07/01/22 1428 07/29/22 1108         Core Components/Risk Factors/Patient Goals Review   Personal Goals Review Improve shortness of breath with ADL's;Develop more efficient breathing techniques such as purse lipped breathing and diaphragmatic breathing and practicing self-pacing with activity.;Weight Management/Obesity Improve shortness of breath with ADL's;Develop more efficient breathing techniques such as purse lipped breathing and diaphragmatic breathing and practicing self-pacing with activity.;Weight Management/Obesity Weight Management/Obesity;Improve shortness of breath with ADL's     Review Jesusita Oka is  scheduled to start PR on 3/14 Jesusita Oka stated that after moving into a retirement facility his weight has gone up. He stated that he gets his meals served to him and it's always 4 courses. He said his wife and him also like to social with friends with drinks and snacks. Jesusita Oka made a goal of weight loss but has maintained since starting. He is working with our dietitian on achieving weight loss. He has decreased his shortness of breath and can use pursed lip breathing without being prompted. Jesusita Oka has completed 7 classes so far. He is exercising on the arm egrometer and the seated elliptical. He is making great improvements on both machines increasing his workload and METs. Jesusita Oka likes coming to class and speaking with other veterans. Jesusita Oka is making progress on his goal of losing weight. Jesusita Oka is currently working with dietician on weight loss goals (see dietician note).  Jesusita Oka is able to demonstrate proper breathing techniques such as purse lip breathing, diaphragmatic breathing and self pacing.  Expected Outcomes See admission goals For Dan to lose weight, improve his shortness of breath, develop more efficient breathing techniques, and any other goals set during orientation. See admission goals              Exercise Goals and Review:  Exercise Goals     Row Name 05/31/22 0934 06/04/22 0911 06/25/22 0931 07/30/22 0918       Exercise Goals   Increase Physical Activity Yes Yes Yes Yes    Intervention Provide advice, education, support and counseling about physical activity/exercise needs.;Develop an individualized exercise prescription for aerobic and resistive training based on initial evaluation findings, risk stratification, comorbidities and participant's personal goals. Provide advice, education, support and counseling about physical activity/exercise needs.;Develop an individualized exercise prescription for aerobic and resistive training based on initial evaluation findings, risk stratification,  comorbidities and participant's personal goals. Provide advice, education, support and counseling about physical activity/exercise needs.;Develop an individualized exercise prescription for aerobic and resistive training based on initial evaluation findings, risk stratification, comorbidities and participant's personal goals. Provide advice, education, support and counseling about physical activity/exercise needs.;Develop an individualized exercise prescription for aerobic and resistive training based on initial evaluation findings, risk stratification, comorbidities and participant's personal goals.    Expected Outcomes Short Term: Attend rehab on a regular basis to increase amount of physical activity.;Long Term: Add in home exercise to make exercise part of routine and to increase amount of physical activity.;Long Term: Exercising regularly at least 3-5 days a week. Short Term: Attend rehab on a regular basis to increase amount of physical activity.;Long Term: Add in home exercise to make exercise part of routine and to increase amount of physical activity.;Long Term: Exercising regularly at least 3-5 days a week. Short Term: Attend rehab on a regular basis to increase amount of physical activity.;Long Term: Add in home exercise to make exercise part of routine and to increase amount of physical activity.;Long Term: Exercising regularly at least 3-5 days a week. Short Term: Attend rehab on a regular basis to increase amount of physical activity.;Long Term: Add in home exercise to make exercise part of routine and to increase amount of physical activity.;Long Term: Exercising regularly at least 3-5 days a week.    Increase Strength and Stamina Yes Yes Yes Yes    Intervention Provide advice, education, support and counseling about physical activity/exercise needs.;Develop an individualized exercise prescription for aerobic and resistive training based on initial evaluation findings, risk stratification,  comorbidities and participant's personal goals. Provide advice, education, support and counseling about physical activity/exercise needs.;Develop an individualized exercise prescription for aerobic and resistive training based on initial evaluation findings, risk stratification, comorbidities and participant's personal goals. Provide advice, education, support and counseling about physical activity/exercise needs.;Develop an individualized exercise prescription for aerobic and resistive training based on initial evaluation findings, risk stratification, comorbidities and participant's personal goals. Provide advice, education, support and counseling about physical activity/exercise needs.;Develop an individualized exercise prescription for aerobic and resistive training based on initial evaluation findings, risk stratification, comorbidities and participant's personal goals.    Expected Outcomes Short Term: Increase workloads from initial exercise prescription for resistance, speed, and METs.;Short Term: Perform resistance training exercises routinely during rehab and add in resistance training at home;Long Term: Improve cardiorespiratory fitness, muscular endurance and strength as measured by increased METs and functional capacity ( ) Short Term: Increase workloads from initial exercise prescription for resistance, speed, and METs.;Short Term: Perform resistance training exercises routinely during rehab and add in resistance training at home;Long Term: Improve cardiorespiratory  fitness, muscular endurance and strength as measured by increased METs and functional capacity ( ) Short Term: Increase workloads from initial exercise prescription for resistance, speed, and METs.;Short Term: Perform resistance training exercises routinely during rehab and add in resistance training at home;Long Term: Improve cardiorespiratory fitness, muscular endurance and strength as measured by increased METs and functional  capacity ( ) Short Term: Increase workloads from initial exercise prescription for resistance, speed, and METs.;Short Term: Perform resistance training exercises routinely during rehab and add in resistance training at home;Long Term: Improve cardiorespiratory fitness, muscular endurance and strength as measured by increased METs and functional capacity ( )    Able to understand and use rate of perceived exertion (RPE) scale Yes Yes Yes Yes    Intervention Provide education and explanation on how to use RPE scale Provide education and explanation on how to use RPE scale Provide education and explanation on how to use RPE scale Provide education and explanation on how to use RPE scale    Expected Outcomes Short Term: Able to use RPE daily in rehab to express subjective intensity level;Long Term:  Able to use RPE to guide intensity level when exercising independently Short Term: Able to use RPE daily in rehab to express subjective intensity level;Long Term:  Able to use RPE to guide intensity level when exercising independently Short Term: Able to use RPE daily in rehab to express subjective intensity level;Long Term:  Able to use RPE to guide intensity level when exercising independently Short Term: Able to use RPE daily in rehab to express subjective intensity level;Long Term:  Able to use RPE to guide intensity level when exercising independently    Knowledge and understanding of Target Heart Rate Range (THRR) Yes Yes Yes Yes    Intervention Provide education and explanation of THRR including how the numbers were predicted and where they are located for reference Provide education and explanation of THRR including how the numbers were predicted and where they are located for reference Provide education and explanation of THRR including how the numbers were predicted and where they are located for reference Provide education and explanation of THRR including how the numbers were predicted and where they  are located for reference    Expected Outcomes Short Term: Able to state/look up THRR;Long Term: Able to use THRR to govern intensity when exercising independently;Short Term: Able to use daily as guideline for intensity in rehab Short Term: Able to state/look up THRR;Long Term: Able to use THRR to govern intensity when exercising independently;Short Term: Able to use daily as guideline for intensity in rehab Short Term: Able to state/look up THRR;Long Term: Able to use THRR to govern intensity when exercising independently;Short Term: Able to use daily as guideline for intensity in rehab Short Term: Able to state/look up THRR;Long Term: Able to use THRR to govern intensity when exercising independently;Short Term: Able to use daily as guideline for intensity in rehab    Understanding of Exercise Prescription Yes Yes Yes Yes    Intervention Provide education, explanation, and written materials on patient's individual exercise prescription Provide education, explanation, and written materials on patient's individual exercise prescription Provide education, explanation, and written materials on patient's individual exercise prescription Provide education, explanation, and written materials on patient's individual exercise prescription    Expected Outcomes Short Term: Able to explain program exercise prescription;Long Term: Able to explain home exercise prescription to exercise independently Short Term: Able to explain program exercise prescription;Long Term: Able to explain home exercise prescription to exercise independently Short Term:  Able to explain program exercise prescription;Long Term: Able to explain home exercise prescription to exercise independently Short Term: Able to explain program exercise prescription;Long Term: Able to explain home exercise prescription to exercise independently             Exercise Goals Re-Evaluation:  Exercise Goals Re-Evaluation     Row Name 06/04/22 0911 06/25/22  0931 07/30/22 0918         Exercise Goal Re-Evaluation   Exercise Goals Review Increase Physical Activity;Able to understand and use Dyspnea scale;Understanding of Exercise Prescription;Increase Strength and Stamina;Knowledge and understanding of Target Heart Rate Range (THRR);Able to understand and use rate of perceived exertion (RPE) scale Increase Physical Activity;Able to understand and use Dyspnea scale;Understanding of Exercise Prescription;Increase Strength and Stamina;Knowledge and understanding of Target Heart Rate Range (THRR);Able to understand and use rate of perceived exertion (RPE) scale Increase Physical Activity;Able to understand and use Dyspnea scale;Understanding of Exercise Prescription;Increase Strength and Stamina;Knowledge and understanding of Target Heart Rate Range (THRR);Able to understand and use rate of perceived exertion (RPE) scale     Comments Pt is to begin exercise on 3/14. Will monitor for progression. Pt has exercised 5 sessions. He is exercising on the arm ergometer for 15 min, level 4, 16 watts, RPM 53. He then exercises on the recumbent elliptical for 15 min, level 3, METs 3.6. He is tolerating progression well and quickly. Performs warm up and cool down independently without limitations.  Will monitor for progression. Pt has exercised 13 sessions. He is exercising on the arm ergometer for 15 min, level 5, 18 watts, RPM 53. He then exercises on the recumbent elliptical for 15 min, level 7, METs 5.8. He is tolerating progression well and quickly. Performs warm up and cool down independently without limitations, uses black resistance bands.  Will monitor for progression.     Expected Outcomes Through exercise at rehab and home the patient will decrease shortness of breath with daily activities and feel confident in carrying out an exercise regimn at home Through exercise at rehab and home the patient will decrease shortness of breath with daily activities and feel confident  in carrying out an exercise regimn at home Through exercise at rehab and home the patient will decrease shortness of breath with daily activities and feel confident in carrying out an exercise regimn at home              Nutrition & Weight - Outcomes:  Pre Biometrics - 05/31/22 0916       Pre Biometrics   Grip Strength 48 kg             Post Biometrics - 08/27/22 1520        Post  Biometrics   Grip Strength 52 kg             Nutrition:  Nutrition Therapy & Goals - 07/30/22 1502       Nutrition Therapy   Diet Heart Healthy Diet    Drug/Food Interactions Statins/Certain Fruits      Personal Nutrition Goals   Nutrition Goal Patient to improve diet quality by using the plate method as a guide for meal planning to include lean protein/plant protein, fruits, vegetables, whole grains, nonfat dairy as part of a well-balanced diet.    Personal Goal #2 Patient to identify strategies for weight loff of 0.5-2.0# per week.    Comments Goals in action. He reports increased appetite and poor sleep since starting amiodarone. He is motivated to lose weight to  210-200#. He has made an effort to reduce sweets, carbohydrate portions, and sugary beverages to aid with weight loss. He is down 5.3# since starting with our program. He will benefit from partication in pulmonary rehab for nutrition, exercise, and lifestyle modification.      Intervention Plan   Intervention Prescribe, educate and counsel regarding individualized specific dietary modifications aiming towards targeted core components such as weight, hypertension, lipid management, diabetes, heart failure and other comorbidities.;Nutrition handout(s) given to patient.    Expected Outcomes Short Term Goal: Understand basic principles of dietary content, such as calories, fat, sodium, cholesterol and nutrients.;Long Term Goal: Adherence to prescribed nutrition plan.             Nutrition Discharge:  Nutrition Assessments -  08/16/22 1211       Rate Your Plate Scores   Post Score 72             Education Questionnaire Score:  Knowledge Questionnaire Score - 08/15/22 1535       Knowledge Questionnaire Score   Post Score 17/18             Goals reviewed with patient; copy given to patient.

## 2022-08-27 NOTE — Progress Notes (Signed)
Daily Session Note  Patient Details  Name: Sean Stanley MRN: [id number] Date of Birth: 1940-08-28 Referring Provider:   Doristine Devoid Pulmonary Rehab Walk Test from 05/31/2022 in [facility]  Referring Provider Shari Prows       Encounter Date: 08/27/2022  Check In:  Session Check In - 08/27/22 1418       Check-In   Supervising physician immediately available to respond to emergencies [facility] MD immediately available    Physician(s) Carlyon Shadow, NP    Location [facility]    Staff Present Samantha Belarus, RD, LDN;Camille Thau Dionisio Paschal, ACSM-CEP, Exercise Physiologist;Kaylee Earlene Plater, MS, ACSM-CEP, Exercise Physiologist;David Manus Gunning, MS, ACSM-CEP, CCRP, Exercise Physiologist;Casey Katrinka Blazing, RT    Virtual Visit No    Medication changes reported     No    Fall or balance concerns reported    No    Tobacco Cessation No Change    Warm-up and Cool-down Not performed (comment)    Resistance Training Performed No    VAD Patient? No    PAD/SET Patient? No      Pain Assessment   Currently in Pain? No/denies    Multiple Pain Sites No             Capillary Blood Glucose: No results found for this or any previous visit (from the past 24 hour(s)).    Social History   Tobacco Use  Smoking Status Never  Smokeless Tobacco Never  Tobacco Comments   Never smoke 04/10/22    Goals Met:  Exercise tolerated well Personal goals reviewed No report of concerns or symptoms today  Goals Unmet:  Not Applicable  Comments: Pt performed 6 min walk test and graduated. Great success in program. Service time is from 1255 to 48.    Dr. Mechele Collin is Medical Director for Pulmonary Rehab at [facility].

## 2022-08-28 DIAGNOSIS — Z23 Encounter for immunization: Secondary | ICD-10-CM | POA: Diagnosis not present

## 2022-09-02 ENCOUNTER — Ambulatory Visit (INDEPENDENT_AMBULATORY_CARE_PROVIDER_SITE_OTHER): Payer: Medicare Other

## 2022-09-02 DIAGNOSIS — I480 Paroxysmal atrial fibrillation: Secondary | ICD-10-CM | POA: Diagnosis not present

## 2022-09-02 DIAGNOSIS — N401 Enlarged prostate with lower urinary tract symptoms: Secondary | ICD-10-CM | POA: Diagnosis not present

## 2022-09-02 DIAGNOSIS — R351 Nocturia: Secondary | ICD-10-CM | POA: Diagnosis not present

## 2022-09-02 DIAGNOSIS — R972 Elevated prostate specific antigen [PSA]: Secondary | ICD-10-CM | POA: Diagnosis not present

## 2022-09-02 LAB — CUP PACEART REMOTE DEVICE CHECK
Date Time Interrogation Session: 20240609230727
Implantable Pulse Generator Implant Date: 20230105

## 2022-09-03 DIAGNOSIS — R972 Elevated prostate specific antigen [PSA]: Secondary | ICD-10-CM | POA: Diagnosis not present

## 2022-09-06 ENCOUNTER — Other Ambulatory Visit: Payer: Medicare Other

## 2022-09-12 ENCOUNTER — Other Ambulatory Visit (HOSPITAL_COMMUNITY): Payer: Medicare Other

## 2022-09-19 ENCOUNTER — Ambulatory Visit: Payer: Medicare Other | Attending: Cardiology

## 2022-09-19 DIAGNOSIS — I4819 Other persistent atrial fibrillation: Secondary | ICD-10-CM

## 2022-09-20 LAB — BASIC METABOLIC PANEL
BUN/Creatinine Ratio: 23 (ref 10–24)
BUN: 34 mg/dL — ABNORMAL HIGH (ref 8–27)
CO2: 22 mmol/L (ref 20–29)
Calcium: 8.8 mg/dL (ref 8.6–10.2)
Chloride: 103 mmol/L (ref 96–106)
Creatinine, Ser: 1.46 mg/dL — ABNORMAL HIGH (ref 0.76–1.27)
Glucose: 89 mg/dL (ref 70–99)
Potassium: 4.8 mmol/L (ref 3.5–5.2)
Sodium: 140 mmol/L (ref 134–144)
eGFR: 48 mL/min/{1.73_m2} — ABNORMAL LOW (ref >59)

## 2022-09-20 LAB — CBC
Hematocrit: 41.1 % (ref 37.5–51.0)
Hemoglobin: 13.1 g/dL (ref 13.0–17.7)
MCH: 30 pg (ref 26.6–33.0)
MCHC: 31.9 g/dL (ref 31.5–35.7)
MCV: 94 fL (ref 79–97)
Platelets: 171 10*3/uL (ref 150–450)
RBC: 4.36 x10E6/uL (ref 4.14–5.80)
RDW: 12.6 % (ref 11.6–15.4)
WBC: 6.4 10*3/uL (ref 3.4–10.8)

## 2022-09-23 NOTE — Progress Notes (Signed)
[facility] Summary Report / Loop Recorder 

## 2022-10-01 ENCOUNTER — Ambulatory Visit (HOSPITAL_COMMUNITY)
Admission: RE | Admit: 2022-10-01 | Discharge: 2022-10-01 | Disposition: A | Discharge status: Home / Self Care | Payer: Medicare Other | Source: Ambulatory Visit | Attending: Cardiology | Admitting: Cardiology

## 2022-10-01 DIAGNOSIS — I4819 Other persistent atrial fibrillation: Secondary | ICD-10-CM | POA: Diagnosis not present

## 2022-10-01 MED ORDER — IOHEXOL 350 MG/ML SOLN
95.0000 mL | Freq: Once | INTRAVENOUS | Status: AC | PRN
  Administered 2022-10-01: 95 mL via INTRAVENOUS

## 2022-10-07 ENCOUNTER — Ambulatory Visit: Payer: Medicare Other

## 2022-10-07 DIAGNOSIS — I480 Paroxysmal atrial fibrillation: Secondary | ICD-10-CM | POA: Diagnosis not present

## 2022-10-07 LAB — CUP PACEART REMOTE DEVICE CHECK
Date Time Interrogation Session: 20240712230447
Implantable Pulse Generator Implant Date: 20230105

## 2022-10-07 NOTE — Pre-Procedure Instructions (Signed)
Instructed patient on the following items: Arrival time 0800 Nothing to eat or drink after midnight No meds AM of procedure Responsible person to drive you home and stay with you for 24 hrs  Have you missed any doses of anti-coagulant Eliquis- takes twice a day, hasn't missed any doses.  Don't take dose in the morning.

## 2022-10-07 NOTE — Anesthesia Preprocedure Evaluation (Addendum)
Anesthesia Evaluation  Patient identified by MRN, date of birth, ID band Patient awake    Reviewed: Allergy & Precautions, NPO status , Patient's Chart, lab work & pertinent test results, reviewed documented beta blocker date and time   History of Anesthesia Complications (+) DIFFICULT AIRWAY and history of anesthetic complications  Airway Mallampati: II  TM Distance: >3 FB Neck ROM: Full    Dental  (+) Dental Advisory Given, Teeth Intact   Pulmonary sleep apnea , pneumonia   Pulmonary exam normal breath sounds clear to auscultation       Cardiovascular Pt. on home beta blockers + CAD and + Peripheral Vascular Disease  Normal cardiovascular exam+ dysrhythmias  Rhythm:Regular Rate:Normal  09/2022 CT Coronary Ca2+ 1. There is normal pulmonary vein drainage into the left atrium with ostial measurements above. 2. There is no thrombus in the left atrial appendage. 3. The esophagus runs in proximity to the ostium of the left lower pulmonary vein. 4. No PFO/ASD. 5. Normal coronary origin. Right dominance. 6. CAC score of 602 which is 57th percentile for age-, race-, and sex-matched controls.   Echo 10/2021  1. Left ventricular ejection fraction, by estimation, is 50 to 55%. The left ventricle has normal function. The left ventricle has no regional wall motion abnormalities. Left ventricular diastolic function could not be evaluated.   2. Right ventricular systolic function is normal. The right ventricular size is normal. There is normal pulmonary artery systolic pressure.   3. Left atrial size was severely dilated.   4. Right atrial size was severely dilated.   5. The mitral valve has been repaired/replaced. No evidence of mitral valve regurgitation. No evidence of mitral stenosis. Procedure Date: 2002.   6. The aortic valve is tricuspid. Aortic valve regurgitation is not visualized. Aortic valve sclerosis is present, with no evidence of  aortic valve stenosis.   7. There is moderate dilatation of the aortic root, measuring 40 mm. There is moderate dilatation of the ascending aorta, measuring 44 mm.   8. The inferior vena cava is normal in size with greater than 50% respiratory variability, suggesting right atrial pressure of 3 mmHg.     Echo 10/2020 1. Left ventricular ejection fraction, by estimation, is 50 to 55%. Left ventricular ejection fraction by 3D volume is 48%. The left ventricle has mildly decreased function. The left ventricle demonstrates global hypokinesis. Left ventricular diastolic parameters are consistent with Grade I diastolic dysfunction (impaired relaxation). Elevated left ventricular end-diastolic pressure. The average left ventricular global longitudinal strain is 18.8 %. The global longitudinal strain is normal.  2. Right ventricular systolic function is low normal. The right ventricular size is normal. There is normal pulmonary artery systolic pressure.  3. Left atrial size was mild to moderately dilated.  4. Right atrial size was mildly dilated.  5. The mitral valve has been repaired/replaced. Trivial mitral valve regurgitation. The mean mitral valve gradient is 1.0 mmHg with average heart rate of 58 bpm. There is a prosthetic annuloplasty ring present in the mitral position. Procedure Date: 2002.  6. The aortic valve is tricuspid. Aortic valve regurgitation is not visualized. Mild aortic valve sclerosis is present, with no evidence of aortic valve stenosis.  7. Aortic dilatation noted. There is mild dilatation of the aortic root, measuring 39 mm. There is mild dilatation of the ascending aorta, measuring 41 mm.    Neuro/Psych  Headaches  Neuromuscular disease negative neurological ROS     GI/Hepatic negative GI ROS, Neg liver ROS,,,  Endo/Other  Renal/GU Renal disease     Musculoskeletal negative musculoskeletal ROS (+)    Abdominal   Peds  Hematology  (+) Blood dyscrasia, anemia    Anesthesia Other Findings   Reproductive/Obstetrics                             Anesthesia Physical Anesthesia Plan  ASA: 3  Anesthesia Plan: General   Post-op Pain Management: Tylenol PO (pre-op)*   Induction: Intravenous  PONV Risk Score and Plan: 2 and Treatment may vary due to age or medical condition, Ondansetron and Dexamethasone  Airway Management Planned: Oral ETT and Video Laryngoscope Planned  Additional Equipment: None  Intra-op Plan:   Post-operative Plan: Extubation in OR  Informed Consent: I have reviewed the patients History and Physical, chart, labs and discussed the procedure including the risks, benefits and alternatives for the proposed anesthesia with the patient or authorized representative who has indicated his/her understanding and acceptance.     Dental advisory given  Plan Discussed with: CRNA  Anesthesia Plan Comments:         Anesthesia Quick Evaluation

## 2022-10-08 ENCOUNTER — Other Ambulatory Visit: Payer: Self-pay

## 2022-10-08 ENCOUNTER — Ambulatory Visit (HOSPITAL_COMMUNITY)
Admission: RE | Admit: 2022-10-08 | Discharge: 2022-10-08 | Disposition: A | Discharge status: Home / Self Care | Payer: Medicare Other | Attending: Cardiology | Admitting: Cardiology

## 2022-10-08 ENCOUNTER — Ambulatory Visit (HOSPITAL_BASED_OUTPATIENT_CLINIC_OR_DEPARTMENT_OTHER): Payer: Medicare Other | Admitting: Anesthesiology

## 2022-10-08 ENCOUNTER — Ambulatory Visit (HOSPITAL_COMMUNITY)
Admission: RE | Disposition: A | Discharge status: Home / Self Care | Payer: Self-pay | Source: Home / Self Care | Attending: Cardiology

## 2022-10-08 ENCOUNTER — Ambulatory Visit (HOSPITAL_COMMUNITY): Payer: Medicare Other | Admitting: Anesthesiology

## 2022-10-08 DIAGNOSIS — I4892 Unspecified atrial flutter: Secondary | ICD-10-CM | POA: Diagnosis not present

## 2022-10-08 DIAGNOSIS — E785 Hyperlipidemia, unspecified: Secondary | ICD-10-CM | POA: Diagnosis not present

## 2022-10-08 DIAGNOSIS — I4891 Unspecified atrial fibrillation: Secondary | ICD-10-CM | POA: Diagnosis not present

## 2022-10-08 DIAGNOSIS — I10 Essential (primary) hypertension: Secondary | ICD-10-CM | POA: Diagnosis not present

## 2022-10-08 DIAGNOSIS — Z8679 Personal history of other diseases of the circulatory system: Secondary | ICD-10-CM | POA: Diagnosis not present

## 2022-10-08 DIAGNOSIS — Z8249 Family history of ischemic heart disease and other diseases of the circulatory system: Secondary | ICD-10-CM | POA: Diagnosis not present

## 2022-10-08 DIAGNOSIS — I484 Atypical atrial flutter: Secondary | ICD-10-CM | POA: Diagnosis not present

## 2022-10-08 DIAGNOSIS — E7800 Pure hypercholesterolemia, unspecified: Secondary | ICD-10-CM | POA: Diagnosis not present

## 2022-10-08 DIAGNOSIS — I713 Abdominal aortic aneurysm, ruptured, unspecified: Secondary | ICD-10-CM | POA: Diagnosis not present

## 2022-10-08 DIAGNOSIS — G4733 Obstructive sleep apnea (adult) (pediatric): Secondary | ICD-10-CM | POA: Insufficient documentation

## 2022-10-08 DIAGNOSIS — I4819 Other persistent atrial fibrillation: Secondary | ICD-10-CM | POA: Diagnosis not present

## 2022-10-08 DIAGNOSIS — I2510 Atherosclerotic heart disease of native coronary artery without angina pectoris: Secondary | ICD-10-CM | POA: Diagnosis not present

## 2022-10-08 DIAGNOSIS — I480 Paroxysmal atrial fibrillation: Secondary | ICD-10-CM

## 2022-10-08 DIAGNOSIS — D649 Anemia, unspecified: Secondary | ICD-10-CM | POA: Diagnosis not present

## 2022-10-08 HISTORY — PX: ATRIAL FIBRILLATION ABLATION: EP1191

## 2022-10-08 LAB — POCT ACTIVATED CLOTTING TIME: Activated Clotting Time: 281 seconds

## 2022-10-08 SURGERY — ATRIAL FIBRILLATION ABLATION
Anesthesia: General

## 2022-10-08 MED ORDER — ROCURONIUM BROMIDE 10 MG/ML (PF) SYRINGE
PREFILLED_SYRINGE | INTRAVENOUS | Status: DC | PRN
  Administered 2022-10-08: 70 mg via INTRAVENOUS

## 2022-10-08 MED ORDER — PROPOFOL 10 MG/ML IV BOLUS
INTRAVENOUS | Status: DC | PRN
  Administered 2022-10-08: 30 mg via INTRAVENOUS
  Administered 2022-10-08: 120 mg via INTRAVENOUS

## 2022-10-08 MED ORDER — PHENYLEPHRINE HCL (PRESSORS) 10 MG/ML IV SOLN
INTRAVENOUS | Status: DC | PRN
  Administered 2022-10-08: 80 ug via INTRAVENOUS
  Administered 2022-10-08 (×3): 160 ug via INTRAVENOUS

## 2022-10-08 MED ORDER — HEPARIN SODIUM (PORCINE) 1000 UNIT/ML IJ SOLN
INTRAMUSCULAR | Status: DC | PRN
  Administered 2022-10-08: 6000 [IU] via INTRAVENOUS
  Administered 2022-10-08: 14000 [IU] via INTRAVENOUS

## 2022-10-08 MED ORDER — DOBUTAMINE INFUSION FOR EP/ECHO/NUC (1000 MCG/ML)
INTRAVENOUS | Status: AC
  Filled 2022-10-08: qty 250

## 2022-10-08 MED ORDER — LIDOCAINE 2% (20 MG/ML) 5 ML SYRINGE
INTRAMUSCULAR | Status: DC | PRN
  Administered 2022-10-08: 100 mg via INTRAVENOUS

## 2022-10-08 MED ORDER — SODIUM CHLORIDE 0.9 % IV SOLN: INTRAVENOUS | Status: DC

## 2022-10-08 MED ORDER — ONDANSETRON HCL 4 MG/2ML IJ SOLN
INTRAMUSCULAR | Status: DC | PRN
  Administered 2022-10-08: 4 mg via INTRAVENOUS

## 2022-10-08 MED ORDER — HEPARIN (PORCINE) IN NACL 1000-0.9 UT/500ML-% IV SOLN
INTRAVENOUS | Status: DC | PRN
  Administered 2022-10-08 (×4): 500 mL

## 2022-10-08 MED ORDER — EPHEDRINE SULFATE-NACL 50-0.9 MG/10ML-% IV SOSY
PREFILLED_SYRINGE | INTRAVENOUS | Status: DC | PRN
  Administered 2022-10-08: 5 mg via INTRAVENOUS

## 2022-10-08 MED ORDER — FENTANYL CITRATE (PF) 250 MCG/5ML IJ SOLN
INTRAMUSCULAR | Status: DC | PRN
  Administered 2022-10-08: 50 ug via INTRAVENOUS

## 2022-10-08 MED ORDER — ACETAMINOPHEN 325 MG PO TABS: 650.0000 mg | ORAL_TABLET | ORAL | Status: DC | PRN

## 2022-10-08 MED ORDER — SODIUM CHLORIDE 0.9 % IV SOLN: 250.0000 mL | INTRAVENOUS | Status: DC | PRN

## 2022-10-08 MED ORDER — DOBUTAMINE INFUSION FOR EP/ECHO/NUC (1000 MCG/ML)
INTRAVENOUS | Status: DC | PRN
  Administered 2022-10-08: 20 ug/kg/min via INTRAVENOUS

## 2022-10-08 MED ORDER — SODIUM CHLORIDE 0.9% FLUSH: 3.0000 mL | Freq: Two times a day (BID) | INTRAVENOUS | Status: DC

## 2022-10-08 MED ORDER — DEXAMETHASONE SODIUM PHOSPHATE 10 MG/ML IJ SOLN
INTRAMUSCULAR | Status: DC | PRN
  Administered 2022-10-08: 5 mg via INTRAVENOUS

## 2022-10-08 MED ORDER — SUGAMMADEX SODIUM 200 MG/2ML IV SOLN
INTRAVENOUS | Status: DC | PRN
  Administered 2022-10-08: 200 mg via INTRAVENOUS

## 2022-10-08 MED ORDER — SODIUM CHLORIDE 0.9% FLUSH: 3.0000 mL | INTRAVENOUS | Status: DC | PRN

## 2022-10-08 MED ORDER — ONDANSETRON HCL 4 MG/2ML IJ SOLN: 4.0000 mg | Freq: Four times a day (QID) | INTRAMUSCULAR | Status: DC | PRN

## 2022-10-08 MED ORDER — FENTANYL CITRATE (PF) 100 MCG/2ML IJ SOLN
INTRAMUSCULAR | Status: AC
  Filled 2022-10-08: qty 2

## 2022-10-08 MED ORDER — ACETAMINOPHEN 500 MG PO TABS
1000.0000 mg | ORAL_TABLET | Freq: Once | ORAL | Status: AC
  Administered 2022-10-08: 1000 mg via ORAL
  Filled 2022-10-08: qty 2

## 2022-10-08 MED ORDER — HEPARIN SODIUM (PORCINE) 1000 UNIT/ML IJ SOLN
INTRAMUSCULAR | Status: DC | PRN
  Administered 2022-10-08: 1000 [IU] via INTRAVENOUS

## 2022-10-08 MED ORDER — PROTAMINE SULFATE 10 MG/ML IV SOLN
INTRAVENOUS | Status: DC | PRN
  Administered 2022-10-08 (×2): 10 mg via INTRAVENOUS
  Administered 2022-10-08: 20 mg via INTRAVENOUS

## 2022-10-08 MED ORDER — PHENYLEPHRINE HCL-NACL 20-0.9 MG/250ML-% IV SOLN
INTRAVENOUS | Status: DC | PRN
  Administered 2022-10-08: 25 ug/min via INTRAVENOUS

## 2022-10-08 SURGICAL SUPPLY — 21 items
BAG SNAP BAND KOVER 36X36 (MISCELLANEOUS) IMPLANT
CATH 8FR REPROCESSED SOUNDSTAR (CATHETERS) ×1 IMPLANT
CATH 8FR SOUNDSTAR REPROCESSED (CATHETERS) IMPLANT
CATH ABLAT QDOT MICRO BI TC DF (CATHETERS) IMPLANT
CATH OCTARAY 2.0 F 3-3-3-3-3 (CATHETERS) IMPLANT
CATH PIGTAIL STEERABLE D1 8.7 (WIRE) IMPLANT
CATH S-M CIRCA TEMP PROBE (CATHETERS) IMPLANT
CATH WEB BI DIR CSDF CRV REPRO (CATHETERS) IMPLANT
CLOSURE MYNX CONTROL 6F/7F (Vascular Products) IMPLANT
CLOSURE PERCLOSE PROSTYLE (VASCULAR PRODUCTS) IMPLANT
COVER SWIFTLINK CONNECTOR (BAG) ×1 IMPLANT
PACK EP LATEX FREE (CUSTOM PROCEDURE TRAY) ×1
PACK EP LF (CUSTOM PROCEDURE TRAY) ×1 IMPLANT
PAD DEFIB RADIO PHYSIO CONN (PAD) ×1 IMPLANT
PATCH CARTO3 (PAD) IMPLANT
SHEATH CARTO VIZIGO SM CVD (SHEATH) IMPLANT
SHEATH PINNACLE 7F 10CM (SHEATH) IMPLANT
SHEATH PINNACLE 8F 10CM (SHEATH) IMPLANT
SHEATH PINNACLE 9F 10CM (SHEATH) IMPLANT
SHEATH PROBE COVER 6X72 (BAG) IMPLANT
TUBING SMART ABLATE COOLFLOW (TUBING) IMPLANT

## 2022-10-08 NOTE — Progress Notes (Signed)
 Patient was given discharge instructions. He verbalized understanding. 

## 2022-10-08 NOTE — H&P (Signed)
Electrophysiology Office Note   Date:  10/08/2022   ID:  Sean Stanley, DOB [birth year], MRN [id number]  PCP:  Meriam Sprague, MD  Cardiologist:  Shari Prows Primary Electrophysiologist: Allred Arles Rumbold Jorja Loa, MD    Chief Complaint: atrial flutter   History of Present Illness: Sean Stanley is a 82 y.o. male who is being seen today for the evaluation of atrial flutter at the request of No ref. provider found. Presenting today for electrophysiology evaluation.  He has a history significant for ruptured aortic aneurysm in 2014, renal artery aneurysm, atrial flutter, hyperlipidemia, sleep apnea, thoracic aortic aneurysm, atrial fibrillation, minimally send mitral valve repair in 2002.  He had atrial flutter ablation in 2015.  He had 1 episode of atrial fibrillation around the time of COVID infection.  He had ILR implanted for atrial fibrillation monitoring.  Over the last few months, he has had more frequent episodes of atrial fibrillation.  He has been started on amiodarone.  Since starting amiodarone, he has remained in sinus rhythm.  He would prefer to not take amiodarone long-term.  Today, denies symptoms of palpitations, chest pain, shortness of breath, orthopnea, PND, lower extremity edema, claudication, dizziness, presyncope, syncope, bleeding, or neurologic sequela. The patient is tolerating medications without difficulties. Plan AF ablation today.    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 [facility])    being followed at [facility]   Aneurysm of splenic artery (HCC) 2014   s/p coiling 12/16/12 - [facility]   Atrial flutter (HCC)    BPH (benign prostatic  hyperplasia)    Cancer (HCC)    melanoma on lower right back and left chest - surgically removed and cleared   Difficult intubation    Dysrhythmia    H/O agent Orange exposure    Headache    migraine- not current   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 Earl Gala told him he had improved   Paroxysmal atrial fibrillation (HCC) 01/02/2021   Pneumonia    Thoracic ascending aortic aneurysm (HCC)    4.5 cm 03/2018 CT. 4.4 cm on echo 10/2021 and on CTA 03/2021   Thyroid cancer [facility])    Past Surgical History:  Procedure Laterality Date   ABDOMINAL ANGIOGRAM  08/05/2012   aneurysm repair     CARDIOVERSION  02/12/2012   Procedure: CARDIOVERSION;  Surgeon: [name], MD;  Location: [facility] ENDOSCOPY;  Service: Cardiovascular;  Laterality: N/A;   CARDIOVERSION N/A 06/22/2013   Procedure: CARDIOVERSION;  Surgeon: Lars Masson, MD;  Location: [facility] ENDOSCOPY;  Service: Cardiovascular;  Laterality: N/A;   CARDIOVERSION N/A 06/19/2021   Procedure: CARDIOVERSION;  Surgeon: Christell Constant, MD;  Location: [facility] ENDOSCOPY;  Service: Cardiovascular;  Laterality: N/A;   CATARACT EXTRACTION Bilateral 2018   with lens  implant   CHOLECYSTECTOMY     COLONOSCOPY     ESOPHAGOGASTRODUODENOSCOPY     implantable loop recorder placement  03/29/2021   [employer] Reveal Linq model LNQ [id number] G) implantable loop recorder   IR IMAGING GUIDED PORT INSERTION  05/26/2018   IR REMOVAL TUN ACCESS W/ PORT W/O FL MOD SED  02/05/2021   LYMPH NODE BIOPSY Left 04/27/2018   Procedure: EXCISIONAL BIOPSY OF LEFT CERVICAL LYMPH NODE;  Surgeon: Christia Reading, MD;  Location: [facility] OR;  Service: ENT;  Laterality: Left;    LYMPH NODE BIOPSY Left 11/23/2018   Procedure: LEFT CERVICAL LYMPH NODE OPEN BIOPSY;  Surgeon: Christia Reading, MD;  Location: [facility] OR;  Service: ENT;  Laterality: Left;   MENISCUS REPAIR Right 2009   MITRAL VALVE REPAIR  2002   [facility]   NM MYOVIEW LTD  07/22/2006   no ischemia   RADICAL NECK DISSECTION Left 01/15/2019   Procedure: LEFT NECK DISSECTION;  Surgeon: Christia Reading, MD;  Location: [facility] OR;  Service: ENT;  Laterality: Left;   RIGHT HEART CATH  06/19/2004   normal right heart dynamics. EF 50%   TEE WITHOUT CARDIOVERSION  02/12/2012   Procedure: TRANSESOPHAGEAL ECHOCARDIOGRAM (TEE);  Surgeon: [name], MD;  Location: [facility] ENDOSCOPY;  Service: Cardiovascular;  Laterality: N/A;   TEE WITHOUT CARDIOVERSION N/A 06/22/2013   Procedure: TRANSESOPHAGEAL ECHOCARDIOGRAM (TEE);  Surgeon: Lars Masson, MD;  Location: [facility] ENDOSCOPY;  Service: Cardiovascular;  Laterality: N/A;   THYROIDECTOMY N/A 01/15/2019   Procedure: TOTAL THYROIDECTOMY;  Surgeon: Christia Reading, MD;  Location: [facility] OR;  Service: ENT;  Laterality: N/A;     Current Facility-Administered Medications  Medication Dose Route Frequency Provider Last Rate Last Admin   0.9 %  sodium chloride infusion   Intravenous Continuous Regan Lemming, MD 50 mL/hr at 10/08/22 0841 New Bag at 10/08/22 0841    Allergies:   Levaquin [levofloxacin in d5w], Nickel, Pravastatin, Simvastatin, Allopurinol, Ampicillin, and Penicillin g   Social History:  The patient  reports that he has never smoked. He has never used smokeless tobacco. He reports that he does not currently use alcohol. He reports that he does not use drugs.   Family History:  The patient's family history includes Cancer in his maternal grandmother; Heart attack in his maternal grandfather, paternal grandfather, and paternal grandmother; Heart failure (age of onset: 64) in his father; Parkinson's disease (age of onset: 47) in his mother.   ROS:  Please see the history of  present illness.   Otherwise, review of systems is positive for none.   All other systems are reviewed and negative.   PHYSICAL EXAM: VS:  BP 136/79 (BP Location: Left Arm)   Temp 97.7 F (36.5 C) (Oral)   Ht 6\' 1"  (1.854 m)   Wt 97.5 kg   SpO2 97%   BMI 28.37 kg/m  , BMI Body mass index is 28.37 kg/m. GEN: Well nourished, well developed, in no acute distress  HEENT: normal  Neck: no JVD, carotid bruits, or masses Cardiac: RRR; no murmurs, rubs, or gallops,no edema  Respiratory:  clear to auscultation bilaterally, normal work of breathing GI: soft, nontender, nondistended, + BS MS: no deformity or atrophy  Skin: warm and dry Neuro:  Strength and sensation are intact Psych: euthymic mood, full affect  Recent Labs: 03/23/2022: TSH 0.673 04/05/2022: Magnesium 2.2 04/10/2022: B Natriuretic Peptide 120.5 04/11/2022: ALT 28 09/19/2022: BUN 34; Creatinine, Ser 1.46; Hemoglobin 13.1; Platelets 171; Potassium 4.8; Sodium 140  Lipid Panel     Component Value Date/Time   CHOL 156 09/03/2021 0749   TRIG 92 09/03/2021 0749   HDL 61 09/03/2021 0749   CHOLHDL 2.6 09/03/2021 0749   CHOLHDL 3.2 02/22/2019 0757   VLDL 23 02/22/2019 0757   LDLCALC 78 09/03/2021 0749     Wt Readings from Last 3 Encounters:  10/08/22 97.5 kg  08/20/22 97.8 kg  08/06/22 97.7 kg      Other studies Reviewed: Additional studies/ records that were reviewed today include: TTE 11/08/21 Review of the above records today demonstrates:   1. Left ventricular ejection fraction, by estimation, is 50 to 55%. The  left ventricle has normal function. The left ventricle has no regional  wall motion abnormalities. Left ventricular diastolic function could not  be evaluated.   2. Right ventricular systolic function is normal. The right ventricular  size is normal. There is normal pulmonary artery systolic pressure.   3. Left atrial size was severely dilated.   4. Right atrial size was severely dilated.   5. The  mitral valve has been repaired/replaced. No evidence of mitral  valve regurgitation. No evidence of mitral stenosis. Procedure Date: 2002.   6. The aortic valve is tricuspid. Aortic valve regurgitation is not  visualized. Aortic valve sclerosis is present, with no evidence of aortic  valve stenosis.   7. There is moderate dilatation of the aortic root, measuring 40 mm.  There is moderate dilatation of the ascending aorta, measuring 44 mm.   8. The inferior vena cava is normal in size with greater than 50%  respiratory variability, suggesting right atrial pressure of 3 mmHg.    ASSESSMENT AND PLAN:  1.  Paroxysmal atrial fibrillation/atypical atrial flutter: Kaire A Begeman has presented today for surgery, with the diagnosis of atrial fibrillation.  The various methods of treatment have been discussed with the patient and family. After consideration of risks, benefits and other options for treatment, the patient has consented to  Procedure(s): Catheter ablation as a surgical intervention .  Risks include but not limited to complete heart block, stroke, esophageal damage, nerve damage, bleeding, vascular damage, tamponade, perforation, MI, and death. The patient's history has been reviewed, patient examined, no change in status, stable for surgery.  I have reviewed the patient's chart and labs.  Questions were answered to the patient's satisfaction.    Ekansh Sherk Elberta Fortis, MD 10/08/2022 9:16 AM

## 2022-10-08 NOTE — Discharge Instructions (Signed)

## 2022-10-08 NOTE — Anesthesia Procedure Notes (Signed)
Procedure Name: Intubation Date/Time: 10/08/2022 10:07 AM  Performed by: Margarita Rana, CRNAPre-anesthesia Checklist: Patient identified, Patient being monitored, Timeout performed, Emergency Drugs available and Suction available Patient Re-evaluated:Patient Re-evaluated prior to induction Oxygen Delivery Method: Circle System Utilized Preoxygenation: Pre-oxygenation with 100% oxygen Induction Type: IV induction Ventilation: Mask ventilation without difficulty Laryngoscope Size: Glidescope Grade View: Grade I Tube type: Oral Tube size: 7.5 mm Number of attempts: 1 Airway Equipment and Method: Video-laryngoscopy Placement Confirmation: ETT inserted through vocal cords under direct vision, positive ETCO2 and breath sounds checked- equal and bilateral Secured at: 23 cm Tube secured with: Tape Dental Injury: Teeth and Oropharynx as per pre-operative assessment

## 2022-10-08 NOTE — Transfer of Care (Signed)
Immediate Anesthesia Transfer of Care Note  Patient: Sean Stanley  Procedure(s) Performed: ATRIAL FIBRILLATION ABLATION  Patient Location: Cath Lab  Anesthesia Type:General  Level of Consciousness: awake, alert , oriented, patient cooperative, and responds to stimulation  Airway & Oxygen Therapy: Patient Spontanous Breathing and Patient connected to nasal cannula oxygen  Post-op Assessment: Report given to RN and Post -op Vital signs reviewed and stable  Post vital signs: Reviewed and stable  Last Vitals:  Vitals Value Taken Time  BP 125/72 10/08/22 1200  Temp    Pulse 58 10/08/22 1200  Resp 11 10/08/22 1200  SpO2 96 % 10/08/22 1200  Vitals shown include unfiled device data.  Last Pain:  Vitals:   10/08/22 0821  TempSrc: Oral  PainSc: 0-No pain         Complications: There were no known notable events for this encounter.

## 2022-10-09 ENCOUNTER — Encounter (HOSPITAL_COMMUNITY): Payer: Self-pay | Admitting: Cardiology

## 2022-10-09 ENCOUNTER — Other Ambulatory Visit: Payer: Self-pay | Admitting: Cardiology

## 2022-10-09 ENCOUNTER — Telehealth: Payer: Self-pay

## 2022-10-09 DIAGNOSIS — E7800 Pure hypercholesterolemia, unspecified: Secondary | ICD-10-CM

## 2022-10-09 MED FILL — Fentanyl Citrate Preservative Free (PF) Inj 100 MCG/2ML: INTRAMUSCULAR | Qty: 1 | Status: AC

## 2022-10-09 NOTE — Telephone Encounter (Signed)
The patient has been notified of the result and verbalized understanding.  All questions (if any) were answered. Frutoso Schatz, RN 10/09/2022 2:52 PM  Labs have been scheduled. Will send to scheduling to set up with Dr. Izora Ribas.

## 2022-10-09 NOTE — Telephone Encounter (Signed)
-----   Message from Meriam Sprague sent at 10/09/2022  2:41 PM EDT ----- We can order lipids and have him follow-up with Dr. Izora Ribas.   Thank you!! ----- Message ----- From: Regan Lemming, MD Sent: 10/09/2022   8:26 AM EDT To: Baird Lyons, RN; Meriam Sprague, MD  CT without LAA thrombus. Needs fasting lipids and to establish with general cardiology

## 2022-10-09 NOTE — Anesthesia Postprocedure Evaluation (Signed)
Anesthesia Post Note  Patient: Sean Stanley  Procedure(s) Performed: ATRIAL FIBRILLATION ABLATION     Patient location during evaluation: PACU Anesthesia Type: General Level of consciousness: sedated and patient cooperative Pain management: pain level controlled Vital Signs Assessment: post-procedure vital signs reviewed and stable Respiratory status: spontaneous breathing Cardiovascular status: stable Anesthetic complications: no   There were no known notable events for this encounter.  Last Vitals:  Vitals:   10/08/22 1400 10/08/22 1430  BP: 133/76 139/75  Pulse: (!) 49 (!) 50  Resp: 12 13  Temp:    SpO2: 95% 93%    Last Pain:  Vitals:   10/09/22 1308  TempSrc:   PainSc: 0-No pain                 Lewie Loron

## 2022-10-10 ENCOUNTER — Encounter: Payer: Self-pay | Admitting: Hematology and Oncology

## 2022-10-10 ENCOUNTER — Inpatient Hospital Stay: Payer: Medicare Other | Admitting: Hematology and Oncology

## 2022-10-10 ENCOUNTER — Inpatient Hospital Stay: Payer: Medicare Other | Attending: Hematology and Oncology

## 2022-10-10 ENCOUNTER — Other Ambulatory Visit: Payer: Self-pay

## 2022-10-10 VITALS — BP 120/58 | HR 55 | Temp 98.0°F | Resp 18 | Ht 73.0 in | Wt 220.4 lb

## 2022-10-10 DIAGNOSIS — C73 Malignant neoplasm of thyroid gland: Secondary | ICD-10-CM | POA: Diagnosis not present

## 2022-10-10 DIAGNOSIS — C8318 Mantle cell lymphoma, lymph nodes of multiple sites: Secondary | ICD-10-CM

## 2022-10-10 DIAGNOSIS — Z85850 Personal history of malignant neoplasm of thyroid: Secondary | ICD-10-CM | POA: Diagnosis not present

## 2022-10-10 DIAGNOSIS — Z8572 Personal history of non-Hodgkin lymphomas: Secondary | ICD-10-CM | POA: Diagnosis not present

## 2022-10-10 LAB — COMPREHENSIVE METABOLIC PANEL
ALT: 17 U/L (ref 0–44)
AST: 23 U/L (ref 15–41)
Albumin: 4.1 g/dL (ref 3.5–5.0)
Alkaline Phosphatase: 52 U/L (ref 38–126)
Anion gap: 7 (ref 5–15)
BUN: 34 mg/dL — ABNORMAL HIGH (ref 8–23)
CO2: 27 mmol/L (ref 22–32)
Calcium: 9 mg/dL (ref 8.9–10.3)
Chloride: 105 mmol/L (ref 98–111)
Creatinine, Ser: 1.48 mg/dL — ABNORMAL HIGH (ref 0.61–1.24)
GFR, Estimated: 47 mL/min — ABNORMAL LOW (ref >60)
Glucose, Bld: 84 mg/dL (ref 70–99)
Potassium: 4.7 mmol/L (ref 3.5–5.1)
Sodium: 139 mmol/L (ref 135–145)
Total Bilirubin: 1 mg/dL (ref 0.3–1.2)
Total Protein: 6.3 g/dL — ABNORMAL LOW (ref 6.5–8.1)

## 2022-10-10 LAB — CBC WITH DIFFERENTIAL/PLATELET
Abs Immature Granulocytes: 0.05 10*3/uL (ref 0.00–0.07)
Basophils Absolute: 0.1 10*3/uL (ref 0.0–0.1)
Basophils Relative: 1 %
Eosinophils Absolute: 0.2 10*3/uL (ref 0.0–0.5)
Eosinophils Relative: 2 %
HCT: 39.4 % (ref 39.0–52.0)
Hemoglobin: 13 g/dL (ref 13.0–17.0)
Immature Granulocytes: 1 %
Lymphocytes Relative: 16 %
Lymphs Abs: 1.5 10*3/uL (ref 0.7–4.0)
MCH: 31.3 pg (ref 26.0–34.0)
MCHC: 33 g/dL (ref 30.0–36.0)
MCV: 94.7 fL (ref 80.0–100.0)
Monocytes Absolute: 0.8 10*3/uL (ref 0.1–1.0)
Monocytes Relative: 8 %
Neutro Abs: 6.9 10*3/uL (ref 1.7–7.7)
Neutrophils Relative %: 72 %
Platelets: 179 10*3/uL (ref 150–400)
RBC: 4.16 MIL/uL — ABNORMAL LOW (ref 4.22–5.81)
RDW: 14.3 % (ref 11.5–15.5)
WBC: 9.4 10*3/uL (ref 4.0–10.5)
nRBC: 0 % (ref 0.0–0.2)

## 2022-10-10 NOTE — Assessment & Plan Note (Signed)
He has no signs of lymphoma recurrence He is reassured He will continue surveillance CT imaging at [facility] once a year and I will monitor for recurrence of lymphoma I will see him again in 6 months for further follow-up

## 2022-10-10 NOTE — Progress Notes (Signed)
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   All questions were answered. The patient knows to call the clinic with any problems, questions or concerns. The total time spent in the appointment was 20 minutes encounter with patients including review of chart and various tests results, discussions about plan of care and coordination of care plan   Artis Delay, MD 10/10/2022 1:07 PM  INTERVAL HISTORY: Please see below for problem oriented charting. he returns for surveillance follow-up for history of lymphoma He had recent cardiac ablation His energy level is fair Continues to have some mild neck discomfort Denies recent infection No palpable or appreciable new lymphadenopathy  REVIEW OF SYSTEMS:   Constitutional: Denies fevers, chills or abnormal weight loss Eyes: Denies blurriness of vision Ears, nose, mouth, throat, and face: Denies mucositis or sore throat Respiratory: Denies cough, dyspnea or wheezes Cardiovascular: Denies palpitation, chest discomfort or lower extremity swelling Gastrointestinal:  Denies nausea, heartburn or change in bowel habits Skin: Denies abnormal skin rashes Lymphatics: Denies new lymphadenopathy or easy bruising Neurological:Denies numbness, tingling or new  weaknesses Behavioral/Psych: Mood is stable, no new changes  All other systems were reviewed with the patient and are negative.  I have reviewed the past medical history, past surgical history, social history and family history with the patient and they are unchanged from previous note.  ALLERGIES:  is allergic to levaquin [levofloxacin in d5w], nickel, pravastatin, simvastatin, allopurinol, ampicillin, and penicillin g.  MEDICATIONS:  Current Outpatient Medications  Medication Sig Dispense Refill   acetaminophen (TYLENOL) 500 MG tablet Take 1,000 mg by mouth daily as needed for moderate pain.     alfuzosin (UROXATRAL) 10 MG 24 hr tablet Take 10 mg by mouth every other day. Alternate with Finasteride the evening     amiodarone (PACERONE) 200 MG tablet Take 1 tablet (200 mg total) by mouth daily. 90 tablet 1   apixaban (ELIQUIS) 5 MG TABS tablet Take 1 tablet (5 mg total) by mouth 2 (two) times daily. 60 tablet 3   betamethasone dipropionate 0.05 % cream Apply 1 application  topically daily as needed (mosquito bites).     buPROPion (WELLBUTRIN XL) 150 MG 24 hr tablet Take 1 tablet (150 mg total) by mouth daily. 90 tablet 3   Cholecalciferol (VITAMIN D) 2000 units tablet Take 2,000 Units by mouth at bedtime.      COVID-19 mRNA vaccine 2023-2024 (COMIRNATY) syringe Inject into the muscle. 0.3 mL 0   ezetimibe (ZETIA) 10 MG tablet Take 1 tablet (10 mg total) by mouth every morning. 90 tablet 2   finasteride (PROSCAR) 5 MG tablet Take 5 mg by mouth every other day. Alternate with Alfuzosin in the evening     influenza vaccine adjuvanted (FLUAD) 0.5 ML injection Inject into the muscle. 0.5 mL 0   levothyroxine (SYNTHROID) 175 MCG tablet Take  1 tablet (175 mcg total) by mouth daily.     lidocaine (LMX) 4 % cream Apply 1 Application topically daily as needed (peripheral neuropathy in feet).     lidocaine-prilocaine (EMLA) cream Apply to affected area once before sleep time- 30 g 3   metoprolol  succinate (TOPROL-XL) 25 MG 24 hr tablet Take 12.5 mg by mouth daily.     Polyethyl Glycol-Propyl Glycol (SYSTANE HYDRATION PF OP) Place 1 drop into both eyes 3 (three) times daily as needed (Dry eyes).     rosuvastatin (CRESTOR) 5 MG tablet Take 1 tablet (5 mg total) by mouth every Monday, Wednesday, and Friday. 36 tablet 3   No current facility-administered medications for this visit.    SUMMARY OF ONCOLOGIC HISTORY: Oncology History Overview Note  MIPI score 7.5 high risk   Mantle cell lymphoma (HCC)  06/13/2017 Imaging   Ct imaging at [facility] 1. 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 2. 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 3. Slight reduction in size of right retroperitoneal evolving hematoma.   03/30/2018 Imaging   Ct neck 1. Extensive adenopathy on the left. The largest node is a level 1/submandibular node measuring 38 x 24 x 22 mm. Largest level 2 node measures 3.2 x 2 x 2 cm. Numerous other smaller but round lymph nodes throughout the left neck in the level 2 through level 4 region. The largest level 5 node measures 3.4 x 3.4 x 2.3 cm with a transverse diameter of 2.3 cm. This contains some internal calcifications. There are numerous other pathologic nodes in the left supraclavicular to axillary region. This pattern of disease could be due to extensive left neck metastatic disease from unknown primary, lymphoma, or other systemic malignancy. 2. No evidence of mucosal or submucosal lesion. 3. Diameter of the ascending aorta is 4.5 cm. Recommend annual imaging followup by CTA or MRA. Aortic Atherosclerosis (ICD10-I70.0).     04/10/2018 Pathology Results   Left zone 1 neck mass, Fine Needle Aspiration I (smears and cell block):      Atypical lymphoid proliferation. See comment.       Specimen Adequacy:   Satisfactory for evaluation.  COMMENT:The aspirate demonstrates abundant small to medium sized lymphocytes with scattered epithelioid cells in the background which may be histiocytes.  The smears are cellular, but the cell block includes scant lymphocytes.  Immunohistochemical stains were attempted showing positive staining for CD20 with rare staining for CD3.  Cytokeratin AE1/AE3 is negative. S100 shows high nonspecific background staining but no specifically diagnostic cellular staining.  Overall, the findings indicate an atypical lymphoid proliferation but are too limited for definitive diagnosis.  Excisional biopsy with flow cytometry is recommended.   04/10/2018 Procedure   He was ENT who performed FNA of neck LN   04/27/2018 Pathology Results   Lymph node for lymphoma, [name], [name], [name], [name], [name], [name], a kappa restricted B-cell population co-expressing [name] comprises 83% of  all lymphocytes (See [id number]). Overall, the features are consistent with a mantle cell lymphoma   05/13/2018 PET scan   1. Hypermetabolic left cervical lymphadenopathy, left axillary lymphadenopathy, mediastinal / right hilar lymphadenopathy, right external iliac lymphadenopathy, and subcentimeter left external iliac and left inguinal lymph nodes, [name], [name], that is similar to slightly above liver FDG  activity, without focal lesions, that may represent lymphomatous involvement of the spleen.   05/21/2018 Cancer Staging   Staging form: Hodgkin and Non-Hodgkin Lymphoma, AJCC 8th Edition - Clinical: Stage III - Signed by Artis Delay, MD on 05/21/2018   05/26/2018 Procedure   Ultrasound and fluoroscopically guided right internal jugular single lumen power port catheter insertion. Tip in the SVC/RA junction. Catheter ready for use.     06/01/2018 -  Chemotherapy   The patient had Bendamustine and Rituximab for chemotherapy treatment   08/24/2018 PET scan   1. Partial response to therapy with complete resolution of size and metabolic activity of the dominant LEFT submandibular node (level 1) and LEFT axillary nodes. 2. Persistent and increased metabolic activity of left level 3 lymph nodes ( Deauville 4). Nodes are partially calcified and similar size. 3. No new metastatic disease. 4. Normal spleen and bone marrow.   11/09/2018 PET scan   IMPRESSION: 1. Mild response to therapy of hypermetabolic left cervical nodes. (Deauville) 4. 2. No new or progressive disease. 3. Coronary artery atherosclerosis. Aortic Atherosclerosis (ICD10-I70.0). 4. Trace cul-de-sac fluid, similar.   12/18/2018 Imaging   1. 4 mm solid nodule in the right lower lobe, which is technically indeterminate. Recommend attention on follow-up per clinical protocol. 2. Small calcified nodule adjacent to the left lobe of the thyroid, which may represent thyroid nodule or calcified lymph node. Recommend correlation with thyroid ultrasound. 3. The ascending aorta appears mildly dilated, measuring up to 4.2cm. 4. Decreased left axillary lymphadenopathy.   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   Stranding with postsurgical changes along the right anterior neck anterior to the thyroid cartilage, favored to be related to recent surgery.  Attention on follow-up is suggested.   No suspicious lymphadenopathy in the neck, chest, abdomen, or pelvis. Deauville criteria 1   11/22/2019 Imaging   1. No evidence of recurrent lymphoma in the chest, abdomen or pelvis. Normal size spleen. 2. 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 Recommend annual imaging followup by CTA or MRA. This recommendation follows 2010 [facility]/AHA/AATS/ACR/ASA/SCA/SCAI/SIR/STS/SVM Guidelines for the Diagnosis and Management of Patients with Thoracic Aortic Disease. Circulation. 2010; 121: N027-O536. Aortic aneurysm NOS (ICD10-I71.9). Moderate diffuse colonic diverticulosis. Mild prostatomegaly. Aortic Atherosclerosis (ICD10-I70.0).   05/30/2020 Imaging   Stable exam. No evidence of recurrent lymphoma or metastatic disease within the chest, abdomen, or pelvis.   Colonic diverticulosis, without radiographic evidence of diverticulitis.   Tiny nonobstructing left renal calculus.   Stable mildly enlarged prostate.   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   Aortic and coronary atherosclerotic calcification.   02/05/2021 Procedure   Removal of implanted Port-A-Cath utilizing sharp and blunt dissection. The procedure was uncomplicated.   04/11/2021 Imaging   1. No evidence of mass or lymphadenopathy in the chest, abdomen, or pelvis. 2. Normal spleen. 3. Unchanged enlargement of the tubular ascending thoracic aorta, measuring up to 4.4 x 4.4 cm.  4. Cardiomegaly and coronary artery disease.  Thyroid cancer (HCC)  11/23/2018 Pathology Results   Lymph node for lymphoma, Left zone 3 - PAPILLARY THYROID CARCINOMA. - SEE MICROSCOPIC DESCRIPTION. Microscopic Comment The specimen consists mostly of encapsulated papillary thyroid carcinoma. There are portions of lymphoid tissue attached to the periphery of the specimen and in the adjacent adipose tissue which suggests that this may represent papillary carcinoma metastatic to a lymph  node.   11/23/2018 Surgery   PREOPERATIVE DIAGNOSIS:  Mantle cell lymphoma and cervical lymphadenopathy.   POSTOPERATIVE DIAGNOSIS:  Mantle cell lymphoma and cervical lymphadenopathy.   PROCEDURE:  Excisional biopsy of left cervical lymph nodes.     01/15/2019 Pathology Results   A. SOFT TISSUE, NECK, ADJACENT TO LEFT RECURRENT NERVE, BIOPSY: - Papillary thyroid carcinoma. B. 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 C. ADDITIONAL LEFT LEVEL 4 TISSUE, EXCISION: - There is no evidence of carcinoma in 2 of 2 lymph nodes (0/2). D. TOTAL THYROIDECTOMY: - Papillary thyroid carcinoma, 0.8 cm. - Carcinoma is broadly present at an inked tissue edge. - See oncology table below. E. LEFT LEVEL 6 TISSUE, EXCISION: - Benign fibroadipose tissue. - There is no evidence of malignancy. F. LEFT RECURRENT NERVE AND SURROUNDING TISSUE, EXCISION: - Metastatic papillary thyroid carcinoma in 3 of 3 lymph nodes (3/3).  THYROID GLAND: Procedure: Thyroidectomy Tumor Focality: Unifocal Tumor Site: Left lobe Tumor Size: 0.8 cm Histologic Type: 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Margins: Carcinoma is broadly present at a black inked tissue edge. 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   PROCEDURE:  Total thyroidectomy and left modified radical  neck dissection.   SURGEON:  Christia Reading, MD     FINDINGS:  There was a firm nodule in zone IIB on the left side and also some firm tissue and adherence to the jugular vein in zone III region requiring removal of a portion of the vein.  There was a firm nodule in the left thyroid lobe that extended posterior to the gland, adhering to the trachea, and encompassing the recurrent laryngeal nerve.  Tissue was sent for frozen section from adjacent to the nerve, demonstrating papillary carcinoma.  The nerve stimulated distal to the mass but not proximal to the mass; thus, the nerve was removed with the mass.  The left-sided parathyroid glands were visualized, as was the right superior parathyroid gland.  The right recurrent nerve was kept safe and stimulated well at the end of the case.   05/30/2020 Imaging   Negative for recurrent mass or adenopathy in the neck   Left vocal cord paresis.   04/03/2021 Cancer Staging   Staging form: Thyroid - Differentiated, AJCC 8th Edition - Pathologic stage from 04/03/2021: pT1a, pN1, cM0 - Signed by Artis Delay, MD on 04/03/2021 Lymph node metastasis: Present   04/11/2021 Imaging   No change from the prior study.  No recurrent mass or adenopathy   Postop changes left neck, stable   Chronic paresis left vocal cord unchanged.  PHYSICAL EXAMINATION: ECOG PERFORMANCE STATUS: 0 - Asymptomatic  Vitals:   10/10/22 1050  BP: (!) 120/58  Pulse: (!) 55  Resp: 18  Temp: 98 F (36.7 C)  SpO2: 97%   Filed Weights   10/10/22 1050  Weight: 220 lb 6.4 oz (100 kg)    GENERAL:alert, no distress and comfortable SKIN: skin color, texture, turgor are normal, no rashes or significant lesions EYES: normal, Conjunctiva are pink and non-injected, sclera clear OROPHARYNX:no exudate, no erythema and lips, buccal mucosa, and tongue normal  NECK: Noted well-healed surgical scar LYMPH:  no palpable lymphadenopathy in the cervical, axillary or inguinal LUNGS: clear to  auscultation and percussion with normal breathing effort HEART: regular rate & rhythm and no murmurs and no lower extremity edema ABDOMEN:abdomen soft, non-tender and normal bowel sounds Musculoskeletal:no cyanosis of digits and no clubbing  NEURO: alert & oriented x 3 with hoarseness, no focal motor/sensory deficits  LABORATORY DATA:  I have reviewed the data as listed    Component Value Date/Time   NA 139 10/10/2022 1031   NA 140 09/19/2022 0746   K 4.7 10/10/2022 1031   CL 105 10/10/2022 1031   CO2 27 10/10/2022 1031   GLUCOSE 84 10/10/2022 1031   BUN 34 (H) 10/10/2022 1031   BUN 34 (H) 09/19/2022 0746   CREATININE 1.48 (H) 10/10/2022 1031   CREATININE 1.43 (H) 04/11/2022 1000   CALCIUM 9.0 10/10/2022 1031   PROT 6.3 (L) 10/10/2022 1031   PROT 6.0 09/03/2021 0749   ALBUMIN 4.1 10/10/2022 1031   ALBUMIN 4.3 09/03/2021 0749   AST 23 10/10/2022 1031   AST 20 04/11/2022 1000   ALT 17 10/10/2022 1031   ALT 28 04/11/2022 1000   ALKPHOS 52 10/10/2022 1031   BILITOT 1.0 10/10/2022 1031   BILITOT 1.3 (H) 04/11/2022 1000   GFRNONAA 47 (L) 10/10/2022 1031   GFRNONAA 50 (L) 04/11/2022 1000   GFRAA >60 11/22/2019 1023    No results found for: "SPEP", "UPEP"  Lab Results  Component Value Date   WBC 9.4 10/10/2022   NEUTROABS 6.9 10/10/2022   HGB 13.0 10/10/2022   HCT 39.4 10/10/2022   MCV 94.7 10/10/2022   PLT 179 10/10/2022      Chemistry      Component Value Date/Time   NA 139 10/10/2022 1031   NA 140 09/19/2022 0746   K 4.7 10/10/2022 1031   CL 105 10/10/2022 1031   CO2 27 10/10/2022 1031   BUN 34 (H) 10/10/2022 1031   BUN 34 (H) 09/19/2022 0746   CREATININE 1.48 (H) 10/10/2022 1031   CREATININE 1.43 (H) 04/11/2022 1000      Component Value Date/Time   CALCIUM 9.0 10/10/2022 1031   ALKPHOS 52 10/10/2022 1031   AST 23 10/10/2022 1031   AST 20 04/11/2022 1000   ALT 17 10/10/2022 1031   ALT 28 04/11/2022 1000   BILITOT 1.0 10/10/2022 1031   BILITOT 1.3 (H)  04/11/2022 1000

## 2022-10-10 NOTE — Assessment & Plan Note (Signed)
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

## 2022-10-11 DIAGNOSIS — R300 Dysuria: Secondary | ICD-10-CM | POA: Diagnosis not present

## 2022-10-11 DIAGNOSIS — M1711 Unilateral primary osteoarthritis, right knee: Secondary | ICD-10-CM | POA: Diagnosis not present

## 2022-10-18 NOTE — Progress Notes (Signed)
[facility] Summary Report / Loop Recorder 

## 2022-10-29 ENCOUNTER — Ambulatory Visit: Payer: Medicare Other | Attending: Cardiology

## 2022-10-29 DIAGNOSIS — E7800 Pure hypercholesterolemia, unspecified: Secondary | ICD-10-CM | POA: Diagnosis not present

## 2022-10-29 LAB — LIPID PANEL
Chol/HDL Ratio: 2.2 ratio (ref 0.0–5.0)
Cholesterol, Total: 160 mg/dL (ref 100–199)
HDL: 73 mg/dL (ref >39)
LDL Chol Calc (NIH): 75 mg/dL (ref 0–99)
Triglycerides: 60 mg/dL (ref 0–149)
VLDL Cholesterol Cal: 12 mg/dL (ref 5–40)

## 2022-10-30 ENCOUNTER — Telehealth: Payer: Self-pay

## 2022-10-30 MED ORDER — ROSUVASTATIN CALCIUM 10 MG PO TABS: 10.0000 mg | ORAL_TABLET | ORAL | 3 refills | Status: DC

## 2022-10-30 NOTE — Telephone Encounter (Signed)
The patient has been notified of the result and verbalized understanding.  All questions (if any) were answered.  N , RN 10/30/2022 1:23 PM  Pt increase rosuvastatin to 10 mg Q MWF;expresses can not tolerate taking medication every day.  Will contact our office if he develops muscle aches.  Will have repeat labs drawn at next OV with Dr. Elease Hashimoto on 01/20/23 will fast prior.  All questions answered.

## 2022-10-30 NOTE — Telephone Encounter (Signed)
-----   Message from Meriam Sprague sent at 10/30/2022 12:52 PM EDT ----- Cholesterol is stable from prior. Given that his Ca score of 602 on most recent CT scan which indicates the presence of heart artery plaque, can we increase his crestor to 10mg  daily? If that causes muscle aches, we can refer to lipid clinic for consideration of [id number].

## 2022-11-04 ENCOUNTER — Encounter: Payer: Self-pay | Admitting: Hematology and Oncology

## 2022-11-05 ENCOUNTER — Ambulatory Visit (HOSPITAL_COMMUNITY)
Admission: RE | Admit: 2022-11-05 | Discharge: 2022-11-05 | Disposition: A | Discharge status: Home / Self Care | Payer: Medicare Other | Source: Ambulatory Visit | Attending: Internal Medicine | Admitting: Internal Medicine

## 2022-11-05 ENCOUNTER — Other Ambulatory Visit: Payer: Self-pay | Admitting: Hematology and Oncology

## 2022-11-05 ENCOUNTER — Encounter (HOSPITAL_COMMUNITY): Payer: Self-pay | Admitting: Internal Medicine

## 2022-11-05 ENCOUNTER — Telehealth: Payer: Self-pay | Admitting: Hematology and Oncology

## 2022-11-05 VITALS — BP 122/72 | HR 56 | Ht 73.0 in | Wt 216.2 lb

## 2022-11-05 DIAGNOSIS — D6869 Other thrombophilia: Secondary | ICD-10-CM | POA: Diagnosis not present

## 2022-11-05 DIAGNOSIS — Z9889 Other specified postprocedural states: Secondary | ICD-10-CM | POA: Insufficient documentation

## 2022-11-05 DIAGNOSIS — C8318 Mantle cell lymphoma, lymph nodes of multiple sites: Secondary | ICD-10-CM

## 2022-11-05 DIAGNOSIS — C73 Malignant neoplasm of thyroid gland: Secondary | ICD-10-CM

## 2022-11-05 DIAGNOSIS — I480 Paroxysmal atrial fibrillation: Secondary | ICD-10-CM

## 2022-11-05 DIAGNOSIS — I428 Other cardiomyopathies: Secondary | ICD-10-CM | POA: Insufficient documentation

## 2022-11-05 DIAGNOSIS — I4819 Other persistent atrial fibrillation: Secondary | ICD-10-CM | POA: Insufficient documentation

## 2022-11-05 NOTE — Progress Notes (Signed)
Primary Care Physician: [name], MD Referring Physician: ER f/u  Prior Cardiologist: Dr. Lupita Shutter ([facility]), recently transferred over to Dr. Shari Prows Primary EP: Dr Ward Chatters is a 82 y.o. male with a h/o  MV repair 2002,  multi abdominal aneurysms with prior bleeding,(followed at [facility]),  unable to take anticoagulation, h/o atrial flutter, that is in the afib clinic, for onset of palpitations 8/9. He was found to be in rapid atrial fib at 143 bpm. He could not be cardioverted as he is not on anticoagulation 2/2 to multiple abdominal  aneurysms. He converted as IV was being started.  He was watched for another 3 hours and then d/c home with f/u here.  He is on metoprolol daily.   He states h/o ablation in 2015 at [facility] and had Covid the end of July. This is the first arrhythmia he has dad since ablation. He is in SR today.   Follow up in the AF clinic 05/28/21. Patient called [facility] with elevated heart rates and was instructed to take an additional dose of metoprolol and present to the ED if his symptoms did not improved. Patient was seen at the ED 05/27/21 with atypical atrial flutter. Rate control with IV metoprolol was unsuccessful and he was discharged without any other intervention. He has symptoms of fatigue, SOB, and poor sleep.   Follow up in the AF clinic 04/10/22. Patient has been seen in the ED 1/12 and today for rapid atrial flutter. He was cardioverted both times. Unfortunately, he was back out of rhythm after he returned home. He is in atrial flutter today with symptoms of fatigue and SOB with exertion. There does not appear to be any specific triggers.   Follow up in the AF clinic 04/22/22. Patient reports that his heart rates have improved to 90s-100s and his symptoms have also improved since starting amiodarone. He remains in atrial flutter. No bleeding issues on anticoagulation.   On f/u 11/05/22, patient is currently in NSR. S/p Afib ablation on 10/08/22 by  Dr. Elberta Fortis. No episodes of Afib since ablation. He does note that he has terrible dreams since starting the amiodarone. No chest pain, SOB, or trouble swallowing compared to previous. Leg sites healed without issue. No missed doses of anticoagulant.  Today, he denies symptoms of orthopnea, PND, lower extremity edema, dizziness, presyncope, syncope, snoring, daytime somnolence, bleeding, or neurologic sequela. The patient is tolerating medications without difficulties and is otherwise without complaint today.   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 [facility])    being [facility] at [facility]   Aneurysm of splenic artery (HCC) 2014   s/p coiling 12/16/12 - [facility]   Atrial flutter (HCC)    BPH (benign prostatic hyperplasia)    Cancer (HCC)    melanoma on lower right back and left chest - surgically removed and cleared   Difficult intubation    Dysrhythmia    H/O agent Orange exposure    Headache    migraine- not current   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 Earl Gala told him he had improved   Paroxysmal atrial fibrillation (HCC) 01/02/2021   Pneumonia    Thoracic ascending aortic aneurysm (HCC)    4.5 cm 03/2018 CT. 4.4 cm on echo 10/2021 and on CTA 03/2021   Thyroid cancer [facility])     Current Outpatient Medications  Medication Sig Dispense Refill   acetaminophen  (TYLENOL) 500 MG tablet Take 1,000 mg by mouth daily as needed for moderate pain.     alfuzosin (UROXATRAL) 10 MG 24 hr tablet Take 10 mg by mouth every other day. Alternate with Finasteride the evening     amiodarone (PACERONE) 200 MG tablet Take 1 tablet (200 mg total) by mouth daily. 90 tablet 1   apixaban (ELIQUIS) 5 MG TABS tablet Take 1 tablet (5 mg total) by mouth 2 (two) times daily. 60 tablet 3   betamethasone dipropionate 0.05 % cream Apply 1 application  topically daily as needed (mosquito bites).     buPROPion (WELLBUTRIN XL) 150 MG 24 hr tablet Take 1 tablet (150 mg total) by mouth daily. 90 tablet 3   Cholecalciferol (VITAMIN D) 2000 units tablet Take 2,000 Units by mouth at bedtime.      ezetimibe (ZETIA) 10 MG tablet Take 1 tablet (10 mg total) by mouth every morning. 90 tablet 2   finasteride (PROSCAR) 5 MG tablet Take 5 mg by mouth every other day. Alternate with Alfuzosin in the evening     levothyroxine (SYNTHROID) 175 MCG tablet Take 1 tablet (175 mcg total) by mouth daily.     lidocaine (LMX) 4 % cream Apply 1 Application topically daily as needed (peripheral neuropathy in feet).     lidocaine-prilocaine (EMLA) cream Apply to affected area once before sleep time- 30 g 3   metoprolol succinate (TOPROL-XL) 25 MG 24 hr tablet Take 12.5 mg by mouth daily.     Polyethyl Glycol-Propyl Glycol (SYSTANE HYDRATION PF OP) Place 1 drop into both eyes 3 (three) times daily as needed (Dry eyes).     rosuvastatin (CRESTOR) 10 MG tablet Take 1 tablet (10 mg total) by mouth every Monday, Wednesday, and Friday. 36 tablet 3   COVID-19 mRNA vaccine 2023-2024 (COMIRNATY) syringe Inject into the muscle. (Patient not taking: Reported on 11/05/2022) 0.3 mL 0   influenza vaccine adjuvanted (FLUAD) 0.5 ML injection Inject into the muscle. (Patient not taking: Reported on 11/05/2022) 0.5 mL 0   No current facility-administered medications for this encounter.    Allergies  Allergen Reactions    Levaquin [Levofloxacin In D5w]     tendonitis   Nickel Itching   Pravastatin     Muscle aches   Simvastatin     Chest pain   Allopurinol Rash   Ampicillin Rash    Did it involve swelling of the face/tongue/throat, SOB, or low BP? No Did it involve sudden or severe rash/hives, skin peeling, or any reaction on the inside of your mouth or nose? Yes Did you need to seek medical attention at a hospital or doctor's office? Yes When did it last happen?      50+ years If all above answers are "NO", may proceed with cephalosporin use.    Penicillin G Rash   ROS- All systems  are reviewed and negative except as per the HPI above  Physical Exam: Vitals:   11/05/22 1308  BP: 122/72  Pulse: (!) 56  Weight: 98.1 kg  Height: 6\' 1"  (1.854 m)    Wt Readings from Last 3 Encounters:  11/05/22 98.1 kg  10/10/22 100 kg  10/08/22 97.5 kg    Labs: Lab Results  Component Value Date   NA 139 10/10/2022   K 4.7 10/10/2022   CL 105 10/10/2022   CO2 27 10/10/2022   GLUCOSE 84 10/10/2022   BUN 34 (H) 10/10/2022   CREATININE 1.48 (H) 10/10/2022   CALCIUM 9.0 10/10/2022   PHOS 3.4 08/11/2012   MG 2.2 04/05/2022   Lab Results  Component Value Date   INR 1.1 04/10/2022   Lab Results  Component Value Date   CHOL 160 10/29/2022   HDL 73 10/29/2022   LDLCALC 75 10/29/2022   TRIG 60 10/29/2022   GEN- The patient is well appearing, alert and oriented x 3 today.   Neck - no JVD or carotid bruit noted Lungs- Clear to ausculation bilaterally, normal work of breathing Heart- Regular bradycardic rate and rhythm, no murmurs, rubs or gallops, PMI not laterally displaced Extremities- no clubbing, cyanosis, or edema Skin - no rash or ecchymosis noted  EKG-  Vent. rate 56 BPM PR interval 202 ms QRS duration 112 ms QT/QTcB 480/463 ms P-R-T axes 36 10 51 Sinus bradycardia Otherwise normal ECG When compared with ECG of 08-Oct-2022 12:57, PREVIOUS ECG IS PRESENT  Epic records reviewed    Echo 11/08/21  1. Left ventricular ejection fraction, by estimation, is 50 to 55%. The left ventricle has normal function. The left ventricle has no regional wall motion abnormalities. Left ventricular diastolic function could not be evaluated.   2. Right ventricular systolic function is normal. The right ventricular size is normal. There is normal pulmonary artery systolic pressure.   3. Left atrial size was severely dilated.   4. Right atrial size was severely dilated.   5. The mitral valve has been repaired/replaced. No evidence of mitral valve regurgitation. No evidence of mitral stenosis. Procedure Date: 2002.   6. The aortic valve is tricuspid. Aortic valve regurgitation is not  visualized. Aortic valve sclerosis is present, with no evidence of aortic valve stenosis.   7. There is moderate dilatation of the aortic root, measuring 40 mm. There is moderate dilatation of the ascending aorta, measuring 44 mm.   8. The inferior vena cava is normal in size with greater than 50%  respiratory variability, suggesting right atrial pressure of 3 mmHg.   CHA2DS2-VASc Score = 4  The patient's score is based upon: CHF History: 1 HTN History: 0 Diabetes History: 0 Stroke History: 0 Vascular Disease History: 1 Age Score: 2 Gender Score: 0      ASSESSMENT AND PLAN: 1. Persistent Atrial Fibrillation/atypical atrial flutter The patient's CHA2DS2-VASc score is 4, indicating a 4.8% annual risk of stroke.   Patient s/p DCCV on 04/05/22 and 04/10/22 with quick return of his atrial flutter.  S/p Afib ablation on 10/08/22 by Dr. Elberta Fortis.  He is currently in NSR. He is doing well overall. He would very much like to stop the amiodarone at upcoming appt with Dr. Elberta Fortis due to possible side effect of bad dreams.  Continue amiodarone 200 mg daily. Continue Toprol 12.5 mg daily.  2. Secondary Hypercoagulable State (ICD10:  D68.69) The patient is at significant risk for stroke/thromboembolism based upon  his CHA2DS2-VASc Score of 4.  Continue Apixaban (Eliquis).  No missed doses.  3. Valvular heart disease Prior MV repair  4. NICM EF normalized Appears euvolemic today.   Follow up as scheduled with Dr Elberta Fortis.   Lake Bells, PA-C Afib Clinic [facility] [address] [place], [place] [address] [phone]

## 2022-11-05 NOTE — Telephone Encounter (Signed)
Per Dr.Gorsuch, called scheduling to schedule CT scan on same day as current CT scan. Called pt to make aware of appt and time. Pt verbalized understanding.

## 2022-11-11 ENCOUNTER — Ambulatory Visit (INDEPENDENT_AMBULATORY_CARE_PROVIDER_SITE_OTHER): Payer: Medicare Other

## 2022-11-11 DIAGNOSIS — I4819 Other persistent atrial fibrillation: Secondary | ICD-10-CM | POA: Diagnosis not present

## 2022-11-11 LAB — CUP PACEART REMOTE DEVICE CHECK
Date Time Interrogation Session: 20240818231012
Implantable Pulse Generator Implant Date: 20230105

## 2022-11-19 ENCOUNTER — Ambulatory Visit: Payer: Medicare Other | Admitting: Neurology

## 2022-11-19 DIAGNOSIS — C44319 Basal cell carcinoma of skin of other parts of face: Secondary | ICD-10-CM | POA: Diagnosis not present

## 2022-11-19 DIAGNOSIS — D225 Melanocytic nevi of trunk: Secondary | ICD-10-CM | POA: Diagnosis not present

## 2022-11-19 DIAGNOSIS — L720 Epidermal cyst: Secondary | ICD-10-CM | POA: Diagnosis not present

## 2022-11-19 DIAGNOSIS — D1801 Hemangioma of skin and subcutaneous tissue: Secondary | ICD-10-CM | POA: Diagnosis not present

## 2022-11-19 DIAGNOSIS — L814 Other melanin hyperpigmentation: Secondary | ICD-10-CM | POA: Diagnosis not present

## 2022-11-19 DIAGNOSIS — L821 Other seborrheic keratosis: Secondary | ICD-10-CM | POA: Diagnosis not present

## 2022-11-19 DIAGNOSIS — D2271 Melanocytic nevi of right lower limb, including hip: Secondary | ICD-10-CM | POA: Diagnosis not present

## 2022-11-19 DIAGNOSIS — D2272 Melanocytic nevi of left lower limb, including hip: Secondary | ICD-10-CM | POA: Diagnosis not present

## 2022-11-21 NOTE — Progress Notes (Signed)
[facility] Summary Report / Loop Recorder 

## 2022-11-22 DIAGNOSIS — H04123 Dry eye syndrome of bilateral lacrimal glands: Secondary | ICD-10-CM | POA: Diagnosis not present

## 2022-11-22 DIAGNOSIS — H40013 Open angle with borderline findings, low risk, bilateral: Secondary | ICD-10-CM | POA: Diagnosis not present

## 2022-11-22 DIAGNOSIS — H02401 Unspecified ptosis of right eyelid: Secondary | ICD-10-CM | POA: Diagnosis not present

## 2022-11-22 DIAGNOSIS — H0016 Chalazion left eye, unspecified eyelid: Secondary | ICD-10-CM | POA: Diagnosis not present

## 2022-12-11 DIAGNOSIS — I4891 Unspecified atrial fibrillation: Secondary | ICD-10-CM | POA: Diagnosis not present

## 2022-12-11 DIAGNOSIS — N5201 Erectile dysfunction due to arterial insufficiency: Secondary | ICD-10-CM | POA: Diagnosis not present

## 2022-12-11 DIAGNOSIS — C8310 Mantle cell lymphoma, unspecified site: Secondary | ICD-10-CM | POA: Diagnosis not present

## 2022-12-11 DIAGNOSIS — N400 Enlarged prostate without lower urinary tract symptoms: Secondary | ICD-10-CM | POA: Diagnosis not present

## 2022-12-11 DIAGNOSIS — Z85850 Personal history of malignant neoplasm of thyroid: Secondary | ICD-10-CM | POA: Diagnosis not present

## 2022-12-11 DIAGNOSIS — Z0000 Encounter for general adult medical examination without abnormal findings: Secondary | ICD-10-CM | POA: Diagnosis not present

## 2022-12-11 DIAGNOSIS — J383 Other diseases of vocal cords: Secondary | ICD-10-CM | POA: Diagnosis not present

## 2022-12-11 DIAGNOSIS — F418 Other specified anxiety disorders: Secondary | ICD-10-CM | POA: Diagnosis not present

## 2022-12-11 DIAGNOSIS — E039 Hypothyroidism, unspecified: Secondary | ICD-10-CM | POA: Diagnosis not present

## 2022-12-11 DIAGNOSIS — G6289 Other specified polyneuropathies: Secondary | ICD-10-CM | POA: Diagnosis not present

## 2022-12-11 DIAGNOSIS — Z1331 Encounter for screening for depression: Secondary | ICD-10-CM | POA: Diagnosis not present

## 2022-12-11 DIAGNOSIS — G4700 Insomnia, unspecified: Secondary | ICD-10-CM | POA: Diagnosis not present

## 2022-12-11 DIAGNOSIS — C73 Malignant neoplasm of thyroid gland: Secondary | ICD-10-CM | POA: Diagnosis not present

## 2022-12-11 DIAGNOSIS — E7800 Pure hypercholesterolemia, unspecified: Secondary | ICD-10-CM | POA: Diagnosis not present

## 2022-12-16 ENCOUNTER — Ambulatory Visit (INDEPENDENT_AMBULATORY_CARE_PROVIDER_SITE_OTHER): Payer: Medicare Other

## 2022-12-16 DIAGNOSIS — I4819 Other persistent atrial fibrillation: Secondary | ICD-10-CM

## 2022-12-16 LAB — CUP PACEART REMOTE DEVICE CHECK
Date Time Interrogation Session: 20240920230652
Implantable Pulse Generator Implant Date: 20230105

## 2022-12-17 DIAGNOSIS — I714 Abdominal aortic aneurysm, without rupture, unspecified: Secondary | ICD-10-CM | POA: Diagnosis not present

## 2022-12-17 DIAGNOSIS — I700 Atherosclerosis of aorta: Secondary | ICD-10-CM | POA: Diagnosis not present

## 2022-12-17 DIAGNOSIS — G3184 Mild cognitive impairment, so stated: Secondary | ICD-10-CM | POA: Diagnosis not present

## 2022-12-17 DIAGNOSIS — E785 Hyperlipidemia, unspecified: Secondary | ICD-10-CM | POA: Diagnosis not present

## 2022-12-17 DIAGNOSIS — I429 Cardiomyopathy, unspecified: Secondary | ICD-10-CM | POA: Diagnosis not present

## 2022-12-17 DIAGNOSIS — N1832 Chronic kidney disease, stage 3b: Secondary | ICD-10-CM | POA: Diagnosis not present

## 2022-12-17 DIAGNOSIS — J3800 Paralysis of vocal cords and larynx, unspecified: Secondary | ICD-10-CM | POA: Diagnosis not present

## 2022-12-17 DIAGNOSIS — E890 Postprocedural hypothyroidism: Secondary | ICD-10-CM | POA: Diagnosis not present

## 2022-12-17 DIAGNOSIS — C73 Malignant neoplasm of thyroid gland: Secondary | ICD-10-CM | POA: Diagnosis not present

## 2022-12-17 DIAGNOSIS — C8310 Mantle cell lymphoma, unspecified site: Secondary | ICD-10-CM | POA: Diagnosis not present

## 2022-12-17 DIAGNOSIS — I2510 Atherosclerotic heart disease of native coronary artery without angina pectoris: Secondary | ICD-10-CM | POA: Diagnosis not present

## 2022-12-17 DIAGNOSIS — I4892 Unspecified atrial flutter: Secondary | ICD-10-CM | POA: Diagnosis not present

## 2022-12-18 ENCOUNTER — Other Ambulatory Visit (HOSPITAL_BASED_OUTPATIENT_CLINIC_OR_DEPARTMENT_OTHER): Payer: Self-pay

## 2022-12-18 DIAGNOSIS — Z23 Encounter for immunization: Secondary | ICD-10-CM | POA: Diagnosis not present

## 2022-12-18 MED ORDER — INFLUENZA VAC A&B SURF ANT ADJ 0.5 ML IM SUSY
0.5000 mL | PREFILLED_SYRINGE | Freq: Once | INTRAMUSCULAR | 0 refills | Status: AC
  Filled 2022-12-18: qty 0.5, 1d supply, fill #0

## 2022-12-18 MED ORDER — COVID-19 MRNA VAC-TRIS(PFIZER) 30 MCG/0.3ML IM SUSY
0.3000 mL | PREFILLED_SYRINGE | Freq: Once | INTRAMUSCULAR | 0 refills | Status: AC
  Filled 2022-12-18: qty 0.3, 1d supply, fill #0

## 2022-12-19 ENCOUNTER — Ambulatory Visit: Payer: Medicare Other | Admitting: Cardiology

## 2022-12-26 DIAGNOSIS — H04123 Dry eye syndrome of bilateral lacrimal glands: Secondary | ICD-10-CM | POA: Insufficient documentation

## 2022-12-26 DIAGNOSIS — H43819 Vitreous degeneration, unspecified eye: Secondary | ICD-10-CM | POA: Insufficient documentation

## 2022-12-26 DIAGNOSIS — H35419 Lattice degeneration of retina, unspecified eye: Secondary | ICD-10-CM | POA: Insufficient documentation

## 2022-12-26 DIAGNOSIS — H4010X0 Unspecified open-angle glaucoma, stage unspecified: Secondary | ICD-10-CM | POA: Insufficient documentation

## 2022-12-26 DIAGNOSIS — Z961 Presence of intraocular lens: Secondary | ICD-10-CM | POA: Insufficient documentation

## 2022-12-26 DIAGNOSIS — H02409 Unspecified ptosis of unspecified eyelid: Secondary | ICD-10-CM | POA: Insufficient documentation

## 2022-12-26 DIAGNOSIS — M1711 Unilateral primary osteoarthritis, right knee: Secondary | ICD-10-CM | POA: Diagnosis not present

## 2022-12-26 DIAGNOSIS — H02836 Dermatochalasis of left eye, unspecified eyelid: Secondary | ICD-10-CM | POA: Insufficient documentation

## 2022-12-26 DIAGNOSIS — H02833 Dermatochalasis of right eye, unspecified eyelid: Secondary | ICD-10-CM | POA: Insufficient documentation

## 2022-12-27 NOTE — Progress Notes (Signed)
[facility] Summary Report / Loop Recorder 

## 2022-12-30 ENCOUNTER — Encounter (HOSPITAL_COMMUNITY): Payer: Self-pay

## 2022-12-30 ENCOUNTER — Ambulatory Visit (HOSPITAL_COMMUNITY): Payer: Medicare Other

## 2022-12-30 ENCOUNTER — Ambulatory Visit (HOSPITAL_COMMUNITY)
Admission: RE | Admit: 2022-12-30 | Discharge: 2022-12-30 | Disposition: A | Discharge status: Home / Self Care | Payer: Medicare Other | Source: Ambulatory Visit | Attending: Nurse Practitioner | Admitting: Nurse Practitioner

## 2022-12-30 DIAGNOSIS — H02834 Dermatochalasis of left upper eyelid: Secondary | ICD-10-CM | POA: Diagnosis not present

## 2022-12-30 DIAGNOSIS — C73 Malignant neoplasm of thyroid gland: Secondary | ICD-10-CM | POA: Diagnosis not present

## 2022-12-30 DIAGNOSIS — C8318 Mantle cell lymphoma, lymph nodes of multiple sites: Secondary | ICD-10-CM | POA: Diagnosis not present

## 2022-12-30 DIAGNOSIS — R932 Abnormal findings on diagnostic imaging of liver and biliary tract: Secondary | ICD-10-CM | POA: Diagnosis not present

## 2022-12-30 DIAGNOSIS — I728 Aneurysm of other specified arteries: Secondary | ICD-10-CM | POA: Insufficient documentation

## 2022-12-30 DIAGNOSIS — H02135 Senile ectropion of left lower eyelid: Secondary | ICD-10-CM | POA: Diagnosis not present

## 2022-12-30 DIAGNOSIS — H02411 Mechanical ptosis of right eyelid: Secondary | ICD-10-CM | POA: Diagnosis not present

## 2022-12-30 DIAGNOSIS — H0279 Other degenerative disorders of eyelid and periocular area: Secondary | ICD-10-CM | POA: Diagnosis not present

## 2022-12-30 DIAGNOSIS — H02423 Myogenic ptosis of bilateral eyelids: Secondary | ICD-10-CM | POA: Diagnosis not present

## 2022-12-30 DIAGNOSIS — H02413 Mechanical ptosis of bilateral eyelids: Secondary | ICD-10-CM | POA: Diagnosis not present

## 2022-12-30 DIAGNOSIS — H57813 Brow ptosis, bilateral: Secondary | ICD-10-CM | POA: Diagnosis not present

## 2022-12-30 DIAGNOSIS — H02831 Dermatochalasis of right upper eyelid: Secondary | ICD-10-CM | POA: Diagnosis not present

## 2022-12-30 DIAGNOSIS — H02422 Myogenic ptosis of left eyelid: Secondary | ICD-10-CM | POA: Diagnosis not present

## 2022-12-30 DIAGNOSIS — Z8572 Personal history of non-Hodgkin lymphomas: Secondary | ICD-10-CM | POA: Diagnosis not present

## 2022-12-30 DIAGNOSIS — H02132 Senile ectropion of right lower eyelid: Secondary | ICD-10-CM | POA: Diagnosis not present

## 2022-12-30 DIAGNOSIS — G839 Paralytic syndrome, unspecified: Secondary | ICD-10-CM | POA: Diagnosis not present

## 2022-12-30 DIAGNOSIS — K769 Liver disease, unspecified: Secondary | ICD-10-CM | POA: Diagnosis not present

## 2022-12-30 DIAGNOSIS — I700 Atherosclerosis of aorta: Secondary | ICD-10-CM | POA: Diagnosis not present

## 2022-12-30 DIAGNOSIS — H02421 Myogenic ptosis of right eyelid: Secondary | ICD-10-CM | POA: Diagnosis not present

## 2022-12-30 DIAGNOSIS — H02412 Mechanical ptosis of left eyelid: Secondary | ICD-10-CM | POA: Diagnosis not present

## 2022-12-30 MED ORDER — IOHEXOL 350 MG/ML SOLN
100.0000 mL | Freq: Once | INTRAVENOUS | Status: AC | PRN
  Administered 2022-12-30: 100 mL via INTRAVENOUS

## 2022-12-31 ENCOUNTER — Other Ambulatory Visit (HOSPITAL_COMMUNITY): Payer: Medicare Other

## 2023-01-01 ENCOUNTER — Ambulatory Visit (INDEPENDENT_AMBULATORY_CARE_PROVIDER_SITE_OTHER): Payer: Medicare Other | Admitting: Neurology

## 2023-01-01 ENCOUNTER — Encounter: Payer: Self-pay | Admitting: Neurology

## 2023-01-01 VITALS — BP 132/73 | HR 52 | Ht 73.0 in | Wt 214.0 lb

## 2023-01-01 DIAGNOSIS — G894 Chronic pain syndrome: Secondary | ICD-10-CM | POA: Insufficient documentation

## 2023-01-01 DIAGNOSIS — G620 Drug-induced polyneuropathy: Secondary | ICD-10-CM | POA: Insufficient documentation

## 2023-01-01 DIAGNOSIS — Z77098 Contact with and (suspected) exposure to other hazardous, chiefly nonmedicinal, chemicals: Secondary | ICD-10-CM

## 2023-01-01 DIAGNOSIS — G6289 Other specified polyneuropathies: Secondary | ICD-10-CM | POA: Diagnosis not present

## 2023-01-01 DIAGNOSIS — D62 Acute posthemorrhagic anemia: Secondary | ICD-10-CM

## 2023-01-01 MED ORDER — GABAPENTIN 100 MG PO CAPS: 100.0000 mg | ORAL_CAPSULE | Freq: Every day | ORAL | 1 refills | Status: DC

## 2023-01-01 NOTE — Progress Notes (Signed)
Provider:  Melvyn Novas, MD  Primary Care Physician:  [name], MD [address] [place] [address]     Referring Provider: Emilio Aspen, Md [address] [place],  [place] [address]          Chief Complaint according to patient   Patient presents with:                HISTORY OF PRESENT ILLNESS:  Sean Stanley is a 82 y.o. male patient who is here for revisit 01/01/2023 for INSOMNIA ? Pain?  .  Chief concern according to patient :  I don't sleep , its insomnia- not apnea -that bothers me. Ear pain without Tinnitus.  With the cancer I have been more immunosuppressed, caught COVID and still feel sore and fatigued, BPH  leading to nocturia.  He has a peripheral neuropathy for years, now worsening with amiodarone. He confessed he has been never taking GABApentin, which was prescribed on and off over many years. He wakes up a lot and the goes to the bathroom, but its isn't  the urge to urinate.  His wife, a retired Engineer, civil (consulting) and she wants him to be able to sleep better.  Epworth Sleepiness Scale today was endorsed at 17 out of 24 points so this is significant and has to be looked at in context with the poor sleep quality at night.  Fatigue severity scale was 27 out of 63 and cannot as high.  The patient has no longer needed CPAP after last HST.     Here after hospitalization,   Sean Stanley is a 82 y.o. male patient who is here for revisit 05/22/2022 for  follow up from recent atrial fibrillation, hospitalization. Is followed by Dr Elberta Fortis and now in rhythm again, on Amiodarone. I reviewed several remote reports of his device- he had one 4 hours episode of atrial fib on 03-13-2022. On eloquis and no recent epistaxis.    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    Patient with progressive  Agent orange  induced neuropathy, progression is mostly felt I his feet. At night he is bothered the most. Has tried topical and tylenol, not much relief.  I suggested a compression sock to see if that gives relief.   Expand All Collapse All    I have the pleasure of seeing Mr. Sean Stanley again on 05-17-2020-  A 82 year old  Caucasian gentleman, who had a another rough year.  His neuropathy is progression, from the toes ascending above the ankle, pain keeps him awake at night. 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  July 2020 completion of his chemotherapy and now considered in remission.  In July 2021 he was still in remission, had a port- a -cath removed. [facility] [place]- treatable , not cureable.    He takes Tylenol and CBD oil skin treatment. Tried Voltaren - he is only in pain at rest and not while up and about. Left side worse  then the right. Creepy crawly sensation- RLS ? He massages it to get relief.     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  EMG evaluation of the right lower extremity shows chronic stable distal signs of neuropathic denervation consistent with the diagnosis of peripheral neuropathy.  There is no evidence of an overlying lumbosacral radiculopathy. Sean Palau MD 09/02/2017 4:06 PM       He has hand tingling now as well, indicating progression. Amiodarone has a potential to contribute to this. He has dreamt more vividly, nightmarish.       Review of Systems: Out of a complete 14 system  review, the patient complains of only the following symptoms, and all other reviewed systems are negative.:  Fatigue, sleepiness , snoring, fragmented sleep, Insomnia, Agent orange NP- Nocturia    How likely are you to doze in the following situations: 0 = not likely, 1 = slight chance, 2 = moderate chance, 3 = high chance   Sitting and Reading? Watching Television? Sitting inactive in a public place (theater or meeting)? As a passenger in a car for an hour without a break? Lying down in the afternoon when circumstances permit? Sitting and talking to someone? Sitting quietly after lunch without alcohol? In a car, while stopped for a few minutes in traffic?   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  Tobacco Use   Smoking status: Never   Smokeless tobacco: Never   Tobacco comments:    Never smoke 04/10/22  Vaping Use   Vaping status: Never Used  Substance and Sexual Activity   Alcohol use: Not Currently    Comment: social   Drug use: No   Sexual activity: Not on file  Other Topics Concern   Not on file  Social History Narrative   Lives in [place], Retired   Chief Executive Officer Determinants of Corporate investment banker Strain: Not on file  Food Insecurity: Not on file  Transportation Needs: Not on file  Physical Activity: Not on file  Stress: Not on file  Social Connections: Not on file    Family History  Problem Relation Age of Onset   Parkinson's disease Mother 67   Heart failure Father 43   Cancer Maternal Grandmother    Heart attack Maternal Grandfather    Heart attack Paternal Grandmother    Heart attack Paternal Grandfather     Past Medical History:  Diagnosis Date   Abdominal aortic aneurysm, ruptured (HCC) 2014    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, IR could not access culprit lesion and treated with anticoag reversal; no AAA noted on 06/13/17 CTA   Aneurysm artery, celiac (HCC)    followed at [facility]   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 [facility])    [facility]   Aneurysm of splenic artery (HCC) 2014   s/p coiling 12/16/12 - [facility]   Atrial flutter (HCC)    BPH (benign prostatic hyperplasia)    Cancer (HCC)    melanoma on lower right back and left chest - surgically removed and cleared  Difficult intubation    Dysrhythmia    H/O agent Orange exposure    Headache    migraine- not current   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 Earl Gala told him he had improved   Paroxysmal atrial fibrillation (HCC) 01/02/2021   Pneumonia    Thoracic ascending aortic aneurysm (HCC)    4.5 cm 03/2018 CT. 4.4 cm on echo 10/2021 and on CTA 03/2021   Thyroid cancer [facility])     Past Surgical History:  Procedure Laterality Date   ABDOMINAL ANGIOGRAM  08/05/2012   aneurysm repair     ATRIAL FIBRILLATION ABLATION N/A 10/08/2022   Procedure: ATRIAL FIBRILLATION ABLATION;  Surgeon: Regan Lemming, MD;  Location: [facility] INVASIVE CV LAB;  Service: Cardiovascular;  Laterality: N/A;   CARDIOVERSION  02/12/2012   Procedure: CARDIOVERSION;  Surgeon: [name], MD;  Location: [facility] ENDOSCOPY;  Service: Cardiovascular;  Laterality: N/A;   CARDIOVERSION N/A 06/22/2013   Procedure: CARDIOVERSION;  Surgeon: Lars Masson, MD;  Location: [facility] ENDOSCOPY;  Service: Cardiovascular;  Laterality: N/A;   CARDIOVERSION N/A 06/19/2021   Procedure: CARDIOVERSION;   Surgeon: Christell Constant, MD;  Location: [facility] ENDOSCOPY;  Service: Cardiovascular;  Laterality: N/A;   CATARACT EXTRACTION Bilateral 2018   with lens implant   CHOLECYSTECTOMY     COLONOSCOPY     ESOPHAGOGASTRODUODENOSCOPY     implantable loop recorder placement  03/29/2021   Medtronic Reveal Linq model LNQ 22 [id number]) implantable loop recorder   IR IMAGING GUIDED PORT INSERTION  05/26/2018   IR REMOVAL TUN ACCESS W/ PORT W/O FL MOD SED  02/05/2021   LYMPH NODE BIOPSY Left 04/27/2018   Procedure: EXCISIONAL BIOPSY OF LEFT CERVICAL LYMPH NODE;  Surgeon: Christia Reading, MD;  Location: [facility] OR;  Service: ENT;  Laterality: Left;   LYMPH NODE BIOPSY Left 11/23/2018   Procedure: LEFT CERVICAL LYMPH NODE OPEN BIOPSY;  Surgeon: Christia Reading, MD;  Location: [facility] OR;  Service: ENT;  Laterality: Left;   MENISCUS REPAIR Right 2009   MITRAL VALVE REPAIR  2002   [facility]   NM MYOVIEW LTD  07/22/2006   no ischemia   RADICAL NECK DISSECTION Left 01/15/2019   Procedure: LEFT NECK DISSECTION;  Surgeon: Christia Reading, MD;  Location: [facility] OR;  Service: ENT;  Laterality: Left;   RIGHT HEART CATH  06/19/2004   normal right heart dynamics. EF 50%   TEE WITHOUT CARDIOVERSION  02/12/2012   Procedure: TRANSESOPHAGEAL ECHOCARDIOGRAM (TEE);  Surgeon: [name], MD;  Location: [facility] ENDOSCOPY;  Service: Cardiovascular;  Laterality: N/A;   TEE WITHOUT CARDIOVERSION N/A 06/22/2013   Procedure: TRANSESOPHAGEAL ECHOCARDIOGRAM (TEE);  Surgeon: Lars Masson, MD;  Location: [facility] ENDOSCOPY;  Service: Cardiovascular;  Laterality: N/A;   THYROIDECTOMY N/A 01/15/2019   Procedure: TOTAL THYROIDECTOMY;  Surgeon: Christia Reading, MD;  Location: [facility] OR;  Service: ENT;  Laterality: N/A;     Current Outpatient Medications on File Prior to Visit  Medication Sig Dispense Refill   acetaminophen (TYLENOL) 500 MG tablet Take 1,000 mg by mouth daily as needed for moderate pain.     alfuzosin (  UROXATRAL) 10 MG 24 hr tablet Take 10  mg by mouth every other day. Alternate with Finasteride the evening     amiodarone (PACERONE) 200 MG tablet Take 1 tablet (200 mg total) by mouth daily. 90 tablet 1   apixaban (ELIQUIS) 5 MG TABS tablet Take 1 tablet (5 mg total) by mouth 2 (two) times daily. 60 tablet 3   betamethasone dipropionate 0.05 % cream Apply 1 application  topically daily as needed (mosquito bites).     buPROPion (WELLBUTRIN XL) 150 MG 24 hr tablet Take 1 tablet (150 mg total) by mouth daily. 90 tablet 3   Cholecalciferol (VITAMIN D) 2000 units tablet Take 2,000 Units by mouth at bedtime.      ezetimibe (ZETIA) 10 MG tablet Take 1 tablet (10 mg total) by mouth every morning. 90 tablet 2   finasteride (PROSCAR) 5 MG tablet Take 5 mg by mouth every other day. Alternate with Alfuzosin in the evening     levothyroxine (SYNTHROID) 175 MCG tablet Take 1 tablet (175 mcg total) by mouth daily.     lidocaine (LMX) 4 % cream Apply 1 Application topically daily as needed (peripheral neuropathy in feet).     lidocaine-prilocaine (EMLA) cream Apply to affected area once before sleep time- 30 g 3   metoprolol succinate (TOPROL-XL) 25 MG 24 hr tablet Take 12.5 mg by mouth daily.     Polyethyl Glycol-Propyl Glycol (SYSTANE HYDRATION PF OP) Place 1 drop into both eyes 3 (three) times daily as needed (Dry eyes).     rosuvastatin (CRESTOR) 10 MG tablet Take 1 tablet (10 mg total) by mouth every Monday, Wednesday, and Friday. 36 tablet 3   COVID-19 mRNA vaccine 2023-2024 (COMIRNATY) syringe Inject into the muscle. (Patient not taking: Reported on 11/05/2022) 0.3 mL 0   influenza vaccine adjuvanted (FLUAD) 0.5 ML injection Inject into the muscle. (Patient not taking: Reported on 11/05/2022) 0.5 mL 0   No current facility-administered medications on file prior to visit.    Allergies  Allergen Reactions   Levaquin [Levofloxacin In D5w]     tendonitis   Nickel Itching   Pravastatin     Muscle aches   Simvastatin     Chest pain    Allopurinol Rash   Ampicillin Rash    Did it involve swelling of the face/tongue/throat, SOB, or low BP? No Did it involve sudden or severe rash/hives, skin peeling, or any reaction on the inside of your mouth or nose? Yes Did you need to seek medical attention at a hospital or doctor's office? Yes When did it last happen?      50+ years If all above answers are "NO", may proceed with cephalosporin use.    Penicillin G Rash     DIAGNOSTIC DATA (LABS, IMAGING, TESTING) - I reviewed patient records, labs, notes, testing and imaging myself where available.  Lab Results  Component Value Date   WBC 9.4 10/10/2022   HGB 13.0 10/10/2022   HCT 39.4 10/10/2022   MCV 94.7 10/10/2022   PLT 179 10/10/2022      Component Value Date/Time   NA 139 10/10/2022 1031   NA 140 09/19/2022 0746   K 4.7 10/10/2022 1031   CL 105 10/10/2022 1031   CO2 27 10/10/2022 1031   GLUCOSE 84 10/10/2022 1031   BUN 34 (H) 10/10/2022 1031   BUN 34 (H) 09/19/2022 0746   CREATININE 1.48 (H) 10/10/2022 1031   CREATININE 1.43 (H) 04/11/2022 1000   CALCIUM 9.0 10/10/2022 1031  PROT 6.3 (L) 10/10/2022 1031   PROT 6.0 09/03/2021 0749   ALBUMIN 4.1 10/10/2022 1031   ALBUMIN 4.3 09/03/2021 0749   AST 23 10/10/2022 1031   AST 20 04/11/2022 1000   ALT 17 10/10/2022 1031   ALT 28 04/11/2022 1000   ALKPHOS 52 10/10/2022 1031   BILITOT 1.0 10/10/2022 1031   BILITOT 1.3 (H) 04/11/2022 1000   GFRNONAA 47 (L) 10/10/2022 1031   GFRNONAA 50 (L) 04/11/2022 1000   GFRAA >60 11/22/2019 1023   Lab Results  Component Value Date   CHOL 160 10/29/2022   HDL 73 10/29/2022   LDLCALC 75 10/29/2022   TRIG 60 10/29/2022   CHOLHDL 2.2 10/29/2022   No results found for: "HGBA1C" Lab Results  Component Value Date   VITAMINB12 355 05/24/2019   Lab Results  Component Value Date   TSH 0.673 03/23/2022    PHYSICAL EXAM:  Today's Vitals   01/01/23 1259  BP: 132/73  Pulse: (!) 52  Weight: 214 lb (97.1 kg)  Height: 6'  1" (1.854 m)   Body mass index is 28.23 kg/m.   Wt Readings from Last 3 Encounters:  01/01/23 214 lb (97.1 kg)  11/05/22 216 lb 3.2 oz (98.1 kg)  10/10/22 220 lb 6.4 oz (100 kg)     Ht Readings from Last 3 Encounters:  01/01/23 6\' 1"  (1.854 m)  11/05/22 6\' 1"  (1.854 m)  10/10/22 6\' 1"  (1.854 m)      General: The patient is awake, alert and appears not in acute distress. The patient is well groomed. Head: Normocephalic, atraumatic. Neck is supple. neck circumference:17.5 inches . Nasal airflow  patent.  Retrognathia is not seen.  Dental status:  Cardiovascular:  Regular rate and cardiac rhythm by pulse,  without distended neck veins. Respiratory: Lungs are clear to auscultation.  Skin:  Without evidence of ankle edema, or rash. Trunk: The patient's posture is erect.   NEUROLOGIC EXAM: The patient is awake and alert, oriented to place and time.   Memory subjective described as impaired;       05/22/2022   10:31 AM 05/22/2021   10:52 AM 11/15/2020   10:27 AM 04/30/2018   10:30 AM  Montreal Cognitive Assessment   Visuospatial/ Executive (0/5) 4 5 4 5   Naming (0/3) 3 3 3 3   Attention: Read list of digits (0/2) 2 2 2 2   Attention: Read list of letters (0/1) 1 1 1 1   Attention: Serial 7 subtraction starting at 100 (0/3) 3 3 3 3   Language: Repeat phrase (0/2) 2 2 2 2   Language : Fluency (0/1) 0 1 0 1  Abstraction (0/2) 2 2 2 2   Delayed Recall (0/5) 4 3 3  0  Orientation (0/6) 6 6 6 6   Total 27 28 26 25     Attention span & concentration ability appears normal.  Speech is fluent, with prominent  dysphonia  Mood and affect are depressed,    Cranial nerves: no loss of smell or taste reported  Pupils are equal and briskly reactive to light. Funduscopic exam deferred..  Extraocular movements in vertical and horizontal planes were intact and without nystagmus. No Diplopia. Visual fields by finger perimetry are intact. Hearing was intact to soft voice and finger rubbing.    Facial  sensation intact to fine touch.  Facial motor strength is symmetric and tongue and uvula move midline.  Neck ROM : rotation, tilt and flexion extension were normal for age and shoulder shrug was symmetrical.  Neuropathy reaching mid-calf and hands.     ASSESSMENT AND PLAN 82 y.o. year old male  here with:    1) good memory scores for many years , I see no need to do more than once a year a MOCA.   2) Insomnia  because his mind is working, worried, anxious, and in addition having frequent nocturia.   3) chronic Pain due to neuropathy agent orange exposure : I asked him to take gabapentin- this time  100 mg at bedtime for one week, and if no effect and no side effect- increase to 200 mg. Zettie Cooley me a report if this helps, if not we can try 300 mg , or change to just an insomnia drug: I offered lunesta 1 mg at night. Chronic pain management  was requested.   3) Amiodarone is neurotoxic , but if his recent ablation has a desired effect, he should be able to wean off.     I plan to follow up either personally or through our NP within 6 months.   I would like to thank Dr Elberta Fortis and  Emilio Aspen, Md [address] [place],  [place] [address] for allowing me to meet with and to take care of this pleasant patient.   CC: I will share my notes with Dr Elberta Fortis, Dr Doroteo Bradford .  After spending a total time of  40  minutes face to face and additional time for physical and neurologic examination, review of laboratory studies,  personal review of imaging studies, reports and results of other testing and review of referral information / records as far as provided in visit,   Electronically signed by: Melvyn Novas, MD 01/01/2023 1:47 PM  [facility] Board certified by The [employer] of Sleep Medicine and Diplomate of the [employer] of Sleep Medicine. Board certified In Neurology through the ABPN, Fellow of the [employer] of  Neurology.

## 2023-01-01 NOTE — Patient Instructions (Signed)
1) good memory scores for many years , I see no need to do more than once a year a MOCA. Subjective memory impairment maybe much more an effect from insomnia.   2) Insomnia  because his mind is working, worried, anxious, and in addition having frequent nocturia.   3) chronic Pain due to neuropathy agent orange exposure : I asked him to take gabapentin- this time  100 mg at bedtime for one week, and if no effect and no side effect- increase to 200 mg. Zettie Cooley me a report if this helps, if not we can try 300 mg , or change to just an insomnia drug: I offered lunesta 1 mg at night. Chronic pain management  was requested.   4) Amiodarone is neurotoxic , but if his recent ablation has a desired effect, he should be able to wean off.

## 2023-01-02 DIAGNOSIS — M1711 Unilateral primary osteoarthritis, right knee: Secondary | ICD-10-CM | POA: Diagnosis not present

## 2023-01-03 ENCOUNTER — Other Ambulatory Visit: Payer: Self-pay | Admitting: Internal Medicine

## 2023-01-03 ENCOUNTER — Ambulatory Visit
Admission: RE | Admit: 2023-01-03 | Discharge: 2023-01-03 | Disposition: A | Discharge status: Home / Self Care | Payer: Medicare Other | Source: Ambulatory Visit | Attending: Internal Medicine | Admitting: Internal Medicine

## 2023-01-03 DIAGNOSIS — R051 Acute cough: Secondary | ICD-10-CM | POA: Diagnosis not present

## 2023-01-03 DIAGNOSIS — U071 COVID-19: Secondary | ICD-10-CM | POA: Diagnosis not present

## 2023-01-03 DIAGNOSIS — R062 Wheezing: Secondary | ICD-10-CM | POA: Diagnosis not present

## 2023-01-03 DIAGNOSIS — R059 Cough, unspecified: Secondary | ICD-10-CM | POA: Diagnosis not present

## 2023-01-06 DIAGNOSIS — H53483 Generalized contraction of visual field, bilateral: Secondary | ICD-10-CM | POA: Diagnosis not present

## 2023-01-06 DIAGNOSIS — H43813 Vitreous degeneration, bilateral: Secondary | ICD-10-CM | POA: Diagnosis not present

## 2023-01-06 DIAGNOSIS — Z961 Presence of intraocular lens: Secondary | ICD-10-CM | POA: Diagnosis not present

## 2023-01-06 DIAGNOSIS — H04123 Dry eye syndrome of bilateral lacrimal glands: Secondary | ICD-10-CM | POA: Diagnosis not present

## 2023-01-06 DIAGNOSIS — H40013 Open angle with borderline findings, low risk, bilateral: Secondary | ICD-10-CM | POA: Diagnosis not present

## 2023-01-06 NOTE — Telephone Encounter (Signed)
error 

## 2023-01-08 ENCOUNTER — Encounter: Payer: Self-pay | Admitting: Cardiology

## 2023-01-08 ENCOUNTER — Encounter: Payer: Self-pay | Admitting: Hematology and Oncology

## 2023-01-09 ENCOUNTER — Ambulatory Visit: Payer: Medicare Other | Admitting: Cardiology

## 2023-01-09 ENCOUNTER — Encounter: Payer: Self-pay | Admitting: Physical Medicine and Rehabilitation

## 2023-01-09 DIAGNOSIS — M1711 Unilateral primary osteoarthritis, right knee: Secondary | ICD-10-CM | POA: Diagnosis not present

## 2023-01-13 ENCOUNTER — Telehealth: Payer: Self-pay

## 2023-01-13 NOTE — Telephone Encounter (Signed)
Called and left a message that Dr. Bertis Ruddy is wanting to make sure that they saw her mychart message that the CT scan was normal. Ask for a call back.

## 2023-01-16 DIAGNOSIS — C44319 Basal cell carcinoma of skin of other parts of face: Secondary | ICD-10-CM | POA: Diagnosis not present

## 2023-01-16 DIAGNOSIS — Z85828 Personal history of other malignant neoplasm of skin: Secondary | ICD-10-CM | POA: Diagnosis not present

## 2023-01-17 DIAGNOSIS — R053 Chronic cough: Secondary | ICD-10-CM | POA: Diagnosis not present

## 2023-01-19 ENCOUNTER — Encounter: Payer: Self-pay | Admitting: Cardiovascular Disease

## 2023-01-19 NOTE — Progress Notes (Unsigned)
Cardiology Office Note:  .   Date:  01/20/2023  ID:  Sean Stanley, DOB September 13, 1940, MRN [id number] PCP: [name], MD  [facility] Providers Cardiologist:  None Electrophysiologist:  Will Jorja Loa, MD    History of Present Illness: .  Oct. 28, 2024   Sean Stanley is a 82 y.o. male , previous patient of Dr. Katrinka Blazing, Dr.  Shari Prows,  Seen with wife, Sean Stanley    I am seeing him for the first time today  Hx of MV repair ( at [facility])  Atrial flutter Multiple aortic aneurysms Had spontaneous rupture of a renal artery aneurims   Hx of mantle cell B non Hodgkins lymphoma   S/p recent  Afib ablation by Dr. 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 The neuropathy has been gradually worseing. 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  He will be able to hold his Eliquis for 2 to 3 days prior to the procedure if he decides to proceed.       ROS:   Studies Reviewed: .         Risk Assessment/Calculations:    CHA2DS2-VASc Score = 2  This indicates a 2.2% annual risk of stroke. The patient's score is based upon: CHF History: 0 HTN History: 0 Diabetes History: 0 Stroke History: 0 Vascular Disease History: 0 Age Score: 2 Gender  Score: 0    HYPERTENSION CONTROL Vitals:   01/20/23 1104 01/20/23 1138  BP: (!) 164/82 (!) 145/84    The patient's blood pressure is elevated above target today.  In order to address the patient's elevated BP: Blood pressure will be monitored at home to determine if medication changes need to be made.          Physical Exam:   VS:  BP (!) 145/84   Pulse 67   Ht 6\' 1"  (1.854 m)   Wt 216 lb 9.6 oz (98.2 kg)   SpO2 97%   BMI 28.58 kg/m    Wt Readings from Last 3 Encounters:  01/20/23 216 lb 9.6 oz (98.2 kg)  01/01/23 214 lb (97.1 kg)  11/05/22 216 lb 3.2 oz (98.1 kg)    GEN: Well nourished, well developed in no acute distress NECK: No JVD; No carotid bruits CARDIAC: RRR, no murmurs, rubs, gallops RESPIRATORY:  Clear to auscultation without rales, wheezing or rhonchi  ABDOMEN: Soft, non-tender, non-distended EXTREMITIES:  trace ankle edema ,    No deformity   ASSESSMENT AND PLAN: .       1.  Paroxysmal atrial fibrillation: She is status post ablation.  He remains on Eliquis.  He has multiple aortic aneurysms.  He has a history of a ruptured aneurysm in his renal artery which was nearly life-threatening.  I will let Dr. Elberta Fortis and EP team decide how long to continue his Eliquis.  He is off his amiodarone and seems to be doing well.  2.  Mitral valve repair: He seems to be doing well.  I see him again in 6 months.      Dispo: 6 months    Signed, Kristeen Miss, MD

## 2023-01-20 ENCOUNTER — Ambulatory Visit: Payer: Medicare Other | Attending: Cardiology | Admitting: Cardiovascular Disease

## 2023-01-20 ENCOUNTER — Encounter: Payer: Self-pay | Admitting: Cardiovascular Disease

## 2023-01-20 ENCOUNTER — Ambulatory Visit (INDEPENDENT_AMBULATORY_CARE_PROVIDER_SITE_OTHER): Payer: Medicare Other

## 2023-01-20 VITALS — BP 145/84 | HR 67 | Ht 73.0 in | Wt 216.6 lb

## 2023-01-20 DIAGNOSIS — I4819 Other persistent atrial fibrillation: Secondary | ICD-10-CM | POA: Diagnosis not present

## 2023-01-20 DIAGNOSIS — Z9889 Other specified postprocedural states: Secondary | ICD-10-CM | POA: Diagnosis not present

## 2023-01-20 NOTE — Patient Instructions (Signed)
Follow-Up: At [facility], you and your health needs are our priority.  As part of our continuing mission to provide you with exceptional heart care, we have created designated Provider Care Teams.  These Care Teams include your primary Cardiologist (physician) and Advanced Practice Providers (APPs -  Physician Assistants and Nurse Practitioners) who all work together to provide you with the care you need, when you need it.  Your next appointment:   6 month(s)  Provider:   Kristeen Miss, MD

## 2023-01-21 LAB — CUP PACEART REMOTE DEVICE CHECK
Date Time Interrogation Session: 20241027230759
Implantable Pulse Generator Implant Date: 20230105

## 2023-01-22 DIAGNOSIS — I728 Aneurysm of other specified arteries: Secondary | ICD-10-CM | POA: Diagnosis not present

## 2023-01-22 DIAGNOSIS — I7121 Aneurysm of the ascending aorta, without rupture: Secondary | ICD-10-CM | POA: Diagnosis not present

## 2023-01-22 DIAGNOSIS — I722 Aneurysm of renal artery: Secondary | ICD-10-CM | POA: Diagnosis not present

## 2023-01-31 ENCOUNTER — Ambulatory Visit: Payer: Medicare Other | Attending: Cardiology | Admitting: Physician Assistant

## 2023-01-31 VITALS — BP 120/60 | HR 68 | Ht 73.0 in | Wt 220.0 lb

## 2023-01-31 DIAGNOSIS — I428 Other cardiomyopathies: Secondary | ICD-10-CM | POA: Diagnosis not present

## 2023-01-31 DIAGNOSIS — Z95818 Presence of other cardiac implants and grafts: Secondary | ICD-10-CM | POA: Insufficient documentation

## 2023-01-31 DIAGNOSIS — Z9889 Other specified postprocedural states: Secondary | ICD-10-CM | POA: Insufficient documentation

## 2023-01-31 DIAGNOSIS — I480 Paroxysmal atrial fibrillation: Secondary | ICD-10-CM | POA: Insufficient documentation

## 2023-01-31 DIAGNOSIS — D6869 Other thrombophilia: Secondary | ICD-10-CM | POA: Diagnosis not present

## 2023-01-31 NOTE — Patient Instructions (Signed)
Medication Instructions:  Your physician recommends that you continue on your current medications as directed. Please refer to the Current Medication list given to you today. *If you need a refill on your cardiac medications before your next appointment, please call your pharmacy*   Lab Work: None ordered   Testing/Procedures: None ordered   Follow-Up: At [facility], you and your health needs are our priority.  As part of our continuing mission to provide you with exceptional heart care, we have created designated Provider Care Teams.  These Care Teams include your primary Cardiologist (physician) and Advanced Practice Providers (APPs -  Physician Assistants and Nurse Practitioners) who all work together to provide you with the care you need, when you need it.  We recommend signing up for the patient portal called "MyChart".  Sign up information is provided on this After Visit Summary.  MyChart is used to connect with patients for Virtual Visits (Telemedicine).  Patients are able to view lab/test results, encounter notes, upcoming appointments, etc.  Non-urgent messages can be sent to your provider as well.   To learn more about what you can do with MyChart, go to [email or web address].    Your next appointment:   3-4 month(s)  Provider:   Loman Brooklyn, MD    Other Instructions

## 2023-01-31 NOTE — Progress Notes (Signed)
Cardiology Office Note Date:  01/31/2023  Patient ID:  Sean, Stanley 08-20-1940, MRN [id number] PCP:  Sean Cabal, MD  Cardiologist:  Sean Stanley >> Sean Stanley Electrophysiologist: Sean Stanley    Chief Complaint:   3 mo post ablation visit  History of Present Illness: Sean Stanley is a 82 y.o. 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 He is currently in remission for both of his cancers. He has residual vocal cord dysfunction.  --TTE at [facility] 10/2019 that showed mild left ventricle dysfunction with LVEF of 45 to 50%  -- TTE 10/2021 with LVEF 50-55%, normal RV, severe biatrial enlargement, normal functioning MVR with no MS/MR, mild aortic root and ascending aortic dilation at 40mm and 44mm respectively.   Seeing cardiology, myself, Afib clinic and Sean Stanley on a number of occasions  Symptomatic Afib, though fairly low burden by his device  He saw Sean Stanley 05/29/22, wanted to avoid AAD, planned for ablation  Saw the AFib clinic post procedure, as usual  no procedureal complications/complaints.  Terrible dreams on amiodarone, hoping to be able to stop at his 3 mo post op appt, maintaining SR  He saw Sean Stanley 01/20/23, limited by peripheral neuropathy, off amiodarone, contemplating vocal cord surgery No changes were made  TODAY Doing OK Still having trouble sleeping/bad dreams [name] he never really slept well, but worse after the amiodarone Also noted a slight tremor in his hands with the amiodarone Hopes both will improve as it washes out   No CP, palpitations He has not felt any  AF No SOB' No near syncope or syncope  Would like to try and minimize his medicines Asks about stopping metoprolol and Eliquis  Device information Diagnosed 2013 AFlutter ablation July 2015 (by [facility] notes) PVI ablation 10/08/22 (Sean Stanley) Device information MDT LINQ II, implanted 03/29/2021 AAD Hx Amiodarone started Jan 2024 > stopped Oct 2024  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 [facility])    being followed at [facility]   Aneurysm of splenic artery (HCC) 2014   s/p coiling 12/16/12 - [facility]   Atrial flutter (HCC)    BPH (benign prostatic hyperplasia)    Cancer (HCC)    melanoma on lower right back and left chest - surgically removed and cleared   Difficult intubation    Dysrhythmia    H/O agent Orange exposure    Headache    migraine- not current   High bilirubin    pt states it's genetic   History of blood transfusion    Hypercholesteremia    Hypercholesterolemia    Lymphoma (HCC) 04/2018   Mitral valve disease    annuloplasty 2002 [facility]   Neuropathy    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 Earl Stanley told him he had improved   Paroxysmal atrial fibrillation (HCC) 01/02/2021   Pneumonia    Thoracic ascending aortic aneurysm (HCC)    4.5 cm 03/2018 CT. 4.4 cm on echo 10/2021 and on CTA 03/2021   Thyroid cancer [facility])     Past Surgical History:  Procedure Laterality Date   ABDOMINAL ANGIOGRAM  08/05/2012   aneurysm repair     ATRIAL FIBRILLATION ABLATION N/A 10/08/2022   Procedure: ATRIAL FIBRILLATION ABLATION;  Surgeon: Sean Lemming, MD;   Location: [facility] INVASIVE CV LAB;  Service: Cardiovascular;  Laterality: N/A;   CARDIOVERSION  02/12/2012   Procedure: CARDIOVERSION;  Surgeon: Sean Nose, MD;  Location: [facility] ENDOSCOPY;  Service: Cardiovascular;  Laterality: N/A;   CARDIOVERSION N/A 06/22/2013   Procedure: CARDIOVERSION;  Surgeon: Sean Masson, MD;  Location[facility] ENDOSCOPY;  Service: Cardiovascular;  Laterality: N/A;   CARDIOVERSION N/A 06/19/2021   Procedure: CARDIOVERSION;  Surgeon: Sean Constant, MD;  Location: [facility] ENDOSCOPY;  Service: Cardiovascular;  Laterality: N/A;   CATARACT EXTRACTION Bilateral 2018   with lens implant   CHOLECYSTECTOMY     COLONOSCOPY     ESOPHAGOGASTRODUODENOSCOPY     implantable loop recorder placement  03/29/2021   Medtronic Reveal Linq model LNQ [id number] [id number] G) implantable loop recorder   IR IMAGING GUIDED PORT INSERTION  05/26/2018   IR REMOVAL TUN ACCESS W/ PORT W/O FL MOD SED  02/05/2021   LYMPH NODE BIOPSY Left 04/27/2018   Procedure: EXCISIONAL BIOPSY OF LEFT CERVICAL LYMPH NODE;  Surgeon: Sean Reading, MD;  [facility] OR;  Service: ENT;  Laterality: Left;   LYMPH NODE BIOPSY Left 11/23/2018   Procedure: LEFT CERVICAL LYMPH NODE OPEN BIOPSY;  Surgeon: Sean Reading, MD;  Location: [facility] OR;  Service: ENT;  Laterality: Left;   MENISCUS REPAIR Right 2009   MITRAL VALVE REPAIR  2002   [facility]   NM MYOVIEW LTD  07/22/2006   no ischemia   RADICAL NECK DISSECTION Left 01/15/2019   Procedure: LEFT NECK DISSECTION;  Surgeon: Sean Reading, MD;  Location: [facility] OR;  Service: ENT;  Laterality: Left;   RIGHT HEART CATH  06/19/2004   normal right heart dynamics. EF 50%   TEE WITHOUT CARDIOVERSION  02/12/2012   Procedure: TRANSESOPHAGEAL ECHOCARDIOGRAM (TEE);  Surgeon: Sean Nose, MD;  Location: [facility] ENDOSCOPY;  Service: Cardiovascular;  Laterality: N/A;   TEE WITHOUT CARDIOVERSION N/A 06/22/2013   Procedure: TRANSESOPHAGEAL ECHOCARDIOGRAM (TEE);  Surgeon: Sean Masson, MD;   Location: [facility] ENDOSCOPY;  Service: Cardiovascular;  Laterality: N/A;   THYROIDECTOMY N/A 01/15/2019   Procedure: TOTAL THYROIDECTOMY;  Surgeon: Sean Reading, MD;  Location: [facility] OR;  Service: ENT;  Laterality: N/A;    Current Outpatient Medications  Medication Sig Dispense Refill   acetaminophen (TYLENOL) 500 MG tablet Take 1,000 mg by mouth daily as needed for moderate pain.     alfuzosin (UROXATRAL) 10 MG 24 hr tablet Take 10 mg by mouth every other day. Alternate with Finasteride the evening     apixaban (ELIQUIS) 5 MG TABS tablet Take 1 tablet (5 mg total) by mouth 2 (two) times daily. 60 tablet 3   benzonatate (TESSALON) 100 MG capsule Take 100 mg by mouth 3 (three) times daily as needed.     betamethasone dipropionate 0.05 % cream Apply 1 application  topically daily as needed (mosquito bites).     buPROPion (WELLBUTRIN XL) 150 MG 24 hr tablet Take 1 tablet (  150 mg total) by mouth daily. 90 tablet 3   buPROPion (ZYBAN) 150 MG 12 hr tablet Take 1 tablet twice a day by oral route. (Patient not taking: Reported on 01/20/2023)     Cholecalciferol (VITAMIN D) 2000 units tablet Take 2,000 Units by mouth at bedtime.      COVID-19 mRNA vaccine 2023-2024 (COMIRNATY) syringe Inject into the muscle. 0.3 mL 0   doxycycline (VIBRAMYCIN) 100 MG capsule Take 100 mg by mouth 2 (two) times daily.     ezetimibe (ZETIA) 10 MG tablet Take 1 tablet (10 mg total) by mouth every morning. 90 tablet 2   finasteride (PROSCAR) 5 MG tablet Take 5 mg by mouth every other day. Alternate with Alfuzosin in the evening     levothyroxine (SYNTHROID) 175 MCG tablet Take 1 tablet (175 mcg total) by mouth daily.     lidocaine (LMX) 4 % cream Apply 1 Application topically daily as needed (peripheral neuropathy in feet).     lidocaine-prilocaine (EMLA) cream Apply to affected area once before sleep time- 30 g 3   metoprolol succinate (TOPROL-XL) 25 MG 24 hr tablet Take 12.5 mg by mouth daily.     Polyethyl Glycol-Propyl Glycol  (SYSTANE HYDRATION PF OP) Place 1 drop into both eyes 3 (three) times daily as needed (Dry eyes).     predniSONE (STERAPRED UNI-PAK 21 TAB) 10 MG (21) TBPK tablet Take by mouth.     rosuvastatin (CRESTOR) 5 MG tablet Take 5 mg by mouth daily.     No current facility-administered medications for this visit.    Allergies:   Levaquin [levofloxacin in d5w], Nickel, Pravastatin, Simvastatin, Allopurinol, Ampicillin, and Penicillin g   Social History:  The patient  reports that he has never smoked. He has never used smokeless tobacco. He reports that he does not currently use alcohol. He reports that he does not use drugs.   Family History:  The patient's family history includes Cancer in his maternal grandmother; Heart attack in his maternal grandfather, paternal grandfather, and paternal grandmother; Heart failure (age of onset: 67) in his father; Parkinson's disease (age of onset: 7) in his mother.  ROS:  Please see the history of present illness.    All other systems are reviewed and otherwise negative.   PHYSICAL EXAM:  VS:  There were no vitals taken for this visit. BMI: There is no height or weight on file to calculate BMI. Well nourished, well developed, in no acute distress HEENT: normocephalic, atraumatic Neck: no JVD, carotid bruits or masses Cardiac: RRR; no significant murmurs, no rubs, or gallops Lungs: CTA b/l, no wheezing, rhonchi or rales Abd: soft, nontender MS: no deformity or atrophy Ext: no edema Skin: warm and dry, no rash Neuro:  No gross deficits appreciated Psych: euthymic mood, full affect  ILR site is stable, no tethering or discomfort   EKG:  not done today  Device interrogation done today and reviewed by myself:  Battery is good No AFib (March to now)  10/08/22: EPS/ablation  CONCLUSIONS: 1. Atrial fibrillation upon presentation.   2. Successful electrical isolation and anatomical encircling of all four pulmonary veins with radiofrequency current.  A  WACA approach was used 3. Additional left atrial ablation was performed with a standard box lesion created along the posterior wall of the left atrium 4. Atrial fibrillation successfully cardioverted to sinus rhythm. 5. No early apparent complications.  11/08/21: TTE 1. Left ventricular ejection fraction, by estimation, is 50 to 55%. The  left ventricle has normal  function. The left ventricle has no regional  wall motion abnormalities. Left ventricular diastolic function could not  be evaluated.   2. Right ventricular systolic function is normal. The right ventricular  size is normal. There is normal pulmonary artery systolic pressure.   3. Left atrial size was severely dilated.   4. Right atrial size was severely dilated.   5. The mitral valve has been repaired/replaced. No evidence of mitral  valve regurgitation. No evidence of mitral stenosis. Procedure Date: 2002.   6. The aortic valve is tricuspid. Aortic valve regurgitation is not  visualized. Aortic valve sclerosis is present, with no evidence of aortic  valve stenosis.   7. There is moderate dilatation of the aortic root, measuring 40 mm.  There is moderate dilatation of the ascending aorta, measuring 44 mm.   8. The inferior vena cava is normal in size with greater than 50%  respiratory variability, suggesting right atrial pressure of 3 mmHg.    Recent Labs: 03/23/2022: TSH 0.673 04/05/2022: Magnesium 2.2 04/10/2022: B Natriuretic Peptide 120.5 10/10/2022: ALT 17; BUN 34; Creatinine, Ser 1.48; Hemoglobin 13.0; Platelets 179; Potassium 4.7; Sodium 139  10/29/2022: Chol/HDL Ratio 2.2; Cholesterol, Total 160; HDL 73; LDL Chol Calc (NIH) 75; Triglycerides 60   CrCl cannot be calculated (Patient's most recent lab result is older than the maximum 21 days allowed.).   Wt Readings from Last 3 Encounters:  01/20/23 216 lb 9.6 oz (98.2 kg)  01/01/23 214 lb (97.1 kg)  11/05/22 216 lb 3.2 oz (98.1 kg)     Other studies  reviewed: Additional studies/records reviewed today include: summarized above  ASSESSMENT AND PLAN:  Paroxysmal Afib Atypical Aflutter CHA2DS2Vasc is 3, on Eliquis, appropriately dosed Secondary hypercoagulable state zero % burden  Give his risk score, prior MV surgery, was no inclined to stop his OAC We discussed this today, he is agreeable to continue Would see him back with Sean Stanley next and they can re-discuss given his has aloop and reliable way to monitor his burden  VHD Prior MV repair Functioning well on his last echo C/w gen cardiology team  Vascular aneurysms Follows with vascular team  Given this history particularly I have advised he continue his low dose BB Also agreeable to this  NICM Recovered LVEF No symptoms or exam findings to suggest volume OL C/w general cardiology team   Disposition: back in 4 mo, sooner if needed    Current medicines are reviewed at length with the patient today.  The patient did not have any concerns regarding medicines.  Norma Fredrickson, PA-C 01/31/2023 7:13 AM     [facility] [address] [place] [address] [phone] (office)  [phone] (fax)

## 2023-02-04 ENCOUNTER — Other Ambulatory Visit (HOSPITAL_COMMUNITY): Payer: Self-pay | Admitting: Nurse Practitioner

## 2023-02-04 DIAGNOSIS — I7121 Aneurysm of the ascending aorta, without rupture: Secondary | ICD-10-CM

## 2023-02-04 DIAGNOSIS — I728 Aneurysm of other specified arteries: Secondary | ICD-10-CM

## 2023-02-05 ENCOUNTER — Encounter: Payer: Self-pay | Admitting: Cardiovascular Disease

## 2023-02-05 DIAGNOSIS — Z789 Other specified health status: Secondary | ICD-10-CM

## 2023-02-05 DIAGNOSIS — E785 Hyperlipidemia, unspecified: Secondary | ICD-10-CM

## 2023-02-05 NOTE — Telephone Encounter (Signed)
Will need to be addressed by the general cardiologist. Forwarding to Dr. Elease Hashimoto team to address. Pt aware

## 2023-02-06 NOTE — Progress Notes (Signed)
[facility] Summary Report / Loop Recorder 

## 2023-02-17 DIAGNOSIS — M25561 Pain in right knee: Secondary | ICD-10-CM | POA: Diagnosis not present

## 2023-02-23 LAB — CUP PACEART REMOTE DEVICE CHECK
Date Time Interrogation Session: 20241129230534
Implantable Pulse Generator Implant Date: 20230105

## 2023-02-24 ENCOUNTER — Ambulatory Visit: Payer: Medicare Other

## 2023-02-24 DIAGNOSIS — I480 Paroxysmal atrial fibrillation: Secondary | ICD-10-CM

## 2023-02-27 ENCOUNTER — Telehealth: Payer: Self-pay | Admitting: Cardiovascular Disease

## 2023-02-27 NOTE — Telephone Encounter (Signed)
ILR report reviewed from most recent data 02/26/23 and no alerts on device since January 2024.

## 2023-02-27 NOTE — Telephone Encounter (Signed)
Pt is requesting provider switch from Dr. Elease Hashimoto to Dr. [facility] Salvia since Dr. Elease Hashimoto will be retiring soon and they would like to be seen at the [facility] location.

## 2023-03-04 ENCOUNTER — Encounter
Payer: Medicare Other | Attending: Physical Medicine and Rehabilitation | Admitting: Physical Medicine and Rehabilitation

## 2023-03-04 ENCOUNTER — Encounter: Payer: Self-pay | Admitting: Physical Medicine and Rehabilitation

## 2023-03-04 VITALS — BP 123/74 | HR 61 | Ht 73.0 in | Wt 215.0 lb

## 2023-03-04 DIAGNOSIS — G608 Other hereditary and idiopathic neuropathies: Secondary | ICD-10-CM | POA: Insufficient documentation

## 2023-03-04 DIAGNOSIS — Z79899 Other long term (current) drug therapy: Secondary | ICD-10-CM | POA: Diagnosis not present

## 2023-03-04 DIAGNOSIS — G894 Chronic pain syndrome: Secondary | ICD-10-CM | POA: Insufficient documentation

## 2023-03-04 DIAGNOSIS — J3800 Paralysis of vocal cords and larynx, unspecified: Secondary | ICD-10-CM | POA: Diagnosis not present

## 2023-03-04 NOTE — Patient Instructions (Addendum)
Qutenza  Flomax  Insomnia: -Try to go outside near sunrise -Get exercise during the day.  -Turn off all devices an hour before bedtime.  -Teas that can benefit: chamomile, valerian root, Brahmi (Bacopa) -Can consider over the counter melatonin, magnesium, and/or L-theanine. Melatonin is an anti-oxidant with multiple health benefits. Magnesium is involved in greater than 300 enzymatic reactions in the body and most of [place] are deficient as our soil is often depleted. There are 7 different types of magnesium- Bioptemizer's is a supplement with all 7 types, and each has unique benefits. Magnesium can also help with constipation and anxiety.  -Pistachios naturally increase the production of melatonin -Cozy Earth bamboo bed sheets are free from toxic chemicals.  -Tart cherry juice or a tart cherry supplement can improve sleep and soreness post-workout   -Discussed current symptoms of pain and history of pain.  -Discussed benefits of exercise in reducing pain. -Discussed following foods that may reduce pain: 1) Ginger (especially studied for arthritis)- reduce leukotriene production to decrease inflammation 2) Blueberries- high in phytonutrients that decrease inflammation 3) Salmon- marine omega-3s reduce joint swelling and pain 4) Pumpkin seeds- reduce inflammation 5) dark chocolate- reduces inflammation 6) turmeric- reduces inflammation 7) tart cherries - reduce pain and stiffness 8) extra virgin olive oil - its compound olecanthal helps to block prostaglandins  9) chili peppers- can be eaten or applied topically via capsaicin 10) mint- helpful for headache, muscle aches, joint pain, and itching 11) garlic- reduces inflammation  Link to further information on diet for chronic pain: [email or web address]

## 2023-03-04 NOTE — Progress Notes (Signed)
Subjective:    Patient ID: Sean Stanley, male    DOB: [birth year], 82 y.o.   MRN: [id number]  HPI Sean Stanley is an 82 year old man who presents to establish care for chronic pain.  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 General activity 0 Relation with others 0 Enjoyment of life 0 What TIME of day is your pain at its worst? daytime and night Sleep (in general) Poor  Pain is worse with: walking Pain improves with: medication Relief from Meds: 5  how many minutes can you walk? Walking cause pain in both feet ability to climb steps?  yes do you drive?  yes Do you have any goals in this area?  yes  retired Do you have any goals in this area?  yes  No problems in this area  Any changes since last visit?  no  Any changes since last visit?  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  Tobacco Use   Smoking status: Never   Smokeless tobacco: Never   Tobacco comments:    Never smoke 04/10/22  Vaping  Use   Vaping status: Never Used  Substance and Sexual Activity   Alcohol use: Not Currently    Comment: social   Drug use: No   Sexual activity: Not on file  Other Topics Concern   Not on file  Social History Narrative   Lives in [place], Retired   Chief Executive Officer Determinants of Health   Financial Resource Strain: Not on file  Food Insecurity: Not on file  Transportation Needs: Not on file  Physical Activity: Not on file  Stress: Not on file  Social Connections: Not on file   Past Surgical History:  Procedure Laterality Date   ABDOMINAL ANGIOGRAM  08/05/2012   aneurysm repair     ATRIAL FIBRILLATION ABLATION N/A 10/08/2022   Procedure: ATRIAL FIBRILLATION ABLATION;  Surgeon: Regan Lemming, MD;  Location: [facility] INVASIVE CV LAB;  Service: Cardiovascular;  Laterality: N/A;   CARDIOVERSION  02/12/2012   Procedure: CARDIOVERSION;  Surgeon: [name], MD;  Location: [facility] ENDOSCOPY;  Service: Cardiovascular;  Laterality: N/A;   CARDIOVERSION N/A 06/22/2013   Procedure: CARDIOVERSION;  Surgeon: Lars Masson, MD;  Location: [facility] ENDOSCOPY;  Service: Cardiovascular;  Laterality: N/A;   CARDIOVERSION N/A 06/19/2021   Procedure: CARDIOVERSION;  Surgeon: Christell Constant, MD;  Location: [facility] ENDOSCOPY;  Service: Cardiovascular;  Laterality:  N/A;   CATARACT EXTRACTION Bilateral 2018   with lens implant   CHOLECYSTECTOMY     COLONOSCOPY     ESOPHAGOGASTRODUODENOSCOPY     implantable loop recorder placement  03/29/2021   Medtronic Reveal Linq model LNQ [id number] G) implantable loop recorder   IR IMAGING GUIDED PORT INSERTION  05/26/2018   IR REMOVAL TUN ACCESS W/ PORT W/O FL MOD SED  02/05/2021   LYMPH NODE BIOPSY Left 04/27/2018   Procedure: EXCISIONAL BIOPSY OF LEFT CERVICAL LYMPH NODE;  Surgeon: Christia Reading, MD;  Location: [facility] OR;  Service: ENT;  Laterality: Left;   LYMPH NODE BIOPSY Left 11/23/2018   Procedure: LEFT CERVICAL LYMPH NODE OPEN BIOPSY;  Surgeon: Christia Reading, MD;  Location: [facility] OR;  Service: ENT;  Laterality: Left;   MENISCUS REPAIR Right 2009   MITRAL VALVE REPAIR  2002   [facility]   NM MYOVIEW LTD  07/22/2006   no ischemia   RADICAL NECK DISSECTION Left 01/15/2019   Procedure: LEFT NECK DISSECTION;  Surgeon: Christia Reading, MD;  Location: [facility] OR;  Service: ENT;  Laterality: Left;   RIGHT HEART CATH  06/19/2004   normal right heart dynamics. EF 50%   TEE WITHOUT CARDIOVERSION  02/12/2012   Procedure: TRANSESOPHAGEAL ECHOCARDIOGRAM (TEE);  Surgeon: [name], MD;  Location: [facility] ENDOSCOPY;  Service: Cardiovascular;  Laterality: N/A;   TEE WITHOUT CARDIOVERSION N/A 06/22/2013   Procedure: TRANSESOPHAGEAL ECHOCARDIOGRAM (TEE);  Surgeon: Lars Masson, MD;  Location: [facility] ENDOSCOPY;  Service: Cardiovascular;  Laterality: N/A;   THYROIDECTOMY N/A 01/15/2019   Procedure: TOTAL THYROIDECTOMY;  Surgeon: Christia Reading, MD;  Location: [facility] OR;  Service: ENT;  Laterality: N/A;   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 [facility])    being followed at [facility]   Aneurysm of splenic artery (HCC) 2014   s/p coiling 12/16/12 - [facility]   Atrial flutter (HCC)    BPH (benign prostatic hyperplasia)    Cancer (HCC)    melanoma on lower right back and left chest - surgically removed and cleared   Difficult intubation    Dysrhythmia    H/O agent Orange exposure    Headache    migraine- not current   High bilirubin    pt states it's genetic   History of blood transfusion    Hypercholesteremia    Hypercholesterolemia    Lymphoma (HCC) 04/2018   Mitral valve disease    annuloplasty 2002 [facility]   Neuropathy    Neuropathy of both  feet    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 Earl Gala told him he had improved   Paroxysmal atrial fibrillation (HCC) 01/02/2021   Pneumonia    Thoracic ascending aortic aneurysm (HCC)    4.5 cm 03/2018 CT. 4.4 cm on echo 10/2021 and on CTA 03/2021   Thyroid cancer (HCC)    Ht 6\' 1"  (1.854 m)   Wt 215 lb (97.5 kg)   BMI 28.37 kg/m   Opioid Risk Score:   Fall Risk Score:  `1  Depression screen [facility] 2/9     08/15/2022  3:35 PM 05/31/2022    9:22 AM  Depression screen PHQ 2/9  Decreased Interest 0 0  Down, Depressed, Hopeless 0 0  PHQ - 2 Score 0 0  Altered sleeping 3 0  Tired, decreased energy 1 0  Change in appetite 0 0  Feeling bad or failure about yourself  0 0  Trouble concentrating 0 0  Moving slowly or fidgety/restless 0 0  Suicidal thoughts 0 0  PHQ-9 Score 4 0  Difficult doing work/chores Not difficult at all Not difficult at all    Review of Systems  Respiratory:  Positive for shortness of breath.   Cardiovascular:  Positive for leg swelling.       Right knee &  ankle swelling  Musculoskeletal:        Left side neck from ear to collar bone, right knee pain, pain in both feet  Hematological:  Bruises/bleeds easily.  All other systems reviewed and are negative.      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  -Discussed benefits of exercise in reducing pain. -Discussed following foods that may reduce pain: 1) Ginger (especially studied for arthritis)- reduce leukotriene production to decrease inflammation 2)  Blueberries- high in phytonutrients that decrease inflammation 3) Salmon- marine omega-3s reduce joint swelling and pain 4) Pumpkin seeds- reduce inflammation 5) dark chocolate- reduces inflammation 6) turmeric- reduces inflammation 7) tart cherries - reduce pain and stiffness 8) extra virgin olive oil - its compound olecanthal helps to block prostaglandins  9) chili peppers- can be eaten or applied topically via capsaicin 10) mint- helpful for headache, muscle aches, joint pain, and itching 11) garlic- reduces inflammation  Link to further information on diet for chronic pain: http://www.bray.com/   2) Paralyzed vocal cord: -discussed that he has been through special therapy  3) Peripheral neuropathy:  -Discussed Qutenza as an option for neuropathic pain control. Discussed that this is a capsaicin patch, stronger than capsaicin cream. Discussed that it is currently approved for diabetic peripheral neuropathy and post-herpetic neuralgia, but that it has also shown benefit in treating other forms of neuropathy. Provided patient with link to site to learn more about the patch: https://www.clark.biz/. Discussed that the patch would be placed in office and benefits usually last 3 months. Discussed that unintended exposure to capsaicin can cause severe irritation of eyes, mucous membranes, respiratory tract, and skin, but that Qutenza is a local treatment and does not have the systemic side effects of other nerve medications. Discussed that there may be pain, itching, erythema, and decreased sensory function associated with the application of Qutenza. Side effects usually subside within 1 week. A cold pack of analgesic medications can help with these side effects. Blood pressure can also be increased due to pain associated with administration of the patch.   Prescribing Home Zynex NexWave Stimulator Device and supplies as needed.  IFC, NMES and TENS medically necessary Treatment Rx: Daily @ 30-40 minutes per treatment PRN. Zynex NexWave only, no substitutions. Treatment Goals: 1) To reduce and/or eliminate pain 2) To improve functional capacity and Activities of daily living 3) To reduce or prevent the need for oral  medications 4) To improve circulation in the injured region 5) To decrease or prevent muscle spasm and muscle atrophy 6) To provide a self-management tool to the patient The patient has not sufficiently improved with conservative care. Numerous studies indexed by Medline and PubMed.gov have shown Neuromuscular, Interferential, and TENS stimulators to reduce pain, improve function, and reduce medication use in injured patients. Continued use of this evidence based, safe, drug free treatment is both reasonable and medically necessary at this time.   4) polypharmacy:  -discussed that he is not interested in medication

## 2023-03-17 ENCOUNTER — Encounter: Payer: Self-pay | Admitting: Student

## 2023-03-17 ENCOUNTER — Ambulatory Visit: Payer: Medicare Other | Attending: Internal Medicine | Admitting: Student

## 2023-03-17 VITALS — BP 152/84 | HR 60

## 2023-03-17 DIAGNOSIS — E7849 Other hyperlipidemia: Secondary | ICD-10-CM | POA: Diagnosis not present

## 2023-03-17 NOTE — Assessment & Plan Note (Signed)
Assessment/Plan:  LDL goal: < 70 mg/dl last LDLc 56 mg/dl lab done at [facility](12/9602) Tolerates Zetia and moderate intensity statins three times per week Exercises regularly and follows healthy well balanced diet  Given at goal LDLc not making any changes to the current therapy  Continue taking Crestor 10 mg 3 time per week and ezetimibe 10 mg daily

## 2023-03-17 NOTE — Patient Instructions (Signed)
 No Changes made by your pharmacist Carmela Hurt, PharmD at today's visit:     Bring all of your meds, your BP cuff and your record of home blood pressures to your next appointment.    HOW TO TAKE YOUR BLOOD PRESSURE AT HOME  Rest 5 minutes before taking your blood pressure.  Don't smoke or drink caffeinated beverages for at least 30 minutes before. Take your blood pressure before (not after) you eat. Sit comfortably with your back supported and both feet on the floor (don't cross your legs). Elevate your arm to heart level on a table or a desk. Use the proper sized cuff. It should fit smoothly and snugly around your bare upper arm. There should be enough room to slip a fingertip under the cuff. The bottom edge of the cuff should be 1 inch above the crease of the elbow. Ideally, take 3 measurements at one sitting and record the average.  Important lifestyle changes to control high blood pressure  Intervention  Effect on the BP  Lose extra pounds and watch your waistline Weight loss is one of the most effective lifestyle changes for controlling blood pressure. If you're overweight or obese, losing even a small amount of weight can help reduce blood pressure. Blood pressure might go down by about 1 millimeter of mercury (mm Hg) with each kilogram (about 2.2 pounds) of weight lost.  Exercise regularly As a general goal, aim for at least 30 minutes of moderate physical activity every day. Regular physical activity can lower high blood pressure by about 5 to 8 mm Hg.  Eat a healthy diet Eating a diet rich in whole grains, fruits, vegetables, and low-fat dairy products and low in saturated fat and cholesterol. A healthy diet can lower high blood pressure by up to 11 mm Hg.  Reduce salt (sodium) in your diet Even a small reduction of sodium in the diet can improve heart health and reduce high blood pressure by about 5 to 6 mm Hg.  Limit alcohol One drink equals 12 ounces of beer, 5 ounces of  wine, or 1.5 ounces of 80-proof liquor.  Limiting alcohol to less than one drink a day for women or two drinks a day for men can help lower blood pressure by about 4 mm Hg.   If you have any questions or concerns please use My Chart to send questions or call the office at [phone]

## 2023-03-17 NOTE — Progress Notes (Unsigned)
Patient ID: [name]                 DOB: [birth year]                    MRN: [id number]      HPI: Sean Stanley is a 82 y.o. male patient referred to lipid clinic by Dr.Nahser. 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 Both cancers are in remission   Patient is here for lipid clinic, he brought in his recent  lipid lab from [place] which I shows LDLc 56 mg/dl and TG 914 mg/dl , TC 782 mg/dl (95/62/1308). He has been taking Crestor 10 mg 3 times per week and he only can tolerate that dose along with Zetia 10 mg daily. He lives at [employer] home eats healthy low salt low fat diet. He enjoys doing water aerobics 3-4 time per week His BP was elevated at last visit with Dr.Nahser. he checks at home and it stays between 140-160 range he does not recall heart rate or DBP.  We reviewed BP goal and potential BP lowering medications he does not want to add any medication at this point he would like to keep eye on it and does not mind following up in 5-6 weeks.  He has trouble staying to sleep due to his frequent bathroom visits at night  and nightmares from amiodarone. He has stopped taking amiodarone and he will be going to urologist for BPH follow up.   Current Medications: Crestor 10 mg 3 time per week and ezetimibe 10 mg daily  Intolerances: Crestor 10 mg daily- muscle aches and pain  Risk Factors: Ca score of 602 on most recent CT scan, CAD  LDL goal: <70 mg/dl   Diet:  Eats fish twice a week and generally healthy food. Low sat, low fat food  Snacks: generally none but lately butter cookies, italian cake  Exercise: water  aerobics   Family History:  Relation Problem Comments  Mother (Deceased) Parkinson's disease (Age: 90)     Father (Deceased) Heart failure (Age: 57)     Maternal Grandmother (Deceased at age 28) Cancer     Maternal Grandfather (Deceased at age 77) Heart attack     Paternal Grandmother (Deceased at age 42) Heart attack     Paternal Grandfather (Deceased at age 39)      Social History:  Alcohol: none  Smoking: none   Labs: Lipid Panel     Component Value Date/Time   CHOL 160 10/29/2022 0734   TRIG 60 10/29/2022 0734   HDL 73 10/29/2022 0734   CHOLHDL 2.2 10/29/2022 0734   CHOLHDL 3.2 02/22/2019 0757   VLDL 23 02/22/2019 0757   LDLCALC 75 10/29/2022 0734   LABVLDL 12 10/29/2022 0734    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 [facility])  being followed at [facility]   Aneurysm of splenic artery [facility]) 2014   s/p coiling 12/16/12 - [facility]   Atrial flutter (HCC)    BPH (benign prostatic hyperplasia)    Cancer (HCC)    melanoma on lower right back and left chest - surgically removed and cleared   Difficult intubation    Dysrhythmia    H/O agent Orange exposure    Headache    migraine- not current   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 Earl Gala told him he had improved   Paroxysmal atrial fibrillation (HCC) 01/02/2021   Pneumonia    Thoracic  ascending aortic aneurysm (HCC)    4.5 cm 03/2018 CT. 4.4 cm on echo 10/2021 and on CTA 03/2021   Thyroid cancer [facility])     Current Outpatient Medications on File Prior to Visit  Medication Sig Dispense Refill   acetaminophen (TYLENOL) 500 MG tablet Take 1,000 mg by mouth daily as needed for moderate pain.     alfuzosin (UROXATRAL) 10 MG 24 hr tablet Take 10 mg by mouth daily. Alternate with Finasteride the evening     apixaban (ELIQUIS) 5 MG TABS tablet Take 1 tablet (5 mg total) by mouth 2 (two) times daily. 60 tablet 3   betamethasone dipropionate 0.05 % cream Apply 1 application  topically daily as needed (mosquito bites).     buPROPion (WELLBUTRIN XL) 150 MG 24 hr tablet Take 1 tablet (150 mg total) by mouth daily. 90 tablet 3   Cholecalciferol (VITAMIN D) 2000 units tablet Take 2,000 Units by mouth at bedtime.      COVID-19 mRNA vaccine 2023-2024 (COMIRNATY) syringe Inject into the muscle. 0.3 mL 0   ezetimibe (ZETIA) 10 MG tablet Take 1 tablet (10 mg total) by mouth every morning. 90 tablet 2   finasteride (PROSCAR) 5 MG tablet Take 5 mg by mouth every other day. Alternate with Alfuzosin in the evening     levothyroxine (SYNTHROID) 175 MCG tablet Take 1 tablet (175 mcg total) by mouth daily.     lidocaine (LMX) 4 % cream Apply 1 Application topically daily as needed (peripheral neuropathy in feet).     metoprolol succinate (TOPROL-XL) 25 MG 24 hr tablet Take 12.5 mg by mouth daily.     Polyethyl Glycol-Propyl Glycol (SYSTANE HYDRATION PF OP) Place 1 drop into both eyes 3 (three) times daily as needed (Dry eyes).     rosuvastatin (CRESTOR) 5 MG tablet Take 2 tablets by mouth Monday, wed, and fri     No current facility-administered medications on file prior to visit.    Allergies  Allergen Reactions   Levaquin [Levofloxacin In D5w]     tendonitis   Nickel Itching   Pravastatin     Muscle aches   Simvastatin     Chest pain   Allopurinol Rash   Ampicillin Rash    Did it involve  swelling of the face/tongue/throat, SOB, or low BP? No Did it involve sudden or severe rash/hives, skin peeling, or any reaction on the inside of your mouth or nose? Yes Did you need to seek medical  attention at a hospital or doctor's office? Yes When did it last happen?      50+ years If all above answers are "NO", may proceed with cephalosporin use.    Penicillin G Rash    Assessment/Plan:  1. Hyperlipidemia -  Problem  Hyperlipidemia   Hyperlipidemia Assessment/Plan:  LDL goal: < 70 mg/dl last LDLc 56 mg/dl lab done at [facility](98/1191) Tolerates Zetia and moderate intensity statins three times per week Exercises regularly and follows healthy well balanced diet  Given at goal LDLc not making any changes to the current therapy  Continue taking Crestor 10 mg 3 time per week and ezetimibe 10 mg daily    Thank you,  Carmela Hurt, Pharm.D [facility] [address], [place], [place] [address]  Phone: [phone]; Fax: [phone]

## 2023-03-28 DIAGNOSIS — F418 Other specified anxiety disorders: Secondary | ICD-10-CM | POA: Diagnosis not present

## 2023-03-28 DIAGNOSIS — I4891 Unspecified atrial fibrillation: Secondary | ICD-10-CM | POA: Diagnosis not present

## 2023-03-28 DIAGNOSIS — E890 Postprocedural hypothyroidism: Secondary | ICD-10-CM | POA: Diagnosis not present

## 2023-03-28 DIAGNOSIS — C73 Malignant neoplasm of thyroid gland: Secondary | ICD-10-CM | POA: Diagnosis not present

## 2023-03-31 ENCOUNTER — Telehealth: Payer: Self-pay

## 2023-03-31 ENCOUNTER — Ambulatory Visit (INDEPENDENT_AMBULATORY_CARE_PROVIDER_SITE_OTHER): Payer: Medicare Other

## 2023-03-31 DIAGNOSIS — I480 Paroxysmal atrial fibrillation: Secondary | ICD-10-CM

## 2023-03-31 LAB — CUP PACEART REMOTE DEVICE CHECK
Date Time Interrogation Session: 20250105230946
Implantable Pulse Generator Implant Date: 20230105

## 2023-03-31 NOTE — Telephone Encounter (Signed)
 Wife called and left a message. Sean Stanley started a couple days ago having swelling in his neck.  Called Sean Stanley back. A few days ago he started having left sided neck swelling and his left facial cheek is having some discomfort. He is asking for a earlier appt. He is scheduled on 1/17 for lab and Dr. Lonn.

## 2023-03-31 NOTE — Telephone Encounter (Signed)
 Called and scheduled appt on 1/13. Wife and Jesusita Oka are aware of appts.

## 2023-03-31 NOTE — Telephone Encounter (Signed)
 Next Monday 1/13 there are several 20 mins appt, please schedule lab also

## 2023-04-02 DIAGNOSIS — R351 Nocturia: Secondary | ICD-10-CM | POA: Diagnosis not present

## 2023-04-02 DIAGNOSIS — N401 Enlarged prostate with lower urinary tract symptoms: Secondary | ICD-10-CM | POA: Diagnosis not present

## 2023-04-07 ENCOUNTER — Inpatient Hospital Stay (HOSPITAL_BASED_OUTPATIENT_CLINIC_OR_DEPARTMENT_OTHER): Payer: Medicare Other | Admitting: Hematology and Oncology

## 2023-04-07 ENCOUNTER — Inpatient Hospital Stay: Payer: Medicare Other | Attending: Hematology and Oncology

## 2023-04-07 ENCOUNTER — Encounter: Payer: Self-pay | Admitting: Hematology and Oncology

## 2023-04-07 VITALS — BP 120/72 | HR 60 | Temp 97.6°F | Resp 18 | Ht 73.0 in | Wt 218.6 lb

## 2023-04-07 DIAGNOSIS — Z85850 Personal history of malignant neoplasm of thyroid: Secondary | ICD-10-CM | POA: Diagnosis not present

## 2023-04-07 DIAGNOSIS — H0013 Chalazion right eye, unspecified eyelid: Secondary | ICD-10-CM | POA: Diagnosis not present

## 2023-04-07 DIAGNOSIS — H04123 Dry eye syndrome of bilateral lacrimal glands: Secondary | ICD-10-CM | POA: Diagnosis not present

## 2023-04-07 DIAGNOSIS — D61818 Other pancytopenia: Secondary | ICD-10-CM

## 2023-04-07 DIAGNOSIS — C73 Malignant neoplasm of thyroid gland: Secondary | ICD-10-CM

## 2023-04-07 DIAGNOSIS — Z8572 Personal history of non-Hodgkin lymphomas: Secondary | ICD-10-CM | POA: Insufficient documentation

## 2023-04-07 DIAGNOSIS — Z85820 Personal history of malignant melanoma of skin: Secondary | ICD-10-CM | POA: Diagnosis present

## 2023-04-07 DIAGNOSIS — C8318 Mantle cell lymphoma, lymph nodes of multiple sites: Secondary | ICD-10-CM

## 2023-04-07 DIAGNOSIS — Z961 Presence of intraocular lens: Secondary | ICD-10-CM | POA: Diagnosis not present

## 2023-04-07 DIAGNOSIS — H40013 Open angle with borderline findings, low risk, bilateral: Secondary | ICD-10-CM | POA: Diagnosis not present

## 2023-04-07 LAB — CBC WITH DIFFERENTIAL/PLATELET
Abs Immature Granulocytes: 0.01 10*3/uL (ref 0.00–0.07)
Basophils Absolute: 0.1 10*3/uL (ref 0.0–0.1)
Basophils Relative: 1 %
Eosinophils Absolute: 0.1 10*3/uL (ref 0.0–0.5)
Eosinophils Relative: 2 %
HCT: 38.8 % — ABNORMAL LOW (ref 39.0–52.0)
Hemoglobin: 12.9 g/dL — ABNORMAL LOW (ref 13.0–17.0)
Immature Granulocytes: 0 %
Lymphocytes Relative: 22 %
Lymphs Abs: 1 10*3/uL (ref 0.7–4.0)
MCH: 30.4 pg (ref 26.0–34.0)
MCHC: 33.2 g/dL (ref 30.0–36.0)
MCV: 91.3 fL (ref 80.0–100.0)
Monocytes Absolute: 0.5 10*3/uL (ref 0.1–1.0)
Monocytes Relative: 12 %
Neutro Abs: 2.9 10*3/uL (ref 1.7–7.7)
Neutrophils Relative %: 63 %
Platelets: 145 10*3/uL — ABNORMAL LOW (ref 150–400)
RBC: 4.25 MIL/uL (ref 4.22–5.81)
RDW: 13.7 % (ref 11.5–15.5)
WBC: 4.7 10*3/uL (ref 4.0–10.5)
nRBC: 0 % (ref 0.0–0.2)

## 2023-04-07 LAB — COMPREHENSIVE METABOLIC PANEL
ALT: 18 U/L (ref 0–44)
AST: 18 U/L (ref 15–41)
Albumin: 4.1 g/dL (ref 3.5–5.0)
Alkaline Phosphatase: 47 U/L (ref 38–126)
Anion gap: 5 (ref 5–15)
BUN: 27 mg/dL — ABNORMAL HIGH (ref 8–23)
CO2: 28 mmol/L (ref 22–32)
Calcium: 9 mg/dL (ref 8.9–10.3)
Chloride: 106 mmol/L (ref 98–111)
Creatinine, Ser: 1.28 mg/dL — ABNORMAL HIGH (ref 0.61–1.24)
GFR, Estimated: 56 mL/min — ABNORMAL LOW (ref >60)
Glucose, Bld: 94 mg/dL (ref 70–99)
Potassium: 4.2 mmol/L (ref 3.5–5.1)
Sodium: 139 mmol/L (ref 135–145)
Total Bilirubin: 1.3 mg/dL — ABNORMAL HIGH (ref 0.0–1.2)
Total Protein: 6.1 g/dL — ABNORMAL LOW (ref 6.5–8.1)

## 2023-04-07 NOTE — Assessment & Plan Note (Signed)
 He has palpable fullness in the left supraclavicular region and a palpable small cervical lymph node on exam I reviewed his prior CT imaging from October which show no evidence of disease in those location Recommend repeat CT imaging for further evaluation

## 2023-04-07 NOTE — Assessment & Plan Note (Signed)
 He has mild anemia chronic kidney disease He had mild thrombocytopenia which is stable He is not symptomatic Observe only

## 2023-04-07 NOTE — Assessment & Plan Note (Signed)
 He had history of thyroid cancer As above, I plan to order imaging study

## 2023-04-07 NOTE — Progress Notes (Signed)
 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 in the left supraclavicular region and a palpable small cervical lymph node on exam I reviewed his prior CT imaging from October which show no evidence of disease in those location Recommend repeat CT imaging for further evaluation  Thyroid  cancer [facility]) He had history of thyroid  cancer As above, I plan to order imaging study  Pancytopenia, acquired [facility]) He has mild anemia chronic kidney disease He had mild thrombocytopenia which is stable He is not symptomatic Observe only  Orders Placed This Encounter  Procedures   CT Soft Tissue Neck W Contrast    Standing Status:   Future    Expected Date:   04/11/2023    Expiration Date:   04/06/2024    If indicated for the ordered procedure, I authorize the administration of contrast media per Radiology protocol:   Yes    Does the patient have a contrast media/X-ray dye allergy?:   No    Preferred imaging location?:   [facility]    All questions were answered. The patient knows to call the clinic with any problems, questions or concerns. The total time spent in the appointment was 30 minutes encounter with patients including review of chart and various tests results, discussions about plan of care and coordination of care plan   Almarie Bedford, MD 04/07/2023 9:26 AM  INTERVAL HISTORY: Please see below for problem oriented charting. he returns for surveillance follow-up A week ago, the patient felt some tightness on the left side of his neck He felt some fullness at the left supraclavicular region and possibly a small lymph node on the left side of his neck He denies recent upper  respiratory tract infection no dental issues  REVIEW OF SYSTEMS:   Constitutional: Denies fevers, chills or abnormal weight loss Eyes: Denies blurriness of vision Ears, nose, mouth, throat, and face: Denies mucositis or sore throat Respiratory: Denies cough, dyspnea or wheezes Cardiovascular: Denies palpitation, chest discomfort or lower extremity swelling Gastrointestinal:  Denies nausea, heartburn or change in bowel habits Skin: Denies abnormal skin rashes Neurological:Denies numbness, tingling or new weaknesses Behavioral/Psych: Mood is stable, no new changes  All other systems were reviewed with the patient and are negative.  I have reviewed the past medical history, past surgical history, social history and family history with the patient and they are unchanged from previous note.  ALLERGIES:  is allergic to levaquin [levofloxacin in d5w], nickel, pravastatin , simvastatin, allopurinol , ampicillin, and penicillin g.  MEDICATIONS:  Current Outpatient Medications  Medication Sig Dispense Refill   acetaminophen  (TYLENOL ) 500 MG tablet Take 1,000 mg by mouth daily as needed for moderate pain.     alfuzosin  (UROXATRAL ) 10 MG 24 hr tablet Take 10 mg by mouth daily. Alternate with Finasteride  the evening     apixaban  (ELIQUIS ) 5 MG TABS tablet Take 1 tablet (5 mg total) by mouth 2 (two) times daily. 60 tablet 3   betamethasone dipropionate 0.05 % cream Apply 1 application  topically daily as needed (mosquito bites).     buPROPion  (WELLBUTRIN  XL) 150 MG 24 hr tablet Take 1 tablet (150 mg total) by mouth daily. 90 tablet 3   Cholecalciferol  (VITAMIN D ) 2000 units tablet Take 2,000 Units by mouth  at bedtime.      COVID-19 mRNA vaccine 2023-2024 (COMIRNATY ) syringe Inject into the muscle. 0.3 mL 0   ezetimibe  (ZETIA ) 10 MG tablet Take 1 tablet (10 mg total) by mouth every morning. 90 tablet 2   finasteride  (PROSCAR ) 5 MG tablet Take 5 mg by mouth every other day. Alternate with Alfuzosin  in the  evening     levothyroxine  (SYNTHROID ) 175 MCG tablet Take 1 tablet (175 mcg total) by mouth daily.     lidocaine  (LMX) 4 % cream Apply 1 Application topically daily as needed (peripheral neuropathy in feet).     metoprolol  succinate (TOPROL -XL) 25 MG 24 hr tablet Take 12.5 mg by mouth daily.     Polyethyl Glycol-Propyl Glycol (SYSTANE HYDRATION PF OP) Place 1 drop into both eyes 3 (three) times daily as needed (Dry eyes).     rosuvastatin  (CRESTOR ) 5 MG tablet Take 2 tablets by mouth Monday, wed, and fri     No current facility-administered medications for this visit.    SUMMARY OF ONCOLOGIC HISTORY: Oncology History Overview Note  MIPI score 7.5 high risk   Mantle cell lymphoma (HCC)  06/13/2017 Imaging   Ct imaging at [facility] 1. 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 2. 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 3. Slight reduction in size of right retroperitoneal evolving hematoma.   03/30/2018 Imaging   Ct neck 1. Extensive adenopathy on the left. The largest node is a level 1/submandibular node measuring 38 x 24 x 22 mm. Largest level 2 node measures 3.2 x 2 x 2 cm. Numerous other smaller but round lymph nodes throughout the left neck in the level 2 through level 4 region. The largest level 5 node measures 3.4 x 3.4 x 2.3 cm with a transverse diameter of 2.3 cm. This contains some internal calcifications. There are numerous other pathologic nodes in the left supraclavicular to axillary region. This pattern of disease could be due to extensive left neck metastatic disease from unknown primary, lymphoma, or other systemic malignancy. 2. No evidence of mucosal or submucosal lesion. 3. Diameter of the ascending aorta is 4.5 cm. Recommend annual imaging followup by CTA or MRA. Aortic Atherosclerosis (ICD10-I70.0).      04/10/2018 Pathology Results   Left zone 1 neck mass, Fine Needle Aspiration I (smears and cell block):      Atypical lymphoid proliferation. See comment.       Specimen Adequacy:  Satisfactory for evaluation.  COMMENT:The aspirate demonstrates abundant small to medium sized lymphocytes with scattered epithelioid cells in the background which may be histiocytes.  The smears are cellular, but the cell block includes scant lymphocytes.  Immunohistochemical stains were attempted showing positive staining for CD20 with rare staining for CD3.  Cytokeratin AE1/AE3 is negative. S100 shows high nonspecific background staining but no specifically diagnostic cellular staining.  Overall, the findings indicate an atypical lymphoid proliferation but are too limited for definitive diagnosis.  Excisional biopsy with flow cytometry is recommended.   04/10/2018 Procedure   He was ENT who performed FNA of neck LN   04/27/2018 Pathology Results   Lymph node for lymphoma, [name], clumped chromatin. By immunohistochemistry the lymphocytes  are B-cells with expression of CD20, [name], [name], [name], [name], [name], the features are consistent with a mantle cell lymphoma   05/13/2018 PET scan   1. Hypermetabolic left cervical lymphadenopathy, left axillary lymphadenopathy, mediastinal / right hilar lymphadenopathy, right external iliac lymphadenopathy, and subcentimeter left  external iliac and left inguinal lymph nodes, [name], [name], [name], without focal lesions, that may represent lymphomatous involvement of the spleen.   05/21/2018 Cancer Staging   Staging form: Hodgkin and Non-Hodgkin Lymphoma, AJCC 8th Edition - Clinical: Stage III - Signed by Lonn Hicks, MD on 05/21/2018   05/26/2018 Procedure   Ultrasound and fluoroscopically guided right internal jugular single lumen power port catheter insertion. Tip in the SVC/RA junction. Catheter ready for use.     06/01/2018 -  Chemotherapy   The patient had Bendamustine  and Rituximab  for chemotherapy treatment   08/24/2018 PET scan   1. Partial response to therapy with complete resolution of size and metabolic activity of the dominant LEFT submandibular node (level 1) and LEFT axillary nodes. 2. Persistent and increased metabolic activity of left level 3 lymph nodes ( Deauville 4). Nodes are partially calcified and similar size. 3. No new metastatic disease. 4. Normal spleen and bone marrow.   11/09/2018 PET scan   IMPRESSION: 1. Mild response to therapy of hypermetabolic left cervical nodes. (Deauville) 4. 2. No new or progressive disease. 3. Coronary artery atherosclerosis. Aortic Atherosclerosis (ICD10-I70.0). 4. Trace cul-de-sac fluid, similar.   12/18/2018 Imaging   1. 4 mm solid nodule in the right lower lobe, which is technically indeterminate. Recommend attention on follow-up per clinical protocol. 2. Small calcified nodule adjacent to the left lobe of the thyroid , which may represent thyroid  nodule or calcified lymph node. Recommend correlation with thyroid  ultrasound. 3. The ascending aorta appears mildly dilated, measuring up to 4.2cm. 4. Decreased left axillary lymphadenopathy.   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   Stranding with postsurgical changes along the right anterior neck anterior to the thyroid  cartilage, favored to be related to recent surgery. Attention on follow-up is suggested.   No suspicious lymphadenopathy in the neck, chest, abdomen, or pelvis. Deauville criteria 1   11/22/2019 Imaging   1. No evidence of recurrent lymphoma in the chest, abdomen or pelvis. Normal size spleen. 2. 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 Recommend annual imaging followup by CTA or MRA. This recommendation follows 2010 [facility]/AHA/AATS/ACR/ASA/SCA/SCAI/SIR/STS/SVM Guidelines for the Diagnosis and Management of Patients with Thoracic Aortic Disease. Circulation. 2010; 121: [id number]. Aortic aneurysm NOS (ICD10-I71.9). Moderate diffuse colonic diverticulosis. Mild prostatomegaly. Aortic Atherosclerosis (ICD10-I70.0).   05/30/2020 Imaging   Stable exam. No evidence of recurrent lymphoma or metastatic disease within the chest, abdomen, or pelvis.   Colonic diverticulosis, without radiographic evidence of diverticulitis.   Tiny nonobstructing left renal calculus.   Stable mildly enlarged prostate.   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   Aortic and coronary atherosclerotic calcification.   02/05/2021 Procedure   Removal of implanted Port-A-Cath utilizing sharp and blunt dissection. The procedure was  uncomplicated.   04/11/2021 Imaging   1. No evidence of mass or lymphadenopathy in the chest, abdomen, or pelvis. 2. Normal spleen. 3. Unchanged enlargement of the tubular ascending thoracic aorta, measuring up to 4.4 x 4.4 cm.  4. Cardiomegaly and coronary artery disease.     Thyroid  cancer (HCC)  11/23/2018 Pathology Results   Lymph node for lymphoma, Left zone 3 - PAPILLARY THYROID  CARCINOMA. - SEE MICROSCOPIC DESCRIPTION. Microscopic Comment The specimen consists mostly of encapsulated papillary thyroid  carcinoma. There are  portions of lymphoid tissue attached to the periphery of the specimen and in the adjacent adipose tissue which suggests that this may represent papillary carcinoma metastatic to a lymph node.   11/23/2018 Surgery   PREOPERATIVE DIAGNOSIS:  Mantle cell lymphoma and cervical lymphadenopathy.   POSTOPERATIVE DIAGNOSIS:  Mantle cell lymphoma and cervical lymphadenopathy.   PROCEDURE:  Excisional biopsy of left cervical lymph nodes.     01/15/2019 Pathology Results   A. SOFT TISSUE, NECK, ADJACENT TO LEFT RECURRENT NERVE, BIOPSY: - Papillary thyroid  carcinoma. B. LYMPH NODES, LEFT NECK, ZONES 2,3,4, EXCISION: - Metastatic papillary thyroid  carcinoma in 5 of 14 lymph nodes (5/14). C. ADDITIONAL LEFT LEVEL 4 TISSUE, EXCISION: - There is no evidence of carcinoma in 2 of 2 lymph nodes (0/2). D. TOTAL THYROIDECTOMY: - Papillary thyroid  carcinoma, 0.8 cm. - Carcinoma is broadly present at an inked tissue edge. - See oncology table below. E. LEFT LEVEL 6 TISSUE, EXCISION: - Benign fibroadipose tissue. - There is no evidence of malignancy. F. LEFT RECURRENT NERVE AND SURROUNDING TISSUE, EXCISION: - Metastatic papillary thyroid  carcinoma in 3 of 3 lymph nodes (3/3).  THYROID  GLAND: Procedure: Thyroidectomy Tumor Focality: Unifocal Tumor Site: Left lobe Tumor Size: 0.8 cm Histologic Type: Papillary thyroid  carcinoma Margins: Carcinoma is broadly present at a black inked tissue edge. 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   PROCEDURE:  Total thyroidectomy and left modified radical neck dissection.   SURGEON:  Vaughan Ricker, MD     FINDINGS:  There was a firm nodule in zone IIB on the left side and also some firm tissue and adherence to the jugular vein in zone III region requiring removal of a portion of the vein.  There was a firm nodule in the left thyroid  lobe that extended posterior to the gland, adhering to the trachea, and encompassing the recurrent laryngeal nerve.  Tissue was sent for frozen section from adjacent to the nerve, demonstrating papillary carcinoma.  The nerve stimulated distal to the mass but not proximal to the mass; thus, the nerve was removed with the mass.  The left-sided parathyroid glands were visualized, as was the right superior parathyroid gland.  The right recurrent nerve was kept safe and stimulated well at the end of the case.   05/30/2020 Imaging   Negative for recurrent mass or adenopathy in the neck   Left vocal cord paresis.   04/03/2021 Cancer Staging   Staging form: Thyroid  - Differentiated, AJCC 8th Edition - Pathologic stage from  04/03/2021: pT1a, pN1, cM0 - Signed by Lonn Hicks, MD on 04/03/2021 Lymph node metastasis: Present   04/11/2021 Imaging   No change from the prior study.  No recurrent mass or adenopathy   Postop changes left neck, stable   Chronic paresis left vocal cord unchanged.     PHYSICAL EXAMINATION: ECOG PERFORMANCE STATUS: 0 - Asymptomatic  Vitals:   04/07/23 0840  BP: 120/72  Pulse: 60  Resp: 18  Temp: 97.6 F (36.4 C)  SpO2: 96%   Filed Weights   04/07/23 0840  Weight: 218 lb 9.6 oz (99.2 kg)    GENERAL:alert, no distress and comfortable SKIN: skin color, texture, turgor are normal, no rashes or significant lesions EYES: normal, Conjunctiva are pink and non-injected, sclera clear OROPHARYNX:no exudate, no  erythema and lips, buccal mucosa, and tongue normal  NECK: Noted well-healed surgical scar.  Palpable left supraclavicular fullness.  Probable small lymph node on the left side of his neck LYMPH:  no palpable lymphadenopathy in the axillary or inguinal LUNGS: clear to auscultation and percussion with normal breathing effort HEART: regular rate & rhythm and no murmurs and no lower extremity edema ABDOMEN:abdomen soft, non-tender and normal bowel sounds Musculoskeletal:no cyanosis of digits and no clubbing  NEURO: alert & oriented x 3 with fluent speech, no focal motor/sensory deficits  LABORATORY DATA:  I have reviewed the data as listed    Component Value Date/Time   NA 139 04/07/2023 0822   NA 140 09/19/2022 0746   K 4.2 04/07/2023 0822   CL 106 04/07/2023 0822   CO2 28 04/07/2023 0822   GLUCOSE 94 04/07/2023 0822   BUN 27 (H) 04/07/2023 0822   BUN 34 (H) 09/19/2022 0746   CREATININE 1.28 (H) 04/07/2023 0822   CREATININE 1.43 (H) 04/11/2022 1000   CALCIUM  9.0 04/07/2023 0822   PROT 6.1 (L) 04/07/2023 0822   PROT 6.0 09/03/2021 0749   ALBUMIN 4.1 04/07/2023 0822   ALBUMIN 4.3 09/03/2021 0749   AST 18 04/07/2023 0822   AST 20 04/11/2022 1000   ALT 18 04/07/2023 0822   ALT 28 04/11/2022 1000   ALKPHOS 47 04/07/2023 0822   BILITOT 1.3 (H) 04/07/2023 0822   BILITOT 1.3 (H) 04/11/2022 1000   GFRNONAA 56 (L) 04/07/2023 0822   GFRNONAA 50 (L) 04/11/2022 1000   GFRAA >60 11/22/2019 1023    No results found for: SPEP, UPEP  Lab Results  Component Value Date   WBC 4.7 04/07/2023   NEUTROABS 2.9 04/07/2023   HGB 12.9 (L) 04/07/2023   HCT 38.8 (L) 04/07/2023   MCV 91.3 04/07/2023   PLT 145 (L) 04/07/2023      Chemistry      Component Value Date/Time   NA 139 04/07/2023 0822   NA 140 09/19/2022 0746   K 4.2 04/07/2023 0822   CL 106 04/07/2023 0822   CO2 28 04/07/2023 0822   BUN 27 (H) 04/07/2023 0822   BUN 34 (H) 09/19/2022 0746   CREATININE 1.28 (H) 04/07/2023  0822   CREATININE 1.43 (H) 04/11/2022 1000      Component Value Date/Time   CALCIUM  9.0 04/07/2023 0822   ALKPHOS 47 04/07/2023 0822   AST 18 04/07/2023 0822   AST 20 04/11/2022 1000   ALT 18 04/07/2023 0822   ALT 28 04/11/2022 1000   BILITOT 1.3 (H) 04/07/2023 0822   BILITOT 1.3 (H) 04/11/2022 1000       RADIOGRAPHIC STUDIES: Reviewed his prior CT imaging of the neck

## 2023-04-09 DIAGNOSIS — M25561 Pain in right knee: Secondary | ICD-10-CM | POA: Diagnosis not present

## 2023-04-11 ENCOUNTER — Other Ambulatory Visit: Payer: Medicare Other

## 2023-04-11 ENCOUNTER — Ambulatory Visit: Payer: Medicare Other | Admitting: Hematology and Oncology

## 2023-04-12 ENCOUNTER — Ambulatory Visit (HOSPITAL_BASED_OUTPATIENT_CLINIC_OR_DEPARTMENT_OTHER)
Admission: RE | Admit: 2023-04-12 | Discharge: 2023-04-12 | Disposition: A | Discharge status: Home / Self Care | Payer: Medicare Other | Source: Ambulatory Visit | Attending: Hematology and Oncology | Admitting: Hematology and Oncology

## 2023-04-12 DIAGNOSIS — C73 Malignant neoplasm of thyroid gland: Secondary | ICD-10-CM | POA: Insufficient documentation

## 2023-04-12 DIAGNOSIS — C8318 Mantle cell lymphoma, lymph nodes of multiple sites: Secondary | ICD-10-CM | POA: Diagnosis not present

## 2023-04-12 DIAGNOSIS — Z85850 Personal history of malignant neoplasm of thyroid: Secondary | ICD-10-CM | POA: Diagnosis not present

## 2023-04-12 DIAGNOSIS — I6523 Occlusion and stenosis of bilateral carotid arteries: Secondary | ICD-10-CM | POA: Diagnosis not present

## 2023-04-12 DIAGNOSIS — M47812 Spondylosis without myelopathy or radiculopathy, cervical region: Secondary | ICD-10-CM | POA: Diagnosis not present

## 2023-04-12 DIAGNOSIS — Z8572 Personal history of non-Hodgkin lymphomas: Secondary | ICD-10-CM | POA: Diagnosis not present

## 2023-04-12 MED ORDER — IOHEXOL 300 MG/ML  SOLN
75.0000 mL | Freq: Once | INTRAMUSCULAR | Status: AC | PRN
  Administered 2023-04-12: 75 mL via INTRAVENOUS

## 2023-04-17 ENCOUNTER — Inpatient Hospital Stay (HOSPITAL_BASED_OUTPATIENT_CLINIC_OR_DEPARTMENT_OTHER): Payer: Medicare Other | Admitting: Hematology and Oncology

## 2023-04-17 ENCOUNTER — Encounter: Payer: Self-pay | Admitting: Hematology and Oncology

## 2023-04-17 VITALS — BP 127/72 | HR 68 | Temp 97.7°F | Resp 18 | Ht 73.0 in | Wt 218.4 lb

## 2023-04-17 DIAGNOSIS — M1711 Unilateral primary osteoarthritis, right knee: Secondary | ICD-10-CM | POA: Diagnosis not present

## 2023-04-17 DIAGNOSIS — C8318 Mantle cell lymphoma, lymph nodes of multiple sites: Secondary | ICD-10-CM | POA: Diagnosis not present

## 2023-04-17 DIAGNOSIS — C73 Malignant neoplasm of thyroid gland: Secondary | ICD-10-CM

## 2023-04-17 DIAGNOSIS — Z85850 Personal history of malignant neoplasm of thyroid: Secondary | ICD-10-CM | POA: Diagnosis not present

## 2023-04-17 DIAGNOSIS — Z8572 Personal history of non-Hodgkin lymphomas: Secondary | ICD-10-CM | POA: Diagnosis not present

## 2023-04-17 DIAGNOSIS — M25561 Pain in right knee: Secondary | ICD-10-CM | POA: Diagnosis not present

## 2023-04-17 DIAGNOSIS — M62551 Muscle wasting and atrophy, not elsewhere classified, right thigh: Secondary | ICD-10-CM | POA: Diagnosis not present

## 2023-04-17 NOTE — Assessment & Plan Note (Signed)
He has no recurrent signs of lymphoma He is reassured I will see him again in 6 months for further follow-up

## 2023-04-17 NOTE — Progress Notes (Signed)
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   All questions were answered. The patient knows to call the clinic with any problems, questions or concerns. The total time spent in the appointment was 30 minutes encounter with patients including review of chart and various tests results, discussions about plan of care and coordination of care plan   Artis Delay, MD 04/17/2023 9:21 AM  INTERVAL HISTORY: Please see below for problem oriented charting. he returns for surveillance follow-up and review of imaging study results He is here accompanied by his wife We discussed multiple imaging studies We discussed future follow-up He is reassured that there is no signs of recurrent lymphoma or thyroid cancer  REVIEW OF SYSTEMS:   Constitutional: Denies fevers, chills or abnormal weight loss Eyes: Denies blurriness of vision Ears, nose, mouth, throat, and face: Denies mucositis or sore throat Respiratory: Denies cough, dyspnea or wheezes Cardiovascular: Denies palpitation, chest discomfort or lower extremity swelling Gastrointestinal:  Denies nausea, heartburn or change in bowel habits Skin: Denies abnormal skin rashes Lymphatics: Denies new lymphadenopathy or easy bruising Neurological:Denies numbness, tingling or new weaknesses Behavioral/Psych: Mood is stable, no  new changes  All other systems were reviewed with the patient and are negative.  I have reviewed the past medical history, past surgical history, social history and family history with the patient and they are unchanged from previous note.  ALLERGIES:  is allergic to levaquin [levofloxacin in d5w], nickel, pravastatin, simvastatin, allopurinol, ampicillin, and penicillin g.  MEDICATIONS:  Current Outpatient Medications  Medication Sig Dispense Refill   acetaminophen (TYLENOL) 500 MG tablet Take 1,000 mg by mouth daily as needed for moderate pain.     alfuzosin (UROXATRAL) 10 MG 24 hr tablet Take 10 mg by mouth daily. Alternate with Finasteride the evening     apixaban (ELIQUIS) 5 MG TABS tablet Take 1 tablet (5 mg total) by mouth 2 (two) times daily. 60 tablet 3   betamethasone dipropionate 0.05 % cream Apply 1 application  topically daily as needed (mosquito bites).     buPROPion (WELLBUTRIN XL) 150 MG 24 hr tablet Take 1 tablet (150 mg total) by mouth daily. 90 tablet 3   Cholecalciferol (VITAMIN D) 2000 units tablet Take 2,000 Units by mouth at bedtime.      COVID-19 mRNA vaccine 2023-2024 (COMIRNATY) syringe Inject into the muscle. 0.3 mL 0   ezetimibe (ZETIA) 10 MG tablet Take 1 tablet (10 mg total) by mouth every morning. 90 tablet 2   finasteride (PROSCAR) 5 MG tablet Take 5 mg by mouth every other day. Alternate with Alfuzosin in the evening     levothyroxine (SYNTHROID) 175 MCG tablet Take 1 tablet (175 mcg total) by mouth daily.     lidocaine (LMX) 4 % cream Apply 1 Application topically daily as needed (peripheral neuropathy in feet).     metoprolol succinate (TOPROL-XL) 25 MG 24  hr tablet Take 12.5 mg by mouth daily.     Polyethyl Glycol-Propyl Glycol (SYSTANE HYDRATION PF OP) Place 1 drop into both eyes 3 (three) times daily as needed (Dry eyes).     rosuvastatin (CRESTOR) 5 MG tablet Take 2 tablets by mouth Monday, wed, and fri     No current facility-administered medications  for this visit.    SUMMARY OF ONCOLOGIC HISTORY: Oncology History Overview Note  MIPI score 7.5 high risk   Mantle cell lymphoma (HCC)  06/13/2017 Imaging   Ct imaging at [facility] 1. 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 2. 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 3. Slight reduction in size of right retroperitoneal evolving hematoma.   03/30/2018 Imaging   Ct neck 1. Extensive adenopathy on the left. The largest node is a level 1/submandibular node measuring 38 x 24 x 22 mm. Largest level 2 node measures 3.2 x 2 x 2 cm. Numerous other smaller but round lymph nodes throughout the left neck in the level 2 through level 4 region. The largest level 5 node measures 3.4 x 3.4 x 2.3 cm with a transverse diameter of 2.3 cm. This contains some internal calcifications. There are numerous other pathologic nodes in the left supraclavicular to axillary region. This pattern of disease could be due to extensive left neck metastatic disease from unknown primary, lymphoma, or other systemic malignancy. 2. No evidence of mucosal or submucosal lesion. 3. Diameter of the ascending aorta is 4.5 cm. Recommend annual imaging followup by CTA or MRA. Aortic Atherosclerosis (ICD10-I70.0).     04/10/2018 Pathology Results   Left zone 1 neck mass, Fine Needle Aspiration I (smears and cell block):      Atypical lymphoid proliferation. See comment.       Specimen Adequacy:  Satisfactory for evaluation.  COMMENT:The aspirate demonstrates abundant small to medium sized lymphocytes with scattered epithelioid cells in the background which may be histiocytes.  The smears are cellular, but the cell block includes scant lymphocytes.  Immunohistochemical stains were attempted showing positive staining for CD20 with rare staining for CD3.  Cytokeratin AE1/AE3  is negative. S100 shows high nonspecific background staining but no specifically diagnostic cellular staining.  Overall, the findings indicate an atypical lymphoid proliferation but are too limited for definitive diagnosis.  Excisional biopsy with flow cytometry is recommended.   04/10/2018 Procedure   He was ENT who performed FNA of neck LN   04/27/2018 Pathology Results   Lymph node for lymphoma, [name], [name], [name], [name], [name], [name], [name], the features are consistent with a mantle cell lymphoma   05/13/2018 PET scan   1. Hypermetabolic left cervical lymphadenopathy, left axillary lymphadenopathy, mediastinal / right hilar lymphadenopathy, right external iliac lymphadenopathy, and subcentimeter left external iliac and left inguinal lymph nodes, [name], mildly increased  FDG activity in the spleen, [name], without focal lesions, that may represent lymphomatous involvement of the spleen.   05/21/2018 Cancer Staging   Staging form: Hodgkin and Non-Hodgkin Lymphoma, AJCC 8th Edition - Clinical: Stage III - Signed by Artis Delay, MD on 05/21/2018   05/26/2018 Procedure   Ultrasound and fluoroscopically guided right internal jugular single lumen power port catheter insertion. Tip in  the SVC/RA junction. Catheter ready for use.     06/01/2018 -  Chemotherapy   The patient had Bendamustine and Rituximab for chemotherapy treatment   08/24/2018 PET scan   1. Partial response to therapy with complete resolution of size and metabolic activity of the dominant LEFT submandibular node (level 1) and LEFT axillary nodes. 2. Persistent and increased metabolic activity of left level 3 lymph nodes ( Deauville 4). Nodes are partially calcified and similar size. 3. No new metastatic disease. 4. Normal spleen and bone marrow.   11/09/2018 PET scan   IMPRESSION: 1. Mild response to therapy of hypermetabolic left cervical nodes. (Deauville) 4. 2. No new or progressive disease. 3. Coronary artery atherosclerosis. Aortic Atherosclerosis (ICD10-I70.0). 4. Trace cul-de-sac fluid, similar.   12/18/2018 Imaging   1. 4 mm solid nodule in the right lower lobe, which is technically indeterminate. Recommend attention on follow-up per clinical protocol. 2. Small calcified nodule adjacent to the left lobe of the thyroid, which may represent thyroid nodule or calcified lymph node. Recommend correlation with thyroid ultrasound. 3. The ascending aorta appears mildly dilated, measuring up to 4.2cm. 4. Decreased left axillary lymphadenopathy.   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   Stranding with postsurgical changes along the right anterior neck anterior to the thyroid cartilage, favored to be related to recent surgery. Attention on follow-up is suggested.   No suspicious lymphadenopathy in the neck, chest, abdomen, or pelvis. Deauville criteria 1   11/22/2019 Imaging   1. No evidence of recurrent lymphoma in the chest, abdomen or pelvis. Normal size spleen. 2. 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 Recommend annual imaging followup by CTA or MRA. This recommendation follows  2010 [facility]/AHA/AATS/ACR/ASA/SCA/SCAI/SIR/STS/SVM Guidelines for the Diagnosis and Management of Patients with Thoracic Aortic Disease. Circulation. 2010; 121: Z610-R604. Aortic aneurysm NOS (ICD10-I71.9). Moderate diffuse colonic diverticulosis. Mild prostatomegaly. Aortic Atherosclerosis (ICD10-I70.0).   05/30/2020 Imaging   Stable exam. No evidence of recurrent lymphoma or metastatic disease within the chest, abdomen, or pelvis.   Colonic diverticulosis, without radiographic evidence of diverticulitis.   Tiny nonobstructing left renal calculus.   Stable mildly enlarged prostate.   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   Aortic and coronary atherosclerotic calcification.   02/05/2021 Procedure   Removal of implanted Port-A-Cath utilizing sharp and blunt dissection. The procedure was uncomplicated.   04/11/2021 Imaging   1. No evidence of mass or lymphadenopathy in the chest, abdomen, or pelvis. 2. Normal spleen. 3. Unchanged enlargement of the tubular ascending thoracic aorta, measuring up to 4.4 x 4.4 cm.  4. Cardiomegaly and coronary artery disease.     Thyroid cancer (HCC)  11/23/2018 Pathology Results   Lymph node for lymphoma, Left zone 3 - PAPILLARY THYROID CARCINOMA. - SEE MICROSCOPIC DESCRIPTION. Microscopic Comment The specimen consists mostly of encapsulated papillary thyroid carcinoma. There are portions of lymphoid tissue attached to the periphery of the specimen and in the adjacent adipose tissue which suggests that this may  represent papillary carcinoma metastatic to a lymph node.   11/23/2018 Surgery   PREOPERATIVE DIAGNOSIS:  Mantle cell lymphoma and cervical lymphadenopathy.   POSTOPERATIVE DIAGNOSIS:  Mantle cell lymphoma and cervical lymphadenopathy.   PROCEDURE:  Excisional biopsy of left cervical lymph nodes.     01/15/2019 Pathology Results   A. SOFT TISSUE, NECK, ADJACENT TO LEFT RECURRENT NERVE, BIOPSY: - Papillary thyroid carcinoma. B. LYMPH  NODES, LEFT NECK, ZONES 2,3,4, EXCISION: - Metastatic papillary thyroid carcinoma in 5 of 14 lymph nodes (5/14). C. ADDITIONAL LEFT LEVEL 4 TISSUE, EXCISION: - There is no evidence of carcinoma in 2 of 2 lymph nodes (0/2). D. TOTAL THYROIDECTOMY: - Papillary thyroid carcinoma, 0.8 cm. - Carcinoma is broadly present at an inked tissue edge. - See oncology table below. E. LEFT LEVEL 6 TISSUE, EXCISION: - Benign fibroadipose tissue. - There is no evidence of malignancy. F. LEFT RECURRENT NERVE AND SURROUNDING TISSUE, EXCISION: - Metastatic papillary thyroid carcinoma in 3 of 3 lymph nodes (3/3).  THYROID GLAND: Procedure: Thyroidectomy Tumor Focality: Unifocal Tumor Site: Left lobe Tumor Size: 0.8 cm Histologic Type: 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Margins: Carcinoma is broadly present at a black inked tissue edge. 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   PROCEDURE:  Total thyroidectomy and left modified radical neck dissection.   SURGEON:  Christia Reading, MD     FINDINGS:  There was a firm nodule in zone IIB on the left side and also some firm tissue and adherence to the jugular vein in zone III region requiring removal of a portion of the vein.  There was a firm nodule in the left thyroid lobe that extended posterior to the gland, adhering to the trachea, and encompassing the recurrent laryngeal  nerve.  Tissue was sent for frozen section from adjacent to the nerve, demonstrating papillary carcinoma.  The nerve stimulated distal to the mass but not proximal to the mass; thus, the nerve was removed with the mass.  The left-sided parathyroid glands were visualized, as was the right superior parathyroid gland.  The right recurrent nerve was kept safe and stimulated well at the end of the case.   05/30/2020 Imaging   Negative for recurrent mass or adenopathy in the neck   Left vocal cord paresis.   04/03/2021 Cancer Staging   Staging form: Thyroid - Differentiated, AJCC 8th Edition - Pathologic stage from 04/03/2021: pT1a, pN1, cM0 - Signed by Artis Delay, MD on 04/03/2021 Lymph node metastasis: Present   04/11/2021 Imaging   No change from the prior study.  No recurrent mass or adenopathy   Postop changes left neck, stable   Chronic paresis left vocal cord unchanged.   04/12/2023 Imaging   CT Soft Tissue Neck W Contrast Result Date: 04/16/2023 CLINICAL DATA:  History of lymphoma and thyroid cancer. Left supraclavicular mass. EXAM: CT NECK WITH CONTRAST TECHNIQUE: Multidetector CT imaging of the neck was performed using the standard protocol following the bolus administration of intravenous contrast. RADIATION DOSE REDUCTION: This exam was performed according  to the departmental dose-optimization program which includes automated exposure control, adjustment of the mA and/or kV according to patient size and/or use of iterative reconstruction technique. CONTRAST:  75mL OMNIPAQUE IOHEXOL 300 MG/ML  SOLN COMPARISON:  Neck CT 12/30/2022 FINDINGS: Pharynx and larynx: No evidence of a mass or swelling. Unchanged chronic findings of left vocal cord paresis. Salivary glands: No inflammation, mass, or stone. Thyroid: Prior thyroidectomy.  No locally recurrent mass. Lymph nodes: The palpable abnormality in the left supraclavicular region corresponds to a superficial 5 mm subcutaneous nodule presumed to  reflect a small lymph node (series 2, image 98), and this is smaller than a 7 mm nodule which was present in this location on the prior study. No enlarged or suspicious lymph nodes are identified elsewhere in the neck. Post treatment changes are noted in the left neck. Vascular: Poorly visualized/potentially absent left internal jugular vein in the mid and lower neck as before. Mild atherosclerosis at the carotid bifurcations. Limited intracranial: Unremarkable. Visualized orbits: Bilateral cataract extraction. Mastoids and visualized paranasal sinuses: Clear. Skeleton: Moderately advanced cervical spondylosis and facet arthrosis. Interbody ankylosis at multiple levels in the included upper thoracic spine. No suspicious bone lesion. Upper chest: Clear lung apices. Other: None. IMPRESSION: 5 mm superficial lymph node in the left supraclavicular region, smaller than on 12/30/2022. No new or progressive findings in the neck. Electronically Signed   By: Sebastian Ache M.D.   On: 04/16/2023 16:52   CUP PACEART REMOTE DEVICE CHECK Result Date: 03/31/2023 ILR summary report received. Battery status OK. Normal device function. No new symptom, tachy, brady, or pause episodes. No new AF episodes. Monthly summary reports and ROV/PRN - CS, CVRS       PHYSICAL EXAMINATION: ECOG PERFORMANCE STATUS: 0 - Asymptomatic  Vitals:   04/17/23 0909  BP: 127/72  Pulse: 68  Resp: 18  Temp: 97.7 F (36.5 C)  SpO2: 97%   Filed Weights   04/17/23 0909  Weight: 218 lb 6.4 oz (99.1 kg)    GENERAL:alert, no distress and comfortable LABORATORY DATA:  I have reviewed the data as listed    Component Value Date/Time   NA 139 04/07/2023 0822   NA 140 09/19/2022 0746   K 4.2 04/07/2023 0822   CL 106 04/07/2023 0822   CO2 28 04/07/2023 0822   GLUCOSE 94 04/07/2023 0822   BUN 27 (H) 04/07/2023 0822   BUN 34 (H) 09/19/2022 0746   CREATININE 1.28 (H) 04/07/2023 0822   CREATININE 1.43 (H) 04/11/2022 1000   CALCIUM 9.0  04/07/2023 0822   PROT 6.1 (L) 04/07/2023 0822   PROT 6.0 09/03/2021 0749   ALBUMIN 4.1 04/07/2023 0822   ALBUMIN 4.3 09/03/2021 0749   AST 18 04/07/2023 0822   AST 20 04/11/2022 1000   ALT 18 04/07/2023 0822   ALT 28 04/11/2022 1000   ALKPHOS 47 04/07/2023 0822   BILITOT 1.3 (H) 04/07/2023 0822   BILITOT 1.3 (H) 04/11/2022 1000   GFRNONAA 56 (L) 04/07/2023 0822   GFRNONAA 50 (L) 04/11/2022 1000   GFRAA >60 11/22/2019 1023    No results found for: "SPEP", "UPEP"  Lab Results  Component Value Date   WBC 4.7 04/07/2023   NEUTROABS 2.9 04/07/2023   HGB 12.9 (L) 04/07/2023   HCT 38.8 (L) 04/07/2023   MCV 91.3 04/07/2023   PLT 145 (L) 04/07/2023      Chemistry      Component Value Date/Time   NA 139 04/07/2023 0822   NA 140 09/19/2022 0746  K 4.2 04/07/2023 0822   CL 106 04/07/2023 0822   CO2 28 04/07/2023 0822   BUN 27 (H) 04/07/2023 0822   BUN 34 (H) 09/19/2022 0746   CREATININE 1.28 (H) 04/07/2023 0822   CREATININE 1.43 (H) 04/11/2022 1000      Component Value Date/Time   CALCIUM 9.0 04/07/2023 0822   ALKPHOS 47 04/07/2023 0822   AST 18 04/07/2023 0822   AST 20 04/11/2022 1000   ALT 18 04/07/2023 0822   ALT 28 04/11/2022 1000   BILITOT 1.3 (H) 04/07/2023 0822   BILITOT 1.3 (H) 04/11/2022 1000       RADIOGRAPHIC STUDIES: I reviewed multiple imaging studies with the patient and his wife I have personally reviewed the radiological images as listed and agreed with the findings in the report. CT Soft Tissue Neck W Contrast Result Date: 04/16/2023 CLINICAL DATA:  History of lymphoma and thyroid cancer. Left supraclavicular mass. EXAM: CT NECK WITH CONTRAST TECHNIQUE: Multidetector CT imaging of the neck was performed using the standard protocol following the bolus administration of intravenous contrast. RADIATION DOSE REDUCTION: This exam was performed according to the departmental dose-optimization program which includes automated exposure control, adjustment of  the mA and/or kV according to patient size and/or use of iterative reconstruction technique. CONTRAST:  75mL OMNIPAQUE IOHEXOL 300 MG/ML  SOLN COMPARISON:  Neck CT 12/30/2022 FINDINGS: Pharynx and larynx: No evidence of a mass or swelling. Unchanged chronic findings of left vocal cord paresis. Salivary glands: No inflammation, mass, or stone. Thyroid: Prior thyroidectomy.  No locally recurrent mass. Lymph nodes: The palpable abnormality in the left supraclavicular region corresponds to a superficial 5 mm subcutaneous nodule presumed to reflect a small lymph node (series 2, image 98), and this is smaller than a 7 mm nodule which was present in this location on the prior study. No enlarged or suspicious lymph nodes are identified elsewhere in the neck. Post treatment changes are noted in the left neck. Vascular: Poorly visualized/potentially absent left internal jugular vein in the mid and lower neck as before. Mild atherosclerosis at the carotid bifurcations. Limited intracranial: Unremarkable. Visualized orbits: Bilateral cataract extraction. Mastoids and visualized paranasal sinuses: Clear. Skeleton: Moderately advanced cervical spondylosis and facet arthrosis. Interbody ankylosis at multiple levels in the included upper thoracic spine. No suspicious bone lesion. Upper chest: Clear lung apices. Other: None. IMPRESSION: 5 mm superficial lymph node in the left supraclavicular region, smaller than on 12/30/2022. No new or progressive findings in the neck. Electronically Signed   By: Sebastian Ache M.D.   On: 04/16/2023 16:52   CUP PACEART REMOTE DEVICE CHECK Result Date: 03/31/2023 ILR summary report received. Battery status OK. Normal device function. No new symptom, tachy, brady, or pause episodes. No new AF episodes. Monthly summary reports and ROV/PRN - CS, CVRS

## 2023-04-17 NOTE — Assessment & Plan Note (Signed)
I have reviewed imaging study with the patient He has no signs of cancer recurrence He is reassured I will see him again in 6 months for further follow-up

## 2023-04-20 DIAGNOSIS — M25562 Pain in left knee: Secondary | ICD-10-CM | POA: Diagnosis not present

## 2023-04-23 ENCOUNTER — Telehealth: Payer: Self-pay | Admitting: Cardiovascular Disease

## 2023-04-23 NOTE — Telephone Encounter (Signed)
   Pre-operative Risk Assessment    Patient Name: Sean Stanley  DOB: Jan 22, 1941 MRN: [id number]   Date of last office visit: 01/31/2024  Date of next office visit: 05/26/2023   Request for Surgical Clearance    Procedure:   right total knee arthroplasty  Date of Surgery:  Clearance 08/04/23                                Surgeon:  Dr. Ollen Gross Surgeon's Group or Practice Name:  [facility] Phone number:  [phone] Fax number:  [phone] [name]   Type of Clearance Requested:   - Medical  - Pharmacy:  Hold Apixaban (Eliquis) Please instruct   Type of Anesthesia:   choice   Additional requests/questions:    [name]   04/23/2023, 10:27 AM

## 2023-04-25 NOTE — Telephone Encounter (Signed)
   Name: Sean Stanley  DOB: [birth year]  MRN: [id number]  Primary Cardiologist: None  Chart reviewed as part of pre-operative protocol coverage. The patient has an upcoming visit scheduled with Dr. Elease Hashimoto on 07/22/2023 at which time clearance can be addressed in case there are any issues that would impact surgical recommendations.  Right total knee arthroplasty is not scheduled until 08/04/2023 as below. I added preop FYI to appointment note so that provider is aware to address at time of outpatient visit.  Per office protocol the cardiology provider should forward their finalized clearance decision and recommendations regarding antiplatelet therapy to the requesting party below.    This message will also be routed to pharmacy pool for input on holding Eliquis as requested below so that this information is available to the clearing provider at time of patient's appointment.   I will route this message as FYI to requesting party and remove this message from the preop box as separate preop APP input not needed at this time.   Please call with any questions.  Joylene Grapes, NP  04/25/2023, 12:37 PM

## 2023-04-26 NOTE — Telephone Encounter (Signed)
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    Procedure:   right total knee arthroplasty   Date of Surgery:  Clearance 08/04/23   CHA2DS2-VASc Score = 2   This indicates a 2.2% annual risk of stroke. The patient's score is based upon: CHF History: 0 HTN History: 0 Diabetes History: 0 Stroke History: 0 Vascular Disease History: 0 Age Score: 2 Gender Score: 0   CrCl 63 Platelet count 145  Per office protocol, patient can hold Eliquis for 3 days prior to procedure.   Patient will not need bridging with Lovenox (enoxaparin) around procedure.  **This guidance is not considered finalized until pre-operative APP has relayed final recommendations.**

## 2023-04-28 DIAGNOSIS — M62551 Muscle wasting and atrophy, not elsewhere classified, right thigh: Secondary | ICD-10-CM | POA: Diagnosis not present

## 2023-04-28 DIAGNOSIS — M25561 Pain in right knee: Secondary | ICD-10-CM | POA: Diagnosis not present

## 2023-04-28 DIAGNOSIS — M1711 Unilateral primary osteoarthritis, right knee: Secondary | ICD-10-CM | POA: Diagnosis not present

## 2023-04-28 NOTE — Telephone Encounter (Signed)
   Name: Sean Stanley  DOB: Aug 09, 1940  MRN: [id number]  Primary Cardiologist: None   Preoperative team, please contact this patient and set up a phone call appointment for further preoperative risk assessment. Please obtain consent and complete medication review. Thank you for your help.  I confirm that guidance regarding antiplatelet and oral anticoagulation therapy has been completed and, if necessary, noted below.  Per office protocol, patient can hold Eliquis for 3 days prior to procedure.   Patient will not need bridging with Lovenox (enoxaparin) around procedure.  I also confirmed the patient resides in the state of [place]. As per [facility] Medical Board telemedicine laws, the patient must reside in the state in which the provider is licensed.   Ronney Asters, NP 04/28/2023, 9:42 AM [facility]

## 2023-04-28 NOTE — Telephone Encounter (Signed)
Pt scheduled to see Dr. Elease Hashimoto 07/22/23, clearance will be addressed at that time.  Will route to the requesting surgeon's office to make them aware.

## 2023-04-29 ENCOUNTER — Ambulatory Visit: Payer: Medicare Other | Attending: Internal Medicine | Admitting: Pharmacist

## 2023-04-29 VITALS — BP 118/68 | HR 64

## 2023-04-29 DIAGNOSIS — Z79899 Other long term (current) drug therapy: Secondary | ICD-10-CM | POA: Insufficient documentation

## 2023-04-29 NOTE — Assessment & Plan Note (Addendum)
 Assessment: Blood pressure is well-controlled in clinic today. His home readings are somewhat variable but average about to 135/79 He does not rest much prior to checking Several readings in the 120s and low 130s systolic Patient just started physical therapy and plans to go back to water  aerobics  Plan: Continue current medications Continue to monitor blood pressure at home Patient instructed to call me if blood pressure is consistently above 135/85

## 2023-04-29 NOTE — Patient Instructions (Addendum)
 Your blood pressure goal is < 135/23mmHg   Continue to check your blood pressure a few times a week. Please call us  if blood pressure is consistently over 135/85. [phone] Make sure you rest 5 min before you check your blood pressure. Continue with water  aerobics and physical therapy  Important lifestyle changes to control high blood pressure  Intervention  Effect on the BP   Weight loss Weight loss is one of the most effective lifestyle changes for controlling blood pressure. If you're overweight or obese, losing even a small amount of weight can help reduce blood pressure.    Blood pressure can decrease by 1 millimeter of mercury (mmHg) with each kilogram (about 2.2 pounds) of weight lost.   Exercise regularly As a general goal, aim for 30 minutes of moderate physical activity every day.    Regular physical activity can lower blood pressure by 5 - 8 mmHg.   Eat a healthy diet Eat a diet rich in whole grains, fruits, vegetables, lean meat, and low-fat dairy products. Limit processed foods, saturated fat, and sweets.    A heart-healthy diet can lower high blood pressure by 10 mmHg.   Reduce salt (sodium) in your diet Aim for 000mg  of sodium each day. Avoid deli meats, canned food, and frozen microwave meals which are high in sodium.     Limiting sodium can reduce blood pressure by 5 mmHg.   Limit alcohol  One drink equals 12 ounces of beer, 5 ounces of wine, or 1.5 ounces of 80-proof liquor.    Limiting alcohol  to < 1 drink a day for women or < 2 drinks a day for men can help lower blood pressure by about 4 mmHg.   To check your pressure at home you will need to:   Sit up in a chair, with feet flat on the floor and back supported. Do not cross your ankles or legs. Rest your left arm so that the cuff is about heart level. If the cuff goes on your upper arm, then just relax your arm on the table, arm of the chair, or your lap. If you have a wrist cuff, hold your wrist  against your chest at heart level. Place the cuff snugly around your arm, about 1 inch above the crease of your elbow. The cords should be inside the groove of your elbow.  Sit quietly, with the cuff in place, for about 5 minutes. Then press the power button to start a reading. Do not talk or move while the reading is taking place.  Record your readings on a sheet of paper. Although most cuffs have a memory, it is often easier to see a pattern developing when the numbers are all in front of you.  You can repeat the reading after 1-3 minutes if it is recommended.   Make sure your bladder is empty and you have not had caffeine or tobacco within the last 30 minutes   Always bring your blood pressure log with you to your appointments. If you have not brought your monitor in to be double checked for accuracy, please bring it to your next appointment.   You can find a list of validated (accurate) blood pressure cuffs at: validatebp.org
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
## 2023-04-30 DIAGNOSIS — L820 Inflamed seborrheic keratosis: Secondary | ICD-10-CM | POA: Diagnosis not present

## 2023-04-30 DIAGNOSIS — Z85828 Personal history of other malignant neoplasm of skin: Secondary | ICD-10-CM | POA: Diagnosis not present

## 2023-05-02 DIAGNOSIS — M1711 Unilateral primary osteoarthritis, right knee: Secondary | ICD-10-CM | POA: Diagnosis not present

## 2023-05-02 DIAGNOSIS — M62551 Muscle wasting and atrophy, not elsewhere classified, right thigh: Secondary | ICD-10-CM | POA: Diagnosis not present

## 2023-05-02 DIAGNOSIS — M25561 Pain in right knee: Secondary | ICD-10-CM | POA: Diagnosis not present

## 2023-05-05 ENCOUNTER — Ambulatory Visit (INDEPENDENT_AMBULATORY_CARE_PROVIDER_SITE_OTHER): Payer: Medicare Other

## 2023-05-05 DIAGNOSIS — I480 Paroxysmal atrial fibrillation: Secondary | ICD-10-CM

## 2023-05-05 LAB — CUP PACEART REMOTE DEVICE CHECK
Date Time Interrogation Session: 20250209231145
Implantable Pulse Generator Implant Date: 20230105

## 2023-05-07 NOTE — Progress Notes (Signed)
[facility] Summary Report / Loop Recorder

## 2023-05-13 ENCOUNTER — Encounter
Payer: Medicare Other | Attending: Physical Medicine and Rehabilitation | Admitting: Physical Medicine and Rehabilitation

## 2023-05-13 VITALS — BP 131/82 | HR 62 | Ht 73.0 in | Wt 221.0 lb

## 2023-05-13 DIAGNOSIS — G608 Other hereditary and idiopathic neuropathies: Secondary | ICD-10-CM | POA: Insufficient documentation

## 2023-05-13 DIAGNOSIS — R29898 Other symptoms and signs involving the musculoskeletal system: Secondary | ICD-10-CM | POA: Diagnosis not present
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
## 2023-05-13 NOTE — Patient Instructions (Addendum)
[place]  Insomnia: -Try to go outside near sunrise -Get exercise during the day.  -Turn off all devices an hour before bedtime.  -Teas that can benefit: chamomile, valerian root, Brahmi (Bacopa) -Can consider over the counter melatonin, magnesium, and/or L-theanine. Melatonin is an anti-oxidant with multiple health benefits. Magnesium is involved in greater than 300 enzymatic reactions in the body and most of [place] are deficient as our soil is often depleted. There are 7 different types of magnesium- Bioptemizer's is a supplement with all 7 types, and each has unique benefits. Magnesium can also help with constipation and anxiety.  -Pistachios naturally increase the production of melatonin -Cozy Earth bamboo bed sheets are free from toxic chemicals.  -Tart cherry juice or a tart cherry supplement can improve sleep and soreness post-workout    HTN: -BP is 131/82 today.  -Advised checking BP daily at home and logging results to bring into follow-up appointment with PCP and myself. -Reviewed BP meds today.  -Advised regarding healthy foods that can help lower blood pressure and provided with a list: 1) citrus foods- high in vitamins and minerals 2) salmon and other fatty fish - reduces inflammation and oxylipins 3) swiss chard (leafy green)- high level of nitrates 4) pumpkin seeds- one of the best natural sources of magnesium 5) Beans and lentils- high in fiber, magnesium, and potassium 6) Berries- high in flavonoids 7) Amaranth (whole grain, can be cooked similarly to rice and oats)- high in magnesium and fiber 8) Pistachios- even more effective at reducing BP than other nuts 9) Carrots- high in phenolic compounds that relax blood vessels and reduce inflammation 10) Celery- contain phthalides that relax tissues of arterial walls 11) Tomatoes- can also improve cholesterol and reduce risk of heart disease 12) Broccoli- good source of magnesium, calcium, and potassium 13) Greek yogurt: high in  potassium and calcium 14) Herbs and spices: Celery seed, cilantro, saffron, lemongrass, black cumin, ginseng, cinnamon, cardamom, sweet basil, and ginger 15) Chia and flax seeds- also help to lower cholesterol and blood sugar 16) Beets- high levels of nitrates that relax blood vessels  17) spinach and bananas- high in potassium  -Provided lise of supplements that can help with hypertension:  1) magnesium: one high quality brand is Bioptemizers since it contains all 7 types of magnesium, otherwise over the counter magnesium gluconate 400mg  is a good option 2) B vitamins 3) vitamin D 4) potassium 5) CoQ10 6) L-arginine 7) Vitamin C 8) Beetroot -Educated that goal BP is 120/80. -Made goal to incorporate some of the above foods into diet.

## 2023-05-15 ENCOUNTER — Encounter: Payer: Self-pay | Admitting: Cardiovascular Disease

## 2023-05-16 DIAGNOSIS — M25561 Pain in right knee: Secondary | ICD-10-CM | POA: Diagnosis not present

## 2023-05-16 DIAGNOSIS — M62551 Muscle wasting and atrophy, not elsewhere classified, right thigh: Secondary | ICD-10-CM | POA: Diagnosis not present

## 2023-05-16 DIAGNOSIS — M1711 Unilateral primary osteoarthritis, right knee: Secondary | ICD-10-CM | POA: Diagnosis not present

## 2023-05-21 ENCOUNTER — Encounter (INDEPENDENT_AMBULATORY_CARE_PROVIDER_SITE_OTHER): Payer: Medicare Other | Admitting: Ophthalmology

## 2023-05-21 DIAGNOSIS — H33303 Unspecified retinal break, bilateral: Secondary | ICD-10-CM | POA: Diagnosis not present

## 2023-05-21 DIAGNOSIS — H43813 Vitreous degeneration, bilateral: Secondary | ICD-10-CM | POA: Diagnosis not present

## 2023-05-22 ENCOUNTER — Telehealth: Payer: Self-pay | Admitting: Cardiovascular Disease

## 2023-05-22 DIAGNOSIS — N400 Enlarged prostate without lower urinary tract symptoms: Secondary | ICD-10-CM | POA: Insufficient documentation

## 2023-05-22 DIAGNOSIS — H40013 Open angle with borderline findings, low risk, bilateral: Secondary | ICD-10-CM | POA: Insufficient documentation

## 2023-05-22 DIAGNOSIS — Z8572 Personal history of non-Hodgkin lymphomas: Secondary | ICD-10-CM | POA: Insufficient documentation

## 2023-05-22 DIAGNOSIS — C73 Malignant neoplasm of thyroid gland: Secondary | ICD-10-CM | POA: Insufficient documentation

## 2023-05-22 DIAGNOSIS — C8590 Non-Hodgkin lymphoma, unspecified, unspecified site: Secondary | ICD-10-CM | POA: Insufficient documentation

## 2023-05-22 DIAGNOSIS — Z7729 Contact with and (suspected ) exposure to other hazardous substances: Secondary | ICD-10-CM | POA: Insufficient documentation

## 2023-05-22 NOTE — Telephone Encounter (Signed)
   Name: Sean Stanley  DOB: [birth year]  MRN: [id number]  Primary Cardiologist: None  Chart reviewed as part of pre-operative protocol coverage. The patient has an upcoming visit scheduled with Canary Brim, NP on 05/26/2023 at which time clearance can be addressed in case there are any issues that would impact surgical recommendations.  Right total knee arthroplasty Is not scheduled until 06/16/2023 as below. I added preop FYI to appointment note so that provider is aware to address at time of outpatient visit.  Per office protocol the cardiology provider should forward their finalized clearance decision and recommendations regarding antiplatelet therapy to the requesting party below.    This message will also be routed to pharmacy pool for input on holding Eliquis as requested below so that this information is available to the clearing provider at time of patient's appointment.   I will route this message as FYI to requesting party and remove this message from the preop box as separate preop APP input not needed at this time.   Please call with any questions.  Joylene Grapes, NP  05/22/2023, 4:34 PM

## 2023-05-22 NOTE — Telephone Encounter (Signed)
 Patient with diagnosis of A Fib on Eliquis for anticoagulation.    Procedure: right total knee arthroplasty  Date of procedure: 06/16/23   CHA2DS2-VASc Score = 5  This indicates a 7.2% annual risk of stroke. The patient's score is based upon: CHF History: 1 HTN History: 1 Diabetes History: 0 Stroke History: 0 Vascular Disease History: 1 Age Score: 2 Gender Score: 0      CrCl 63 ml.min Platelet count 145K   Per office protocol, patient can hold Eliquis for 3 days prior to procedure.    **This guidance is not considered finalized until pre-operative APP has relayed final recommendations.**

## 2023-05-22 NOTE — Telephone Encounter (Signed)
   Pre-operative Risk Assessment    Patient Name: Sean Stanley  DOB: [birth year] MRN: [id number]   Date of last office visit: 03/17/2023 Date of next office visit: 05/26/2023   Request for Surgical Clearance    Procedure:   right total knee arthroplasty  Date of Surgery:  Clearance 06/16/23                                Surgeon:  Dr. Ollen Gross Surgeon's Group or Practice Name:  [facility] Phone number:  [phone] Fax number:  [phone] [name]   Type of Clearance Requested:   - Medical  - Pharmacy:  Hold Apixaban (Eliquis) need instructions   Type of Anesthesia:   choice   Additional requests/questions:    [name]   05/22/2023, 4:21 PM

## 2023-05-24 NOTE — Progress Notes (Unsigned)
 Electrophysiology Office Note:   Date:  05/26/2023  ID:  Sean Stanley, DOB 01/15/1941, MRN [id number]  Primary Cardiologist: None Primary Heart Failure: None Electrophysiologist: Will Jorja Loa, MD      History of Present Illness:   Sean Stanley is a 83 y.o. 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   Since last being seen in our clinic the patient reports doing very well. No known issues with AF since his ablation in July 2024.  He does water aerobics with his wife and remains active. No changes in activity tolerance or dyspnea with exertion.   He denies chest pain, palpitations, dyspnea, PND, orthopnea, nausea, vomiting, dizziness, syncope, edema, weight gain, or early satiety.   Review of systems complete and found to be negative unless listed in HPI.   EP Information / Studies Reviewed:    EKG is ordered today. Personal review as below.  EKG Interpretation Date/Time:  Monday May 26 2023 15:59:42 EST Ventricular Rate:  60 PR Interval:  190 QRS Duration:  104 QT Interval:  460 QTC Calculation: 460 R Axis:   4  Text Interpretation: Normal sinus rhythm Normal ECG Confirmed by Canary Brim ([id number]) on 05/26/2023 4:20:54 PM   Studies:  ECHO 10/2021 > LVEF 50-55% EPS 09/2022 > RF PVI ablation, additional posterior wall LA ablation   Arrhythmia / AAD AFL  Amiodarone 03/2022 >  12/2022 terrible dreams on it, stopped post ablation   Device Information: MDT ILR 03/29/21    Risk Assessment/Calculations:    CHA2DS2-VASc Score = 5   This indicates a 7.2% annual risk of stroke. The patient's score is based upon: CHF History: 1 HTN History: 1 Diabetes History: 0 Stroke History: 0 Vascular Disease History: 1 Age Score: 2 Gender Score: 0              Physical Exam:   VS:  BP 120/78   Pulse 60   Ht 6\' 1"  (1.854 m)   Wt 219 lb (99.3 kg)   SpO2 97%   BMI 28.89 kg/m    Wt Readings from Last 3 Encounters:  05/26/23 219 lb (99.3 kg)  05/13/23 221 lb (100.2 kg)  04/17/23 218 lb 6.4 oz (99.1 kg)     GEN: Well nourished, well developed in no acute distress NECK: No JVD; No carotid bruits CARDIAC: Regular rate and rhythm, no murmurs, rubs, gallops RESPIRATORY:  Clear to auscultation without rales, wheezing or rhonchi  ABDOMEN: Soft, non-tender, non-distended EXTREMITIES:  No edema; No deformity    ASSESSMENT AND PLAN:    Paroxysmal AF AFL CHA2DS2-VASc 5, s/p ablation  -no symptom burden  -ILR review shows no AF in monitoring period of 12/4-05/26/23 -continue OAC for stroke prophylaxis   Secondary Hypercoagulable State  -continue Eliquis 5mg  BID, dose reviewed and appropriate by age / wt   VHD s/p MV Repair  -functioning well on last ECHO -per primary Cardiolgoy   Hypertension  -well controlled on current regimen > BP excellent in clinic.   -wife has record with her of home BP's with some elevated pressures 140-155 systolic > he has been working with pharmacy on HTN and lipids.  Will defer changes for now in light of pending surgery.   Vascular Aneurysms  Hx of RP bleed  -per VVS   NICM  Recovered LVEF  -  euvolemic on exam     Preoperative Clearance Procedure: right total knee arthroplasty  Date of Surgery:  Clearance 06/16/23                             Surgeon:  Dr. Ollen Gross Surgeon's Group or Practice Name:  [facility] Phone number:  [phone] Fax number:  [phone] [name] Type of Clearance Requested:   -Medical  -Pharmacy:  Hold Apixaban (Eliquis) for 3 days prior to surgery Type of Anesthesia:   choice  Click Here to Calculate RCRI      :[id number]}   [name] perioperative risk of a major cardiac event is 0.9% according to the Revised Cardiac Risk Index (RCRI).  Therefore, he is at  low risk for perioperative complications.     Recommendations: According to ACC/AHA guidelines, no further cardiovascular testing needed.  The patient may proceed to surgery at acceptable risk.    Antiplatelet and/or Anticoagulation Recommendations:  Eliquis (Apixaban) can be held for 3 days prior to surgery.  Please resume post op when felt to be safe.    Follow up with Dr. Elberta Fortis or Canary Brim, NP in 6 months  Signed, Canary Brim, NP-C, AGACNP-BC [facility] - Electrophysiology  05/26/2023, 5:09 PM

## 2023-05-26 ENCOUNTER — Encounter: Payer: Medicare Other | Admitting: Cardiology

## 2023-05-26 ENCOUNTER — Encounter: Payer: Self-pay | Admitting: Pulmonary Disease

## 2023-05-26 ENCOUNTER — Ambulatory Visit: Payer: Medicare Other | Attending: Pulmonary Disease | Admitting: Pulmonary Disease

## 2023-05-26 VITALS — BP 120/78 | HR 60 | Ht 73.0 in | Wt 219.0 lb

## 2023-05-26 DIAGNOSIS — Z9889 Other specified postprocedural states: Secondary | ICD-10-CM | POA: Insufficient documentation

## 2023-05-26 DIAGNOSIS — Z01818 Encounter for other preprocedural examination: Secondary | ICD-10-CM | POA: Diagnosis not present

## 2023-05-26 DIAGNOSIS — D6869 Other thrombophilia: Secondary | ICD-10-CM | POA: Insufficient documentation

## 2023-05-26 DIAGNOSIS — I480 Paroxysmal atrial fibrillation: Secondary | ICD-10-CM | POA: Diagnosis not present

## 2023-05-26 DIAGNOSIS — I428 Other cardiomyopathies: Secondary | ICD-10-CM | POA: Insufficient documentation

## 2023-05-26 DIAGNOSIS — Z95818 Presence of other cardiac implants and grafts: Secondary | ICD-10-CM | POA: Diagnosis not present

## 2023-05-26 NOTE — Patient Instructions (Signed)
Medication Instructions:   Your physician recommends that you continue on your current medications as directed. Please refer to the Current Medication list given to you today.  *If you need a refill on your cardiac medications before your next appointment, please call your pharmacy*   Lab Work: NONE ORDERED  TODAY   If you have labs (blood work) drawn today and your tests are completely normal, you will receive your results only by: MyChart Message (if you have MyChart) OR A paper copy in the mail If you have any lab test that is abnormal or we need to change your treatment, we will call you to review the results.   Testing/Procedures: NONE ORDERED  TODAY    Follow-Up: At [facility], you and your health needs are our priority.  As part of our continuing mission to provide you with exceptional heart care, we have created designated Provider Care Teams.  These Care Teams include your primary Cardiologist (physician) and Advanced Practice Providers (APPs -  Physician Assistants and Nurse Practitioners) who all work together to provide you with the care you need, when you need it.  We recommend signing up for the patient portal called "MyChart".  Sign up information is provided on this After Visit Summary.  MyChart is used to connect with patients for Virtual Visits (Telemedicine).  Patients are able to view lab/test results, encounter notes, upcoming appointments, etc.  Non-urgent messages can be sent to your provider as well.   To learn more about what you can do with MyChart, go to [email or web address].    Your next appointment:   6 month(s)  Provider:   You may see Will Jorja Loa, MD  or one of the following Advanced Practice Providers on your designated Care Team:   Francis Dowse, [place] [address] [place], [place] Sherie Don, NP Canary Brim, NP    Other Instructions

## 2023-05-30 DIAGNOSIS — M1711 Unilateral primary osteoarthritis, right knee: Secondary | ICD-10-CM | POA: Diagnosis not present

## 2023-06-03 NOTE — H&P (Signed)
 TOTAL KNEE ADMISSION H&P  Patient is being admitted for right total knee arthroplasty.  Subjective:  Chief Complaint: Right knee pain.  HPI: Sean Stanley, 83 y.o. male has a history of pain and functional disability in the right knee due to arthritis and has failed non-surgical conservative treatments for greater than 12 weeks to include corticosteriod injections, viscosupplementation injections, and activity modification. Onset of symptoms was gradual, starting several years ago with gradually worsening course since that time. The patient noted no past surgery on the right knee.  Patient currently rates pain in the right knee at 8 out of 10 with activity. Patient has worsening of pain with activity and weight bearing and pain that interferes with activities of daily living. Patient has evidence of periarticular osteophytes, joint subluxation, and joint space narrowing by imaging studies. There is no active infection.  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   Vitreous degeneration 12/26/2022   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   Ptosis of eyelid 12/26/2022   Open-angle glaucoma 12/26/2022   Dry eyes 12/26/2022   Dermatochalasis of right eyelid 12/26/2022   Dermatochalasis of left eyelid 12/26/2022   Pseudophakia 12/26/2022   Hypotension 04/11/2022   Hypercoagulable state due to paroxysmal atrial fibrillation (HCC) 05/28/2021   Paroxysmal atrial  fibrillation (HCC) 01/02/2021   Coronary atherosclerosis 11/16/2020   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   Agent orange exposure 05/17/2020   Ascending aortic aneurysm (HCC) 11/23/2019   Peripheral sensory neuropathy 11/17/2019   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   Deficiency anemia 04/19/2019   Vitamin D deficiency 04/19/2019   Hypocalcemia 04/19/2019   Speech and language deficits 02/22/2019   Vocal fold paralysis, 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   Thyroid cancer (HCC) 11/26/2018   Hyperlipidemia 11/03/2018   Neck discomfort 09/23/2018   Other constipation 08/26/2018   Pancytopenia, acquired (HCC) 07/28/2018   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   Cervical lymphadenopathy 04/10/2018   Insomnia secondary to chronic pain 10/28/2017   OSA (obstructive sleep apnea) 10/28/2017   OSA on CPAP 07/23/2017   Neuropathy 07/23/2017   RLS (restless legs syndrome) 07/23/2017   Chronic diastolic (congestive) heart failure (HCC) 10/27/2013   Long term (current) use of anticoagulants 06/24/2013   S/P mitral valve repair 04/20/2013   History of atrial flutter 04/20/2013   Gallstones 03/30/2013   Palpitations 02/16/2013   Red blood cell antibody positive, compatible PRBC difficult to obtain 12/21/2012   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 '02 08/06/2012   Normal coronary arteries at cath 2006 (false positive Nuc) 08/06/2012   PVD, < 49% carotid, 50% RSCA 08/06/2012   Acute renal insufficiency 08/06/2012    Hemorrhagic  shock- now stable 08/04/2012   Bradycardia- HR 58 NSR on admission 08/04/12 08/04/2012    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 [facility])    being followed at [facility]   Aneurysm of splenic artery (HCC) 2014   s/p coiling 12/16/12 - [facility]   Atrial flutter (HCC)    BPH (benign prostatic hyperplasia)    Cancer (HCC)    melanoma on lower right back and left chest - surgically removed and cleared   Difficult intubation    Dysrhythmia    H/O agent Orange exposure    Headache    migraine- not current   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 Earl Gala told him he had improved   Paroxysmal atrial fibrillation (HCC) 01/02/2021   Pneumonia    Thoracic ascending aortic aneurysm (HCC)    4.5 cm 03/2018 CT. 4.4 cm on echo 10/2021 and on CTA 03/2021   Thyroid cancer [facility])     Past Surgical History:  Procedure Laterality Date   ABDOMINAL ANGIOGRAM  08/05/2012   aneurysm repair     ATRIAL FIBRILLATION ABLATION N/A 10/08/2022   Procedure: ATRIAL FIBRILLATION ABLATION;  Surgeon: Regan Lemming, MD;  Location: [facility] INVASIVE CV LAB;  Service: Cardiovascular;  Laterality: N/A;   CARDIOVERSION  02/12/2012   Procedure: CARDIOVERSION;  Surgeon: [name], MD;  Location: [facility] ENDOSCOPY;  Service: Cardiovascular;  Laterality: N/A;   CARDIOVERSION N/A 06/22/2013    Procedure: CARDIOVERSION;  Surgeon: Lars Masson, MD;  Location: [facility] ENDOSCOPY;  Service: Cardiovascular;  Laterality: N/A;   CARDIOVERSION N/A 06/19/2021   Procedure: CARDIOVERSION;  Surgeon: Christell Constant, MD;  Location: [facility] ENDOSCOPY;  Service: Cardiovascular;  Laterality: N/A;   CATARACT EXTRACTION Bilateral 2018   with lens implant   CHOLECYSTECTOMY     COLONOSCOPY     ESOPHAGOGASTRODUODENOSCOPY     implantable loop recorder placement  03/29/2021   Medtronic Reveal Linq model LNQ 22 [id number]) implantable loop recorder   IR IMAGING GUIDED PORT INSERTION  05/26/2018   IR REMOVAL TUN ACCESS W/ PORT W/O FL MOD SED  02/05/2021   LYMPH NODE BIOPSY Left 04/27/2018   Procedure: EXCISIONAL BIOPSY OF LEFT CERVICAL LYMPH NODE;  Surgeon: Christia Reading, MD;  Location: [facility] OR;  Service: ENT;  Laterality: Left;   LYMPH NODE BIOPSY Left 11/23/2018   Procedure: LEFT CERVICAL LYMPH NODE OPEN BIOPSY;  Surgeon: Christia Reading, MD;  Location: [facility] OR;  Service: ENT;  Laterality: Left;   MENISCUS REPAIR Right 2009   MITRAL VALVE REPAIR  2002   [facility]   NM MYOVIEW LTD  07/22/2006   no ischemia   RADICAL NECK DISSECTION Left 01/15/2019   Procedure: LEFT NECK DISSECTION;  Surgeon: Christia Reading, MD;  Location: [facility] OR;  Service: ENT;  Laterality:  Left;   RIGHT HEART CATH  06/19/2004   normal right heart dynamics. EF 50%   TEE WITHOUT CARDIOVERSION  02/12/2012   Procedure: TRANSESOPHAGEAL ECHOCARDIOGRAM (TEE);  Surgeon: [name], MD;  Location: [facility] ENDOSCOPY;  Service: Cardiovascular;  Laterality: N/A;   TEE WITHOUT CARDIOVERSION N/A 06/22/2013   Procedure: TRANSESOPHAGEAL ECHOCARDIOGRAM (TEE);  Surgeon: Lars Masson, MD;  Location: [facility] ENDOSCOPY;  Service: Cardiovascular;  Laterality: N/A;   THYROIDECTOMY N/A 01/15/2019   Procedure: TOTAL THYROIDECTOMY;  Surgeon: Christia Reading, MD;  Location: [facility] OR;  Service: ENT;  Laterality: N/A;    Prior to Admission medications   Medication Sig  Start Date End Date Taking? Authorizing Provider  acetaminophen (TYLENOL) 500 MG tablet Take 1,000 mg by mouth daily as needed for moderate pain.    [provider]  alfuzosin (UROXATRAL) 10 MG 24 hr tablet Take 10 mg by mouth daily.    [provider]  apixaban (ELIQUIS) 5 MG TABS tablet Take 1 tablet (5 mg total) by mouth 2 (two) times daily. 05/28/21   Fenton, Clint R, PA  buPROPion (WELLBUTRIN XL) 150 MG 24 hr tablet Take 1 tablet (150 mg total) by mouth daily. 05/22/21   Dohmeier, Porfirio Mylar, MD  Cholecalciferol (VITAMIN D) 2000 units tablet Take 2,000 Units by mouth at bedtime.     [provider]  COVID-19 mRNA vaccine [id number] (COMIRNATY) syringe Inject into the muscle. 08/23/22   Judyann Munson, MD  ezetimibe (ZETIA) 10 MG tablet Take 1 tablet (10 mg total) by mouth every morning. 03/07/21   Meriam Sprague, MD  finasteride (PROSCAR) 5 MG tablet Take 5 mg by mouth daily. 09/23/14   [provider]  levothyroxine (SYNTHROID) 150 MCG tablet Take 150 mcg by mouth daily before breakfast.    [provider]  metoprolol succinate (TOPROL-XL) 25 MG 24 hr tablet Take 12.5 mg by mouth daily. 06/11/16   [provider]  Polyethyl Glycol-Propyl Glycol (SYSTANE HYDRATION PF OP) Place 1 drop into both eyes 3 (three) times daily as needed (Dry eyes).    [provider]  rosuvastatin (CRESTOR) 5 MG tablet Take 2 tablets by mouth Monday, wed, and fri 10/24/22   [provider]    Allergies  Allergen Reactions   Rosuvastatin Other (See Comments)    Muscle/chest pain  Other Reaction(s): myalgias   Aspirin     Has been instructed not to take any blood thinners even aspirin due to history of several aneurysms  Other Reaction(s): Unknown   Levaquin [Levofloxacin In D5w]     tendonitis   Levofloxacin     Other Reaction(s): Itching, tendonitis   Pravastatin     Muscle aches   Simvastatin     Chest pain  Other Reaction(s): myalgias    Allopurinol Rash   Ampicillin Rash    Did it involve swelling of the face/tongue/throat, SOB, or low BP? No  Did it involve sudden or severe rash/hives, skin peeling, or any reaction on the inside of your mouth or nose? Yes  Did you need to seek medical attention at a hospital or doctor's office? Yes  When did it last happen?      50+ years  If all above answers are "NO", may proceed with cephalosporin use.  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  Tobacco Use   Smoking status: Never   Smokeless tobacco: Never   Tobacco comments:    Never smoke 04/10/22  Vaping Use   Vaping status: Never Used  Substance and Sexual Activity   Alcohol use: Not Currently    Comment: social   Drug use: No   Sexual activity: Not on file  Other Topics Concern   Not on file  Social History Narrative   Lives in [place], Retired   Social Drivers of Corporate investment banker Strain: Not on [employer] Insecurity: Not on file  Transportation Needs: Not on file  Physical Activity: Not on file  Stress: Not on file  Social Connections: Not on file  Intimate Partner Violence: Not on file    Tobacco Use: Low Risk  (05/26/2023)   Patient History    Smoking Tobacco Use: Never    Smokeless Tobacco Use: Never    Passive Exposure: Not on file   Social History   Substance and Sexual Activity  Alcohol Use Not Currently   Comment: social    Family History  Problem Relation Age of Onset   Parkinson's disease Mother 39   Heart failure Father 74   Cancer Maternal Grandmother    Heart attack Maternal Grandfather    Heart attack Paternal Grandmother    Heart attack  Paternal Grandfather     ROS  Objective:  Physical Exam: - Well-developed male, alert, oriented, no apparent distress.  - Evaluation of his right knee shows a trace effusion.  - His range of motion is approximately 0 to 130 degrees.  - There is crepitus on range of motion.  - He is tender medially.  - There is no lateral tenderness or instability.  - His gait pattern is antalgic on the right.    IMAGING:  Radiographs from a few months ago demonstrate bone-on-bone arthritis in the medial compartment with near bone-on-bone patellofemoral in the right knee.  Assessment/Plan:  End stage arthritis, right knee   The patient history, physical examination, clinical judgment of the provider and imaging studies are consistent with end stage degenerative joint disease of the right knee and total knee arthroplasty is deemed medically necessary. The treatment options including medical management, injection therapy arthroscopy and arthroplasty were discussed at length. The risks and benefits of total knee arthroplasty were presented and reviewed. The risks due to aseptic loosening, infection, stiffness, patella tracking problems, thromboembolic complications and other imponderables were discussed. The patient acknowledged the explanation, agreed to proceed with the plan and consent was signed. Patient is being admitted for inpatient treatment for surgery, pain control, PT, OT, prophylactic antibiotics, VTE prophylaxis, progressive ambulation and ADLs and discharge planning. The patient is planning to be discharged home.   Patient's anticipated LOS is less than 2 midnights, meeting these requirements: - Younger than 63 - Lives within 1 hour of care - Has a competent adult at home to recover with post-op recover - NO history of  - Chronic pain requiring opiods  - Diabetes  - Heart failure  - Heart attack  - Stroke  - DVT/VTE  - Respiratory Failure/COPD  - Renal failure  - Anemia  - Advanced  Liver disease   Risks and benefits of the surgery were discussed with the patient and Dr. Lequita Halt at their previous office  visit, and the patient has elected to move forward with the aforementioned surgery. Post-operative care plans were discussed with the patient today and all patient questions were answered.   Therapy Plans: [facility] Disposition: Home to [facility] Planned DVT Prophylaxis: Eliquis 5mg  BID (hx A fib) DME Needed: None PCP: Eleanora Neighbor, MD (clearance recieved) Cardiologist: Canary Brim, NP (clearance received) TXA: IV Allergies: NICKEL, allopurinol (rash), aspirin (aneurysms), ampicillin/penicillin (rash), levofloxacin (tendonitis) Anesthesia Concerns: None BMI: 29.9 Last HgbA1c: Not diabetic Pharmacy: [facility] [address]  Other: -NICKEL ALLERGY  -Stop Eliquis 3 days prior to surgery, per cardiology -PT order to [facility]   Instructed patient on which medications to discontinue 5 days prior to surgery. Will follow-up in office with Dr. Lequita Halt 2 weeks post-op.  - Patient was instructed on what medications to stop prior to surgery. - Follow-up visit in 2 weeks with Dr. Lequita Halt - Begin physical therapy following surgery - Pre-operative lab work as pre-surgical testing - Prescriptions will be provided in hospital at time of discharge  Weston Brass, PA-C Orthopedic Surgery [facility] [place]

## 2023-06-05 DIAGNOSIS — I4891 Unspecified atrial fibrillation: Secondary | ICD-10-CM | POA: Diagnosis not present

## 2023-06-05 DIAGNOSIS — G4733 Obstructive sleep apnea (adult) (pediatric): Secondary | ICD-10-CM | POA: Diagnosis not present

## 2023-06-05 DIAGNOSIS — Z85820 Personal history of malignant melanoma of skin: Secondary | ICD-10-CM | POA: Diagnosis not present

## 2023-06-05 DIAGNOSIS — E559 Vitamin D deficiency, unspecified: Secondary | ICD-10-CM | POA: Diagnosis not present

## 2023-06-05 DIAGNOSIS — C73 Malignant neoplasm of thyroid gland: Secondary | ICD-10-CM | POA: Diagnosis not present

## 2023-06-05 DIAGNOSIS — E890 Postprocedural hypothyroidism: Secondary | ICD-10-CM | POA: Diagnosis not present

## 2023-06-05 DIAGNOSIS — E7800 Pure hypercholesterolemia, unspecified: Secondary | ICD-10-CM | POA: Diagnosis not present

## 2023-06-06 NOTE — Progress Notes (Addendum)
 COVID Vaccine Completed: yes  Date of COVID positive in last 90 days:  PCP - Brayton El, MD office notes in media tab Cardiologist - Loman Brooklyn, MD  Medical clearance Dr. Orson Aloe in media tab stated 06/02/23 Cardiac clearance by Canary Brim, NP 05/26/23 in Epic  Chest x-ray - 01/03/23 Epic EKG - 05/26/23 Epic Stress Test - 2008 ECHO - 11/08/21 Epic Cardiac Cath - yes  Pacemaker/ICD device last checked: loop recorder- 05/05/23 Epic Spinal Cord Stimulator: n/a  Bowel Prep - no  Sleep Study - yes CPAP - no  Fasting Blood Sugar - n/a Checks Blood Sugar _____ times a day  Last dose of GLP1 agonist-  N/A GLP1 instructions:  Hold 7 days before surgery    Last dose of SGLT-2 inhibitors-  N/A SGLT-2 instructions:  Hold 3 days before surgery    Blood Thinner Instructions: Eliquis, hold 3 days Aspirin Instructions: Last Dose: 06/12/23 2100  Activity level: Can go up a flight of stairs and perform activities of daily living without stopping and without symptoms of chest pain or shortness of breath.   Anesthesia review: CHF, a fib, OSA, coronary atherosclerosis, mitral valve repair non hodgkin lymphoma   Patient denies shortness of breath, fever, cough and chest pain at PAT appointment  Patient verbalized understanding of instructions that were given to them at the PAT appointment. Patient was also instructed that they will need to review over the PAT instructions again at home before surgery.

## 2023-06-06 NOTE — Patient Instructions (Addendum)
 SURGICAL WAITING ROOM VISITATION  Patients having surgery or a procedure may have no more than 2 support people in the waiting area - these visitors may rotate.    Children under the age of 40 must have an adult with them who is not the patient.  Due to an increase in RSV and influenza rates and associated hospitalizations, children ages 12 and under may not visit patients in [facility] hospitals.  Visitors with respiratory illnesses are discouraged from visiting and should remain at home.  If the patient needs to stay at the hospital during part of their recovery, the visitor guidelines for inpatient rooms apply. Pre-op nurse will coordinate an appropriate time for 1 support person to accompany patient in pre-op.  This support person may not rotate.    Please refer to the [facility] website for the visitor guidelines for Inpatients (after your surgery is over and you are in a regular room).    Your procedure is scheduled on: 06/16/23   Report to [facility] Main Entrance    Report to admitting at 5:15 AM   Call this number if you have problems the morning of surgery [phone]   Do not eat food :After Midnight.   After Midnight you may have the following liquids until 4:15 AM DAY OF SURGERY  Water Non-Citrus Juices (without pulp, NO RED-Apple, White grape, White cranberry) Black Coffee (NO MILK/CREAM OR CREAMERS, sugar ok)  Clear Tea (NO MILK/CREAM OR CREAMERS, sugar ok) regular and decaf                             Plain Jell-O (NO RED)                                           Fruit ices (not with fruit pulp, NO RED)                                     Popsicles (NO RED)                                                               Sports drinks like Gatorade (NO RED)                 The day of surgery:  Drink ONE (1) Pre-Surgery Clear Ensure at 4:15 AM the morning of surgery. Drink in one sitting. Do not sip.  This drink was given to you during your hospital   pre-op appointment visit. Nothing else to drink after completing the  Pre-Surgery Clear Ensure.          If you have questions, please contact your surgeon's office.   FOLLOW BOWEL PREP AND ANY ADDITIONAL PRE OP INSTRUCTIONS YOU RECEIVED FROM YOUR SURGEON'S OFFICE!!!     Oral Hygiene is also important to reduce your risk of infection.                                    Remember -  BRUSH YOUR TEETH THE MORNING OF SURGERY WITH YOUR REGULAR TOOTHPASTE  DENTURES WILL BE REMOVED PRIOR TO SURGERY PLEASE DO NOT APPLY "Poly grip" OR ADHESIVES!!!   Stop all vitamins and herbal supplements 7 days before surgery.   Take these medicines the morning of surgery with A SIP OF WATER: Tylenol, Bupropion, Levothyroxine, Metoprolol, Rosuvastatin                               You may not have any metal on your body including jewelry, and body piercing             Do not wear lotions, powders, cologne, or deodorant              Men may shave face and neck.   Do not bring valuables to the hospital. [facility] IS NOT             RESPONSIBLE   FOR VALUABLES.   Contacts, glasses, dentures or bridgework may not be worn into surgery.   Bring small overnight bag day of surgery.   DO NOT BRING YOUR HOME MEDICATIONS TO THE HOSPITAL. PHARMACY WILL DISPENSE MEDICATIONS LISTED ON YOUR MEDICATION LIST TO YOU DURING YOUR ADMISSION IN THE HOSPITAL!              Please read over the following fact sheets you were given: IF YOU HAVE QUESTIONS ABOUT YOUR PRE-OP INSTRUCTIONS PLEASE CALL [name]    If you received a COVID test during your pre-op visit  it is requested that you wear a mask when out in public, stay away from anyone that may not be feeling well and notify your surgeon if you develop symptoms. If you test positive for Covid or have been in contact with anyone that has tested positive in the last 10 days please notify you surgeon.      Pre-operative 5 CHG Bath Instructions   You can  play a key role in reducing the risk of infection after surgery. Your skin needs to be as free of germs as possible. You can reduce the number of germs on your skin by washing with CHG (chlorhexidine gluconate) soap before surgery. CHG is an antiseptic soap that kills germs and continues to kill germs even after washing.   DO NOT use if you have an allergy to chlorhexidine/CHG or antibacterial soaps. If your skin becomes reddened or irritated, stop using the CHG and notify one of our RNs at [phone].   Please shower with the CHG soap starting 4 days before surgery using the following schedule:     Please keep in mind the following:  DO NOT shave, including legs and underarms, starting the day of your first shower.   You may shave your face at any point before/day of surgery.  Place clean sheets on your bed the day you start using CHG soap. Use a clean washcloth (not used since being washed) for each shower. DO NOT sleep with pets once you start using the CHG.   CHG Shower Instructions:  If you choose to wash your hair and private area, wash first with your normal shampoo/soap.  After you use shampoo/soap, rinse your hair and body thoroughly to remove shampoo/soap residue.  Turn the water OFF and apply about 3 tablespoons (45 ml) of CHG soap to a CLEAN washcloth.  Apply CHG soap ONLY FROM YOUR NECK DOWN TO YOUR TOES (washing for 3-5 minutes)  DO NOT  use CHG soap on face, private areas, open wounds, or sores.  Pay special attention to the area where your surgery is being performed.  If you are having back surgery, having someone wash your back for you may be helpful. Wait 2 minutes after CHG soap is applied, then you may rinse off the CHG soap.  Pat dry with a clean towel  Put on clean clothes/pajamas   If you choose to wear lotion, please use ONLY the CHG-compatible lotions on the back of this paper.     Additional instructions for the day of surgery: DO NOT APPLY any lotions,  deodorants, cologne, or perfumes.   Put on clean/comfortable clothes.  Brush your teeth.  Ask your nurse before applying any prescription medications to the skin.      CHG Compatible Lotions   Aveeno Moisturizing lotion  Cetaphil Moisturizing Cream  Cetaphil Moisturizing Lotion  Clairol Herbal Essence Moisturizing Lotion, Dry Skin  Clairol Herbal Essence Moisturizing Lotion, Extra Dry Skin  Clairol Herbal Essence Moisturizing Lotion, Normal Skin  Curel Age Defying Therapeutic Moisturizing Lotion with Alpha Hydroxy  Curel Extreme Care Body Lotion  Curel Soothing Hands Moisturizing Hand Lotion  Curel Therapeutic Moisturizing Cream, Fragrance-Free  Curel Therapeutic Moisturizing Lotion, Fragrance-Free  Curel Therapeutic Moisturizing Lotion, Original Formula  Eucerin Daily Replenishing Lotion  Eucerin Dry Skin Therapy Plus Alpha Hydroxy Crme  Eucerin Dry Skin Therapy Plus Alpha Hydroxy Lotion  Eucerin Original Crme  Eucerin Original Lotion  Eucerin Plus Crme Eucerin Plus Lotion  Eucerin TriLipid Replenishing Lotion  Keri Anti-Bacterial Hand Lotion  Keri Deep Conditioning Original Lotion Dry Skin Formula Softly Scented  Keri Deep Conditioning Original Lotion, Fragrance Free Sensitive Skin Formula  Keri Lotion Fast Absorbing Fragrance Free Sensitive Skin Formula  Keri Lotion Fast Absorbing Softly Scented Dry Skin Formula  Keri Original Lotion  Keri Skin Renewal Lotion Keri Silky Smooth Lotion  Keri Silky Smooth Sensitive Skin Lotion  Nivea Body Creamy Conditioning Oil  Nivea Body Extra Enriched Lotion  Nivea Body Original Lotion  Nivea Body Sheer Moisturizing Lotion Nivea Crme  Nivea Skin Firming Lotion  NutraDerm 30 Skin Lotion  NutraDerm Skin Lotion  NutraDerm Therapeutic Skin Cream  NutraDerm Therapeutic Skin Lotion  ProShield Protective Hand Cream  Provon moisturizing lotion   Incentive Spirometer  An incentive spirometer is a tool that can help keep your lungs  clear and active. This tool measures how well you are filling your lungs with each breath. Taking long deep breaths may help reverse or decrease the chance of developing breathing (pulmonary) problems (especially infection) following: A long period of time when you are unable to move or be active. BEFORE THE PROCEDURE  If the spirometer includes an indicator to show your best effort, your nurse or respiratory therapist will set it to a desired goal. If possible, sit up straight or lean slightly forward. Try not to slouch. Hold the incentive spirometer in an upright position. INSTRUCTIONS FOR USE  Sit on the edge of your bed if possible, or sit up as far as you can in bed or on a chair. Hold the incentive spirometer in an upright position. Breathe out normally. Place the mouthpiece in your mouth and seal your lips tightly around it. Breathe in slowly and as deeply as possible, raising the piston or the ball toward the top of the column. Hold your breath for 3-5 seconds or for as long as possible. Allow the piston or ball to fall to the bottom of the column. Remove  the mouthpiece from your mouth and breathe out normally. Rest for a few seconds and repeat Steps 1 through 7 at least 10 times every 1-2 hours when you are awake. Take your time and take a few normal breaths between deep breaths. The spirometer may include an indicator to show your best effort. Use the indicator as a goal to work toward during each repetition. After each set of 10 deep breaths, practice coughing to be sure your lungs are clear. If you have an incision (the cut made at the time of surgery), support your incision when coughing by placing a pillow or rolled up towels firmly against it. Once you are able to get out of bed, walk around indoors and cough well. You may stop using the incentive spirometer when instructed by your caregiver.  RISKS AND COMPLICATIONS Take your time so you do not get dizzy or light-headed. If you  are in pain, you may need to take or ask for pain medication before doing incentive spirometry. It is harder to take a deep breath if you are having pain. AFTER USE Rest and breathe slowly and easily. It can be helpful to keep track of a log of your progress. Your caregiver can provide you with a simple table to help with this. If you are using the spirometer at home, follow these instructions: SEEK MEDICAL CARE IF:  You are having difficultly using the spirometer. You have trouble using the spirometer as often as instructed. Your pain medication is not giving enough relief while using the spirometer. You develop fever of 100.5 F (38.1 C) or higher. SEEK IMMEDIATE MEDICAL CARE IF:  You cough up bloody sputum that had not been present before. You develop fever of 102 F (38.9 C) or greater. You develop worsening pain at or near the incision site. MAKE SURE YOU:  Understand these instructions. Will watch your condition. Will get help right away if you are not doing well or get worse. Document Released: 07/22/2006 Document Revised: 06/03/2011 Document Reviewed: 09/22/2006 [facility] Patient Information 2014 [place], [place].   ________________________________________________________________________

## 2023-06-09 ENCOUNTER — Encounter (HOSPITAL_COMMUNITY): Payer: Self-pay

## 2023-06-09 ENCOUNTER — Encounter (HOSPITAL_COMMUNITY)
Admission: RE | Admit: 2023-06-09 | Discharge: 2023-06-09 | Disposition: A | Discharge status: Home / Self Care | Source: Ambulatory Visit | Attending: Orthopedic Surgery | Admitting: Orthopedic Surgery

## 2023-06-09 ENCOUNTER — Ambulatory Visit: Payer: Medicare Other

## 2023-06-09 ENCOUNTER — Other Ambulatory Visit: Payer: Self-pay

## 2023-06-09 VITALS — BP 123/80 | HR 71 | Temp 97.7°F | Resp 14 | Ht 73.0 in | Wt 211.0 lb

## 2023-06-09 DIAGNOSIS — I4892 Unspecified atrial flutter: Secondary | ICD-10-CM | POA: Diagnosis not present

## 2023-06-09 DIAGNOSIS — M1711 Unilateral primary osteoarthritis, right knee: Secondary | ICD-10-CM | POA: Insufficient documentation

## 2023-06-09 DIAGNOSIS — H40013 Open angle with borderline findings, low risk, bilateral: Secondary | ICD-10-CM | POA: Diagnosis not present

## 2023-06-09 DIAGNOSIS — I480 Paroxysmal atrial fibrillation: Secondary | ICD-10-CM | POA: Diagnosis not present

## 2023-06-09 DIAGNOSIS — Z01818 Encounter for other preprocedural examination: Secondary | ICD-10-CM

## 2023-06-09 DIAGNOSIS — G4733 Obstructive sleep apnea (adult) (pediatric): Secondary | ICD-10-CM | POA: Diagnosis not present

## 2023-06-09 DIAGNOSIS — H43813 Vitreous degeneration, bilateral: Secondary | ICD-10-CM | POA: Diagnosis not present

## 2023-06-09 DIAGNOSIS — I059 Rheumatic mitral valve disease, unspecified: Secondary | ICD-10-CM | POA: Insufficient documentation

## 2023-06-09 DIAGNOSIS — Z961 Presence of intraocular lens: Secondary | ICD-10-CM | POA: Diagnosis not present

## 2023-06-09 DIAGNOSIS — Z01812 Encounter for preprocedural laboratory examination: Secondary | ICD-10-CM | POA: Insufficient documentation

## 2023-06-09 DIAGNOSIS — H04123 Dry eye syndrome of bilateral lacrimal glands: Secondary | ICD-10-CM | POA: Diagnosis not present

## 2023-06-09 LAB — SURGICAL PCR SCREEN
MRSA, PCR: NEGATIVE
Staphylococcus aureus: NEGATIVE

## 2023-06-09 LAB — CBC
HCT: 40.2 % (ref 39.0–52.0)
Hemoglobin: 12.6 g/dL — ABNORMAL LOW (ref 13.0–17.0)
MCH: 30.2 pg (ref 26.0–34.0)
MCHC: 31.3 g/dL (ref 30.0–36.0)
MCV: 96.4 fL (ref 80.0–100.0)
Platelets: 209 10*3/uL (ref 150–400)
RBC: 4.17 MIL/uL — ABNORMAL LOW (ref 4.22–5.81)
RDW: 12.7 % (ref 11.5–15.5)
WBC: 6.9 10*3/uL (ref 4.0–10.5)
nRBC: 0 % (ref 0.0–0.2)

## 2023-06-09 LAB — BASIC METABOLIC PANEL
Anion gap: 6 (ref 5–15)
BUN: 26 mg/dL — ABNORMAL HIGH (ref 8–23)
CO2: 26 mmol/L (ref 22–32)
Calcium: 9 mg/dL (ref 8.9–10.3)
Chloride: 105 mmol/L (ref 98–111)
Creatinine, Ser: 1.13 mg/dL (ref 0.61–1.24)
GFR, Estimated: >60 mL/min (ref >60)
Glucose, Bld: 90 mg/dL (ref 70–99)
Potassium: 4.3 mmol/L (ref 3.5–5.1)
Sodium: 137 mmol/L (ref 135–145)

## 2023-06-09 NOTE — Progress Notes (Signed)
 Carelink Summary Report / Loop Recorder

## 2023-06-10 LAB — CUP PACEART REMOTE DEVICE CHECK
Date Time Interrogation Session: 20250316230921
Implantable Pulse Generator Implant Date: 20230105

## 2023-06-10 NOTE — Anesthesia Preprocedure Evaluation (Addendum)
 Anesthesia Evaluation  Patient identified by MRN, date of birth, ID band Patient awake    Reviewed: Allergy & Precautions, NPO status , Patient's Chart, lab work & pertinent test results, reviewed documented beta blocker date and time   History of Anesthesia Complications (+) DIFFICULT AIRWAY and history of anesthetic complications  Airway Mallampati: IV  TM Distance: >3 FB Neck ROM: Full  Mouth opening: Limited Mouth Opening  Dental  (+) Teeth Intact, Dental Advisory Given   Pulmonary sleep apnea    Pulmonary exam normal breath sounds clear to auscultation       Cardiovascular hypertension, Pt. on home beta blockers + CAD, + Peripheral Vascular Disease (abdominal aortic aneurysms s/p coiling 11/2012) and +CHF  Normal cardiovascular exam+ dysrhythmias (s/p ablation) Atrial Fibrillation + Valvular Problems/Murmurs (s/p mitral valve annuloplasty 2002) MR  Rhythm:Regular Rate:Normal  Echo 11/08/21: 1. Left ventricular ejection fraction, by estimation, is 50 to 55%. The  left ventricle has normal function. The left ventricle has no regional  wall motion abnormalities. Left ventricular diastolic function could not  be evaluated.   2. Right ventricular systolic function is normal. The right ventricular  size is normal. There is normal pulmonary artery systolic pressure.   3. Left atrial size was severely dilated.   4. Right atrial size was severely dilated.   5. The mitral valve has been repaired/replaced. No evidence of mitral  valve regurgitation. No evidence of mitral stenosis. Procedure Date: 2002.   6. The aortic valve is tricuspid. Aortic valve regurgitation is not  visualized. Aortic valve sclerosis is present, with no evidence of aortic  valve stenosis.   7. There is moderate dilatation of the aortic root, measuring 40 mm.  There is moderate dilatation of the ascending aorta, measuring 44 mm.   8. The inferior vena cava is  normal in size with greater than 50%  respiratory variability, suggesting right atrial pressure of 3 mmHg.      Neuro/Psych  Headaches  Neuromuscular disease    GI/Hepatic negative GI ROS, Neg liver ROS,,,  Endo/Other  negative endocrine ROS    Renal/GU Renal disease     Musculoskeletal negative musculoskeletal ROS (+)    Abdominal   Peds  Hematology  (+) Blood dyscrasia (Eliquis'), anemia Plt 209k   Anesthesia Other Findings   Reproductive/Obstetrics                             Anesthesia Physical Anesthesia Plan  ASA: 3  Anesthesia Plan: Spinal   Post-op Pain Management: Tylenol PO (pre-op)* and Regional block*   Induction: Intravenous  PONV Risk Score and Plan: 1 and TIVA, Dexamethasone and Ondansetron  Airway Management Planned:   Additional Equipment:   Intra-op Plan:   Post-operative Plan:   Informed Consent: I have reviewed the patients History and Physical, chart, labs and discussed the procedure including the risks, benefits and alternatives for the proposed anesthesia with the patient or authorized representative who has indicated his/her understanding and acceptance.     Dental advisory given  Plan Discussed with: CRNA  Anesthesia Plan Comments: (See PAT note 06/09/2023)       Anesthesia Quick Evaluation

## 2023-06-10 NOTE — Progress Notes (Signed)
 Anesthesia Chart Review   Case: [id number] Date/Time: 06/16/23 0700   Procedure: ARTHROPLASTY, KNEE, TOTAL (Right: Knee)   Anesthesia type: Choice   Pre-op diagnosis: Right Knee Osteoarthritis   Location: [facility] 09 / [facility]   Surgeons: Ollen Gross, MD       DISCUSSION:83 y.o. 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 Ollen Gross.   Pt s/p unilateral laryngoplasty 2021. Difficult intubation in history, no details found.  Per last anesthesia note 10/08/2023 with no difficulty documented.   Pt last seen by cardiology 05/26/2023. Per notes pt is active participating in water aerobics without sx.  "Mr. Cyran's perioperative risk of a major cardiac event is 0.9% according to the Revised Cardiac Risk Index (RCRI).  Therefore, he is at low risk for perioperative complications.      Recommendations: According to ACC/AHA guidelines, no further cardiovascular testing needed.  The patient may proceed to surgery at acceptable risk.     Antiplatelet and/or Anticoagulation Recommendations:   Eliquis (Apixaban) can be held for 3 days prior to surgery.  Please resume post op when felt to be safe.  "  Last dose of Eliquis 06/12/2023.  VS: BP 123/80   Pulse 71   Temp 36.5 C (Oral)   Resp 14   Ht 6\' 1"  (1.854 m)   Wt 95.7 kg   SpO2 100%   BMI 27.84 kg/m   PROVIDERS: [name], MD is PCP   Cardiologist - Loman Brooklyn, MD   LABS: Labs reviewed: Acceptable for surgery. (all labs ordered are listed, but only abnormal results are displayed)  Labs Reviewed  BASIC METABOLIC PANEL - Abnormal; Notable for the following components:      Result Value   BUN 26 (*)    All  other components within normal limits  CBC - Abnormal; Notable for the following components:   RBC 4.17 (*)    Hemoglobin 12.6 (*)    All other components within normal limits  SURGICAL PCR SCREEN     IMAGES:   EKG:   CV: Echo 11/08/2021 1. Left ventricular ejection fraction, by estimation, is 50 to 55%. The  left ventricle has normal function. The left ventricle has no regional  wall motion abnormalities. Left ventricular diastolic function could not  be evaluated.   2. Right ventricular systolic function is normal. The right ventricular  size is normal. There is normal pulmonary artery systolic pressure.   3. Left atrial size was severely dilated.   4. Right atrial size was severely dilated.   5. The mitral valve has been repaired/replaced. No evidence of mitral  valve regurgitation. No evidence of mitral stenosis. Procedure Date: 2002.   6. The aortic valve is tricuspid. Aortic valve regurgitation is not  visualized. Aortic valve sclerosis is present, with no evidence of aortic  valve stenosis.   7. There is moderate dilatation of the aortic root, measuring 40 mm.  There is moderate dilatation of the ascending aorta, measuring 44 mm.   8. The inferior vena cava is normal in size with greater than 50%  respiratory variability, suggesting right atrial pressure of 3 mmHg.   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 [facility])    being followed at [facility]   Aneurysm of splenic artery (HCC) 2014   s/p coiling 12/16/12 - [facility]   Atrial flutter (HCC)    BPH (benign prostatic hyperplasia)    Cancer (HCC)    melanoma on lower right back and left chest -  surgically removed and cleared   Difficult intubation    Dysrhythmia    H/O agent Orange exposure    Headache    migraine- not current   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 Earl Gala told him he had improved   Paroxysmal atrial fibrillation (HCC) 01/02/2021   Pneumonia    Thoracic ascending aortic aneurysm (HCC)    4.5 cm 03/2018 CT. 4.4 cm on echo 10/2021 and on CTA 03/2021   Thyroid cancer [facility])     Past Surgical History:  Procedure Laterality Date   ABDOMINAL ANGIOGRAM  08/05/2012   aneurysm repair     ATRIAL FIBRILLATION ABLATION N/A 10/08/2022   Procedure: ATRIAL FIBRILLATION ABLATION;  Surgeon: Regan Lemming, MD;  Location: [facility] INVASIVE CV LAB;  Service: Cardiovascular;  Laterality: N/A;   CARDIOVERSION  02/12/2012   Procedure: CARDIOVERSION;  Surgeon: [name], MD;  Location: [facility] ENDOSCOPY;  Service: Cardiovascular;  Laterality: N/A;   CARDIOVERSION N/A 06/22/2013   Procedure: CARDIOVERSION;  Surgeon: Lars Masson, MD;  Location: [facility] ENDOSCOPY;  Service: Cardiovascular;  Laterality: N/A;   CARDIOVERSION N/A 06/19/2021   Procedure: CARDIOVERSION;  Surgeon: Christell Constant, MD;  Location: [facility] ENDOSCOPY;  Service: Cardiovascular;  Laterality: N/A;   CATARACT EXTRACTION Bilateral 2018   with lens implant   CHOLECYSTECTOMY     COLONOSCOPY     ESOPHAGOGASTRODUODENOSCOPY     implantable loop recorder placement  03/29/2021   Medtronic Reveal Linq model LNQ 22 [id number]) implantable loop recorder   IR IMAGING GUIDED PORT INSERTION  05/26/2018   IR REMOVAL TUN ACCESS W/ PORT W/O FL MOD SED  02/05/2021   LYMPH NODE BIOPSY Left 04/27/2018   Procedure:  EXCISIONAL BIOPSY OF LEFT CERVICAL LYMPH NODE;  Surgeon: Christia Reading, MD;  Location: [facility] OR;  Service: ENT;  Laterality: Left;   LYMPH NODE BIOPSY Left 11/23/2018   Procedure: LEFT CERVICAL LYMPH NODE OPEN BIOPSY;  Surgeon: Christia Reading, MD;  [facility] OR;  Service: ENT;  Laterality: Left;   MENISCUS REPAIR Right 2009   MITRAL VALVE REPAIR  2002   [facility]   NM MYOVIEW LTD  07/22/2006   no ischemia   RADICAL NECK DISSECTION Left 01/15/2019   Procedure: LEFT NECK DISSECTION;  Surgeon: Christia Reading, MD;  Location: [facility] OR;  Service: ENT;  Laterality: Left;   RIGHT HEART CATH  06/19/2004   normal right heart dynamics. EF 50%   TEE WITHOUT CARDIOVERSION  02/12/2012   Procedure: TRANSESOPHAGEAL ECHOCARDIOGRAM (TEE);  Surgeon: Lisette Abu.  Hilty, MD;  Location: [facility] ENDOSCOPY;  Service: Cardiovascular;  Laterality: N/A;   TEE WITHOUT CARDIOVERSION N/A 06/22/2013   Procedure: TRANSESOPHAGEAL ECHOCARDIOGRAM (TEE);  Surgeon: Lars Masson, MD;  Location: [facility] ENDOSCOPY;  Service: Cardiovascular;  Laterality: N/A;   THYROIDECTOMY N/A 01/15/2019   Procedure: TOTAL THYROIDECTOMY;  Surgeon: Christia Reading, MD;  Location: [facility] OR;  Service: ENT;  Laterality: N/A;    MEDICATIONS:  acetaminophen (TYLENOL) 500 MG tablet   alfuzosin (UROXATRAL) 10 MG 24 hr tablet   apixaban (ELIQUIS) 5 MG TABS tablet   buPROPion (WELLBUTRIN XL) 150 MG 24 hr tablet   Cholecalciferol (VITAMIN D) 2000 units tablet   COVID-19 mRNA vaccine 2023-2024 (COMIRNATY) syringe   ezetimibe (ZETIA) 10 MG tablet   finasteride (PROSCAR) 5 MG tablet   levothyroxine (SYNTHROID) 150 MCG tablet   metoprolol succinate (TOPROL-XL) 25 MG 24 hr tablet   Polyethyl Glycol-Propyl Glycol (SYSTANE HYDRATION PF OP)   rosuvastatin (CRESTOR) 5 MG tablet   sodium chloride (OCEAN) 0.65 % SOLN nasal spray   No current facility-administered medications for this encounter.     Jodell Cipro Ward, PA-C [facility] Pre-Surgical Testing [phone]

## 2023-06-16 ENCOUNTER — Ambulatory Visit (HOSPITAL_COMMUNITY): Payer: Self-pay | Admitting: Physician Assistant

## 2023-06-16 ENCOUNTER — Encounter (HOSPITAL_COMMUNITY): Payer: Self-pay | Admitting: Orthopedic Surgery

## 2023-06-16 ENCOUNTER — Observation Stay (HOSPITAL_COMMUNITY)
Admission: RE | Admit: 2023-06-16 | Discharge: 2023-06-17 | Disposition: A | Discharge status: Skilled Nursing Facility | Payer: Medicare Other | Attending: Orthopedic Surgery | Admitting: Orthopedic Surgery

## 2023-06-16 ENCOUNTER — Other Ambulatory Visit: Payer: Self-pay

## 2023-06-16 ENCOUNTER — Encounter (HOSPITAL_COMMUNITY)
Admission: RE | Disposition: A | Discharge status: Skilled Nursing Facility | Payer: Self-pay | Source: Home / Self Care | Attending: Orthopedic Surgery

## 2023-06-16 ENCOUNTER — Ambulatory Visit (HOSPITAL_COMMUNITY): Payer: Self-pay | Admitting: Anesthesiology

## 2023-06-16 DIAGNOSIS — I2510 Atherosclerotic heart disease of native coronary artery without angina pectoris: Secondary | ICD-10-CM | POA: Diagnosis not present

## 2023-06-16 DIAGNOSIS — I10 Essential (primary) hypertension: Secondary | ICD-10-CM | POA: Diagnosis not present

## 2023-06-16 DIAGNOSIS — I110 Hypertensive heart disease with heart failure: Secondary | ICD-10-CM | POA: Diagnosis not present

## 2023-06-16 DIAGNOSIS — Z8572 Personal history of non-Hodgkin lymphomas: Secondary | ICD-10-CM | POA: Diagnosis not present

## 2023-06-16 DIAGNOSIS — Z79899 Other long term (current) drug therapy: Secondary | ICD-10-CM | POA: Insufficient documentation

## 2023-06-16 DIAGNOSIS — I480 Paroxysmal atrial fibrillation: Secondary | ICD-10-CM | POA: Diagnosis not present

## 2023-06-16 DIAGNOSIS — Z85828 Personal history of other malignant neoplasm of skin: Secondary | ICD-10-CM | POA: Diagnosis not present

## 2023-06-16 DIAGNOSIS — Z85850 Personal history of malignant neoplasm of thyroid: Secondary | ICD-10-CM | POA: Diagnosis not present

## 2023-06-16 DIAGNOSIS — I5032 Chronic diastolic (congestive) heart failure: Secondary | ICD-10-CM

## 2023-06-16 DIAGNOSIS — M179 Osteoarthritis of knee, unspecified: Principal | ICD-10-CM

## 2023-06-16 DIAGNOSIS — M1711 Unilateral primary osteoarthritis, right knee: Secondary | ICD-10-CM | POA: Diagnosis not present

## 2023-06-16 DIAGNOSIS — Z7901 Long term (current) use of anticoagulants: Secondary | ICD-10-CM | POA: Insufficient documentation

## 2023-06-16 DIAGNOSIS — G8918 Other acute postprocedural pain: Secondary | ICD-10-CM | POA: Diagnosis not present

## 2023-06-16 HISTORY — PX: TOTAL KNEE ARTHROPLASTY: SHX125

## 2023-06-16 SURGERY — ARTHROPLASTY, KNEE, TOTAL
Anesthesia: Spinal | Site: Knee | Laterality: Right

## 2023-06-16 MED ORDER — ONDANSETRON HCL 4 MG/2ML IJ SOLN
INTRAMUSCULAR | Status: DC | PRN
  Administered 2023-06-16: 4 mg via INTRAVENOUS

## 2023-06-16 MED ORDER — FINASTERIDE 5 MG PO TABS
5.0000 mg | ORAL_TABLET | Freq: Every evening | ORAL | Status: DC
  Administered 2023-06-16: 5 mg via ORAL
  Filled 2023-06-16: qty 1

## 2023-06-16 MED ORDER — MENTHOL 3 MG MT LOZG: 1.0000 | LOZENGE | OROMUCOSAL | Status: DC | PRN

## 2023-06-16 MED ORDER — ACETAMINOPHEN 10 MG/ML IV SOLN
1000.0000 mg | Freq: Four times a day (QID) | INTRAVENOUS | Status: DC
  Administered 2023-06-16: 1000 mg via INTRAVENOUS

## 2023-06-16 MED ORDER — CEFAZOLIN SODIUM-DEXTROSE 2-4 GM/100ML-% IV SOLN
2.0000 g | Freq: Four times a day (QID) | INTRAVENOUS | Status: AC
  Administered 2023-06-16 (×2): 2 g via INTRAVENOUS
  Filled 2023-06-16 (×2): qty 100

## 2023-06-16 MED ORDER — ONDANSETRON HCL 4 MG PO TABS: 4.0000 mg | ORAL_TABLET | Freq: Four times a day (QID) | ORAL | Status: DC | PRN

## 2023-06-16 MED ORDER — FENTANYL CITRATE PF 50 MCG/ML IJ SOSY
PREFILLED_SYRINGE | INTRAMUSCULAR | Status: AC
  Filled 2023-06-16: qty 2

## 2023-06-16 MED ORDER — SODIUM CHLORIDE (PF) 0.9 % IJ SOLN
INTRAMUSCULAR | Status: DC | PRN
  Administered 2023-06-16: 80 mL

## 2023-06-16 MED ORDER — PHENOL 1.4 % MT LIQD: 1.0000 | OROMUCOSAL | Status: DC | PRN

## 2023-06-16 MED ORDER — STERILE WATER FOR IRRIGATION IR SOLN
Status: DC | PRN
  Administered 2023-06-16: 1000 mL

## 2023-06-16 MED ORDER — METOCLOPRAMIDE HCL 5 MG PO TABS: 5.0000 mg | ORAL_TABLET | Freq: Three times a day (TID) | ORAL | Status: DC | PRN

## 2023-06-16 MED ORDER — DEXAMETHASONE SODIUM PHOSPHATE 10 MG/ML IJ SOLN
INTRAMUSCULAR | Status: AC
  Filled 2023-06-16: qty 1

## 2023-06-16 MED ORDER — ACETAMINOPHEN 325 MG PO TABS: 325.0000 mg | ORAL_TABLET | Freq: Four times a day (QID) | ORAL | Status: DC | PRN

## 2023-06-16 MED ORDER — CHLORHEXIDINE GLUCONATE 0.12 % MT SOLN: 15.0000 mL | Freq: Once | OROMUCOSAL | Status: DC

## 2023-06-16 MED ORDER — OXYCODONE HCL 5 MG PO TABS
10.0000 mg | ORAL_TABLET | ORAL | Status: DC | PRN
Start: 2023-06-16 — End: 2023-06-17
  Administered 2023-06-16 (×2): 10 mg via ORAL
  Filled 2023-06-16 (×4): qty 2

## 2023-06-16 MED ORDER — ACETAMINOPHEN 10 MG/ML IV SOLN
INTRAVENOUS | Status: AC
Start: 2023-06-16 — End: 2023-06-16
  Filled 2023-06-16: qty 100

## 2023-06-16 MED ORDER — CLONIDINE HCL (ANALGESIA) 100 MCG/ML EP SOLN
EPIDURAL | Status: DC | PRN
  Administered 2023-06-16: 50 ug

## 2023-06-16 MED ORDER — ROSUVASTATIN CALCIUM 10 MG PO TABS: 10.0000 mg | ORAL_TABLET | ORAL | Status: DC

## 2023-06-16 MED ORDER — FENTANYL CITRATE (PF) 100 MCG/2ML IJ SOLN
INTRAMUSCULAR | Status: AC
  Filled 2023-06-16: qty 2

## 2023-06-16 MED ORDER — TRANEXAMIC ACID-NACL 1000-0.7 MG/100ML-% IV SOLN
INTRAVENOUS | Status: AC
Start: 2023-06-16 — End: 2023-06-16
  Filled 2023-06-16: qty 100

## 2023-06-16 MED ORDER — METHOCARBAMOL 500 MG PO TABS
500.0000 mg | ORAL_TABLET | Freq: Four times a day (QID) | ORAL | Status: DC | PRN
  Administered 2023-06-16 – 2023-06-17 (×3): 500 mg via ORAL
  Filled 2023-06-16 (×3): qty 1

## 2023-06-16 MED ORDER — DOCUSATE SODIUM 100 MG PO CAPS
100.0000 mg | ORAL_CAPSULE | Freq: Two times a day (BID) | ORAL | Status: DC
  Administered 2023-06-16 – 2023-06-17 (×3): 100 mg via ORAL
  Filled 2023-06-16 (×3): qty 1

## 2023-06-16 MED ORDER — DIPHENHYDRAMINE HCL 12.5 MG/5ML PO ELIX: 12.5000 mg | ORAL_SOLUTION | ORAL | Status: DC | PRN

## 2023-06-16 MED ORDER — TRAMADOL HCL 50 MG PO TABS
50.0000 mg | ORAL_TABLET | Freq: Four times a day (QID) | ORAL | Status: DC | PRN
  Administered 2023-06-16: 100 mg via ORAL
  Filled 2023-06-16: qty 2

## 2023-06-16 MED ORDER — PROPOFOL 500 MG/50ML IV EMUL
INTRAVENOUS | Status: DC | PRN
  Administered 2023-06-16: 60 ug/kg/min via INTRAVENOUS

## 2023-06-16 MED ORDER — PROPOFOL 1000 MG/100ML IV EMUL
INTRAVENOUS | Status: AC
  Filled 2023-06-16: qty 100

## 2023-06-16 MED ORDER — LACTATED RINGERS IV SOLN: INTRAVENOUS | Status: DC

## 2023-06-16 MED ORDER — SODIUM CHLORIDE 0.9 % IV SOLN: INTRAVENOUS | Status: DC

## 2023-06-16 MED ORDER — ORAL CARE MOUTH RINSE: 15.0000 mL | Freq: Once | OROMUCOSAL | Status: DC

## 2023-06-16 MED ORDER — METOCLOPRAMIDE HCL 5 MG/ML IJ SOLN: 5.0000 mg | Freq: Three times a day (TID) | INTRAMUSCULAR | Status: DC | PRN

## 2023-06-16 MED ORDER — FLEET ENEMA RE ENEM: 1.0000 | ENEMA | Freq: Once | RECTAL | Status: DC | PRN

## 2023-06-16 MED ORDER — SALINE SPRAY 0.65 % NA SOLN
2.0000 | Freq: Every day | NASAL | Status: DC
  Filled 2023-06-16: qty 44

## 2023-06-16 MED ORDER — ROPIVACAINE HCL 5 MG/ML IJ SOLN
INTRAMUSCULAR | Status: DC | PRN
  Administered 2023-06-16: 20 mL via PERINEURAL

## 2023-06-16 MED ORDER — SODIUM CHLORIDE (PF) 0.9 % IJ SOLN
INTRAMUSCULAR | Status: AC
  Filled 2023-06-16: qty 10

## 2023-06-16 MED ORDER — BUPIVACAINE LIPOSOME 1.3 % IJ SUSP
INTRAMUSCULAR | Status: AC
  Filled 2023-06-16: qty 20

## 2023-06-16 MED ORDER — POVIDONE-IODINE 10 % EX SWAB: 2.0000 | Freq: Once | CUTANEOUS | Status: DC

## 2023-06-16 MED ORDER — TRANEXAMIC ACID-NACL 1000-0.7 MG/100ML-% IV SOLN
1000.0000 mg | INTRAVENOUS | Status: AC
Start: 2023-06-16 — End: 2023-06-16
  Administered 2023-06-16: 1000 mg via INTRAVENOUS

## 2023-06-16 MED ORDER — LIDOCAINE HCL (PF) 2 % IJ SOLN
INTRAMUSCULAR | Status: AC
  Filled 2023-06-16: qty 5

## 2023-06-16 MED ORDER — ONDANSETRON HCL 4 MG/2ML IJ SOLN: 4.0000 mg | Freq: Once | INTRAMUSCULAR | Status: DC | PRN

## 2023-06-16 MED ORDER — EZETIMIBE 10 MG PO TABS
10.0000 mg | ORAL_TABLET | Freq: Every evening | ORAL | Status: DC
  Administered 2023-06-16: 10 mg via ORAL
  Filled 2023-06-16: qty 1

## 2023-06-16 MED ORDER — ACETAMINOPHEN 500 MG PO TABS
1000.0000 mg | ORAL_TABLET | Freq: Four times a day (QID) | ORAL | Status: DC
  Administered 2023-06-16 – 2023-06-17 (×3): 1000 mg via ORAL
  Filled 2023-06-16 (×3): qty 2

## 2023-06-16 MED ORDER — ONDANSETRON HCL 4 MG/2ML IJ SOLN
4.0000 mg | Freq: Four times a day (QID) | INTRAMUSCULAR | Status: DC | PRN
  Administered 2023-06-16: 4 mg via INTRAVENOUS
  Filled 2023-06-16: qty 2

## 2023-06-16 MED ORDER — METHOCARBAMOL 1000 MG/10ML IJ SOLN: 500.0000 mg | Freq: Four times a day (QID) | INTRAMUSCULAR | Status: DC | PRN

## 2023-06-16 MED ORDER — SODIUM CHLORIDE 0.9 % IR SOLN
Status: DC | PRN
Start: 2023-06-16 — End: 2023-06-16
  Administered 2023-06-16: 1000 mL

## 2023-06-16 MED ORDER — LIDOCAINE HCL (PF) 2 % IJ SOLN
INTRAMUSCULAR | Status: DC | PRN
  Administered 2023-06-16: 30 mg via INTRADERMAL

## 2023-06-16 MED ORDER — LEVOTHYROXINE SODIUM 75 MCG PO TABS
150.0000 ug | ORAL_TABLET | Freq: Every day | ORAL | Status: DC
  Administered 2023-06-17: 150 ug via ORAL
  Filled 2023-06-16: qty 2

## 2023-06-16 MED ORDER — BISACODYL 10 MG RE SUPP: 10.0000 mg | Freq: Every day | RECTAL | Status: DC | PRN

## 2023-06-16 MED ORDER — CEFAZOLIN SODIUM-DEXTROSE 2-4 GM/100ML-% IV SOLN
2.0000 g | INTRAVENOUS | Status: AC
Start: 2023-06-16 — End: 2023-06-16
  Administered 2023-06-16: 2 g via INTRAVENOUS

## 2023-06-16 MED ORDER — SODIUM CHLORIDE (PF) 0.9 % IJ SOLN
INTRAMUSCULAR | Status: AC
  Filled 2023-06-16: qty 50

## 2023-06-16 MED ORDER — ONDANSETRON HCL 4 MG/2ML IJ SOLN
INTRAMUSCULAR | Status: AC
  Filled 2023-06-16: qty 2

## 2023-06-16 MED ORDER — FENTANYL CITRATE PF 50 MCG/ML IJ SOSY
25.0000 ug | PREFILLED_SYRINGE | INTRAMUSCULAR | Status: DC | PRN
  Administered 2023-06-16: 25 ug via INTRAVENOUS
  Administered 2023-06-16: 50 ug via INTRAVENOUS
  Administered 2023-06-16: 25 ug via INTRAVENOUS

## 2023-06-16 MED ORDER — PROPOFOL 10 MG/ML IV BOLUS
INTRAVENOUS | Status: DC | PRN
  Administered 2023-06-16: 20 mg via INTRAVENOUS
  Administered 2023-06-16 (×2): 10 mg via INTRAVENOUS
  Administered 2023-06-16: 20 mg via INTRAVENOUS
  Administered 2023-06-16: 50 mg via INTRAVENOUS
  Administered 2023-06-16: 20 mg via INTRAVENOUS
  Administered 2023-06-16: 30 mg via INTRAVENOUS
  Administered 2023-06-16: 20 mg via INTRAVENOUS

## 2023-06-16 MED ORDER — BUPIVACAINE LIPOSOME 1.3 % IJ SUSP: 20.0000 mL | Freq: Once | INTRAMUSCULAR | Status: DC

## 2023-06-16 MED ORDER — OXYCODONE HCL 5 MG PO TABS
5.0000 mg | ORAL_TABLET | ORAL | Status: DC | PRN
  Administered 2023-06-16: 10 mg via ORAL
  Administered 2023-06-16: 5 mg via ORAL
  Administered 2023-06-17 (×3): 10 mg via ORAL
  Filled 2023-06-16 (×3): qty 2

## 2023-06-16 MED ORDER — METOPROLOL SUCCINATE ER 25 MG PO TB24
12.5000 mg | ORAL_TABLET | Freq: Every morning | ORAL | Status: DC
  Administered 2023-06-17: 12.5 mg via ORAL
  Filled 2023-06-16: qty 1

## 2023-06-16 MED ORDER — DEXAMETHASONE SODIUM PHOSPHATE 10 MG/ML IJ SOLN
8.0000 mg | Freq: Once | INTRAMUSCULAR | Status: AC
  Administered 2023-06-16: 5 mg via INTRAVENOUS

## 2023-06-16 MED ORDER — APIXABAN 2.5 MG PO TABS
2.5000 mg | ORAL_TABLET | Freq: Two times a day (BID) | ORAL | Status: DC
  Administered 2023-06-17: 2.5 mg via ORAL
  Filled 2023-06-16: qty 1

## 2023-06-16 MED ORDER — BUPROPION HCL ER (XL) 150 MG PO TB24
150.0000 mg | ORAL_TABLET | Freq: Every day | ORAL | Status: DC
  Administered 2023-06-17: 150 mg via ORAL
  Filled 2023-06-16: qty 1

## 2023-06-16 MED ORDER — POLYETHYLENE GLYCOL 3350 17 G PO PACK
17.0000 g | PACK | Freq: Every day | ORAL | Status: DC | PRN
  Administered 2023-06-16: 17 g via ORAL
  Filled 2023-06-16: qty 1

## 2023-06-16 MED ORDER — DEXAMETHASONE SODIUM PHOSPHATE 10 MG/ML IJ SOLN
10.0000 mg | Freq: Once | INTRAMUSCULAR | Status: AC
  Administered 2023-06-17: 10 mg via INTRAVENOUS
  Filled 2023-06-16: qty 1

## 2023-06-16 MED ORDER — ALFUZOSIN HCL ER 10 MG PO TB24
10.0000 mg | ORAL_TABLET | Freq: Every evening | ORAL | Status: DC
  Administered 2023-06-16: 10 mg via ORAL
  Filled 2023-06-16 (×2): qty 1

## 2023-06-16 MED ORDER — 0.9 % SODIUM CHLORIDE (POUR BTL) OPTIME
TOPICAL | Status: DC | PRN
  Administered 2023-06-16: 1000 mL

## 2023-06-16 MED ORDER — BUPIVACAINE IN DEXTROSE 0.75-8.25 % IT SOLN
INTRATHECAL | Status: DC | PRN
  Administered 2023-06-16: 1.6 mL via INTRATHECAL

## 2023-06-16 MED ORDER — MIDAZOLAM HCL 5 MG/5ML IJ SOLN
INTRAMUSCULAR | Status: DC | PRN
  Administered 2023-06-16: 2 mg via INTRAVENOUS

## 2023-06-16 MED ORDER — CEFAZOLIN SODIUM-DEXTROSE 2-4 GM/100ML-% IV SOLN
INTRAVENOUS | Status: AC
  Filled 2023-06-16: qty 100

## 2023-06-16 MED ORDER — MIDAZOLAM HCL 2 MG/2ML IJ SOLN
INTRAMUSCULAR | Status: AC
  Filled 2023-06-16: qty 2

## 2023-06-16 MED ORDER — MORPHINE SULFATE (PF) 2 MG/ML IV SOLN
1.0000 mg | INTRAVENOUS | Status: DC | PRN
Start: 2023-06-16 — End: 2023-06-17

## 2023-06-16 SURGICAL SUPPLY — 45 items
ARTISURF 10M VER 10-12 GH KNEE (Knees) IMPLANT
BAG COUNTER SPONGE SURGICOUNT (BAG) IMPLANT
BAG ZIPLOCK 12X15 (MISCELLANEOUS) ×1 IMPLANT
BLADE SAG 18X100X1.27 (BLADE) ×1 IMPLANT
BLADE SAW SGTL 11.0X1.19X90.0M (BLADE) ×1 IMPLANT
BNDG ELASTIC 6INX 5YD STR LF (GAUZE/BANDAGES/DRESSINGS) ×1 IMPLANT
BOWL SMART MIX CTS (DISPOSABLE) ×1 IMPLANT
CEMENT HV SMART SET (Cement) ×2 IMPLANT
COMP FEM CMT PS HIP STD 12 RT (Joint) IMPLANT
COVER SURGICAL LIGHT HANDLE (MISCELLANEOUS) ×1 IMPLANT
CUFF TRNQT CYL 34X4.125X (TOURNIQUET CUFF) ×1 IMPLANT
DERMABOND ADVANCED .7 DNX12 (GAUZE/BANDAGES/DRESSINGS) ×1 IMPLANT
DRAPE U-SHAPE 47X51 STRL (DRAPES) ×1 IMPLANT
DRSG AQUACEL AG ADV 3.5X10 (GAUZE/BANDAGES/DRESSINGS) ×1 IMPLANT
DURAPREP 26ML APPLICATOR (WOUND CARE) ×1 IMPLANT
ELECT REM PT RETURN 15FT ADLT (MISCELLANEOUS) ×1 IMPLANT
GLOVE BIO SURGEON STRL SZ 6.5 (GLOVE) IMPLANT
GLOVE BIO SURGEON STRL SZ7 (GLOVE) IMPLANT
GLOVE BIO SURGEON STRL SZ8 (GLOVE) ×1 IMPLANT
GLOVE BIOGEL PI IND STRL 7.0 (GLOVE) IMPLANT
GLOVE BIOGEL PI IND STRL 8 (GLOVE) ×1 IMPLANT
GOWN STRL REUS W/ TWL LRG LVL3 (GOWN DISPOSABLE) ×1 IMPLANT
HOLDER FOLEY CATH W/STRAP (MISCELLANEOUS) IMPLANT
IMMOBILIZER KNEE 20 (SOFTGOODS) ×1 IMPLANT
IMMOBILIZER KNEE 20 THIGH 36 (SOFTGOODS) ×1 IMPLANT
KIT TURNOVER KIT A (KITS) IMPLANT
MANIFOLD NEPTUNE II (INSTRUMENTS) ×1 IMPLANT
NS IRRIG 1000ML POUR BTL (IV SOLUTION) ×1 IMPLANT
PACK TOTAL KNEE CUSTOM (KITS) ×1 IMPLANT
PADDING CAST COTTON 6X4 STRL (CAST SUPPLIES) ×2 IMPLANT
PIN DRILL HDLS TROCAR 75 4PK (PIN) IMPLANT
PROTECTOR NERVE ULNAR (MISCELLANEOUS) ×1 IMPLANT
SCREW FEMALE HEX FIX 25X2.5 (ORTHOPEDIC DISPOSABLE SUPPLIES) IMPLANT
SET HNDPC FAN SPRY TIP SCT (DISPOSABLE) ×1 IMPLANT
STEM POLY PAT PLY 38M KNEE (Knees) IMPLANT
STEM TIBIA 5 DEG SZ H R KNEE (Knees) IMPLANT
SUT MNCRL AB 4-0 PS2 18 (SUTURE) ×1 IMPLANT
SUT STRATAFIX 0 PDS 27 VIOLET (SUTURE) ×1 IMPLANT
SUT VIC AB 2-0 CT1 TAPERPNT 27 (SUTURE) ×3 IMPLANT
SUTURE STRATFX 0 PDS 27 VIOLET (SUTURE) ×1 IMPLANT
TIBIA STEM 5 DEG SZ H R KNEE (Knees) ×1 IMPLANT
TRAY FOLEY MTR SLVR 16FR STAT (SET/KITS/TRAYS/PACK) IMPLANT
TUBE SUCTION HIGH CAP CLEAR NV (SUCTIONS) ×1 IMPLANT
WATER STERILE IRR 1000ML POUR (IV SOLUTION) ×2 IMPLANT
WRAP KNEE MAXI GEL POST OP (GAUZE/BANDAGES/DRESSINGS) ×1 IMPLANT

## 2023-06-16 NOTE — Anesthesia Postprocedure Evaluation (Signed)
 Anesthesia Post Note  Patient: [facility]  Procedure(s) Performed: ARTHROPLASTY, KNEE, TOTAL (Right: Knee)     Patient location during evaluation: PACU Anesthesia Type: Spinal Level of consciousness: awake, awake and alert and oriented Pain management: pain level controlled Vital Signs Assessment: post-procedure vital signs reviewed and stable Respiratory status: spontaneous breathing, nonlabored ventilation and respiratory function stable Cardiovascular status: blood pressure returned to baseline and stable Postop Assessment: no headache, no backache, spinal receding and no apparent nausea or vomiting Anesthetic complications: no   No notable events documented.  Last Vitals:  Vitals:   06/16/23 1037 06/16/23 1249  BP: 135/80 (!) 140/75  Pulse: (!) 53 (!) 59  Resp: 15 18  Temp: 36.5 C   SpO2: 95% 98%    Last Pain:                 Collene Schlichter

## 2023-06-16 NOTE — Progress Notes (Signed)
 Orthopedic Tech Progress Note Patient Details:  Sean Stanley [birth year] [id number]  CPM Right Knee CPM Right Knee: Off Right Knee Flexion (Degrees): 40 Right Knee Extension (Degrees): 10  Post Interventions Patient Tolerated: Well  [name] 06/16/2023, 1:32 PM

## 2023-06-16 NOTE — Evaluation (Signed)
 Physical Therapy Evaluation Patient Details Name: Sean Stanley MRN: [id number] DOB: [birth year] Today's Date: 06/16/2023  History of Present Illness  83 yo male s/p RTKA on 06/16/23. PMH: NHL, BPH, chronic pain, afib, peripheral neuropathy, OSA, mitral valve repair  Clinical Impression  Pt is s/p TKA resulting in the deficits listed below (see PT Problem List).  Pt doing exceptionally well, motivated, was doing pre-hab/water therapy prior to surgery. Amb 86' with RW and CGA, with distance limited by PT d/t likelihood that regional anesthesia still if effect at this time; HEP initiated Anticipate continued progress in acute setting.  Pt will benefit from acute skilled PT to increase their independence and safety with mobility to allow discharge.          If plan is discharge home, recommend the following: A little help with bathing/dressing/bathroom;Assist for transportation;Help with stairs or ramp for entrance   Can travel by private vehicle        Equipment Recommendations None recommended by PT  Recommendations for Other Services       Functional Status Assessment Patient has had a recent decline in their functional status and demonstrates the ability to make significant improvements in function in a reasonable and predictable amount of time.     Precautions / Restrictions Precautions Precautions: Fall;Knee Restrictions Weight Bearing Restrictions Per Provider Order: No Other Position/Activity Restrictions: WBAT      Mobility  Bed Mobility Overal bed mobility: Needs Assistance Bed Mobility: Supine to Sit     Supine to sit: Supervision     General bed mobility comments: for safety    Transfers Overall transfer level: Needs assistance Equipment used: Rolling walker (2 wheels) Transfers: Sit to/from Stand Sit to Stand: Contact guard assist           General transfer comment: cues for hand placement and RLE position    Ambulation/Gait Ambulation/Gait  assistance: Contact guard assist Gait Distance (Feet): 80 Feet Assistive device: Rolling walker (2 wheels) Gait Pattern/deviations: Step-to pattern       General Gait Details: cues for sequence and RW position  Stairs            Wheelchair Mobility     Tilt Bed    Modified Rankin (Stroke Patients Only)       Balance Overall balance assessment: No apparent balance deficits (not formally assessed)                                           Pertinent Vitals/Pain Pain Assessment Pain Assessment: Faces Faces Pain Scale: Hurts little more Pain Location: right knee Pain Descriptors / Indicators: Discomfort, Sore Pain Intervention(s): Limited activity within patient's tolerance, Monitored during session, Premedicated before session, Repositioned    Home Living Family/patient expects to be discharged to:: Skilled nursing facility Living Arrangements: Spouse/significant other                 Additional Comments: resides at [facility], planning to go to rehab at [facility]    Prior Function Prior Level of Function : Independent/Modified Independent                     Extremity/Trunk Assessment   Upper Extremity Assessment Upper Extremity Assessment: Overall WFL for tasks assessed    Lower Extremity Assessment Lower Extremity Assessment: RLE deficits/detail RLE Deficits / Details: ankle WFL, knee extension and hip flexion 2+/5  Communication   Communication Communication: No apparent difficulties    Cognition Arousal: Alert Behavior During Therapy: WFL for tasks assessed/performed   PT - Cognitive impairments: No apparent impairments                         Following commands: Intact       Cueing Cueing Techniques: Verbal cues     General Comments      Exercises Total Joint Exercises Ankle Circles/Pumps: AROM, Both, 5 reps Quad Sets: 5 reps, Both, AROM Straight Leg Raises: AROM, Right, 5 reps    Assessment/Plan    PT Assessment Patient needs continued PT services  PT Problem List Decreased strength;Decreased range of motion;Decreased activity tolerance;Decreased knowledge of use of DME;Pain;Decreased mobility       PT Treatment Interventions DME instruction;Gait training;Stair training;Functional mobility training;Therapeutic activities;Patient/family education;Therapeutic exercise    PT Goals (Current goals can be found in the Care Plan section)  Acute Rehab PT Goals PT Goal Formulation: With patient Time For Goal Achievement: 06/23/23 Potential to Achieve Goals: Good    Frequency 7X/week     Co-evaluation               AM-PAC PT "6 Clicks" Mobility  Outcome Measure Help needed turning from your back to your side while in a flat bed without using bedrails?: A Little Help needed moving from lying on your back to sitting on the side of a flat bed without using bedrails?: A Little Help needed moving to and from a bed to a chair (including a wheelchair)?: A Little Help needed standing up from a chair using your arms (e.g., wheelchair or bedside chair)?: A Little Help needed to walk in hospital room?: A Little Help needed climbing 3-5 steps with a railing? : A Little 6 Click Score: 18    End of Session Equipment Utilized During Treatment: Gait belt Activity Tolerance: Patient tolerated treatment well Patient left: with call bell/phone within reach;in chair;with family/visitor present;with chair alarm set Nurse Communication: Mobility status PT Visit Diagnosis: Other abnormalities of gait and mobility (R26.89)    Time: 4098-1191 PT Time Calculation (min) (ACUTE ONLY): 20 min   Charges:   PT Evaluation $PT Eval Low Complexity: 1 Low   PT General Charges $$ ACUTE PT VISIT: 1 Visit         Teressa Mcglocklin, PT  Acute Rehab Dept ([facility]) [phone]  06/16/2023   [facility] 06/16/2023, 5:15 PM

## 2023-06-16 NOTE — Interval H&P Note (Signed)
 History and Physical Interval Note:  06/16/2023 6:34 AM  Sean Stanley  has presented today for surgery, with the diagnosis of Right Knee Osteoarthritis.  The various methods of treatment have been discussed with the patient and family. After consideration of risks, benefits and other options for treatment, the patient has consented to  Procedure(s): ARTHROPLASTY, KNEE, TOTAL (Right) as a surgical intervention.  The patient's history has been reviewed, patient examined, no change in status, stable for surgery.  I have reviewed the patient's chart and labs.  Questions were answered to the patient's satisfaction.     Homero Fellers [name]

## 2023-06-16 NOTE — Anesthesia Procedure Notes (Signed)
 Anesthesia Regional Block: Adductor canal block   Pre-Anesthetic Checklist: , timeout performed,  Correct Patient, Correct Site, Correct Laterality,  Correct Procedure, Correct Position, site marked,  Risks and benefits discussed,  Surgical consent,  Pre-op evaluation,  At surgeon's request and post-op pain management  Laterality: Right  Prep: chloraprep       Needles:  Injection technique: Single-shot  Needle Type: Echogenic Needle     Needle Length: 9cm  Needle Gauge: 21     Additional Needles:   Procedures:,,,, ultrasound used (permanent image in chart),,    Narrative:  Start time: 06/16/2023 6:52 AM End time: 06/16/2023 7:00 AM Injection made incrementally with aspirations every 5 mL.  Performed by: Personally  Anesthesiologist: Collene Schlichter, MD  Additional Notes: No pain on injection. No increased resistance to injection. Injection made in 5cc increments.  Good needle visualization.  Patient tolerated procedure well.

## 2023-06-16 NOTE — Anesthesia Procedure Notes (Signed)
 Spinal  Patient location during procedure: OR Start time: 06/16/2023 7:10 AM End time: 06/16/2023 7:13 AM Reason for block: surgical anesthesia Staffing Performed: anesthesiologist  Anesthesiologist: Collene Schlichter, MD Performed by: Collene Schlichter, MD Authorized by: Collene Schlichter, MD   Preanesthetic Checklist Completed: patient identified, IV checked, risks and benefits discussed, surgical consent, monitors and equipment checked, pre-op evaluation and timeout performed Spinal Block Patient position: sitting Prep: DuraPrep and site prepped and draped Patient monitoring: continuous pulse ox and blood pressure Approach: midline Location: L3-4 Injection technique: single-shot Needle Needle type: Pencan  Needle gauge: 24 G Assessment Events: CSF return Additional Notes Functioning IV was confirmed and monitors were applied. Sterile prep and drape, including hand hygiene, mask and sterile gloves were used. The patient was positioned and the spine was prepped. The skin was anesthetized with lidocaine.  Free flow of clear CSF was obtained prior to injecting local anesthetic into the CSF.  The spinal needle aspirated freely following injection.  The needle was carefully withdrawn.  The patient tolerated the procedure well. Consent was obtained prior to procedure with all questions answered and concerns addressed. Risks including but not limited to bleeding, infection, nerve damage, paralysis, failed block, inadequate analgesia, allergic reaction, high spinal, itching and headache were discussed and the patient wished to proceed.   Arrie Aran, MD

## 2023-06-16 NOTE — Discharge Instructions (Signed)
 Sean Gross, MD Total Joint Specialist [facility] [address] [place], [place] [address] [phone]  TOTAL KNEE REPLACEMENT POSTOPERATIVE DIRECTIONS    Knee Rehabilitation, Guidelines Following Surgery  Results after knee surgery are often greatly improved when you follow the exercise, range of motion and muscle strengthening exercises prescribed by your doctor. Safety measures are also important to protect the knee from further injury. If any of these exercises cause you to have increased pain or swelling in your knee joint, decrease the amount until you are comfortable again and slowly increase them. If you have problems or questions, call your caregiver or physical therapist for advice.   BLOOD CLOT PREVENTION Resume taking 5mg  Eliquis twice daily upon discharge from the hospital. You may resume your vitamins/supplements upon discharge from the hospital. Do not take any NSAIDs (Advil, Aleve, Ibuprofen, Meloxicam, etc.) while taking Eliquis.     HOME CARE INSTRUCTIONS  Remove items at home which could result in a fall. This includes throw rugs or furniture in walking pathways.  ICE to the affected knee as much as tolerated. Icing helps control swelling. If the swelling is well controlled you will be more comfortable and rehab easier. Continue to use ice on the knee for pain and swelling from surgery. You may notice swelling that will progress down to the foot and ankle. This is normal after surgery. Elevate the leg when you are not up walking on it.    Continue to use the breathing machine which will help keep your temperature down. It is common for your temperature to cycle up and down following surgery, especially at night when you are not up moving around and exerting yourself. The breathing machine keeps your lungs expanded and your temperature down. Do not place pillow under the operative knee, focus on keeping the knee straight while  resting  DIET You may resume your previous home diet once you are discharged from the hospital.  DRESSING / WOUND CARE / SHOWERING Keep your bulky bandage on for 2 days. On the third post-operative day you may remove the Ace bandage and gauze. There is a waterproof adhesive bandage on your skin which will stay in place until your first follow-up appointment. Once you remove this you will not need to place another bandage You may begin showering 3 days following surgery, but do not submerge the incision under water.  ACTIVITY For the first 5 days, the key is rest and control of pain and swelling Do your home exercises twice a day starting on post-operative day 3. On the days you go to physical therapy, just do the home exercises once that day. You should rest, ice and elevate the leg for 50 minutes out of every hour. Get up and walk/stretch for 10 minutes per hour. After 5 days you can increase your activity slowly as tolerated. Walk with your walker as instructed. Use the walker until you are comfortable transitioning to a cane. Walk with the cane in the opposite hand of the operative leg. You may discontinue the cane once you are comfortable and walking steadily. Avoid periods of inactivity such as sitting longer than an hour when not asleep. This helps prevent blood clots.  You may discontinue the knee immobilizer once you are able to perform a straight leg raise while lying down. You may resume a sexual relationship in one month or when given the OK by your doctor.  You may return to work once you are cleared by your  doctor.  Do not drive a car for 6 weeks or until released by your surgeon.  Do not drive while taking narcotics.  TED HOSE STOCKINGS Wear the elastic stockings on both legs for three weeks following surgery during the day. You may remove them at night for sleeping.  WEIGHT BEARING Weight bearing as tolerated with assist device (walker, cane, etc) as directed, use it as long  as suggested by your surgeon or therapist, typically at least 4-6 weeks.  POSTOPERATIVE CONSTIPATION PROTOCOL Constipation - defined medically as fewer than three stools per week and severe constipation as less than one stool per week.  One of the most common issues patients have following surgery is constipation.  Even if you have a regular bowel pattern at home, your normal regimen is likely to be disrupted due to multiple reasons following surgery.  Combination of anesthesia, postoperative narcotics, change in appetite and fluid intake all can affect your bowels.  In order to avoid complications following surgery, here are some recommendations in order to help you during your recovery period.  Colace (docusate) - Pick up an over-the-counter form of Colace or another stool softener and take twice a day as long as you are requiring postoperative pain medications.  Take with a full glass of water daily.  If you experience loose stools or diarrhea, hold the colace until you stool forms back up. If your symptoms do not get better within 1 week or if they get worse, check with your doctor. Dulcolax (bisacodyl) - Pick up over-the-counter and take as directed by the product packaging as needed to assist with the movement of your bowels.  Take with a full glass of water.  Use this product as needed if not relieved by Colace only.  MiraLax (polyethylene glycol) - Pick up over-the-counter to have on hand. MiraLax is a solution that will increase the amount of water in your bowels to assist with bowel movements.  Take as directed and can mix with a glass of water, juice, soda, coffee, or tea. Take if you go more than two days without a movement. Do not use MiraLax more than once per day. Call your doctor if you are still constipated or irregular after using this medication for 7 days in a row.  If you continue to have problems with postoperative constipation, please contact the office for further assistance and  recommendations.  If you experience "the worst abdominal pain ever" or develop nausea or vomiting, please contact the office immediatly for further recommendations for treatment.  ITCHING If you experience itching with your medications, try taking only a single pain pill, or even half a pain pill at a time.  You can also use Benadryl over the counter for itching or also to help with sleep.   MEDICATIONS See your medication summary on the "After Visit Summary" that the nursing staff will review with you prior to discharge.  You may have some home medications which will be placed on hold until you complete the course of blood thinner medication.  It is important for you to complete the blood thinner medication as prescribed by your surgeon.  Continue your approved medications as instructed at time of discharge.  PRECAUTIONS If you experience chest pain or shortness of breath - call 911 immediately for transfer to the hospital emergency department.  If you develop a fever greater that 101 F, purulent drainage from wound, increased redness or drainage from wound, foul odor from the wound/dressing, or calf pain - CONTACT  YOUR SURGEON.                                                   FOLLOW-UP APPOINTMENTS Make sure you keep all of your appointments after your operation with your surgeon and caregivers. You should call the office at the above phone number and make an appointment for approximately two weeks after the date of your surgery or on the date instructed by your surgeon outlined in the "After Visit Summary".  RANGE OF MOTION AND STRENGTHENING EXERCISES  Rehabilitation of the knee is important following a knee injury or an operation. After just a few days of immobilization, the muscles of the thigh which control the knee become weakened and shrink (atrophy). Knee exercises are designed to build up the tone and strength of the thigh muscles and to improve knee motion. Often times heat used for  twenty to thirty minutes before working out will loosen up your tissues and help with improving the range of motion but do not use heat for the first two weeks following surgery. These exercises can be done on a training (exercise) mat, on the floor, on a table or on a bed. Use what ever works the best and is most comfortable for you Knee exercises include:  Leg Lifts - While your knee is still immobilized in a splint or cast, you can do straight leg raises. Lift the leg to 60 degrees, hold for 3 sec, and slowly lower the leg. Repeat 10-20 times 2-3 times daily. Perform this exercise against resistance later as your knee gets better.  Quad and Hamstring Sets - Tighten up the muscle on the front of the thigh (Quad) and hold for 5-10 sec. Repeat this 10-20 times hourly. Hamstring sets are done by pushing the foot backward against an object and holding for 5-10 sec. Repeat as with quad sets.  Leg Slides: Lying on your back, slowly slide your foot toward your buttocks, bending your knee up off the floor (only go as far as is comfortable). Then slowly slide your foot back down until your leg is flat on the floor again. Angel Wings: Lying on your back spread your legs to the side as far apart as you can without causing discomfort.  A rehabilitation program following serious knee injuries can speed recovery and prevent re-injury in the future due to weakened muscles. Contact your doctor or a physical therapist for more information on knee rehabilitation.   POST-OPERATIVE OPIOID TAPER INSTRUCTIONS: It is important to wean off of your opioid medication as soon as possible. If you do not need pain medication after your surgery it is ok to stop day one. Opioids include: Codeine, Hydrocodone(Norco, Vicodin), Oxycodone(Percocet, oxycontin) and hydromorphone amongst others.  Long term and even short term use of opiods can cause: Increased pain response Dependence Constipation Depression Respiratory depression And  more.  Withdrawal symptoms can include Flu like symptoms Nausea, vomiting And more Techniques to manage these symptoms Hydrate well Eat regular healthy meals Stay active Use relaxation techniques(deep breathing, meditating, yoga) Do Not substitute Alcohol to help with tapering If you have been on opioids for less than two weeks and do not have pain than it is ok to stop all together.  Plan to wean off of opioids This plan should start within one week post op of your joint replacement. Maintain the  same interval or time between taking each dose and first decrease the dose.  Cut the total daily intake of opioids by one tablet each day Next start to increase the time between doses. The last dose that should be eliminated is the evening dose.   IF YOU ARE TRANSFERRED TO A SKILLED REHAB FACILITY If the patient is transferred to a skilled rehab facility following release from the hospital, a list of the current medications will be sent to the facility for the patient to continue.  When discharged from the skilled rehab facility, please have the facility set up the patient's Home Health Physical Therapy prior to being released. Also, the skilled facility will be responsible for providing the patient with their medications at time of release from the facility to include their pain medication, the muscle relaxants, and their blood thinner medication. If the patient is still at the rehab facility at time of the two week follow up appointment, the skilled rehab facility will also need to assist the patient in arranging follow up appointment in our office and any transportation needs.  MAKE SURE YOU:  Understand these instructions.  Get help right away if you are not doing well or get worse.   DENTAL ANTIBIOTICS:  In most cases prophylactic antibiotics for Dental procdeures after total joint surgery are not necessary.  Exceptions are as follows:  1. History of prior total joint infection  2.  Severely immunocompromised (Organ Transplant, cancer chemotherapy, Rheumatoid biologic meds such as Humera)  3. Poorly controlled diabetes (A1C &gt; 8.0, blood glucose over 200)  If you have one of these conditions, contact your surgeon for an antibiotic prescription, prior to your dental procedure.    Pick up stool softner and laxative for home use following surgery while on pain medications. Do not submerge incision under water. Please use good hand washing techniques while changing dressing each day. May shower starting three days after surgery. Please use a clean towel to pat the incision dry following showers. Continue to use ice for pain and swelling after surgery. Do not use any lotions or creams on the incision until instructed by your surgeon.

## 2023-06-16 NOTE — Progress Notes (Signed)
 Orthopedic Tech Progress Note Patient Details:  Sean Stanley 1940-05-09 [id number]  CPM Right Knee CPM Right Knee: On Right Knee Flexion (Degrees): 40 Right Knee Extension (Degrees): 10  Post Interventions Patient Tolerated: Well  Darleen Crocker 06/16/2023, 9:46 AM

## 2023-06-16 NOTE — Op Note (Signed)
 OPERATIVE REPORT-TOTAL KNEE ARTHROPLASTY   Pre-operative diagnosis- Osteoarthritis  Right knee(s)  Post-operative diagnosis- Osteoarthritis Right knee(s)  Procedure-  Right  Total Knee Arthroplasty ([employer] titanium knee)  Surgeon- Gus Rankin. Nalla Purdy, MD  Assistant- Arcola Jansky, PA-C   Anesthesia-   Adductor canal block and spinal  EBL-10 mL   Drains None  Tourniquet time-  Total Tourniquet Time Documented: Thigh (Right) - 44 minutes Total: Thigh (Right) - 44 minutes     Complications- None  Condition-PACU - hemodynamically stable.   Brief Clinical Note  Sean Stanley is a 83 y.o. year old male with end stage OA of his right knee with progressively worsening pain and dysfunction. He has constant pain, with activity and at rest and significant functional deficits with difficulties even with ADLs. He has had extensive non-op management including analgesics, injections of cortisone and viscosupplements, and home exercise program, but remains in significant pain with significant dysfunction. Radiographs show bone on bone arthritis medial and patellofemoral. He presents now for right Total Knee Arthroplasty.     Procedure in detail---   The patient is brought into the operating room and positioned supine on the operating table. After successful administration of  Adductor canal block and spinal,   a tourniquet is placed high on the  Right thigh(s) and the lower extremity is prepped and draped in the usual sterile fashion. Time out is performed by the operating team and then the  Right lower extremity is wrapped in Esmarch, knee flexed and the tourniquet inflated to 300 mmHg.       A midline incision is made with a ten blade through the subcutaneous tissue to the level of the extensor mechanism. A fresh blade is used to make a medial parapatellar arthrotomy. Soft tissue over the proximal medial tibia is subperiosteally elevated to the joint line with a knife and into the  semimembranosus bursa with a Cobb elevator. Soft tissue over the proximal lateral tibia is elevated with attention being paid to avoiding the patellar tendon on the tibial tubercle. The patella is everted, knee flexed 90 degrees and the ACL and PCL are removed. Findings are bone on bone medial and patellofemoral with large global osteophytes        The drill is used to create a starting hole in the distal femur and the canal is thoroughly irrigated with sterile saline to remove the fatty contents. The 5 degree Right  valgus alignment guide is placed into the femoral canal and the distal femoral cutting block is pinned to remove 10 mm off the distal femur. Resection is made with an oscillating saw.      The tibia is subluxed forward and the menisci are removed. The extramedullary alignment guide is placed referencing proximally at the medial aspect of the tibial tubercle and distally along the second metatarsal axis and tibial crest. The block is pinned to remove 2mm off the more deficient medial  side. Resection is made with an oscillating saw. Size H is the most appropriate size for the tibia and the proximal tibia is prepared with the modular drill and keel punch for that size.      The femoral sizing guide is placed and size 12 is most appropriate. Rotation is marked off the epicondylar axis and confirmed by creating a rectangular flexion gap at 90 degrees. The size 12 cutting block is pinned in this rotation and the anterior, posterior and chamfer cuts are made with the oscillating saw. The intercondylar block is then  placed and that cut is made.      Trial size H tibial component, trial size 12 posterior stabilized femur and a 10  mm posterior stabilized fixed bearing insert trial is placed. Full extension is achieved with excellent varus/valgus and anterior/posterior balance throughout full range of motion. The patella is everted and thickness measured to be 27  mm. Free hand resection is taken to 15 mm, a  38 template is placed, lug holes are drilled, trial patella is placed, and it tracks normally. Osteophytes are removed off the posterior femur with the trial in place. All trials are removed and the cut bone surfaces prepared with pulsatile lavage. Cement is mixed and once ready for implantation, the size H tibial implant, size  12 posterior stabilized femoral component, and the size 38 patella are cemented in place and the patella is held with the clamp. The trial insert is placed and the knee held in full extension. The Exparel (20 ml mixed with 60 ml saline) is injected into the extensor mechanism, posterior capsule, medial and lateral gutters and subcutaneous tissues.  All extruded cement is removed and once the cement is hard the permanent 10 mm posterior stabilized fixed bearing insert is placed into the tibial tray.      The wound is copiously irrigated with saline solution and the extensor mechanism closed with # 0 Stratofix suture. The tourniquet is released for a total tourniquet time of 43  minutes. Flexion against gravity is 140 degrees and the patella tracks normally. Subcutaneous tissue is closed with 2.0 vicryl and subcuticular with running 4.0 Monocryl. The incision is cleaned and dried and steri-strips and a bulky sterile dressing are applied. The limb is placed into a knee immobilizer and the patient is awakened and transported to recovery in stable condition.      Please note that a surgical assistant was a medical necessity for this procedure in order to perform it in a safe and expeditious manner. Surgical assistant was necessary to retract the ligaments and vital neurovascular structures to prevent injury to them and also necessary for proper positioning of the limb to allow for anatomic placement of the prosthesis.   Gus Rankin Angeliah Wisdom, MD    06/16/2023, 8:25 AM

## 2023-06-16 NOTE — Care Plan (Signed)
 Ortho Bundle Case Management Note  Patient Details  Name: Sean Stanley MRN: [id number] Date of Birth: Sep 09, 1940  RT TKA on 06/16/23  DCP: [facility] DME: No needs, has RW PT: [facility]                  DME Arranged:  N/A DME Agency:  NA  HH Arranged:    HH Agency:     Additional Comments: Please contact me with any questions of if this plan should need to change.  Aida Raider, Case Manager [facility] [phone]   06/16/2023, 1:19 PM

## 2023-06-16 NOTE — Transfer of Care (Signed)
 Immediate Anesthesia Transfer of Care Note  Patient: Sean Stanley  Procedure(s) Performed: ARTHROPLASTY, KNEE, TOTAL (Right: Knee)  Patient Location: PACU  Anesthesia Type:Spinal  Level of Consciousness: awake, alert , oriented, and patient cooperative  Airway & Oxygen Therapy: Patient Spontanous Breathing and Patient connected to face mask oxygen  Post-op Assessment: Report given to RN and Post -op Vital signs reviewed and stable  Post vital signs: Reviewed and stable  Last Vitals:  Vitals Value Taken Time  BP 111/68 06/16/23 0853  Temp    Pulse 54 06/16/23 0856  Resp 17 06/16/23 0856  SpO2 96 % 06/16/23 0856  Vitals shown include unfiled device data.  Last Pain:  Vitals:   06/16/23 0547  TempSrc:   PainSc: 0-No pain         Complications: No notable events documented.

## 2023-06-16 NOTE — Anesthesia Procedure Notes (Signed)
 Procedure Name: MAC Date/Time: 06/16/2023 7:16 AM  Performed by: Dennison Nancy, CRNAPre-anesthesia Checklist: Patient identified, Emergency Drugs available, Suction available, Patient being monitored and Timeout performed Patient Re-evaluated:Patient Re-evaluated prior to induction Oxygen Delivery Method: Simple face mask Dental Injury: Teeth and Oropharynx as per pre-operative assessment

## 2023-06-17 ENCOUNTER — Encounter (HOSPITAL_COMMUNITY): Payer: Self-pay | Admitting: Orthopedic Surgery

## 2023-06-17 DIAGNOSIS — Z85828 Personal history of other malignant neoplasm of skin: Secondary | ICD-10-CM | POA: Diagnosis not present

## 2023-06-17 DIAGNOSIS — Z85850 Personal history of malignant neoplasm of thyroid: Secondary | ICD-10-CM | POA: Diagnosis not present

## 2023-06-17 DIAGNOSIS — M1711 Unilateral primary osteoarthritis, right knee: Secondary | ICD-10-CM | POA: Diagnosis not present

## 2023-06-17 DIAGNOSIS — I480 Paroxysmal atrial fibrillation: Secondary | ICD-10-CM | POA: Diagnosis not present

## 2023-06-17 DIAGNOSIS — Z8572 Personal history of non-Hodgkin lymphomas: Secondary | ICD-10-CM | POA: Diagnosis not present

## 2023-06-17 DIAGNOSIS — I2510 Atherosclerotic heart disease of native coronary artery without angina pectoris: Secondary | ICD-10-CM | POA: Diagnosis not present

## 2023-06-17 LAB — CBC
HCT: 36.6 % — ABNORMAL LOW (ref 39.0–52.0)
Hemoglobin: 11.8 g/dL — ABNORMAL LOW (ref 13.0–17.0)
MCH: 30.9 pg (ref 26.0–34.0)
MCHC: 32.2 g/dL (ref 30.0–36.0)
MCV: 95.8 fL (ref 80.0–100.0)
Platelets: 175 10*3/uL (ref 150–400)
RBC: 3.82 MIL/uL — ABNORMAL LOW (ref 4.22–5.81)
RDW: 12.9 % (ref 11.5–15.5)
WBC: 11.6 10*3/uL — ABNORMAL HIGH (ref 4.0–10.5)
nRBC: 0 % (ref 0.0–0.2)

## 2023-06-17 LAB — BASIC METABOLIC PANEL
Anion gap: 7 (ref 5–15)
BUN: 22 mg/dL (ref 8–23)
CO2: 26 mmol/L (ref 22–32)
Calcium: 8.5 mg/dL — ABNORMAL LOW (ref 8.9–10.3)
Chloride: 102 mmol/L (ref 98–111)
Creatinine, Ser: 1.02 mg/dL (ref 0.61–1.24)
GFR, Estimated: >60 mL/min (ref >60)
Glucose, Bld: 121 mg/dL — ABNORMAL HIGH (ref 70–99)
Potassium: 4.3 mmol/L (ref 3.5–5.1)
Sodium: 135 mmol/L (ref 135–145)

## 2023-06-17 MED ORDER — METHOCARBAMOL 500 MG PO TABS: 500.0000 mg | ORAL_TABLET | Freq: Four times a day (QID) | ORAL | 0 refills | Status: DC | PRN

## 2023-06-17 MED ORDER — OXYCODONE HCL 5 MG PO TABS
5.0000 mg | ORAL_TABLET | Freq: Four times a day (QID) | ORAL | 0 refills | Status: DC | PRN
Start: 2023-06-17 — End: 2023-06-17

## 2023-06-17 MED ORDER — ONDANSETRON HCL 4 MG PO TABS: 4.0000 mg | ORAL_TABLET | Freq: Four times a day (QID) | ORAL | 0 refills | Status: DC | PRN

## 2023-06-17 MED ORDER — TRAMADOL HCL 50 MG PO TABS
50.0000 mg | ORAL_TABLET | Freq: Four times a day (QID) | ORAL | 0 refills | Status: DC | PRN
Start: 2023-06-17 — End: 2023-06-17

## 2023-06-17 MED ORDER — TRAMADOL HCL 50 MG PO TABS: 50.0000 mg | ORAL_TABLET | Freq: Four times a day (QID) | ORAL | 0 refills | Status: DC | PRN

## 2023-06-17 MED ORDER — OXYCODONE HCL 5 MG PO TABS
5.0000 mg | ORAL_TABLET | Freq: Four times a day (QID) | ORAL | 0 refills | Status: DC | PRN
Start: 2023-06-17 — End: 2023-06-27

## 2023-06-17 NOTE — Progress Notes (Signed)
 Subjective: 1 Day Post-Op Procedure(s) (LRB): ARTHROPLASTY, KNEE, TOTAL (Right) Patient reports pain as mild.   Patient seen in rounds by Dr. Lequita Halt. Patient is well, and has had no acute complaints or problems No issues overnight. Denies chest pain, SOB, or calf pain. Foley catheter removed this AM.  We will continue therapy today, ambulated 48' yesterday.   Objective: Vital signs in last 24 hours: Temp:  [97.4 F (36.3 C)-97.7 F (36.5 C)] 97.5 F (36.4 C) (03/25 0622) Pulse Rate:  [53-69] 65 (03/25 0810) Resp:  [12-18] 18 (03/25 0622) BP: (109-141)/(67-83) 129/70 (03/25 0810) SpO2:  [90 %-98 %] 97 % (03/25 0622)  Intake/Output from previous day:  Intake/Output Summary (Last 24 hours) at 06/17/2023 0831 Last data filed at 06/17/2023 0500 Gross per 24 hour  Intake 1724.68 ml  Output 2450 ml  Net -725.32 ml     Intake/Output this shift: No intake/output data recorded.  Labs: Recent Labs    06/17/23 0336  HGB 11.8*   Recent Labs    06/17/23 0336  WBC 11.6*  RBC 3.82*  HCT 36.6*  PLT 175   Recent Labs    06/17/23 0336  NA 135  K 4.3  CL 102  CO2 26  BUN 22  CREATININE 1.02  GLUCOSE 121*  CALCIUM 8.5*   No results for input(s): "LABPT", "INR" in the last 72 hours.  Exam: General - Patient is Alert and Oriented Extremity - Neurologically intact Neurovascular intact Sensation intact distally Dorsiflexion/Plantar flexion intact Dressing - dressing C/D/I Motor Function - intact, moving foot and toes well on exam.   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 [facility])    being followed at [facility]   Aneurysm of splenic  artery (HCC) 2014   s/p coiling 12/16/12 - [facility]   Atrial flutter (HCC)    BPH (benign prostatic hyperplasia)    Cancer (HCC)    melanoma on lower right back and left chest - surgically removed and cleared   Difficult intubation    Dysrhythmia    H/O agent Orange exposure    Headache    migraine- not current   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 Earl Gala told him he had improved   Paroxysmal atrial fibrillation (HCC) 01/02/2021   Pneumonia    Thoracic ascending aortic aneurysm (HCC)    4.5 cm 03/2018 CT. 4.4 cm on echo 10/2021 and on CTA 03/2021   Thyroid cancer (HCC)     Assessment/Plan: 1 Day Post-Op Procedure(s) (LRB): ARTHROPLASTY, KNEE, TOTAL (Right) Principal Problem:   OA (osteoarthritis) of knee Active Problems:   Primary osteoarthritis of right knee  Estimated body mass index is 27.84 kg/m as calculated from the following:   Height as of this encounter: 6\' 1"  (1.854 m).  Weight as of this encounter: 95.7 kg. Advance diet Up with therapy D/C IV fluids   Patient's anticipated LOS is less than 2 midnights, meeting these requirements: - Lives within 1 hour of care - Has a competent adult at home to recover with post-op recover - NO history of  - Chronic pain requiring opiods  - Diabetes  - Coronary Artery Disease  - Heart failure  - Heart attack  - Stroke  - DVT/VTE  - Respiratory Failure/COPD  - Renal failure  - Anemia  - Advanced Liver disease  DVT Prophylaxis -  Eliquis Weight bearing as tolerated. Continue therapy.  Plan is to go Home after hospital stay. Plan for discharge later today if progresses with therapy and meeting goals. Scheduled for OPPT  at [facility]. Follow-up in the office in 2 weeks.  The PDMP database was reviewed today prior to any opioid medications being prescribed to this patient.  Arther Abbott, PA-C Orthopedic Surgery [phone] 06/17/2023, 8:31 AM

## 2023-06-17 NOTE — Care Management Obs Status (Signed)
 MEDICARE OBSERVATION STATUS NOTIFICATION   Patient Details  Name: Sean Stanley MRN: [id number] Date of Birth: [birth year]   Medicare Observation Status Notification Given:  Hart Robinsons, LCSW 06/17/2023, 11:31 AM

## 2023-06-17 NOTE — Progress Notes (Signed)
 Report given to Danielle at [facility]. Patient to be transported via private vehicle.

## 2023-06-17 NOTE — TOC Transition Note (Signed)
 Transition of Care [facility]) - Discharge Note   Patient Details  Name: Sean Stanley MRN: [id number] Date of Birth: 1940/10/20  Transition of Care [facility]) CM/SW Contact:  Amada Jupiter, LCSW Phone Number: 06/17/2023, 12:39 PM   Clinical Narrative:     Met with pt/ family and have confirmed dc plan is for pt to dc to skilled rehab unit at [facility] ([place]ives in [place]).  Confirmed with social worker at [facility] that rehab bed is ready today for pt.  Family will provide dc transportation.  RN to call report to [phone].  MD has cleared pt for dc today.  No further TOC needs.  Final next level of care: Skilled Nursing Facility Barriers to Discharge: Barriers Resolved   Patient Goals and CMS Choice Patient states their goals for this hospitalization and ongoing recovery are:: [place] at [facility] [place]          Discharge Placement              Patient chooses bed at: [facility] Patient to be transferred to facility by: family      Discharge Plan and Services Additional resources added to the After Visit Summary for                  DME Arranged: N/A DME Agency: NA                  Social Drivers of Health (SDOH) Interventions SDOH Screenings   Food Insecurity: No Food Insecurity (06/16/2023)  Housing: Low Risk  (06/16/2023)  Transportation Needs: No Transportation Needs (06/16/2023)  Utilities: Not At Risk (06/16/2023)  Depression (PHQ2-9): Low Risk  (03/04/2023)  Social Connections: Patient Declined (06/16/2023)  Tobacco Use: Low Risk  (06/16/2023)     Readmission Risk Interventions     No data to display

## 2023-06-17 NOTE — Progress Notes (Signed)
 Physical Therapy Treatment Patient Details Name: Sean Stanley MRN: [id number] DOB: 12-31-1940 Today's Date: 06/17/2023   History of Present Illness 83 yo male s/p RTKA on 06/16/23. PMH: NHL, BPH, chronic pain, afib, peripheral neuropathy, OSA, mitral valve repair    PT Comments  Pt making excellent progress toward PT goals. Improving knee ROM and incr gait distance. Pt is ready to d/c from PT standpoint, plan is for SNF at [facility]. Will continue to follow in acute setting     If plan is discharge home, recommend the following: A little help with bathing/dressing/bathroom;Assist for transportation;Help with stairs or ramp for entrance   Can travel by private vehicle        Equipment Recommendations  None recommended by PT    Recommendations for Other Services       Precautions / Restrictions Precautions Precautions: Fall;Knee Recall of Precautions/Restrictions: Intact Required Braces or Orthoses: Knee Immobilizer - Right Knee Immobilizer - Right: Discontinue once straight leg raise with < 10 degree lag Restrictions Weight Bearing Restrictions Per Provider Order: No Other Position/Activity Restrictions: WBAT     Mobility  Bed Mobility               General bed mobility comments: in recliner    Transfers Overall transfer level: Needs assistance Equipment used: Rolling walker (2 wheels) Transfers: Sit to/from Stand Sit to Stand: Contact guard assist, Supervision           General transfer comment: cues for hand placement and RLE position    Ambulation/Gait Ambulation/Gait assistance: Contact guard assist Gait Distance (Feet): 120 Feet Assistive device: Rolling walker (2 wheels) Gait Pattern/deviations: Step-to pattern       General Gait Details: cues for sequence and RW position   Stairs             Wheelchair Mobility     Tilt Bed    Modified Rankin (Stroke Patients Only)       Balance                                             Communication Communication Communication: No apparent difficulties  Cognition Arousal: Alert Behavior During Therapy: WFL for tasks assessed/performed   PT - Cognitive impairments: No apparent impairments                         Following commands: Intact      Cueing    Exercises Total Joint Exercises Ankle Circles/Pumps: AROM, Both, 10 reps Quad Sets: AROM, Both, 10 reps Heel Slides: AAROM, Right, 10 reps Straight Leg Raises: AROM, AAROM, Strengthening, Right, 10 reps    General Comments        Pertinent Vitals/Pain Pain Assessment Pain Assessment: Faces Faces Pain Scale: Hurts little more Pain Location: right knee Pain Descriptors / Indicators: Discomfort, Sore Pain Intervention(s): Limited activity within patient's tolerance, Monitored during session, Premedicated before session, Repositioned, Ice applied    Home Living                          Prior Function            PT Goals (current goals can now be found in the care plan section) Acute Rehab PT Goals PT Goal Formulation: With patient Time For Goal Achievement: 06/23/23 Potential to Achieve Goals: Good Progress  towards PT goals: Progressing toward goals    Frequency    7X/week      PT Plan      Co-evaluation              AM-PAC PT "6 Clicks" Mobility   Outcome Measure  Help needed turning from your back to your side while in a flat bed without using bedrails?: A Little Help needed moving from lying on your back to sitting on the side of a flat bed without using bedrails?: A Little Help needed moving to and from a bed to a chair (including a wheelchair)?: A Little Help needed standing up from a chair using your arms (e.g., wheelchair or bedside chair)?: A Little Help needed to walk in hospital room?: A Little Help needed climbing 3-5 steps with a railing? : A Little 6 Click Score: 18    End of Session Equipment Utilized During Treatment: Gait  belt Activity Tolerance: Patient tolerated treatment well Patient left: in chair;with call bell/phone within reach;with chair alarm set Nurse Communication: Mobility status PT Visit Diagnosis: Other abnormalities of gait and mobility (R26.89)     Time: 6578-4696 PT Time Calculation (min) (ACUTE ONLY): 23 min  Charges:    $Gait Training: 8-22 mins $Therapeutic Exercise: 8-22 mins PT General Charges $$ ACUTE PT VISIT: 1 Visit                     Delice Bison, PT  Acute Rehab Dept [facility]) [phone]  06/17/2023    [facility] 06/17/2023, 10:27 AM

## 2023-06-17 NOTE — Discharge Summary (Signed)
 Physician Discharge Summary   Patient ID: Sean Stanley MRN: [id number] DOB/AGE: 1941-02-14 83 y.o.  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 [facility])    being followed at [facility]   Aneurysm of splenic artery (HCC) 2014   s/p coiling 12/16/12 - [facility]   Atrial flutter (HCC)    BPH (benign prostatic hyperplasia)    Cancer (HCC)    melanoma on lower right back and left chest - surgically removed and cleared   Difficult intubation    Dysrhythmia    H/O agent Orange exposure    Headache    migraine- not current   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 Earl Gala told him he had improved   Paroxysmal atrial fibrillation (HCC) 01/02/2021   Pneumonia    Thoracic ascending aortic aneurysm (HCC)    4.5 cm 03/2018 CT. 4.4 cm on echo 10/2021 and on CTA 03/2021   Thyroid cancer [facility])    Discharge Diagnoses:   Principal Problem:   OA (osteoarthritis) of knee Active Problems:   Primary osteoarthritis of right knee  Estimated body mass index is 27.84 kg/m as calculated from the following:   Height as of this encounter: 6\' 1"  (1.854 m).   Weight as of this encounter: 95.7 kg.  Procedure:  Procedure(s) (LRB): ARTHROPLASTY, KNEE, TOTAL (Right)    Consults: None  HPI: Sean Stanley is a 83 y.o. year old male with end stage OA of his right knee with progressively worsening pain and dysfunction. He has constant pain, with activity and at rest and significant functional deficits with difficulties even with ADLs. He has had extensive non-op management including analgesics, injections of cortisone and viscosupplements, and home exercise program, but remains in significant pain with significant dysfunction. Radiographs show bone on bone arthritis medial and patellofemoral. He presents now for right Total Knee Arthroplasty.     Laboratory Data: Admission on 06/16/2023  Component Date Value Ref Range Status   WBC 06/17/2023 11.6 (H)  4.0 - 10.5 K/uL Final   RBC 06/17/2023 3.82 (L)  4.22 - 5.81 MIL/uL Final   Hemoglobin 06/17/2023 11.8 (L)  13.0 - 17.0 g/dL Final   HCT 78/29/5621 36.6 (L)  39.0 - 52.0 % Final   MCV 06/17/2023 95.8  80.0 - 100.0 fL Final   MCH 06/17/2023 30.9  26.0 - 34.0 pg Final   MCHC 06/17/2023 32.2  30.0 - 36.0 g/dL Final   RDW 30/86/5784 12.9  11.5 - 15.5 % Final   Platelets 06/17/2023 175  150 - 400 K/uL Final   nRBC 06/17/2023 0.0  0.0 - 0.2 % Final   Performed at [facility], [address]  [place], [place] [address]   Sodium 06/17/2023 135  135 - 145 mmol/L Final   Potassium 06/17/2023 4.3  3.5 - 5.1 mmol/L Final   Chloride 06/17/2023 102  98 - 111 mmol/L Final   CO2 06/17/2023 26  22 - 32 mmol/L Final   Glucose, Bld 06/17/2023 121 (H)  70 - 99 mg/dL Final   Glucose reference range applies only to samples taken after fasting for at least 8 hours.   BUN 06/17/2023 22  8 - 23 mg/dL Final   Creatinine, Ser 06/17/2023 1.02  0.61 - 1.24 mg/dL Final   Calcium 65/78/4696 8.5 (L)  8.9 - 10.3 mg/dL Final   GFR, Estimated 06/17/2023 >60  >60 mL/min Final   Comment: (NOTE) Calculated using the CKD-EPI Creatinine Equation (2021)    Anion gap 06/17/2023 7  5 - 15 Final   Performed at [facility], [address] [place], [place] [address]  Hospital Outpatient Visit on 06/09/2023  Component Date Value Ref Range Status   MRSA, PCR 06/09/2023 NEGATIVE  NEGATIVE Final   Staphylococcus aureus 06/09/2023 NEGATIVE  NEGATIVE Final   Comment: (NOTE) The Xpert SA Assay (FDA approved for NASAL specimens in patients 81 years of age and older), is one component of a comprehensive surveillance program. It is not intended to diagnose infection nor to guide or monitor treatment. Performed at [facility], [address], [place], [place] [address]    Sodium 06/09/2023 137  135 - 145 mmol/L Final   Potassium 06/09/2023 4.3  3.5 - 5.1 mmol/L Final   Chloride 06/09/2023 105  98 - 111 mmol/L Final   CO2 06/09/2023 26  22 - 32 mmol/L Final   Glucose, Bld 06/09/2023 90  70 - 99 mg/dL Final   Glucose reference range applies only to samples taken after fasting for at least 8 hours.   BUN 06/09/2023 26 (H)  8 - 23 mg/dL Final   Creatinine, Ser 06/09/2023 1.13  0.61 - 1.24 mg/dL Final   Calcium 40/12/2723 9.0  8.9 - 10.3 mg/dL Final   GFR, Estimated 06/09/2023 >60  >60 mL/min Final   Comment: (NOTE) Calculated using the CKD-EPI Creatinine Equation (2021)    Anion gap 06/09/2023 6  5 - 15 Final   Performed at [facility], [address], [place], [place] [address]   WBC 06/09/2023 6.9  4.0 - 10.5 K/uL Final   RBC 06/09/2023 4.17 (L)  4.22 - 5.81 MIL/uL Final   Hemoglobin 06/09/2023 12.6 (L)  13.0 - 17.0 g/dL Final   HCT 03/47/4259 40.2  39.0 - 52.0 % Final   MCV 06/09/2023 96.4  80.0 - 100.0 fL Final   MCH 06/09/2023 30.2  26.0 - 34.0 pg Final   MCHC 06/09/2023 31.3  30.0 - 36.0 g/dL Final   RDW 56/38/7564 12.7  11.5 - 15.5 % Final   Platelets 06/09/2023 209  150 - 400 K/uL Final   nRBC 06/09/2023 0.0  0.0 - 0.2 % Final   Performed at [facility], [address] [place], [place] [address]  Clinical Support on  06/09/2023  Component Date Value Ref Range Status   Date Time Interrogation Session 06/08/[phone] [id number]   Final   Pulse Generator Manufacturer 06/08/2023 MERM   Final   Pulse Gen Model 06/08/2023 [id number] II   Final   Pulse Gen Serial Number 06/08/2023 [id number]   Final   Clinic Name 06/08/2023 [facility]   Final  Implantable Pulse Generator Type 06/08/2023 ICM/ILR   Final   Implantable Pulse Generator Implan* 06/08/2023 [id number]   Final   Eval Rhythm 06/08/2023 SR with PAC's   Final  Clinical Support on 05/05/2023  Component Date Value Ref Range Status   Date Time Interrogation Session 05/04/2023 [id number]   Final   Pulse Generator Manufacturer 05/04/2023 MERM   Final   Pulse Gen Model 05/04/2023 [id number] II   Final   Pulse Gen Serial Number 05/04/2023 [id number]   Final   Clinic Name 05/04/2023 [facility]   Final   Implantable Pulse Generator Type 05/04/2023 ICM/ILR   Final   Implantable Pulse Generator Implan* 05/04/2023 [id number]   Final     X-Rays:CUP PACEART REMOTE DEVICE CHECK Result Date: 06/10/2023 ILR summary report received. Battery status OK. Normal device function. No new symptom, tachy, brady, or pause episodes. No new AF episodes. Monthly summary reports and ROV/PRN LA, CVRS   EKG: Orders placed or performed in visit on 05/26/23   EKG 12-Lead     Hospital Course: Sean Stanley is a 83 y.o. who was admitted to [facility]. They were brought to the operating room on 06/16/2023 and underwent Procedure(s): ARTHROPLASTY, KNEE, TOTAL.  Patient tolerated the procedure well and was later transferred to the recovery room and then to the orthopaedic floor for postoperative care. They were given PO and IV analgesics for pain control following their surgery. They were given 24 hours of postoperative antibiotics of  Anti-infectives (From admission, onward)    Start     Dose/Rate Route Frequency Ordered Stop   06/16/23 1300  ceFAZolin (ANCEF) IVPB  2g/100 mL premix        2 g 200 mL/hr over 30 Minutes Intravenous Every 6 hours 06/16/23 1037 06/16/23 1849   06/16/23 0600  ceFAZolin (ANCEF) IVPB 2g/100 mL premix        2 g 200 mL/hr over 30 Minutes Intravenous On call to O.R. 06/16/23 0544 06/16/23 0725   06/16/23 0550  ceFAZolin (ANCEF) 2-4 GM/100ML-% IVPB       Note to Pharmacy: [facility]: cabinet override      06/16/23 0550 06/16/23 0729      and started on DVT prophylaxis in the form of  Eliquis .   PT and OT were ordered for total joint protocol. Discharge planning consulted to help with postop disposition and equipment needs.  Patient had a good night on the evening of surgery. They started to get up OOB with therapy on POD #0. Pt was seen during rounds and was ready to dc pending bed availability at SNF [facility]. He worked with therapy on POD #1 and was meeting his goals. Pt was discharged to SNF later that day in stable condition.  Diet: Cardiac diet Activity: WBAT Follow-up: in 2 weeks Disposition: Skilled nursing facility Discharged Condition: stable   Discharge Instructions     Call MD / Call 911   Complete by: As directed    If you experience chest pain or shortness of breath, CALL 911 and be transported to the hospital emergency room.  If you develope a fever above 101 F, pus (white drainage) or increased drainage or redness at the wound, or calf pain, call your surgeon's office.   Change dressing   Complete by: As directed    You may remove the bulky bandage (ACE wrap and gauze) two days after surgery. You will have an adhesive waterproof bandage underneath. Leave this in place  until your first follow-up appointment.   Constipation Prevention   Complete by: As directed    Drink plenty of fluids.  Prune juice may be helpful.  You may use a stool softener, such as Colace (over the counter) 100 mg twice a day.  Use MiraLax (over the counter) for constipation as needed.   Diet - low sodium heart  healthy   Complete by: As directed    Do not put a pillow under the knee. Place it under the heel.   Complete by: As directed    Driving restrictions   Complete by: As directed    No driving for two weeks   Post-operative opioid taper instructions:   Complete by: As directed    POST-OPERATIVE OPIOID TAPER INSTRUCTIONS: It is important to wean off of your opioid medication as soon as possible. If you do not need pain medication after your surgery it is ok to stop day one. Opioids include: Codeine, Hydrocodone(Norco, Vicodin), Oxycodone(Percocet, oxycontin) and hydromorphone amongst others.  Long term and even short term use of opiods can cause: Increased pain response Dependence Constipation Depression Respiratory depression And more.  Withdrawal symptoms can include Flu like symptoms Nausea, vomiting And more Techniques to manage these symptoms Hydrate well Eat regular healthy meals Stay active Use relaxation techniques(deep breathing, meditating, yoga) Do Not substitute Alcohol to help with tapering If you have been on opioids for less than two weeks and do not have pain than it is ok to stop all together.  Plan to wean off of opioids This plan should start within one week post op of your joint replacement. Maintain the same interval or time between taking each dose and first decrease the dose.  Cut the total daily intake of opioids by one tablet each day Next start to increase the time between doses. The last dose that should be eliminated is the evening dose.      TED hose   Complete by: As directed    Use stockings (TED hose) for three weeks on both leg(s).  You may remove them at night for sleeping.   Weight bearing as tolerated   Complete by: As directed       Allergies as of 06/17/2023       Reactions   Rosuvastatin Other (See Comments)   Muscle/chest pain/Myalgias Currently taking M-W-F in the evening   Aspirin Other (See Comments)   Has been instructed  not to take any blood thinners even aspirin due to history of several aneurysms   Levaquin [levofloxacin In D5w]    tendonitis   Levofloxacin Other (See Comments)   Itching, tendonitis   Pravastatin Other (See Comments)   Muscle aches   Simvastatin Other (See Comments)   Chest pain Other Reaction(s): myalgias   Allopurinol Rash   Ampicillin Rash   Did it involve swelling of the face/tongue/throat, SOB, or low BP? No Did it involve sudden or severe rash/hives, skin peeling, or any reaction on the inside of your mouth or nose? Yes Did you need to seek medical attention at a hospital or doctor's office? Yes When did it last happen?      50+ years If all above answers are "NO", may proceed with cephalosporin use. Other Reaction(s): Not available   Nickel Itching, Rash   Penicillin G Rash   Tolerated Cephalosporin Date: 06/17/23.        Medication List     STOP taking these medications    Comirnaty syringe Generic drug: COVID-19 mRNA  vaccine Proofreader)       TAKE these medications    acetaminophen 500 MG tablet Commonly known as: TYLENOL Take 1,000 mg by mouth daily as needed for moderate pain.   alfuzosin 10 MG 24 hr tablet Commonly known as: UROXATRAL Take 10 mg by mouth every evening.   apixaban 5 MG Tabs tablet Commonly known as: Eliquis Take 1 tablet (5 mg total) by mouth 2 (two) times daily.   buPROPion 150 MG 24 hr tablet Commonly known as: WELLBUTRIN XL Take 1 tablet (150 mg total) by mouth daily.   ezetimibe 10 MG tablet Commonly known as: ZETIA Take 1 tablet (10 mg total) by mouth every morning. What changed: when to take this   finasteride 5 MG tablet Commonly known as: PROSCAR Take 5 mg by mouth every evening.   levothyroxine 150 MCG tablet Commonly known as: SYNTHROID Take 150 mcg by mouth daily before breakfast. 1 hour before breakfast   methocarbamol 500 MG tablet Commonly known as: ROBAXIN Take 1 tablet (500 mg total) by mouth every 6  (six) hours as needed for muscle spasms.   metoprolol succinate 25 MG 24 hr tablet Commonly known as: TOPROL-XL Take 12.5 mg by mouth in the morning.   ondansetron 4 MG tablet Commonly known as: ZOFRAN Take 1 tablet (4 mg total) by mouth every 6 (six) hours as needed for nausea.   oxyCODONE 5 MG immediate release tablet Commonly known as: Oxy IR/ROXICODONE Take 1-2 tablets (5-10 mg total) by mouth every 6 (six) hours as needed for severe pain (pain score 7-10).   rosuvastatin 5 MG tablet Commonly known as: CRESTOR Take 10 mg by mouth every Monday, Wednesday, and Friday. In the evening   sodium chloride 0.65 % Soln nasal spray Commonly known as: OCEAN Place 2 sprays into both nostrils at bedtime.   SYSTANE HYDRATION PF OP Place 1 drop into both eyes 3 (three) times daily as needed (Dry eyes).   traMADol 50 MG tablet Commonly known as: ULTRAM Take 1-2 tablets (50-100 mg total) by mouth every 6 (six) hours as needed for moderate pain (pain score 4-6).   Vitamin D 50 MCG (2000 UT) tablet Take 2,000 Units by mouth at bedtime.               Discharge Care Instructions  (From admission, onward)           Start     Ordered   06/17/23 0000  Weight bearing as tolerated        06/17/23 0833   06/17/23 0000  Change dressing       Comments: You may remove the bulky bandage (ACE wrap and gauze) two days after surgery. You will have an adhesive waterproof bandage underneath. Leave this in place until your first follow-up appointment.   06/17/23 4098            Contact information for follow-up providers     Ollen Gross, MD. Go on 07/02/2023.   Specialty: Orthopedic Surgery Why: You have a post op appointment Wednesday 07/02/23 at 3:00pm Contact information: [address] [place] [address] [phone]              Contact information for after-discharge care     Destination     [facility] .    Service: Skilled Paramedic information: [address] [place] [address] [phone]  Signed: Arther Abbott, PA-C Orthopedic Surgery 06/17/2023, 12:08 PM

## 2023-06-17 NOTE — [place] FL2 (Signed)
 [place] MEDICAID FL2 LEVEL OF CARE FORM     IDENTIFICATION  Patient Name: RISHAV ROCKEFELLER Birthdate: September 03, 1940 Sex: male Admission Date (Current Location): 06/16/2023  [facility] and [place] Number:  Producer, television/film/video and Address:  [facility],  [address] 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   Open angle with borderline findings, low risk, bilateral 05/22/2023   Non-Hodgkin lymphoma, unspecified, unspecified site (HCC) 05/22/2023   Exposure to potentially hazardous substance 05/22/2023   Benign prostatic hyperplasia without urinary obstruction 05/22/2023   History of non-Hodgkin's lymphoma 05/22/2023   Encounter for medication management 04/29/2023   Chronic pain syndrome 01/01/2023   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   Vitreous degeneration 12/26/2022   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   Ptosis of eyelid 12/26/2022   Open-angle glaucoma 12/26/2022   Dry eyes 12/26/2022   Dermatochalasis of right eyelid 12/26/2022   Dermatochalasis of left eyelid 12/26/2022   Pseudophakia 12/26/2022   Hypotension 04/11/2022   Hypercoagulable state due to paroxysmal atrial fibrillation (HCC) 05/28/2021   Paroxysmal atrial fibrillation (HCC) 01/02/2021   Coronary atherosclerosis 11/16/2020   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   Agent orange exposure 05/17/2020   Ascending aortic  aneurysm (HCC) 11/23/2019   Peripheral sensory neuropathy 11/17/2019   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   Deficiency anemia 04/19/2019   Vitamin D deficiency 04/19/2019   Hypocalcemia 04/19/2019   Speech and language deficits 02/22/2019   Vocal fold paralysis, 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   Thyroid cancer (HCC) 11/26/2018   Hyperlipidemia 11/03/2018   Neck discomfort 09/23/2018   Other constipation 08/26/2018   Pancytopenia, acquired (HCC) 07/28/2018   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   Cervical lymphadenopathy 04/10/2018   Insomnia secondary to chronic pain 10/28/2017   OSA (obstructive sleep apnea) 10/28/2017   OSA on CPAP 07/23/2017   Neuropathy 07/23/2017   RLS (restless legs syndrome) 07/23/2017   Chronic diastolic (congestive) heart failure (HCC) 10/27/2013   Long term (current) use of anticoagulants 06/24/2013   S/P mitral valve repair 04/20/2013   History of atrial flutter 04/20/2013   Gallstones 03/30/2013   Palpitations 02/16/2013   Red blood cell antibody positive, compatible PRBC difficult to obtain 12/21/2012   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   Pain in limb-Abdominal 09/21/2012   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   Nontraumatic retroperitoneal hematoma, spontaneous while on anticoagulant therapy 08/29/2012   Acute post-hemorrhagic anemia 08/29/2012   Acute GI bleeding, spontaneous  08/06/2012   Atrial flutter, status post TE guided cardioversion November 2013 08/06/2012   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/ring by Dr Silvestre Mesi at [facility] '02 08/06/2012   Normal coronary arteries at cath 2006 (false positive Nuc) 08/06/2012   PVD, < 49% carotid, 50% RSCA 08/06/2012   Acute renal insufficiency 08/06/2012   Hemorrhagic shock- now stable 08/04/2012   Bradycardia- HR 58 NSR on admission 08/04/12 08/04/2012  Orientation RESPIRATION BLADDER Height & Weight     Self, Time,  Situation, Place  Normal Continent Weight: 211 lb (95.7 kg) Height:  6\' 1"  (185.4 cm)  BEHAVIORAL SYMPTOMS/MOOD NEUROLOGICAL BOWEL NUTRITION STATUS      Continent Diet (regular)  AMBULATORY STATUS COMMUNICATION OF NEEDS Skin   Limited Assist Verbally Other (Comment) (surgical incision only)                       Personal Care Assistance Level of Assistance  Bathing, Dressing Bathing Assistance: Limited assistance   Dressing Assistance: Limited assistance     Functional Limitations Info  Sight, Hearing, Speech Sight Info: Adequate Hearing Info: Adequate Speech Info: Adequate    SPECIAL CARE FACTORS FREQUENCY  PT (By licensed PT), OT (By licensed OT)     PT Frequency: 5x/wk OT Frequency: 5x/wk            Contractures Contractures Info: Not present    Additional Factors Info  Code Status, Allergies Code Status Info: Full Allergies Info: Rosuvastatin, Aspirin, Levaquin (Levofloxacin In D5w), Levofloxacin, Pravastatin, Simvastatin, Allopurinol, Ampicillin, Nickel, Penicillin G           Current Medications (06/17/2023):  This is the current hospital active medication list Current Facility-Administered Medications  Medication Dose Route Frequency Provider Last Rate Last Admin   0.9 %  sodium chloride infusion   Intravenous Continuous Eveland, Casimiro Needle, PA-C   Stopped at 06/17/23 0700   acetaminophen (TYLENOL) tablet 1,000 mg  1,000 mg Oral Q6H Cory Munch, PA-C   1,000 mg at 06/17/23 0810   acetaminophen (TYLENOL) tablet 325-650 mg  325-650 mg Oral Q6H PRN Cory Munch, PA-C       alfuzosin (UROXATRAL) 24 hr tablet 10 mg  10 mg Oral QPM Cory Munch, PA-C   10 mg at 06/16/23 1816   apixaban (ELIQUIS) tablet 2.5 mg  2.5 mg Oral Q12H Cory Munch, PA-C   2.5 mg at 06/17/23 1610   bisacodyl (DULCOLAX) suppository 10 mg  10 mg Rectal Daily PRN Cory Munch, PA-C       buPROPion (WELLBUTRIN XL) 24 hr  tablet 150 mg  150 mg Oral Daily Weston Brass [place], PA-C   150 mg at 06/17/23 9604   diphenhydrAMINE (BENADRYL) 12.5 MG/5ML elixir 12.5-25 mg  12.5-25 mg Oral Q4H PRN Cory Munch, PA-C       docusate sodium (COLACE) capsule 100 mg  100 mg Oral BID Cory Munch, PA-C   100 mg at 06/17/23 5409   ezetimibe (ZETIA) tablet 10 mg  10 mg Oral QPM Cory Munch, PA-C   10 mg at 06/16/23 1816   finasteride (PROSCAR) tablet 5 mg  5 mg Oral QPM Cory Munch, PA-C   5 mg at 06/16/23 1816   levothyroxine (SYNTHROID) tablet 150 mcg  150 mcg Oral QAC breakfast Weston Brass [place], PA-C   150 mcg at 06/17/23 8119   menthol-cetylpyridinium (CEPACOL) lozenge 3 mg  1 lozenge Oral PRN Cory Munch, PA-C       Or   phenol (CHLORASEPTIC) mouth spray 1 spray  1 spray Mouth/Throat PRN Cory Munch, PA-C       methocarbamol (ROBAXIN) tablet 500 mg  500 mg Oral Q6H PRN Cory Munch, PA-C   500 mg at 06/17/23 1478   Or   methocarbamol (ROBAXIN) injection 500 mg  500 mg Intravenous Q6H PRN Cory Munch, PA-C  metoCLOPramide (REGLAN) tablet 5-10 mg  5-10 mg Oral Q8H PRN Cory Munch, PA-C       Or   metoCLOPramide (REGLAN) injection 5-10 mg  5-10 mg Intravenous Q8H PRN Cory Munch, PA-C       metoprolol succinate (TOPROL-XL) 24 hr tablet 12.5 mg  12.5 mg Oral q AM Cory Munch, PA-C   12.5 mg at 06/17/23 2956   morphine (PF) 2 MG/ML injection 1-2 mg  1-2 mg Intravenous Q2H PRN Cory Munch, PA-C       ondansetron [facility]) tablet 4 mg  4 mg Oral Q6H PRN Cory Munch, PA-C       Or   ondansetron [facility]) injection 4 mg  4 mg Intravenous Q6H PRN Cory Munch, PA-C   4 mg at 06/16/23 2017   oxyCODONE (Oxy IR/ROXICODONE) immediate release tablet 10-15 mg  10-15 mg Oral Q4H PRN Cory Munch, PA-C   10 mg at 06/16/23 1456   oxyCODONE  (Oxy IR/ROXICODONE) immediate release tablet 5-10 mg  5-10 mg Oral Q4H PRN Cory Munch, PA-C   10 mg at 06/17/23 2130   polyethylene glycol (MIRALAX / GLYCOLAX) packet 17 g  17 g Oral Daily PRN Cory Munch, PA-C   17 g at 06/16/23 1907   [START ON 06/18/2023] rosuvastatin (CRESTOR) tablet 10 mg  10 mg Oral Q M,W,F Eveland, Stephanie Zeman, PA-C       sodium chloride (OCEAN) 0.65 % nasal spray 2 spray  2 spray Each Nare QHS Eveland, Casimiro Needle, PA-C       sodium phosphate (FLEET) enema 1 enema  1 enema Rectal Once PRN Cory Munch, PA-C       traMADol Janean Sark) tablet 50-100 mg  50-100 mg Oral Q6H PRN Cory Munch, PA-C   100 mg at 06/16/23 1318     Discharge Medications: Please see discharge summary for a list of discharge medications.  Relevant Imaging Results:  Relevant Lab Results:   Additional Information SS# [id number]  Amada Jupiter, LCSW

## 2023-06-18 DIAGNOSIS — R2689 Other abnormalities of gait and mobility: Secondary | ICD-10-CM | POA: Diagnosis not present

## 2023-06-18 DIAGNOSIS — R531 Weakness: Secondary | ICD-10-CM | POA: Diagnosis not present

## 2023-06-18 DIAGNOSIS — Z471 Aftercare following joint replacement surgery: Secondary | ICD-10-CM | POA: Diagnosis not present

## 2023-06-18 DIAGNOSIS — R278 Other lack of coordination: Secondary | ICD-10-CM | POA: Diagnosis not present

## 2023-06-18 DIAGNOSIS — M62561 Muscle wasting and atrophy, not elsewhere classified, right lower leg: Secondary | ICD-10-CM | POA: Diagnosis not present

## 2023-06-18 DIAGNOSIS — M25561 Pain in right knee: Secondary | ICD-10-CM | POA: Diagnosis not present

## 2023-06-18 DIAGNOSIS — M1711 Unilateral primary osteoarthritis, right knee: Secondary | ICD-10-CM | POA: Diagnosis not present

## 2023-06-19 ENCOUNTER — Encounter: Payer: Self-pay | Admitting: Adult Health

## 2023-06-19 ENCOUNTER — Non-Acute Institutional Stay (SKILLED_NURSING_FACILITY): Payer: Self-pay | Admitting: Adult Health

## 2023-06-19 DIAGNOSIS — Z96651 Presence of right artificial knee joint: Secondary | ICD-10-CM | POA: Diagnosis not present

## 2023-06-19 DIAGNOSIS — K5909 Other constipation: Secondary | ICD-10-CM

## 2023-06-19 DIAGNOSIS — R278 Other lack of coordination: Secondary | ICD-10-CM | POA: Diagnosis not present

## 2023-06-19 DIAGNOSIS — M62561 Muscle wasting and atrophy, not elsewhere classified, right lower leg: Secondary | ICD-10-CM | POA: Diagnosis not present

## 2023-06-19 DIAGNOSIS — R531 Weakness: Secondary | ICD-10-CM | POA: Diagnosis not present

## 2023-06-19 DIAGNOSIS — I10 Essential (primary) hypertension: Secondary | ICD-10-CM | POA: Diagnosis not present

## 2023-06-19 DIAGNOSIS — R2689 Other abnormalities of gait and mobility: Secondary | ICD-10-CM | POA: Diagnosis not present

## 2023-06-19 DIAGNOSIS — M1711 Unilateral primary osteoarthritis, right knee: Secondary | ICD-10-CM | POA: Diagnosis not present

## 2023-06-19 DIAGNOSIS — I480 Paroxysmal atrial fibrillation: Secondary | ICD-10-CM | POA: Diagnosis not present

## 2023-06-19 DIAGNOSIS — M25561 Pain in right knee: Secondary | ICD-10-CM | POA: Diagnosis not present

## 2023-06-19 NOTE — Progress Notes (Addendum)
 Location:  Medical illustrator of Service:  SNF (31) Provider:   Peggye Ley, ANP [facility] [phone] Aloe, Bryson Ha, MD  Patient Care Team: [name], MD as PCP - General (Internal Medicine) Regan Lemming, MD as PCP - Electrophysiology (Cardiology) Wanita Chamberlain, MD as Referring Physician (Hematology and Oncology)  Extended Emergency Contact Information Primary Emergency Contact: Stokely,Nancy Address: [address]          [place], [place] [address] [place] of [place] Home Phone: [phone] Mobile Phone: [phone] Relation: Spouse Secondary Emergency Contact: Grant,Jennifer Home Phone: [phone] Mobile Phone: [phone] Relation: Daughter  Goals of care: Advanced Directive information    06/16/2023    5:46 AM  Advanced Directives  Does Patient Have a Medical Advance Directive? Yes  Type of Estate agent of [place];Living will  Does patient want to make changes to medical advance directive? No - Patient declined  Copy of Healthcare Power of Attorney in Chart? Yes - validated most recent copy scanned in chart (See row information)     Chief Complaint  Patient presents with  . Acute Visit    S/p hosp    HPI:  The patient is an 83 year old male who presents for skilled rehabilitation and physical therapy following a total knee arthroplasty.  Significant PMH with mitral valve repair, thyroid cancer s/p thyroidectomy, vocal cord paralysis, splenic artery aneurysm, RLS, neuropathy, afib, HLD, HTN, BPH, PVD, non hodgkins lymphoma.   He underwent a total knee arthroplasty on June 16, 2023, due to advanced osteoarthritis of the right knee. Prior to the surgery, he was living independently.  He experiences pain after therapy sessions but feels it is fairly well controlled with tylenol and oxycodone.   He is experiencing constipation despite using Miralax,  Colace, and Milk of Magnesia. He reports having a bowel movement, but it was very difficult.  Evaluated by cardiology prior to surgery 05/26/23 and was noted to be in normal sinus rhythm with an ejection fraction of 50-55%. His hypertension is controlled while in the rehab facility.  Past Medical History:  Diagnosis Date  . 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 Aneurysm artery, celiac (HCC)    followed at [facility]  . 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 Aneurysm of splenic artery (HCC) 2014   s/p coiling 12/16/12 - [facility]  . Atrial flutter (HCC)   . BPH (benign prostatic hyperplasia)   . Cancer (HCC)    melanoma on lower right back and left chest - surgically removed and cleared  . Difficult intubation   . Dysrhythmia   . 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 Headache    migraine- not current  . 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 History of blood transfusion   . Hypercholesteremia   . Hypercholesterolemia   . Lymphoma (HCC) 04/2018  . Mitral valve disease    annuloplasty 2002 [facility]  . Neuropathy   . Neuropathy of both feet    pt states due to exposure to [employer]  . OSA (obstructive sleep apnea)    does not use cpap, Dr. Earl Gala told him he had improved  . Paroxysmal atrial fibrillation (HCC) 01/02/2021  . Pneumonia   .  Thoracic ascending aortic aneurysm (HCC)    4.5 cm 03/2018 CT. 4.4 cm on echo 10/2021 and on CTA 03/2021  . Thyroid cancer [facility])    Past Surgical History:  Procedure Laterality Date  . ABDOMINAL ANGIOGRAM  08/05/2012  . aneurysm repair    . ATRIAL FIBRILLATION ABLATION N/A 10/08/2022   Procedure: ATRIAL FIBRILLATION ABLATION;  Surgeon: Regan Lemming, MD;  Location: [facility] INVASIVE CV LAB;  Service: Cardiovascular;   Laterality: N/A;  . CARDIOVERSION  02/12/2012   Procedure: CARDIOVERSION;  Surgeon: [name], MD;  Location: [facility] ENDOSCOPY;  Service: Cardiovascular;  Laterality: N/A;  . CARDIOVERSION N/A 06/22/2013   Procedure: CARDIOVERSION;  Surgeon: Lars Masson, MD;  Location: [facility] ENDOSCOPY;  Service: Cardiovascular;  Laterality: N/A;  . CARDIOVERSION N/A 06/19/2021   Procedure: CARDIOVERSION;  Surgeon: Christell Constant, MD;  Location: [facility] ENDOSCOPY;  Service: Cardiovascular;  Laterality: N/A;  . CATARACT EXTRACTION Bilateral 2018   with lens implant  . CHOLECYSTECTOMY    . COLONOSCOPY    . ESOPHAGOGASTRODUODENOSCOPY    . implantable loop recorder placement  03/29/2021   Medtronic Reveal Linq model LNQ 22 [phone] G) implantable loop recorder  . IR IMAGING GUIDED PORT INSERTION  05/26/2018  . IR REMOVAL TUN ACCESS W/ PORT W/O FL MOD SED  02/05/2021  . LYMPH NODE BIOPSY Left 04/27/2018   Procedure: EXCISIONAL BIOPSY OF LEFT CERVICAL LYMPH NODE;  Surgeon: Christia Reading, MD;  [facility] OR;  Service: ENT;  Laterality: Left;  . LYMPH NODE BIOPSY Left 11/23/2018   Procedure: LEFT CERVICAL LYMPH NODE OPEN BIOPSY;  Surgeon: Christia Reading, MD;  Location: [facility] OR;  Service: ENT;  Laterality: Left;  . MENISCUS REPAIR Right 2009  . MITRAL VALVE REPAIR  2002   [facility]  . NM MYOVIEW LTD  07/22/2006   no ischemia  . RADICAL NECK DISSECTION Left 01/15/2019   Procedure: LEFT NECK DISSECTION;  Surgeon: Christia Reading, MD;  Location: [facility] OR;  Service: ENT;  Laterality: Left;  . RIGHT HEART CATH  06/19/2004   normal right heart dynamics. EF 50%  . TEE WITHOUT CARDIOVERSION  02/12/2012   Procedure: TRANSESOPHAGEAL ECHOCARDIOGRAM (TEE);  Surgeon: [name], MD;  Location: [facility] ENDOSCOPY;  Service: Cardiovascular;  Laterality: N/A;  . TEE WITHOUT CARDIOVERSION N/A 06/22/2013   Procedure: TRANSESOPHAGEAL ECHOCARDIOGRAM (TEE);  Surgeon: Lars Masson, MD;  Location: [facility] ENDOSCOPY;  Service:  Cardiovascular;  Laterality: N/A;  . THYROIDECTOMY N/A 01/15/2019   Procedure: TOTAL THYROIDECTOMY;  Surgeon: Christia Reading, MD;  Location: [facility] OR;  Service: ENT;  Laterality: N/A;  . TOTAL KNEE ARTHROPLASTY Right 06/16/2023   Procedure: ARTHROPLASTY, KNEE, TOTAL;  Surgeon: Ollen Gross, MD;  Location: [facility] ORS;  Service: Orthopedics;  Laterality: Right;    Allergies  Allergen Reactions  . Rosuvastatin Other (See Comments)    Muscle/chest pain/Myalgias Currently taking M-W-F in the evening  . Aspirin Other (See Comments)    Has been instructed not to take any blood thinners even aspirin due to history of several aneurysms    . Levaquin [Levofloxacin In D5w]     tendonitis  . Levofloxacin Other (See Comments)    Itching, tendonitis  . Pravastatin Other (See Comments)    Muscle aches  . Simvastatin Other (See Comments)    Chest pain  Other Reaction(s): myalgias  . Allopurinol Rash  . Ampicillin Rash    Did it involve swelling of the face/tongue/throat, SOB, or low BP? No  Did it involve sudden or severe rash/hives,  skin peeling, or any reaction on the inside of your mouth or nose? Yes  Did you need to seek medical attention at a hospital or doctor's office? Yes  When did it last happen?      50+ years  If all above answers are "NO", may proceed with cephalosporin use.  Other Reaction(s): Not available  . Nickel Itching and Rash  . Penicillin G Rash    Tolerated Cephalosporin Date: 06/17/23.      Outpatient Encounter Medications as of 06/19/2023  Medication Sig  . acetaminophen (TYLENOL) 500 MG tablet Take 1,000 mg by mouth daily as needed for moderate pain.  Marland Kitchen alfuzosin (UROXATRAL) 10 MG 24 hr tablet Take 10 mg by mouth every evening.  Marland Kitchen apixaban (ELIQUIS) 5 MG TABS tablet Take 1 tablet (5 mg total) by mouth 2 (two) times daily.  Marland Kitchen buPROPion (WELLBUTRIN XL) 150 MG 24 hr tablet Take 1 tablet (150 mg total) by mouth daily.  . Cholecalciferol (VITAMIN D) 2000 units tablet  Take 2,000 Units by mouth at bedtime.   Marland Kitchen ezetimibe (ZETIA) 10 MG tablet Take 1 tablet (10 mg total) by mouth every morning. (Patient taking differently: Take 10 mg by mouth every evening.)  . finasteride (PROSCAR) 5 MG tablet Take 5 mg by mouth every evening.  Marland Kitchen levothyroxine (SYNTHROID) 150 MCG tablet Take 150 mcg by mouth daily before breakfast. 1 hour before breakfast  . methocarbamol (ROBAXIN) 500 MG tablet Take 1 tablet (500 mg total) by mouth every 6 (six) hours as needed for muscle spasms.  . metoprolol succinate (TOPROL-XL) 25 MG 24 hr tablet Take 12.5 mg by mouth in the morning.  . ondansetron (ZOFRAN) 4 MG tablet Take 1 tablet (4 mg total) by mouth every 6 (six) hours as needed for nausea.  Marland Kitchen oxyCODONE (OXY IR/ROXICODONE) 5 MG immediate release tablet Take 1-2 tablets (5-10 mg total) by mouth every 6 (six) hours as needed for severe pain (pain score 7-10).  Bertram Gala Glycol-Propyl Glycol (SYSTANE HYDRATION PF OP) Place 1 drop into both eyes 3 (three) times daily as needed (Dry eyes).  . rosuvastatin (CRESTOR) 5 MG tablet Take 10 mg by mouth every Monday, Wednesday, and Friday. In the evening  . sodium chloride (OCEAN) 0.65 % SOLN nasal spray Place 2 sprays into both nostrils at bedtime.  . traMADol (ULTRAM) 50 MG tablet Take 1-2 tablets (50-100 mg total) by mouth every 6 (six) hours as needed for moderate pain (pain score 4-6).   No facility-administered encounter medications on file as of 06/19/2023.    Review of Systems  Immunization History  Administered Date(s) Administered  . Fluad Trivalent(High Dose 65+) 12/18/2022  . Influenza, High Dose Seasonal PF 12/29/2013, 12/31/2017  . Influenza-Unspecified 01/06/2014, 01/04/2015, 01/06/2017, 11/24/2018, 01/21/2020, 11/23/2021  . Moderna Covid-19 Fall Seasonal Vaccine 24yrs & older 01/23/2022, 08/28/2022  . Moderna Covid-19 Vaccine Bivalent Booster 86yrs & up 02/09/2021  . Moderna SARS-COV2 Booster Vaccination 09/01/2020  . Moderna  Sars-Covid-2 Vaccination 04/27/2019, 05/25/2019, 02/08/2020, 10/01/2020  . Pfizer(Comirnaty)Fall Seasonal Vaccine 12 years and older 01/23/2022, 12/18/2022  . Rsv, Bivalent, Protein Subunit Rsvpref,pf Verdis Frederickson) 02/12/2022  . Td (Adult),unspecified 10/06/2007  . Tdap 11/23/2009  . Zoster Recombinant(Shingrix) 08/22/2017, 10/27/2017   Pertinent  Health Maintenance Due  Topic Date Due  . INFLUENZA VACCINE  Completed      08/15/2022    2:00 PM 08/20/2022    2:26 PM 08/22/2022    2:10 PM 08/27/2022    2:18 PM 03/04/2023    1:09 PM  Fall Risk  Falls in the past year? 0 0 0 0 0  Was there an injury with Fall? 0 0 0 0 0  Fall Risk Category Calculator 0 0 0 0 0  Patient at Risk for Falls Due to No Fall Risks No Fall Risks No Fall Risks No Fall Risks   Fall risk Follow up Falls evaluation completed Falls evaluation completed Falls evaluation completed Falls evaluation completed    Functional Status Survey:    Vitals:   06/19/23 1654  BP: 108/66  Pulse: 77  Resp: 20  Temp: 97.7 F (36.5 C)  SpO2: 95%   There is no height or weight on file to calculate BMI. Physical Exam Vitals and nursing note reviewed.  Constitutional:      Appearance: Normal appearance.  HENT:     Head: Normocephalic and atraumatic.  Cardiovascular:     Rate and Rhythm: Normal rate and regular rhythm.     Heart sounds: No murmur heard. Pulmonary:     Effort: Pulmonary effort is normal. No respiratory distress.     Breath sounds: Normal breath sounds. No wheezing.  Abdominal:     General: Bowel sounds are normal. There is no distension.     Palpations: Abdomen is soft.     Tenderness: There is no abdominal tenderness.  Musculoskeletal:     Cervical back: No rigidity.     Right lower leg: Edema present.     Left lower leg: No edema.     Comments:  No calf tenderness or redness  Lymphadenopathy:     Cervical: No cervical adenopathy.  Skin:    General: Skin is warm and dry.     Comments: Right knee with  ace wrap. No drainage or warmth  Neurological:     Mental Status: He is alert and oriented to person, place, and time.  Psychiatric:        Mood and Affect: Mood normal.    Labs reviewed: Recent Labs    04/07/23 0822 06/09/23 1116 06/17/23 0336  NA 139 137 135  K 4.2 4.3 4.3  CL 106 105 102  CO2 28 26 26   GLUCOSE 94 90 121*  BUN 27* 26* 22  CREATININE 1.28* 1.13 1.02  CALCIUM 9.0 9.0 8.5*   Recent Labs    10/10/22 1031 04/07/23 0822  AST 23 18  ALT 17 18  ALKPHOS 52 47  BILITOT 1.0 1.3*  PROT 6.3* 6.1*  ALBUMIN 4.1 4.1   Recent Labs    10/10/22 1031 04/07/23 0822 06/09/23 1116 06/17/23 0336  WBC 9.4 4.7 6.9 11.6*  NEUTROABS 6.9 2.9  --   --   HGB 13.0 12.9* 12.6* 11.8*  HCT 39.4 38.8* 40.2 36.6*  MCV 94.7 91.3 96.4 95.8  PLT 179 145* 209 175   Lab Results  Component Value Date   TSH 0.673 03/23/2022   No results found for: "HGBA1C" Lab Results  Component Value Date   CHOL 160 10/29/2022   HDL 73 10/29/2022   LDLCALC 75 10/29/2022   TRIG 60 10/29/2022   CHOLHDL 2.2 10/29/2022    Significant Diagnostic Results in last 30 days:  No results found.  Assessment/Plan Osteoarthritis of the right knee, status post total knee arthroplasty Post-operative pain managed with medication, physical therapy, and ice machine. - Continue oxycodone with gradual weaning. - Use acetaminophen and methocarbamol as needed. - Continue ice machine use. - Wear compression hose and elevate leg. - Continue physical therapy. - Orthopedic follow-up in two weeks.  Constipation Constipation persists despite current regimen. - Miralax - Use suppository or enema as needed.  Atrial Fibrillation In normal sinus rhythm, ejection fraction 50-55%, on apixaban with good rate control. - Continue apixaban for anticoagulation.  Hypertension Well controlled during rehab.  Peripheral neuropathy Recently seen at pain clinic  Vocal cord paralysis Noted.    Follow-up Monitoring post-operative recovery and medical conditions. - Orthopedic follow-up in two weeks.

## 2023-06-20 DIAGNOSIS — M62561 Muscle wasting and atrophy, not elsewhere classified, right lower leg: Secondary | ICD-10-CM | POA: Diagnosis not present

## 2023-06-20 DIAGNOSIS — R531 Weakness: Secondary | ICD-10-CM | POA: Diagnosis not present

## 2023-06-20 DIAGNOSIS — M1711 Unilateral primary osteoarthritis, right knee: Secondary | ICD-10-CM | POA: Diagnosis not present

## 2023-06-20 DIAGNOSIS — R2689 Other abnormalities of gait and mobility: Secondary | ICD-10-CM | POA: Diagnosis not present

## 2023-06-20 DIAGNOSIS — M25561 Pain in right knee: Secondary | ICD-10-CM | POA: Diagnosis not present

## 2023-06-20 DIAGNOSIS — R278 Other lack of coordination: Secondary | ICD-10-CM | POA: Diagnosis not present

## 2023-06-22 DIAGNOSIS — R2689 Other abnormalities of gait and mobility: Secondary | ICD-10-CM | POA: Diagnosis not present

## 2023-06-22 DIAGNOSIS — R531 Weakness: Secondary | ICD-10-CM | POA: Diagnosis not present

## 2023-06-22 DIAGNOSIS — M1711 Unilateral primary osteoarthritis, right knee: Secondary | ICD-10-CM | POA: Diagnosis not present

## 2023-06-22 DIAGNOSIS — M62561 Muscle wasting and atrophy, not elsewhere classified, right lower leg: Secondary | ICD-10-CM | POA: Diagnosis not present

## 2023-06-22 DIAGNOSIS — R278 Other lack of coordination: Secondary | ICD-10-CM | POA: Diagnosis not present

## 2023-06-22 DIAGNOSIS — M25561 Pain in right knee: Secondary | ICD-10-CM | POA: Diagnosis not present

## 2023-06-23 ENCOUNTER — Encounter: Payer: Self-pay | Admitting: Internal Medicine

## 2023-06-23 ENCOUNTER — Non-Acute Institutional Stay (SKILLED_NURSING_FACILITY): Payer: Self-pay | Admitting: Internal Medicine

## 2023-06-23 DIAGNOSIS — I10 Essential (primary) hypertension: Secondary | ICD-10-CM

## 2023-06-23 DIAGNOSIS — R278 Other lack of coordination: Secondary | ICD-10-CM | POA: Diagnosis not present

## 2023-06-23 DIAGNOSIS — I480 Paroxysmal atrial fibrillation: Secondary | ICD-10-CM | POA: Diagnosis not present

## 2023-06-23 DIAGNOSIS — R2689 Other abnormalities of gait and mobility: Secondary | ICD-10-CM | POA: Diagnosis not present

## 2023-06-23 DIAGNOSIS — M62561 Muscle wasting and atrophy, not elsewhere classified, right lower leg: Secondary | ICD-10-CM | POA: Diagnosis not present

## 2023-06-23 DIAGNOSIS — M1711 Unilateral primary osteoarthritis, right knee: Secondary | ICD-10-CM | POA: Diagnosis not present

## 2023-06-23 DIAGNOSIS — K5909 Other constipation: Secondary | ICD-10-CM | POA: Diagnosis not present

## 2023-06-23 DIAGNOSIS — Z96651 Presence of right artificial knee joint: Secondary | ICD-10-CM

## 2023-06-23 DIAGNOSIS — R531 Weakness: Secondary | ICD-10-CM | POA: Diagnosis not present

## 2023-06-23 DIAGNOSIS — M25561 Pain in right knee: Secondary | ICD-10-CM | POA: Diagnosis not present

## 2023-06-23 NOTE — Progress Notes (Unsigned)
 Provider:   Location:  Medical illustrator of Service:  SNF (31)  PCP: [name], MD Patient Care Team: [name], MD as PCP - General (Internal Medicine) Sean Lemming, MD as PCP - Electrophysiology (Cardiology) Wanita Chamberlain, MD as Referring Physician (Hematology and Oncology)  Extended Emergency Contact Information Primary Emergency Contact: Stanley,Sean Address: [address]          [place], [place] [address] [place] of [place] Home Phone: [phone] Mobile Phone: [phone] Relation: Spouse Secondary Emergency Contact: Stanley,Sean Home Phone: [phone] Mobile Phone: [phone] Relation: Daughter  Code Status: Full Code Goals of Care: Advanced Directive information    06/16/2023    5:46 AM  Advanced Directives  Does Patient Have a Medical Advance Directive? Yes  Type of Estate agent of [place];Living will  Does patient want to make changes to medical advance directive? No - Patient declined  Copy of Healthcare Power of Attorney in Chart? Yes - validated most recent copy scanned in chart (See row information)      Chief Complaint  Patient presents with   New Admit To SNF    HPI: Patient is a 83 y.o. male seen today for admission to Rehab after undergoing Right Knee Arthroplasty   Patient lives in [place] with his wife  Has h/o PAF on Eliquis has undergone Cardioversion and staying in Sinus for now H/o Thyroid Cancer s/p Thyroidectomy H/o Mantle Cell Lymphoma in Remission H/o Peripheral Neuropathy BPH with Frequent urination H/o Insomnia  He underwent TKA on 06/16/2023 He is walking with his walker with mild assist Pain seems to be controlled but taking Tramadol 100 mg eery 6 hours Also having Dry mouth Continues to have issues with Constipation and insomnia  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 [facility])    being followed at [facility]   Aneurysm of splenic artery (HCC) 2014   s/p coiling 12/16/12 - [facility]   Atrial flutter (HCC)    BPH (benign prostatic hyperplasia)    Cancer (HCC)    melanoma on lower right back and left chest - surgically removed and cleared   Difficult intubation    Dysrhythmia    H/O agent Orange exposure    Headache    migraine- not current   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 Earl Gala told him he had improved   Paroxysmal atrial fibrillation (HCC) 01/02/2021   Pneumonia    Thoracic ascending aortic aneurysm (HCC)    4.5 cm 03/2018 CT. 4.4 cm on echo 10/2021 and on CTA 03/2021   Thyroid cancer [facility])    Past Surgical History:  Procedure Laterality Date   ABDOMINAL ANGIOGRAM  08/05/2012   aneurysm repair     ATRIAL FIBRILLATION ABLATION N/A  10/08/2022   Procedure: ATRIAL FIBRILLATION ABLATION;  Surgeon: Sean Lemming, MD;  Location: [facility] INVASIVE CV LAB;  Service: Cardiovascular;  Laterality: N/A;   CARDIOVERSION  02/12/2012   Procedure: CARDIOVERSION;  Surgeon: [name], MD;  Location: [facility] ENDOSCOPY;  Service: Cardiovascular;  Laterality: N/A;   CARDIOVERSION N/A 06/22/2013   Procedure: CARDIOVERSION;  Surgeon: Lars Masson, MD;  Location: [facility] ENDOSCOPY;  Service: Cardiovascular;  Laterality: N/A;   CARDIOVERSION N/A 06/19/2021   Procedure: CARDIOVERSION;  Surgeon:  Christell Constant, MD;  Location: [facility] ENDOSCOPY;  Service: Cardiovascular;  Laterality: N/A;   CATARACT EXTRACTION Bilateral 2018   with lens implant   CHOLECYSTECTOMY     COLONOSCOPY     ESOPHAGOGASTRODUODENOSCOPY     implantable loop recorder placement  03/29/2021   Medtronic Reveal Linq model LNQ [id number]) implantable loop recorder   IR IMAGING GUIDED PORT INSERTION  05/26/2018   IR REMOVAL TUN ACCESS W/ PORT W/O FL MOD SED  02/05/2021   LYMPH NODE BIOPSY Left 04/27/2018   Procedure: EXCISIONAL BIOPSY OF LEFT CERVICAL LYMPH NODE;  Surgeon: Christia Reading, MD;  Location: [facility] OR;  Service: ENT;  Laterality: Left;   LYMPH NODE BIOPSY Left 11/23/2018   Procedure: LEFT CERVICAL LYMPH NODE OPEN BIOPSY;  Surgeon: Christia Reading, MD;  Location: [facility] OR;  Service: ENT;  Laterality: Left;   MENISCUS REPAIR Right 2009   MITRAL VALVE REPAIR  2002   [facility]   NM MYOVIEW LTD  07/22/2006   no ischemia   RADICAL NECK DISSECTION Left 01/15/2019   Procedure: LEFT NECK DISSECTION;  Surgeon: Christia Reading, MD;  Location: [facility] OR;  Service: ENT;  Laterality: Left;   RIGHT HEART CATH  06/19/2004   normal right heart dynamics. EF 50%   TEE WITHOUT CARDIOVERSION  02/12/2012   Procedure: TRANSESOPHAGEAL ECHOCARDIOGRAM (TEE);  Surgeon: [name], MD;  Location: [facility] ENDOSCOPY;  Service: Cardiovascular;  Laterality: N/A;   TEE WITHOUT CARDIOVERSION N/A 06/22/2013   Procedure: TRANSESOPHAGEAL ECHOCARDIOGRAM (TEE);  Surgeon: Lars Masson, MD;  Location: [facility] ENDOSCOPY;  Service: Cardiovascular;  Laterality: N/A;   THYROIDECTOMY N/A 01/15/2019   Procedure: TOTAL THYROIDECTOMY;  Surgeon: Christia Reading, MD;  Location: [facility] OR;  Service: ENT;  Laterality: N/A;   TOTAL KNEE ARTHROPLASTY Right 06/16/2023   Procedure: ARTHROPLASTY, KNEE, TOTAL;  Surgeon: Ollen Gross, MD;  Location: [facility] ORS;  Service: Orthopedics;  Laterality: Right;    reports that he has never smoked. He has never used smokeless tobacco. He  reports that he does not currently use alcohol. He reports that he does not use drugs. 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  Tobacco Use   Smoking status: Never   Smokeless tobacco: Never   Tobacco comments:    Never smoke 04/10/22  Vaping Use   Vaping status: Never Used  Substance and Sexual Activity   Alcohol use: Not Currently   Drug use: No   Sexual activity: Not on file  Other Topics Concern   Not on file  Social History Narrative   Lives in [place], Retired   Social Drivers of Corporate investment banker Strain: Not on file  Food Insecurity: No Food Insecurity (06/16/2023)   Hunger Vital Sign  Worried About Programme researcher, broadcasting/film/video in the Last Year: Never true    Ran Out of Food in the Last Year: Never true  Transportation Needs: No Transportation Needs (06/16/2023)   PRAPARE - Administrator, Civil Service (Medical): No    Lack of Transportation (Non-Medical): No  Physical Activity: Not on file  Stress: Not on file  Social Connections: Patient Declined (06/16/2023)   Social Connection and Isolation Panel [NHANES]    Frequency of Communication with Friends and Family: Patient declined    Frequency of Social Gatherings with Friends and Family: Patient declined    Attends Religious Services: Patient declined    Database administrator or Organizations: Patient declined    Attends Banker Meetings: Patient declined    Marital Status: Patient declined  Intimate Partner Violence: Not At Risk (06/16/2023)   Humiliation, Afraid, Rape, and Kick questionnaire    Fear of Current or Ex-Partner: No    Emotionally Abused: No    Physically Abused: No    Sexually Abused: No    Functional Status Survey:    Family  History  Problem Relation Age of Onset   Parkinson's disease Mother 76   Heart failure Father 57   Cancer Maternal Grandmother    Heart attack Maternal Grandfather    Heart attack Paternal Grandmother    Heart attack Paternal Grandfather     Health Maintenance  Topic Date Due   Pneumonia Vaccine 55+ Years old (1 of 2 - PCV) Never done   DTaP/Tdap/Td (2 - Td or Tdap) 11/24/2019   COVID-19 Vaccine (8 - Moderna risk 2024-25 season) 06/17/2023   Medicare Annual Wellness (AWV)  12/11/2023   INFLUENZA VACCINE  Completed   Zoster Vaccines- Shingrix  Completed   HPV VACCINES  Aged Out    Allergies  Allergen Reactions   Rosuvastatin Other (See Comments)    Muscle/chest pain/Myalgias Currently taking M-W-F in the evening   Aspirin Other (See Comments)    Has been instructed not to take any blood thinners even aspirin due to history of several aneurysms     Levaquin [Levofloxacin In D5w]     tendonitis   Levofloxacin Other (See Comments)    Itching, tendonitis   Pravastatin Other (See Comments)    Muscle aches   Simvastatin Other (See Comments)    Chest pain  Other Reaction(s): myalgias   Allopurinol Rash   Ampicillin Rash    Did it involve swelling of the face/tongue/throat, SOB, or low BP? No  Did it involve sudden or severe rash/hives, skin peeling, or any reaction on the inside of your mouth or nose? Yes  Did you need to seek medical attention at a hospital or doctor's office? Yes  When did it last happen?      50+ years  If all above answers are "NO", may proceed with cephalosporin use.  Other Reaction(s): Not available   Nickel Itching and Rash   Penicillin G Rash    Tolerated Cephalosporin Date: 06/17/23.      Outpatient Encounter Medications as of 06/23/2023  Medication Sig   acetaminophen (TYLENOL) 500 MG tablet Take 1,000 mg by mouth daily as needed for moderate pain.   alfuzosin (UROXATRAL) 10 MG 24 hr tablet Take 10 mg by mouth every evening.   apixaban  (ELIQUIS) 5 MG TABS tablet Take 1 tablet (5 mg total) by mouth 2 (two) times daily.   buPROPion (WELLBUTRIN XL) 150 MG 24 hr tablet Take 1 tablet (  150 mg total) by mouth daily.   Cholecalciferol (VITAMIN D) 2000 units tablet Take 2,000 Units by mouth at bedtime.    ezetimibe (ZETIA) 10 MG tablet Take 1 tablet (10 mg total) by mouth every morning. (Patient taking differently: Take 10 mg by mouth every evening.)   finasteride (PROSCAR) 5 MG tablet Take 5 mg by mouth every evening.   levothyroxine (SYNTHROID) 150 MCG tablet Take 150 mcg by mouth daily before breakfast. 1 hour before breakfast   methocarbamol (ROBAXIN) 500 MG tablet Take 1 tablet (500 mg total) by mouth every 6 (six) hours as needed for muscle spasms.   metoprolol succinate (TOPROL-XL) 25 MG 24 hr tablet Take 12.5 mg by mouth in the morning.   ondansetron (ZOFRAN) 4 MG tablet Take 1 tablet (4 mg total) by mouth every 6 (six) hours as needed for nausea.   oxyCODONE (OXY IR/ROXICODONE) 5 MG immediate release tablet Take 1-2 tablets (5-10 mg total) by mouth every 6 (six) hours as needed for severe pain (pain score 7-10).   Polyethyl Glycol-Propyl Glycol (SYSTANE HYDRATION PF OP) Place 1 drop into both eyes 3 (three) times daily as needed (Dry eyes).   rosuvastatin (CRESTOR) 5 MG tablet Take 10 mg by mouth every Monday, Wednesday, and Friday. In the evening   sodium chloride (OCEAN) 0.65 % SOLN nasal spray Place 2 sprays into both nostrils at bedtime.   traMADol (ULTRAM) 50 MG tablet Take 1-2 tablets (50-100 mg total) by mouth every 6 (six) hours as needed for moderate pain (pain score 4-6).   No facility-administered encounter medications on file as of 06/23/2023.    Review of Systems  Constitutional:  Negative for activity change, appetite change and unexpected weight change.  HENT: Negative.    Respiratory:  Negative for cough and shortness of breath.   Cardiovascular:  Negative for leg swelling.  Gastrointestinal:  Positive for  constipation.  Genitourinary:  Negative for frequency.  Musculoskeletal:  Positive for arthralgias, gait problem and myalgias.  Skin: Negative.  Negative for rash.  Neurological:  Negative for dizziness and weakness.  Psychiatric/Behavioral:  Positive for sleep disturbance. Negative for confusion.   All other systems reviewed and are negative.   Vitals:   06/23/23 1153  BP: 122/65  Pulse: 67  Resp: 19  Temp: 97.8 F (36.6 C)   There is no height or weight on file to calculate BMI. Physical Exam Vitals reviewed.  Constitutional:      Appearance: Normal appearance.  HENT:     Head: Normocephalic.     Nose: Nose normal.     Mouth/Throat:     Mouth: Mucous membranes are moist.     Pharynx: Oropharynx is clear.  Eyes:     Pupils: Pupils are equal, round, and reactive to light.  Cardiovascular:     Rate and Rhythm: Normal rate and regular rhythm.     Pulses: Normal pulses.     Heart sounds: No murmur heard. Pulmonary:     Effort: Pulmonary effort is normal. No respiratory distress.     Breath sounds: Normal breath sounds. No rales.  Abdominal:     General: Abdomen is flat. Bowel sounds are normal.     Palpations: Abdomen is soft.  Musculoskeletal:     Cervical back: Neck supple.     Comments: Mild swelling in his Right Lower Leg  Skin:    General: Skin is warm.  Neurological:     General: No focal deficit present.     Mental Status:  He is alert and oriented to person, place, and time.  Psychiatric:        Mood and Affect: Mood normal.        Thought Content: Thought content normal.    Labs reviewed: Basic Metabolic Panel: Recent Labs    04/07/23 0822 06/09/23 1116 06/17/23 0336  NA 139 137 135  K 4.2 4.3 4.3  CL 106 105 102  CO2 28 26 26   GLUCOSE 94 90 121*  BUN 27* 26* 22  CREATININE 1.28* 1.13 1.02  CALCIUM 9.0 9.0 8.5*   Liver Function Tests: Recent Labs    10/10/22 1031 04/07/23 0822  AST 23 18  ALT 17 18  ALKPHOS 52 47  BILITOT 1.0 1.3*   PROT 6.3* 6.1*  ALBUMIN 4.1 4.1   No results for input(s): "LIPASE", "AMYLASE" in the last 8760 hours. No results for input(s): "AMMONIA" in the last 8760 hours. CBC: Recent Labs    10/10/22 1031 04/07/23 0822 06/09/23 1116 06/17/23 0336  WBC 9.4 4.7 6.9 11.6*  NEUTROABS 6.9 2.9  --   --   HGB 13.0 12.9* 12.6* 11.8*  HCT 39.4 38.8* 40.2 36.6*  MCV 94.7 91.3 96.4 95.8  PLT 179 145* 209 175   Cardiac Enzymes: No results for input(s): "CKTOTAL", "CKMB", "CKMBINDEX", "TROPONINI" in the last 8760 hours. BNP: Invalid input(s): "POCBNP" No results found for: "HGBA1C" Lab Results  Component Value Date   TSH 0.673 03/23/2022   Lab Results  Component Value Date   VITAMINB12 355 05/24/2019   No results found for: "FOLATE" No results found for: "IRON", "TIBC", "FERRITIN"  Imaging and Procedures obtained prior to SNF admission: No results found.  Assessment/Plan 1. Status post total right knee replacement (Primary) Pain Controlled Higher dose of Tramadol making him Dry Will Schedeule tramadol 50 mg TIFD for few daysContinue Tylenol PRN Also on Robaxing Therapy is working Overall doing well  2. Depression On Wellbutrin ***  3. Paroxysmal atrial fibrillation (HCC) Eliquis   4. Other constipation Try Miralax PRN    Family/ staff Communication:   Labs/tests ordered:

## 2023-06-24 DIAGNOSIS — Z471 Aftercare following joint replacement surgery: Secondary | ICD-10-CM | POA: Diagnosis not present

## 2023-06-24 DIAGNOSIS — M1711 Unilateral primary osteoarthritis, right knee: Secondary | ICD-10-CM | POA: Diagnosis not present

## 2023-06-24 DIAGNOSIS — M62561 Muscle wasting and atrophy, not elsewhere classified, right lower leg: Secondary | ICD-10-CM | POA: Diagnosis not present

## 2023-06-24 DIAGNOSIS — R278 Other lack of coordination: Secondary | ICD-10-CM | POA: Diagnosis not present

## 2023-06-24 DIAGNOSIS — R531 Weakness: Secondary | ICD-10-CM | POA: Diagnosis not present

## 2023-06-24 DIAGNOSIS — M25561 Pain in right knee: Secondary | ICD-10-CM | POA: Diagnosis not present

## 2023-06-24 DIAGNOSIS — R2689 Other abnormalities of gait and mobility: Secondary | ICD-10-CM | POA: Diagnosis not present

## 2023-06-25 ENCOUNTER — Encounter: Payer: Self-pay | Admitting: Orthopedic Surgery

## 2023-06-25 ENCOUNTER — Non-Acute Institutional Stay (SKILLED_NURSING_FACILITY): Payer: Self-pay | Admitting: Orthopedic Surgery

## 2023-06-25 DIAGNOSIS — M62561 Muscle wasting and atrophy, not elsewhere classified, right lower leg: Secondary | ICD-10-CM | POA: Diagnosis not present

## 2023-06-25 DIAGNOSIS — R350 Frequency of micturition: Secondary | ICD-10-CM

## 2023-06-25 DIAGNOSIS — M1711 Unilateral primary osteoarthritis, right knee: Secondary | ICD-10-CM | POA: Diagnosis not present

## 2023-06-25 DIAGNOSIS — Z96651 Presence of right artificial knee joint: Secondary | ICD-10-CM | POA: Diagnosis not present

## 2023-06-25 DIAGNOSIS — R2689 Other abnormalities of gait and mobility: Secondary | ICD-10-CM | POA: Diagnosis not present

## 2023-06-25 DIAGNOSIS — R278 Other lack of coordination: Secondary | ICD-10-CM | POA: Diagnosis not present

## 2023-06-25 DIAGNOSIS — N400 Enlarged prostate without lower urinary tract symptoms: Secondary | ICD-10-CM | POA: Diagnosis not present

## 2023-06-25 DIAGNOSIS — R531 Weakness: Secondary | ICD-10-CM | POA: Diagnosis not present

## 2023-06-25 DIAGNOSIS — M25561 Pain in right knee: Secondary | ICD-10-CM | POA: Diagnosis not present

## 2023-06-25 MED ORDER — TAMSULOSIN HCL 0.4 MG PO CAPS: 0.4000 mg | ORAL_CAPSULE | Freq: Once | ORAL | Status: AC

## 2023-06-25 NOTE — Progress Notes (Signed)
 Location:  [facility] Room Number: 159/A Place of Service:  SNF [phone]) Provider:  Octavia Heir, NP   [name], MD  Patient Care Team: [name], MD as PCP - General (Internal Medicine) Regan Lemming, MD as PCP - Electrophysiology (Cardiology) Wanita Chamberlain, MD as Referring Physician (Hematology and Oncology)  Extended Emergency Contact Information Primary Emergency Contact: Stebner,Nancy Address: [address]          [place], [place] [address] [place] Home Phone: [phone] Mobile Phone: [phone] Relation: Spouse Secondary Emergency Contact: Grant,Jennifer Home Phone: [phone] Mobile Phone: [phone] Relation: Daughter  Code Status:  Full code Goals of care: Advanced Directive information    06/16/2023    5:46 AM  Advanced Directives  Does Patient Have a Medical Advance Directive? Yes  Type of Estate agent of [place];Living will  Does patient want to make changes to medical advance directive? No - Patient declined  Copy of Healthcare Power of Attorney in Chart? Yes - validated most recent copy scanned in chart (See row information)     Chief Complaint  Patient presents with   Acute Visit    Urinary frequency    HPI:  Pt is a 83 y.o. male seen today for acute visit due to urinary frequency.   He currently resides on the rehabilitation unit at [facility] due to recent right knee arthroplasty 06/16/2023. He has noticed increased urinary frequency since surgery. H/o BPH. He is taking finasteride as prescribed. Denies malaise, fever, dysuria or flank pain. He was catherized a brief time during surgery. LBM 04/02, moderate size. Vitals stable.   RTKA- POD #9, pain tolerated with Tramadol, no recent falls, WBAT, remains on Eliquis, he would like to go home tomorrow if urinary symptoms have improved.     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 - [facility]   Atrial flutter (HCC)    BPH (benign prostatic hyperplasia)    Cancer (HCC)    melanoma on lower right back and left chest - surgically removed and cleared   Difficult intubation    Dysrhythmia    H/O agent Orange exposure    Headache    migraine- not current   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 [place] Gala told him he had improved   Paroxysmal atrial fibrillation (HCC) 01/02/2021   Pneumonia    Thoracic ascending aortic aneurysm (HCC)    4.5 cm 03/2018 CT. 4.4 cm on echo 10/2021 and on CTA 03/2021   Thyroid cancer [facility])    Past Surgical History:  Procedure Laterality Date   ABDOMINAL ANGIOGRAM  08/05/2012  aneurysm repair     ATRIAL FIBRILLATION ABLATION N/A 10/08/2022   Procedure: ATRIAL FIBRILLATION ABLATION;  Surgeon: Regan Lemming, MD;  Location: [facility] INVASIVE CV LAB;  Service: Cardiovascular;  Laterality: N/A;   CARDIOVERSION  02/12/2012   Procedure: CARDIOVERSION;  Surgeon: [name], MD;  Location: [facility] ENDOSCOPY;  Service: Cardiovascular;  Laterality: N/A;   CARDIOVERSION N/A 06/22/2013   Procedure: CARDIOVERSION;  Surgeon: Lars Masson, MD;  Location: [facility] ENDOSCOPY;  Service: Cardiovascular;  Laterality: N/A;   CARDIOVERSION  N/A 06/19/2021   Procedure: CARDIOVERSION;  Surgeon: Christell Constant, MD;  Location: [facility] ENDOSCOPY;  Service: Cardiovascular;  Laterality: N/A;   CATARACT EXTRACTION Bilateral 2018   with lens implant   CHOLECYSTECTOMY     COLONOSCOPY     ESOPHAGOGASTRODUODENOSCOPY     implantable loop recorder placement  03/29/2021   Medtronic Reveal Linq model LNQ 22 [id number] G) implantable loop recorder   IR IMAGING GUIDED PORT INSERTION  05/26/2018   IR REMOVAL TUN ACCESS W/ PORT W/O FL MOD SED  02/05/2021   LYMPH NODE BIOPSY Left 04/27/2018   Procedure: EXCISIONAL BIOPSY OF LEFT CERVICAL LYMPH NODE;  Surgeon: Christia Reading, MD;  Location: [facility] OR;  Service: ENT;  Laterality: Left;   LYMPH NODE BIOPSY Left 11/23/2018   Procedure: LEFT CERVICAL LYMPH NODE OPEN BIOPSY;  Surgeon: Christia Reading, MD;  Location[facility] OR;  Service: ENT;  Laterality: Left;   MENISCUS REPAIR Right 2009   MITRAL VALVE REPAIR  2002   [facility]   NM MYOVIEW LTD  07/22/2006   no ischemia   RADICAL NECK DISSECTION Left 01/15/2019   Procedure: LEFT NECK DISSECTION;  Surgeon: Christia Reading, MD;  Location: [facility] OR;  Service: ENT;  Laterality: Left;   RIGHT HEART CATH  06/19/2004   normal right heart dynamics. EF 50%   TEE WITHOUT CARDIOVERSION  02/12/2012   Procedure: TRANSESOPHAGEAL ECHOCARDIOGRAM (TEE);  Surgeon: [name], MD;  Location: [facility] ENDOSCOPY;  Service: Cardiovascular;  Laterality: N/A;   TEE WITHOUT CARDIOVERSION N/A 06/22/2013   Procedure: TRANSESOPHAGEAL ECHOCARDIOGRAM (TEE);  Surgeon: Lars Masson, MD;  Location: [facility] ENDOSCOPY;  Service: Cardiovascular;  Laterality: N/A;   THYROIDECTOMY N/A 01/15/2019   Procedure: TOTAL THYROIDECTOMY;  Surgeon: Christia Reading, MD;  Location: [facility] OR;  Service: ENT;  Laterality: N/A;   TOTAL KNEE ARTHROPLASTY Right 06/16/2023   Procedure: ARTHROPLASTY, KNEE, TOTAL;  Surgeon: Ollen Gross, MD;  Location: [facility] ORS;  Service: Orthopedics;  Laterality: Right;    Allergies  Allergen  Reactions   Rosuvastatin Other (See Comments)    Muscle/chest pain/Myalgias Currently taking M-W-F in the evening   Aspirin Other (See Comments)    Has been instructed not to take any blood thinners even aspirin due to history of several aneurysms     Levaquin [Levofloxacin In D5w]     tendonitis   Levofloxacin Other (See Comments)    Itching, tendonitis   Pravastatin Other (See Comments)    Muscle aches   Simvastatin Other (See Comments)    Chest pain  Other Reaction(s): myalgias   Allopurinol Rash   Ampicillin Rash    Did it involve swelling of the face/tongue/throat, SOB, or low BP? No  Did it involve sudden or severe rash/hives, skin peeling, or any reaction on the inside of your mouth or nose? Yes  Did you need to seek medical attention at a hospital or doctor's office? Yes  When did it last happen?      50+ years  If  all above answers are "NO", may proceed with cephalosporin use.  Other Reaction(s): Not available   Nickel Itching and Rash   Penicillin G Rash    Tolerated Cephalosporin Date: 06/17/23.      Outpatient Encounter Medications as of 06/25/2023  Medication Sig   acetaminophen (TYLENOL) 500 MG tablet Take 1,000 mg by mouth daily as needed for moderate pain.   alfuzosin (UROXATRAL) 10 MG 24 hr tablet Take 10 mg by mouth every evening.   apixaban (ELIQUIS) 5 MG TABS tablet Take 1 tablet (5 mg total) by mouth 2 (two) times daily.   buPROPion (WELLBUTRIN XL) 150 MG 24 hr tablet Take 1 tablet (150 mg total) by mouth daily.   Cholecalciferol (VITAMIN D) 2000 units tablet Take 2,000 Units by mouth at bedtime.    ezetimibe (ZETIA) 10 MG tablet Take 1 tablet (10 mg total) by mouth every morning. (Patient taking differently: Take 10 mg by mouth every evening.)   finasteride (PROSCAR) 5 MG tablet Take 5 mg by mouth every evening.   levothyroxine (SYNTHROID) 150 MCG tablet Take 150 mcg by mouth daily before breakfast. 1 hour before breakfast   methocarbamol (ROBAXIN)  500 MG tablet Take 1 tablet (500 mg total) by mouth every 6 (six) hours as needed for muscle spasms.   metoprolol succinate (TOPROL-XL) 25 MG 24 hr tablet Take 12.5 mg by mouth in the morning.   ondansetron (ZOFRAN) 4 MG tablet Take 1 tablet (4 mg total) by mouth every 6 (six) hours as needed for nausea.   oxyCODONE (OXY IR/ROXICODONE) 5 MG immediate release tablet Take 1-2 tablets (5-10 mg total) by mouth every 6 (six) hours as needed for severe pain (pain score 7-10).   Polyethyl Glycol-Propyl Glycol (SYSTANE HYDRATION PF OP) Place 1 drop into both eyes 3 (three) times daily as needed (Dry eyes).   rosuvastatin (CRESTOR) 5 MG tablet Take 10 mg by mouth every Monday, Wednesday, and Friday. In the evening   sodium chloride (OCEAN) 0.65 % SOLN nasal spray Place 2 sprays into both nostrils at bedtime.   traMADol (ULTRAM) 50 MG tablet Take 1-2 tablets (50-100 mg total) by mouth every 6 (six) hours as needed for moderate pain (pain score 4-6).   No facility-administered encounter medications on file as of 06/25/2023.    Review of Systems  Constitutional:  Negative for fatigue and fever.  HENT: Negative.    Respiratory:  Negative for shortness of breath.   Cardiovascular:  Negative for chest pain.  Gastrointestinal:  Negative for abdominal distention and constipation.  Genitourinary:  Positive for frequency. Negative for dysuria, flank pain and hematuria.  Musculoskeletal:  Positive for arthralgias and gait problem.  Skin:  Positive for wound.  Neurological:  Negative for dizziness and weakness.  Psychiatric/Behavioral:  Negative for confusion and dysphoric mood. The patient is not nervous/anxious.     Immunization History  Administered Date(s) Administered   Fluad Trivalent(High Dose 65+) 12/18/2022   Influenza, High Dose Seasonal PF 12/29/2013, 12/31/2017   Influenza-Unspecified 01/06/2014, 01/04/2015, 01/06/2017, 11/24/2018, 01/21/2020, 11/23/2021   Moderna Covid-19 Fall Seasonal Vaccine  51yrs & older 01/23/2022, 08/28/2022   Moderna Covid-19 Vaccine Bivalent Booster 77yrs & up 02/09/2021   Moderna SARS-COV2 Booster Vaccination 09/01/2020   Moderna Sars-Covid-2 Vaccination 04/27/2019, 05/25/2019, 02/08/2020, 10/01/2020   Pfizer(Comirnaty)Fall Seasonal Vaccine 12 years and older 01/23/2022, 12/18/2022   Rsv, Bivalent, Protein Subunit Rsvpref,pf Verdis Frederickson) 02/12/2022   Td (Adult),unspecified 10/06/2007   Tdap 11/23/2009   Zoster Recombinant(Shingrix) 08/22/2017, 10/27/2017   Pertinent  Health  Maintenance Due  Topic Date Due   INFLUENZA VACCINE  10/24/2023      08/15/2022    2:00 PM 08/20/2022    2:26 PM 08/22/2022    2:10 PM 08/27/2022    2:18 PM 03/04/2023    1:09 PM  Fall Risk  Falls in the past year? 0 0 0 0 0  Was there an injury with Fall? 0 0 0 0 0  Fall Risk Category Calculator 0 0 0 0 0  Patient at Risk for Falls Due to No Fall Risks No Fall Risks No Fall Risks No Fall Risks   Fall risk Follow up Falls evaluation completed Falls evaluation completed Falls evaluation completed Falls evaluation completed    Functional Status Survey:    Vitals:   06/25/23 1620  BP: 124/61  Pulse: 74  Resp: 14  Temp: 98.1 F (36.7 C)  SpO2: 97%  Weight: 217 lb 6.4 oz (98.6 kg)  Height: 6\' 1"  (1.854 m)   Body mass index is 28.68 kg/m. Physical Exam Vitals reviewed.  Constitutional:      General: He is not in acute distress. HENT:     Head: Normocephalic.  Eyes:     General:        Right eye: No discharge.        Left eye: No discharge.  Cardiovascular:     Rate and Rhythm: Normal rate. Rhythm irregular.     Pulses: Normal pulses.     Heart sounds: Normal heart sounds.  Pulmonary:     Effort: Pulmonary effort is normal.     Breath sounds: Normal breath sounds.  Abdominal:     General: Bowel sounds are normal.     Palpations: Abdomen is soft.  Musculoskeletal:     Cervical back: Neck supple.     Right lower leg: No edema.     Left lower leg: No edema.   Skin:    General: Skin is warm.     Capillary Refill: Capillary refill takes less than 2 seconds.     Comments: Right knee surgical dressing, CDI  Neurological:     General: No focal deficit present.     Mental Status: He is alert and oriented to person, place, and time.     Gait: Gait abnormal.  Psychiatric:        Mood and Affect: Mood normal.     Labs reviewed: Recent Labs    04/07/23 0822 06/09/23 1116 06/17/23 0336  NA 139 137 135  K 4.2 4.3 4.3  CL 106 105 102  CO2 28 26 26   GLUCOSE 94 90 121*  BUN 27* 26* 22  CREATININE 1.28* 1.13 1.02  CALCIUM 9.0 9.0 8.5*   Recent Labs    10/10/22 1031 04/07/23 0822  AST 23 18  ALT 17 18  ALKPHOS 52 47  BILITOT 1.0 1.3*  PROT 6.3* 6.1*  ALBUMIN 4.1 4.1   Recent Labs    10/10/22 1031 04/07/23 0822 06/09/23 1116 06/17/23 0336  WBC 9.4 4.7 6.9 11.6*  NEUTROABS 6.9 2.9  --   --   HGB 13.0 12.9* 12.6* 11.8*  HCT 39.4 38.8* 40.2 36.6*  MCV 94.7 91.3 96.4 95.8  PLT 179 145* 209 175   Lab Results  Component Value Date   TSH 0.673 03/23/2022   No results found for: "HGBA1C" Lab Results  Component Value Date   CHOL 160 10/29/2022   HDL 73 10/29/2022   LDLCALC 75 10/29/2022   TRIG 60 10/29/2022  CHOLHDL 2.2 10/29/2022    Significant Diagnostic Results in last 30 days:  CUP PACEART REMOTE DEVICE CHECK Result Date: 06/10/2023 ILR summary report received. Battery status OK. Normal device function. No new symptom, tachy, brady, or pause episodes. No new AF episodes. Monthly summary reports and ROV/PRN LA, CVRS   Assessment/Plan 1. Urinary frequency (Primary) - began after surgery - h/o BPH on finasteride - catherized brief time during surgery> ? Prostate irritation versus UTI - will try Flomax 0.4 mg po once> if no improvement collect UA  2. Status post total right knee replacement - 03/24 RTKA with Dr. Lequita Halt f/u 04/09 - WBAT - pain controlled with Tramadol - no recent falls - moving bowels - on  Eliquis - interested in discharge home if urinary symptoms improve    Family/ staff Communication: plan discussed with patient and nurse  Labs/tests ordered: UA if no improvement with Flomax

## 2023-06-26 DIAGNOSIS — M25561 Pain in right knee: Secondary | ICD-10-CM | POA: Diagnosis not present

## 2023-06-26 DIAGNOSIS — M62561 Muscle wasting and atrophy, not elsewhere classified, right lower leg: Secondary | ICD-10-CM | POA: Diagnosis not present

## 2023-06-26 DIAGNOSIS — M1711 Unilateral primary osteoarthritis, right knee: Secondary | ICD-10-CM | POA: Diagnosis not present

## 2023-06-26 DIAGNOSIS — R531 Weakness: Secondary | ICD-10-CM | POA: Diagnosis not present

## 2023-06-26 DIAGNOSIS — R278 Other lack of coordination: Secondary | ICD-10-CM | POA: Diagnosis not present

## 2023-06-26 DIAGNOSIS — R2689 Other abnormalities of gait and mobility: Secondary | ICD-10-CM | POA: Diagnosis not present

## 2023-06-27 ENCOUNTER — Other Ambulatory Visit: Payer: Self-pay | Admitting: Adult Health

## 2023-06-27 DIAGNOSIS — R278 Other lack of coordination: Secondary | ICD-10-CM | POA: Diagnosis not present

## 2023-06-27 DIAGNOSIS — M25561 Pain in right knee: Secondary | ICD-10-CM | POA: Diagnosis not present

## 2023-06-27 DIAGNOSIS — R531 Weakness: Secondary | ICD-10-CM | POA: Diagnosis not present

## 2023-06-27 DIAGNOSIS — M1711 Unilateral primary osteoarthritis, right knee: Secondary | ICD-10-CM | POA: Diagnosis not present

## 2023-06-27 DIAGNOSIS — M62561 Muscle wasting and atrophy, not elsewhere classified, right lower leg: Secondary | ICD-10-CM | POA: Diagnosis not present

## 2023-06-27 DIAGNOSIS — R2689 Other abnormalities of gait and mobility: Secondary | ICD-10-CM | POA: Diagnosis not present

## 2023-06-27 MED ORDER — TRAMADOL HCL 50 MG PO TABS
50.0000 mg | ORAL_TABLET | Freq: Four times a day (QID) | ORAL | 0 refills | Status: DC | PRN
Start: 2023-06-27 — End: 2023-07-09

## 2023-07-01 DIAGNOSIS — R2681 Unsteadiness on feet: Secondary | ICD-10-CM | POA: Diagnosis not present

## 2023-07-01 DIAGNOSIS — R278 Other lack of coordination: Secondary | ICD-10-CM | POA: Diagnosis not present

## 2023-07-01 DIAGNOSIS — Z7409 Other reduced mobility: Secondary | ICD-10-CM | POA: Diagnosis not present

## 2023-07-01 DIAGNOSIS — M1711 Unilateral primary osteoarthritis, right knee: Secondary | ICD-10-CM | POA: Diagnosis not present

## 2023-07-01 DIAGNOSIS — Z96651 Presence of right artificial knee joint: Secondary | ICD-10-CM | POA: Diagnosis not present

## 2023-07-03 DIAGNOSIS — Z96651 Presence of right artificial knee joint: Secondary | ICD-10-CM | POA: Diagnosis not present

## 2023-07-03 DIAGNOSIS — Z7409 Other reduced mobility: Secondary | ICD-10-CM | POA: Diagnosis not present

## 2023-07-03 DIAGNOSIS — M1711 Unilateral primary osteoarthritis, right knee: Secondary | ICD-10-CM | POA: Diagnosis not present

## 2023-07-03 DIAGNOSIS — R278 Other lack of coordination: Secondary | ICD-10-CM | POA: Diagnosis not present

## 2023-07-03 DIAGNOSIS — R2681 Unsteadiness on feet: Secondary | ICD-10-CM | POA: Diagnosis not present

## 2023-07-07 DIAGNOSIS — Z96651 Presence of right artificial knee joint: Secondary | ICD-10-CM | POA: Diagnosis not present

## 2023-07-07 DIAGNOSIS — Z7409 Other reduced mobility: Secondary | ICD-10-CM | POA: Diagnosis not present

## 2023-07-07 DIAGNOSIS — M1711 Unilateral primary osteoarthritis, right knee: Secondary | ICD-10-CM | POA: Diagnosis not present

## 2023-07-07 DIAGNOSIS — R2681 Unsteadiness on feet: Secondary | ICD-10-CM | POA: Diagnosis not present

## 2023-07-07 DIAGNOSIS — R278 Other lack of coordination: Secondary | ICD-10-CM | POA: Diagnosis not present

## 2023-07-09 ENCOUNTER — Encounter: Payer: Self-pay | Admitting: Neurology

## 2023-07-09 ENCOUNTER — Ambulatory Visit (INDEPENDENT_AMBULATORY_CARE_PROVIDER_SITE_OTHER): Payer: Medicare Other | Admitting: Neurology

## 2023-07-09 VITALS — BP 107/58 | HR 76 | Ht 73.0 in | Wt 218.0 lb

## 2023-07-09 DIAGNOSIS — G6289 Other specified polyneuropathies: Secondary | ICD-10-CM | POA: Diagnosis not present

## 2023-07-09 DIAGNOSIS — Z8679 Personal history of other diseases of the circulatory system: Secondary | ICD-10-CM

## 2023-07-09 DIAGNOSIS — Z77098 Contact with and (suspected) exposure to other hazardous, chiefly nonmedicinal, chemicals: Secondary | ICD-10-CM | POA: Diagnosis not present

## 2023-07-09 DIAGNOSIS — G3184 Mild cognitive impairment, so stated: Secondary | ICD-10-CM

## 2023-07-09 DIAGNOSIS — G620 Drug-induced polyneuropathy: Secondary | ICD-10-CM | POA: Diagnosis not present

## 2023-07-09 MED ORDER — ACETAMINOPHEN-CODEINE 300-30 MG PO TABS: 1.0000 | ORAL_TABLET | Freq: Four times a day (QID) | ORAL | 0 refills | Status: DC | PRN

## 2023-07-09 NOTE — Progress Notes (Signed)
 Provider:  Neomia Banner, MD  Primary Care Physician:  Job Mulch, MD [address] [place] [address]     Referring Provider: Job Mulch, Md [address] [place],  [place] [address]          Chief Complaint according to patient   Patient presents with:          Rm 2 with spouse Pt is well, reports he is still not sleeping well but recently had knee replacement. He reprorts memory is stable. Interested in tips for pain control and memory testing.        HISTORY OF PRESENT ILLNESS:  Sean Stanley is a 83 y.o. male patient who is here for revisit 07/09/2023 for  memory concerns, he reports he has trouble with the trivia quiz at [facility]. He had no trouble writing his memories but he often  can't remember what he ate for breakfast, he struggles for words.   Word finding - charades.  Chief concern according to patient :  I am sleeping less than ever. Pain is contributing. He goes to void every time he wakes up.  Sleeping poorly a may affect his memory as well. He takes tylenol and robaxin and tramadol.  He currently walks with a walker, March 21 st was his surgery, TKR on the right, he is understandably in pain.    He is concerned as his 35 year younger brother is now suffering from Alzheimer disease and already needed  moving to a care home.  Mother had PD and depression treated with ECT.           07/09/2023    1:31 PM 05/22/2022   10:31 AM 05/22/2021   10:52 AM 11/15/2020   10:27 AM 04/30/2018   10:30 AM  Montreal Cognitive Assessment   Visuospatial/ Executive (0/5) 5 4 5 4 5   Naming (0/3) 3 3 3 3 3   Attention: Read list of digits (0/2) 2 2 2 2 2   Attention: Read list of letters (0/1) 1 1 1 1 1   Attention: Serial 7 subtraction starting at 100 (0/3) 3 3 3 3 3   Language: Repeat phrase (0/2) 2 2 2 2 2   Language : Fluency (0/1) 0 0 1 0 1  Abstraction (0/2) 2 2 2 2 2   Delayed Recall (0/5) 2 4 3 3  0  Orientation (0/6) 6 6 6 6 6    Total 26 27 28 26 25        Review of Systems: Out of a complete 14 system review, the patient complains of only the following symptoms, and all other reviewed systems are negative.:   Insomnia related to pain,  delayed word finding. Hx of AD in the family.    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  Tobacco Use   Smoking status: Never   Smokeless tobacco: Never   Tobacco comments:    Never smoke 04/10/22  Vaping Use   Vaping status: Never Used  Substance and Sexual Activity   Alcohol use: Not Currently   Drug use: No   Sexual activity: Not on file  Other Topics Concern   Not on file  Social History Narrative  Lives in [place], Retired   Social Drivers of Corporate investment banker Strain: Not on file  Food Insecurity: No Food Insecurity (06/16/2023)   Hunger Vital Sign    Worried About Running Out of Food in the Last Year: Never true    Ran Out of Food in the Last Year: Never true  Transportation Needs: No Transportation Needs (06/16/2023)   PRAPARE - Administrator, Civil Service (Medical): No    Lack of Transportation (Non-Medical): No  Physical Activity: Not on file  Stress: Not on file  Social Connections: Patient Declined (06/16/2023)   Social Connection and Isolation Panel [NHANES]    Frequency of Communication with Friends and Family: Patient declined    Frequency of Social Gatherings with Friends and Family: Patient declined    Attends Religious Audiological scientist of Clubs or Organizations:[employer]     Attends Banker Meetings: committee member at [facility]     Marital Status: married     Family History  Problem Relation Age of Onset   Parkinson's disease Mother 75    Heart failure Father 31   Cancer Maternal Grandmother    Heart attack Maternal Grandfather    Heart attack Paternal Grandmother    Heart attack Paternal Grandfather     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 [facility])    being followed at [facility]   Aneurysm of splenic artery (HCC) 2014   s/p coiling 12/16/12 - [facility]   Atrial flutter (HCC)    BPH (benign prostatic hyperplasia)    Cancer (HCC)    melanoma on lower right back and left chest - surgically removed and cleared   Difficult intubation    Dysrhythmia    H/O agent Orange exposure    Headache    migraine- not current   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 Earl Gala told him he had improved   Paroxysmal atrial fibrillation (HCC) 01/02/2021   Pneumonia    Thoracic ascending aortic aneurysm (HCC)    4.5 cm 03/2018 CT. 4.4 cm on echo 10/2021 and on CTA 03/2021   Thyroid cancer [facility])     Past Surgical History:  Procedure Laterality Date   ABDOMINAL ANGIOGRAM  08/05/2012   aneurysm repair     ATRIAL FIBRILLATION ABLATION N/A 10/08/2022   Procedure: ATRIAL FIBRILLATION ABLATION;  Surgeon: Regan Lemming, MD;  Location: [facility] INVASIVE CV LAB;  Service: Cardiovascular;  Laterality: N/A;   CARDIOVERSION  02/12/2012   Procedure: CARDIOVERSION;  Surgeon: [name], MD;  Location: [facility] ENDOSCOPY;   Service: Cardiovascular;  Laterality: N/A;   CARDIOVERSION N/A 06/22/2013   Procedure: CARDIOVERSION;  Surgeon: Lars Masson, MD;  Location: [facility] ENDOSCOPY;  Service: Cardiovascular;  Laterality: N/A;   CARDIOVERSION N/A 06/19/2021   Procedure: CARDIOVERSION;  Surgeon: Christell Constant, MD;  Location: [facility] ENDOSCOPY;  Service: Cardiovascular;  Laterality: N/A;   CATARACT EXTRACTION Bilateral 2018   with lens implant   CHOLECYSTECTOMY     COLONOSCOPY     ESOPHAGOGASTRODUODENOSCOPY     implantable loop recorder placement  03/29/2021   Medtronic Reveal Linq model LNQ [id number]) implantable loop recorder   IR IMAGING GUIDED PORT INSERTION  05/26/2018   IR REMOVAL TUN ACCESS W/ PORT W/O FL MOD SED  02/05/2021   LYMPH NODE BIOPSY Left 04/27/2018   Procedure: EXCISIONAL BIOPSY OF LEFT CERVICAL LYMPH NODE;  Surgeon: Christia Reading, MD;  Location: [facility] OR;  Service: ENT;  Laterality: Left;   LYMPH NODE BIOPSY Left 11/23/2018   Procedure: LEFT CERVICAL LYMPH NODE OPEN BIOPSY;  Surgeon: Christia Reading, MD;  Location: [facility] OR;  Service: ENT;  Laterality: Left;   MENISCUS REPAIR Right 2009   MITRAL VALVE REPAIR  2002   [facility]   NM MYOVIEW LTD  07/22/2006   no ischemia   RADICAL NECK DISSECTION Left 01/15/2019   Procedure: LEFT NECK DISSECTION;  Surgeon: Christia Reading, MD;  Location: [facility] OR;  Service: ENT;  Laterality: Left;   RIGHT HEART CATH  06/19/2004   normal right heart dynamics. EF 50%   TEE WITHOUT CARDIOVERSION  02/12/2012   Procedure: TRANSESOPHAGEAL ECHOCARDIOGRAM (TEE);  Surgeon: [name], MD;  Location: [facility] ENDOSCOPY;  Service: Cardiovascular;  Laterality: N/A;   TEE WITHOUT CARDIOVERSION N/A 06/22/2013   Procedure: TRANSESOPHAGEAL ECHOCARDIOGRAM (TEE);  Surgeon: Lars Masson, MD;  Location: [facility] ENDOSCOPY;  Service: Cardiovascular;  Laterality: N/A;   THYROIDECTOMY N/A 01/15/2019   Procedure: TOTAL THYROIDECTOMY;  Surgeon: Christia Reading, MD;  Location: [facility] OR;  Service:  ENT;  Laterality: N/A;   TOTAL KNEE ARTHROPLASTY Right 06/16/2023   Procedure: ARTHROPLASTY, KNEE, TOTAL;  Surgeon: Ollen Gross, MD;  Location: [facility] ORS;  Service: Orthopedics;  Laterality: Right;     Current Outpatient Medications on File Prior to Visit  Medication Sig Dispense Refill   acetaminophen (TYLENOL) 500 MG tablet Take 1,000 mg by mouth daily as needed for moderate pain.     alfuzosin (UROXATRAL) 10 MG 24 hr tablet Take 10 mg by mouth every evening.     apixaban (ELIQUIS) 5 MG TABS tablet Take 1 tablet (5 mg total) by mouth 2 (two) times daily. 60 tablet 3   buPROPion (WELLBUTRIN XL) 150 MG 24 hr tablet Take 1 tablet (150 mg total) by mouth daily. 90 tablet 3   Cholecalciferol (VITAMIN D) 2000 units tablet Take 2,000 Units by mouth at bedtime.      ezetimibe (ZETIA) 10 MG tablet Take 1 tablet (10 mg total) by mouth every morning. (Patient taking differently: Take 10 mg by mouth every evening.) 90 tablet 2   finasteride (PROSCAR) 5 MG tablet Take 5 mg by mouth every evening.     levothyroxine (SYNTHROID) 150 MCG tablet Take 150 mcg by mouth daily before breakfast. 1 hour before breakfast     methocarbamol (ROBAXIN) 500 MG tablet Take 1 tablet (500 mg total) by mouth every 6 (six) hours as needed for muscle spasms. 40 tablet 0   metoprolol succinate (TOPROL-XL) 25 MG 24 hr tablet Take 12.5 mg by mouth in the morning.     ondansetron (ZOFRAN) 4 MG tablet Take 1 tablet (4 mg total) by mouth every 6 (six) hours as needed for nausea. 20 tablet 0  Polyethyl Glycol-Propyl Glycol (SYSTANE HYDRATION PF OP) Place 1 drop into both eyes 3 (three) times daily as needed (Dry eyes).     rosuvastatin (CRESTOR) 5 MG tablet Take 10 mg by mouth every Monday, Wednesday, and Friday. In the evening     sodium chloride (OCEAN) 0.65 % SOLN nasal spray Place 2 sprays into both nostrils at bedtime.     traMADol (ULTRAM) 50 MG tablet Take 1-2 tablets (50-100 mg total) by mouth every 6 (six) hours as needed for  moderate pain (pain score 4-6). 40 tablet 0   No current facility-administered medications on file prior to visit.    Allergies  Allergen Reactions   Rosuvastatin Other (See Comments)    Muscle/chest pain/Myalgias Currently taking M-W-F in the evening   Aspirin Other (See Comments)    Has been instructed not to take any blood thinners even aspirin due to history of several aneurysms     Levaquin [Levofloxacin In D5w]     tendonitis   Levofloxacin Other (See Comments)    Itching, tendonitis   Pravastatin Other (See Comments)    Muscle aches   Simvastatin Other (See Comments)    Chest pain  Other Reaction(s): myalgias   Allopurinol Rash   Ampicillin Rash    Did it involve swelling of the face/tongue/throat, SOB, or low BP? No  Did it involve sudden or severe rash/hives, skin peeling, or any reaction on the inside of your mouth or nose? Yes  Did you need to seek medical attention at a hospital or doctor's office? Yes  When did it last happen?      50+ years  If all above answers are "NO", may proceed with cephalosporin use.  Other Reaction(s): Not available   Nickel Itching and Rash   Penicillin G Rash    Tolerated Cephalosporin Date: 06/17/23.       DIAGNOSTIC DATA (LABS, IMAGING, TESTING) - I reviewed patient records, labs, notes, testing and imaging myself where available.  Lab Results  Component Value Date   WBC 11.6 (H) 06/17/2023   HGB 11.8 (L) 06/17/2023   HCT 36.6 (L) 06/17/2023   MCV 95.8 06/17/2023   PLT 175 06/17/2023      Component Value Date/Time   NA 135 06/17/2023 0336   NA 140 09/19/2022 0746   K 4.3 06/17/2023 0336   CL 102 06/17/2023 0336   CO2 26 06/17/2023 0336   GLUCOSE 121 (H) 06/17/2023 0336   BUN 22 06/17/2023 0336   BUN 34 (H) 09/19/2022 0746   CREATININE 1.02 06/17/2023 0336   CREATININE 1.43 (H) 04/11/2022 1000   CALCIUM 8.5 (L) 06/17/2023 0336   PROT 6.1 (L) 04/07/2023 0822   PROT 6.0 09/03/2021 0749   ALBUMIN 4.1  04/07/2023 0822   ALBUMIN 4.3 09/03/2021 0749   AST 18 04/07/2023 0822   AST 20 04/11/2022 1000   ALT 18 04/07/2023 0822   ALT 28 04/11/2022 1000   ALKPHOS 47 04/07/2023 0822   BILITOT 1.3 (H) 04/07/2023 0822   BILITOT 1.3 (H) 04/11/2022 1000   GFRNONAA >60 06/17/2023 0336   GFRNONAA 50 (L) 04/11/2022 1000   GFRAA >60 11/22/2019 1023   Lab Results  Component Value Date   CHOL 160 10/29/2022   HDL 73 10/29/2022   LDLCALC 75 10/29/2022   TRIG 60 10/29/2022   CHOLHDL 2.2 10/29/2022   No results found for: "HGBA1C" Lab Results  Component Value Date   VITAMINB12 355 05/24/2019   Lab Results  Component Value Date  TSH 0.673 03/23/2022    PHYSICAL EXAM:  Today's Vitals   07/09/23 1330  BP: (!) 107/58  Pulse: 76  Weight: 218 lb (98.9 kg)  Height: 6\' 1"  (1.854 m)   Body mass index is 28.76 kg/m.   Wt Readings from Last 3 Encounters:  07/09/23 218 lb (98.9 kg)  06/25/23 217 lb 6.4 oz (98.6 kg)  06/16/23 211 lb (95.7 kg)     Ht Readings from Last 3 Encounters:  07/09/23 6\' 1"  (1.854 m)  06/25/23 6\' 1"  (1.854 m)  06/16/23 6\' 1"  (1.854 m)      General: The patient is awake, alert and appears not in acute distress. The patient is well groomed. Head: Normocephalic, atraumatic.  Neck is supple. Cardiovascular: regular rate and cardiac rhythm by pulse,  ablation last summer 24   without distended neck veins. Respiratory: Lungs are clear to auscultation.  Skin:  Without evidence of ankle edema, or rash. Trunk: The patient's posture is erect.   NEUROLOGIC EXAM: The patient is awake and alert, oriented to place and time.   Memory subjective described as intact.  Attention span & concentration ability appears normal.  Speech is fluent,  without  dysarthria, dysphonia or aphasia.  Mood and affect are appropriate.   Cranial nerves: no loss of smell or taste reported  Pupils are equal and briskly reactive to light.  Funduscopic exam; deferred   Extraocular movements  in vertical and horizontal planes were intact and without nystagmus. No Diplopia. Visual fields by finger perimetry are intact. Hearing was intact to soft voice and finger rubbing.    Facial sensation intact to fine touch.  Facial motor strength is symmetric and tongue and uvula move midline.  Neck ROM : rotation, tilt and flexion extension were normal for age and shoulder shrug was symmetrical.    Motor exam:  knee surgery limited the  exam.     ASSESSMENT AND PLAN 83 y.o. year old male  here with:  Atrial fib on loop recorder. Status post  second ablation.      1) insomnia, painful neuropathy and now pain- postoperative, this all affects sleep- offered 7 days of tylenol # 3.    2) Mild cognitive impairment,  by MOCA 26/ 30 /   3) no ATN today.   The couple will discuss the  pro and contra of AD diagnostic by biomarkers and genetic testing.    I plan to follow up either personally or through our NP within 6-7 months. MOCA.   I would like to thank [name], MD and [name], Md [address] [place],  [place] [address] for allowing me to meet with and to take care of this pleasant patient.     After spending a total time of  35  minutes face to face and additional time for physical and neurologic examination, review of laboratory studies,  personal review of imaging studies, reports and results of other testing and review of referral information / records as far as provided in visit,   Electronically signed by: Melvyn Novas, MD 07/09/2023 2:19 PM  [facility] Board certified by The [employer] of Sleep Medicine and Diplomate of the [employer] of Sleep Medicine. Board certified In Neurology through the ABPN, Fellow of the [employer] of Neurology.

## 2023-07-09 NOTE — Patient Instructions (Signed)
   ASSESSMENT AND PLAN 83 y.o. year old male  here with:   Atrial fib on loop recorder. Status post  second ablation.        1) insomnia, painful neuropathy and now pain- postoperative, this all affects sleep- offered 7 days of tylenol # 3.  Sleep study discussed but postponed, the pain will overlay everything for now.      2) Mild cognitive impairment,  by MOCA 26/ 30 /    3) no ATN today.   The couple will discuss the  pro and contra of AD diagnostic by biomarkers and genetic testing.      I plan to follow up either personally or through our NP within 6-7 months. MOCA.    I would like to thank Job Mulch, MD and Job Mulch, Md [address] [place],  [place] [address] for allowing me to meet with and to take care of this pleasant patient.

## 2023-07-10 ENCOUNTER — Other Ambulatory Visit: Payer: Self-pay

## 2023-07-10 DIAGNOSIS — R2681 Unsteadiness on feet: Secondary | ICD-10-CM | POA: Diagnosis not present

## 2023-07-10 DIAGNOSIS — Z96651 Presence of right artificial knee joint: Secondary | ICD-10-CM | POA: Diagnosis not present

## 2023-07-10 DIAGNOSIS — Z7409 Other reduced mobility: Secondary | ICD-10-CM | POA: Diagnosis not present

## 2023-07-10 DIAGNOSIS — M1711 Unilateral primary osteoarthritis, right knee: Secondary | ICD-10-CM | POA: Diagnosis not present

## 2023-07-10 DIAGNOSIS — R278 Other lack of coordination: Secondary | ICD-10-CM | POA: Diagnosis not present

## 2023-07-14 ENCOUNTER — Ambulatory Visit: Payer: Medicare Other

## 2023-07-14 ENCOUNTER — Telehealth: Payer: Self-pay | Admitting: Neurology

## 2023-07-14 DIAGNOSIS — R278 Other lack of coordination: Secondary | ICD-10-CM | POA: Diagnosis not present

## 2023-07-14 DIAGNOSIS — I480 Paroxysmal atrial fibrillation: Secondary | ICD-10-CM

## 2023-07-14 DIAGNOSIS — R2681 Unsteadiness on feet: Secondary | ICD-10-CM | POA: Diagnosis not present

## 2023-07-14 DIAGNOSIS — Z96651 Presence of right artificial knee joint: Secondary | ICD-10-CM | POA: Diagnosis not present

## 2023-07-14 DIAGNOSIS — M1711 Unilateral primary osteoarthritis, right knee: Secondary | ICD-10-CM | POA: Diagnosis not present

## 2023-07-14 DIAGNOSIS — Z7409 Other reduced mobility: Secondary | ICD-10-CM | POA: Diagnosis not present

## 2023-07-14 MED ORDER — ACETAMINOPHEN-CODEINE 300-30 MG PO TABS: 1.0000 | ORAL_TABLET | Freq: Four times a day (QID) | ORAL | 0 refills | Status: AC | PRN

## 2023-07-14 NOTE — Telephone Encounter (Signed)
 Teasia @ [facility] has called re: the acetaminophen -codeine  (TYLENOL  #3) 300-30 MG tablet , she reports that pt does not have authorization thru [place] for a Neurologist, she states pt is asking if [id number] .  If Teasia @ [facility] needs to be contacted her direct contact is [phone]

## 2023-07-14 NOTE — Telephone Encounter (Signed)
 Spoke w/Teasia at [facility] to make her aware we had cancelled this script on Thursday 4/17 as it was sent to [facility] in error. Sean Stanley stated understanding. Stated Pt is saying he still has not received a call from [facility] saying [facility] is ready. [facility] and routed to provider for signature.

## 2023-07-15 LAB — CUP PACEART REMOTE DEVICE CHECK
Date Time Interrogation Session: 20250420230957
Implantable Pulse Generator Implant Date: 20230105

## 2023-07-16 DIAGNOSIS — Z7409 Other reduced mobility: Secondary | ICD-10-CM | POA: Diagnosis not present

## 2023-07-16 DIAGNOSIS — R278 Other lack of coordination: Secondary | ICD-10-CM | POA: Diagnosis not present

## 2023-07-16 DIAGNOSIS — R2681 Unsteadiness on feet: Secondary | ICD-10-CM | POA: Diagnosis not present

## 2023-07-16 DIAGNOSIS — Z96651 Presence of right artificial knee joint: Secondary | ICD-10-CM | POA: Diagnosis not present

## 2023-07-16 DIAGNOSIS — M1711 Unilateral primary osteoarthritis, right knee: Secondary | ICD-10-CM | POA: Diagnosis not present

## 2023-07-18 DIAGNOSIS — R278 Other lack of coordination: Secondary | ICD-10-CM | POA: Diagnosis not present

## 2023-07-18 DIAGNOSIS — Z7409 Other reduced mobility: Secondary | ICD-10-CM | POA: Diagnosis not present

## 2023-07-18 DIAGNOSIS — Z96651 Presence of right artificial knee joint: Secondary | ICD-10-CM | POA: Diagnosis not present

## 2023-07-18 DIAGNOSIS — R2681 Unsteadiness on feet: Secondary | ICD-10-CM | POA: Diagnosis not present

## 2023-07-18 DIAGNOSIS — M1711 Unilateral primary osteoarthritis, right knee: Secondary | ICD-10-CM | POA: Diagnosis not present

## 2023-07-21 ENCOUNTER — Encounter: Payer: Self-pay | Admitting: Cardiovascular Disease

## 2023-07-21 DIAGNOSIS — R2681 Unsteadiness on feet: Secondary | ICD-10-CM | POA: Diagnosis not present

## 2023-07-21 DIAGNOSIS — Z7409 Other reduced mobility: Secondary | ICD-10-CM | POA: Diagnosis not present

## 2023-07-21 DIAGNOSIS — Z96651 Presence of right artificial knee joint: Secondary | ICD-10-CM | POA: Diagnosis not present

## 2023-07-21 DIAGNOSIS — M1711 Unilateral primary osteoarthritis, right knee: Secondary | ICD-10-CM | POA: Diagnosis not present

## 2023-07-21 DIAGNOSIS — R278 Other lack of coordination: Secondary | ICD-10-CM | POA: Diagnosis not present

## 2023-07-21 NOTE — Progress Notes (Unsigned)
 Cardiology Office Note:  .   Date:  07/22/2023  ID:  Sean Stanley, DOB [birth year], MRN [id number] PCP: Job Mulch, MD  [facility] Providers Cardiologist:  Sean Stanley Electrophysiologist:  Will Cortland Ding, MD    History of Present Illness: .  Oct. 28, 2024   Sean Stanley is a 83 y.o. male , previous patient of Dr. Felipe Horton, Dr.  Ardell Beauvais,  Seen with wife, Haskell Linker    I am seeing him for the first time today  Hx of MV repair ( at [facility])  Atrial flutter Multiple aortic aneurysms Had spontaneous rupture of a renal artery aneurims   Hx of mantle cell B non Hodgkins lymphoma   S/p recent  Afib ablation by Dr. 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 The neuropathy has been gradually worseing. 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  He will be able to hold his Eliquis  for 2 to 3 days prior to the procedure if he decides to proceed.   July 22, 2023 Sean Stanley is seen for follow up of his paf Hx of afib ablation Had R TKA  No cardiac issues  Is on eliquis       ROS:   Studies Reviewed: .         Risk Assessment/Calculations:    CHA2DS2-VASc Score = 5  This indicates a 7.2% annual risk of stroke. The patient's score is based  upon: CHF History: 1 HTN History: 1 Diabetes History: 0 Stroke History: 0 Vascular Disease History: 1 Age Score: 2 Gender Score: 0      Physical Exam: Blood pressure 114/68, pulse 72, height 6\' 1"  (1.854 m), weight 214 lb 12.8 oz (97.4 kg), SpO2 96%.       GEN:  Well nourished, well developed in no acute distress HEENT: Normal NECK: No JVD; No carotid bruits LYMPHATICS: No lymphadenopathy CARDIAC: RRR , no murmurs, rubs, gallops RESPIRATORY:  Clear to auscultation without rales, wheezing or rhonchi  ABDOMEN: Soft, non-tender, non-distended MUSCULOSKELETAL:  No edema; No deformity  SKIN: Warm and dry NEUROLOGIC:  Alert and oriented x 3   ECG :  EKG Interpretation Date/Time:  Tuesday July 22 2023 11:13:49 EDT Ventricular Rate:  73 PR Interval:  186 QRS Duration:  106 QT Interval:  420 QTC Calculation: 462 R Axis:   1  Text Interpretation: Normal sinus rhythm Minimal voltage criteria for LVH, may be normal variant ( R in aVL ) When compared with ECG of 26-May-2023 15:59, No significant change was found Confirmed by Ahmad Alert ([id number]) on 07/22/2023 11:52:05 AM    ASSESSMENT AND PLAN: .  1.  Paroxysmal atrial fibrillation:  Sean Stanley is maintaining NSR .  Is doing well    2.  Mitral valve repair:   MV sound great .  Continue current meds.           Dispo:    Signed, Ahmad Alert, MD

## 2023-07-22 ENCOUNTER — Encounter: Payer: Self-pay | Admitting: Cardiovascular Disease

## 2023-07-22 ENCOUNTER — Ambulatory Visit: Payer: Medicare Other | Attending: Cardiovascular Disease | Admitting: Cardiovascular Disease

## 2023-07-22 ENCOUNTER — Encounter: Payer: Medicare Other | Admitting: Physical Medicine and Rehabilitation

## 2023-07-22 VITALS — BP 114/68 | HR 72 | Ht 73.0 in | Wt 214.8 lb

## 2023-07-22 DIAGNOSIS — I480 Paroxysmal atrial fibrillation: Secondary | ICD-10-CM | POA: Insufficient documentation

## 2023-07-22 DIAGNOSIS — Z01810 Encounter for preprocedural cardiovascular examination: Secondary | ICD-10-CM | POA: Insufficient documentation

## 2023-07-22 NOTE — Patient Instructions (Signed)
 Follow-Up: At [facility], you and your health needs are our priority.  As part of our continuing mission to provide you with exceptional heart care, our providers are all part of one team.  This team includes your primary Cardiologist (physician) and Advanced Practice Providers or APPs (Physician Assistants and Nurse Practitioners) who all work together to provide you with the care you need, when you need it.  Your next appointment:   1 year(s)  Provider:   Ahmad Alert, MD

## 2023-07-23 DIAGNOSIS — R2681 Unsteadiness on feet: Secondary | ICD-10-CM | POA: Diagnosis not present

## 2023-07-23 DIAGNOSIS — Z96651 Presence of right artificial knee joint: Secondary | ICD-10-CM | POA: Diagnosis not present

## 2023-07-23 DIAGNOSIS — Z7409 Other reduced mobility: Secondary | ICD-10-CM | POA: Diagnosis not present

## 2023-07-23 DIAGNOSIS — R278 Other lack of coordination: Secondary | ICD-10-CM | POA: Diagnosis not present

## 2023-07-23 DIAGNOSIS — M1711 Unilateral primary osteoarthritis, right knee: Secondary | ICD-10-CM | POA: Diagnosis not present

## 2023-07-23 NOTE — Addendum Note (Signed)
 Addended by: Edra Govern D on: 07/23/2023 11:05 AM   Modules accepted: Orders

## 2023-07-23 NOTE — Progress Notes (Signed)
 Carelink Summary Report / Loop Recorder

## 2023-07-25 DIAGNOSIS — Z7409 Other reduced mobility: Secondary | ICD-10-CM | POA: Diagnosis not present

## 2023-07-25 DIAGNOSIS — M1711 Unilateral primary osteoarthritis, right knee: Secondary | ICD-10-CM | POA: Diagnosis not present

## 2023-07-25 DIAGNOSIS — R278 Other lack of coordination: Secondary | ICD-10-CM | POA: Diagnosis not present

## 2023-07-25 DIAGNOSIS — Z96651 Presence of right artificial knee joint: Secondary | ICD-10-CM | POA: Diagnosis not present

## 2023-07-28 DIAGNOSIS — Z96651 Presence of right artificial knee joint: Secondary | ICD-10-CM | POA: Diagnosis not present

## 2023-07-28 DIAGNOSIS — M1711 Unilateral primary osteoarthritis, right knee: Secondary | ICD-10-CM | POA: Diagnosis not present

## 2023-07-28 DIAGNOSIS — Z7409 Other reduced mobility: Secondary | ICD-10-CM | POA: Diagnosis not present

## 2023-07-28 DIAGNOSIS — R278 Other lack of coordination: Secondary | ICD-10-CM | POA: Diagnosis not present

## 2023-07-30 DIAGNOSIS — Z7409 Other reduced mobility: Secondary | ICD-10-CM | POA: Diagnosis not present

## 2023-07-30 DIAGNOSIS — Z96651 Presence of right artificial knee joint: Secondary | ICD-10-CM | POA: Diagnosis not present

## 2023-07-30 DIAGNOSIS — M1711 Unilateral primary osteoarthritis, right knee: Secondary | ICD-10-CM | POA: Diagnosis not present

## 2023-07-30 DIAGNOSIS — R278 Other lack of coordination: Secondary | ICD-10-CM | POA: Diagnosis not present

## 2023-08-01 DIAGNOSIS — M1711 Unilateral primary osteoarthritis, right knee: Secondary | ICD-10-CM | POA: Diagnosis not present

## 2023-08-01 DIAGNOSIS — Z7409 Other reduced mobility: Secondary | ICD-10-CM | POA: Diagnosis not present

## 2023-08-01 DIAGNOSIS — Z96651 Presence of right artificial knee joint: Secondary | ICD-10-CM | POA: Diagnosis not present

## 2023-08-01 DIAGNOSIS — R278 Other lack of coordination: Secondary | ICD-10-CM | POA: Diagnosis not present

## 2023-08-04 DIAGNOSIS — Z7409 Other reduced mobility: Secondary | ICD-10-CM | POA: Diagnosis not present

## 2023-08-04 DIAGNOSIS — Z96651 Presence of right artificial knee joint: Secondary | ICD-10-CM | POA: Diagnosis not present

## 2023-08-04 DIAGNOSIS — M1711 Unilateral primary osteoarthritis, right knee: Secondary | ICD-10-CM | POA: Diagnosis not present

## 2023-08-04 DIAGNOSIS — R278 Other lack of coordination: Secondary | ICD-10-CM | POA: Diagnosis not present

## 2023-08-06 DIAGNOSIS — Z96651 Presence of right artificial knee joint: Secondary | ICD-10-CM | POA: Diagnosis not present

## 2023-08-06 DIAGNOSIS — M1711 Unilateral primary osteoarthritis, right knee: Secondary | ICD-10-CM | POA: Diagnosis not present

## 2023-08-06 DIAGNOSIS — R278 Other lack of coordination: Secondary | ICD-10-CM | POA: Diagnosis not present

## 2023-08-06 DIAGNOSIS — Z7409 Other reduced mobility: Secondary | ICD-10-CM | POA: Diagnosis not present

## 2023-08-08 DIAGNOSIS — R278 Other lack of coordination: Secondary | ICD-10-CM | POA: Diagnosis not present

## 2023-08-08 DIAGNOSIS — Z7409 Other reduced mobility: Secondary | ICD-10-CM | POA: Diagnosis not present

## 2023-08-08 DIAGNOSIS — M1711 Unilateral primary osteoarthritis, right knee: Secondary | ICD-10-CM | POA: Diagnosis not present

## 2023-08-08 DIAGNOSIS — Z96651 Presence of right artificial knee joint: Secondary | ICD-10-CM | POA: Diagnosis not present

## 2023-08-11 DIAGNOSIS — M1711 Unilateral primary osteoarthritis, right knee: Secondary | ICD-10-CM | POA: Diagnosis not present

## 2023-08-11 DIAGNOSIS — Z7409 Other reduced mobility: Secondary | ICD-10-CM | POA: Diagnosis not present

## 2023-08-11 DIAGNOSIS — R278 Other lack of coordination: Secondary | ICD-10-CM | POA: Diagnosis not present

## 2023-08-11 DIAGNOSIS — Z96651 Presence of right artificial knee joint: Secondary | ICD-10-CM | POA: Diagnosis not present

## 2023-08-13 DIAGNOSIS — Z7409 Other reduced mobility: Secondary | ICD-10-CM | POA: Diagnosis not present

## 2023-08-13 DIAGNOSIS — R278 Other lack of coordination: Secondary | ICD-10-CM | POA: Diagnosis not present

## 2023-08-13 DIAGNOSIS — M1711 Unilateral primary osteoarthritis, right knee: Secondary | ICD-10-CM | POA: Diagnosis not present

## 2023-08-13 DIAGNOSIS — Z96651 Presence of right artificial knee joint: Secondary | ICD-10-CM | POA: Diagnosis not present

## 2023-08-15 DIAGNOSIS — M1711 Unilateral primary osteoarthritis, right knee: Secondary | ICD-10-CM | POA: Diagnosis not present

## 2023-08-15 DIAGNOSIS — R278 Other lack of coordination: Secondary | ICD-10-CM | POA: Diagnosis not present

## 2023-08-15 DIAGNOSIS — Z7409 Other reduced mobility: Secondary | ICD-10-CM | POA: Diagnosis not present

## 2023-08-15 DIAGNOSIS — Z96651 Presence of right artificial knee joint: Secondary | ICD-10-CM | POA: Diagnosis not present

## 2023-08-18 DIAGNOSIS — Z96651 Presence of right artificial knee joint: Secondary | ICD-10-CM | POA: Diagnosis not present

## 2023-08-18 DIAGNOSIS — M1711 Unilateral primary osteoarthritis, right knee: Secondary | ICD-10-CM | POA: Diagnosis not present

## 2023-08-18 DIAGNOSIS — R278 Other lack of coordination: Secondary | ICD-10-CM | POA: Diagnosis not present

## 2023-08-18 DIAGNOSIS — Z7409 Other reduced mobility: Secondary | ICD-10-CM | POA: Diagnosis not present

## 2023-08-19 ENCOUNTER — Ambulatory Visit (INDEPENDENT_AMBULATORY_CARE_PROVIDER_SITE_OTHER): Payer: Medicare Other

## 2023-08-19 DIAGNOSIS — I480 Paroxysmal atrial fibrillation: Secondary | ICD-10-CM | POA: Diagnosis not present

## 2023-08-19 LAB — CUP PACEART REMOTE DEVICE CHECK
Date Time Interrogation Session: 20250526231234
Implantable Pulse Generator Implant Date: 20230105

## 2023-08-20 ENCOUNTER — Ambulatory Visit: Payer: Self-pay | Admitting: Cardiology

## 2023-08-20 DIAGNOSIS — Z96651 Presence of right artificial knee joint: Secondary | ICD-10-CM | POA: Diagnosis not present

## 2023-08-20 DIAGNOSIS — M1711 Unilateral primary osteoarthritis, right knee: Secondary | ICD-10-CM | POA: Diagnosis not present

## 2023-08-20 DIAGNOSIS — R278 Other lack of coordination: Secondary | ICD-10-CM | POA: Diagnosis not present

## 2023-08-20 DIAGNOSIS — Z7409 Other reduced mobility: Secondary | ICD-10-CM | POA: Diagnosis not present

## 2023-08-22 DIAGNOSIS — Z7409 Other reduced mobility: Secondary | ICD-10-CM | POA: Diagnosis not present

## 2023-08-22 DIAGNOSIS — M1711 Unilateral primary osteoarthritis, right knee: Secondary | ICD-10-CM | POA: Diagnosis not present

## 2023-08-22 DIAGNOSIS — R278 Other lack of coordination: Secondary | ICD-10-CM | POA: Diagnosis not present

## 2023-08-22 DIAGNOSIS — Z96651 Presence of right artificial knee joint: Secondary | ICD-10-CM | POA: Diagnosis not present

## 2023-08-25 ENCOUNTER — Other Ambulatory Visit (HOSPITAL_COMMUNITY): Payer: Self-pay | Admitting: Internal Medicine

## 2023-08-25 ENCOUNTER — Telehealth (HOSPITAL_BASED_OUTPATIENT_CLINIC_OR_DEPARTMENT_OTHER): Payer: Self-pay | Admitting: Cardiovascular Disease

## 2023-08-25 ENCOUNTER — Ambulatory Visit (HOSPITAL_COMMUNITY)
Admission: RE | Admit: 2023-08-25 | Discharge: 2023-08-25 | Disposition: A | Discharge status: Home / Self Care | Source: Ambulatory Visit | Attending: Surgery | Admitting: Surgery

## 2023-08-25 DIAGNOSIS — Z96659 Presence of unspecified artificial knee joint: Secondary | ICD-10-CM | POA: Diagnosis not present

## 2023-08-25 DIAGNOSIS — R278 Other lack of coordination: Secondary | ICD-10-CM | POA: Diagnosis not present

## 2023-08-25 DIAGNOSIS — M7989 Other specified soft tissue disorders: Secondary | ICD-10-CM

## 2023-08-25 DIAGNOSIS — M79604 Pain in right leg: Secondary | ICD-10-CM | POA: Diagnosis not present

## 2023-08-25 DIAGNOSIS — M1711 Unilateral primary osteoarthritis, right knee: Secondary | ICD-10-CM | POA: Diagnosis not present

## 2023-08-25 DIAGNOSIS — Z96651 Presence of right artificial knee joint: Secondary | ICD-10-CM | POA: Diagnosis not present

## 2023-08-25 DIAGNOSIS — Z7409 Other reduced mobility: Secondary | ICD-10-CM | POA: Diagnosis not present

## 2023-08-25 NOTE — Telephone Encounter (Signed)
 Patient's wife stopped by to follow up on request for patient to transfer to Dr Gerlene Koh panel. No note in chart regarding this request, but patient's wife reports that the request was made at patient's appt on 07/22/23 with Dr Alroy Aspen.

## 2023-08-26 ENCOUNTER — Encounter: Payer: Self-pay | Admitting: Cardiovascular Disease

## 2023-08-26 NOTE — Addendum Note (Signed)
 Addended by: Keller Patella on: 08/26/2023 10:57 AM   Modules accepted: Orders

## 2023-08-27 ENCOUNTER — Telehealth: Payer: Self-pay

## 2023-08-27 DIAGNOSIS — R278 Other lack of coordination: Secondary | ICD-10-CM | POA: Diagnosis not present

## 2023-08-27 DIAGNOSIS — M1711 Unilateral primary osteoarthritis, right knee: Secondary | ICD-10-CM | POA: Diagnosis not present

## 2023-08-27 DIAGNOSIS — Z7409 Other reduced mobility: Secondary | ICD-10-CM | POA: Diagnosis not present

## 2023-08-27 DIAGNOSIS — Z96651 Presence of right artificial knee joint: Secondary | ICD-10-CM | POA: Diagnosis not present

## 2023-08-27 NOTE — Telephone Encounter (Addendum)
 ILR @ RRT   ILR reached RRT:   08/26/2023 Patient message sent via MyChart, discussed options to leave device in or explanted.  Patient would like to leave device in.  Marked "I" in Paceart: done Enter note in Paceart:  dpne Canceled future remotes:  done Discontinued from website:  done Entered in Speciality Comments:  done  Advised if further questions arise to please call the device clinic at [phone].

## 2023-08-29 DIAGNOSIS — Z7409 Other reduced mobility: Secondary | ICD-10-CM | POA: Diagnosis not present

## 2023-08-29 DIAGNOSIS — Z96651 Presence of right artificial knee joint: Secondary | ICD-10-CM | POA: Diagnosis not present

## 2023-08-29 DIAGNOSIS — R278 Other lack of coordination: Secondary | ICD-10-CM | POA: Diagnosis not present

## 2023-08-29 DIAGNOSIS — M1711 Unilateral primary osteoarthritis, right knee: Secondary | ICD-10-CM | POA: Diagnosis not present

## 2023-08-29 NOTE — Progress Notes (Signed)
 Carelink Summary Report / Loop Recorder

## 2023-08-29 NOTE — Addendum Note (Signed)
 Addended by: Edra Govern D on: 08/29/2023 12:06 PM   Modules accepted: Orders

## 2023-09-01 DIAGNOSIS — Z7409 Other reduced mobility: Secondary | ICD-10-CM | POA: Diagnosis not present

## 2023-09-01 DIAGNOSIS — R278 Other lack of coordination: Secondary | ICD-10-CM | POA: Diagnosis not present

## 2023-09-01 DIAGNOSIS — Z96651 Presence of right artificial knee joint: Secondary | ICD-10-CM | POA: Diagnosis not present

## 2023-09-01 DIAGNOSIS — M1711 Unilateral primary osteoarthritis, right knee: Secondary | ICD-10-CM | POA: Diagnosis not present

## 2023-09-03 DIAGNOSIS — Z7409 Other reduced mobility: Secondary | ICD-10-CM | POA: Diagnosis not present

## 2023-09-03 DIAGNOSIS — R278 Other lack of coordination: Secondary | ICD-10-CM | POA: Diagnosis not present

## 2023-09-03 DIAGNOSIS — M1711 Unilateral primary osteoarthritis, right knee: Secondary | ICD-10-CM | POA: Diagnosis not present

## 2023-09-03 DIAGNOSIS — Z96651 Presence of right artificial knee joint: Secondary | ICD-10-CM | POA: Diagnosis not present

## 2023-09-05 DIAGNOSIS — Z96651 Presence of right artificial knee joint: Secondary | ICD-10-CM | POA: Diagnosis not present

## 2023-09-05 DIAGNOSIS — R278 Other lack of coordination: Secondary | ICD-10-CM | POA: Diagnosis not present

## 2023-09-05 DIAGNOSIS — M1711 Unilateral primary osteoarthritis, right knee: Secondary | ICD-10-CM | POA: Diagnosis not present

## 2023-09-05 DIAGNOSIS — Z7409 Other reduced mobility: Secondary | ICD-10-CM | POA: Diagnosis not present

## 2023-09-08 DIAGNOSIS — M1711 Unilateral primary osteoarthritis, right knee: Secondary | ICD-10-CM | POA: Diagnosis not present

## 2023-09-08 DIAGNOSIS — Z96651 Presence of right artificial knee joint: Secondary | ICD-10-CM | POA: Diagnosis not present

## 2023-09-08 DIAGNOSIS — R278 Other lack of coordination: Secondary | ICD-10-CM | POA: Diagnosis not present

## 2023-09-08 DIAGNOSIS — Z7409 Other reduced mobility: Secondary | ICD-10-CM | POA: Diagnosis not present

## 2023-09-10 DIAGNOSIS — Z7409 Other reduced mobility: Secondary | ICD-10-CM | POA: Diagnosis not present

## 2023-09-10 DIAGNOSIS — M1711 Unilateral primary osteoarthritis, right knee: Secondary | ICD-10-CM | POA: Diagnosis not present

## 2023-09-10 DIAGNOSIS — Z96651 Presence of right artificial knee joint: Secondary | ICD-10-CM | POA: Diagnosis not present

## 2023-09-10 DIAGNOSIS — R278 Other lack of coordination: Secondary | ICD-10-CM | POA: Diagnosis not present

## 2023-09-12 DIAGNOSIS — M1711 Unilateral primary osteoarthritis, right knee: Secondary | ICD-10-CM | POA: Diagnosis not present

## 2023-09-12 DIAGNOSIS — Z96651 Presence of right artificial knee joint: Secondary | ICD-10-CM | POA: Diagnosis not present

## 2023-09-12 DIAGNOSIS — Z7409 Other reduced mobility: Secondary | ICD-10-CM | POA: Diagnosis not present

## 2023-09-12 DIAGNOSIS — R278 Other lack of coordination: Secondary | ICD-10-CM | POA: Diagnosis not present

## 2023-09-15 DIAGNOSIS — Z96651 Presence of right artificial knee joint: Secondary | ICD-10-CM | POA: Diagnosis not present

## 2023-09-15 DIAGNOSIS — R278 Other lack of coordination: Secondary | ICD-10-CM | POA: Diagnosis not present

## 2023-09-15 DIAGNOSIS — Z7409 Other reduced mobility: Secondary | ICD-10-CM | POA: Diagnosis not present

## 2023-09-15 DIAGNOSIS — M1711 Unilateral primary osteoarthritis, right knee: Secondary | ICD-10-CM | POA: Diagnosis not present

## 2023-09-17 DIAGNOSIS — Z96651 Presence of right artificial knee joint: Secondary | ICD-10-CM | POA: Diagnosis not present

## 2023-09-17 DIAGNOSIS — M1711 Unilateral primary osteoarthritis, right knee: Secondary | ICD-10-CM | POA: Diagnosis not present

## 2023-09-17 DIAGNOSIS — Z7409 Other reduced mobility: Secondary | ICD-10-CM | POA: Diagnosis not present

## 2023-09-17 DIAGNOSIS — R278 Other lack of coordination: Secondary | ICD-10-CM | POA: Diagnosis not present

## 2023-09-19 DIAGNOSIS — Z7409 Other reduced mobility: Secondary | ICD-10-CM | POA: Diagnosis not present

## 2023-09-19 DIAGNOSIS — R278 Other lack of coordination: Secondary | ICD-10-CM | POA: Diagnosis not present

## 2023-09-19 DIAGNOSIS — Z96651 Presence of right artificial knee joint: Secondary | ICD-10-CM | POA: Diagnosis not present

## 2023-09-19 DIAGNOSIS — M1711 Unilateral primary osteoarthritis, right knee: Secondary | ICD-10-CM | POA: Diagnosis not present

## 2023-10-01 DIAGNOSIS — R278 Other lack of coordination: Secondary | ICD-10-CM | POA: Diagnosis not present

## 2023-10-01 DIAGNOSIS — Z96651 Presence of right artificial knee joint: Secondary | ICD-10-CM | POA: Diagnosis not present

## 2023-10-01 DIAGNOSIS — M1711 Unilateral primary osteoarthritis, right knee: Secondary | ICD-10-CM | POA: Diagnosis not present

## 2023-10-01 DIAGNOSIS — Z7409 Other reduced mobility: Secondary | ICD-10-CM | POA: Diagnosis not present

## 2023-10-06 NOTE — Progress Notes (Signed)
 Carelink Summary Report / Loop Recorder

## 2023-10-07 DIAGNOSIS — E7800 Pure hypercholesterolemia, unspecified: Secondary | ICD-10-CM | POA: Diagnosis not present

## 2023-10-07 DIAGNOSIS — E890 Postprocedural hypothyroidism: Secondary | ICD-10-CM | POA: Diagnosis not present

## 2023-10-07 DIAGNOSIS — I722 Aneurysm of renal artery: Secondary | ICD-10-CM | POA: Diagnosis not present

## 2023-10-07 DIAGNOSIS — I4891 Unspecified atrial fibrillation: Secondary | ICD-10-CM | POA: Diagnosis not present

## 2023-10-07 DIAGNOSIS — C73 Malignant neoplasm of thyroid gland: Secondary | ICD-10-CM | POA: Diagnosis not present

## 2023-10-09 DIAGNOSIS — E7800 Pure hypercholesterolemia, unspecified: Secondary | ICD-10-CM | POA: Diagnosis not present

## 2023-10-09 DIAGNOSIS — G4733 Obstructive sleep apnea (adult) (pediatric): Secondary | ICD-10-CM | POA: Diagnosis not present

## 2023-10-09 DIAGNOSIS — J383 Other diseases of vocal cords: Secondary | ICD-10-CM | POA: Diagnosis not present

## 2023-10-09 DIAGNOSIS — E890 Postprocedural hypothyroidism: Secondary | ICD-10-CM | POA: Diagnosis not present

## 2023-10-09 DIAGNOSIS — C8310 Mantle cell lymphoma, unspecified site: Secondary | ICD-10-CM | POA: Diagnosis not present

## 2023-10-09 DIAGNOSIS — C73 Malignant neoplasm of thyroid gland: Secondary | ICD-10-CM | POA: Diagnosis not present

## 2023-10-09 DIAGNOSIS — I4891 Unspecified atrial fibrillation: Secondary | ICD-10-CM | POA: Diagnosis not present

## 2023-10-09 DIAGNOSIS — R600 Localized edema: Secondary | ICD-10-CM | POA: Diagnosis not present

## 2023-10-09 DIAGNOSIS — I722 Aneurysm of renal artery: Secondary | ICD-10-CM | POA: Diagnosis not present

## 2023-10-13 ENCOUNTER — Ambulatory Visit: Attending: Internal Medicine | Admitting: Pharmacist

## 2023-10-13 DIAGNOSIS — I2510 Atherosclerotic heart disease of native coronary artery without angina pectoris: Secondary | ICD-10-CM | POA: Insufficient documentation

## 2023-10-13 DIAGNOSIS — E785 Hyperlipidemia, unspecified: Secondary | ICD-10-CM | POA: Diagnosis not present

## 2023-10-13 NOTE — Progress Notes (Signed)
 Patient ID: Sean Stanley                 DOB: December 20, 1940                    MRN: [id number]      HPI: Sean Stanley is a 83 y.o. male patient referred to lipid clinic by Dr.Nahser. 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 Both cancers are in remission.  He was seen in lipid clinic in Dec 2024. Previous not says he was taking rosuvastatin  10mg  three times a week along with ezetimibe . No changes were made as his LDL-C 02/25/23 at the [facility] was 56.   Patient's wife contact clinic in June to let us  know that he had stopped his rosuvastatin  due to muscle aches/chest discomfort.  Patient was scheduled with Pharm.D. to discuss further options.  Patient presents today to lipid clinic.  We discussed options including retrial of rosuvastatin  5 mg 3 times a week as patient states he was previously on 5mg  and at some point increase to 10 mg.  We also discussed Nexletol and Repatha.  We discussed time to benefit data and the data in the elderly population.  We do have data out to about 82 years of age that does show a benefit.  Patient not interested in the newer agents but willing to retry rosuvastatin  5 mg 3 times a week.  He would like to first check labs to see where he is he is at.  He stopped rosuvastatin  on 08/25/2023.   Current Medications: ezetimibe  10 mg daily  Intolerances: Crestor  10 mg daily- muscle aches and pain, pravastatin , simvastatin, rosuvastatin  3x week (myalgias) Risk Factors: Ca score of 602 on most recent CT scan, CAD  LDL goal: <70 mg/dl   Diet:  Breakfast: yogurt and fruit Eats fish twice a week and generally healthy  food. Low sat, low fat food  Snacks: generally none but lately butter cookies, italian cake  Exercise: water  aerobics  Family History:  Relation Problem Comments  Mother (Deceased) Parkinson's disease (Age: 60)     Father (Deceased) Heart failure (Age: 53)     Maternal Grandmother (Deceased at age 17) Cancer     Maternal Grandfather (Deceased at age 10) Heart attack     Paternal Grandmother (Deceased at age 3) Heart attack     Paternal Grandfather (Deceased at age 33)     Social History:  Alcohol : none  Smoking: none   Labs: Lipid Panel     Component Value Date/Time   CHOL 160 10/29/2022 0734   TRIG 60 10/29/2022 0734   HDL 73 10/29/2022 0734   CHOLHDL 2.2 10/29/2022 0734   CHOLHDL 3.2 02/22/2019 0757   VLDL 23 02/22/2019 0757   LDLCALC 75 10/29/2022 0734   LABVLDL 12 10/29/2022 0734    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   s/p coiling 12/16/12 - [facility]   Atrial flutter (HCC)    BPH (benign prostatic hyperplasia)    Cancer (HCC)    melanoma on lower right back and left chest - surgically removed and cleared   Difficult intubation    Dysrhythmia    H/O agent Orange exposure    Headache    migraine- not current   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  Tammy told him he had improved   Paroxysmal atrial fibrillation (HCC) 01/02/2021   Pneumonia    Thoracic ascending aortic aneurysm (HCC)    4.5 cm 03/2018 CT. 4.4 cm on echo 10/2021 and on CTA 03/2021   Thyroid  cancer [facility])     Current Outpatient Medications on File Prior to Visit  Medication Sig Dispense Refill   alfuzosin  (UROXATRAL ) 10 MG 24 hr tablet Take 10 mg by mouth every evening.     apixaban  (ELIQUIS ) 5 MG TABS tablet Take 1 tablet (5 mg total) by mouth 2 (two) times daily. 60 tablet 3   buPROPion  (WELLBUTRIN  XL) 150 MG 24 hr tablet Take 1 tablet (150 mg total) by mouth daily. 90 tablet 3   Cholecalciferol  (VITAMIN D ) 2000 units tablet Take 2,000 Units by mouth at bedtime.      ezetimibe  (ZETIA ) 10 MG tablet Take 1 tablet (10 mg total) by mouth every morning. 90 tablet 2   finasteride  (PROSCAR ) 5 MG tablet Take 5 mg by mouth every evening.     levothyroxine  (SYNTHROID ) 150 MCG tablet Take 150 mcg by mouth daily before breakfast. 1 hour before breakfast     metoprolol  succinate (TOPROL -XL) 25 MG 24 hr tablet Take 12.5 mg by mouth in the morning.     Polyethyl Glycol-Propyl Glycol (SYSTANE HYDRATION PF OP) Place 1 drop into both eyes 3 (three) times daily as needed (Dry eyes).     acetaminophen  (TYLENOL ) 500 MG tablet Take 1,000 mg by mouth.     No current facility-administered medications on file prior to visit.    Allergies  Allergen Reactions   Rosuvastatin  Other (See Comments)    Muscle/chest pain/Myalgias Currently taking M-W-F in the evening   Aspirin Other (See Comments)    Has been instructed not to take any blood thinners even aspirin due to history of several aneurysms     Levaquin [Levofloxacin In D5w]     tendonitis   Levofloxacin Other (See Comments)    Itching, tendonitis   Pravastatin  Other (See Comments)    Muscle aches   Simvastatin Other (See Comments)    Chest pain  Other Reaction(s): myalgias   Allopurinol  Rash   Ampicillin Rash    Did it  involve swelling of the face/tongue/throat, SOB, or low BP? No  Did it involve sudden or severe rash/hives, skin peeling, or any reaction on the inside of your mouth or nose? Yes  Did you need to seek medical attention at a hospital or doctor's office? Yes  When did it last happen?  50+ years  If all above answers are "NO", may proceed with cephalosporin use.  Other Reaction(s): Not available   Nickel Itching and Rash   Penicillin G Rash    Tolerated Cephalosporin Date: 06/17/23.     Rosuvastatin  Calcium  Other (See Comments)    Muscle aches, myalgia, chest wall pain    Assessment/Plan:  1. Hyperlipidemia -  Problem  Hyperlipidemia   Current Medications: Crestor  5 mg 3 time per week and ezetimibe  10 mg daily  Intolerances: Crestor  10 mg daily- muscle aches and pain,  pravastatin , simvastatin, Risk Factors: Ca score of 602 on most recent CT scan, CAD  LDL goal: <70 mg/dl      Hyperlipidemia Assessment: LDL-C previously well-controlled on rosuvastatin  10 mg 3 times a week and ezetimibe  10 mg daily Patient stopped rosuvastatin  due to chest discomfort. We discussed Nexletol, Repatha, retrial of statin Discussed retrial of rosuvastatin  at lower dose Patient uses the [facility], I do think that he might qualify for newer agents through the [facility] Patient requested to check baseline labs on just ezetimibe   Plan: Check lipid panel today We will plan to resume rosuvastatin  5 mg 3 times a week Recheck labs again in 3 months  Thank you,  Taksh Hjort D Payten Hobin, Pharm.JONETTA SARAN, CPP [facility] of [facility] [address] [place], [place] [address]  Phone: [phone]; Fax: [phone]

## 2023-10-13 NOTE — Assessment & Plan Note (Signed)
 Assessment: LDL-C previously well-controlled on rosuvastatin  10 mg 3 times a week and ezetimibe  10 mg daily Patient stopped rosuvastatin  due to chest discomfort. We discussed Nexletol, Repatha, retrial of statin Discussed retrial of rosuvastatin  at lower dose Patient uses the [facility], I do think that he might qualify for newer agents through the [facility] Patient requested to check baseline labs on just ezetimibe   Plan: Check lipid panel today We will plan to resume rosuvastatin  5 mg 3 times a week Recheck labs again in 3 months

## 2023-10-13 NOTE — Patient Instructions (Signed)
 I will comment tomorrow on your cholesterol labs Most likely we will plan to resume rosuvastatin  5mg  three times a week Continue ezetimibe  10mg  daily  Please call me at [phone] with any questions

## 2023-10-14 ENCOUNTER — Ambulatory Visit: Payer: Self-pay | Admitting: Pharmacist

## 2023-10-14 DIAGNOSIS — E785 Hyperlipidemia, unspecified: Secondary | ICD-10-CM

## 2023-10-14 LAB — LIPID PANEL
Chol/HDL Ratio: 2.9 ratio (ref 0.0–5.0)
Cholesterol, Total: 202 mg/dL — ABNORMAL HIGH (ref 100–199)
HDL: 69 mg/dL (ref >39)
LDL Chol Calc (NIH): 117 mg/dL — ABNORMAL HIGH (ref 0–99)
Triglycerides: 89 mg/dL (ref 0–149)
VLDL Cholesterol Cal: 16 mg/dL (ref 5–40)

## 2023-10-14 MED ORDER — ROSUVASTATIN CALCIUM 5 MG PO TABS: ORAL_TABLET | ORAL | 3 refills | Status: AC

## 2023-10-14 MED ORDER — ROSUVASTATIN CALCIUM 5 MG PO TABS: ORAL_TABLET | ORAL | 3 refills | Status: DC

## 2023-10-16 ENCOUNTER — Inpatient Hospital Stay: Payer: Medicare Other

## 2023-10-16 ENCOUNTER — Inpatient Hospital Stay: Payer: Medicare Other | Attending: Hematology and Oncology | Admitting: Hematology and Oncology

## 2023-10-16 ENCOUNTER — Encounter: Payer: Self-pay | Admitting: Hematology and Oncology

## 2023-10-16 VITALS — BP 120/78 | HR 63 | Resp 18 | Ht 73.0 in | Wt 212.4 lb

## 2023-10-16 DIAGNOSIS — C73 Malignant neoplasm of thyroid gland: Secondary | ICD-10-CM

## 2023-10-16 DIAGNOSIS — C8318 Mantle cell lymphoma, lymph nodes of multiple sites: Secondary | ICD-10-CM

## 2023-10-16 DIAGNOSIS — I4891 Unspecified atrial fibrillation: Secondary | ICD-10-CM | POA: Insufficient documentation

## 2023-10-16 DIAGNOSIS — Z85850 Personal history of malignant neoplasm of thyroid: Secondary | ICD-10-CM | POA: Diagnosis not present

## 2023-10-16 LAB — CBC WITH DIFFERENTIAL/PLATELET
Abs Immature Granulocytes: 0.02 K/uL (ref 0.00–0.07)
Basophils Absolute: 0 K/uL (ref 0.0–0.1)
Basophils Relative: 1 %
Eosinophils Absolute: 0.1 K/uL (ref 0.0–0.5)
Eosinophils Relative: 3 %
HCT: 41.4 % (ref 39.0–52.0)
Hemoglobin: 13.2 g/dL (ref 13.0–17.0)
Immature Granulocytes: 0 %
Lymphocytes Relative: 21 %
Lymphs Abs: 1 K/uL (ref 0.7–4.0)
MCH: 28.3 pg (ref 26.0–34.0)
MCHC: 31.9 g/dL (ref 30.0–36.0)
MCV: 88.8 fL (ref 80.0–100.0)
Monocytes Absolute: 0.6 K/uL (ref 0.1–1.0)
Monocytes Relative: 12 %
Neutro Abs: 3.1 K/uL (ref 1.7–7.7)
Neutrophils Relative %: 63 %
Platelets: 172 K/uL (ref 150–400)
RBC: 4.66 MIL/uL (ref 4.22–5.81)
RDW: 13.7 % (ref 11.5–15.5)
WBC: 4.9 K/uL (ref 4.0–10.5)
nRBC: 0 % (ref 0.0–0.2)

## 2023-10-16 LAB — COMPREHENSIVE METABOLIC PANEL WITH GFR
ALT: 13 U/L (ref 0–44)
AST: 17 U/L (ref 15–41)
Albumin: 4.1 g/dL (ref 3.5–5.0)
Alkaline Phosphatase: 73 U/L (ref 38–126)
Anion gap: 4 — ABNORMAL LOW (ref 5–15)
BUN: 26 mg/dL — ABNORMAL HIGH (ref 8–23)
CO2: 30 mmol/L (ref 22–32)
Calcium: 9.1 mg/dL (ref 8.9–10.3)
Chloride: 104 mmol/L (ref 98–111)
Creatinine, Ser: 1.21 mg/dL (ref 0.61–1.24)
GFR, Estimated: 60 mL/min — ABNORMAL LOW (ref >60)
Glucose, Bld: 88 mg/dL (ref 70–99)
Potassium: 4.7 mmol/L (ref 3.5–5.1)
Sodium: 138 mmol/L (ref 135–145)
Total Bilirubin: 1.5 mg/dL — ABNORMAL HIGH (ref 0.0–1.2)
Total Protein: 6.5 g/dL (ref 6.5–8.1)

## 2023-10-16 NOTE — Assessment & Plan Note (Addendum)
 Patient was diagnosed with mantle cell lymphoma in 2020, MIPI score 7.5, high risk He was treated with Bendamustine  and rituximab  with complete response Examination today is benign and he is reassured I will see him once a year He usually gets CT imaging to monitor his aneurysm at [facility] and he will continue the same

## 2023-10-16 NOTE — Assessment & Plan Note (Addendum)
 The patient was diagnosed with papillary thyroid  carcinoma status post surgery in 2020 Pathology: Papillary thyroid  carcinoma  He did not need adjuvant treatment He had routine surveillance imaging, last done in January 2025 which showed no evidence of disease recurrence His examination today is benign

## 2023-10-16 NOTE — Progress Notes (Signed)
 [facility] OFFICE PROGRESS NOTE  Patient Care Team: Charlott Norleen Neighbor, MD as PCP - General (Internal Medicine) Inocencio Soyla Lunger, MD as PCP - Electrophysiology (Cardiology) Raford Riggs, MD as PCP - Cardiology (Cardiology) Blinda Donnice Rigg, MD as Referring Physician (Hematology and Oncology)  Assessment & Plan Thyroid  cancer [facility]) The patient was diagnosed with papillary thyroid  carcinoma status post surgery in 2020 Pathology: Papillary thyroid  carcinoma  He did not need adjuvant treatment He had routine surveillance imaging, last done in January 2025 which showed no evidence of disease recurrence His examination today is benign Mantle cell lymphoma of lymph nodes of multiple regions [facility]) Patient was diagnosed with mantle cell lymphoma in 2020, MIPI score 7.5, high risk He was treated with Bendamustine  and rituximab  with complete response Examination today is benign and he is reassured I will see him once a year He usually gets CT imaging to monitor his aneurysm at [facility] and he will continue the same   No orders of the defined types were placed in this encounter.    Almarie Bedford, MD  INTERVAL HISTORY: he returns for surveillance follow-up for history of mantle cell lymphoma and papillary thyroid  cancer He is doing well Since last time I saw him, he had right knee surgery and ablation of his atrial fibrillation No new lymphadenopathy  PHYSICAL EXAMINATION: ECOG PERFORMANCE STATUS: 0 - Asymptomatic  Vitals:   10/16/23 1026  BP: 120/78  Pulse: 63  Resp: 18  SpO2: 97%   Filed Weights   10/16/23 1026  Weight: 212 lb 6.4 oz (96.3 kg)   GENERAL:alert, no distress and comfortable SKIN: skin color, texture, turgor are normal, no rashes or significant lesions EYES: normal, conjunctiva are pink and non-injected, sclera clear OROPHARYNX:no exudate, no erythema and lips, buccal mucosa, and tongue normal  NECK: Well-healed surgical  scar LYMPH:  no palpable lymphadenopathy in the cervical, axillary or inguinal LUNGS: clear to auscultation and percussion with normal breathing effort HEART: regular rate & rhythm and no murmurs and no lower extremity edema ABDOMEN:abdomen soft, non-tender and normal bowel sounds Musculoskeletal:no cyanosis of digits and no clubbing  PSYCH: alert & oriented x 3 with dysphonia NEURO: no focal motor/sensory deficits  Relevant data reviewed during this visit included CBC and CMP

## 2023-11-05 DIAGNOSIS — M542 Cervicalgia: Secondary | ICD-10-CM | POA: Diagnosis not present

## 2023-11-17 DIAGNOSIS — C8311 Mantle cell lymphoma, lymph nodes of head, face, and neck: Secondary | ICD-10-CM | POA: Diagnosis not present

## 2023-11-17 DIAGNOSIS — C799 Secondary malignant neoplasm of unspecified site: Secondary | ICD-10-CM | POA: Diagnosis not present

## 2023-11-17 DIAGNOSIS — J383 Other diseases of vocal cords: Secondary | ICD-10-CM | POA: Diagnosis not present

## 2023-11-17 DIAGNOSIS — R490 Dysphonia: Secondary | ICD-10-CM | POA: Diagnosis not present

## 2023-11-17 DIAGNOSIS — J3801 Paralysis of vocal cords and larynx, unilateral: Secondary | ICD-10-CM | POA: Diagnosis not present

## 2023-11-18 DIAGNOSIS — M5030 Other cervical disc degeneration, unspecified cervical region: Secondary | ICD-10-CM | POA: Diagnosis not present

## 2023-11-18 DIAGNOSIS — M47812 Spondylosis without myelopathy or radiculopathy, cervical region: Secondary | ICD-10-CM | POA: Diagnosis not present

## 2023-11-18 DIAGNOSIS — M542 Cervicalgia: Secondary | ICD-10-CM | POA: Diagnosis not present

## 2023-11-18 DIAGNOSIS — M4802 Spinal stenosis, cervical region: Secondary | ICD-10-CM | POA: Diagnosis not present

## 2023-11-27 DIAGNOSIS — D225 Melanocytic nevi of trunk: Secondary | ICD-10-CM | POA: Diagnosis not present

## 2023-11-27 DIAGNOSIS — D485 Neoplasm of uncertain behavior of skin: Secondary | ICD-10-CM | POA: Diagnosis not present

## 2023-11-27 DIAGNOSIS — Z85828 Personal history of other malignant neoplasm of skin: Secondary | ICD-10-CM | POA: Diagnosis not present

## 2023-11-27 DIAGNOSIS — L821 Other seborrheic keratosis: Secondary | ICD-10-CM | POA: Diagnosis not present

## 2023-11-27 DIAGNOSIS — L814 Other melanin hyperpigmentation: Secondary | ICD-10-CM | POA: Diagnosis not present

## 2023-11-27 DIAGNOSIS — D1801 Hemangioma of skin and subcutaneous tissue: Secondary | ICD-10-CM | POA: Diagnosis not present

## 2023-11-27 DIAGNOSIS — D1721 Benign lipomatous neoplasm of skin and subcutaneous tissue of right arm: Secondary | ICD-10-CM | POA: Diagnosis not present

## 2023-11-27 DIAGNOSIS — L438 Other lichen planus: Secondary | ICD-10-CM | POA: Diagnosis not present

## 2023-12-10 ENCOUNTER — Encounter (HOSPITAL_BASED_OUTPATIENT_CLINIC_OR_DEPARTMENT_OTHER): Payer: Self-pay

## 2023-12-10 NOTE — Progress Notes (Unsigned)
 Electrophysiology Office Note:   Date:  12/11/2023  ID:  Sean Stanley, DOB [birth year], MRN [id number]  Primary Cardiologist: Annabella Scarce, MD Primary Heart Failure: None Electrophysiologist: Will Gladis Norton, MD      History of Present Illness:   Sean Stanley is a 83 y.o. male with h/o AFL, AF, NICM, minimally invasive MV Repair 2002 at [facility], RP bleeding in setting of abdominal aortic aneurysms s/p coiling 11/2012, mantle cell B Non-Hodgkin & Papillary thyroid  cancer sp thyroidectomy  seen today for routine electrophysiology followup.   Seen in EP Clinic 05/26/23 and was doing well. He was cleared for a right knee surgery.   Since last being seen in our clinic the patient reports he has recovered well from his knee surgery. He still has some residual swelling and wears compression stockings.  He did not have any AF after the surgery.  Notes that every now and again he will have a bubbling in his chest but is fleeting / brief. No missed doses of Eliquis  and no bleeding issues.   He denies chest pain, palpitations, dyspnea, PND, orthopnea, nausea, vomiting, dizziness, syncope, edema, weight gain, or early satiety.   Review of systems complete and found to be negative unless listed in HPI.   EP Information / Studies Reviewed:    EKG is not ordered today. EKG from 07/22/23 reviewed which showed NSR 73 bpm        Arrhythmia / AAD / Pertinent EP Studies AFL  Amiodarone  03/2022 > 12/2022 stopped due to terrible dreams, d/c'd post ablation  EPS 09/2022 > RF PVI ablation, additional posterior wall LA ablation    Device  MDT ILR 03/29/21 > battery at EOS 08/2023  Risk Assessment/Calculations:    CHA2DS2-VASc Score = 5   This indicates a 7.2% annual risk of stroke. The patient's score is based upon: CHF History: 1 HTN History: 1 Diabetes History: 0 Stroke History: 0 Vascular Disease History: 1 Age Score: 2 Gender Score: 0             Physical Exam:   VS:  BP 134/72    Pulse 66   Ht 6' 1 (1.854 m)   Wt 211 lb 3.2 oz (95.8 kg)   SpO2 96%   BMI 27.86 kg/m    Wt Readings from Last 3 Encounters:  12/11/23 211 lb 3.2 oz (95.8 kg)  10/16/23 212 lb 6.4 oz (96.3 kg)  07/22/23 214 lb 12.8 oz (97.4 kg)     GEN: pleasant, well nourished, well developed in no acute distress NECK: No JVD; No carotid bruits CARDIAC: Regular rate and rhythm, no murmurs, rubs, gallops RESPIRATORY:  Clear to auscultation without rales, wheezing or rhonchi  ABDOMEN: Soft, non-tender, non-distended EXTREMITIES:  LE 1+ edema; No deformity   ASSESSMENT AND PLAN:    Paroxysmal AF AFL CHA2DS2-VASc 5, s/p ablation  -OAC for stroke prophylaxis  -ILR battery at EOS > discussed leaving in place, removal, and removal + re-implant.  Also reviewed wearing a smart watch  -no symptom burden  -has a [employer] > encouraged use if he has symptoms  -discussed if he has recurrent symptoms to let us  know > he asks about options if he has recurrent AF -continue Toprol  12.5 mg daily    Secondary Hypercoagulable State  -continue Eliquis  5mg  BID, dose reviewed and appropriate by wt / Cr   Hypertension  -well controlled on current regimen    Vascular Aneurysms  Hx RP bleed -per VVS   NICM  Recovered LVEF, 50-55% 10/2021   VHD: S/p MV Annuloplasty [facility] at [facility]  -per Cardiology   Follow up with Dr. Inocencio / EP APP in 12 months  Signed, Daphne Barrack, NP-C, AGACNP-BC [facility] - Electrophysiology  12/11/2023, 9:10 AM

## 2023-12-11 ENCOUNTER — Encounter: Payer: Self-pay | Admitting: Pulmonary Disease

## 2023-12-11 ENCOUNTER — Ambulatory Visit: Attending: Pulmonary Disease | Admitting: Pulmonary Disease

## 2023-12-11 VITALS — BP 134/72 | HR 66 | Ht 73.0 in | Wt 211.2 lb

## 2023-12-11 DIAGNOSIS — I428 Other cardiomyopathies: Secondary | ICD-10-CM | POA: Insufficient documentation

## 2023-12-11 DIAGNOSIS — Z95818 Presence of other cardiac implants and grafts: Secondary | ICD-10-CM | POA: Insufficient documentation

## 2023-12-11 DIAGNOSIS — I480 Paroxysmal atrial fibrillation: Secondary | ICD-10-CM | POA: Insufficient documentation

## 2023-12-11 DIAGNOSIS — D6869 Other thrombophilia: Secondary | ICD-10-CM | POA: Diagnosis not present

## 2023-12-11 DIAGNOSIS — Z9889 Other specified postprocedural states: Secondary | ICD-10-CM | POA: Insufficient documentation

## 2023-12-11 DIAGNOSIS — E785 Hyperlipidemia, unspecified: Secondary | ICD-10-CM | POA: Diagnosis not present

## 2023-12-11 NOTE — Patient Instructions (Addendum)
 Medication Instructions:  No medication changes  *If you need a refill on your cardiac medications before your next appointment, please call your pharmacy*  Lab Work: No new lab work for today.  If you have labs (blood work) drawn today and your tests are completely normal, you will receive your results only by: MyChart Message (if you have MyChart) OR A paper copy in the mail If you have any lab test that is abnormal or we need to change your treatment, we will call you to review the results.  Testing/Procedures: None  Follow-Up: At [facility], you and your health needs are our priority.  As part of our continuing mission to provide you with exceptional heart care, our providers are all part of one team.  This team includes your primary Cardiologist (physician) and Advanced Practice Providers or APPs (Physician Assistants and Nurse Practitioners) who all work together to provide you with the care you need, when you need it.  Your next appointment:   12 month(s)  Provider:   You may see Will Gladis Norton, MD or one of the following Advanced Practice Providers on your designated Care Team:    Daphne Barrack, NP    We recommend signing up for the patient portal called MyChart.  Sign up information is provided on this After Visit Summary.  MyChart is used to connect with patients for Virtual Visits (Telemedicine).  Patients are able to view lab/test results, encounter notes, upcoming appointments, etc.  Non-urgent messages can be sent to your provider as well.   To learn more about what you can do with MyChart, go to [email or web address].   Other Instructions No new

## 2023-12-12 DIAGNOSIS — D225 Melanocytic nevi of trunk: Secondary | ICD-10-CM | POA: Diagnosis not present

## 2023-12-12 DIAGNOSIS — D485 Neoplasm of uncertain behavior of skin: Secondary | ICD-10-CM | POA: Diagnosis not present

## 2023-12-12 DIAGNOSIS — Z85828 Personal history of other malignant neoplasm of skin: Secondary | ICD-10-CM | POA: Diagnosis not present

## 2023-12-12 LAB — LIPID PANEL
Chol/HDL Ratio: 2.3 ratio (ref 0.0–5.0)
Cholesterol, Total: 152 mg/dL (ref 100–199)
HDL: 66 mg/dL (ref >39)
LDL Chol Calc (NIH): 73 mg/dL (ref 0–99)
Triglycerides: 67 mg/dL (ref 0–149)
VLDL Cholesterol Cal: 13 mg/dL (ref 5–40)

## 2023-12-12 LAB — HEPATIC FUNCTION PANEL
ALT: 18 IU/L (ref 0–44)
AST: 23 IU/L (ref 0–40)
Albumin: 4.3 g/dL (ref 3.7–4.7)
Alkaline Phosphatase: 81 IU/L (ref 48–129)
Bilirubin Total: 1.5 mg/dL — ABNORMAL HIGH (ref 0.0–1.2)
Bilirubin, Direct: 0.45 mg/dL — ABNORMAL HIGH (ref 0.00–0.40)
Total Protein: 6.2 g/dL (ref 6.0–8.5)

## 2023-12-16 ENCOUNTER — Ambulatory Visit: Attending: Cardiology | Admitting: Pharmacist

## 2023-12-16 DIAGNOSIS — E785 Hyperlipidemia, unspecified: Secondary | ICD-10-CM | POA: Insufficient documentation

## 2023-12-16 DIAGNOSIS — I2510 Atherosclerotic heart disease of native coronary artery without angina pectoris: Secondary | ICD-10-CM | POA: Insufficient documentation

## 2023-12-16 NOTE — Assessment & Plan Note (Signed)
 Assessment: LDL-C is not currently at goal <70 mg/dL Last LDL-C 73 mg/dL on rosuvastatin  5 mg 3 days/week and ezetimibe  10 mg Given patients advanced age, LDL-C of 56 is reasonable Denies any side effects since restarting rosuvastatin  and ezetimibe  Patient instructed to reach out to PharmD if any concerns of side effects come up in the future  Plan: Continue rosuvastatin  5 mg 3 days/week and ezetimibe  10 mg Follow up with PharmD when needed
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
## 2023-12-18 DIAGNOSIS — N400 Enlarged prostate without lower urinary tract symptoms: Secondary | ICD-10-CM | POA: Diagnosis not present

## 2023-12-18 DIAGNOSIS — G6289 Other specified polyneuropathies: Secondary | ICD-10-CM | POA: Diagnosis not present

## 2023-12-18 DIAGNOSIS — I4891 Unspecified atrial fibrillation: Secondary | ICD-10-CM | POA: Diagnosis not present

## 2023-12-18 DIAGNOSIS — Z1331 Encounter for screening for depression: Secondary | ICD-10-CM | POA: Diagnosis not present

## 2023-12-18 DIAGNOSIS — Z0000 Encounter for general adult medical examination without abnormal findings: Secondary | ICD-10-CM | POA: Diagnosis not present

## 2023-12-18 DIAGNOSIS — F418 Other specified anxiety disorders: Secondary | ICD-10-CM | POA: Diagnosis not present

## 2023-12-18 DIAGNOSIS — Z79899 Other long term (current) drug therapy: Secondary | ICD-10-CM | POA: Diagnosis not present

## 2023-12-18 DIAGNOSIS — E7800 Pure hypercholesterolemia, unspecified: Secondary | ICD-10-CM | POA: Diagnosis not present

## 2023-12-18 DIAGNOSIS — C8310 Mantle cell lymphoma, unspecified site: Secondary | ICD-10-CM | POA: Diagnosis not present

## 2023-12-18 DIAGNOSIS — C73 Malignant neoplasm of thyroid gland: Secondary | ICD-10-CM | POA: Diagnosis not present

## 2023-12-18 DIAGNOSIS — E890 Postprocedural hypothyroidism: Secondary | ICD-10-CM | POA: Diagnosis not present

## 2023-12-18 DIAGNOSIS — G4733 Obstructive sleep apnea (adult) (pediatric): Secondary | ICD-10-CM | POA: Diagnosis not present

## 2023-12-18 DIAGNOSIS — I722 Aneurysm of renal artery: Secondary | ICD-10-CM | POA: Diagnosis not present

## 2023-12-18 DIAGNOSIS — M542 Cervicalgia: Secondary | ICD-10-CM | POA: Diagnosis not present

## 2023-12-23 DIAGNOSIS — Z23 Encounter for immunization: Secondary | ICD-10-CM | POA: Diagnosis not present

## 2023-12-24 DIAGNOSIS — L988 Other specified disorders of the skin and subcutaneous tissue: Secondary | ICD-10-CM | POA: Diagnosis not present

## 2023-12-24 DIAGNOSIS — D485 Neoplasm of uncertain behavior of skin: Secondary | ICD-10-CM | POA: Diagnosis not present

## 2023-12-24 DIAGNOSIS — Z85828 Personal history of other malignant neoplasm of skin: Secondary | ICD-10-CM | POA: Diagnosis not present

## 2023-12-25 DIAGNOSIS — M25661 Stiffness of right knee, not elsewhere classified: Secondary | ICD-10-CM | POA: Diagnosis not present

## 2023-12-25 DIAGNOSIS — Z96651 Presence of right artificial knee joint: Secondary | ICD-10-CM | POA: Diagnosis not present

## 2023-12-25 IMAGING — CT CT NECK W/ CM
3 of 10 series · 9 of 33 positions shown, 10 images · IV contrast (APPLIED)
Comparison: CT neck 05/30/2020

CLINICAL DATA: History of thyroid cancer and lymphoma. Left neck
lump.

EXAM:
CT NECK WITH CONTRAST
TECHNIQUE: Multidetector CT imaging of the neck was performed using the
standard protocol following the bolus administration of intravenous
contrast.

[Series 5: cor neck · coronal · 0.49mm/px · 1 of 153 slices shown]
[im 77/153  bone]
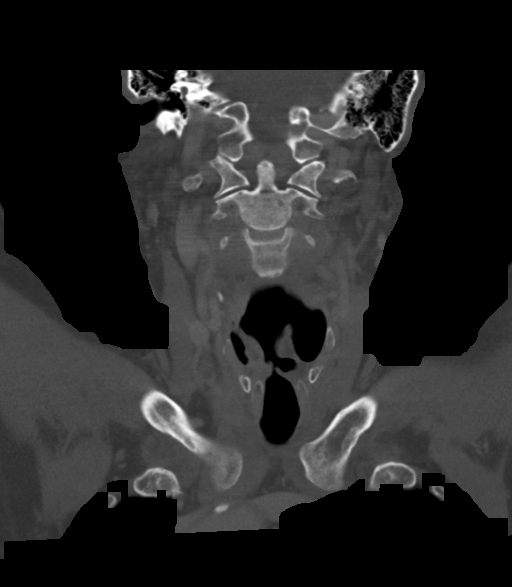

[Series 6: cap with · axial · 0.86mm/px · z∈[-9,+656]mm · 3 of 134 slices shown, 4 images]
[im 1/134  soft-tissue]
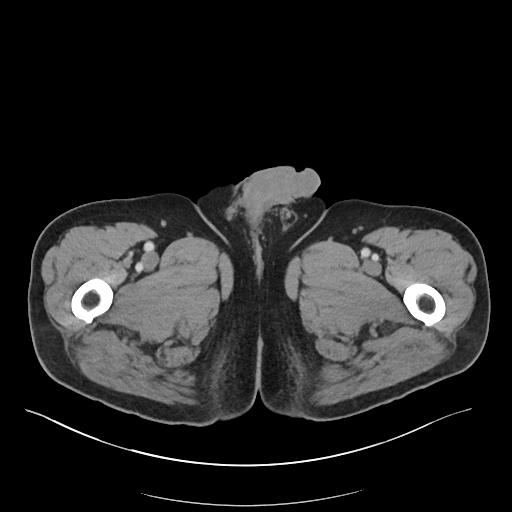
[im 1/134  bone]
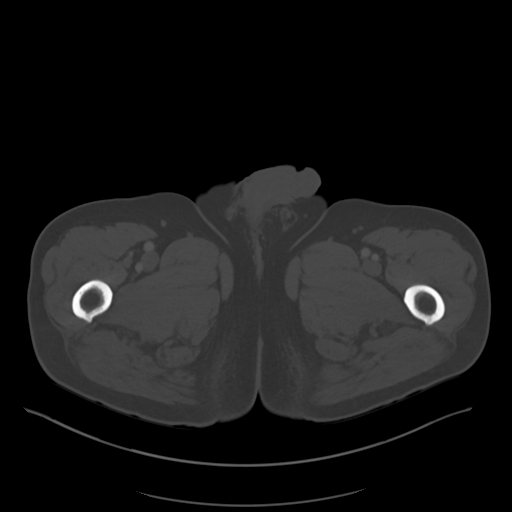
[im 67/134  bone]
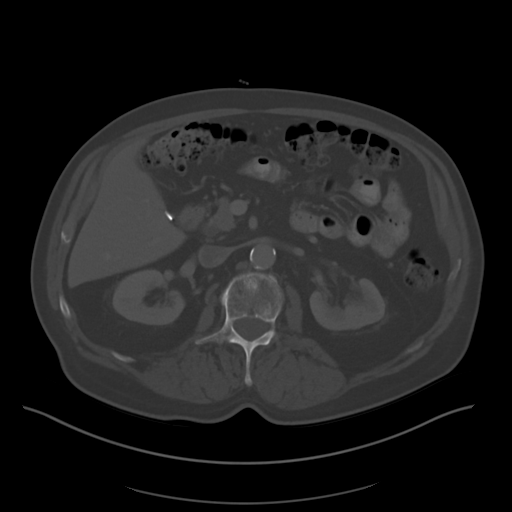
[im 134/134  bone]
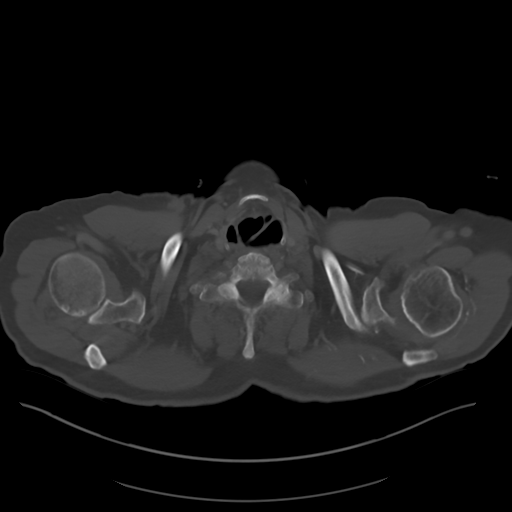

[Series 10: sagittal · sagittal · 0.65mm/px · 5 of 151 slices shown]
[im 22/151  bone]
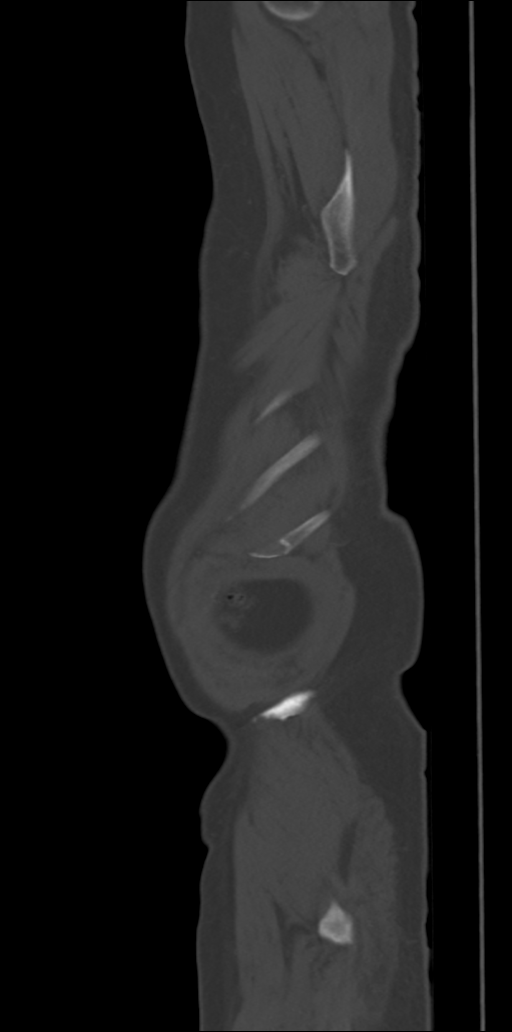
[im 43/151  bone]
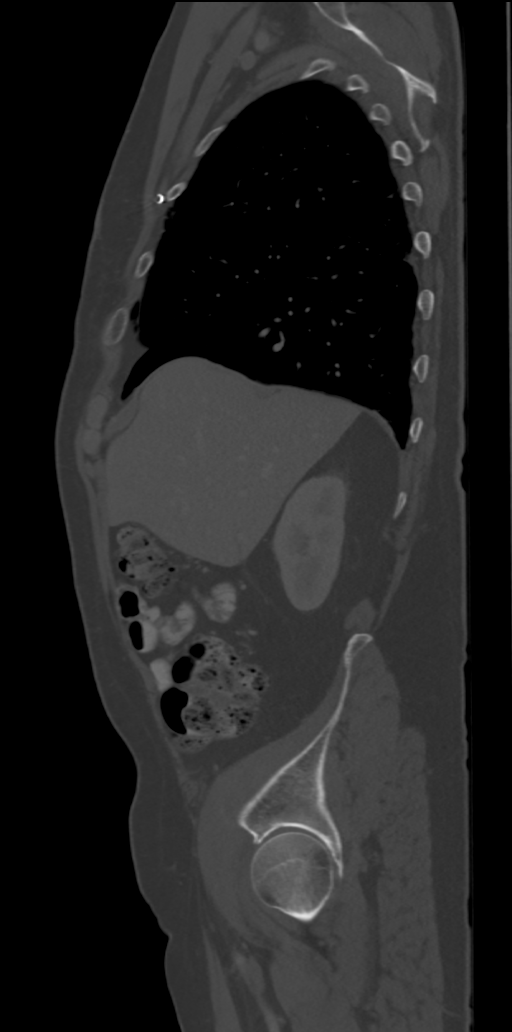
[im 65/151  bone]
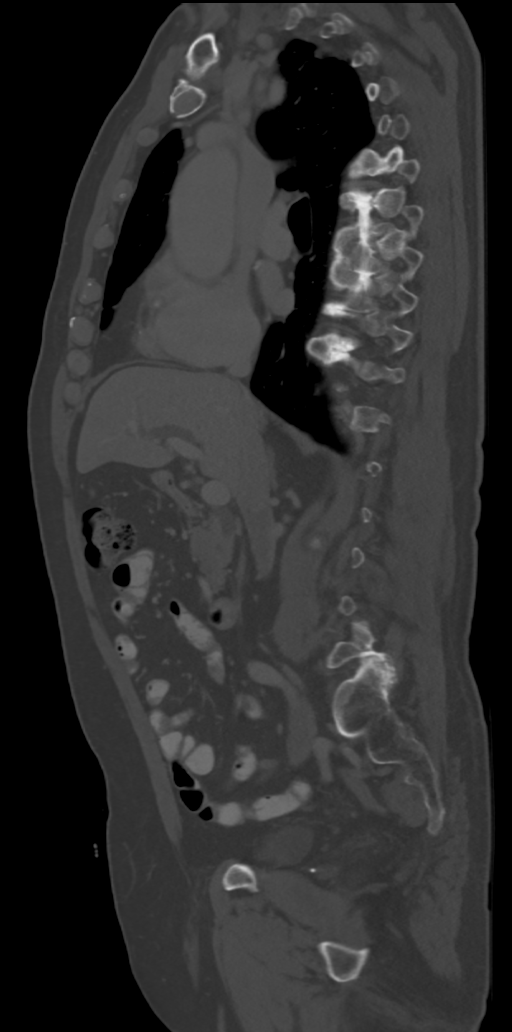
[im 86/151  bone]
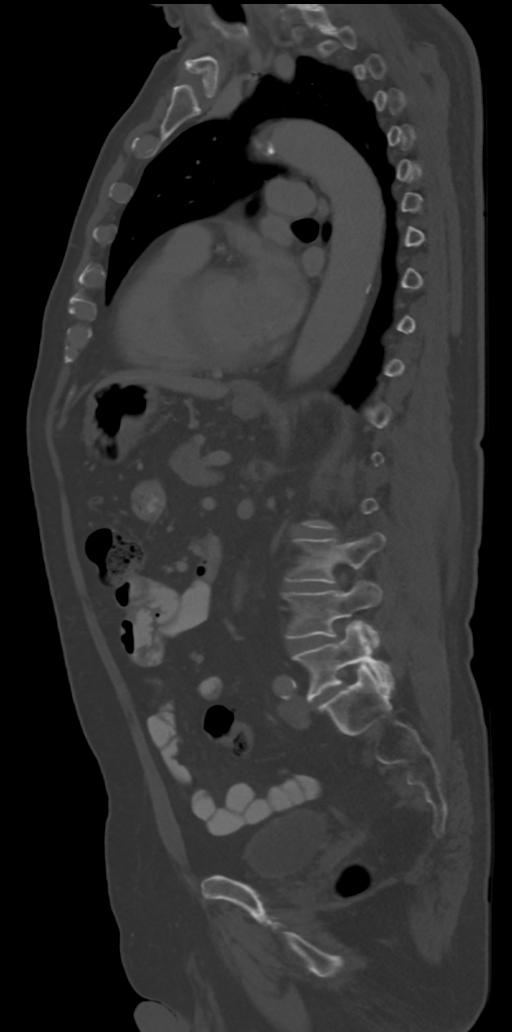
[im 108/151  bone]
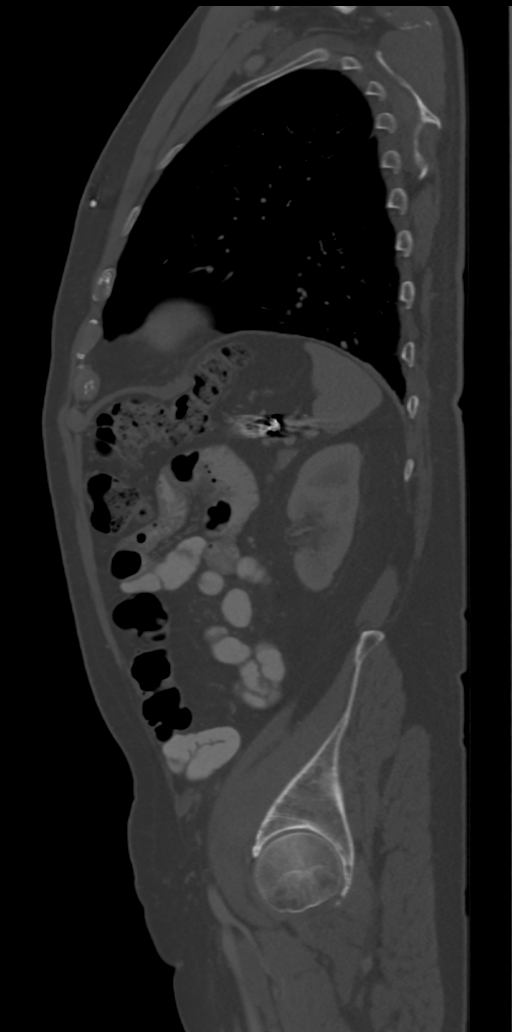

[9 of 33 positions shown; findings below may reference images not displayed]

RADIATION DOSE REDUCTION: This exam was performed according to the
departmental dose-optimization program which includes automated
exposure control, adjustment of the mA and/or kV according to
patient size and/or use of iterative reconstruction technique.

CONTRAST:  100mL OMNIPAQUE IOHEXOL 300 MG/ML  SOLN
FINDINGS: Pharynx and larynx: Negative for pharyngeal mass. Asymmetric
enlargement of the left piriform sinus unchanged from the prior
study. Enlargement of the laryngeal ventricle on the left. Chronic
thinning of the left vocal cord. No change from the prior study.

Salivary glands: No inflammation, mass, or stone.

Thyroid: Postop thyroidectomy.  No recurrent thyroid mass.

Lymph nodes: No recurrent lymphadenopathy. Prior neck surgery on the
left. There is stranding around the left sternocleidomastoid muscle
extending into the platysmas muscle. No change from prior study.
Findings compatible with scarring and treated adenopathy.

Vascular: Left jugular vein appears occluded and may have been
resected. Right jugular vein patent.

Limited intracranial: Negative

Visualized orbits: No orbital mass.  Bilateral cataract extraction

Mastoids and visualized paranasal sinuses: Paranasal sinuses clear.
Mastoid clear.

Skeleton: Mild cervical spondylosis.  No acute skeletal abnormality.

Upper chest: CT chest reported separately

Other: Streak artifact throughout the study. The arms were L2 above
the head for the chest CT.
IMPRESSION: No change from the prior study.  No recurrent mass or adenopathy

Postop changes left neck, stable

Chronic paresis left vocal cord unchanged.

## 2023-12-30 ENCOUNTER — Encounter (HOSPITAL_COMMUNITY): Payer: Self-pay

## 2023-12-30 ENCOUNTER — Other Ambulatory Visit (HOSPITAL_COMMUNITY): Payer: Medicare Other

## 2024-01-02 DIAGNOSIS — I723 Aneurysm of iliac artery: Secondary | ICD-10-CM | POA: Diagnosis not present

## 2024-01-02 DIAGNOSIS — I7121 Aneurysm of the ascending aorta, without rupture: Secondary | ICD-10-CM | POA: Diagnosis not present

## 2024-01-02 DIAGNOSIS — I77810 Thoracic aortic ectasia: Secondary | ICD-10-CM | POA: Diagnosis not present

## 2024-01-02 DIAGNOSIS — I728 Aneurysm of other specified arteries: Secondary | ICD-10-CM | POA: Diagnosis not present

## 2024-01-12 ENCOUNTER — Ambulatory Visit: Admitting: Neurology

## 2024-01-12 ENCOUNTER — Encounter: Payer: Self-pay | Admitting: Neurology

## 2024-01-12 VITALS — BP 119/69 | HR 64 | Ht 73.0 in | Wt 215.0 lb

## 2024-01-12 DIAGNOSIS — C73 Malignant neoplasm of thyroid gland: Secondary | ICD-10-CM | POA: Diagnosis not present

## 2024-01-12 DIAGNOSIS — G608 Other hereditary and idiopathic neuropathies: Secondary | ICD-10-CM | POA: Diagnosis not present

## 2024-01-12 DIAGNOSIS — C8311 Mantle cell lymphoma, lymph nodes of head, face, and neck: Secondary | ICD-10-CM | POA: Diagnosis not present

## 2024-01-12 DIAGNOSIS — G620 Drug-induced polyneuropathy: Secondary | ICD-10-CM | POA: Diagnosis not present

## 2024-01-12 NOTE — Progress Notes (Signed)
 Provider:  Dedra Gores, MD  Primary Care Physician:  Charlott Norleen Neighbor, MD [address] [place] [address]     Referring Provider: Charlott Norleen Neighbor, Md [address] [place],  [place] [address]          Chief Complaint according to patient   Patient presents with:                HISTORY OF PRESENT ILLNESS:  Sean Stanley is a 83 y.o. male patient who is here for revisit 01/12/2024 for Memory and neuropathy, agent orange induced.   Mr Kozub received a new right total knee joint  on 06-16-2023.  He reports still difficulties with movement, no pain,  Mantle cell Lymphoma .     Chief concern according to patient :         07/09/2023    1:31 PM 05/22/2022   10:31 AM 05/22/2021   10:52 AM 11/15/2020   10:27 AM 04/30/2018   10:30 AM  Montreal Cognitive Assessment   Visuospatial/ Executive (0/5) 5 4 5 4 5   Naming (0/3) 3 3 3 3 3   Attention: Read list of digits (0/2) 2 2 2 2 2   Attention: Read list of letters (0/1) 1 1 1 1 1   Attention: Serial 7 subtraction starting at 100 (0/3) 3 3 3 3 3   Language: Repeat phrase (0/2) 2 2 2 2 2   Language : Fluency (0/1) 01 0 1 0 1  Abstraction (0/2) 2 2 2 2 2   Delayed Recall (0/5) 5 4 3 3  0  Orientation (0/6) 6 6 6 6 6   Total 30 27 28 26 25         Sean Stanley is a 83 y.o. male patient who is here for revisit 07/09/2023 for  memory concerns, he reports he has trouble with the trivia quiz at [facility]. He had no trouble writing his memories but he often  can't remember what he ate for breakfast, he struggles for words.   Word finding - charades.  Chief concern according to patient :  I am sleeping less than ever. Pain is contributing. He goes to void every time he wakes up.  Sleeping poorly a may affect his memory as well. He takes tylenol  and robaxin  and tramadol .  He currently walks with a walker, March 21 st was his surgery, TKR on the right, he is understandably in pain.     He is concerned as his 50 year  younger brother is now suffering from Alzheimer disease and already needed  moving to a care home.   Mother had PD and depression treated with ECT.  PS ) brother with FTD at age 54, in memory care. Mother had parkinson's disease.   He has seen ENT for the vocal cord paralysis.       Review of Systems: Out of a complete 14 system review, the patient complains of only the following symptoms, and all other reviewed systems are negative.:   SLEEPINESS ?  How likely are you to doze in the following situations: 0 = not likely, 1 = slight chance, 2 = moderate chance, 3 = high chance  Sitting and Reading? Watching Television? Sitting inactive in a public place (theater or meeting)? Lying down in the afternoon when circumstances permit? Sitting and talking to someone? Sitting quietly after lunch without alcohol ? In a car, while stopped for a few minutes in traffic? As a passenger in a car  for an hour without a break?  Total =  4/ 24.  Nocturia :   Neuropathy; improved pain.        Social History   Socioeconomic History   Marital status: Married    Spouse name: Inocente   Number of children: 2   Years of education: Not on file   Highest education level: Not on file  Occupational History   Occupation: retired Engineer, drilling of [employer]  Tobacco Use   Smoking status: Never   Smokeless tobacco: Never   Tobacco comments:    Never smoke 04/10/22  Vaping Use   Vaping status: Never Used  Substance and Sexual Activity   Alcohol  use: Not Currently   Drug use: No   Sexual activity: Not on file  Other Topics Concern   Not on file  Social History Narrative   Lives in [place], Retired   Social Drivers of Corporate investment banker Strain: Not on file  Food Insecurity: No Food Insecurity (06/16/2023)   Hunger Vital Sign    Worried About Running Out of Food in the Last Year: Never true    Ran Out of Food in the Last Year: Never true  Transportation  Needs: No Transportation Needs (06/16/2023)   PRAPARE - Administrator, Civil Service (Medical): No    Lack of Transportation (Non-Medical): No  Physical Activity: Not on file  Stress: Not on file  Social Connections: Patient Declined (06/16/2023)   Social Connection and Isolation Panel    Frequency of Communication with Friends and Family: Patient declined    Frequency of Social Gatherings with Friends and Family: Patient declined    Attends Religious Services: Patient declined    Database administrator or Organizations: Patient declined    Attends Engineer, structural: Patient declined    Marital Status: Patient declined    Family History  Problem Relation Age of Onset   Parkinson's disease Mother 31   Heart failure Father 19   Cancer Maternal Grandmother    Heart attack Maternal Grandfather    Heart attack Paternal Grandmother    Heart attack Paternal Grandfather     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    being [facility] at [facility]   Aneurysm of splenic artery 2014   s/p coiling 12/16/12 - [facility]   Atrial flutter (HCC)    BPH (benign prostatic hyperplasia)    Cancer (HCC)    melanoma on lower right back and left chest - surgically removed and cleared   Difficult intubation    Dysrhythmia    H/O agent Orange exposure    Headache    migraine- not current   High bilirubin    pt states it's genetic   History of blood transfusion    Hypercholesteremia    Hypercholesterolemia    Lymphoma (HCC) 04/2018   Mitral valve disease    annuloplasty 2002 [facility]   Neuropathy    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 Tammy told him he had improved  Paroxysmal atrial fibrillation (HCC) 01/02/2021   Pneumonia    Thoracic ascending aortic aneurysm    4.5 cm 03/2018 CT. 4.4 cm on echo 10/2021 and on CTA 03/2021   Thyroid  cancer [facility])     Past Surgical History:  Procedure Laterality Date   ABDOMINAL ANGIOGRAM  08/05/2012   aneurysm repair     ATRIAL FIBRILLATION ABLATION N/A 10/08/2022   Procedure: ATRIAL FIBRILLATION ABLATION;  Surgeon: Inocencio Soyla Lunger, MD;  Location: [facility] INVASIVE CV LAB;  Service: Cardiovascular;  Laterality: N/A;   CARDIOVERSION  02/12/2012   Procedure: CARDIOVERSION;  Surgeon: Vinie KYM Maxcy, MD;  Location: [facility] ENDOSCOPY;  Service: Cardiovascular;  Laterality: N/A;   CARDIOVERSION N/A 06/22/2013   Procedure: CARDIOVERSION;  Surgeon: Leim VEAR Moose, MD;  Location: [facility] ENDOSCOPY;  Service: Cardiovascular;  Laterality: N/A;   CARDIOVERSION N/A 06/19/2021   Procedure: CARDIOVERSION;  Surgeon: Santo Stanly LABOR, MD;  Location: [facility] ENDOSCOPY;  Service: Cardiovascular;  Laterality: N/A;   CATARACT EXTRACTION Bilateral 2018   with lens implant   CHOLECYSTECTOMY     COLONOSCOPY     ESOPHAGOGASTRODUODENOSCOPY     implantable loop recorder placement  03/29/2021   Medtronic Reveal Linq model LNQ 22 [id number]) implantable loop recorder   IR IMAGING GUIDED PORT INSERTION  05/26/2018   IR REMOVAL TUN ACCESS W/ PORT W/O FL MOD SED  02/05/2021   LYMPH NODE BIOPSY Left 04/27/2018   Procedure: EXCISIONAL BIOPSY OF LEFT CERVICAL LYMPH NODE;  Surgeon: Carlie Clark, MD;  Location: [facility] OR;  Service: ENT;  Laterality: Left;   LYMPH NODE BIOPSY Left 11/23/2018   Procedure: LEFT CERVICAL LYMPH NODE OPEN BIOPSY;  Surgeon: Carlie Clark, MD;  Location[facility] OR;  Service: ENT;  Laterality: Left;   MENISCUS REPAIR Right 2009   MITRAL VALVE REPAIR  2002   [facility]   NM MYOVIEW  LTD  07/22/2006   no ischemia   RADICAL NECK  DISSECTION Left 01/15/2019   Procedure: LEFT NECK DISSECTION;  Surgeon: Carlie Clark, MD;  Location: [facility] OR;  Service: ENT;  Laterality: Left;   RIGHT HEART CATH  06/19/2004   normal right heart dynamics. EF 50%   TEE WITHOUT CARDIOVERSION  02/12/2012   Procedure: TRANSESOPHAGEAL ECHOCARDIOGRAM (TEE);  Surgeon: Vinie KYM Maxcy, MD;  Location: [facility] ENDOSCOPY;  Service: Cardiovascular;  Laterality: N/A;   TEE WITHOUT CARDIOVERSION N/A 06/22/2013   Procedure: TRANSESOPHAGEAL ECHOCARDIOGRAM (TEE);  Surgeon: Leim VEAR Moose, MD;  Location: [facility] ENDOSCOPY;  Service: Cardiovascular;  Laterality: N/A;   THYROIDECTOMY N/A 01/15/2019   Procedure: TOTAL THYROIDECTOMY;  Surgeon: Carlie Clark, MD;  Location: [facility] OR;  Service: ENT;  Laterality: N/A;   TOTAL KNEE ARTHROPLASTY Right 06/16/2023   Procedure: ARTHROPLASTY, KNEE, TOTAL;  Surgeon: Melodi Lerner, MD;  Location: [facility] ORS;  Service: Orthopedics;  Laterality: Right;     Current Outpatient Medications on File Prior to Visit  Medication Sig Dispense Refill   acetaminophen  (TYLENOL ) 500 MG tablet Take 1,000 mg by mouth.     alfuzosin  (UROXATRAL ) 10 MG 24 hr tablet Take 10 mg by mouth every evening.     apixaban  (ELIQUIS ) 5 MG TABS tablet Take 1 tablet (5 mg total) by mouth 2 (two) times daily. 60 tablet 3   buPROPion  (WELLBUTRIN  XL) 150 MG 24 hr tablet Take 1 tablet (150 mg total) by mouth daily. 90 tablet 3   Cholecalciferol  (VITAMIN D ) 2000 units tablet Take 2,000 Units by mouth at bedtime.      ezetimibe  (ZETIA ) 10 MG tablet Take 1  tablet (10 mg total) by mouth every morning. 90 tablet 2   finasteride  (PROSCAR ) 5 MG tablet Take 5 mg by mouth every evening.     levothyroxine  (SYNTHROID ) 150 MCG tablet Take 150 mcg by mouth daily before breakfast. 1 hour before breakfast     methocarbamol  (ROBAXIN ) 750 MG tablet Take 750 mg by mouth 3 (three) times daily as needed.     metoprolol  succinate (TOPROL -XL) 25 MG 24 hr tablet Take 12.5 mg by mouth in the  morning.     Polyethyl Glycol-Propyl Glycol (SYSTANE HYDRATION PF OP) Place 1 drop into both eyes 3 (three) times daily as needed (Dry eyes).     rosuvastatin  (CRESTOR ) 5 MG tablet Take 1 tablet by mouth three times a week 36 tablet 3   No current facility-administered medications on file prior to visit.    Allergies  Allergen Reactions   Rosuvastatin  Other (See Comments)    Muscle/chest pain/Myalgias Currently taking M-W-F in the evening   Aspirin Other (See Comments)    Has been instructed not to take any blood thinners even aspirin due to history of several aneurysms     Levaquin [Levofloxacin In D5w]     tendonitis   Levofloxacin Other (See Comments)    Itching, tendonitis   Pravastatin  Other (See Comments)    Muscle aches   Simvastatin Other (See Comments)    Chest pain  Other Reaction(s): myalgias   Allopurinol  Rash   Ampicillin Rash    Did it involve swelling of the face/tongue/throat, SOB, or low BP? No  Did it involve sudden or severe rash/hives, skin peeling, or any reaction on the inside of your mouth or nose? Yes  Did you need to seek medical attention at a hospital or doctor's office? Yes  When did it last happen?      50+ years  If all above answers are "NO", may proceed with cephalosporin use.  Other Reaction(s): Not available   Nickel Itching and Rash   Penicillin G Rash    Tolerated Cephalosporin Date: 06/17/23.     Rosuvastatin  Calcium  Other (See Comments)    Muscle aches, myalgia, chest wall pain     DIAGNOSTIC DATA (LABS, IMAGING, TESTING) - I reviewed patient records, labs, notes, testing and imaging myself where available.  Lab Results  Component Value Date   WBC 4.9 10/16/2023   HGB 13.2 10/16/2023   HCT 41.4 10/16/2023   MCV 88.8 10/16/2023   PLT 172 10/16/2023      Component Value Date/Time   NA 138 10/16/2023 0956   NA 140 09/19/2022 0746   K 4.7 10/16/2023 0956   CL 104 10/16/2023 0956   CO2 30 10/16/2023 0956   GLUCOSE 88  10/16/2023 0956   BUN 26 (H) 10/16/2023 0956   BUN 34 (H) 09/19/2022 0746   CREATININE 1.21 10/16/2023 0956   CREATININE 1.43 (H) 04/11/2022 1000   CALCIUM  9.1 10/16/2023 0956   PROT 6.2 12/11/2023 0820   ALBUMIN 4.3 12/11/2023 0820   AST 23 12/11/2023 0820   AST 20 04/11/2022 1000   ALT 18 12/11/2023 0820   ALT 28 04/11/2022 1000   ALKPHOS 81 12/11/2023 0820   BILITOT 1.5 (H) 12/11/2023 0820   BILITOT 1.3 (H) 04/11/2022 1000   GFRNONAA 60 (L) 10/16/2023 0956   GFRNONAA 50 (L) 04/11/2022 1000   GFRAA >60 11/22/2019 1023   Lab Results  Component Value Date   CHOL 152 12/11/2023   HDL 66 12/11/2023   LDLCALC 73  12/11/2023   TRIG 67 12/11/2023   CHOLHDL 2.3 12/11/2023   No results found for: HGBA1C Lab Results  Component Value Date   VITAMINB12 355 05/24/2019   Lab Results  Component Value Date   TSH 0.673 03/23/2022    PHYSICAL EXAM:  Vitals:   01/12/24 1326  BP: 119/69  Pulse: 64   No data found. Body mass index is 28.37 kg/m.   Wt Readings from Last 3 Encounters:  01/12/24 215 lb (97.5 kg)  12/11/23 211 lb 3.2 oz (95.8 kg)  10/16/23 212 lb 6.4 oz (96.3 kg)     Ht Readings from Last 3 Encounters:  01/12/24 6' 1 (1.854 m)  12/11/23 6' 1 (1.854 m)  10/16/23 6' 1 (1.854 m)      General: The patient is awake, alert and appears not in acute distress and groomed. Head: Normocephalic, atraumatic.  Neck is supple. The patient is awake, alert and appears not in acute distress. The patient is well groomed. Head: Normocephalic, atraumatic.  Neck is supple. Cardiovascular: regular rate and cardiac rhythm by pulse,  ablation last summer 24   without distended neck veins. Respiratory: Lungs are clear to auscultation.  Skin:  Without evidence of ankle edema, or rash. Trunk: The patient's posture is erect.   NEUROLOGIC EXAM: The patient is awake and alert, oriented to place and time.   Memory subjective described as intact.  Attention span & concentration  ability appears normal.  Speech is fluent,  with severe dysarthria,  with dysphonia .  Mood and affect are appropriate.   Cranial nerves: no loss of smell or taste reported  Pupils are equal and briskly reactive to light.  Funduscopic exam; deferred   Extraocular movements in vertical and horizontal planes were intact and without nystagmus. No Diplopia. Visual fields by finger perimetry are intact. Hearing was intact to soft voice and finger rubbing.    Facial sensation intact to fine touch.  Facial motor strength is symmetric and tongue and uvula move midline.  Neck ROM : rotation, tilt and flexion extension were normal for age and shoulder shrug was symmetrical.    Motor exam:  knee surgery limited the exam.     Neuropathy- present.   ASSESSMENT AND PLAN :   82 y.o. year old male  here with:    1) subjective memory concerns , MOCA 30/ 30 - no ATN  needed.   2)  neuropathy has improved, is less painful. Now takes tylenol  once or twice a week.  Wellbutrin  is taken for thi condition.   3)  insomnia improved, 7 hours of sleep, sees a counselor at the [facility]   4) lymphoma affected the vocal cord, ST seem ENT seen .  No Dysphagia.    5) A fib, loop recorder is now out of battery power.        I would like to thank Charlott Norleen Neighbor, MD and Charlott Norleen Neighbor, Md [address] [place],  [place] [address] for allowing me to meet with this pleasant patient.   Sleep Clinic Patients are generally offered input on sleep hygiene, life style changes and how to improve compliance with medical treatment where applicable. Review and reiteration of good sleep hygiene measures is offered to any sleep clinic patient, be it in the first consultation or with any follow up visits.    Any patient with sleepiness should be cautioned not to drive, work at heights, or operate dangerous or heavy equipment when feeling tired or sleepy.  The patient will be seen in follow-up in the sleep  clinic at [facility] for discussion of test results, sleep related symptoms and treatment compliance review, further management strategies, etc.   The referring provider will be notified of the test results.   The patient's condition requires frequent monitoring and adjustments in the treatment plan, reflecting the ongoing complexity of care.  This provider is the continuing focal point for all needed services for this condition.  After spending a total time of  36  minutes face to face and time for  history taking, physical and neurologic examination, review of laboratory studies,  personal review of imaging studies, reports and results of other testing and review of referral information / records as far as provided in visit,   Electronically signed by: Dedra Gores, MD 01/12/2024 1:53 PM  [facility] Board certified by The [employer] of Sleep Medicine and Diplomate of the [employer] of Sleep Medicine. Board certified In Neurology through the ABPN, Fellow of the [employer] of Neu[employer].

## 2024-01-12 NOTE — Patient Instructions (Signed)
 Rv in 12 months with MOCA.

## 2024-01-13 ENCOUNTER — Other Ambulatory Visit (HOSPITAL_BASED_OUTPATIENT_CLINIC_OR_DEPARTMENT_OTHER): Payer: Self-pay

## 2024-01-13 DIAGNOSIS — Z23 Encounter for immunization: Secondary | ICD-10-CM | POA: Diagnosis not present

## 2024-01-13 MED ORDER — COMIRNATY 30 MCG/0.3ML IM SUSY
0.3000 mL | PREFILLED_SYRINGE | Freq: Once | INTRAMUSCULAR | 0 refills | Status: AC
  Filled 2024-01-13: qty 0.3, 1d supply, fill #0

## 2024-01-28 DIAGNOSIS — I722 Aneurysm of renal artery: Secondary | ICD-10-CM | POA: Diagnosis not present

## 2024-01-28 DIAGNOSIS — I7121 Aneurysm of the ascending aorta, without rupture: Secondary | ICD-10-CM | POA: Diagnosis not present

## 2024-01-28 DIAGNOSIS — I728 Aneurysm of other specified arteries: Secondary | ICD-10-CM | POA: Diagnosis not present

## 2024-02-05 DIAGNOSIS — E559 Vitamin D deficiency, unspecified: Secondary | ICD-10-CM | POA: Diagnosis not present

## 2024-02-05 DIAGNOSIS — M542 Cervicalgia: Secondary | ICD-10-CM | POA: Diagnosis not present

## 2024-02-05 DIAGNOSIS — E7800 Pure hypercholesterolemia, unspecified: Secondary | ICD-10-CM | POA: Diagnosis not present

## 2024-02-05 DIAGNOSIS — C73 Malignant neoplasm of thyroid gland: Secondary | ICD-10-CM | POA: Diagnosis not present

## 2024-02-05 DIAGNOSIS — E890 Postprocedural hypothyroidism: Secondary | ICD-10-CM | POA: Diagnosis not present

## 2024-02-16 DIAGNOSIS — M47812 Spondylosis without myelopathy or radiculopathy, cervical region: Secondary | ICD-10-CM | POA: Diagnosis not present

## 2024-02-25 DIAGNOSIS — M47812 Spondylosis without myelopathy or radiculopathy, cervical region: Secondary | ICD-10-CM | POA: Diagnosis not present

## 2024-03-01 DIAGNOSIS — M47812 Spondylosis without myelopathy or radiculopathy, cervical region: Secondary | ICD-10-CM | POA: Diagnosis not present

## 2024-03-04 DIAGNOSIS — M47812 Spondylosis without myelopathy or radiculopathy, cervical region: Secondary | ICD-10-CM | POA: Diagnosis not present

## 2024-03-08 DIAGNOSIS — M47812 Spondylosis without myelopathy or radiculopathy, cervical region: Secondary | ICD-10-CM | POA: Diagnosis not present

## 2024-03-19 ENCOUNTER — Ambulatory Visit (HOSPITAL_COMMUNITY)
Admission: RE | Admit: 2024-03-19 | Discharge: 2024-03-19 | Disposition: A | Discharge status: Home / Self Care | Source: Ambulatory Visit | Attending: Internal Medicine | Admitting: Internal Medicine

## 2024-03-19 VITALS — BP 118/62 | HR 78 | Ht 73.0 in | Wt 222.2 lb

## 2024-03-19 DIAGNOSIS — I4891 Unspecified atrial fibrillation: Secondary | ICD-10-CM | POA: Insufficient documentation

## 2024-03-19 DIAGNOSIS — D6869 Other thrombophilia: Secondary | ICD-10-CM | POA: Diagnosis present

## 2024-03-19 DIAGNOSIS — I4819 Other persistent atrial fibrillation: Secondary | ICD-10-CM | POA: Insufficient documentation

## 2024-03-19 MED ORDER — METOPROLOL SUCCINATE ER 25 MG PO TB24: 12.5000 mg | ORAL_TABLET | Freq: Two times a day (BID) | ORAL | 1 refills | Status: DC

## 2024-03-19 NOTE — Patient Instructions (Signed)
 Increase metoprolol  succinate (TOPROL -XL) 12.5 mg ( 1/2 tab) to twice a day   Wear monitor for 7 days remove 03/26/24 send in mail

## 2024-03-19 NOTE — Progress Notes (Signed)
 "  Primary Care Physician: Charlott Norleen Neighbor, MD Referring Physician: ER f/u  Prior Cardiologist: Dr. Clarinda ([facility]), recently transferred over to Dr. Hobart Primary EP: Dr Deno Arleen DELENA Leartis is a 83 y.o. male with a h/o  MV repair 2002,  multi abdominal aneurysms with prior bleeding, (followed at [facility]),  unable to take anticoagulation previously, h/o atrial flutter, that is in the afib clinic, for onset of palpitations 8/9. He was found to be in rapid atrial fib at 143 bpm. He could not be cardioverted as he is not on anticoagulation 2/2 to multiple abdominal  aneurysms. He converted as IV was being started.  He was watched for another 3 hours and then d/c home with f/u here.  He is on metoprolol  daily. H/o ablation in 2015 at [facility]. S/p Afib ablation on 10/08/22 by Dr. Inocencio. No episodes of Afib since ablation. He does note that he has terrible dreams since starting the amiodarone .   Follow up 03/19/24. Patient is currently in NSR. He has noted palpitations recently and feeling though at times he cannot take a deep breath. No clear triggers.  He does not drink alcohol  or caffeine.  He is taking Toprol  12.5 mg daily. No missed doses of Eliquis  5 mg BID.   Today, he denies symptoms of orthopnea, PND, lower extremity edema, dizziness, presyncope, syncope, snoring, daytime somnolence, bleeding, or neurologic sequela. The patient is tolerating medications without difficulties and is otherwise without complaint today.   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 - [facility]   Atrial  flutter (HCC)    BPH (benign prostatic hyperplasia)    Cancer (HCC)    melanoma on lower right back and left chest - surgically removed and cleared   Difficult intubation    Dysrhythmia    H/O agent Orange exposure    Headache    migraine- not current   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 Tammy told him he had improved   Paroxysmal atrial fibrillation (HCC) 01/02/2021   Pneumonia    Thoracic ascending aortic aneurysm    4.5 cm 03/2018 CT. 4.4 cm on echo 10/2021 and on CTA 03/2021   Thyroid  cancer [facility])     Current Outpatient Medications  Medication Sig Dispense Refill   acetaminophen  (TYLENOL ) 500 MG tablet Take 1,000 mg by mouth.     alfuzosin  (UROXATRAL ) 10 MG 24 hr tablet Take 10 mg by mouth every evening.     apixaban  (ELIQUIS ) 5 MG TABS tablet Take 1 tablet (5 mg total) by mouth 2 (two) times daily. 60 tablet 3   buPROPion  (WELLBUTRIN  XL) 150 MG 24 hr tablet Take 1 tablet (150  mg total) by mouth daily. 90 tablet 3   Cholecalciferol  (VITAMIN D ) 2000 units tablet Take 2,000 Units by mouth at bedtime.      ezetimibe  (ZETIA ) 10 MG tablet Take 1 tablet (10 mg total) by mouth every morning. 90 tablet 2   finasteride  (PROSCAR ) 5 MG tablet Take 5 mg by mouth every evening.     levothyroxine  (SYNTHROID ) 150 MCG tablet Take 150 mcg by mouth daily before breakfast. 1 hour before breakfast     Polyethyl Glycol-Propyl Glycol (SYSTANE HYDRATION PF OP) Place 1 drop into both eyes 3 (three) times daily as needed (Dry eyes).     rosuvastatin  (CRESTOR ) 5 MG tablet Take 1 tablet by mouth three times a week 36 tablet 3   methocarbamol  (ROBAXIN ) 750 MG tablet Take 750 mg by mouth 3 (three) times  daily as needed. (Patient not taking: Reported on 03/19/2024)     metoprolol  succinate (TOPROL -XL) 25 MG 24 hr tablet Take 0.5 tablets (12.5 mg total) by mouth in the morning and at bedtime. 30 tablet 1   No current facility-administered medications for this encounter.    Allergies  Allergen Reactions   Rosuvastatin  Other (See Comments)    Muscle/chest pain/Myalgias Currently taking M-W-F in the evening   Aspirin Other (See Comments)    Has been instructed not to take any blood thinners even aspirin due to history of several aneurysms     Levaquin [Levofloxacin In D5w]     tendonitis   Levofloxacin Other (See Comments)    Itching, tendonitis   Pravastatin  Other (See Comments)    Muscle aches   Simvastatin Other (See Comments)    Chest pain  Other Reaction(s): myalgias   Allopurinol  Rash   Ampicillin Rash    Did it involve swelling of the face/tongue/throat, SOB, or low BP? No  Did it involve sudden or severe rash/hives, skin peeling, or any reaction on the inside of your mouth or nose? Yes  Did you need to seek medical attention at a hospital or doctor's office? Yes  When did it last happen?      50+ years  If all above answers are NO, may proceed with cephalosporin use.  Other Reaction(s): Not available   Nickel Itching and Rash   Penicillin G Rash    Tolerated Cephalosporin Date: 06/17/23.     Rosuvastatin  Calcium  Other (See Comments)    Muscle aches, myalgia, chest wall pain   ROS- All systems are reviewed and negative except as per the HPI above  Physical Exam: Vitals:   03/19/24 1127  BP: 118/62  Pulse: 78  Weight: 100.8 kg  Height: 6' 1 (1.854 m)     Wt Readings from Last 3 Encounters:  03/19/24 100.8 kg  01/12/24 97.5 kg  12/11/23 95.8 kg    Labs: Lab Results  Component Value Date   NA 138 10/16/2023   K 4.7 10/16/2023   CL 104 10/16/2023   CO2 30 10/16/2023   GLUCOSE 88 10/16/2023   BUN 26 (H) 10/16/2023   CREATININE 1.21 10/16/2023    CALCIUM  9.1 10/16/2023   PHOS 3.4 08/11/2012   MG 2.2 04/05/2022   Lab Results  Component Value Date   INR 1.1 04/10/2022   Lab Results  Component Value Date   CHOL 152 12/11/2023   HDL 66 12/11/2023   LDLCALC 73 12/11/2023   TRIG 67 12/11/2023   GEN- The patient is well appearing, alert and oriented x 3 today.   Neck - no JVD  or carotid bruit noted Lungs- Clear to ausculation bilaterally, normal work of breathing Heart- Regular rate and rhythm with occasional ectopy noted, no murmurs, rubs or gallops, PMI not laterally displaced Extremities- no clubbing, cyanosis, or edema Skin - no rash or ecchymosis noted   EKG-  EKG Interpretation Date/Time:  Friday March 19 2024 11:52:36 EST Ventricular Rate:  78 PR Interval:  184 QRS Duration:  102 QT Interval:  418 QTC Calculation: 476 R Axis:   2  Text Interpretation: Sinus rhythm with Premature atrial complexes with Abberant conduction  occasional PVCs Minimal voltage criteria for LVH, may be normal variant ( R in aVL ) Borderline ECG When compared with ECG of 22-Jul-2023 11:13,  Preivous ECG is present Confirmed by Terra Pac [phone]) on 03/19/2024 11:55:32 AM    Epic records reviewed   Echo 11/08/21  1. Left ventricular ejection fraction, by estimation, is 50 to 55%. The left ventricle has normal function. The left ventricle has no regional wall motion abnormalities. Left ventricular diastolic function could not be evaluated.   2. Right ventricular systolic function is normal. The right ventricular size is normal. There is normal pulmonary artery systolic pressure.   3. Left atrial size was severely dilated.   4. Right atrial size was severely dilated.   5. The mitral valve has been repaired/replaced. No evidence of mitral valve regurgitation. No evidence of mitral stenosis. Procedure Date: 2002.   6. The aortic valve is tricuspid. Aortic valve regurgitation is not  visualized. Aortic valve sclerosis is present, with no  evidence of aortic valve stenosis.   7. There is moderate dilatation of the aortic root, measuring 40 mm. There is moderate dilatation of the ascending aorta, measuring 44 mm.   8. The inferior vena cava is normal in size with greater than 50%  respiratory variability, suggesting right atrial pressure of 3 mmHg.   CHA2DS2-VASc Score = 5  The patient's score is based upon: CHF History: 1 HTN History: 1 Diabetes History: 0 Stroke History: 0 Vascular Disease History: 1 Age Score: 2 Gender Score: 0      ASSESSMENT AND PLAN: 1. Persistent Atrial Fibrillation/atypical atrial flutter The patient's CHA2DS2-VASc score is 5, indicating a 7.2% annual risk of stroke.   Patient s/p DCCV on 04/05/22 and 04/10/22 with quick return of his atrial flutter.  S/p RFA Afib ablation on 10/08/22 by Dr. Inocencio.  Patient is currently in NSR. Patient does not have a clear trigger for recent increase in palpitations.  Due to this and patient being in NSR today, will place a 1 week ZIO to assess for arrhythmia burden.  After discussion, we will try and increase rate control by having patient transition to Toprol  12.5 mg twice daily.  Recommendation to increase fluids consistently to avoid possible hypotension.  I will contact patient with results of monitor.  We did briefly discuss repeat ablation if patient has significant arrhythmia burden on monitor.  He previously noted side effect of bad dreams with amiodarone .  His QTc appears to be prohibitive of Tikosyn.   2. Secondary Hypercoagulable State (ICD10:  D68.69) The patient is at significant risk for stroke/thromboembolism based upon his CHA2DS2-VASc Score of 5.  Continue Apixaban  (Eliquis ).  No bleeding issue is on Eliquis .  Continue Eliquis  5 mg twice daily.  Dosage is correct based on weight greater than 60 kg and creatinine less than 1.5.  3. Valvular heart disease Prior MV repair  4. NICM EF normalized Patient appears euvolemic today.   Follow up  will  be based on monitor results.    Dorn Heinrich, PA-C [facility] [address] [place], [place] [address] [phone]   "

## 2024-03-22 NOTE — Telephone Encounter (Signed)
 Needs prior auth on [email or web address]

## 2024-04-08 ENCOUNTER — Telehealth (HOSPITAL_COMMUNITY): Payer: Self-pay | Admitting: *Deleted

## 2024-04-08 MED ORDER — METOPROLOL SUCCINATE ER 25 MG PO TB24: ORAL_TABLET | ORAL | Status: AC

## 2024-04-08 NOTE — Telephone Encounter (Signed)
 Pt and wife notified of recommendations. Pt states he continues to feel frequent palpitations and they make him feel tired.  BP 120-140/70-80s and HR 65-71.  Discussed with Thom Heinrich PA will increase metoprolol  to 25mg  in the AM and 12.5mg  in the PM and call with update of response in 1-2 weeks. IF tolerating well will need a new rx of higher dose of metoprolol .

## 2024-04-08 NOTE — Telephone Encounter (Signed)
-----   Message from Terra Pac sent at 04/08/2024  9:23 AM EST ----- Hey,  Please let patient know the great news that his monitor did not show any A-fib.  He seemed to hit the trigger button anytime he had a normal rhythm with PACs.  I increased his Toprol  to 12.5 mg twice daily at last office visit, so my hope is that continuing this will help him as long as he tolerates it.  I would not make any other changes at this time and keep his follow-up appointments as scheduled.  Thank you,  Thom

## 2024-05-20 ENCOUNTER — Encounter (INDEPENDENT_AMBULATORY_CARE_PROVIDER_SITE_OTHER): Payer: Medicare Other | Admitting: Ophthalmology

## 2024-06-17 ENCOUNTER — Ambulatory Visit (HOSPITAL_BASED_OUTPATIENT_CLINIC_OR_DEPARTMENT_OTHER): Admitting: Cardiovascular Disease

## 2024-10-15 ENCOUNTER — Ambulatory Visit: Admitting: Hematology and Oncology

## 2024-10-15 ENCOUNTER — Other Ambulatory Visit

## 2025-01-17 ENCOUNTER — Ambulatory Visit: Admitting: Neurology
